# Patient Record
Sex: Female | Born: 1955
Health system: Southern US, Community
[De-identification: ages and names within clinical notes are randomized; demographics above are authoritative.]

## PROBLEM LIST (undated history)

## (undated) DIAGNOSIS — R7689 Other specified abnormal immunological findings in serum: Secondary | ICD-10-CM

## (undated) DIAGNOSIS — R768 Other specified abnormal immunological findings in serum: Secondary | ICD-10-CM

## (undated) DIAGNOSIS — R011 Cardiac murmur, unspecified: Secondary | ICD-10-CM

## (undated) DIAGNOSIS — C50919 Malignant neoplasm of unspecified site of unspecified female breast: Secondary | ICD-10-CM

## (undated) DIAGNOSIS — S81809A Unspecified open wound, unspecified lower leg, initial encounter: Secondary | ICD-10-CM

## (undated) DIAGNOSIS — J209 Acute bronchitis, unspecified: Secondary | ICD-10-CM

## (undated) DIAGNOSIS — J329 Chronic sinusitis, unspecified: Secondary | ICD-10-CM

## (undated) DIAGNOSIS — Z9889 Other specified postprocedural states: Secondary | ICD-10-CM

## (undated) DIAGNOSIS — L03119 Cellulitis of unspecified part of limb: Secondary | ICD-10-CM

## (undated) DIAGNOSIS — R220 Localized swelling, mass and lump, head: Secondary | ICD-10-CM

## (undated) DIAGNOSIS — K140 Glossitis: Secondary | ICD-10-CM

## (undated) DIAGNOSIS — K649 Unspecified hemorrhoids: Secondary | ICD-10-CM

## (undated) DIAGNOSIS — S065X9A Traumatic subdural hemorrhage with loss of consciousness of unspecified duration, initial encounter: Secondary | ICD-10-CM

## (undated) DIAGNOSIS — L02419 Cutaneous abscess of limb, unspecified: Secondary | ICD-10-CM

## (undated) DIAGNOSIS — Z86718 Personal history of other venous thrombosis and embolism: Secondary | ICD-10-CM

## (undated) DIAGNOSIS — L0201 Cutaneous abscess of face: Secondary | ICD-10-CM

## (undated) DIAGNOSIS — N83209 Unspecified ovarian cyst, unspecified side: Secondary | ICD-10-CM

## (undated) DIAGNOSIS — I422 Other hypertrophic cardiomyopathy: Secondary | ICD-10-CM

## (undated) DIAGNOSIS — Q078 Other specified congenital malformations of nervous system: Secondary | ICD-10-CM

## (undated) DIAGNOSIS — F988 Other specified behavioral and emotional disorders with onset usually occurring in childhood and adolescence: Secondary | ICD-10-CM

## (undated) DIAGNOSIS — R1013 Epigastric pain: Secondary | ICD-10-CM

## (undated) DIAGNOSIS — R221 Localized swelling, mass and lump, neck: Secondary | ICD-10-CM

## (undated) DIAGNOSIS — S81009A Unspecified open wound, unspecified knee, initial encounter: Secondary | ICD-10-CM

## (undated) DIAGNOSIS — E039 Hypothyroidism, unspecified: Secondary | ICD-10-CM

## (undated) DIAGNOSIS — T783XXA Angioneurotic edema, initial encounter: Secondary | ICD-10-CM

## (undated) DIAGNOSIS — K56609 Unspecified intestinal obstruction, unspecified as to partial versus complete obstruction: Secondary | ICD-10-CM

## (undated) DIAGNOSIS — C569 Malignant neoplasm of unspecified ovary: Secondary | ICD-10-CM

## (undated) DIAGNOSIS — K922 Gastrointestinal hemorrhage, unspecified: Secondary | ICD-10-CM

## (undated) DIAGNOSIS — R55 Syncope and collapse: Secondary | ICD-10-CM

## (undated) DIAGNOSIS — Z9010 Acquired absence of unspecified breast and nipple: Secondary | ICD-10-CM

## (undated) DIAGNOSIS — A90 Dengue fever [classical dengue]: Secondary | ICD-10-CM

## (undated) DIAGNOSIS — R51 Headache: Secondary | ICD-10-CM

## (undated) DIAGNOSIS — K589 Irritable bowel syndrome without diarrhea: Secondary | ICD-10-CM

## (undated) DIAGNOSIS — R079 Chest pain, unspecified: Secondary | ICD-10-CM

## (undated) DIAGNOSIS — Z7901 Long term (current) use of anticoagulants: Secondary | ICD-10-CM

## (undated) DIAGNOSIS — S91009A Unspecified open wound, unspecified ankle, initial encounter: Secondary | ICD-10-CM

## (undated) DIAGNOSIS — Z86010 Personal history of colonic polyps: Secondary | ICD-10-CM

## (undated) DIAGNOSIS — R062 Wheezing: Secondary | ICD-10-CM

## (undated) DIAGNOSIS — L03211 Cellulitis of face: Secondary | ICD-10-CM

## (undated) DIAGNOSIS — S065XAA Traumatic subdural hemorrhage with loss of consciousness status unknown, initial encounter: Secondary | ICD-10-CM

## (undated) DIAGNOSIS — H6980 Other specified disorders of Eustachian tube, unspecified ear: Secondary | ICD-10-CM

## (undated) DIAGNOSIS — K920 Hematemesis: Secondary | ICD-10-CM

## (undated) DIAGNOSIS — J309 Allergic rhinitis, unspecified: Secondary | ICD-10-CM

## (undated) DIAGNOSIS — F411 Generalized anxiety disorder: Secondary | ICD-10-CM

## (undated) DIAGNOSIS — R569 Unspecified convulsions: Secondary | ICD-10-CM

## (undated) DIAGNOSIS — I10 Essential (primary) hypertension: Secondary | ICD-10-CM

## (undated) DIAGNOSIS — C37 Malignant neoplasm of thymus: Secondary | ICD-10-CM

## (undated) DIAGNOSIS — E785 Hyperlipidemia, unspecified: Secondary | ICD-10-CM

## (undated) HISTORY — PX: MITRAL VALVE REPLACEMENT: SHX147

## (undated) HISTORY — PX: OTHER SURGICAL HISTORY: SHX169

## (undated) HISTORY — DX: Unspecified hemorrhoids: K64.9

## (undated) HISTORY — DX: Unspecified convulsions: R56.9

## (undated) HISTORY — DX: Traumatic subdural hemorrhage with loss of consciousness of unspecified duration, initial encounter: S06.5X9A

## (undated) HISTORY — DX: Cellulitis of unspecified part of limb: L03.119

## (undated) HISTORY — DX: Cellulitis of face: L03.211

## (undated) HISTORY — DX: Other specified abnormal immunological findings in serum: R76.89

## (undated) HISTORY — DX: Other specified congenital malformations of nervous system: Q07.8

## (undated) HISTORY — DX: Long term (current) use of anticoagulants: Z79.01

## (undated) HISTORY — DX: Glossitis: K14.0

## (undated) HISTORY — DX: Allergic rhinitis, unspecified: J30.9

## (undated) HISTORY — PX: SPIGELIAN HERNIA: SHX6100

## (undated) HISTORY — PX: CHOLECYSTECTOMY: SHX55

## (undated) HISTORY — DX: Essential (primary) hypertension: I10

## (undated) HISTORY — DX: Other hypertrophic cardiomyopathy: I42.2

## (undated) HISTORY — DX: Other specified disorders of Eustachian tube, unspecified ear: H69.80

## (undated) HISTORY — PX: CARDIAC ELECTROPHYSIOLOGY MAPPING AND ABLATION: SHX1292

## (undated) HISTORY — DX: Chronic sinusitis, unspecified: J32.9

## (undated) HISTORY — DX: Unspecified intestinal obstruction, unspecified as to partial versus complete obstruction: K56.609

## (undated) HISTORY — DX: Malignant neoplasm of thymus: C37

## (undated) HISTORY — DX: Other specified postprocedural states: Z98.890

## (undated) HISTORY — DX: Irritable bowel syndrome without diarrhea: K58.9

## (undated) HISTORY — DX: Traumatic subdural hemorrhage with loss of consciousness status unknown, initial encounter: S06.5XAA

## (undated) HISTORY — DX: Gastrointestinal hemorrhage, unspecified: K92.2

## (undated) HISTORY — DX: Syncope and collapse: R55

## (undated) HISTORY — DX: Angioneurotic edema, initial encounter: T78.3XXA

## (undated) HISTORY — PX: COLONOSCOPY W/ BIOPSIES: SHX1374

## (undated) HISTORY — DX: Localized swelling, mass and lump, head: R22.0

## (undated) HISTORY — DX: Acquired absence of unspecified breast and nipple: Z90.10

## (undated) HISTORY — DX: Hematemesis: K92.0

## (undated) HISTORY — PX: FLEXIBLE SIGMOIDOSCOPY: SHX1649

## (undated) HISTORY — PX: MINIMALLY INVASIVE TRICUSPID VALVE REPAIR: SHX5975

## (undated) HISTORY — PX: BREAST RECONSTRUCTION: SHX9

## (undated) HISTORY — DX: Personal history of other venous thrombosis and embolism: Z86.718

## (undated) HISTORY — DX: Cutaneous abscess of face: L02.01

## (undated) HISTORY — PX: APPENDECTOMY: SHX54

## (undated) HISTORY — DX: Chest pain, unspecified: R07.9

## (undated) HISTORY — DX: Cutaneous abscess of limb, unspecified: L02.419

## (undated) HISTORY — DX: Unspecified open wound, unspecified ankle, initial encounter: S91.009A

## (undated) HISTORY — DX: Malignant neoplasm of unspecified site of unspecified female breast: C50.919

## (undated) HISTORY — DX: Acute bronchitis, unspecified: J20.9

## (undated) HISTORY — PX: OOPHORECTOMY: SHX86

## (undated) HISTORY — DX: Unspecified open wound, unspecified lower leg, initial encounter: S81.809A

## (undated) HISTORY — DX: Epigastric pain: R10.13

## (undated) HISTORY — PX: MASTECTOMY: SHX3

## (undated) HISTORY — DX: Generalized anxiety disorder: F41.1

## (undated) HISTORY — DX: Unspecified open wound, unspecified knee, initial encounter: S81.009A

## (undated) HISTORY — DX: Malignant neoplasm of unspecified ovary: C56.9

## (undated) HISTORY — DX: Hypothyroidism, unspecified: E03.9

## (undated) HISTORY — DX: Wheezing: R06.2

## (undated) HISTORY — DX: Headache: R51

## (undated) HISTORY — DX: Other specified behavioral and emotional disorders with onset usually occurring in childhood and adolescence: F98.8

## (undated) HISTORY — DX: Personal history of colonic polyps: Z86.010

## (undated) HISTORY — DX: Other specified abnormal immunological findings in serum: R76.8

## (undated) HISTORY — DX: Hyperlipidemia, unspecified: E78.5

## (undated) HISTORY — DX: Localized swelling, mass and lump, neck: R22.1

## (undated) HISTORY — DX: Dengue fever (classical dengue): A90

## (undated) HISTORY — PX: ABDOMINAL HYSTERECTOMY: SHX81

## (undated) HISTORY — DX: Unspecified ovarian cyst, unspecified side: N83.209

---

## 1998-09-09 ENCOUNTER — Other Ambulatory Visit: Admission: RE | Admit: 1998-09-09 | Discharge: 1998-09-09 | Payer: Self-pay | Admitting: Obstetrics and Gynecology

## 1999-10-24 ENCOUNTER — Ambulatory Visit (HOSPITAL_COMMUNITY): Admission: RE | Admit: 1999-10-24 | Discharge: 1999-10-24 | Payer: Self-pay | Admitting: Internal Medicine

## 1999-10-24 ENCOUNTER — Encounter: Payer: Self-pay | Admitting: Internal Medicine

## 1999-11-21 ENCOUNTER — Ambulatory Visit (HOSPITAL_COMMUNITY): Admission: RE | Admit: 1999-11-21 | Discharge: 1999-11-21 | Payer: Self-pay | Admitting: Internal Medicine

## 1999-11-21 ENCOUNTER — Encounter: Payer: Self-pay | Admitting: Internal Medicine

## 2000-02-28 ENCOUNTER — Encounter (INDEPENDENT_AMBULATORY_CARE_PROVIDER_SITE_OTHER): Payer: Self-pay | Admitting: Specialist

## 2000-02-28 ENCOUNTER — Other Ambulatory Visit: Admission: RE | Admit: 2000-02-28 | Discharge: 2000-02-28 | Payer: Self-pay | Admitting: Plastic Surgery

## 2001-02-11 ENCOUNTER — Emergency Department (HOSPITAL_COMMUNITY): Admission: EM | Admit: 2001-02-11 | Discharge: 2001-02-11 | Payer: Self-pay | Admitting: Emergency Medicine

## 2001-02-27 ENCOUNTER — Other Ambulatory Visit: Admission: RE | Admit: 2001-02-27 | Discharge: 2001-02-27 | Payer: Self-pay | Admitting: Obstetrics and Gynecology

## 2002-04-28 ENCOUNTER — Other Ambulatory Visit: Admission: RE | Admit: 2002-04-28 | Discharge: 2002-04-28 | Payer: Self-pay | Admitting: Obstetrics and Gynecology

## 2002-06-10 ENCOUNTER — Encounter: Payer: Self-pay | Admitting: Internal Medicine

## 2002-06-10 ENCOUNTER — Inpatient Hospital Stay (HOSPITAL_COMMUNITY): Admission: EM | Admit: 2002-06-10 | Discharge: 2002-06-17 | Payer: Self-pay | Admitting: Emergency Medicine

## 2002-06-10 ENCOUNTER — Encounter: Payer: Self-pay | Admitting: Emergency Medicine

## 2002-06-11 ENCOUNTER — Encounter: Payer: Self-pay | Admitting: Internal Medicine

## 2002-06-11 ENCOUNTER — Encounter (INDEPENDENT_AMBULATORY_CARE_PROVIDER_SITE_OTHER): Payer: Self-pay | Admitting: *Deleted

## 2002-06-13 ENCOUNTER — Encounter: Payer: Self-pay | Admitting: General Surgery

## 2002-11-23 ENCOUNTER — Emergency Department (HOSPITAL_COMMUNITY): Admission: EM | Admit: 2002-11-23 | Discharge: 2002-11-23 | Payer: Self-pay | Admitting: Emergency Medicine

## 2002-12-29 ENCOUNTER — Ambulatory Visit (HOSPITAL_COMMUNITY): Admission: RE | Admit: 2002-12-29 | Discharge: 2002-12-29 | Payer: Self-pay | Admitting: Internal Medicine

## 2002-12-31 ENCOUNTER — Inpatient Hospital Stay (HOSPITAL_COMMUNITY): Admission: AD | Admit: 2002-12-31 | Discharge: 2003-01-02 | Payer: Self-pay | Admitting: Cardiology

## 2003-01-03 HISTORY — PX: COLON RESECTION: SHX5231

## 2003-03-03 ENCOUNTER — Encounter: Admission: RE | Admit: 2003-03-03 | Discharge: 2003-03-03 | Payer: Self-pay | Admitting: Internal Medicine

## 2003-06-01 ENCOUNTER — Emergency Department (HOSPITAL_COMMUNITY): Admission: EM | Admit: 2003-06-01 | Discharge: 2003-06-01 | Payer: Self-pay | Admitting: *Deleted

## 2003-06-18 ENCOUNTER — Ambulatory Visit (HOSPITAL_COMMUNITY): Admission: RE | Admit: 2003-06-18 | Discharge: 2003-06-18 | Payer: Self-pay | Admitting: Internal Medicine

## 2003-09-10 ENCOUNTER — Emergency Department (HOSPITAL_COMMUNITY): Admission: EM | Admit: 2003-09-10 | Discharge: 2003-09-10 | Payer: Self-pay | Admitting: Emergency Medicine

## 2003-09-11 ENCOUNTER — Ambulatory Visit (HOSPITAL_COMMUNITY): Admission: RE | Admit: 2003-09-11 | Discharge: 2003-09-11 | Payer: Self-pay | Admitting: Internal Medicine

## 2003-12-07 ENCOUNTER — Ambulatory Visit: Payer: Self-pay | Admitting: Internal Medicine

## 2004-01-28 ENCOUNTER — Ambulatory Visit: Payer: Self-pay | Admitting: Internal Medicine

## 2004-01-29 ENCOUNTER — Encounter: Admission: RE | Admit: 2004-01-29 | Discharge: 2004-01-29 | Payer: Self-pay | Admitting: Internal Medicine

## 2004-01-31 ENCOUNTER — Emergency Department (HOSPITAL_COMMUNITY): Admission: EM | Admit: 2004-01-31 | Discharge: 2004-02-01 | Payer: Self-pay | Admitting: Emergency Medicine

## 2004-02-01 ENCOUNTER — Ambulatory Visit: Payer: Self-pay | Admitting: Internal Medicine

## 2004-02-01 ENCOUNTER — Inpatient Hospital Stay (HOSPITAL_COMMUNITY): Admission: EM | Admit: 2004-02-01 | Discharge: 2004-02-04 | Payer: Self-pay | Admitting: Internal Medicine

## 2004-02-04 ENCOUNTER — Encounter (INDEPENDENT_AMBULATORY_CARE_PROVIDER_SITE_OTHER): Payer: Self-pay | Admitting: Specialist

## 2004-04-30 ENCOUNTER — Emergency Department (HOSPITAL_COMMUNITY): Admission: EM | Admit: 2004-04-30 | Discharge: 2004-04-30 | Payer: Self-pay | Admitting: Emergency Medicine

## 2004-05-05 ENCOUNTER — Ambulatory Visit: Payer: Self-pay | Admitting: Internal Medicine

## 2004-06-21 ENCOUNTER — Emergency Department (HOSPITAL_COMMUNITY): Admission: EM | Admit: 2004-06-21 | Discharge: 2004-06-21 | Payer: Self-pay | Admitting: Emergency Medicine

## 2004-06-23 ENCOUNTER — Ambulatory Visit: Payer: Self-pay | Admitting: Internal Medicine

## 2004-06-28 ENCOUNTER — Ambulatory Visit (HOSPITAL_COMMUNITY): Admission: RE | Admit: 2004-06-28 | Discharge: 2004-06-28 | Payer: Self-pay | Admitting: Internal Medicine

## 2004-06-30 ENCOUNTER — Ambulatory Visit: Payer: Self-pay | Admitting: Internal Medicine

## 2004-07-07 ENCOUNTER — Encounter (HOSPITAL_COMMUNITY): Admission: RE | Admit: 2004-07-07 | Discharge: 2004-10-05 | Payer: Self-pay | Admitting: Internal Medicine

## 2004-07-11 ENCOUNTER — Ambulatory Visit: Payer: Self-pay | Admitting: Internal Medicine

## 2004-08-09 ENCOUNTER — Ambulatory Visit (HOSPITAL_COMMUNITY): Admission: RE | Admit: 2004-08-09 | Discharge: 2004-08-09 | Payer: Self-pay | Admitting: Internal Medicine

## 2004-08-31 ENCOUNTER — Ambulatory Visit: Payer: Self-pay | Admitting: Internal Medicine

## 2004-09-08 ENCOUNTER — Emergency Department (HOSPITAL_COMMUNITY): Admission: EM | Admit: 2004-09-08 | Discharge: 2004-09-09 | Payer: Self-pay | Admitting: Emergency Medicine

## 2004-09-26 ENCOUNTER — Ambulatory Visit: Payer: Self-pay | Admitting: Internal Medicine

## 2004-10-05 ENCOUNTER — Ambulatory Visit: Payer: Self-pay | Admitting: Internal Medicine

## 2004-10-11 ENCOUNTER — Ambulatory Visit (HOSPITAL_COMMUNITY): Admission: RE | Admit: 2004-10-11 | Discharge: 2004-10-11 | Payer: Self-pay | Admitting: Internal Medicine

## 2004-10-21 ENCOUNTER — Ambulatory Visit: Payer: Self-pay | Admitting: Internal Medicine

## 2004-12-13 ENCOUNTER — Ambulatory Visit: Payer: Self-pay | Admitting: Internal Medicine

## 2005-01-04 ENCOUNTER — Ambulatory Visit: Payer: Self-pay | Admitting: Gastroenterology

## 2005-01-04 ENCOUNTER — Inpatient Hospital Stay (HOSPITAL_COMMUNITY): Admission: EM | Admit: 2005-01-04 | Discharge: 2005-01-06 | Payer: Self-pay | Admitting: Emergency Medicine

## 2005-01-05 ENCOUNTER — Encounter (INDEPENDENT_AMBULATORY_CARE_PROVIDER_SITE_OTHER): Payer: Self-pay | Admitting: *Deleted

## 2005-01-05 ENCOUNTER — Encounter: Payer: Self-pay | Admitting: Internal Medicine

## 2005-01-12 ENCOUNTER — Ambulatory Visit: Payer: Self-pay | Admitting: Internal Medicine

## 2005-02-01 ENCOUNTER — Ambulatory Visit: Payer: Self-pay | Admitting: Internal Medicine

## 2005-04-28 ENCOUNTER — Ambulatory Visit (HOSPITAL_COMMUNITY): Admission: RE | Admit: 2005-04-28 | Discharge: 2005-04-28 | Payer: Self-pay | Admitting: Internal Medicine

## 2005-04-28 ENCOUNTER — Ambulatory Visit: Payer: Self-pay | Admitting: Internal Medicine

## 2005-05-30 ENCOUNTER — Ambulatory Visit: Payer: Self-pay | Admitting: Internal Medicine

## 2005-05-30 ENCOUNTER — Encounter: Admission: RE | Admit: 2005-05-30 | Discharge: 2005-05-30 | Payer: Self-pay | Admitting: Internal Medicine

## 2005-06-08 ENCOUNTER — Ambulatory Visit: Payer: Self-pay | Admitting: Internal Medicine

## 2005-06-15 ENCOUNTER — Ambulatory Visit: Payer: Self-pay | Admitting: Internal Medicine

## 2005-07-07 ENCOUNTER — Ambulatory Visit (HOSPITAL_COMMUNITY): Admission: RE | Admit: 2005-07-07 | Discharge: 2005-07-07 | Payer: Self-pay | Admitting: Internal Medicine

## 2005-08-03 ENCOUNTER — Ambulatory Visit: Payer: Self-pay | Admitting: Internal Medicine

## 2005-08-30 ENCOUNTER — Inpatient Hospital Stay (HOSPITAL_COMMUNITY): Admission: EM | Admit: 2005-08-30 | Discharge: 2005-09-02 | Payer: Self-pay | Admitting: Emergency Medicine

## 2005-08-30 ENCOUNTER — Ambulatory Visit: Payer: Self-pay | Admitting: Internal Medicine

## 2005-09-05 ENCOUNTER — Ambulatory Visit: Payer: Self-pay | Admitting: Internal Medicine

## 2005-10-05 ENCOUNTER — Ambulatory Visit: Payer: Self-pay | Admitting: Internal Medicine

## 2005-11-02 ENCOUNTER — Inpatient Hospital Stay (HOSPITAL_COMMUNITY): Admission: EM | Admit: 2005-11-02 | Discharge: 2005-11-06 | Payer: Self-pay | Admitting: Emergency Medicine

## 2005-11-05 ENCOUNTER — Encounter: Payer: Self-pay | Admitting: Gastroenterology

## 2005-11-06 ENCOUNTER — Ambulatory Visit: Payer: Self-pay | Admitting: Internal Medicine

## 2005-11-08 ENCOUNTER — Ambulatory Visit: Payer: Self-pay | Admitting: Internal Medicine

## 2005-12-01 ENCOUNTER — Inpatient Hospital Stay (HOSPITAL_COMMUNITY): Admission: EM | Admit: 2005-12-01 | Discharge: 2005-12-03 | Payer: Self-pay | Admitting: Emergency Medicine

## 2005-12-01 ENCOUNTER — Encounter (INDEPENDENT_AMBULATORY_CARE_PROVIDER_SITE_OTHER): Payer: Self-pay | Admitting: *Deleted

## 2005-12-01 ENCOUNTER — Ambulatory Visit (HOSPITAL_COMMUNITY): Admission: RE | Admit: 2005-12-01 | Discharge: 2005-12-01 | Payer: Self-pay | Admitting: General Surgery

## 2005-12-01 ENCOUNTER — Ambulatory Visit: Payer: Self-pay | Admitting: Internal Medicine

## 2005-12-22 ENCOUNTER — Ambulatory Visit: Payer: Self-pay | Admitting: Internal Medicine

## 2006-01-20 ENCOUNTER — Ambulatory Visit: Payer: Self-pay | Admitting: Family Medicine

## 2006-03-13 ENCOUNTER — Ambulatory Visit: Payer: Self-pay | Admitting: Internal Medicine

## 2006-04-02 ENCOUNTER — Ambulatory Visit: Payer: Self-pay | Admitting: Internal Medicine

## 2006-04-10 ENCOUNTER — Ambulatory Visit: Payer: Self-pay | Admitting: Internal Medicine

## 2006-04-12 ENCOUNTER — Inpatient Hospital Stay (HOSPITAL_COMMUNITY): Admission: AD | Admit: 2006-04-12 | Discharge: 2006-04-15 | Payer: Self-pay | Admitting: Internal Medicine

## 2006-04-12 ENCOUNTER — Ambulatory Visit: Payer: Self-pay | Admitting: Internal Medicine

## 2006-04-14 ENCOUNTER — Ambulatory Visit: Payer: Self-pay | Admitting: Internal Medicine

## 2006-05-10 ENCOUNTER — Ambulatory Visit: Payer: Self-pay | Admitting: Internal Medicine

## 2006-05-20 ENCOUNTER — Emergency Department (HOSPITAL_COMMUNITY): Admission: EM | Admit: 2006-05-20 | Discharge: 2006-05-20 | Payer: Self-pay | Admitting: Emergency Medicine

## 2006-05-20 ENCOUNTER — Encounter (INDEPENDENT_AMBULATORY_CARE_PROVIDER_SITE_OTHER): Payer: Self-pay | Admitting: Emergency Medicine

## 2006-05-20 ENCOUNTER — Ambulatory Visit: Payer: Self-pay | Admitting: Vascular Surgery

## 2006-05-21 ENCOUNTER — Ambulatory Visit: Payer: Self-pay | Admitting: Internal Medicine

## 2006-05-23 ENCOUNTER — Ambulatory Visit (HOSPITAL_COMMUNITY): Admission: RE | Admit: 2006-05-23 | Discharge: 2006-05-23 | Payer: Self-pay | Admitting: Internal Medicine

## 2006-05-24 ENCOUNTER — Ambulatory Visit: Payer: Self-pay | Admitting: Internal Medicine

## 2006-05-24 ENCOUNTER — Observation Stay (HOSPITAL_COMMUNITY): Admission: EM | Admit: 2006-05-24 | Discharge: 2006-05-25 | Payer: Self-pay | Admitting: Emergency Medicine

## 2006-05-29 ENCOUNTER — Ambulatory Visit: Payer: Self-pay | Admitting: Cardiology

## 2006-05-29 LAB — CONVERTED CEMR LAB
INR: 1.1 (ref 0.9–2.0)
Prothrombin Time: 12.6 s (ref 10.0–14.0)

## 2006-06-01 ENCOUNTER — Ambulatory Visit: Payer: Self-pay | Admitting: Internal Medicine

## 2006-06-01 ENCOUNTER — Ambulatory Visit: Payer: Self-pay | Admitting: Cardiology

## 2006-06-01 LAB — CONVERTED CEMR LAB
INR: 1.1
Prothrombin Time: 12.8 s

## 2006-06-06 ENCOUNTER — Ambulatory Visit: Payer: Self-pay | Admitting: Internal Medicine

## 2006-06-07 ENCOUNTER — Ambulatory Visit: Payer: Self-pay | Admitting: Internal Medicine

## 2006-06-26 ENCOUNTER — Ambulatory Visit: Payer: Self-pay | Admitting: Internal Medicine

## 2006-07-04 ENCOUNTER — Ambulatory Visit: Payer: Self-pay | Admitting: Internal Medicine

## 2006-07-07 ENCOUNTER — Ambulatory Visit: Payer: Self-pay | Admitting: Family Medicine

## 2006-07-26 ENCOUNTER — Ambulatory Visit: Payer: Self-pay | Admitting: Internal Medicine

## 2006-07-26 LAB — CONVERTED CEMR LAB
Basophils Absolute: 0.1 10*3/uL (ref 0.0–0.1)
Eosinophils Absolute: 0.2 10*3/uL (ref 0.0–0.6)
Eosinophils Relative: 4.8 % (ref 0.0–5.0)
Lymphocytes Relative: 33.2 % (ref 12.0–46.0)
MCHC: 34.9 g/dL (ref 30.0–36.0)
MCV: 89.6 fL (ref 78.0–100.0)
Monocytes Relative: 6.6 % (ref 3.0–11.0)
Neutro Abs: 2.3 10*3/uL (ref 1.4–7.7)
Platelets: 236 10*3/uL (ref 150–400)

## 2006-07-31 ENCOUNTER — Ambulatory Visit: Payer: Self-pay | Admitting: Internal Medicine

## 2006-08-21 ENCOUNTER — Ambulatory Visit: Payer: Self-pay | Admitting: Internal Medicine

## 2006-08-29 ENCOUNTER — Ambulatory Visit: Payer: Self-pay | Admitting: Internal Medicine

## 2006-08-29 LAB — CONVERTED CEMR LAB
Basophils Relative: 0.8 % (ref 0.0–1.0)
Eosinophils Absolute: 0.2 10*3/uL (ref 0.0–0.6)
Eosinophils Relative: 4.3 % (ref 0.0–5.0)
HCT: 37.3 % (ref 36.0–46.0)
MCHC: 35 g/dL (ref 30.0–36.0)
Monocytes Absolute: 0.4 10*3/uL (ref 0.2–0.7)
Neutro Abs: 2.9 10*3/uL (ref 1.4–7.7)
RBC: 4.21 M/uL (ref 3.87–5.11)
WBC: 5.4 10*3/uL (ref 4.5–10.5)

## 2006-09-26 ENCOUNTER — Ambulatory Visit: Payer: Self-pay | Admitting: Internal Medicine

## 2006-09-28 ENCOUNTER — Ambulatory Visit: Payer: Self-pay | Admitting: Internal Medicine

## 2006-10-09 ENCOUNTER — Encounter: Admission: RE | Admit: 2006-10-09 | Discharge: 2006-10-09 | Payer: Self-pay | Admitting: Internal Medicine

## 2006-10-15 ENCOUNTER — Encounter: Payer: Self-pay | Admitting: Internal Medicine

## 2006-10-15 ENCOUNTER — Ambulatory Visit: Payer: Self-pay | Admitting: Internal Medicine

## 2006-10-15 DIAGNOSIS — J309 Allergic rhinitis, unspecified: Secondary | ICD-10-CM | POA: Insufficient documentation

## 2006-10-15 DIAGNOSIS — N83209 Unspecified ovarian cyst, unspecified side: Secondary | ICD-10-CM

## 2006-10-15 DIAGNOSIS — Z860100 Personal history of colon polyps, unspecified: Secondary | ICD-10-CM

## 2006-10-15 DIAGNOSIS — F411 Generalized anxiety disorder: Secondary | ICD-10-CM

## 2006-10-15 DIAGNOSIS — Z86718 Personal history of other venous thrombosis and embolism: Secondary | ICD-10-CM | POA: Insufficient documentation

## 2006-10-15 DIAGNOSIS — C50919 Malignant neoplasm of unspecified site of unspecified female breast: Secondary | ICD-10-CM

## 2006-10-15 DIAGNOSIS — I10 Essential (primary) hypertension: Secondary | ICD-10-CM | POA: Insufficient documentation

## 2006-10-15 DIAGNOSIS — Q078 Other specified congenital malformations of nervous system: Secondary | ICD-10-CM | POA: Insufficient documentation

## 2006-10-15 DIAGNOSIS — Z9889 Other specified postprocedural states: Secondary | ICD-10-CM

## 2006-10-15 DIAGNOSIS — C37 Malignant neoplasm of thymus: Secondary | ICD-10-CM | POA: Insufficient documentation

## 2006-10-15 DIAGNOSIS — Z8719 Personal history of other diseases of the digestive system: Secondary | ICD-10-CM | POA: Insufficient documentation

## 2006-10-15 DIAGNOSIS — E65 Localized adiposity: Secondary | ICD-10-CM | POA: Insufficient documentation

## 2006-10-15 DIAGNOSIS — F329 Major depressive disorder, single episode, unspecified: Secondary | ICD-10-CM

## 2006-10-15 DIAGNOSIS — F32A Depression, unspecified: Secondary | ICD-10-CM | POA: Insufficient documentation

## 2006-10-15 DIAGNOSIS — J019 Acute sinusitis, unspecified: Secondary | ICD-10-CM | POA: Insufficient documentation

## 2006-10-15 HISTORY — DX: Malignant neoplasm of thymus: C37

## 2006-10-15 HISTORY — DX: Other specified congenital malformations of nervous system: Q07.8

## 2006-10-15 HISTORY — DX: Malignant neoplasm of unspecified site of unspecified female breast: C50.919

## 2006-10-15 HISTORY — DX: Allergic rhinitis, unspecified: J30.9

## 2006-10-15 HISTORY — DX: Other specified postprocedural states: Z98.890

## 2006-10-15 HISTORY — DX: Generalized anxiety disorder: F41.1

## 2006-10-15 HISTORY — DX: Personal history of colonic polyps: Z86.010

## 2006-10-15 HISTORY — DX: Personal history of colon polyps, unspecified: Z86.0100

## 2006-10-15 HISTORY — DX: Essential (primary) hypertension: I10

## 2006-10-15 HISTORY — DX: Personal history of other venous thrombosis and embolism: Z86.718

## 2006-10-15 HISTORY — DX: Unspecified ovarian cyst, unspecified side: N83.209

## 2006-10-20 ENCOUNTER — Emergency Department (HOSPITAL_COMMUNITY): Admission: EM | Admit: 2006-10-20 | Discharge: 2006-10-20 | Payer: Self-pay | Admitting: *Deleted

## 2006-10-22 ENCOUNTER — Ambulatory Visit: Payer: Self-pay | Admitting: Internal Medicine

## 2006-10-22 LAB — CONVERTED CEMR LAB
ALT: 23 units/L (ref 0–35)
Bilirubin, Direct: 0.1 mg/dL (ref 0.0–0.3)
CO2: 27 meq/L (ref 19–32)
Calcium: 9.6 mg/dL (ref 8.4–10.5)
GFR calc Af Amer: 75 mL/min
GFR calc non Af Amer: 62 mL/min
Glucose, Bld: 104 mg/dL — ABNORMAL HIGH (ref 70–99)
H Pylori IgG: NEGATIVE
Lipase: 18 units/L (ref 11.0–59.0)
Potassium: 4.6 meq/L (ref 3.5–5.1)
Total Protein: 6.5 g/dL (ref 6.0–8.3)

## 2006-10-23 ENCOUNTER — Encounter: Payer: Self-pay | Admitting: Internal Medicine

## 2006-10-23 DIAGNOSIS — R1011 Right upper quadrant pain: Secondary | ICD-10-CM | POA: Insufficient documentation

## 2006-10-25 ENCOUNTER — Ambulatory Visit (HOSPITAL_COMMUNITY): Admission: RE | Admit: 2006-10-25 | Discharge: 2006-10-25 | Payer: Self-pay | Admitting: Internal Medicine

## 2006-11-08 ENCOUNTER — Telehealth (INDEPENDENT_AMBULATORY_CARE_PROVIDER_SITE_OTHER): Payer: Self-pay | Admitting: *Deleted

## 2006-11-12 ENCOUNTER — Encounter: Payer: Self-pay | Admitting: Internal Medicine

## 2006-11-20 ENCOUNTER — Ambulatory Visit: Payer: Self-pay | Admitting: Internal Medicine

## 2006-11-20 DIAGNOSIS — R1031 Right lower quadrant pain: Secondary | ICD-10-CM | POA: Insufficient documentation

## 2006-12-03 ENCOUNTER — Observation Stay (HOSPITAL_COMMUNITY): Admission: EM | Admit: 2006-12-03 | Discharge: 2006-12-05 | Payer: Self-pay | Admitting: Emergency Medicine

## 2006-12-03 ENCOUNTER — Ambulatory Visit: Payer: Self-pay | Admitting: Cardiology

## 2006-12-04 ENCOUNTER — Encounter: Payer: Self-pay | Admitting: Internal Medicine

## 2006-12-04 ENCOUNTER — Encounter: Payer: Self-pay | Admitting: Cardiology

## 2006-12-13 ENCOUNTER — Ambulatory Visit: Payer: Self-pay | Admitting: Internal Medicine

## 2006-12-21 ENCOUNTER — Telehealth: Payer: Self-pay | Admitting: Internal Medicine

## 2006-12-24 ENCOUNTER — Ambulatory Visit (HOSPITAL_COMMUNITY): Admission: RE | Admit: 2006-12-24 | Discharge: 2006-12-24 | Payer: Self-pay | Admitting: Internal Medicine

## 2006-12-29 ENCOUNTER — Encounter: Payer: Self-pay | Admitting: Internal Medicine

## 2006-12-31 ENCOUNTER — Telehealth (INDEPENDENT_AMBULATORY_CARE_PROVIDER_SITE_OTHER): Payer: Self-pay | Admitting: *Deleted

## 2007-01-15 ENCOUNTER — Telehealth (INDEPENDENT_AMBULATORY_CARE_PROVIDER_SITE_OTHER): Payer: Self-pay | Admitting: *Deleted

## 2007-01-17 ENCOUNTER — Ambulatory Visit: Payer: Self-pay | Admitting: Internal Medicine

## 2007-01-17 DIAGNOSIS — R42 Dizziness and giddiness: Secondary | ICD-10-CM | POA: Insufficient documentation

## 2007-01-17 DIAGNOSIS — G238 Other specified degenerative diseases of basal ganglia: Secondary | ICD-10-CM | POA: Insufficient documentation

## 2007-01-21 ENCOUNTER — Encounter
Admission: RE | Admit: 2007-01-21 | Discharge: 2007-01-21 | Payer: Self-pay | Admitting: Physical Medicine & Rehabilitation

## 2007-02-04 ENCOUNTER — Ambulatory Visit: Payer: Self-pay | Admitting: Internal Medicine

## 2007-02-11 ENCOUNTER — Encounter: Payer: Self-pay | Admitting: Internal Medicine

## 2007-02-19 ENCOUNTER — Encounter: Payer: Self-pay | Admitting: Internal Medicine

## 2007-02-23 ENCOUNTER — Telehealth: Payer: Self-pay | Admitting: Internal Medicine

## 2007-03-07 ENCOUNTER — Telehealth: Payer: Self-pay | Admitting: Internal Medicine

## 2007-03-08 ENCOUNTER — Ambulatory Visit: Payer: Self-pay | Admitting: Internal Medicine

## 2007-03-11 ENCOUNTER — Encounter: Payer: Self-pay | Admitting: Internal Medicine

## 2007-03-18 ENCOUNTER — Telehealth (INDEPENDENT_AMBULATORY_CARE_PROVIDER_SITE_OTHER): Payer: Self-pay | Admitting: *Deleted

## 2007-03-19 ENCOUNTER — Ambulatory Visit: Payer: Self-pay | Admitting: Internal Medicine

## 2007-03-22 ENCOUNTER — Ambulatory Visit: Payer: Self-pay | Admitting: Internal Medicine

## 2007-04-04 DIAGNOSIS — K589 Irritable bowel syndrome without diarrhea: Secondary | ICD-10-CM | POA: Insufficient documentation

## 2007-04-04 DIAGNOSIS — R519 Headache, unspecified: Secondary | ICD-10-CM | POA: Insufficient documentation

## 2007-04-04 DIAGNOSIS — D126 Benign neoplasm of colon, unspecified: Secondary | ICD-10-CM | POA: Insufficient documentation

## 2007-04-04 DIAGNOSIS — K922 Gastrointestinal hemorrhage, unspecified: Secondary | ICD-10-CM

## 2007-04-04 DIAGNOSIS — K625 Hemorrhage of anus and rectum: Secondary | ICD-10-CM | POA: Insufficient documentation

## 2007-04-04 DIAGNOSIS — K649 Unspecified hemorrhoids: Secondary | ICD-10-CM

## 2007-04-04 DIAGNOSIS — R51 Headache: Secondary | ICD-10-CM

## 2007-04-04 DIAGNOSIS — I82409 Acute embolism and thrombosis of unspecified deep veins of unspecified lower extremity: Secondary | ICD-10-CM | POA: Insufficient documentation

## 2007-04-04 DIAGNOSIS — F341 Dysthymic disorder: Secondary | ICD-10-CM | POA: Insufficient documentation

## 2007-04-04 HISTORY — DX: Gastrointestinal hemorrhage, unspecified: K92.2

## 2007-04-04 HISTORY — DX: Headache: R51

## 2007-04-04 HISTORY — DX: Unspecified hemorrhoids: K64.9

## 2007-04-04 HISTORY — DX: Irritable bowel syndrome, unspecified: K58.9

## 2007-05-03 ENCOUNTER — Ambulatory Visit: Payer: Self-pay | Admitting: Internal Medicine

## 2007-05-06 ENCOUNTER — Ambulatory Visit: Payer: Self-pay | Admitting: Internal Medicine

## 2007-05-22 ENCOUNTER — Telehealth (INDEPENDENT_AMBULATORY_CARE_PROVIDER_SITE_OTHER): Payer: Self-pay | Admitting: *Deleted

## 2007-05-29 ENCOUNTER — Ambulatory Visit: Payer: Self-pay | Admitting: Internal Medicine

## 2007-06-12 ENCOUNTER — Ambulatory Visit: Payer: Self-pay | Admitting: *Deleted

## 2007-06-12 ENCOUNTER — Emergency Department (HOSPITAL_COMMUNITY): Admission: EM | Admit: 2007-06-12 | Discharge: 2007-06-12 | Payer: Self-pay | Admitting: Emergency Medicine

## 2007-06-12 ENCOUNTER — Ambulatory Visit: Payer: Self-pay | Admitting: Cardiovascular Disease

## 2007-06-12 ENCOUNTER — Encounter (INDEPENDENT_AMBULATORY_CARE_PROVIDER_SITE_OTHER): Payer: Self-pay | Admitting: Emergency Medicine

## 2007-06-13 ENCOUNTER — Telehealth (INDEPENDENT_AMBULATORY_CARE_PROVIDER_SITE_OTHER): Payer: Self-pay | Admitting: *Deleted

## 2007-06-14 ENCOUNTER — Ambulatory Visit: Payer: Self-pay | Admitting: Internal Medicine

## 2007-06-14 DIAGNOSIS — R079 Chest pain, unspecified: Secondary | ICD-10-CM | POA: Insufficient documentation

## 2007-06-14 HISTORY — DX: Chest pain, unspecified: R07.9

## 2007-06-27 ENCOUNTER — Encounter: Payer: Self-pay | Admitting: Internal Medicine

## 2007-06-28 ENCOUNTER — Ambulatory Visit: Payer: Self-pay | Admitting: Internal Medicine

## 2007-07-01 ENCOUNTER — Ambulatory Visit: Payer: Self-pay | Admitting: Internal Medicine

## 2007-07-01 DIAGNOSIS — R220 Localized swelling, mass and lump, head: Secondary | ICD-10-CM

## 2007-07-01 DIAGNOSIS — R221 Localized swelling, mass and lump, neck: Secondary | ICD-10-CM

## 2007-07-01 DIAGNOSIS — R55 Syncope and collapse: Secondary | ICD-10-CM

## 2007-07-01 HISTORY — DX: Localized swelling, mass and lump, head: R22.0

## 2007-07-01 HISTORY — DX: Syncope and collapse: R55

## 2007-07-03 ENCOUNTER — Ambulatory Visit: Payer: Self-pay | Admitting: Cardiology

## 2007-07-09 ENCOUNTER — Ambulatory Visit: Payer: Self-pay | Admitting: Internal Medicine

## 2007-07-16 ENCOUNTER — Ambulatory Visit: Payer: Self-pay | Admitting: Internal Medicine

## 2007-07-16 ENCOUNTER — Ambulatory Visit: Payer: Self-pay

## 2007-07-16 ENCOUNTER — Encounter: Payer: Self-pay | Admitting: Internal Medicine

## 2007-07-16 LAB — CONVERTED CEMR LAB
Cholesterol: 247 mg/dL (ref 0–200)
Triglycerides: 305 mg/dL (ref 0–149)

## 2007-07-17 ENCOUNTER — Ambulatory Visit: Payer: Self-pay | Admitting: Internal Medicine

## 2007-07-19 ENCOUNTER — Telehealth (INDEPENDENT_AMBULATORY_CARE_PROVIDER_SITE_OTHER): Payer: Self-pay | Admitting: *Deleted

## 2007-07-25 ENCOUNTER — Encounter: Payer: Self-pay | Admitting: Internal Medicine

## 2007-07-26 ENCOUNTER — Telehealth (INDEPENDENT_AMBULATORY_CARE_PROVIDER_SITE_OTHER): Payer: Self-pay | Admitting: *Deleted

## 2007-07-26 ENCOUNTER — Ambulatory Visit: Payer: Self-pay | Admitting: Internal Medicine

## 2007-07-29 ENCOUNTER — Ambulatory Visit: Payer: Self-pay | Admitting: Internal Medicine

## 2007-07-29 DIAGNOSIS — J069 Acute upper respiratory infection, unspecified: Secondary | ICD-10-CM | POA: Insufficient documentation

## 2007-08-10 ENCOUNTER — Telehealth: Payer: Self-pay | Admitting: Internal Medicine

## 2007-08-11 ENCOUNTER — Emergency Department (HOSPITAL_COMMUNITY): Admission: EM | Admit: 2007-08-11 | Discharge: 2007-08-11 | Payer: Self-pay | Admitting: Family Medicine

## 2007-08-11 ENCOUNTER — Telehealth: Payer: Self-pay | Admitting: Internal Medicine

## 2007-08-12 ENCOUNTER — Ambulatory Visit: Payer: Self-pay | Admitting: Internal Medicine

## 2007-08-12 ENCOUNTER — Inpatient Hospital Stay (HOSPITAL_COMMUNITY): Admission: AD | Admit: 2007-08-12 | Discharge: 2007-08-14 | Payer: Self-pay | Admitting: Internal Medicine

## 2007-08-12 DIAGNOSIS — L03119 Cellulitis of unspecified part of limb: Secondary | ICD-10-CM

## 2007-08-12 DIAGNOSIS — L02419 Cutaneous abscess of limb, unspecified: Secondary | ICD-10-CM

## 2007-08-12 HISTORY — DX: Cellulitis of unspecified part of limb: L02.419

## 2007-08-16 ENCOUNTER — Ambulatory Visit: Payer: Self-pay | Admitting: Internal Medicine

## 2007-08-16 DIAGNOSIS — S91009A Unspecified open wound, unspecified ankle, initial encounter: Secondary | ICD-10-CM

## 2007-08-16 DIAGNOSIS — S81009A Unspecified open wound, unspecified knee, initial encounter: Secondary | ICD-10-CM | POA: Insufficient documentation

## 2007-08-16 DIAGNOSIS — S81809A Unspecified open wound, unspecified lower leg, initial encounter: Secondary | ICD-10-CM | POA: Insufficient documentation

## 2007-08-16 DIAGNOSIS — H9209 Otalgia, unspecified ear: Secondary | ICD-10-CM | POA: Insufficient documentation

## 2007-08-16 HISTORY — DX: Unspecified open wound, unspecified knee, initial encounter: S81.009A

## 2007-08-20 ENCOUNTER — Encounter: Payer: Self-pay | Admitting: Internal Medicine

## 2007-08-20 ENCOUNTER — Telehealth: Payer: Self-pay | Admitting: Internal Medicine

## 2007-09-16 ENCOUNTER — Encounter: Payer: Self-pay | Admitting: Internal Medicine

## 2007-10-02 ENCOUNTER — Ambulatory Visit: Payer: Self-pay | Admitting: Internal Medicine

## 2007-10-07 ENCOUNTER — Telehealth (INDEPENDENT_AMBULATORY_CARE_PROVIDER_SITE_OTHER): Payer: Self-pay | Admitting: *Deleted

## 2007-10-15 ENCOUNTER — Ambulatory Visit: Payer: Self-pay | Admitting: Obstetrics and Gynecology

## 2007-10-15 ENCOUNTER — Other Ambulatory Visit: Admission: RE | Admit: 2007-10-15 | Discharge: 2007-10-15 | Payer: Self-pay | Admitting: Obstetrics and Gynecology

## 2007-10-15 ENCOUNTER — Encounter: Payer: Self-pay | Admitting: Obstetrics and Gynecology

## 2007-10-18 ENCOUNTER — Ambulatory Visit: Payer: Self-pay | Admitting: Internal Medicine

## 2007-10-24 ENCOUNTER — Telehealth (INDEPENDENT_AMBULATORY_CARE_PROVIDER_SITE_OTHER): Payer: Self-pay | Admitting: *Deleted

## 2007-10-24 ENCOUNTER — Telehealth: Payer: Self-pay | Admitting: Internal Medicine

## 2007-10-25 ENCOUNTER — Ambulatory Visit: Payer: Self-pay | Admitting: Internal Medicine

## 2007-10-25 DIAGNOSIS — K920 Hematemesis: Secondary | ICD-10-CM | POA: Insufficient documentation

## 2007-10-25 DIAGNOSIS — R1013 Epigastric pain: Secondary | ICD-10-CM | POA: Insufficient documentation

## 2007-10-25 HISTORY — DX: Epigastric pain: R10.13

## 2007-10-25 HISTORY — DX: Hematemesis: K92.0

## 2007-10-25 LAB — CONVERTED CEMR LAB
Eosinophils Absolute: 0.3 10*3/uL (ref 0.0–0.7)
HCT: 38.3 % (ref 36.0–46.0)
MCV: 87.6 fL (ref 78.0–100.0)
Monocytes Absolute: 0.3 10*3/uL (ref 0.1–1.0)
Platelets: 265 10*3/uL (ref 150–400)
RDW: 12.4 % (ref 11.5–14.6)

## 2007-10-31 ENCOUNTER — Encounter (INDEPENDENT_AMBULATORY_CARE_PROVIDER_SITE_OTHER): Payer: Self-pay | Admitting: *Deleted

## 2007-11-04 LAB — CONVERTED CEMR LAB: Pap Smear: NORMAL

## 2007-11-06 ENCOUNTER — Telehealth (INDEPENDENT_AMBULATORY_CARE_PROVIDER_SITE_OTHER): Payer: Self-pay | Admitting: *Deleted

## 2007-11-20 ENCOUNTER — Ambulatory Visit: Payer: Self-pay | Admitting: Internal Medicine

## 2007-11-21 ENCOUNTER — Ambulatory Visit: Payer: Self-pay | Admitting: Internal Medicine

## 2007-12-12 ENCOUNTER — Ambulatory Visit: Payer: Self-pay | Admitting: Internal Medicine

## 2007-12-12 DIAGNOSIS — E785 Hyperlipidemia, unspecified: Secondary | ICD-10-CM

## 2007-12-12 DIAGNOSIS — H6690 Otitis media, unspecified, unspecified ear: Secondary | ICD-10-CM | POA: Insufficient documentation

## 2007-12-12 HISTORY — DX: Hyperlipidemia, unspecified: E78.5

## 2007-12-18 ENCOUNTER — Telehealth (INDEPENDENT_AMBULATORY_CARE_PROVIDER_SITE_OTHER): Payer: Self-pay | Admitting: *Deleted

## 2007-12-19 ENCOUNTER — Encounter: Payer: Self-pay | Admitting: Internal Medicine

## 2008-01-02 ENCOUNTER — Telehealth (INDEPENDENT_AMBULATORY_CARE_PROVIDER_SITE_OTHER): Payer: Self-pay | Admitting: *Deleted

## 2008-01-06 ENCOUNTER — Ambulatory Visit: Payer: Self-pay | Admitting: Internal Medicine

## 2008-01-27 ENCOUNTER — Ambulatory Visit: Payer: Self-pay | Admitting: Internal Medicine

## 2008-01-27 DIAGNOSIS — J209 Acute bronchitis, unspecified: Secondary | ICD-10-CM | POA: Insufficient documentation

## 2008-01-27 HISTORY — DX: Acute bronchitis, unspecified: J20.9

## 2008-01-30 ENCOUNTER — Ambulatory Visit: Payer: Self-pay | Admitting: Internal Medicine

## 2008-02-04 ENCOUNTER — Ambulatory Visit: Payer: Self-pay

## 2008-02-11 ENCOUNTER — Ambulatory Visit: Payer: Self-pay | Admitting: Internal Medicine

## 2008-02-12 LAB — CONVERTED CEMR LAB
ALT: 26 units/L (ref 0–35)
Alkaline Phosphatase: 106 units/L (ref 39–117)
Bacteria, UA: NEGATIVE
Basophils Relative: 0.8 % (ref 0.0–3.0)
Bilirubin, Direct: 0.1 mg/dL (ref 0.0–0.3)
CO2: 28 meq/L (ref 19–32)
Chloride: 103 meq/L (ref 96–112)
Cholesterol: 155 mg/dL (ref 0–200)
Eosinophils Absolute: 0.2 10*3/uL (ref 0.0–0.7)
Eosinophils Relative: 2.8 % (ref 0.0–5.0)
Folate: >20 ng/mL
Glucose, Bld: 104 mg/dL — ABNORMAL HIGH (ref 70–99)
LDL Cholesterol: 73 mg/dL (ref 0–99)
Lymphocytes Relative: 25.2 % (ref 12.0–46.0)
MCV: 87.6 fL (ref 78.0–100.0)
Monocytes Relative: 5.3 % (ref 3.0–12.0)
Neutrophils Relative %: 65.9 % (ref 43.0–77.0)
Nitrite: NEGATIVE
Potassium: 3.8 meq/L (ref 3.5–5.1)
RBC: 4.36 M/uL (ref 3.87–5.11)
Sodium: 138 meq/L (ref 135–145)
Specific Gravity, Urine: 1.01 (ref 1.000–1.03)
Total Bilirubin: 0.9 mg/dL (ref 0.3–1.2)
Total Protein, Urine: NEGATIVE mg/dL
Total Protein: 7.1 g/dL (ref 6.0–8.3)
Urine Glucose: NEGATIVE mg/dL
Urobilinogen, UA: 0.2 (ref 0.0–1.0)
WBC: 6.1 10*3/uL (ref 4.5–10.5)

## 2008-02-13 ENCOUNTER — Ambulatory Visit: Payer: Self-pay | Admitting: Internal Medicine

## 2008-02-13 ENCOUNTER — Telehealth (INDEPENDENT_AMBULATORY_CARE_PROVIDER_SITE_OTHER): Payer: Self-pay | Admitting: *Deleted

## 2008-02-13 DIAGNOSIS — R21 Rash and other nonspecific skin eruption: Secondary | ICD-10-CM | POA: Insufficient documentation

## 2008-02-14 LAB — CONVERTED CEMR LAB: Vit D, 1,25-Dihydroxy: 25 — ABNORMAL LOW (ref 30–89)

## 2008-02-24 ENCOUNTER — Telehealth (INDEPENDENT_AMBULATORY_CARE_PROVIDER_SITE_OTHER): Payer: Self-pay | Admitting: *Deleted

## 2008-03-06 ENCOUNTER — Ambulatory Visit: Payer: Self-pay | Admitting: Internal Medicine

## 2008-03-06 DIAGNOSIS — E039 Hypothyroidism, unspecified: Secondary | ICD-10-CM | POA: Insufficient documentation

## 2008-03-06 DIAGNOSIS — K140 Glossitis: Secondary | ICD-10-CM

## 2008-03-06 DIAGNOSIS — E559 Vitamin D deficiency, unspecified: Secondary | ICD-10-CM | POA: Insufficient documentation

## 2008-03-06 HISTORY — DX: Hypothyroidism, unspecified: E03.9

## 2008-03-06 HISTORY — DX: Glossitis: K14.0

## 2008-04-06 ENCOUNTER — Ambulatory Visit: Payer: Self-pay | Admitting: Internal Medicine

## 2008-04-20 ENCOUNTER — Telehealth: Payer: Self-pay | Admitting: Internal Medicine

## 2008-04-21 ENCOUNTER — Ambulatory Visit: Payer: Self-pay | Admitting: Internal Medicine

## 2008-04-21 DIAGNOSIS — T148XXA Other injury of unspecified body region, initial encounter: Secondary | ICD-10-CM | POA: Insufficient documentation

## 2008-05-18 ENCOUNTER — Telehealth (INDEPENDENT_AMBULATORY_CARE_PROVIDER_SITE_OTHER): Payer: Self-pay | Admitting: *Deleted

## 2008-06-29 ENCOUNTER — Telehealth (INDEPENDENT_AMBULATORY_CARE_PROVIDER_SITE_OTHER): Payer: Self-pay | Admitting: *Deleted

## 2008-07-09 ENCOUNTER — Ambulatory Visit: Payer: Self-pay | Admitting: Internal Medicine

## 2008-07-27 ENCOUNTER — Telehealth (INDEPENDENT_AMBULATORY_CARE_PROVIDER_SITE_OTHER): Payer: Self-pay | Admitting: *Deleted

## 2008-08-11 ENCOUNTER — Telehealth: Payer: Self-pay | Admitting: Internal Medicine

## 2008-08-12 ENCOUNTER — Ambulatory Visit: Payer: Self-pay | Admitting: Internal Medicine

## 2008-08-12 DIAGNOSIS — T783XXA Angioneurotic edema, initial encounter: Secondary | ICD-10-CM | POA: Insufficient documentation

## 2008-08-12 HISTORY — DX: Angioneurotic edema, initial encounter: T78.3XXA

## 2008-08-20 ENCOUNTER — Ambulatory Visit: Payer: Self-pay | Admitting: Internal Medicine

## 2008-08-20 DIAGNOSIS — R062 Wheezing: Secondary | ICD-10-CM

## 2008-08-20 HISTORY — DX: Wheezing: R06.2

## 2008-09-28 ENCOUNTER — Ambulatory Visit: Payer: Self-pay | Admitting: Internal Medicine

## 2008-12-14 ENCOUNTER — Telehealth: Payer: Self-pay | Admitting: Internal Medicine

## 2008-12-19 ENCOUNTER — Telehealth: Payer: Self-pay | Admitting: Internal Medicine

## 2008-12-21 ENCOUNTER — Ambulatory Visit: Payer: Self-pay | Admitting: Internal Medicine

## 2008-12-21 DIAGNOSIS — L0201 Cutaneous abscess of face: Secondary | ICD-10-CM | POA: Insufficient documentation

## 2008-12-21 DIAGNOSIS — L03211 Cellulitis of face: Secondary | ICD-10-CM

## 2008-12-21 HISTORY — DX: Cutaneous abscess of face: L02.01

## 2009-01-14 ENCOUNTER — Telehealth (INDEPENDENT_AMBULATORY_CARE_PROVIDER_SITE_OTHER): Payer: Self-pay | Admitting: *Deleted

## 2009-01-20 ENCOUNTER — Ambulatory Visit: Payer: Self-pay | Admitting: Internal Medicine

## 2009-01-20 DIAGNOSIS — F988 Other specified behavioral and emotional disorders with onset usually occurring in childhood and adolescence: Secondary | ICD-10-CM | POA: Insufficient documentation

## 2009-01-20 HISTORY — DX: Other specified behavioral and emotional disorders with onset usually occurring in childhood and adolescence: F98.8

## 2009-03-16 ENCOUNTER — Telehealth: Payer: Self-pay | Admitting: Internal Medicine

## 2009-03-19 ENCOUNTER — Ambulatory Visit: Payer: Self-pay | Admitting: Internal Medicine

## 2009-05-26 ENCOUNTER — Ambulatory Visit: Payer: Self-pay | Admitting: Internal Medicine

## 2009-05-26 DIAGNOSIS — H6980 Other specified disorders of Eustachian tube, unspecified ear: Secondary | ICD-10-CM

## 2009-05-26 DIAGNOSIS — H6990 Unspecified Eustachian tube disorder, unspecified ear: Secondary | ICD-10-CM

## 2009-05-26 HISTORY — DX: Other specified disorders of Eustachian tube, unspecified ear: H69.80

## 2009-05-26 HISTORY — DX: Unspecified eustachian tube disorder, unspecified ear: H69.90

## 2009-06-01 ENCOUNTER — Telehealth: Payer: Self-pay | Admitting: Internal Medicine

## 2009-06-01 ENCOUNTER — Encounter: Payer: Self-pay | Admitting: Internal Medicine

## 2009-07-22 ENCOUNTER — Ambulatory Visit: Payer: Self-pay | Admitting: Internal Medicine

## 2009-07-24 ENCOUNTER — Encounter: Payer: Self-pay | Admitting: Internal Medicine

## 2009-07-26 ENCOUNTER — Telehealth: Payer: Self-pay | Admitting: Internal Medicine

## 2009-08-12 ENCOUNTER — Ambulatory Visit: Payer: Self-pay | Admitting: Internal Medicine

## 2009-08-12 DIAGNOSIS — Z9010 Acquired absence of unspecified breast and nipple: Secondary | ICD-10-CM

## 2009-08-12 HISTORY — DX: Acquired absence of unspecified breast and nipple: Z90.10

## 2009-09-20 ENCOUNTER — Telehealth: Payer: Self-pay | Admitting: Internal Medicine

## 2009-10-07 ENCOUNTER — Ambulatory Visit: Payer: Self-pay | Admitting: Internal Medicine

## 2009-10-07 DIAGNOSIS — T07XXXA Unspecified multiple injuries, initial encounter: Secondary | ICD-10-CM | POA: Insufficient documentation

## 2009-10-26 ENCOUNTER — Ambulatory Visit: Payer: Self-pay | Admitting: Internal Medicine

## 2009-11-11 ENCOUNTER — Telehealth: Payer: Self-pay | Admitting: Internal Medicine

## 2010-01-07 ENCOUNTER — Ambulatory Visit
Admission: RE | Admit: 2010-01-07 | Discharge: 2010-01-07 | Payer: Self-pay | Source: Home / Self Care | Attending: Internal Medicine | Admitting: Internal Medicine

## 2010-02-01 NOTE — Assessment & Plan Note (Signed)
Summary: ear pain,diarrhea/cd   Vital Signs:  Patient profile:   55 year old female Height:      69 inches (175.26 cm) O2 Sat:      97 % on Room air Temp:     97.1 degrees F (36.17 degrees C) oral Pulse rate:   76 / minute BP sitting:   112 / 78  (left arm) Cuff size:   regular  Vitals Entered By: Josph Macho CMA (January 20, 2009 3:53 PM)  O2 Flow:  Room air CC: Ear pain in both ears- right ear is worse X3days/ pt didn't want to way/ CF   CC:  Ear pain in both ears- right ear is worse X3days/ pt didn't want to way/ CF.  History of Present Illness: here wtih 3 days onset bilat ear pain , fever for 3 days with headache and malaise;,  no ST, cough or wheezing  Pt denies CP, sob, doe, wheezing, orthopnea, pnd, worsening LE edema, palps, dizziness or syncope  Pt denies new neuro symptoms such as headache, facial or extremity weakness  ; needs adderall rx for ADD -    Problems Prior to Update: 1)  Add  (ICD-314.00) 2)  Cellulitis, Face  (ICD-682.0) 3)  Sinusitis- Acute-nos  (ICD-461.9) 4)  Cellulitis, Leg, Left  (ICD-682.6) 5)  Wheezing  (ICD-786.07) 6)  Angioedema  (ICD-995.1) 7)  Contusion of Unspecified Site  (ICD-924.9) 8)  Vitamin D Deficiency  (ICD-268.9) 9)  Hypothyroidism  (ICD-244.9) 10)  Uri  (ICD-465.9) 11)  Glossitis  (ICD-529.0) 12)  Rash-nonvesicular  (ICD-782.1) 13)  Preventive Health Care  (ICD-V70.0) 14)  Asthmatic Bronchitis, Acute  (ICD-466.0) 15)  Sinusitis- Acute-nos  (ICD-461.9) 16)  Otitis Media, Acute, Bilateral  (ICD-382.9) 17)  Hyperlipidemia  (ICD-272.4) 18)  Hematemesis  (ICD-578.0) 19)  Abdominal Pain, Epigastric  (ICD-789.06) 20)  Sinusitis- Acute-nos  (ICD-461.9) 21)  Wound, Open, Leg, Without Complication  (ICD-891.0) 22)  Ear Pain, Bilateral  (ICD-388.70) 23)  Cellulitis, Leg, Right  (ICD-682.6) 24)  Uri  (ICD-465.9) 25)  Syncope  (ICD-780.2) 26)  Neck Mass  (ICD-784.2) 27)  Chest Pain  (ICD-786.50) 28)  Rectal Bleeding   (ICD-569.3) 29)  Hemorrhoids  (ICD-455.6) 30)  Headache, Chronic  (ICD-784.0) 31)  Gi Bleeding  (ICD-578.9) 32)  Dvt  (ICD-453.40) 33)  Anxiety Depression  (ICD-300.4) 34)  Ibs  (ICD-564.1) 35)  Colonic Polyps  (ICD-211.3) 36)  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) 37)  Vertigo  (ICD-780.4) 38)  Abdominal Pain, Right Lower Quadrant  (ICD-789.03) 39)  Abdominal Pain, Right Upper Quadrant  (ICD-789.01) 40)  Sinusitis- Acute-nos  (ICD-461.9) 41)  Adiposity, Localized  (ICD-278.1) 42)  Fatty Liver Disease, Hx of  (ICD-V12.79) 43)  Cyst, Ovarian Nec/nos  (ICD-620.2) 44)  Colonic Polyps, Hx of  (ICD-V12.72) 45)  Neop, Malignant, Female Breast Nos  (ICD-174.9) 46)  Neop, Malignant, Thymus  (ICD-164.0) 47)  Allergic Rhinitis  (ICD-477.9) 48)  Irritable Bowel Syndrome, Hx of  (ICD-V12.79) 49)  Hypertension  (ICD-401.9) 50)  Depression  (ICD-311) 51)  Anxiety  (ICD-300.00) 52)  Dvt, Hx of  (ICD-V12.51) 53)  Colectomy, Partial, With Anastomosis, Hx of  (ICD-V15.2) 54)  Dysautonomia  (ICD-742.8)  Medications Prior to Update: 1)  Effexor Xr 150 Mg Cp24 (Venlafaxine Hcl) .[name].. 1 By Mouth Once Daily 2)  Levothyroxine Sodium 50 Mcg Tabs (Levothyroxine Sodium) .[name].. 1 By Mouth Once Daily 3)  Medrol (Pak) 4 Mg Tabs (Methylprednisolone) .... Use Asd 4)  Losartan Potassium 100 Mg Tabs (Losartan Potassium) .Marland KitchenMarland KitchenMarland Kitchen 1  By Mouth Once Daily 5)  Celebrex 200 Mg Caps (Celecoxib) .[name].. 1 By Mouth Two Times A Day As Needed 6)  Tylenol With Codeine #3 300-30 Mg Tabs (Acetaminophen-Codeine) .[name].. 1 By Mouth Four Times Per Day As Needed 7)  Tessalon Perles 100 Mg Caps (Benzonatate) .[name].. 1 - 2 By Mouth Three Times A Day As Needed 8)  Septra Ds 800-160 Mg Tabs (Sulfamethoxazole-Trimethoprim) .[name].. 1po Two Times A Day 9)  Zolpidem Tartrate 10 Mg Tabs (Zolpidem Tartrate) .[name].. 1po At Bedtime As Needed 10)  Amphetamine-Dextroamphetamine 20 Mg Tabs (Amphetamine-Dextroamphetamine) .... Take One Tablet Two Times A Day  Current  Medications (verified): 1)  Effexor Xr 150 Mg Cp24 (Venlafaxine Hcl) .[name].. 1 By Mouth Once Daily 2)  Levothyroxine Sodium 50 Mcg Tabs (Levothyroxine Sodium) .[name].. 1 By Mouth Once Daily 3)  Medrol (Pak) 4 Mg Tabs (Methylprednisolone) .... Use Asd 4)  Losartan Potassium 100 Mg Tabs (Losartan Potassium) .[name].. 1 By Mouth Once Daily 5)  Celebrex 200 Mg Caps (Celecoxib) .[name].. 1 By Mouth Two Times A Day As Needed 6)  Tylenol With Codeine #3 300-30 Mg Tabs (Acetaminophen-Codeine) .[name].. 1 By Mouth Four Times Per Day As Needed 7)  Minocycline Hcl 100 Mg Caps (Minocycline Hcl) .[name].. 1po Once Daily 8)  Zolpidem Tartrate 10 Mg Tabs (Zolpidem Tartrate) .[name].. 1po At Bedtime As Needed 9)  Amphetamine-Dextroamphetamine 20 Mg Tabs (Amphetamine-Dextroamphetamine) .... Take One Tablet Two Times A Day - To Fill Jan 20, 2009  Allergies (verified): 1)  ! Pcn 2)  ! * Pneumovax  Past History:  Past Surgical History: Last updated: 04/04/2007 Hysterectomy Oophorectomy Cholecystectomy spigellian hernia Multiple Ablations Bilateral mastectomy/reconstructioin C-Section  Social History: Last updated: 05/06/2007 Never Smoked Alcohol use-no Married  Risk Factors: Smoking Status: never (10/15/2006)  Past Medical History: bileteral mastectomy/breast ca svt s/p previous ablations right hemicolectomy gi bleed hemmrroids dysautonomia thymus cancer DVT, hx of - left subclavian Anxiety Depression Hypertension ibs Allergic rhinitis Colonic polyps, hx of shy-drager syndrome - per card Hyperlipidemia Hypothyroidism vit D deficiency ADD  Review of Systems       all otherwise negative per pt   Physical Exam  General:  alert and overweight-appearing.   Head:  normocephalic and atraumatic.   Eyes:  vision grossly intact, pupils equal, and pupils round.   Ears:  bialt tm's red, sinus nontender Nose:  nasal dischargemucosal pallor and mucosal erythema.   Mouth:  pharyngeal erythema and fair dentition.    Neck:  supple and cervical lymphadenopathy.   Lungs:  normal respiratory effort and normal breath sounds.   Heart:  normal rate and regular rhythm.   Extremities:  no edema, no erythema    Impression & Recommendations:  Problem # 1:  OTITIS MEDIA, ACUTE, BILATERAL (ICD-382.9)  Her updated medication list for this problem includes:    Celebrex 200 Mg Caps (Celecoxib) .[name]... 1 by mouth two times a day as needed    Minocycline Hcl 100 Mg Caps (Minocycline hcl) .[name]... 1po once daily treat as above, f/u any worsening signs or symptoms , with doxy course, then minocycline daily "suprpessive" tx  Problem # 2:  ADD (ICD-314.00) treat as above, f/u any worsening signs or symptoms  - the adderall, to which she responded very well in the past (2009) per dr Graciela Husbands  Problem # 3:  HYPERTENSION (ICD-401.9)  Her updated medication list for this problem includes:    Losartan Potassium 100 Mg Tabs (Losartan potassium) .[name]... 1 by mouth once daily  BP today: 112/78 Prior  BP: 140/90 (12/21/2008)  Labs Reviewed: K+: 3.8 (02/11/2008) Creat: : 0.9 (02/11/2008)   Chol: 155 (02/11/2008)   HDL: 51.3 (02/11/2008)   LDL: 73 (02/11/2008)   TG: 156 (02/11/2008) stable overall by hx and exam, ok to continue meds/tx as is   Complete Medication List: 1)  Effexor Xr 150 Mg Cp24 (Venlafaxine hcl) .[name].. 1 by mouth once daily 2)  Levothyroxine Sodium 50 Mcg Tabs (Levothyroxine sodium) .[name].. 1 by mouth once daily 3)  Medrol (pak) 4 Mg Tabs (Methylprednisolone) .... Use asd 4)  Losartan Potassium 100 Mg Tabs (Losartan potassium) .[name].. 1 by mouth once daily 5)  Celebrex 200 Mg Caps (Celecoxib) .[name].. 1 by mouth two times a day as needed 6)  Tylenol With Codeine #3 300-30 Mg Tabs (Acetaminophen-codeine) .[name].. 1 by mouth four times per day as needed 7)  Minocycline Hcl 100 Mg Caps (Minocycline hcl) .[name].. 1po once daily 8)  Zolpidem Tartrate 10 Mg Tabs (Zolpidem tartrate) .[name].. 1po at bedtime as needed 9)   Amphetamine-dextroamphetamine 20 Mg Tabs (Amphetamine-dextroamphetamine) .... Take one tablet two times a day - to fill Jan 20, 2009  Patient Instructions: 1)  Please take all new medications as prescribed - first the doxy course, then the minocycline dialy after that to hopefully prevent further problems 2)  Continue all previous medications as before this visit  3)  , including the refill on the adderall 4)  Please schedule a follow-up appointment in 6 months wtih CPX labs Prescriptions: MINOCYCLINE HCL 100 MG CAPS (MINOCYCLINE HCL) 1po once daily  #30 x 5   Entered and Authorized by:   Corwin Levins MD   Signed by:   Corwin Levins MD on 01/20/2009   Method used:   Print then Give to Patient   RxID:   [id number] AMPHETAMINE-DEXTROAMPHETAMINE 20 MG TABS (AMPHETAMINE-DEXTROAMPHETAMINE) take one tablet two times a day - to fill Jan 20, 2009  #60 x 0   Entered and Authorized by:   Corwin Levins MD   Signed by:   Corwin Levins MD on 01/20/2009   Method used:   Print then Give to Patient   RxID:   [id number] DOXYCYCLINE HYCLATE 100 MG CAPS (DOXYCYCLINE HYCLATE) 1 by mouth two times a day  #28 x 0   Entered and Authorized by:   Corwin Levins MD   Signed by:   Corwin Levins MD on 01/20/2009   Method used:   Print then Give to Patient   RxID:   [id number]

## 2010-02-01 NOTE — Assessment & Plan Note (Signed)
Summary: DOG PUNCTURE WOUNDS-DR JWJ PT-PER DR TLJ/OK SCHED W/HIM--STC   Vital Signs:  Patient profile:   55 year old female Height:      69 inches Weight:      201 pounds BMI:     29.79 O2 Sat:      98 % on Room air Temp:     97.9 degrees F oral Pulse rate:   87 / minute Pulse rhythm:   regular Resp:     16 per minute BP sitting:   130 / 80  (left arm) Cuff size:   regular  Vitals Entered By: Alysia Penna (October 07, 2009 1:12 PM)  Nutrition Counseling: Patient's BMI is greater than 25 and therefore counseled on weight management options.  O2 Flow:  Room air CC: pt c/o dog bites that will not stop bleeding. Also would like to have ears looked at. She travelled internationally and they have been bothering her since she returned./ cp sma   Primary Care Provider:  Corwin Levins MD  CC:  pt c/o dog bites that will not stop bleeding. Also would like to have ears looked at. She travelled internationally and they have been bothering her since she returned./ cp sma.  History of Present Illness: She complains that her two dogs were in a fight about 2 hours ago and she broke it up with her arms and she sustained PW's on both forearms. She has pain and mild swelling in both forearms but she has not noticed any N/W/T in her arms or hands. The PW's bled but the bleeding has ceased. She has irrigated the wounds at home.  Preventive Screening-Counseling & Management  Alcohol-Tobacco     Alcohol drinks/day: 0     Smoking Status: never  Medications Prior to Update: 1)  Effexor Xr 150 Mg Cp24 (Venlafaxine Hcl) .[name].. 1 By Mouth Once Daily 2)  Levothyroxine Sodium 50 Mcg Tabs (Levothyroxine Sodium) .[name].. 1 By Mouth Once Daily 3)  Losartan Potassium 100 Mg Tabs (Losartan Potassium) .[name].. 1 By Mouth Once Daily 4)  Celebrex 200 Mg Caps (Celecoxib) .[name].. 1 By Mouth Two Times A Day As Needed 5)  Tylenol With Codeine #3 300-30 Mg Tabs (Acetaminophen-Codeine) .[name].. 1 By Mouth Four Times Per Day As  Needed 6)  Minocycline Hcl 100 Mg Caps (Minocycline Hcl) .[name].. 1po Once Daily 7)  Zolpidem Tartrate 10 Mg Tabs (Zolpidem Tartrate) .[name].. 1po At Bedtime As Needed 8)  Amphetamine-Dextroamphetamine 20 Mg Tabs (Amphetamine-Dextroamphetamine) .... Take One Tablet Two Times A Day - To Fill June 14, 2009 9)  Ceftin 250 Mg Tabs (Cefuroxime Axetil) .[name].. 1 Po Two Times A Day 10)  Promethazine Hcl 25 Mg Tabs (Promethazine Hcl) .... By Mouth Q 6 Hrs As Needed Nausea 11)  Meclizine Hcl 12.5 Mg Tabs (Meclizine Hcl) .[name].. 1po Q 6 Hrs As Needed Dizziness 12)  Fexofenadine Hcl 180 Mg Tabs (Fexofenadine Hcl) .[name].. 1po Once Daily As Needed Allergies 13)  Fluticasone Propionate 50 Mcg/act Susp (Fluticasone Propionate) .... 2 Spray/side Once Daily  Current Medications (verified): 1)  Effexor Xr 150 Mg Cp24 (Venlafaxine Hcl) .[name].. 1 By Mouth Once Daily 2)  Levothyroxine Sodium 50 Mcg Tabs (Levothyroxine Sodium) .[name].. 1 By Mouth Once Daily 3)  Losartan Potassium 100 Mg Tabs (Losartan Potassium) .[name].. 1 By Mouth Once Daily 4)  Celebrex 200 Mg Caps (Celecoxib) .[name].. 1 By Mouth Two Times A Day As Needed 5)  Tylenol With Codeine #3 300-30 Mg Tabs (Acetaminophen-Codeine) .[name].. 1 By Mouth Four  Times Per Day As Needed 6)  Minocycline Hcl 100 Mg Caps (Minocycline Hcl) .[name].. 1po Once Daily 7)  Zolpidem Tartrate 10 Mg Tabs (Zolpidem Tartrate) .[name].. 1po At Bedtime As Needed 8)  Amphetamine-Dextroamphetamine 20 Mg Tabs (Amphetamine-Dextroamphetamine) .... Take One Tablet Two Times A Day - To Fill June 14, 2009 9)  Promethazine Hcl 25 Mg Tabs (Promethazine Hcl) .... By Mouth Q 6 Hrs As Needed Nausea 10)  Meclizine Hcl 12.5 Mg Tabs (Meclizine Hcl) .[name].. 1po Q 6 Hrs As Needed Dizziness 11)  Fexofenadine Hcl 180 Mg Tabs (Fexofenadine Hcl) .[name].. 1po Once Daily As Needed Allergies 12)  Fluticasone Propionate 50 Mcg/act Susp (Fluticasone Propionate) .... 2 Spray/side Once Daily 13)  Doxycycline Hyclate 100 Mg Tabs (Doxycycline Hyclate) .... One  By Mouth Two Times A Day For 10 Days  Allergies (verified): 1)  ! Pcn 2)  ! * Pneumovax  Past History:  Past Medical History: Last updated: 05/26/2009 bileteral mastectomy/breast ca svt s/p previous ablations right hemicolectomy gi bleed hemmrroids dysautonomia thymus cancer DVT, hx of - left subclavian Anxiety Depression Hypertension ibs Allergic rhinitis Colonic polyps, hx of shy-drager syndrome - per card Hyperlipidemia Hypothyroidism vit D deficiency ADD  Past Surgical History: Last updated: 04/04/2007 Hysterectomy Oophorectomy Cholecystectomy spigellian hernia Multiple Ablations Bilateral mastectomy/reconstructioin C-Section  Family History: Last updated: 10/15/2006 sister - ovary ca  Social History: Last updated: 05/26/2009 Never Smoked Alcohol use-no Married Drug use-no  Risk Factors: Alcohol Use: 0 (10/07/2009)  Risk Factors: Smoking Status: never (10/07/2009)  Family History: Reviewed history from 10/15/2006 and no changes required. sister - ovary ca  Social History: Reviewed history from 05/26/2009 and no changes required. Never Smoked Alcohol use-no Married Drug use-no  Review of Systems  The patient denies fever, chest pain, abdominal pain, and enlarged lymph nodes.   MS:  Complains of muscle aches and stiffness; denies joint pain, joint redness, joint swelling, and loss of strength. Neuro:  Denies brief paralysis, numbness, tingling, tremors, and weakness.  Physical Exam  General:  alert, well-developed, well-nourished, well-hydrated, and overweight-appearing.   Head:  Normocephalic and atraumatic without obvious abnormalities. No apparent alopecia or balding. Ears:  R ear normal and L ear normal.   Nose:  External nasal examination shows no deformity or inflammation. Nasal mucosa are pink and moist without lesions or exudates. Mouth:  no gingival abnormalities and pharynx pink and moist.   Neck:  supple and no masses.     Lungs:  normal respiratory effort and normal breath sounds.   Heart:  normal rate and regular rhythm.   Abdomen:  Bowel sounds positive,abdomen soft and non-tender without masses, organomegaly or hernias noted. Msk:  she has several small puncture wounds on both upper forearms R>L, they have closed and are not gaping. there is mild associated swelling but no ttp or FB's. all joints show FROM with no ttp. she can demonstrate normal flexion and extenstion in her wrists, and MCP/PIP/DIP joints. she has good sensation and capillary refill distally with no evidence of campartment syndrome. Extremities:  No clubbing, cyanosis, edema, or deformity noted with normal full range of motion of all joints.   Neurologic:  No cranial nerve deficits noted. Station and gait are normal. Plantar reflexes are down-going bilaterally. DTRs are symmetrical throughout. Sensory, motor and coordinative functions appear intact. Skin:  turgor normal, color normal, no rashes, no suspicious lesions, no petechiae, no purpura, no ulcerations, and no edema.   Cervical Nodes:  no anterior cervical adenopathy and no posterior cervical adenopathy.  Axillary Nodes:  no R axillary adenopathy and no L axillary adenopathy.   Psych:  Cognition and judgment appear intact. Alert and cooperative with normal attention span and concentration. No apparent delusions, illusions, hallucinations   Impression & Recommendations:  Problem # 1:  ANIMAL BITE (ICD-919.8) Assessment New I advised her that these wounds should not be closed with sutures b/c they are too small and there is a high risk of infection with animal bites. the wounds were cleaned and irrigated and neosporin and ace wraps/dressings were applied. she is PCN allergic so Doxy will be started to prevent infection.  Complete Medication List: 1)  Effexor Xr 150 Mg Cp24 (Venlafaxine hcl) .[name].. 1 by mouth once daily 2)  Levothyroxine Sodium 50 Mcg Tabs (Levothyroxine sodium) .[name].. 1 by  mouth once daily 3)  Losartan Potassium 100 Mg Tabs (Losartan potassium) .[name].. 1 by mouth once daily 4)  Celebrex 200 Mg Caps (Celecoxib) .[name].. 1 by mouth two times a day as needed 5)  Tylenol With Codeine #3 300-30 Mg Tabs (Acetaminophen-codeine) .[name].. 1 by mouth four times per day as needed 6)  Minocycline Hcl 100 Mg Caps (Minocycline hcl) .[name].. 1po once daily 7)  Zolpidem Tartrate 10 Mg Tabs (Zolpidem tartrate) .[name].. 1po at bedtime as needed 8)  Amphetamine-dextroamphetamine 20 Mg Tabs (Amphetamine-dextroamphetamine) .... Take one tablet two times a day - to fill June 14, 2009 9)  Promethazine Hcl 25 Mg Tabs (Promethazine hcl) .... By mouth q 6 hrs as needed nausea 10)  Meclizine Hcl 12.5 Mg Tabs (Meclizine hcl) .[name].. 1po q 6 hrs as needed dizziness 11)  Fexofenadine Hcl 180 Mg Tabs (Fexofenadine hcl) .[name].. 1po once daily as needed allergies 12)  Fluticasone Propionate 50 Mcg/act Susp (Fluticasone propionate) .... 2 spray/side once daily 13)  Doxycycline Hyclate 100 Mg Tabs (Doxycycline hyclate) .... One by mouth two times a day for 10 days  Patient Instructions: 1)  Please schedule a follow-up appointment in 2 weeks. 2)  Take your antibiotic as prescribed until ALL of it is gone, but stop if you develop a rash or swelling and contact our office as soon as possible. 3)  keep your arms elevated as much as possible. let me know if these wounds become red, more painful, drain pus, feel hot, or swell more. report any numbness or tingling immediately. notify me of any fever or chills. Prescriptions: DOXYCYCLINE HYCLATE 100 MG TABS (DOXYCYCLINE HYCLATE) One by mouth two times a day for 10 days  #20 x 0   Entered and Authorized by:   Etta Grandchild MD   Signed by:   Etta Grandchild MD on 10/07/2009   Method used:   Electronically to        [facility]  [phone]* (retail)       [address] [place], [place]  [address]       Ph: [phone] or [phone]       Fax: [phone]   RxID:    [id number]

## 2010-02-01 NOTE — Assessment & Plan Note (Signed)
Summary: COUGH NOT BETTER  $50  STC   Vital Signs:  Patient Profile:   55 Years Old Female Temp:     97.1 degrees F oral Pulse rate:   84 / minute BP sitting:   120 / 80  (right arm) Cuff size:   regular  Vitals Entered By: Windell Norfolk (February 11, 2008 10:34 AM)                 Preventive Care Screening  Pap Smear:    Date:  11/04/2007    Results:  normal   Last Flu Shot:    Date:  11/04/2007    Results:  given    Chief Complaint:  Cough.  History of Present Illness: overall some better, but then worse again with cough, wheezing, sob and mild DOE; no CP, orthopnea, pnd or LE edema    Prior Medication List:  ACETAMINOPHEN-CODEINE #3 300-30 MG TABS (ACETAMINOPHEN-CODEINE) Take 1 tablet by mouth four times a day as needed CETIRIZINE HCL 10 MG TABS (CETIRIZINE HCL) 1 tablet by mouth once a day EFFEXOR XR 150 MG CP24 (VENLAFAXINE HCL) 1 by mouth once daily ATENOLOL 25 MG TABS (ATENOLOL) 1 by mouth bid ZOFRAN ODT 4 MG  TBDP (ONDANSETRON) 1 by mouth qid prn BENTYL 10 MG  CAPS (DICYCLOMINE HCL) 1 - 2 by mouth qid prn FLUTICASONE PROPIONATE 50 MCG/ACT  SUSP (FLUTICASONE PROPIONATE) 2 spray/side two times a day MECLIZINE HCL 12.5 MG  TABS (MECLIZINE HCL) 1 by mouth qid prn ADDERALL XR 20 MG  CP24 (AMPHETAMINE-DEXTROAMPHETAMINE) 1 by mouth qd PROTONIX 40 MG  TBEC (PANTOPRAZOLE SODIUM) 1 by mouth two times a day ALPRAZOLAM 0.5 MG  TABS (ALPRAZOLAM) 1 by mouth three times a day as needed nerves LIPITOR 40 MG TABS (ATORVASTATIN CALCIUM) 1 by mouth once daily CLARITHROMYCIN 500 MG TABS (CLARITHROMYCIN) 1 by mouth two times a day TUSSIONEX PENNKINETIC ER 8-10 MG/5ML LQCR (CHLORPHENIRAMINE-HYDROCODONE) 1 tsp by mouth two times a day as needed cough PREDNISONE 10 MG TABS (PREDNISONE) 3po qd for 3days, then 2po qd for 3days, then 1po qd for 3days, then stop XOPENEX HFA 45 MCG/ACT AERO (LEVALBUTEROL TARTRATE) 2 puffs four times per day as needed for wheezing   Updated Prior  Medication List: ACETAMINOPHEN-CODEINE #3 300-30 MG TABS (ACETAMINOPHEN-CODEINE) Take 1 tablet by mouth four times a day as needed CETIRIZINE HCL 10 MG TABS (CETIRIZINE HCL) 1 tablet by mouth once a day EFFEXOR XR 150 MG CP24 (VENLAFAXINE HCL) 1 by mouth once daily ATENOLOL 25 MG TABS (ATENOLOL) 1 by mouth bid ZOFRAN ODT 4 MG  TBDP (ONDANSETRON) 1 by mouth qid prn BENTYL 10 MG  CAPS (DICYCLOMINE HCL) 1 - 2 by mouth qid prn FLUTICASONE PROPIONATE 50 MCG/ACT  SUSP (FLUTICASONE PROPIONATE) 2 spray/side two times a day MECLIZINE HCL 12.5 MG  TABS (MECLIZINE HCL) 1 by mouth qid prn ADDERALL XR 20 MG  CP24 (AMPHETAMINE-DEXTROAMPHETAMINE) 1 by mouth qd PROTONIX 40 MG  TBEC (PANTOPRAZOLE SODIUM) 1 by mouth two times a day ALPRAZOLAM 0.5 MG  TABS (ALPRAZOLAM) 1 by mouth three times a day as needed nerves LIPITOR 40 MG TABS (ATORVASTATIN CALCIUM) 1 by mouth once daily DOXYCYCLINE HYCLATE 100 MG TABS (DOXYCYCLINE HYCLATE) 1po two times a day TUSSIONEX PENNKINETIC ER 8-10 MG/5ML LQCR (CHLORPHENIRAMINE-HYDROCODONE) 1 tsp by mouth two times a day as needed cough SYMBICORT 160-4.5 MCG/ACT AERO (BUDESONIDE-FORMOTEROL FUMARATE) 2 puffs twice per day XOPENEX HFA 45 MCG/ACT AERO (LEVALBUTEROL TARTRATE) 2 puffs four times per day as needed  for wheezing  Current Allergies (reviewed today): ! SULFA ! PCN DOXYCYCLINE  Past Medical History:    Reviewed history from 12/12/2007 and no changes required:       bileteral mastectomy/breast ca       svt s/p previous ablations       right hemicolectomy       gi bleed       hemmrroids       dysautonomia       thymus cancer       DVT, hx of - left subclavian       Anxiety       Depression       Hypertension       ibs       Allergic rhinitis       Colonic polyps, hx of       shy-drager syndrome - per card       Hyperlipidemia  Past Surgical History:    Reviewed history from 04/04/2007 and no changes required:       Hysterectomy       Oophorectomy        Cholecystectomy       spigellian hernia       Multiple Ablations       Bilateral mastectomy/reconstructioin       C-Section   Social History:    Reviewed history from 05/06/2007 and no changes required:       Never Smoked       Alcohol use-no       Married   Risk Factors:  PAP Smear History:     Date of Last PAP Smear:  11/04/2007    Results:  normal    Review of Systems  The patient denies anorexia, fever, weight loss, weight gain, vision loss, decreased hearing, hoarseness, chest pain, syncope, dyspnea on exertion, peripheral edema, prolonged cough, headaches, hemoptysis, abdominal pain, melena, hematochezia, severe indigestion/heartburn, hematuria, incontinence, muscle weakness, suspicious skin lesions, transient blindness, difficulty walking, depression, unusual weight change, abnormal bleeding, enlarged lymph nodes, angioedema, and breast masses.         all otherwise negative    Physical Exam  General:     alert and overweight-appearing.   Head:     Normocephalic and atraumatic without obvious abnormalities. No apparent alopecia or balding. Eyes:     No corneal or conjunctival inflammation noted. EOMI. Perrla.  Ears:     External ear exam shows no significant lesions or deformities.  Otoscopic examination reveals clear canals, tympanic membranes are intact bilaterally without bulging, retraction, inflammation or discharge. Hearing is grossly normal bilaterally. Nose:     External nasal examination shows no deformity or inflammation. Nasal mucosa are pink and moist without lesions or exudates. Mouth:     pharyngeal erythema and fair dentition.   Neck:     No deformities, masses, or tenderness noted. Lungs:     R decreased breath sounds and L decreased breath sounds.   Heart:     normal rate and regular rhythm.   Abdomen:     soft, non-tender, and normal bowel sounds.   Msk:     no joint tenderness and no joint swelling.   Extremities:     no edema, no ulcers    Neurologic:     cranial nerves II-XII intact and strength normal in all extremities.      Impression & Recommendations:  Problem # 1:  Preventive Health Care (ICD-V70.0) Overall doing well, up to date, counseled on  routine health concerns for screening and prevention, immunizations up to date or declined, labs ordered   Orders: T-Vitamin D (25-Hydroxy) (11914-78295) TLB-BMP (Basic Metabolic Panel-BMET) (80048-METABOL) TLB-CBC Platelet - w/Differential (85025-CBCD) TLB-Hepatic/Liver Function Pnl (80076-HEPATIC) TLB-Lipid Panel (80061-LIPID) TLB-TSH (Thyroid Stimulating Hormone) (84443-TSH) TLB-Udip ONLY (81003-UDIP) TLB-Udip w/ Micro (81001-URINE)   Problem # 2:  ASTHMATIC BRONCHITIS, ACUTE (ICD-466.0)  Her updated medication list for this problem includes:    Doxycycline Hyclate 100 Mg Tabs (Doxycycline hyclate) .[name]... 1po two times a day    Tussionex Pennkinetic Er 8-10 Mg/21ml Lqcr (Chlorpheniramine-hydrocodone) .[name]... 1 tsp by mouth two times a day as needed cough    Symbicort 160-4.5 Mcg/act Aero (Budesonide-formoterol fumarate) .[name]... 2 puffs twice per day    Xopenex Hfa 45 Mcg/act Aero (Levalbuterol tartrate) .[name]... 2 puffs four times per day as needed for wheezing treat as above, f/u any worsening signs or symptoms for persistent symptoms Orders: T-2 View CXR, Same Day (71020.5TC)   Complete Medication List: 1)  Acetaminophen-codeine #3 300-30 Mg Tabs (Acetaminophen-codeine) .... Take 1 tablet by mouth four times a day as needed 2)  Cetirizine Hcl 10 Mg Tabs (Cetirizine hcl) .[name].. 1 tablet by mouth once a day 3)  Effexor Xr 150 Mg Cp24 (Venlafaxine hcl) .[name].. 1 by mouth once daily 4)  Atenolol 25 Mg Tabs (Atenolol) .[name].. 1 by mouth bid 5)  Zofran Odt 4 Mg Tbdp (Ondansetron) .[name].. 1 by mouth qid prn 6)  Bentyl 10 Mg Caps (Dicyclomine hcl) .[name].. 1 - 2 by mouth qid prn 7)  Fluticasone Propionate 50 Mcg/act Susp (Fluticasone propionate) .... 2 spray/side two times a day 8)   Meclizine Hcl 12.5 Mg Tabs (Meclizine hcl) .[name].. 1 by mouth qid prn 9)  Adderall Xr 20 Mg Cp24 (Amphetamine-dextroamphetamine) .[name].. 1 by mouth qd 10)  Protonix 40 Mg Tbec (Pantoprazole sodium) .[name].. 1 by mouth two times a day 11)  Alprazolam 0.5 Mg Tabs (Alprazolam) .[name].. 1 by mouth three times a day as needed nerves 12)  Lipitor 40 Mg Tabs (Atorvastatin calcium) .[name].. 1 by mouth once daily 13)  Doxycycline Hyclate 100 Mg Tabs (Doxycycline hyclate) .[name].. 1po two times a day 14)  Tussionex Pennkinetic Er 8-10 Mg/36ml Lqcr (Chlorpheniramine-hydrocodone) .[name].. 1 tsp by mouth two times a day as needed cough 15)  Symbicort 160-4.5 Mcg/act Aero (Budesonide-formoterol fumarate) .... 2 puffs twice per day 16)  Xopenex Hfa 45 Mcg/act Aero (Levalbuterol tartrate) .... 2 puffs four times per day as needed for wheezing  Other Orders: Tdap => 75yrs IM (62130) Admin 1st Vaccine (86578) Pneumococcal Vaccine (46962) Admin of Any Addtl Vaccine (95284) TLB-B12 + Folate Pnl (13244_01027-O53/GUY) TLB-IBC Pnl (Iron/FE;Transferrin) (83550-IBC)   Patient Instructions: 1)  you received the tetanus and pneumonia shots 2)  Please go to the Lab in the basement for your blood tests today 3)  Please take all new medications as prescribed - the antibx, cough med, and the symbicort 4)  Please go to Radiology in the basement level for your X-Ray today  5)  Continue all medications that you may have been taking previously , including the inhaler you already have as needed 6)  Please schedule a follow-up appointment as needed.   Prescriptions: SYMBICORT 160-4.5 MCG/ACT AERO (BUDESONIDE-FORMOTEROL FUMARATE) 2 puffs twice per day  #1 x 11   Entered and Authorized by:   Corwin Levins MD   Signed by:   Corwin Levins MD on 02/11/2008   Method used:   Print then Give to Patient  RxID:   [id number] Sandria Senter ER 8-10 MG/5ML LQCR (CHLORPHENIRAMINE-HYDROCODONE) 1 tsp by mouth two times a day as needed cough  #6 oz x  1   Entered and Authorized by:   Corwin Levins MD   Signed by:   Corwin Levins MD on 02/11/2008   Method used:   Print then Give to Patient   RxID:   [id number] DOXYCYCLINE HYCLATE 100 MG TABS (DOXYCYCLINE HYCLATE) 1po two times a day  #20 x 0   Entered and Authorized by:   Corwin Levins MD   Signed by:   Corwin Levins MD on 02/11/2008   Method used:   Print then Give to Patient   RxID:   [id number]    Other Immunization History:    H1N1 # 1:  H1N1 vaccine G code (11/04/2007)    Tetanus/Td Vaccine    Vaccine Type: Tdap    Site: right deltoid    Mfr: GlaxoSmithKline    Dose: 0.5 ml    Route: IM    Given by: Windell Norfolk    Exp. Date: 12/06/2009    Lot #: [id number]  Pneumovax Vaccine    Vaccine Type: Pneumovax    Site: left deltoid    Mfr: Merck    Dose: 0.5 ml    Route: IM    Given by: Windell Norfolk    Exp. Date: 01/30/2009    Lot #: [id number]

## 2010-02-01 NOTE — Letter (Signed)
Summary: Call A Nurse  Call A Nurse   Imported By: Sherian Rein 07/29/2009 08:53:10  _____________________________________________________________________  External Attachment:    Type:   Image     Comment:   External Document

## 2010-02-01 NOTE — Progress Notes (Signed)
Summary: medication refill  Phone Note Refill Request Message from:  Fax from Pharmacy on September 20, 2009 9:33 AM  Refills Requested: Medication #1:  TYLENOL WITH CODEINE #3 300-30 MG TABS 1 by mouth four times per day as needed   Dosage confirmed as above?Dosage Confirmed   Last Refilled: 06/01/2009   Notes: [facility], [phone] Initial call taken by: Robin Ewing CMA Duncan Dull),  September 20, 2009 9:33 AM  Follow-up for Phone Call        done hardcopy to [facility] side B - dahlia  Follow-up by: Corwin Levins MD,  September 20, 2009 11:51 AM    New/Updated Medications: TYLENOL WITH CODEINE #3 300-30 MG TABS (ACETAMINOPHEN-CODEINE) 1 by mouth four times per day as needed Prescriptions: TYLENOL WITH CODEINE #3 300-30 MG TABS (ACETAMINOPHEN-CODEINE) 1 by mouth four times per day as needed  #120 x 2   Entered and Authorized by:   Corwin Levins MD   Signed by:   Corwin Levins MD on 09/20/2009   Method used:   Print then Give to Patient   RxID:   [id number]   Appended Document: medication refill faxed hardcopy to pharmacy

## 2010-02-01 NOTE — Progress Notes (Signed)
Summary: Adderall  Phone Note Refill Request Message from:  Patient on November 11, 2009 10:20 AM  Refills Requested: Medication #1:  AMPHETAMINE-DEXTROAMPHETAMINE 20 MG TABS take one tablet two times a day - to fill june 13   Dosage confirmed as above?Dosage Confirmed   Supply Requested: 1 month  Method Requested: Pick up at Office Initial call taken by: Margaret Pyle, CMA,  November 11, 2009 10:21 AM  Follow-up for Phone Call        there is currently backorder for the 20's she is taking two times a day;  so we should try to change to generic concerta 36 mg - 1 per day  done hardcopy to [facility] side B - dahlia   Follow-up by: Corwin Levins MD,  November 11, 2009 11:30 AM  Additional Follow-up for Phone Call Additional follow up Details #1::        Pt advised in detail via VM, Rx in cabinet for pt pick up, pt advised to also call and advise how medicine is working for her. Additional Follow-up by: Margaret Pyle, CMA,  November 11, 2009 2:27 PM    New/Updated Medications: ADDERALL XR 20 MG XR24H-CAP (AMPHETAMINE-DEXTROAMPHETAMINE) 1po once daily CONCERTA 36 MG CR-TABS (METHYLPHENIDATE HCL) 1po once daily  - generic Prescriptions: CONCERTA 36 MG CR-TABS (METHYLPHENIDATE HCL) 1po once daily  - generic  #30 x 0   Entered and Authorized by:   Corwin Levins MD   Signed by:   Corwin Levins MD on 11/11/2009   Method used:   Print then Give to Patient   RxID:   [id number] ADDERALL XR 20 MG XR24H-CAP (AMPHETAMINE-DEXTROAMPHETAMINE) 1po once daily  #30 x 0   Entered and Authorized by:   Corwin Levins MD   Signed by:   Corwin Levins MD on 11/11/2009   Method used:   Print then Give to Patient   RxID:   [id number]  adderal rx done in error ;  pt only given the concerta Corwin Levins MD  November 11, 2009 11:33 AM

## 2010-02-01 NOTE — Progress Notes (Signed)
Summary: refill  Medications Added AMPHETAMINE-DEXTROAMPHETAMINE 20 MG TABS (AMPHETAMINE-DEXTROAMPHETAMINE) take one tablet two times a day       Phone Note Refill Request Call back at [phone] Message from:  Patient on January 14, 2009 2:16 PM  Refills Requested: Medication #1:  dextromap-amphet 20mg  1 tab twice daily   Supply Requested: 1 month need written order   Method Requested: Pick up at Office Initial call taken by: Migdalia Dk,  January 14, 2009 2:17 PM  Follow-up for Phone Call        Checked with Dr. Graciela Husbands;.  Pt has not been seen since very early 2010 and has not been evaluated for the use of this Adderall since then.  Pt has been asked to be followed by the primary physician for this medication..  Called to make pt aware.  No answer, no machine.  Will try again later Judithe Modest [facility]  January 15, 2009 4:10 PM  Follow-up by: Judithe Modest CMA,  January 14, 2009 2:41 PM  Additional Follow-up for Phone Call Additional follow up Details #1::        The two Rxs printed out for [facility] signature were disposed of as he has decided for her to follow up with her primary care for this medication.      New/Updated Medications: AMPHETAMINE-DEXTROAMPHETAMINE 20 MG TABS (AMPHETAMINE-DEXTROAMPHETAMINE) take one tablet two times a day Prescriptions: AMPHETAMINE-DEXTROAMPHETAMINE 20 MG TABS (AMPHETAMINE-DEXTROAMPHETAMINE) take one tablet two times a day  #60 x 0   Entered by:   Judithe Modest CMA   Authorized by:   Nathen May, MD, [facility]   Signed by:   Judithe Modest CMA on 01/14/2009   Method used:   Print then Give to Patient   RxID:   [id number] AMPHETAMINE-DEXTROAMPHETAMINE 20 MG TABS (AMPHETAMINE-DEXTROAMPHETAMINE) take one tablet two times a day  #60 x 0   Entered by:   Judithe Modest CMA   Authorized by:   Nathen May, MD, [facility]   Signed by:   Judithe Modest CMA on 01/14/2009   Method used:   Print then Give to Patient   RxID:    [id number]

## 2010-02-01 NOTE — Assessment & Plan Note (Signed)
Summary: SORE THROAT-TONGUE SWOLLEN/CRACKED-COUGH-$50--STC   Vital Signs:  Patient Profile:   55 Years Old Female Temp:     99.1 degrees F oral Pulse rate:   96 / minute BP sitting:   142 / 96  (right arm) Cuff size:   regular  Vitals Entered By: Windell Norfolk (March 06, 2008 4:00 PM)                 Chief Complaint:  split tongue.  History of Present Illness: here with severe pain and swelling it seems of the tongue, admits to not rinsing the mouth after symbicort use, has several fissures to the tongue as well ; also some ST with low grade temp, but ears and chest seem without problem today    Prior Medication List:  ACETAMINOPHEN-CODEINE #3 300-30 MG TABS (ACETAMINOPHEN-CODEINE) Take 1 tablet by mouth four times a day as needed EFFEXOR XR 150 MG CP24 (VENLAFAXINE HCL) 1 by mouth once daily ATENOLOL 25 MG TABS (ATENOLOL) 1 by mouth bid ZOFRAN ODT 4 MG  TBDP (ONDANSETRON) 1 by mouth qid prn BENTYL 10 MG  CAPS (DICYCLOMINE HCL) 1 - 2 by mouth qid prn FLUTICASONE PROPIONATE 50 MCG/ACT  SUSP (FLUTICASONE PROPIONATE) 2 spray/side two times a day MECLIZINE HCL 12.5 MG  TABS (MECLIZINE HCL) 1 by mouth qid prn ADDERALL XR 20 MG  CP24 (AMPHETAMINE-DEXTROAMPHETAMINE) 1 by mouth qd PROTONIX 40 MG  TBEC (PANTOPRAZOLE SODIUM) 1 by mouth two times a day ALPRAZOLAM 0.5 MG  TABS (ALPRAZOLAM) 1 by mouth three times a day as needed nerves LIPITOR 40 MG TABS (ATORVASTATIN CALCIUM) 1 by mouth once daily BACTRIM DS 800-160 MG TABS (SULFAMETHOXAZOLE-TRIMETHOPRIM) 1 by mouth two times a day TUSSIONEX PENNKINETIC ER 8-10 MG/5ML LQCR (CHLORPHENIRAMINE-HYDROCODONE) 1 tsp by mouth two times a day as needed cough SYMBICORT 160-4.5 MCG/ACT AERO (BUDESONIDE-FORMOTEROL FUMARATE) 2 puffs twice per day XOPENEX HFA 45 MCG/ACT AERO (LEVALBUTEROL TARTRATE) 2 puffs four times per day as needed for wheezing LEVOTHYROXINE SODIUM 50 MCG TABS (LEVOTHYROXINE SODIUM) 1 by mouth once daily MEDROL (PAK) 4 MG TABS  (METHYLPREDNISOLONE) use asd OXYCODONE HCL 5 MG TABS (OXYCODONE HCL) 1 by mouth q 6 hrs as needed pain   Updated Prior Medication List: ACETAMINOPHEN-CODEINE #3 300-30 MG TABS (ACETAMINOPHEN-CODEINE) Take 1 tablet by mouth four times a day as needed EFFEXOR XR 150 MG CP24 (VENLAFAXINE HCL) 1 by mouth once daily ATENOLOL 25 MG TABS (ATENOLOL) 1 by mouth bid ZOFRAN ODT 4 MG  TBDP (ONDANSETRON) 1 by mouth qid prn BENTYL 10 MG  CAPS (DICYCLOMINE HCL) 1 - 2 by mouth qid prn FLUTICASONE PROPIONATE 50 MCG/ACT  SUSP (FLUTICASONE PROPIONATE) 2 spray/side two times a day MECLIZINE HCL 12.5 MG  TABS (MECLIZINE HCL) 1 by mouth qid prn ADDERALL XR 20 MG  CP24 (AMPHETAMINE-DEXTROAMPHETAMINE) 1 by mouth qd PROTONIX 40 MG  TBEC (PANTOPRAZOLE SODIUM) 1 by mouth two times a day ALPRAZOLAM 0.5 MG  TABS (ALPRAZOLAM) 1 by mouth three times a day as needed nerves LIPITOR 40 MG TABS (ATORVASTATIN CALCIUM) 1 by mouth once daily SYMBICORT 160-4.5 MCG/ACT AERO (BUDESONIDE-FORMOTEROL FUMARATE) 2 puffs twice per day XOPENEX HFA 45 MCG/ACT AERO (LEVALBUTEROL TARTRATE) 2 puffs four times per day as needed for wheezing LEVOTHYROXINE SODIUM 50 MCG TABS (LEVOTHYROXINE SODIUM) 1 by mouth once daily MEDROL (PAK) 4 MG TABS (METHYLPREDNISOLONE) use asd OXYCODONE HCL 5 MG TABS (OXYCODONE HCL) 1 by mouth q 6 hrs as needed pain NYSTATIN 100000 UNIT/ML SUSP (NYSTATIN) 5 cc swish and spit four  times per day for 10 days  Current Allergies (reviewed today): ! PCN ! * PNEUMOVAX  Past Medical History:    Reviewed history from 12/12/2007 and no changes required:       bileteral mastectomy/breast ca       svt s/p previous ablations       right hemicolectomy       gi bleed       hemmrroids       dysautonomia       thymus cancer       DVT, hx of - left subclavian       Anxiety       Depression       Hypertension       ibs       Allergic rhinitis       Colonic polyps, hx of       shy-drager syndrome - per card        Hyperlipidemia       Hypothyroidism       vit D deficiency  Past Surgical History:    Reviewed history from 04/04/2007 and no changes required:       Hysterectomy       Oophorectomy       Cholecystectomy       spigellian hernia       Multiple Ablations       Bilateral mastectomy/reconstructioin       C-Section   Social History:    Reviewed history from 05/06/2007 and no changes required:       Never Smoked       Alcohol use-no       Married    Review of Systems       all otherwise negative , denies hyper or hypothyroid symptoms   Physical Exam  General:     alert and overweight-appearing.   Head:     Normocephalic and atraumatic without obvious abnormalities. No apparent alopecia or balding. Eyes:     No corneal or conjunctival inflammation noted. EOMI. Perrla. Ears:     bilat tm's ok, sinus nontender Nose:     nasal dischargemucosal pallor and mucosal erythema.   Mouth:     pharyngeal erythema and fair dentition.  , tongue with some fissures and mild tongue swelling and marked tender, mild erythema Neck:     supple and no masses.   Lungs:     normal respiratory effort and normal breath sounds.   Heart:     normal rate and regular rhythm.   Extremities:     no edema, no ulcers     Impression & Recommendations:  Problem # 1:  GLOSSITIS (ICD-529.0) tx with nystatin susp asd; consider duke's magic mouthwash  Problem # 2:  URI (ICD-465.9)  The following medications were removed from the medication list:    Tussionex Pennkinetic Er 8-10 Mg/82ml Lqcr (Chlorpheniramine-hydrocodone) .[name]... 1 tsp by mouth two times a day as needed cough c/w viral - ok to treat as above, f/u any worsening signs or symptoms   Problem # 3:  HYPOTHYROIDISM (ICD-244.9)  Her updated medication list for this problem includes:    Levothyroxine Sodium 50 Mcg Tabs (Levothyroxine sodium) .[name]... 1 by mouth once daily Continue all medications that you may have been taking previously - to  check TSH next wk - too soon today, tolerating well Labs Reviewed: TSH: 11.54 (02/11/2008)    Chol: 155 (02/11/2008)   HDL: 51.3 (02/11/2008)   LDL: 73 (02/11/2008)  TG: 156 (02/11/2008)   Problem # 4:  VITAMIN D DEFICIENCY (ICD-268.9) d/w pt - to cont OTC suppelement  Complete Medication List: 1)  Acetaminophen-codeine #3 300-30 Mg Tabs (Acetaminophen-codeine) .... Take 1 tablet by mouth four times a day as needed 2)  Effexor Xr 150 Mg Cp24 (Venlafaxine hcl) .[name].. 1 by mouth once daily 3)  Atenolol 25 Mg Tabs (Atenolol) .[name].. 1 by mouth bid 4)  Zofran Odt 4 Mg Tbdp (Ondansetron) .[name].. 1 by mouth qid prn 5)  Bentyl 10 Mg Caps (Dicyclomine hcl) .[name].. 1 - 2 by mouth qid prn 6)  Fluticasone Propionate 50 Mcg/act Susp (Fluticasone propionate) .... 2 spray/side two times a day 7)  Meclizine Hcl 12.5 Mg Tabs (Meclizine hcl) .[name].. 1 by mouth qid prn 8)  Adderall Xr 20 Mg Cp24 (Amphetamine-dextroamphetamine) .[name].. 1 by mouth qd 9)  Protonix 40 Mg Tbec (Pantoprazole sodium) .[name].. 1 by mouth two times a day 10)  Alprazolam 0.5 Mg Tabs (Alprazolam) .[name].. 1 by mouth three times a day as needed nerves 11)  Lipitor 40 Mg Tabs (Atorvastatin calcium) .[name].. 1 by mouth once daily 12)  Symbicort 160-4.5 Mcg/act Aero (Budesonide-formoterol fumarate) .... 2 puffs twice per day 13)  Xopenex Hfa 45 Mcg/act Aero (Levalbuterol tartrate) .... 2 puffs four times per day as needed for wheezing 14)  Levothyroxine Sodium 50 Mcg Tabs (Levothyroxine sodium) .[name].. 1 by mouth once daily 15)  Medrol (pak) 4 Mg Tabs (Methylprednisolone) .... Use asd 16)  Oxycodone Hcl 5 Mg Tabs (Oxycodone hcl) .[name].. 1 by mouth q 6 hrs as needed pain 17)  Nystatin 100000 Unit/ml Susp (Nystatin) .... 5 cc swish and spit four times per day for 10 days   Patient Instructions: 1)  use the nystatinn for the tongue and mouth as directed 2)  Continue all medications that you may have been taking previously, except rinse the mouth after every time to use  the symbicort 3)  You can also use Mucinex OTC for congestion  4)  call in 7 to 10 days for the duke's magic mouthwash if not improving 5)  Please schedule a follow-up appointment as needed.   Prescriptions: NYSTATIN 100000 UNIT/ML SUSP (NYSTATIN) 5 cc swish and spit four times per day for 10 days  #1 bottle x 1   Entered and Authorized by:   Corwin Levins MD   Signed by:   Corwin Levins MD on 03/06/2008   Method used:   Print then Give to Patient   RxID:   [id number]

## 2010-02-01 NOTE — Progress Notes (Signed)
Summary: ATB changed?  Phone Note Call from Patient Call back at [phone]   Caller: Patient Reason for Call: Talk to Doctor Summary of Call: Pt left message on triage asking for ATB given on Thursday to be changed. She states that she still had a fever over the weekend and is not feeling any better--please advise Initial call taken by: Brenton Grills MA,  July 26, 2009 9:58 AM  Follow-up for Phone Call        ok to change to septra course - done per emr Follow-up by: Corwin Levins MD,  July 26, 2009 12:49 PM  Additional Follow-up for Phone Call Additional follow up Details #1::        pt notified Additional Follow-up by: Brenton Grills MA,  July 26, 2009 1:13 PM    New/Updated Medications: SEPTRA DS 800-160 MG TABS (SULFAMETHOXAZOLE-TRIMETHOPRIM) 1po two times a day Prescriptions: SEPTRA DS 800-160 MG TABS (SULFAMETHOXAZOLE-TRIMETHOPRIM) 1po two times a day  #20 x 0   Entered and Authorized by:   Corwin Levins MD   Signed by:   Corwin Levins MD on 07/26/2009   Method used:   Electronically to        [facility]  [phone]* (retail)       [address] [place], [place]  [address]       Ph: [phone] or [phone]       Fax: [phone]   RxID:   [id number]

## 2010-02-01 NOTE — Assessment & Plan Note (Signed)
Summary: FEVER/ EAR INFECTION/NWS   Vital Signs:  Patient profile:   55 year old female Height:      69 inches O2 Sat:      99 % on Room air Temp:     97.9 degrees F oral Pulse rate:   75 / minute BP sitting:   140 / 82  (left arm) Cuff size:   regular  Vitals Entered ByZella Ball Ewing (March 19, 2009 11:54 AM)  O2 Flow:  Room air CC: Ear pain, refills/RE   CC:  Ear pain and refills/RE.  History of Present Illness: here with acute onset 2 days severe bilat earache , pressure, decreased hearing left more right;  without veritgo or n/v or significant sinus or throat symtpoms;  + fever and slight cough nonprod but Pt denies CP, sob, doe, wheezing, orthopnea, pnd, worsening LE edema, palps, dizziness or syncope   Pt denies new neuro symptoms such as headache, facial or extremity weakness  No worsening depressive symtpoms or suicidal ideation, or panic.    Problems Prior to Update: 1)  Add  (ICD-314.00) 2)  Cellulitis, Face  (ICD-682.0) 3)  Sinusitis- Acute-nos  (ICD-461.9) 4)  Cellulitis, Leg, Left  (ICD-682.6) 5)  Wheezing  (ICD-786.07) 6)  Angioedema  (ICD-995.1) 7)  Contusion of Unspecified Site  (ICD-924.9) 8)  Vitamin D Deficiency  (ICD-268.9) 9)  Hypothyroidism  (ICD-244.9) 10)  Uri  (ICD-465.9) 11)  Glossitis  (ICD-529.0) 12)  Rash-nonvesicular  (ICD-782.1) 13)  Preventive Health Care  (ICD-V70.0) 14)  Asthmatic Bronchitis, Acute  (ICD-466.0) 15)  Sinusitis- Acute-nos  (ICD-461.9) 16)  Otitis Media, Acute, Bilateral  (ICD-382.9) 17)  Hyperlipidemia  (ICD-272.4) 18)  Hematemesis  (ICD-578.0) 19)  Abdominal Pain, Epigastric  (ICD-789.06) 20)  Sinusitis- Acute-nos  (ICD-461.9) 21)  Wound, Open, Leg, Without Complication  (ICD-891.0) 22)  Ear Pain, Bilateral  (ICD-388.70) 23)  Cellulitis, Leg, Right  (ICD-682.6) 24)  Uri  (ICD-465.9) 25)  Syncope  (ICD-780.2) 26)  Neck Mass  (ICD-784.2) 27)  Chest Pain  (ICD-786.50) 28)  Rectal Bleeding  (ICD-569.3) 29)  Hemorrhoids   (ICD-455.6) 30)  Headache, Chronic  (ICD-784.0) 31)  Gi Bleeding  (ICD-578.9) 32)  Dvt  (ICD-453.40) 33)  Anxiety Depression  (ICD-300.4) 34)  Ibs  (ICD-564.1) 35)  Colonic Polyps  (ICD-211.3) 36)  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) 37)  Vertigo  (ICD-780.4) 38)  Abdominal Pain, Right Lower Quadrant  (ICD-789.03) 39)  Abdominal Pain, Right Upper Quadrant  (ICD-789.01) 40)  Sinusitis- Acute-nos  (ICD-461.9) 41)  Adiposity, Localized  (ICD-278.1) 42)  Fatty Liver Disease, Hx of  (ICD-V12.79) 43)  Cyst, Ovarian Nec/nos  (ICD-620.2) 44)  Colonic Polyps, Hx of  (ICD-V12.72) 45)  Neop, Malignant, Female Breast Nos  (ICD-174.9) 46)  Neop, Malignant, Thymus  (ICD-164.0) 47)  Allergic Rhinitis  (ICD-477.9) 48)  Irritable Bowel Syndrome, Hx of  (ICD-V12.79) 49)  Hypertension  (ICD-401.9) 50)  Depression  (ICD-311) 51)  Anxiety  (ICD-300.00) 52)  Dvt, Hx of  (ICD-V12.51) 53)  Colectomy, Partial, With Anastomosis, Hx of  (ICD-V15.2) 54)  Dysautonomia  (ICD-742.8)  Medications Prior to Update: 1)  Effexor Xr 150 Mg Cp24 (Venlafaxine Hcl) .[name].. 1 By Mouth Once Daily 2)  Levothyroxine Sodium 50 Mcg Tabs (Levothyroxine Sodium) .[name].. 1 By Mouth Once Daily 3)  Medrol (Pak) 4 Mg Tabs (Methylprednisolone) .... Use Asd 4)  Losartan Potassium 100 Mg Tabs (Losartan Potassium) .[name].. 1 By Mouth Once Daily 5)  Celebrex 200 Mg Caps (Celecoxib) .[name].. 1 By Mouth Two  Times A Day As Needed 6)  Tylenol With Codeine #3 300-30 Mg Tabs (Acetaminophen-Codeine) .[name].. 1 By Mouth Four Times Per Day As Needed 7)  Minocycline Hcl 100 Mg Caps (Minocycline Hcl) .[name].. 1po Once Daily 8)  Zolpidem Tartrate 10 Mg Tabs (Zolpidem Tartrate) .[name].. 1po At Bedtime As Needed 9)  Amphetamine-Dextroamphetamine 20 Mg Tabs (Amphetamine-Dextroamphetamine) .... Take One Tablet Two Times A Day - To Fill Mar 16, 2009  Current Medications (verified): 1)  Effexor Xr 150 Mg Cp24 (Venlafaxine Hcl) .[name].. 1 By Mouth Once Daily 2)  Levothyroxine Sodium  50 Mcg Tabs (Levothyroxine Sodium) .[name].. 1 By Mouth Once Daily 3)  Losartan Potassium 100 Mg Tabs (Losartan Potassium) .[name].. 1 By Mouth Once Daily 4)  Celebrex 200 Mg Caps (Celecoxib) .[name].. 1 By Mouth Two Times A Day As Needed 5)  Tylenol With Codeine #3 300-30 Mg Tabs (Acetaminophen-Codeine) .[name].. 1 By Mouth Four Times Per Day As Needed 6)  Minocycline Hcl 100 Mg Caps (Minocycline Hcl) .[name].. 1po Once Daily 7)  Zolpidem Tartrate 10 Mg Tabs (Zolpidem Tartrate) .[name].. 1po At Bedtime As Needed 8)  Amphetamine-Dextroamphetamine 20 Mg Tabs (Amphetamine-Dextroamphetamine) .... Take One Tablet Two Times A Day - To Fill Mar 16, 2009 9)  Doxycycline Hyclate 100 Mg Caps (Doxycycline Hyclate) .[name].. 1po Two Times A Day 10)  Promethazine Hcl 25 Mg Tabs (Promethazine Hcl) .... By Mouth Q 6 Hrs As Needed Nausea 11)  Meclizine Hcl 12.5 Mg Tabs (Meclizine Hcl) .[name].. 1po Q 6 Hrs As Needed Dizziness  Allergies (verified): 1)  ! Pcn 2)  ! * Pneumovax  Past History:  Past Medical History: Last updated: 01/20/2009 bileteral mastectomy/breast ca svt s/p previous ablations right hemicolectomy gi bleed hemmrroids dysautonomia thymus cancer DVT, hx of - left subclavian Anxiety Depression Hypertension ibs Allergic rhinitis Colonic polyps, hx of shy-drager syndrome - per card Hyperlipidemia Hypothyroidism vit D deficiency ADD  Past Surgical History: Last updated: 04/04/2007 Hysterectomy Oophorectomy Cholecystectomy spigellian hernia Multiple Ablations Bilateral mastectomy/reconstructioin C-Section  Social History: Last updated: 05/06/2007 Never Smoked Alcohol use-no Married  Risk Factors: Smoking Status: never (10/15/2006)  Review of Systems       all otherwise negative per pt -    Physical Exam  General:  alert and overweight-appearing.  , mild ill  Head:  normocephalic and atraumatic.   Eyes:  vision grossly intact, pupils equal, and pupils round.   Ears:  bilat tm's erythema , left >  right with bulging on the left, no canal red or sweling or d/c Nose:  nasal dischargemucosal pallor and mucosal edema.   Mouth:  pharyngeal erythema and fair dentition.   Neck:  supple and no masses.   Lungs:  normal respiratory effort and normal breath sounds.   Heart:  normal rate and regular rhythm.   Extremities:  no edema, no erythema  Psych:  not depressed appearing and moderately anxious.     Impression & Recommendations:  Problem # 1:  OTITIS MEDIA, ACUTE, BILATERAL (ICD-382.9)  Her updated medication list for this problem includes:    Celebrex 200 Mg Caps (Celecoxib) .[name]... 1 by mouth two times a day as needed    Minocycline Hcl 100 Mg Caps (Minocycline hcl) .[name]... 1po once daily    Doxycycline Hyclate 100 Mg Caps (Doxycycline hyclate) .[name]... 1po two times a day left more than right, treat as above, f/u any worsening signs or symptoms , also for mucinex otc as needed   Problem # 2:  HYPERTENSION (ICD-401.9)  Her updated medication  list for this problem includes:    Losartan Potassium 100 Mg Tabs (Losartan potassium) .[name]... 1 by mouth once daily  BP today: 140/82 Prior BP: 112/78 (01/20/2009)  Labs Reviewed: K+: 3.8 (02/11/2008) Creat: : 0.9 (02/11/2008)   Chol: 155 (02/11/2008)   HDL: 51.3 (02/11/2008)   LDL: 73 (02/11/2008)   TG: 156 (02/11/2008) mild elev today, likely situational, ok to follow, continue same treatment   Problem # 3:  ANXIETY DEPRESSION (ICD-300.4) stable overall by hx and exam, ok to continue meds/tx as is   Complete Medication List: 1)  Effexor Xr 150 Mg Cp24 (Venlafaxine hcl) .[name].. 1 by mouth once daily 2)  Levothyroxine Sodium 50 Mcg Tabs (Levothyroxine sodium) .[name].. 1 by mouth once daily 3)  Losartan Potassium 100 Mg Tabs (Losartan potassium) .[name].. 1 by mouth once daily 4)  Celebrex 200 Mg Caps (Celecoxib) .[name].. 1 by mouth two times a day as needed 5)  Tylenol With Codeine #3 300-30 Mg Tabs (Acetaminophen-codeine) .[name].. 1 by mouth four times per day as  needed 6)  Minocycline Hcl 100 Mg Caps (Minocycline hcl) .[name].. 1po once daily 7)  Zolpidem Tartrate 10 Mg Tabs (Zolpidem tartrate) .[name].. 1po at bedtime as needed 8)  Amphetamine-dextroamphetamine 20 Mg Tabs (Amphetamine-dextroamphetamine) .... Take one tablet two times a day - to fill Mar 16, 2009 9)  Doxycycline Hyclate 100 Mg Caps (Doxycycline hyclate) .[name].. 1po two times a day 10)  Promethazine Hcl 25 Mg Tabs (Promethazine hcl) .... By mouth q 6 hrs as needed nausea 11)  Meclizine Hcl 12.5 Mg Tabs (Meclizine hcl) .[name].. 1po q 6 hrs as needed dizziness  Other Orders: Depo- Medrol 80mg  (J1040) Admin of Therapeutic Inj  intramuscular or subcutaneous (34742)  Patient Instructions: 1)  Please take all new medications as prescribed 2)  Continue all previous medications as before this visit  3)  You can also use Mucinex OTC or it's generic for congestion  4)  Please schedule a follow-up appointment in 3 months with CPX labs Prescriptions: LEVOTHYROXINE SODIUM 50 MCG TABS (LEVOTHYROXINE SODIUM) 1 by mouth once daily  #90 x 2   Entered and Authorized by:   Corwin Levins MD   Signed by:   Corwin Levins MD on 03/19/2009   Method used:   Print then Give to Patient   RxID:   [id number] MECLIZINE HCL 12.5 MG TABS (MECLIZINE HCL) 1po q 6 hrs as needed dizziness  #40 x 1   Entered and Authorized by:   Corwin Levins MD   Signed by:   Corwin Levins MD on 03/19/2009   Method used:   Print then Give to Patient   RxID:   [id number] PROMETHAZINE HCL 25 MG TABS (PROMETHAZINE HCL) by mouth q 6 hrs as needed nausea  #40 x 0   Entered and Authorized by:   Corwin Levins MD   Signed by:   Corwin Levins MD on 03/19/2009   Method used:   Print then Give to Patient   RxID:   [id number] DOXYCYCLINE HYCLATE 100 MG CAPS (DOXYCYCLINE HYCLATE) 1po two times a day  #20 x 0   Entered and Authorized by:   Corwin Levins MD   Signed by:   Corwin Levins MD on 03/19/2009   Method used:   Print then Give to  Patient   RxID:   [id number]    Medication Administration  Injection # 1:    Medication: Depo- Medrol 80mg     Diagnosis: ASTHMATIC  BRONCHITIS, ACUTE (ICD-466.0)    Route: IM    Site: LUOQ gluteus    Exp Date: 09/03/2011    Lot #: [id number]    Mfr: [employer]    Patient tolerated injection without complications    Given by: Lucious Groves (March 19, 2009 12:22 PM)  Injection # 2:    Medication: Depo- Medrol 40mg     Diagnosis: ASTHMATIC BRONCHITIS, ACUTE (ICD-466.0)    Route: IM    Site: LUOQ gluteus    Exp Date: 09/03/2011    Lot #: [id number]    Mfr: [employer]    Patient tolerated injection without complications    Given by: Lucious Groves (March 19, 2009 12:23 PM)  Orders Added: 1)  Depo- Medrol 80mg  [J1040] 2)  Admin of Therapeutic Inj  intramuscular or subcutaneous [[id number]] 3)  Est. Patient Level IV [71062]

## 2010-02-01 NOTE — Progress Notes (Signed)
Summary: RX refill  Phone Note Call from Patient Call back at [facility] Phone [phone]   Caller: Spouse [phone] / [phone] Summary of Call: pt's spouse called requesting refill of pt's Amphetamine 20mg  tab. Initial call taken by: Margaret Pyle, CMA,  March 16, 2009 9:39 AM  Follow-up for Phone Call        done hardcopy to LIM side B - dahlia  Follow-up by: Corwin Levins MD,  March 16, 2009 12:56 PM  Additional Follow-up for Phone Call Additional follow up Details #1::        pt's spouse informed Additional Follow-up by: Margaret Pyle, CMA,  March 16, 2009 1:31 PM    New/Updated Medications: AMPHETAMINE-DEXTROAMPHETAMINE 20 MG TABS (AMPHETAMINE-DEXTROAMPHETAMINE) take one tablet two times a day - to fill Mar 16, 2009 Prescriptions: AMPHETAMINE-DEXTROAMPHETAMINE 20 MG TABS (AMPHETAMINE-DEXTROAMPHETAMINE) take one tablet two times a day - to fill Mar 16, 2009  #60 x 0   Entered and Authorized by:   Corwin Levins MD   Signed by:   Corwin Levins MD on 03/16/2009   Method used:   Print then Give to Patient   RxID:   [id number]

## 2010-02-01 NOTE — Progress Notes (Signed)
Summary: Medication refill  Phone Note Refill Request  on Jun 01, 2009 10:05 AM  Refills Requested: Medication #1:  TYLENOL WITH CODEINE #3 300-30 MG TABS 1 by mouth four times per day as needed   Dosage confirmed as above?Dosage Confirmed   [facility]  [place], [phone] Initial call taken by: Scharlene Gloss,  Jun 01, 2009 10:06 AM    New/Updated Medications: TYLENOL WITH CODEINE #3 300-30 MG TABS (ACETAMINOPHEN-CODEINE) 1 by mouth four times per day as needed Prescriptions: TYLENOL WITH CODEINE #3 300-30 MG TABS (ACETAMINOPHEN-CODEINE) 1 by mouth four times per day as needed  #120 x 2   Entered and Authorized by:   Corwin Levins MD   Signed by:   Corwin Levins MD on 06/01/2009   Method used:   Print then Give to Patient   RxID:   [id number]  done hardcopy to LIM side B - dahlia  Corwin Levins MD  Jun 01, 2009 10:21 AM   Rx faxed to pharmacy Margaret Pyle, CMA  Jun 01, 2009 10:58 AM

## 2010-02-01 NOTE — Letter (Signed)
Summary: [facility] Office Visit Note  [facility] Office Visit Note   Imported By: Roderic Ovens 06/29/2009 15:33:40  _____________________________________________________________________  External Attachment:    Type:   Image     Comment:   External Document

## 2010-02-01 NOTE — Assessment & Plan Note (Signed)
Summary: EAR PAIN---STC   Vital Signs:  Patient profile:   55 year old female Height:      69 inches Weight:      001 pounds BMI:     0.15 O2 Sat:      97 % on Room air Temp:     97.7 degrees F oral Pulse rate:   86 / minute BP sitting:   140 / 90  (left arm) Cuff size:   regular  Vitals Entered ByZella Ball Ewing (May 26, 2009 11:24 AM)  O2 Flow:  Room air CC: Ear pain/RE   CC:  Ear pain/RE.  History of Present Illness: here with 2 wks onset increased bialt ear pain with congestion, fever, mildhearing loss and mild dizziness.  No headache, ST, cough and Pt denies CP, sob, doe, wheezing, orthopnea, pnd, worsening LE edema, palps, dizziness or syncope   Alli with ongoing several months nasal allergy symptoms with clearish d/c, and ear popping and fullness.    Preventive Screening-Counseling & Management      Drug Use:  no.    Problems Prior to Update: 1)  Add  (ICD-314.00) 2)  Cellulitis, Face  (ICD-682.0) 3)  Sinusitis- Acute-nos  (ICD-461.9) 4)  Cellulitis, Leg, Left  (ICD-682.6) 5)  Wheezing  (ICD-786.07) 6)  Angioedema  (ICD-995.1) 7)  Contusion of Unspecified Site  (ICD-924.9) 8)  Vitamin D Deficiency  (ICD-268.9) 9)  Hypothyroidism  (ICD-244.9) 10)  Uri  (ICD-465.9) 11)  Glossitis  (ICD-529.0) 12)  Rash-nonvesicular  (ICD-782.1) 13)  Preventive Health Care  (ICD-V70.0) 14)  Asthmatic Bronchitis, Acute  (ICD-466.0) 15)  Sinusitis- Acute-nos  (ICD-461.9) 16)  Otitis Media, Acute, Bilateral  (ICD-382.9) 17)  Hyperlipidemia  (ICD-272.4) 18)  Hematemesis  (ICD-578.0) 19)  Abdominal Pain, Epigastric  (ICD-789.06) 20)  Sinusitis- Acute-nos  (ICD-461.9) 21)  Wound, Open, Leg, Without Complication  (ICD-891.0) 22)  Ear Pain, Bilateral  (ICD-388.70) 23)  Cellulitis, Leg, Right  (ICD-682.6) 24)  Uri  (ICD-465.9) 25)  Syncope  (ICD-780.2) 26)  Neck Mass  (ICD-784.2) 27)  Chest Pain  (ICD-786.50) 28)  Rectal Bleeding  (ICD-569.3) 29)  Hemorrhoids  (ICD-455.6) 30)   Headache, Chronic  (ICD-784.0) 31)  Gi Bleeding  (ICD-578.9) 32)  Dvt  (ICD-453.40) 33)  Anxiety Depression  (ICD-300.4) 34)  Ibs  (ICD-564.1) 35)  Colonic Polyps  (ICD-211.3) 36)  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) 37)  Vertigo  (ICD-780.4) 38)  Abdominal Pain, Right Lower Quadrant  (ICD-789.03) 39)  Abdominal Pain, Right Upper Quadrant  (ICD-789.01) 40)  Sinusitis- Acute-nos  (ICD-461.9) 41)  Adiposity, Localized  (ICD-278.1) 42)  Fatty Liver Disease, Hx of  (ICD-V12.79) 43)  Cyst, Ovarian Nec/nos  (ICD-620.2) 44)  Colonic Polyps, Hx of  (ICD-V12.72) 45)  Neop, Malignant, Female Breast Nos  (ICD-174.9) 46)  Neop, Malignant, Thymus  (ICD-164.0) 47)  Allergic Rhinitis  (ICD-477.9) 48)  Irritable Bowel Syndrome, Hx of  (ICD-V12.79) 49)  Hypertension  (ICD-401.9) 50)  Depression  (ICD-311) 51)  Anxiety  (ICD-300.00) 52)  Dvt, Hx of  (ICD-V12.51) 53)  Colectomy, Partial, With Anastomosis, Hx of  (ICD-V15.2) 54)  Dysautonomia  (ICD-742.8)  Medications Prior to Update: 1)  Effexor Xr 150 Mg Cp24 (Venlafaxine Hcl) .[name].. 1 By Mouth Once Daily 2)  Levothyroxine Sodium 50 Mcg Tabs (Levothyroxine Sodium) .[name].. 1 By Mouth Once Daily 3)  Losartan Potassium 100 Mg Tabs (Losartan Potassium) .[name].. 1 By Mouth Once Daily 4)  Celebrex 200 Mg Caps (Celecoxib) .[name].. 1 By Mouth Two Times A Day  As Needed 5)  Tylenol With Codeine #3 300-30 Mg Tabs (Acetaminophen-Codeine) .[name].. 1 By Mouth Four Times Per Day As Needed 6)  Minocycline Hcl 100 Mg Caps (Minocycline Hcl) .[name].. 1po Once Daily 7)  Zolpidem Tartrate 10 Mg Tabs (Zolpidem Tartrate) .[name].. 1po At Bedtime As Needed 8)  Amphetamine-Dextroamphetamine 20 Mg Tabs (Amphetamine-Dextroamphetamine) .... Take One Tablet Two Times A Day - To Fill June 14, 2009 9)  Doxycycline Hyclate 100 Mg Caps (Doxycycline Hyclate) .[name].. 1po Two Times A Day 10)  Promethazine Hcl 25 Mg Tabs (Promethazine Hcl) .... By Mouth Q 6 Hrs As Needed Nausea 11)  Meclizine Hcl 12.5 Mg Tabs  (Meclizine Hcl) .[name].. 1po Q 6 Hrs As Needed Dizziness  Current Medications (verified): 1)  Effexor Xr 150 Mg Cp24 (Venlafaxine Hcl) .[name].. 1 By Mouth Once Daily 2)  Levothyroxine Sodium 50 Mcg Tabs (Levothyroxine Sodium) .[name].. 1 By Mouth Once Daily 3)  Losartan Potassium 100 Mg Tabs (Losartan Potassium) .[name].. 1 By Mouth Once Daily 4)  Celebrex 200 Mg Caps (Celecoxib) .[name].. 1 By Mouth Two Times A Day As Needed 5)  Tylenol With Codeine #3 300-30 Mg Tabs (Acetaminophen-Codeine) .[name].. 1 By Mouth Four Times Per Day As Needed 6)  Minocycline Hcl 100 Mg Caps (Minocycline Hcl) .[name].. 1po Once Daily 7)  Zolpidem Tartrate 10 Mg Tabs (Zolpidem Tartrate) .[name].. 1po At Bedtime As Needed 8)  Amphetamine-Dextroamphetamine 20 Mg Tabs (Amphetamine-Dextroamphetamine) .... Take One Tablet Two Times A Day - To Fill June 14, 2009 9)  Doxycycline Hyclate 100 Mg Caps (Doxycycline Hyclate) .[name].. 1po Two Times A Day 10)  Promethazine Hcl 25 Mg Tabs (Promethazine Hcl) .... By Mouth Q 6 Hrs As Needed Nausea 11)  Meclizine Hcl 12.5 Mg Tabs (Meclizine Hcl) .[name].. 1po Q 6 Hrs As Needed Dizziness 12)  Fexofenadine Hcl 180 Mg Tabs (Fexofenadine Hcl) .[name].. 1po Once Daily As Needed Allergies 13)  Fluticasone Propionate 50 Mcg/act Susp (Fluticasone Propionate) .... 2 Spray/side Once Daily  Allergies (verified): 1)  ! Pcn 2)  ! * Pneumovax  Past History:  Past Surgical History: Last updated: 04/04/2007 Hysterectomy Oophorectomy Cholecystectomy spigellian hernia Multiple Ablations Bilateral mastectomy/reconstructioin C-Section  Social History: Last updated: 05/26/2009 Never Smoked Alcohol use-no Married Drug use-no  Risk Factors: Smoking Status: never (10/15/2006)  Past Medical History: bileteral mastectomy/breast ca svt s/p previous ablations right hemicolectomy gi bleed hemmrroids dysautonomia thymus cancer DVT, hx of - left subclavian Anxiety Depression Hypertension ibs Allergic rhinitis Colonic polyps, hx  of shy-drager syndrome - per card Hyperlipidemia Hypothyroidism vit D deficiency ADD  Social History: Reviewed history from 05/06/2007 and no changes required. Never Smoked Alcohol use-no Married Drug use-no Drug Use:  no  Review of Systems       all otherwise negative per pt -    Physical Exam  General:  alert and overweight-appearing.   Head:  normocephalic and atraumatic.   Eyes:  vision grossly intact, pupils equal, and pupils round.   Ears:  bilat tm's mild red, sinus nontender Nose:  no external deformity and no nasal discharge.   Mouth:  pharyngeal erythema and fair dentition.   Neck:  supple and no masses.   Lungs:  normal respiratory effort and normal breath sounds.   Heart:  normal rate and regular rhythm.   Extremities:  no edema, no erythema    Impression & Recommendations:  Problem # 1:  OTITIS MEDIA, ACUTE, BILATERAL (ICD-382.9)  Her updated medication list for this problem includes:    Celebrex 200 Mg Caps (  Celecoxib) .[name]... 1 by mouth two times a day as needed    Minocycline Hcl 100 Mg Caps (Minocycline hcl) .[name]... 1po once daily    Doxycycline Hyclate 100 Mg Caps (Doxycycline hyclate) .[name]... 1po two times a day treat as above, f/u any worsening signs or symptoms   Problem # 2:  ALLERGIC RHINITIS (ICD-477.9)  Her updated medication list for this problem includes:    Promethazine Hcl 25 Mg Tabs (Promethazine hcl) ..... By mouth q 6 hrs as needed nausea    Fexofenadine Hcl 180 Mg Tabs (Fexofenadine hcl) .[name]... 1po once daily as needed allergies    Fluticasone Propionate 50 Mcg/act Susp (Fluticasone propionate) .[name]... 2 spray/side once daily treat as above, f/u any worsening signs or symptoms , also for depomedrol IM today  Problem # 3:  DYSFUNCTION OF EUSTACHIAN TUBE (ICD-381.81) for tx as above, as well as valsalva maneuver as needed, mucinex as needed, otc claritin or allegra as needed , nettie pott treatments  Problem # 4:  HYPERTENSION  (ICD-401.9)  Her updated medication list for this problem includes:    Losartan Potassium 100 Mg Tabs (Losartan potassium) .[name]... 1 by mouth once daily  BP today: 140/90 Prior BP: 140/82 (03/19/2009)  Labs Reviewed: K+: 3.8 (02/11/2008) Creat: : 0.9 (02/11/2008)   Chol: 155 (02/11/2008)   HDL: 51.3 (02/11/2008)   LDL: 73 (02/11/2008)   TG: 156 (02/11/2008) stable overall by hx and exam, ok to continue meds/tx as is   Complete Medication List: 1)  Effexor Xr 150 Mg Cp24 (Venlafaxine hcl) .[name].. 1 by mouth once daily 2)  Levothyroxine Sodium 50 Mcg Tabs (Levothyroxine sodium) .[name].. 1 by mouth once daily 3)  Losartan Potassium 100 Mg Tabs (Losartan potassium) .[name].. 1 by mouth once daily 4)  Celebrex 200 Mg Caps (Celecoxib) .[name].. 1 by mouth two times a day as needed 5)  Tylenol With Codeine #3 300-30 Mg Tabs (Acetaminophen-codeine) .[name].. 1 by mouth four times per day as needed 6)  Minocycline Hcl 100 Mg Caps (Minocycline hcl) .[name].. 1po once daily 7)  Zolpidem Tartrate 10 Mg Tabs (Zolpidem tartrate) .[name].. 1po at bedtime as needed 8)  Amphetamine-dextroamphetamine 20 Mg Tabs (Amphetamine-dextroamphetamine) .... Take one tablet two times a day - to fill June 14, 2009 9)  Doxycycline Hyclate 100 Mg Caps (Doxycycline hyclate) .[name].. 1po two times a day 10)  Promethazine Hcl 25 Mg Tabs (Promethazine hcl) .... By mouth q 6 hrs as needed nausea 11)  Meclizine Hcl 12.5 Mg Tabs (Meclizine hcl) .[name].. 1po q 6 hrs as needed dizziness 12)  Fexofenadine Hcl 180 Mg Tabs (Fexofenadine hcl) .[name].. 1po once daily as needed allergies 13)  Fluticasone Propionate 50 Mcg/act Susp (Fluticasone propionate) .... 2 spray/side once daily  Other Orders: Depo- Medrol 40mg  (J1030) Admin of Therapeutic Inj  intramuscular or subcutaneous ([id number])  Patient Instructions: 1)  you had the steroid shot today 2)  Please take all new medications as prescribed  - the antibiotic 3)  also, you should consider taking on a regular basis the  nasal steroid, and the antihistamine, use the nettie pott and the mucinex in order to avoid the ear recurrant problem (eustachain valve dysfunction) 4)  Please schedule a follow-up appointment as needed. Prescriptions: FLUTICASONE PROPIONATE 50 MCG/ACT SUSP (FLUTICASONE PROPIONATE) 2 spray/side once daily  #1 x 11   Entered and Authorized by:   Corwin Levins MD   Signed by:   Corwin Levins MD on 05/26/2009   Method used:   Print then  Give to Patient   RxID:   [id number] FEXOFENADINE HCL 180 MG TABS (FEXOFENADINE HCL) 1po once daily as needed allergies  #30 x 11   Entered and Authorized by:   Corwin Levins MD   Signed by:   Corwin Levins MD on 05/26/2009   Method used:   Print then Give to Patient   RxID:   [id number] DOXYCYCLINE HYCLATE 100 MG CAPS (DOXYCYCLINE HYCLATE) 1po two times a day  #20 x 0   Entered and Authorized by:   Corwin Levins MD   Signed by:   Corwin Levins MD on 05/26/2009   Method used:   Print then Give to Patient   RxID:   [id number]    Medication Administration  Injection # 1:    Medication: Depo- Medrol 80mg     Diagnosis: SINUSITIS- ACUTE-NOS (ICD-461.9)    Route: IM    Site: LUOQ gluteus    Exp Date: 04/03/2011    Lot #: [id number]    Mfr: [employer]    Patient tolerated injection without complications    Given by: Lamar Sprinkles, CMA (May 26, 2009 12:18 PM)  Injection # 2:    Medication: Depo- Medrol 40mg     Diagnosis: SINUSITIS- ACUTE-NOS (ICD-461.9)    Comments: Same as above    Patient tolerated injection without complications    Given by: Lamar Sprinkles, CMA (May 26, 2009 12:18 PM)  Orders Added: 1)  Depo- Medrol 40mg  [J1030] 2)  Admin of Therapeutic Inj  intramuscular or subcutaneous [[id number]] 3)  Est. Patient Level IV [01027]

## 2010-02-01 NOTE — Assessment & Plan Note (Signed)
Summary: ears aching---stc   Vital Signs:  Patient profile:   55 year old female Height:      69 inches O2 Sat:      97 % on Room air Temp:     99.1 degrees F oral Pulse rate:   84 / minute BP sitting:   110 / 72  (right arm) Cuff size:   large  Vitals Entered By: Zella Ball Ewing CMA Duncan Dull) (August 12, 2009 4:34 PM)  O2 Flow:  Room air CC: Both ears still hurting,hearing problems, fever/RE   CC:  Both ears still hurting, hearing problems, and fever/RE.  History of Present Illness: here to f/u, unfortunately not improved with recent tx, in fact c/o incr pain to both ears with persistent fever , headache, general weakness and malaise, without ST, cough and Pt denies CP, sob, doe, wheezing, orthopnea, pnd, worsening LE edema, palps, dizziness or syncope   Has mild sinus congestion ongoing with popping to the left ear, without dizziness, vertigo, nausea, or falls.  Pt denies new neuro symptoms such as headache, facial or extremity weakness .  Overall good med complaicne, and good tolerability.  Also menitons she is due for her 10 yr f/u with plastic surgury as rec'd by her last surgeon after bilat mastectomy.  No current breast symptoms.    Problems Prior to Update: 1)  Mastectomy, Bilateral, Hx of  (ICD-V45.71) 2)  Dysfunction of Eustachian Tube  (ICD-381.81) 3)  Add  (ICD-314.00) 4)  Cellulitis, Face  (ICD-682.0) 5)  Sinusitis- Acute-nos  (ICD-461.9) 6)  Cellulitis, Leg, Left  (ICD-682.6) 7)  Wheezing  (ICD-786.07) 8)  Angioedema  (ICD-995.1) 9)  Contusion of Unspecified Site  (ICD-924.9) 10)  Vitamin D Deficiency  (ICD-268.9) 11)  Hypothyroidism  (ICD-244.9) 12)  Uri  (ICD-465.9) 13)  Glossitis  (ICD-529.0) 14)  Rash-nonvesicular  (ICD-782.1) 15)  Preventive Health Care  (ICD-V70.0) 16)  Asthmatic Bronchitis, Acute  (ICD-466.0) 17)  Sinusitis- Acute-nos  (ICD-461.9) 18)  Otitis Media, Acute, Bilateral  (ICD-382.9) 19)  Hyperlipidemia  (ICD-272.4) 20)  Hematemesis   (ICD-578.0) 21)  Abdominal Pain, Epigastric  (ICD-789.06) 22)  Sinusitis- Acute-nos  (ICD-461.9) 23)  Wound, Open, Leg, Without Complication  (ICD-891.0) 24)  Ear Pain, Bilateral  (ICD-388.70) 25)  Cellulitis, Leg, Right  (ICD-682.6) 26)  Uri  (ICD-465.9) 27)  Syncope  (ICD-780.2) 28)  Neck Mass  (ICD-784.2) 29)  Chest Pain  (ICD-786.50) 30)  Rectal Bleeding  (ICD-569.3) 31)  Hemorrhoids  (ICD-455.6) 32)  Headache, Chronic  (ICD-784.0) 33)  Gi Bleeding  (ICD-578.9) 34)  Dvt  (ICD-453.40) 35)  Anxiety Depression  (ICD-300.4) 36)  Ibs  (ICD-564.1) 37)  Colonic Polyps  (ICD-211.3) 38)  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) 39)  Vertigo  (ICD-780.4) 40)  Abdominal Pain, Right Lower Quadrant  (ICD-789.03) 41)  Abdominal Pain, Right Upper Quadrant  (ICD-789.01) 42)  Sinusitis- Acute-nos  (ICD-461.9) 43)  Adiposity, Localized  (ICD-278.1) 44)  Fatty Liver Disease, Hx of  (ICD-V12.79) 45)  Cyst, Ovarian Nec/nos  (ICD-620.2) 46)  Colonic Polyps, Hx of  (ICD-V12.72) 47)  Neop, Malignant, Female Breast Nos  (ICD-174.9) 48)  Neop, Malignant, Thymus  (ICD-164.0) 49)  Allergic Rhinitis  (ICD-477.9) 50)  Irritable Bowel Syndrome, Hx of  (ICD-V12.79) 51)  Hypertension  (ICD-401.9) 52)  Depression  (ICD-311) 53)  Anxiety  (ICD-300.00) 54)  Dvt, Hx of  (ICD-V12.51) 55)  Colectomy, Partial, With Anastomosis, Hx of  (ICD-V15.2) 56)  Dysautonomia  (ICD-742.8)  Medications Prior to Update: 1)  Effexor Xr  150 Mg Cp24 (Venlafaxine Hcl) .[name].. 1 By Mouth Once Daily 2)  Levothyroxine Sodium 50 Mcg Tabs (Levothyroxine Sodium) .[name].. 1 By Mouth Once Daily 3)  Losartan Potassium 100 Mg Tabs (Losartan Potassium) .[name].. 1 By Mouth Once Daily 4)  Celebrex 200 Mg Caps (Celecoxib) .[name].. 1 By Mouth Two Times A Day As Needed 5)  Tylenol With Codeine #3 300-30 Mg Tabs (Acetaminophen-Codeine) .[name].. 1 By Mouth Four Times Per Day As Needed 6)  Minocycline Hcl 100 Mg Caps (Minocycline Hcl) .[name].. 1po Once Daily 7)  Zolpidem  Tartrate 10 Mg Tabs (Zolpidem Tartrate) .[name].. 1po At Bedtime As Needed 8)  Amphetamine-Dextroamphetamine 20 Mg Tabs (Amphetamine-Dextroamphetamine) .... Take One Tablet Two Times A Day - To Fill June 14, 2009 9)  Septra Ds 800-160 Mg Tabs (Sulfamethoxazole-Trimethoprim) .[name].. 1po Two Times A Day 10)  Promethazine Hcl 25 Mg Tabs (Promethazine Hcl) .... By Mouth Q 6 Hrs As Needed Nausea 11)  Meclizine Hcl 12.5 Mg Tabs (Meclizine Hcl) .[name].. 1po Q 6 Hrs As Needed Dizziness 12)  Fexofenadine Hcl 180 Mg Tabs (Fexofenadine Hcl) .[name].. 1po Once Daily As Needed Allergies 13)  Fluticasone Propionate 50 Mcg/act Susp (Fluticasone Propionate) .... 2 Spray/side Once Daily  Current Medications (verified): 1)  Effexor Xr 150 Mg Cp24 (Venlafaxine Hcl) .[name].. 1 By Mouth Once Daily 2)  Levothyroxine Sodium 50 Mcg Tabs (Levothyroxine Sodium) .[name].. 1 By Mouth Once Daily 3)  Losartan Potassium 100 Mg Tabs (Losartan Potassium) .[name].. 1 By Mouth Once Daily 4)  Celebrex 200 Mg Caps (Celecoxib) .[name].. 1 By Mouth Two Times A Day As Needed 5)  Tylenol With Codeine #3 300-30 Mg Tabs (Acetaminophen-Codeine) .[name].. 1 By Mouth Four Times Per Day As Needed 6)  Minocycline Hcl 100 Mg Caps (Minocycline Hcl) .[name].. 1po Once Daily 7)  Zolpidem Tartrate 10 Mg Tabs (Zolpidem Tartrate) .[name].. 1po At Bedtime As Needed 8)  Amphetamine-Dextroamphetamine 20 Mg Tabs (Amphetamine-Dextroamphetamine) .... Take One Tablet Two Times A Day - To Fill June 14, 2009 9)  Ceftin 250 Mg Tabs (Cefuroxime Axetil) .[name].. 1 Po Two Times A Day 10)  Promethazine Hcl 25 Mg Tabs (Promethazine Hcl) .... By Mouth Q 6 Hrs As Needed Nausea 11)  Meclizine Hcl 12.5 Mg Tabs (Meclizine Hcl) .[name].. 1po Q 6 Hrs As Needed Dizziness 12)  Fexofenadine Hcl 180 Mg Tabs (Fexofenadine Hcl) .[name].. 1po Once Daily As Needed Allergies 13)  Fluticasone Propionate 50 Mcg/act Susp (Fluticasone Propionate) .... 2 Spray/side Once Daily  Allergies (verified): 1)  ! Pcn 2)  ! * Pneumovax  Past  History:  Past Medical History: Last updated: 05/26/2009 bileteral mastectomy/breast ca svt s/p previous ablations right hemicolectomy gi bleed hemmrroids dysautonomia thymus cancer DVT, hx of - left subclavian Anxiety Depression Hypertension ibs Allergic rhinitis Colonic polyps, hx of shy-drager syndrome - per card Hyperlipidemia Hypothyroidism vit D deficiency ADD  Past Surgical History: Last updated: 04/04/2007 Hysterectomy Oophorectomy Cholecystectomy spigellian hernia Multiple Ablations Bilateral mastectomy/reconstructioin C-Section  Social History: Last updated: 05/26/2009 Never Smoked Alcohol use-no Married Drug use-no  Risk Factors: Smoking Status: never (10/15/2006)  Review of Systems       all otherwise negative per pt -    Physical Exam  General:  alert and overweight-appearing. , mild ill   Head:  normocephalic and atraumatic.   Eyes:  vision grossly intact, pupils equal, and pupils round.   Ears:  bilat tm's severe erythema, bulging, canals clear Nose:  no external deformity and no nasal discharge.   Mouth:  no gingival abnormalities  and pharynx pink and moist.   Neck:  supple and no masses.   Breasts:  deferred Lungs:  normal respiratory effort and normal breath sounds.   Heart:  normal rate and regular rhythm.   Extremities:  no edema, no erythema    Impression & Recommendations:  Problem # 1:  SINUSITIS- ACUTE-NOS (ICD-461.9)  Her updated medication list for this problem includes:    Minocycline Hcl 100 Mg Caps (Minocycline hcl) .[name]... 1po once daily    Ceftin 250 Mg Tabs (Cefuroxime axetil) .[name]... 1 po two times a day    Fluticasone Propionate 50 Mcg/act Susp (Fluticasone propionate) .[name]... 2 spray/side once daily treat as above, f/u any worsening signs or symptoms   Problem # 2:  ALLERGIC RHINITIS (ICD-477.9)  Her updated medication list for this problem includes:    Promethazine Hcl 25 Mg Tabs (Promethazine hcl) ..... By  mouth q 6 hrs as needed nausea    Fexofenadine Hcl 180 Mg Tabs (Fexofenadine hcl) .[name]... 1po once daily as needed allergies    Fluticasone Propionate 50 Mcg/act Susp (Fluticasone propionate) .[name]... 2 spray/side once daily treat as above, f/u any worsening signs or symptoms ,  should help with the left eustachain valve symtpoms as well   Problem # 3:  MASTECTOMY, BILATERAL, HX OF (ICD-V45.71)  to refer back to plastic surg as pt was instructed at 10 yrs  Orders: Misc. Referral (Misc. Ref)  Problem # 4:  DYSFUNCTION OF EUSTACHIAN TUBE (ICD-381.81) also for mucinex as needed   Complete Medication List: 1)  Effexor Xr 150 Mg Cp24 (Venlafaxine hcl) .[name].. 1 by mouth once daily 2)  Levothyroxine Sodium 50 Mcg Tabs (Levothyroxine sodium) .[name].. 1 by mouth once daily 3)  Losartan Potassium 100 Mg Tabs (Losartan potassium) .[name].. 1 by mouth once daily 4)  Celebrex 200 Mg Caps (Celecoxib) .[name].. 1 by mouth two times a day as needed 5)  Tylenol With Codeine #3 300-30 Mg Tabs (Acetaminophen-codeine) .[name].. 1 by mouth four times per day as needed 6)  Minocycline Hcl 100 Mg Caps (Minocycline hcl) .[name].. 1po once daily 7)  Zolpidem Tartrate 10 Mg Tabs (Zolpidem tartrate) .[name].. 1po at bedtime as needed 8)  Amphetamine-dextroamphetamine 20 Mg Tabs (Amphetamine-dextroamphetamine) .... Take one tablet two times a day - to fill June 14, 2009 9)  Ceftin 250 Mg Tabs (Cefuroxime axetil) .[name].. 1 po two times a day 10)  Promethazine Hcl 25 Mg Tabs (Promethazine hcl) .... By mouth q 6 hrs as needed nausea 11)  Meclizine Hcl 12.5 Mg Tabs (Meclizine hcl) .[name].. 1po q 6 hrs as needed dizziness 12)  Fexofenadine Hcl 180 Mg Tabs (Fexofenadine hcl) .[name].. 1po once daily as needed allergies 13)  Fluticasone Propionate 50 Mcg/act Susp (Fluticasone propionate) .... 2 spray/side once daily  Patient Instructions: 1)  Please take all new medications as prescribed 2)  Continue all previous medications as before this visit  3)  You will be  contacted about the referral(s) to: Plastic surgury - Dr Rosalene Billings Prescriptions: CEFTIN 250 MG TABS (CEFUROXIME AXETIL) 1 po two times a day  #20 x 0   Entered and Authorized by:   Corwin Levins MD   Signed by:   Corwin Levins MD on 08/12/2009   Method used:   Print then Give to Patient   RxID:   [id number] FLUTICASONE PROPIONATE 50 MCG/ACT SUSP (FLUTICASONE PROPIONATE) 2 spray/side once daily  #1 x 11   Entered and Authorized by:   Corwin Levins MD  Signed by:   Corwin Levins MD on 08/12/2009   Method used:   Print then Give to Patient   RxID:   [id number]

## 2010-02-01 NOTE — Assessment & Plan Note (Signed)
Summary: ?EAR INFECTION/CD   Vital Signs:  Patient profile:   55 year old female Height:      69 inches O2 Sat:      97 % on Room air Temp:     97.6 degrees F oral Pulse rate:   80 / minute BP sitting:   122 / 70  (left arm) Cuff size:   regular  Vitals Entered By: Zella Ball Ewing CMA (AAMA) (July 22, 2009 11:10 AM)  O2 Flow:  Room air CC: Both ears hurting/RE   CC:  Both ears hurting/RE.  History of Present Illness: here unfort with recurrent symptoms of acute onset bilat mild to mod earace, left > right, with low grade templ, general weakness and malaise; with mild headache and slight ST, but no cough and Pt denies CP, sob, doe, wheezing, orthopnea, pnd, worsening LE edema, palps, dizziness or syncope  Pt denies new neuro symptoms such as headache, facial or extremity weakness or vertigo or n/v.  Also with signfiicant popping to the ears without hearing loss and o/w as above  Problems Prior to Update: 1)  Dysfunction of Eustachian Tube  (ICD-381.81) 2)  Add  (ICD-314.00) 3)  Cellulitis, Face  (ICD-682.0) 4)  Sinusitis- Acute-nos  (ICD-461.9) 5)  Cellulitis, Leg, Left  (ICD-682.6) 6)  Wheezing  (ICD-786.07) 7)  Angioedema  (ICD-995.1) 8)  Contusion of Unspecified Site  (ICD-924.9) 9)  Vitamin D Deficiency  (ICD-268.9) 10)  Hypothyroidism  (ICD-244.9) 11)  Uri  (ICD-465.9) 12)  Glossitis  (ICD-529.0) 13)  Rash-nonvesicular  (ICD-782.1) 14)  Preventive Health Care  (ICD-V70.0) 15)  Asthmatic Bronchitis, Acute  (ICD-466.0) 16)  Sinusitis- Acute-nos  (ICD-461.9) 17)  Otitis Media, Acute, Bilateral  (ICD-382.9) 18)  Hyperlipidemia  (ICD-272.4) 19)  Hematemesis  (ICD-578.0) 20)  Abdominal Pain, Epigastric  (ICD-789.06) 21)  Sinusitis- Acute-nos  (ICD-461.9) 22)  Wound, Open, Leg, Without Complication  (ICD-891.0) 23)  Ear Pain, Bilateral  (ICD-388.70) 24)  Cellulitis, Leg, Right  (ICD-682.6) 25)  Uri  (ICD-465.9) 26)  Syncope  (ICD-780.2) 27)  Neck Mass  (ICD-784.2) 28)   Chest Pain  (ICD-786.50) 29)  Rectal Bleeding  (ICD-569.3) 30)  Hemorrhoids  (ICD-455.6) 31)  Headache, Chronic  (ICD-784.0) 32)  Gi Bleeding  (ICD-578.9) 33)  Dvt  (ICD-453.40) 34)  Anxiety Depression  (ICD-300.4) 35)  Ibs  (ICD-564.1) 36)  Colonic Polyps  (ICD-211.3) 37)  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) 38)  Vertigo  (ICD-780.4) 39)  Abdominal Pain, Right Lower Quadrant  (ICD-789.03) 40)  Abdominal Pain, Right Upper Quadrant  (ICD-789.01) 41)  Sinusitis- Acute-nos  (ICD-461.9) 42)  Adiposity, Localized  (ICD-278.1) 43)  Fatty Liver Disease, Hx of  (ICD-V12.79) 44)  Cyst, Ovarian Nec/nos  (ICD-620.2) 45)  Colonic Polyps, Hx of  (ICD-V12.72) 46)  Neop, Malignant, Female Breast Nos  (ICD-174.9) 47)  Neop, Malignant, Thymus  (ICD-164.0) 48)  Allergic Rhinitis  (ICD-477.9) 49)  Irritable Bowel Syndrome, Hx of  (ICD-V12.79) 50)  Hypertension  (ICD-401.9) 51)  Depression  (ICD-311) 52)  Anxiety  (ICD-300.00) 53)  Dvt, Hx of  (ICD-V12.51) 54)  Colectomy, Partial, With Anastomosis, Hx of  (ICD-V15.2) 55)  Dysautonomia  (ICD-742.8)  Medications Prior to Update: 1)  Effexor Xr 150 Mg Cp24 (Venlafaxine Hcl) .[name].. 1 By Mouth Once Daily 2)  Levothyroxine Sodium 50 Mcg Tabs (Levothyroxine Sodium) .[name].. 1 By Mouth Once Daily 3)  Losartan Potassium 100 Mg Tabs (Losartan Potassium) .[name].. 1 By Mouth Once Daily 4)  Celebrex 200 Mg Caps (Celecoxib) .[name].. 1 By  Mouth Two Times A Day As Needed 5)  Tylenol With Codeine #3 300-30 Mg Tabs (Acetaminophen-Codeine) .[name].. 1 By Mouth Four Times Per Day As Needed 6)  Minocycline Hcl 100 Mg Caps (Minocycline Hcl) .[name].. 1po Once Daily 7)  Zolpidem Tartrate 10 Mg Tabs (Zolpidem Tartrate) .[name].. 1po At Bedtime As Needed 8)  Amphetamine-Dextroamphetamine 20 Mg Tabs (Amphetamine-Dextroamphetamine) .... Take One Tablet Two Times A Day - To Fill June 14, 2009 9)  Doxycycline Hyclate 100 Mg Caps (Doxycycline Hyclate) .[name].. 1po Two Times A Day 10)  Promethazine Hcl 25 Mg Tabs  (Promethazine Hcl) .... By Mouth Q 6 Hrs As Needed Nausea 11)  Meclizine Hcl 12.5 Mg Tabs (Meclizine Hcl) .[name].. 1po Q 6 Hrs As Needed Dizziness 12)  Fexofenadine Hcl 180 Mg Tabs (Fexofenadine Hcl) .[name].. 1po Once Daily As Needed Allergies 13)  Fluticasone Propionate 50 Mcg/act Susp (Fluticasone Propionate) .... 2 Spray/side Once Daily  Current Medications (verified): 1)  Effexor Xr 150 Mg Cp24 (Venlafaxine Hcl) .[name].. 1 By Mouth Once Daily 2)  Levothyroxine Sodium 50 Mcg Tabs (Levothyroxine Sodium) .[name].. 1 By Mouth Once Daily 3)  Losartan Potassium 100 Mg Tabs (Losartan Potassium) .[name].. 1 By Mouth Once Daily 4)  Celebrex 200 Mg Caps (Celecoxib) .[name].. 1 By Mouth Two Times A Day As Needed 5)  Tylenol With Codeine #3 300-30 Mg Tabs (Acetaminophen-Codeine) .[name].. 1 By Mouth Four Times Per Day As Needed 6)  Minocycline Hcl 100 Mg Caps (Minocycline Hcl) .[name].. 1po Once Daily 7)  Zolpidem Tartrate 10 Mg Tabs (Zolpidem Tartrate) .[name].. 1po At Bedtime As Needed 8)  Amphetamine-Dextroamphetamine 20 Mg Tabs (Amphetamine-Dextroamphetamine) .... Take One Tablet Two Times A Day - To Fill June 14, 2009 9)  Doxycycline Hyclate 100 Mg Caps (Doxycycline Hyclate) .[name].. 1po Two Times A Day 10)  Promethazine Hcl 25 Mg Tabs (Promethazine Hcl) .... By Mouth Q 6 Hrs As Needed Nausea 11)  Meclizine Hcl 12.5 Mg Tabs (Meclizine Hcl) .[name].. 1po Q 6 Hrs As Needed Dizziness 12)  Fexofenadine Hcl 180 Mg Tabs (Fexofenadine Hcl) .[name].. 1po Once Daily As Needed Allergies 13)  Fluticasone Propionate 50 Mcg/act Susp (Fluticasone Propionate) .... 2 Spray/side Once Daily  Allergies (verified): 1)  ! Pcn 2)  ! * Pneumovax  Past History:  Past Medical History: Last updated: 05/26/2009 bileteral mastectomy/breast ca svt s/p previous ablations right hemicolectomy gi bleed hemmrroids dysautonomia thymus cancer DVT, hx of - left subclavian Anxiety Depression Hypertension ibs Allergic rhinitis Colonic polyps, hx of shy-drager syndrome -  per card Hyperlipidemia Hypothyroidism vit D deficiency ADD  Past Surgical History: Last updated: 04/04/2007 Hysterectomy Oophorectomy Cholecystectomy spigellian hernia Multiple Ablations Bilateral mastectomy/reconstructioin C-Section  Social History: Last updated: 05/26/2009 Never Smoked Alcohol use-no Married Drug use-no  Risk Factors: Smoking Status: never (10/15/2006)  Review of Systems       all otherwise negative per pt -    Physical Exam  General:  alert and overweight-appearing.   Head:  normocephalic and atraumatic.   Eyes:  vision grossly intact, pupils equal, and pupils round.   Ears:  bilat tm's mild to mod red, sinus nontender Nose:  nasal dischargemucosal pallor and mucosal edema.   Mouth:  pharyngeal erythema and fair dentition.   Neck:  supple and no masses.   Lungs:  normal respiratory effort and normal breath sounds.   Heart:  normal rate and regular rhythm.   Extremities:  no edema, no erythema    Impression & Recommendations:  Problem # 1:  OTITIS MEDIA, ACUTE, BILATERAL (  ICD-382.9)  Her updated medication list for this problem includes:    Celebrex 200 Mg Caps (Celecoxib) .[name]... 1 by mouth two times a day as needed    Minocycline Hcl 100 Mg Caps (Minocycline hcl) .[name]... 1po once daily    Doxycycline Hyclate 100 Mg Caps (Doxycycline hyclate) .[name]... 1po two times a day  Orders: Depo- Medrol 80mg  (J1040) Admin of Therapeutic Inj  intramuscular or subcutaneous (16109) Depo- Medrol 40mg  (J1030) has done well in the past with depomedrol and doxy - for treat as above, f/u any worsening signs or symptoms   Problem # 2:  DYSFUNCTION OF EUSTACHIAN TUBE (ICD-381.81) for mucinex otc as needed   Problem # 3:  HYPERTENSION (ICD-401.9)  Her updated medication list for this problem includes:    Losartan Potassium 100 Mg Tabs (Losartan potassium) .[name]... 1 by mouth once daily  BP today: 122/70 Prior BP: 140/90 (05/26/2009)  Labs Reviewed: K+: 3.8  (02/11/2008) Creat: : 0.9 (02/11/2008)   Chol: 155 (02/11/2008)   HDL: 51.3 (02/11/2008)   LDL: 73 (02/11/2008)   TG: 156 (02/11/2008) stable overall by hx and exam, ok to continue meds/tx as is   Complete Medication List: 1)  Effexor Xr 150 Mg Cp24 (Venlafaxine hcl) .[name].. 1 by mouth once daily 2)  Levothyroxine Sodium 50 Mcg Tabs (Levothyroxine sodium) .[name].. 1 by mouth once daily 3)  Losartan Potassium 100 Mg Tabs (Losartan potassium) .[name].. 1 by mouth once daily 4)  Celebrex 200 Mg Caps (Celecoxib) .[name].. 1 by mouth two times a day as needed 5)  Tylenol With Codeine #3 300-30 Mg Tabs (Acetaminophen-codeine) .[name].. 1 by mouth four times per day as needed 6)  Minocycline Hcl 100 Mg Caps (Minocycline hcl) .[name].. 1po once daily 7)  Zolpidem Tartrate 10 Mg Tabs (Zolpidem tartrate) .[name].. 1po at bedtime as needed 8)  Amphetamine-dextroamphetamine 20 Mg Tabs (Amphetamine-dextroamphetamine) .... Take one tablet two times a day - to fill June 14, 2009 9)  Doxycycline Hyclate 100 Mg Caps (Doxycycline hyclate) .[name].. 1po two times a day 10)  Promethazine Hcl 25 Mg Tabs (Promethazine hcl) .... By mouth q 6 hrs as needed nausea 11)  Meclizine Hcl 12.5 Mg Tabs (Meclizine hcl) .[name].. 1po q 6 hrs as needed dizziness 12)  Fexofenadine Hcl 180 Mg Tabs (Fexofenadine hcl) .[name].. 1po once daily as needed allergies 13)  Fluticasone Propionate 50 Mcg/act Susp (Fluticasone propionate) .... 2 spray/side once daily  Patient Instructions: 1)  you had the steroid shot today 2)  Please take all new medications as prescribed  3)  Continue all previous medications as before this visit  4)  Please schedule a follow-up appointment in Feb 2012 with CPX labs Prescriptions: LEVOTHYROXINE SODIUM 50 MCG TABS (LEVOTHYROXINE SODIUM) 1 by mouth once daily  #90 x 3   Entered and Authorized by:   Corwin Levins MD   Signed by:   Corwin Levins MD on 07/22/2009   Method used:   Print then Give to Patient   RxID:   [id number] DOXYCYCLINE  HYCLATE 100 MG CAPS (DOXYCYCLINE HYCLATE) 1po two times a day  #20 x 0   Entered and Authorized by:   Corwin Levins MD   Signed by:   Corwin Levins MD on 07/22/2009   Method used:   Print then Give to Patient   RxID:   [id number]    Medication Administration  Injection # 1:    Medication: Depo- Medrol 40mg     Diagnosis: OTITIS MEDIA, ACUTE, BILATERAL (ICD-382.9)  Route: IM    Site: LUOQ gluteus    Exp Date: 04/2012    Lot #: [id number]    Mfr: [employer]    Given by: Zella Ball Ewing CMA (AAMA) (July 22, 2009 11:58 AM)  Injection # 2:    Medication: Depo- Medrol 80mg     Diagnosis: OTITIS MEDIA, ACUTE, BILATERAL (ICD-382.9)    Route: IM    Site: LUOQ gluteus    Exp Date: 04/2012    Lot #: [id number]    Mfr: [employer]    Given by: Zella Ball Ewing CMA Duncan Dull) (July 22, 2009 11:58 AM)  Orders Added: 1)  Depo- Medrol 80mg  [J1040] 2)  Admin of Therapeutic Inj  intramuscular or subcutaneous [96372] 3)  Depo- Medrol 40mg  [J1030] 4)  Est. Patient Level IV [16109]

## 2010-02-01 NOTE — Assessment & Plan Note (Signed)
Summary: SINUS PROBLEM  STC   Vital Signs:  Patient profile:   55 year old female O2 Sat:      98 % on Room air Temp:     98.1 degrees F oral Pulse rate:   84 / minute BP sitting:   140 / 88  (left arm) Cuff size:   regular  Vitals Entered By: Zella Ball Ewing CMA Duncan Dull) (October 26, 2009 4:01 PM)  O2 Flow:  Room air CC: sinus congestion, headache/RE   Primary Care Provider:  Corwin Levins MD  CC:  sinus congestion and headache/RE.  History of Present Illness: here wtih acute onset mild to mod 2 days facial pain, pressure, fever and greenish d/c with teethvaching upper jaws as well;  also with headache, fatigue, general malaise and mild weakness, but Pt denies CP, worsening sob, doe, wheezing, orthopnea, pnd, worsening LE edema, palps, dizziness or syncope Pt denies new neuro symptoms such as headache, facial or extremity weakness  No worsening depressive or anxiety symtpoms, suicidal ideation or panic.  Overall good med compliacne and tolerance.    Problems Prior to Update: 1)  Animal Bite  (ICD-919.8) 2)  Mastectomy, Bilateral, Hx of  (ICD-V45.71) 3)  Dysfunction of Eustachian Tube  (ICD-381.81) 4)  Add  (ICD-314.00) 5)  Cellulitis, Face  (ICD-682.0) 6)  Sinusitis- Acute-nos  (ICD-461.9) 7)  Cellulitis, Leg, Left  (ICD-682.6) 8)  Wheezing  (ICD-786.07) 9)  Angioedema  (ICD-995.1) 10)  Contusion of Unspecified Site  (ICD-924.9) 11)  Vitamin D Deficiency  (ICD-268.9) 12)  Hypothyroidism  (ICD-244.9) 13)  Uri  (ICD-465.9) 14)  Glossitis  (ICD-529.0) 15)  Rash-nonvesicular  (ICD-782.1) 16)  Preventive Health Care  (ICD-V70.0) 17)  Asthmatic Bronchitis, Acute  (ICD-466.0) 18)  Sinusitis- Acute-nos  (ICD-461.9) 19)  Otitis Media, Acute, Bilateral  (ICD-382.9) 20)  Hyperlipidemia  (ICD-272.4) 21)  Hematemesis  (ICD-578.0) 22)  Abdominal Pain, Epigastric  (ICD-789.06) 23)  Sinusitis- Acute-nos  (ICD-461.9) 24)  Wound, Open, Leg, Without Complication  (ICD-891.0) 25)  Ear Pain,  Bilateral  (ICD-388.70) 26)  Cellulitis, Leg, Right  (ICD-682.6) 27)  Uri  (ICD-465.9) 28)  Syncope  (ICD-780.2) 29)  Neck Mass  (ICD-784.2) 30)  Chest Pain  (ICD-786.50) 31)  Rectal Bleeding  (ICD-569.3) 32)  Hemorrhoids  (ICD-455.6) 33)  Headache, Chronic  (ICD-784.0) 34)  Gi Bleeding  (ICD-578.9) 35)  Dvt  (ICD-453.40) 36)  Anxiety Depression  (ICD-300.4) 37)  Ibs  (ICD-564.1) 38)  Colonic Polyps  (ICD-211.3) 39)  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) 40)  Vertigo  (ICD-780.4) 41)  Abdominal Pain, Right Lower Quadrant  (ICD-789.03) 42)  Abdominal Pain, Right Upper Quadrant  (ICD-789.01) 43)  Sinusitis- Acute-nos  (ICD-461.9) 44)  Adiposity, Localized  (ICD-278.1) 45)  Fatty Liver Disease, Hx of  (ICD-V12.79) 46)  Cyst, Ovarian Nec/nos  (ICD-620.2) 47)  Colonic Polyps, Hx of  (ICD-V12.72) 48)  Neop, Malignant, Female Breast Nos  (ICD-174.9) 49)  Neop, Malignant, Thymus  (ICD-164.0) 50)  Allergic Rhinitis  (ICD-477.9) 51)  Irritable Bowel Syndrome, Hx of  (ICD-V12.79) 52)  Hypertension  (ICD-401.9) 53)  Depression  (ICD-311) 54)  Anxiety  (ICD-300.00) 55)  Dvt, Hx of  (ICD-V12.51) 56)  Colectomy, Partial, With Anastomosis, Hx of  (ICD-V15.2) 57)  Dysautonomia  (ICD-742.8)  Medications Prior to Update: 1)  Effexor Xr 150 Mg Cp24 (Venlafaxine Hcl) .[name].. 1 By Mouth Once Daily 2)  Levothyroxine Sodium 50 Mcg Tabs (Levothyroxine Sodium) .[name].. 1 By Mouth Once Daily 3)  Losartan Potassium 100 Mg Tabs (  Losartan Potassium) .[name].. 1 By Mouth Once Daily 4)  Celebrex 200 Mg Caps (Celecoxib) .[name].. 1 By Mouth Two Times A Day As Needed 5)  Tylenol With Codeine #3 300-30 Mg Tabs (Acetaminophen-Codeine) .[name].. 1 By Mouth Four Times Per Day As Needed 6)  Minocycline Hcl 100 Mg Caps (Minocycline Hcl) .[name].. 1po Once Daily 7)  Zolpidem Tartrate 10 Mg Tabs (Zolpidem Tartrate) .[name].. 1po At Bedtime As Needed 8)  Amphetamine-Dextroamphetamine 20 Mg Tabs (Amphetamine-Dextroamphetamine) .... Take One Tablet Two  Times A Day - To Fill June 14, 2009 9)  Promethazine Hcl 25 Mg Tabs (Promethazine Hcl) .... By Mouth Q 6 Hrs As Needed Nausea 10)  Meclizine Hcl 12.5 Mg Tabs (Meclizine Hcl) .[name].. 1po Q 6 Hrs As Needed Dizziness 11)  Fexofenadine Hcl 180 Mg Tabs (Fexofenadine Hcl) .[name].. 1po Once Daily As Needed Allergies 12)  Fluticasone Propionate 50 Mcg/act Susp (Fluticasone Propionate) .... 2 Spray/side Once Daily  Current Medications (verified): 1)  Effexor Xr 150 Mg Cp24 (Venlafaxine Hcl) .[name].. 1 By Mouth Once Daily 2)  Levothyroxine Sodium 50 Mcg Tabs (Levothyroxine Sodium) .[name].. 1 By Mouth Once Daily 3)  Losartan Potassium 100 Mg Tabs (Losartan Potassium) .[name].. 1 By Mouth Once Daily 4)  Celebrex 200 Mg Caps (Celecoxib) .[name].. 1 By Mouth Two Times A Day As Needed 5)  Tylenol With Codeine #3 300-30 Mg Tabs (Acetaminophen-Codeine) .[name].. 1 By Mouth Four Times Per Day As Needed 6)  Minocycline Hcl 100 Mg Caps (Minocycline Hcl) .[name].. 1po Once Daily 7)  Zolpidem Tartrate 10 Mg Tabs (Zolpidem Tartrate) .[name].. 1po At Bedtime As Needed 8)  Amphetamine-Dextroamphetamine 20 Mg Tabs (Amphetamine-Dextroamphetamine) .... Take One Tablet Two Times A Day - To Fill June 14, 2009 9)  Promethazine Hcl 25 Mg Tabs (Promethazine Hcl) .... By Mouth Q 6 Hrs As Needed Nausea 10)  Meclizine Hcl 12.5 Mg Tabs (Meclizine Hcl) .[name].. 1po Q 6 Hrs As Needed Dizziness 11)  Fexofenadine Hcl 180 Mg Tabs (Fexofenadine Hcl) .[name].. 1po Once Daily As Needed Allergies 12)  Fluticasone Propionate 50 Mcg/act Susp (Fluticasone Propionate) .... 2 Spray/side Once Daily 13)  Ceftin 250 Mg Tabs (Cefuroxime Axetil) .[name].. 1po Two Times A Day  Allergies (verified): 1)  ! Pcn 2)  ! * Pneumovax  Past History:  Past Medical History: Last updated: 05/26/2009 bileteral mastectomy/breast ca svt s/p previous ablations right hemicolectomy gi bleed hemmrroids dysautonomia thymus cancer DVT, hx of - left subclavian Anxiety Depression Hypertension ibs Allergic  rhinitis Colonic polyps, hx of shy-drager syndrome - per card Hyperlipidemia Hypothyroidism vit D deficiency ADD  Past Surgical History: Last updated: 04/04/2007 Hysterectomy Oophorectomy Cholecystectomy spigellian hernia Multiple Ablations Bilateral mastectomy/reconstructioin C-Section  Social History: Last updated: 05/26/2009 Never Smoked Alcohol use-no Married Drug use-no  Risk Factors: Alcohol Use: 0 (10/07/2009)  Risk Factors: Smoking Status: never (10/07/2009)  Review of Systems       all otherwise negative per pt -    Physical Exam  General:  alert and overweight-appearing. , mild ill   Head:  Normocephalic and atraumatic without obvious abnormalities. No apparent alopecia or balding. Eyes:  vision grossly intact, pupils equal, and pupils round.   Ears:  bilat tm's red, sinus tender bilat Nose:  nasal dischargemucosal pallor and mucosal edema.   Mouth:  pharyngeal erythema and fair dentition.   Neck:  supple and cervical lymphadenopathy.   Lungs:  normal respiratory effort and normal breath sounds.   Heart:  normal rate and regular rhythm.   Extremities:  no edema, no  erythema  Psych:  not anxious appearing and not depressed appearing.     Impression & Recommendations:  Problem # 1:  SINUSITIS- ACUTE-NOS (ICD-461.9)  Her updated medication list for this problem includes:    Minocycline Hcl 100 Mg Caps (Minocycline hcl) .[name]... 1po once daily    Fluticasone Propionate 50 Mcg/act Susp (Fluticasone propionate) .[name]... 2 spray/side once daily    Ceftin 250 Mg Tabs (Cefuroxime axetil) .[name]... 1po two times a day treat as above, f/u any worsening signs or symptoms   Problem # 2:  HYPERTENSION (ICD-401.9)  Her updated medication list for this problem includes:    Losartan Potassium 100 Mg Tabs (Losartan potassium) .[name]... 1 by mouth once daily  BP today: 140/88 Prior BP: 130/80 (10/07/2009)  Labs Reviewed: K+: 3.8 (02/11/2008) Creat: : 0.9 (02/11/2008)    Chol: 155 (02/11/2008)   HDL: 51.3 (02/11/2008)   LDL: 73 (02/11/2008)   TG: 156 (02/11/2008) stable overall by hx and exam, ok to continue meds/tx as is   Problem # 3:  ANXIETY (ICD-300.00)  Her updated medication list for this problem includes:    Effexor Xr 150 Mg Cp24 (Venlafaxine hcl) .[name]... 1 by mouth once daily stable overall by hx and exam, ok to continue meds/tx as is   Complete Medication List: 1)  Effexor Xr 150 Mg Cp24 (Venlafaxine hcl) .[name].. 1 by mouth once daily 2)  Levothyroxine Sodium 50 Mcg Tabs (Levothyroxine sodium) .[name].. 1 by mouth once daily 3)  Losartan Potassium 100 Mg Tabs (Losartan potassium) .[name].. 1 by mouth once daily 4)  Celebrex 200 Mg Caps (Celecoxib) .[name].. 1 by mouth two times a day as needed 5)  Tylenol With Codeine #3 300-30 Mg Tabs (Acetaminophen-codeine) .[name].. 1 by mouth four times per day as needed 6)  Minocycline Hcl 100 Mg Caps (Minocycline hcl) .[name].. 1po once daily 7)  Zolpidem Tartrate 10 Mg Tabs (Zolpidem tartrate) .[name].. 1po at bedtime as needed 8)  Amphetamine-dextroamphetamine 20 Mg Tabs (Amphetamine-dextroamphetamine) .... Take one tablet two times a day - to fill June 14, 2009 9)  Promethazine Hcl 25 Mg Tabs (Promethazine hcl) .... By mouth q 6 hrs as needed nausea 10)  Meclizine Hcl 12.5 Mg Tabs (Meclizine hcl) .[name].. 1po q 6 hrs as needed dizziness 11)  Fexofenadine Hcl 180 Mg Tabs (Fexofenadine hcl) .[name].. 1po once daily as needed allergies 12)  Fluticasone Propionate 50 Mcg/act Susp (Fluticasone propionate) .... 2 spray/side once daily 13)  Ceftin 250 Mg Tabs (Cefuroxime axetil) .[name].. 1po two times a day  Patient Instructions: 1)  Please take all new medications as prescribed 2)  Continue all previous medications as before this visit 3)  Please schedule a follow-up appointment in 4 months with CPX labs Prescriptions: CEFTIN 250 MG TABS (CEFUROXIME AXETIL) 1po two times a day  #20 x 0   Entered and Authorized by:   Corwin Levins MD   Signed by:   Corwin Levins MD on 10/26/2009   Method used:   Electronically to        [facility]  [phone]* (retail)       [address] [place], [place]  [address]       Ph: [phone] or [phone]       Fax: [phone]   RxID:   [id number]    Orders Added: 1)  Est. Patient Level IV [36644]

## 2010-02-01 NOTE — Miscellaneous (Signed)
Summary: [facility]   Imported By: Lester Manchester 03/26/2009 09:57:40  _____________________________________________________________________  External Attachment:    Type:   Image     Comment:   External Document

## 2010-02-03 NOTE — Assessment & Plan Note (Signed)
Summary: ear infection/nws   Vital Signs:  Patient profile:   55 year old female O2 Sat:      98 % on Room air Temp:     98.4 degrees F oral Pulse rate:   92 / minute BP sitting:   140 / 90  (left arm) Cuff size:   large  Vitals Entered By: Zella Ball Ewing CMA (AAMA) (January 07, 2010 10:00 AM)  O2 Flow:  Room air CC: Ear Pain/RE   Primary Care Provider:  Corwin Levins MD  CC:  Ear Pain/RE.  History of Present Illness: here with 3 days acute onset facial pain, pressure, greenish d/c with midl ST, but no cough and Pt denies CP, worsening sob, doe, wheezing, orthopnea, pnd, worsening LE edema, palps, dizziness or syncope  Pt denies new neuro symptoms such as headache, facial or extremity weakness  Pt denies polydipsia, polyuria  Overall good compliance with meds, trying to follow low chol diet, wt stable, little excercise however  Denies worsening depressive symptoms, suicidal ideation, or panic and anxiety overall stable.    Problems Prior to Update: 1)  Sinusitis- Acute-nos  (ICD-461.9) 2)  Animal Bite  (ICD-919.8) 3)  Mastectomy, Bilateral, Hx of  (ICD-V45.71) 4)  Dysfunction of Eustachian Tube  (ICD-381.81) 5)  Add  (ICD-314.00) 6)  Cellulitis, Face  (ICD-682.0) 7)  Sinusitis- Acute-nos  (ICD-461.9) 8)  Cellulitis, Leg, Left  (ICD-682.6) 9)  Wheezing  (ICD-786.07) 10)  Angioedema  (ICD-995.1) 11)  Contusion of Unspecified Site  (ICD-924.9) 12)  Vitamin D Deficiency  (ICD-268.9) 13)  Hypothyroidism  (ICD-244.9) 14)  Uri  (ICD-465.9) 15)  Glossitis  (ICD-529.0) 16)  Rash-nonvesicular  (ICD-782.1) 17)  Preventive Health Care  (ICD-V70.0) 18)  Asthmatic Bronchitis, Acute  (ICD-466.0) 19)  Sinusitis- Acute-nos  (ICD-461.9) 20)  Otitis Media, Acute, Bilateral  (ICD-382.9) 21)  Hyperlipidemia  (ICD-272.4) 22)  Hematemesis  (ICD-578.0) 23)  Abdominal Pain, Epigastric  (ICD-789.06) 24)  Sinusitis- Acute-nos  (ICD-461.9) 25)  Wound, Open, Leg, Without Complication   (ICD-891.0) 26)  Ear Pain, Bilateral  (ICD-388.70) 27)  Cellulitis, Leg, Right  (ICD-682.6) 28)  Uri  (ICD-465.9) 29)  Syncope  (ICD-780.2) 30)  Neck Mass  (ICD-784.2) 31)  Chest Pain  (ICD-786.50) 32)  Rectal Bleeding  (ICD-569.3) 33)  Hemorrhoids  (ICD-455.6) 34)  Headache, Chronic  (ICD-784.0) 35)  Gi Bleeding  (ICD-578.9) 36)  Dvt  (ICD-453.40) 37)  Anxiety Depression  (ICD-300.4) 38)  Ibs  (ICD-564.1) 39)  Colonic Polyps  (ICD-211.3) 40)  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) 41)  Vertigo  (ICD-780.4) 42)  Abdominal Pain, Right Lower Quadrant  (ICD-789.03) 43)  Abdominal Pain, Right Upper Quadrant  (ICD-789.01) 44)  Sinusitis- Acute-nos  (ICD-461.9) 45)  Adiposity, Localized  (ICD-278.1) 46)  Fatty Liver Disease, Hx of  (ICD-V12.79) 47)  Cyst, Ovarian Nec/nos  (ICD-620.2) 48)  Colonic Polyps, Hx of  (ICD-V12.72) 49)  Neop, Malignant, Female Breast Nos  (ICD-174.9) 50)  Neop, Malignant, Thymus  (ICD-164.0) 51)  Allergic Rhinitis  (ICD-477.9) 52)  Irritable Bowel Syndrome, Hx of  (ICD-V12.79) 53)  Hypertension  (ICD-401.9) 54)  Depression  (ICD-311) 55)  Anxiety  (ICD-300.00) 56)  Dvt, Hx of  (ICD-V12.51) 57)  Colectomy, Partial, With Anastomosis, Hx of  (ICD-V15.2) 58)  Dysautonomia  (ICD-742.8)  Medications Prior to Update: 1)  Effexor Xr 150 Mg Cp24 (Venlafaxine Hcl) .[name].. 1 By Mouth Once Daily 2)  Levothyroxine Sodium 50 Mcg Tabs (Levothyroxine Sodium) .[name].. 1 By Mouth Once Daily 3)  Losartan Potassium 100 Mg Tabs (Losartan Potassium) .[name].. 1 By Mouth Once Daily 4)  Celebrex 200 Mg Caps (Celecoxib) .[name].. 1 By Mouth Two Times A Day As Needed 5)  Tylenol With Codeine #3 300-30 Mg Tabs (Acetaminophen-Codeine) .[name].. 1 By Mouth Four Times Per Day As Needed 6)  Minocycline Hcl 100 Mg Caps (Minocycline Hcl) .[name].. 1po Once Daily 7)  Zolpidem Tartrate 10 Mg Tabs (Zolpidem Tartrate) .[name].. 1po At Bedtime As Needed 8)  Concerta 36 Mg Cr-Tabs (Methylphenidate Hcl) .[name].. 1po Once Daily  -  Generic 9)  Promethazine Hcl 25 Mg Tabs (Promethazine Hcl) .... By Mouth Q 6 Hrs As Needed Nausea 10)  Meclizine Hcl 12.5 Mg Tabs (Meclizine Hcl) .[name].. 1po Q 6 Hrs As Needed Dizziness 11)  Fexofenadine Hcl 180 Mg Tabs (Fexofenadine Hcl) .[name].. 1po Once Daily As Needed Allergies 12)  Fluticasone Propionate 50 Mcg/act Susp (Fluticasone Propionate) .... 2 Spray/side Once Daily 13)  Ceftin 250 Mg Tabs (Cefuroxime Axetil) .[name].. 1po Two Times A Day  Current Medications (verified): 1)  Effexor Xr 150 Mg Cp24 (Venlafaxine Hcl) .[name].. 1 By Mouth Once Daily 2)  Levothyroxine Sodium 50 Mcg Tabs (Levothyroxine Sodium) .[name].. 1 By Mouth Once Daily 3)  Losartan Potassium 100 Mg Tabs (Losartan Potassium) .[name].. 1 By Mouth Once Daily 4)  Celebrex 200 Mg Caps (Celecoxib) .[name].. 1 By Mouth Two Times A Day As Needed 5)  Tylenol With Codeine #3 300-30 Mg Tabs (Acetaminophen-Codeine) .[name].. 1 By Mouth Four Times Per Day As Needed 6)  Minocycline Hcl 100 Mg Caps (Minocycline Hcl) .[name].. 1po Once Daily 7)  Zolpidem Tartrate 10 Mg Tabs (Zolpidem Tartrate) .[name].. 1po At Bedtime As Needed 8)  Concerta 36 Mg Cr-Tabs (Methylphenidate Hcl) .[name].. 1po Once Daily  - Generic 9)  Promethazine Hcl 25 Mg Tabs (Promethazine Hcl) .... By Mouth Q 6 Hrs As Needed Nausea 10)  Meclizine Hcl 12.5 Mg Tabs (Meclizine Hcl) .[name].. 1po Q 6 Hrs As Needed Dizziness 11)  Fexofenadine Hcl 180 Mg Tabs (Fexofenadine Hcl) .[name].. 1po Once Daily As Needed Allergies 12)  Fluticasone Propionate 50 Mcg/act Susp (Fluticasone Propionate) .... 2 Spray/side Once Daily 13)  Ceftin 250 Mg Tabs (Cefuroxime Axetil) .[name].. 1po Two Times A Day 14)  Ciprofloxacin Hcl 500 Mg Tabs (Ciprofloxacin Hcl) .[name].. 1po Two Times A Day As Needed Diarrhea  Allergies (verified): 1)  ! Pcn 2)  ! * Pneumovax  Past History:  Past Medical History: Last updated: 05/26/2009 bileteral mastectomy/breast ca svt s/p previous ablations right hemicolectomy gi bleed hemmrroids dysautonomia thymus  cancer DVT, hx of - left subclavian Anxiety Depression Hypertension ibs Allergic rhinitis Colonic polyps, hx of shy-drager syndrome - per card Hyperlipidemia Hypothyroidism vit D deficiency ADD  Past Surgical History: Last updated: 04/04/2007 Hysterectomy Oophorectomy Cholecystectomy spigellian hernia Multiple Ablations Bilateral mastectomy/reconstructioin C-Section  Social History: Last updated: 05/26/2009 Never Smoked Alcohol use-no Married Drug use-no  Risk Factors: Alcohol Use: 0 (10/07/2009)  Risk Factors: Smoking Status: never (10/07/2009)  Review of Systems       all otherwise negative per pt -    Physical Exam  General:  alert and overweight-appearing. , mild ill   Head:  Normocephalic and atraumatic without obvious abnormalities. Eyes:  vision grossly intact, pupils equal, and pupils round.   Ears:  bilat tm's red, sinus tender bilat Nose:  nasal dischargemucosal pallor and mucosal edema.   Mouth:  pharyngeal erythema and fair dentition.   Neck:  supple and cervical lymphadenopathy.   Lungs:  normal respiratory effort  and normal breath sounds.   Heart:  normal rate and regular rhythm.   Extremities:  no edema, no erythema    Impression & Recommendations:  Problem # 1:  SINUSITIS- ACUTE-NOS (ICD-461.9)  Her updated medication list for this problem includes:    Minocycline Hcl 100 Mg Caps (Minocycline hcl) .[name]... 1po once daily    Fluticasone Propionate 50 Mcg/act Susp (Fluticasone propionate) .[name]... 2 spray/side once daily    Ceftin 250 Mg Tabs (Cefuroxime axetil) .[name]... 1po two times a day    Ciprofloxacin Hcl 500 Mg Tabs (Ciprofloxacin hcl) .[name]... 1po two times a day as needed diarrhea treat as above, f/u any worsening signs or symptoms   Problem # 2:  HYPERTENSION (ICD-401.9)  Her updated medication list for this problem includes:    Losartan Potassium 100 Mg Tabs (Losartan potassium) .[name]... 1 by mouth once daily  BP today: 140/90 Prior  BP: 140/88 (10/26/2009)  Labs Reviewed: K+: 3.8 (02/11/2008) Creat: : 0.9 (02/11/2008)   Chol: 155 (02/11/2008)   HDL: 51.3 (02/11/2008)   LDL: 73 (02/11/2008)   TG: 156 (02/11/2008) stable overall by hx and exam, ok to continue meds/tx as is   Problem # 3:  ANXIETY DEPRESSION (ICD-300.4) stable overall by hx and exam, ok to continue meds/tx as is - declines meds today  Complete Medication List: 1)  Effexor Xr 150 Mg Cp24 (Venlafaxine hcl) .[name].. 1 by mouth once daily 2)  Levothyroxine Sodium 50 Mcg Tabs (Levothyroxine sodium) .[name].. 1 by mouth once daily 3)  Losartan Potassium 100 Mg Tabs (Losartan potassium) .[name].. 1 by mouth once daily 4)  Celebrex 200 Mg Caps (Celecoxib) .[name].. 1 by mouth two times a day as needed 5)  Tylenol With Codeine #3 300-30 Mg Tabs (Acetaminophen-codeine) .[name].. 1 by mouth four times per day as needed 6)  Minocycline Hcl 100 Mg Caps (Minocycline hcl) .[name].. 1po once daily 7)  Zolpidem Tartrate 10 Mg Tabs (Zolpidem tartrate) .[name].. 1po at bedtime as needed 8)  Concerta 36 Mg Cr-tabs (Methylphenidate hcl) .[name].. 1po once daily  - generic 9)  Promethazine Hcl 25 Mg Tabs (Promethazine hcl) .... By mouth q 6 hrs as needed nausea 10)  Meclizine Hcl 12.5 Mg Tabs (Meclizine hcl) .[name].. 1po q 6 hrs as needed dizziness 11)  Fexofenadine Hcl 180 Mg Tabs (Fexofenadine hcl) .[name].. 1po once daily as needed allergies 12)  Fluticasone Propionate 50 Mcg/act Susp (Fluticasone propionate) .... 2 spray/side once daily 13)  Ceftin 250 Mg Tabs (Cefuroxime axetil) .[name].. 1po two times a day 14)  Ciprofloxacin Hcl 500 Mg Tabs (Ciprofloxacin hcl) .[name].. 1po two times a day as needed diarrhea  Patient Instructions: 1)  Please take all new medications as prescribed  2)  Continue all previous medications as before this visit 3)  Please schedule a follow-up appointment in 4 months for CPX with labs Prescriptions: TYLENOL WITH CODEINE #3 300-30 MG TABS (ACETAMINOPHEN-CODEINE) 1 by mouth four times per day as  needed  #120 x 2   Entered and Authorized by:   Corwin Levins MD   Signed by:   Corwin Levins MD on 01/07/2010   Method used:   Print then Give to Patient   RxID:   [id number] ZOLPIDEM TARTRATE 10 MG TABS (ZOLPIDEM TARTRATE) 1po at bedtime as needed  #30 x 2   Entered and Authorized by:   Corwin Levins MD   Signed by:   Corwin Levins MD on 01/07/2010   Method used:   Print then  Give to Patient   RxID:   [id number] PROMETHAZINE HCL 25 MG TABS (PROMETHAZINE HCL) by mouth q 6 hrs as needed nausea  #50 x 1   Entered and Authorized by:   Corwin Levins MD   Signed by:   Corwin Levins MD on 01/07/2010   Method used:   Print then Give to Patient   RxID:   [id number] CIPROFLOXACIN HCL 500 MG TABS (CIPROFLOXACIN HCL) 1po two times a day as needed diarrhea  #20 x 0   Entered and Authorized by:   Corwin Levins MD   Signed by:   Corwin Levins MD on 01/07/2010   Method used:   Print then Give to Patient   RxID:   [id number] CEFTIN 250 MG TABS (CEFUROXIME AXETIL) 1po two times a day  #20 x 0   Entered and Authorized by:   Corwin Levins MD   Signed by:   Corwin Levins MD on 01/07/2010   Method used:   Print then Give to Patient   RxID:   [id number]    Orders Added: 1)  Est. Patient Level IV [42595]

## 2010-02-07 ENCOUNTER — Telehealth: Payer: Self-pay | Admitting: Internal Medicine

## 2010-02-16 ENCOUNTER — Telehealth: Payer: Self-pay | Admitting: Internal Medicine

## 2010-02-17 ENCOUNTER — Ambulatory Visit (INDEPENDENT_AMBULATORY_CARE_PROVIDER_SITE_OTHER): Payer: BC Managed Care – PPO | Admitting: Internal Medicine

## 2010-02-17 ENCOUNTER — Encounter (INDEPENDENT_AMBULATORY_CARE_PROVIDER_SITE_OTHER): Payer: Self-pay | Admitting: *Deleted

## 2010-02-17 ENCOUNTER — Encounter: Payer: Self-pay | Admitting: Internal Medicine

## 2010-02-17 ENCOUNTER — Other Ambulatory Visit: Payer: BC Managed Care – PPO

## 2010-02-17 DIAGNOSIS — B9789 Other viral agents as the cause of diseases classified elsewhere: Secondary | ICD-10-CM

## 2010-02-17 DIAGNOSIS — E039 Hypothyroidism, unspecified: Secondary | ICD-10-CM

## 2010-02-17 DIAGNOSIS — H6690 Otitis media, unspecified, unspecified ear: Secondary | ICD-10-CM

## 2010-02-17 DIAGNOSIS — F988 Other specified behavioral and emotional disorders with onset usually occurring in childhood and adolescence: Secondary | ICD-10-CM

## 2010-02-17 NOTE — Progress Notes (Signed)
Summary: Rx refill req  Phone Note Refill Request Message from:  Fax from Pharmacy on February 07, 2010 10:02 AM  Refills Requested: Medication #1:  TYLENOL WITH CODEINE #3 300-30 MG TABS 1 by mouth four times per day as needed   Dosage confirmed as above?Dosage Confirmed   Supply Requested: 3 months   Notes: fax [phone]  Method Requested: Fax to Local Pharmacy Initial call taken by: Margaret Pyle, CMA,  February 07, 2010 10:02 AM     too soon, as she was given 3 mo supply in jan 2012 Corwin Levins MD  February 07, 2010 11:40 AM   Pharmacy notifed via written fax of above Margaret Pyle, CMA  February 08, 2010 8:18 AM

## 2010-02-22 ENCOUNTER — Encounter: Payer: Self-pay | Admitting: Internal Medicine

## 2010-02-22 DIAGNOSIS — A90 Dengue fever [classical dengue]: Secondary | ICD-10-CM

## 2010-02-22 HISTORY — DX: Dengue fever (classical dengue): A90

## 2010-02-23 NOTE — Progress Notes (Signed)
Summary: pt want antibiotics refilled  Phone Note Call from Patient Call back at [phone]   Caller: Patient Call For: Corwin Levins MD Reason for Call: Refill Medication Summary of Call: Patient called stating that she has another ear infection and she has about ten ear infection per year and the doctor is aware. Patient requesting that the doctor refill her previous antibiotic used to treat her  last ear infection as he has no openings for her to be seen today. Patient stated she is in too much pain to schedule an appt for tomorrow. Initial call taken by: Daphane Shepherd,  February 16, 2010 10:28 AM  Follow-up for Phone Call        per JWJ, pt needs to schedule an OV for ATB Follow-up by: Brenton Grills CMA Duncan Dull),  February 16, 2010 10:36 AM  Additional Follow-up for Phone Call Additional follow up Details #1::        patient scheduled for 2:30pm tomorrow Additional Follow-up by: Daphane Shepherd,  February 16, 2010 1:04 PM

## 2010-02-23 NOTE — Assessment & Plan Note (Signed)
Summary: ear infection,lb   Vital Signs:  Patient profile:   55 year old female O2 Sat:      98 % on Room air Temp:     98.6 degrees F oral Pulse rate:   116 / minute BP sitting:   122 / 78  (left arm) Cuff size:   regular  Vitals Entered By: Zella Ball Ewing CMA Duncan Dull) (February 17, 2010 2:58 PM)  O2 Flow:  Room air CC: Both ears hurt, glands swollen, sinus pressure and headache/RE   Primary Care Provider:  Corwin Levins MD  CC:  Both ears hurt, glands swollen, and sinus pressure and headache/RE.  History of Present Illness: here with acute onset 3 days fever, mild illness with HA, bilat earpain, sinus pain/pressure, greenish d/c , ST and nonprod cough.  Pt denies CP, worsening sob, doe, wheezing, orthopnea, pnd, worsening LE edema, palps, dizziness or syncope  Pt denies new neuro symptoms such as headache, facial or extremity weakness  Pt denies polydipsia, polyuria.  Just returned from [place] in the [place], and believes she contracted and has improved from acute dengue fever assoc with fever, severe myalgias and rash to the hands, all now improved.  Denies worsening depressive symptoms, suicidal ideation, or panic, though has ongoing anxiety.  ADD symptoms stable , needs refill meds.  Denies hyper or hypo thyroid symtpoms such as voice/skin/wt change.    Problems Prior to Update: 1)  Viral Infection  (ICD-079.99) 2)  Otitis Media, Left  (ICD-382.9) 3)  Sinusitis- Acute-nos  (ICD-461.9) 4)  Animal Bite  (ICD-919.8) 5)  Mastectomy, Bilateral, Hx of  (ICD-V45.71) 6)  Dysfunction of Eustachian Tube  (ICD-381.81) 7)  Add  (ICD-314.00) 8)  Cellulitis, Face  (ICD-682.0) 9)  Sinusitis- Acute-nos  (ICD-461.9) 10)  Cellulitis, Leg, Left  (ICD-682.6) 11)  Wheezing  (ICD-786.07) 12)  Angioedema  (ICD-995.1) 13)  Contusion of Unspecified Site  (ICD-924.9) 14)  Vitamin D Deficiency  (ICD-268.9) 15)  Hypothyroidism  (ICD-244.9) 16)  Uri  (ICD-465.9) 17)  Glossitis   (ICD-529.0) 18)  Rash-nonvesicular  (ICD-782.1) 19)  Preventive Health Care  (ICD-V70.0) 20)  Asthmatic Bronchitis, Acute  (ICD-466.0) 21)  Sinusitis- Acute-nos  (ICD-461.9) 22)  Otitis Media, Acute, Bilateral  (ICD-382.9) 23)  Hyperlipidemia  (ICD-272.4) 24)  Hematemesis  (ICD-578.0) 25)  Abdominal Pain, Epigastric  (ICD-789.06) 26)  Sinusitis- Acute-nos  (ICD-461.9) 27)  Wound, Open, Leg, Without Complication  (ICD-891.0) 28)  Ear Pain, Bilateral  (ICD-388.70) 29)  Cellulitis, Leg, Right  (ICD-682.6) 30)  Uri  (ICD-465.9) 31)  Syncope  (ICD-780.2) 32)  Neck Mass  (ICD-784.2) 33)  Chest Pain  (ICD-786.50) 34)  Rectal Bleeding  (ICD-569.3) 35)  Hemorrhoids  (ICD-455.6) 36)  Headache, Chronic  (ICD-784.0) 37)  Gi Bleeding  (ICD-578.9) 38)  Dvt  (ICD-453.40) 39)  Anxiety Depression  (ICD-300.4) 40)  Ibs  (ICD-564.1) 41)  Colonic Polyps  (ICD-211.3) 42)  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) 43)  Vertigo  (ICD-780.4) 44)  Abdominal Pain, Right Lower Quadrant  (ICD-789.03) 45)  Abdominal Pain, Right Upper Quadrant  (ICD-789.01) 46)  Sinusitis- Acute-nos  (ICD-461.9) 47)  Adiposity, Localized  (ICD-278.1) 48)  Fatty Liver Disease, Hx of  (ICD-V12.79) 49)  Cyst, Ovarian Nec/nos  (ICD-620.2) 50)  Colonic Polyps, Hx of  (ICD-V12.72) 51)  Neop, Malignant, Female Breast Nos  (ICD-174.9) 52)  Neop, Malignant, Thymus  (ICD-164.0) 53)  Allergic Rhinitis  (ICD-477.9) 54)  Irritable Bowel Syndrome, Hx of  (ICD-V12.79) 55)  Hypertension  (ICD-401.9)  56)  Depression  (ICD-311) 57)  Anxiety  (ICD-300.00) 58)  Dvt, Hx of  (ICD-V12.51) 59)  Colectomy, Partial, With Anastomosis, Hx of  (ICD-V15.2) 60)  Dysautonomia  (ICD-742.8)  Medications Prior to Update: 1)  Effexor Xr 150 Mg Cp24 (Venlafaxine Hcl) .[name].. 1 By Mouth Once Daily 2)  Levothyroxine Sodium 50 Mcg Tabs (Levothyroxine Sodium) .[name].. 1 By Mouth Once Daily 3)  Losartan Potassium 100 Mg Tabs (Losartan Potassium) .[name].. 1 By Mouth Once  Daily 4)  Celebrex 200 Mg Caps (Celecoxib) .[name].. 1 By Mouth Two Times A Day As Needed 5)  Tylenol With Codeine #3 300-30 Mg Tabs (Acetaminophen-Codeine) .[name].. 1 By Mouth Four Times Per Day As Needed 6)  Minocycline Hcl 100 Mg Caps (Minocycline Hcl) .[name].. 1po Once Daily 7)  Zolpidem Tartrate 10 Mg Tabs (Zolpidem Tartrate) .[name].. 1po At Bedtime As Needed 8)  Concerta 36 Mg Cr-Tabs (Methylphenidate Hcl) .[name].. 1po Once Daily  - Generic 9)  Promethazine Hcl 25 Mg Tabs (Promethazine Hcl) .... By Mouth Q 6 Hrs As Needed Nausea 10)  Meclizine Hcl 12.5 Mg Tabs (Meclizine Hcl) .[name].. 1po Q 6 Hrs As Needed Dizziness 11)  Fexofenadine Hcl 180 Mg Tabs (Fexofenadine Hcl) .[name].. 1po Once Daily As Needed Allergies 12)  Fluticasone Propionate 50 Mcg/act Susp (Fluticasone Propionate) .... 2 Spray/side Once Daily 13)  Ceftin 250 Mg Tabs (Cefuroxime Axetil) .[name].. 1po Two Times A Day 14)  Ciprofloxacin Hcl 500 Mg Tabs (Ciprofloxacin Hcl) .[name].. 1po Two Times A Day As Needed Diarrhea  Current Medications (verified): 1)  Effexor Xr 150 Mg Cp24 (Venlafaxine Hcl) .[name].. 1 By Mouth Once Daily 2)  Levothyroxine Sodium 50 Mcg Tabs (Levothyroxine Sodium) .[name].. 1 By Mouth Once Daily 3)  Losartan Potassium 100 Mg Tabs (Losartan Potassium) .[name].. 1 By Mouth Once Daily 4)  Celebrex 200 Mg Caps (Celecoxib) .[name].. 1 By Mouth Two Times A Day As Needed 5)  Tylenol With Codeine #3 300-30 Mg Tabs (Acetaminophen-Codeine) .[name].. 1 By Mouth Four Times Per Day As Needed 6)  Minocycline Hcl 100 Mg Caps (Minocycline Hcl) .[name].. 1po Once Daily 7)  Zolpidem Tartrate 10 Mg Tabs (Zolpidem Tartrate) .[name].. 1po At Bedtime As Needed 8)  Concerta 36 Mg Cr-Tabs (Methylphenidate Hcl) .[name].. 1po Once Daily  - Generic - To Fill Apr 19, 2010 9)  Promethazine Hcl 25 Mg Tabs (Promethazine Hcl) .... By Mouth Q 6 Hrs As Needed Nausea 10)  Meclizine Hcl 12.5 Mg Tabs (Meclizine Hcl) .[name].. 1po Q 6 Hrs As Needed Dizziness 11)  Fexofenadine Hcl 180 Mg Tabs (Fexofenadine Hcl) .[name].. 1po  Once Daily As Needed Allergies 12)  Fluticasone Propionate 50 Mcg/act Susp (Fluticasone Propionate) .... 2 Spray/side Once Daily 13)  Ceftin 250 Mg Tabs (Cefuroxime Axetil) .[name].. 1 By Mouth Two Times A Day  Allergies (verified): 1)  ! Pcn 2)  ! * Pneumovax  Past History:  Past Medical History: Last updated: 05/26/2009 bileteral mastectomy/breast ca svt s/p previous ablations right hemicolectomy gi bleed hemmrroids dysautonomia thymus cancer DVT, hx of - left subclavian Anxiety Depression Hypertension ibs Allergic rhinitis Colonic polyps, hx of shy-drager syndrome - per card Hyperlipidemia Hypothyroidism vit D deficiency ADD  Past Surgical History: Last updated: 04/04/2007 Hysterectomy Oophorectomy Cholecystectomy spigellian hernia Multiple Ablations Bilateral mastectomy/reconstructioin C-Section  Social History: Last updated: 05/26/2009 Never Smoked Alcohol use-no Married Drug use-no  Risk Factors: Alcohol Use: 0 (10/07/2009)  Risk Factors: Smoking Status: never (10/07/2009)  Review of Systems       all otherwise negative per pt -  Physical Exam  General:  alert and overweight-appearing. , mild ill   Head:  Normocephalic and atraumatic without obvious abnormalities. Eyes:  vision grossly intact, pupils equal, and pupils round.   Ears:  bilat tm's red, sinus tender bilat Nose:  nasal dischargemucosal pallor and mucosal edema.   Mouth:  pharyngeal erythema and fair dentition.   Neck:  supple and cervical lymphadenopathy.   Lungs:  normal respiratory effort and normal breath sounds.   Heart:  normal rate and regular rhythm.   Extremities:  no edema, no erythema  Skin:  color normal and no rashes.     Impression & Recommendations:  Problem # 1:  OTITIS MEDIA, LEFT (ICD-382.9)  The following medications were removed from the medication list:    Ceftin 250 Mg Tabs (Cefuroxime axetil) .[name]... 1po two times a day Her updated medication list for  this problem includes:    Celebrex 200 Mg Caps (Celecoxib) .[name]... 1 by mouth two times a day as needed    Minocycline Hcl 100 Mg Caps (Minocycline hcl) .[name]... 1po once daily    Ceftin 250 Mg Tabs (Cefuroxime axetil) .[name]... 1 by mouth two times a day treat as above, f/u any worsening signs or symptoms   Problem # 2:  VIRAL INFECTION (ICD-079.99)  Her updated medication list for this problem includes:    Celebrex 200 Mg Caps (Celecoxib) .[name]... 1 by mouth two times a day as needed  Orders: T-Immunoassay, Infectious Agent [phone])  by pt hx  - symtpoms c/w dengue - for serologic testing today  Problem # 3:  ADD (ICD-314.00) stable overall by hx and exam, ok to continue meds/tx as is - for med refills today  Problem # 4:  HYPOTHYROIDISM (ICD-244.9)  Her updated medication list for this problem includes:    Levothyroxine Sodium 50 Mcg Tabs (Levothyroxine sodium) .[name]... 1 by mouth once daily  Labs Reviewed: TSH: 11.54 (02/11/2008)    Chol: 155 (02/11/2008)   HDL: 51.3 (02/11/2008)   LDL: 73 (02/11/2008)   TG: 156 (02/11/2008) stable overall by hx and exam, ok to continue meds/tx as is , declines lab today- to re-check next visit   Complete Medication List: 1)  Effexor Xr 150 Mg Cp24 (Venlafaxine hcl) .[name].. 1 by mouth once daily 2)  Levothyroxine Sodium 50 Mcg Tabs (Levothyroxine sodium) .[name].. 1 by mouth once daily 3)  Losartan Potassium 100 Mg Tabs (Losartan potassium) .[name].. 1 by mouth once daily 4)  Celebrex 200 Mg Caps (Celecoxib) .[name].. 1 by mouth two times a day as needed 5)  Tylenol With Codeine #3 300-30 Mg Tabs (Acetaminophen-codeine) .[name].. 1 by mouth four times per day as needed 6)  Minocycline Hcl 100 Mg Caps (Minocycline hcl) .[name].. 1po once daily 7)  Zolpidem Tartrate 10 Mg Tabs (Zolpidem tartrate) .[name].. 1po at bedtime as needed 8)  Concerta 36 Mg Cr-tabs (Methylphenidate hcl) .[name].. 1po once daily  - generic - to fill Apr 19, 2010 9)  Promethazine Hcl 25 Mg Tabs (Promethazine hcl) .... By  mouth q 6 hrs as needed nausea 10)  Meclizine Hcl 12.5 Mg Tabs (Meclizine hcl) .[name].. 1po q 6 hrs as needed dizziness 11)  Fexofenadine Hcl 180 Mg Tabs (Fexofenadine hcl) .[name].. 1po once daily as needed allergies 12)  Fluticasone Propionate 50 Mcg/act Susp (Fluticasone propionate) .... 2 spray/side once daily 13)  Ceftin 250 Mg Tabs (Cefuroxime axetil) .[name].. 1 by mouth two times a day  Patient Instructions: 1)  Please take all new medications as prescribed 2)  Continue all previous medications as before this visit  3)  Please go to the Lab in the basement for your blood test 4)  Please schedule a follow-up appointment as needed. Prescriptions: CONCERTA 36 MG CR-TABS (METHYLPHENIDATE HCL) 1po once daily  - generic - to fill Apr 19, 2010  #30 x 0   Entered and Authorized by:   Corwin Levins MD   Signed by:   Corwin Levins MD on 02/17/2010   Method used:   Print then Give to Patient   RxID:   [id number] CONCERTA 36 MG CR-TABS (METHYLPHENIDATE HCL) 1po once daily  - generic - to fill Mar 20, 2010  #30 x 0   Entered and Authorized by:   Corwin Levins MD   Signed by:   Corwin Levins MD on 02/17/2010   Method used:   Print then Give to Patient   RxID:   [id number] CONCERTA 36 MG CR-TABS (METHYLPHENIDATE HCL) 1po once daily  - generic - to fill Feb 17, 2010  #30 x 0   Entered and Authorized by:   Corwin Levins MD   Signed by:   Corwin Levins MD on 02/17/2010   Method used:   Print then Give to Patient   RxID:   [id number] LEVOTHYROXINE SODIUM 50 MCG TABS (LEVOTHYROXINE SODIUM) 1 by mouth once daily  #90 x 3   Entered and Authorized by:   Corwin Levins MD   Signed by:   Corwin Levins MD on 02/17/2010   Method used:   Electronically to        [facility]  [phone]* (retail)       [address] [place], [place]  [address]       Ph: [phone] or [phone]       Fax: [phone]   RxID:   [id number] CEFTIN 250 MG TABS (CEFUROXIME AXETIL) 1 by mouth two times a day   #20 x 0   Entered and Authorized by:   Corwin Levins MD   Signed by:   Corwin Levins MD on 02/17/2010   Method used:   Electronically to        [facility]  [phone]* (retail)       [address] [place], [place]  [address]       Ph: [phone] or [phone]       Fax: [phone]   RxID:   [id number]    Orders Added: 1)  T-Immunoassay, Infectious Agent [[id number]] 2)  Est. Patient Level IV [96789]

## 2010-03-01 NOTE — Miscellaneous (Signed)
   Clinical Lists Changes  Problems: Added new problem of DENGUE (ICD-061)

## 2010-03-28 ENCOUNTER — Other Ambulatory Visit: Payer: Self-pay

## 2010-03-28 NOTE — Telephone Encounter (Signed)
Pt called stating she is having severe ear pain with low grade fever. Pt says she believes she has another ear infection but cannot get appointment with [facility] until Thursday 03/29. Pt is requesting refill of Ceftin given last OV for same (02/16) to [facility]. Please advise.

## 2010-03-28 NOTE — Telephone Encounter (Signed)
Ok this time - to dahlia to forward med;  But please remind pt this would be very unusual to cont to need antibx more than 2-3 times per yr at most

## 2010-03-28 NOTE — Telephone Encounter (Signed)
I am uncomfortable with prescribing antibx every 1-2 mo for her recurring ear problem  If no fever, I would try to use OTC meds such as mucinex  Or tylenol 8 hr

## 2010-03-29 ENCOUNTER — Other Ambulatory Visit: Payer: Self-pay

## 2010-03-29 MED ORDER — CEFUROXIME AXETIL 250 MG PO TABS: 250.0000 mg | ORAL_TABLET | Freq: Two times a day (BID) | ORAL | Status: AC

## 2010-04-12 ENCOUNTER — Other Ambulatory Visit: Payer: Self-pay

## 2010-04-12 DIAGNOSIS — J329 Chronic sinusitis, unspecified: Secondary | ICD-10-CM | POA: Insufficient documentation

## 2010-04-12 HISTORY — DX: Chronic sinusitis, unspecified: J32.9

## 2010-04-12 MED ORDER — ACETAMINOPHEN-CODEINE 300-30 MG PO TABS: 1.0000 | ORAL_TABLET | Freq: Four times a day (QID) | ORAL | Status: DC | PRN

## 2010-04-12 MED ORDER — CEFUROXIME AXETIL 250 MG PO TABS: 250.0000 mg | ORAL_TABLET | Freq: Two times a day (BID) | ORAL | Status: AC

## 2010-04-12 NOTE — Telephone Encounter (Signed)
Pt called stating she has a sinus infection and both ears are also infected. Pt has a fever and mild body pains. Pt states that sinus infection is chronic and [facility] does not have any appt available until Friday of this week. Pt is requesting ABX to pharmacy (pt states MD is aware of chronic Hx Sinus infection and has called in ABX in the past w/o OV)

## 2010-04-12 NOTE — Telephone Encounter (Signed)
Done per emr 

## 2010-04-12 NOTE — Telephone Encounter (Signed)
Pharmacy requesting refill Acetaminophen - Cod #3 last refill 01/07/10 #120 with 2 refills and last OV 02/17/10.

## 2010-04-12 NOTE — Telephone Encounter (Signed)
Pt advised of Rx and pharmacy 

## 2010-04-12 NOTE — Telephone Encounter (Signed)
Faxed hardcopy to pharmacy. 

## 2010-05-17 NOTE — Consult Note (Signed)
NAME:  [name], [name]                 ACCOUNT NO.:  [id number]   MEDICAL RECORD NO.:  [id number]          PATIENT TYPE:  INP   LOCATION:  4710                         FACILITY:  [facility]   PHYSICIAN:  Revonda Standard L. Rennis Harding, N.P. DATE OF BIRTH:  July 01, 1955   DATE OF CONSULTATION:  08/13/2007  DATE OF DISCHARGE:                                 CONSULTATION   CONSULTING SURGEON:  Anselm Pancoast. Zachery Dakins, MD   REQUESTING PHYSICIAN:  Corwin Levins, MD   REASON FOR CONSULTATION:  Abscess, right calf.   HISTORY OF PRESENT ILLNESS:  [name] is a 55 year old female patient  presented to the ER on August 11, 2007, complaining of 2 days worth of  redness to the right calf.  No drainage.  This began about a week after  she was exposed to a relative with an MRSA wound to the ankle.  She had  direct contact with the wound actually bursting the associated pustule  with this wound.  In the ER, she was started on Septra.  No drainable  abscess was documented and she was sent back for followup with Dr. Jonny Ruiz  today.  She has had no improvement in this area per her report, and she  has a well-circumscribed circular lesion about the size of a nickel on  her right leg that appears to need debridement.  Surgical consultation  was requested.   REVIEW OF SYSTEMS:  As per the history of present illness.  SKIN:  The  patient does not recall any trauma or insect bite to this area.   FAMILY MEDICAL HISTORY:  Noncontributory.   SOCIAL HISTORY:  No alcohol, no tobacco, no drugs.  She is married.   PAST MEDICAL HISTORY:  1. CHF.  2. Breast cancer.  3. SVT.  4. Status post radiofrequency ablation.  5. Syncope.  6. Dysautonomia.  7. Depression.  8. Hypertension.  9. GERD and peptic ulcer disease.  10.Chronic PICC line for IV fluids, uncertain etiology of fluid loss.  11.Internal hemorrhoids.   PAST SURGICAL HISTORY:  1. Left mastectomy.  2. Small bowel obstruction with ischemic bowel status post right  hemicolectomy.  3. Laparoscopic cholecystectomy, Dr. Johna Sheriff has participated in her      surgeries.   ALLERGIES:  1. PENICILLIN.  2. NITROGLYCERIN.  3. LATEX.   CURRENT MEDICATIONS:  Since hospitalization are as follows:  1. IV vancomycin  2. Doxycycline.  3. Claritin.  4. Effexor.  5. Tenormin.  6. Flonase.  7. Adderall.  8. Protonix.  9. Celebrex.  10.Zofran.  11.Meclizine.  12.Bentyl.  13.Xanax.  14.Ambien.  15.Oxycodone.  16.Senokot.   PHYSICAL EXAMINATION:  GENERAL:  Pleasant but somewhat anxious female  patient who complains of right leg pain.  VITAL SIGNS:  Temperature 97.1, BP 132/85, pulse 81 and regular, and  respirations 18.  EYES:  Sclerae are noninjected.  Conjunctivae pink.  Pupils are equal  and reactive to light.  EARS, NOSE, MOUTH, AND THROAT:  Ears are symmetrical.  No otorrhea.  Nose is midline.  No rhinorrhea.  Mouth, oral mucous membranes are pink  and moist.  LUNGS:  Respirations are normal.  Bilateral lung sounds are clear to  auscultation.  She is on room air.  CARDIOVASCULAR:  Heart sounds are S1 and S2 without obvious rubs,  murmurs, thrills, or gallops.  No JVD.  No tachycardia.  Pulse is  regular.  ABDOMEN:  Soft.  Bowel sounds are present.  Abdomen is nontender.  No  obvious hepatosplenomegaly.  MUSCULOSKELETAL:  Extremities are symmetrical without clubbing or  cyanosis.  SKIN:  On the right lower extremity just below the posterior fossa  behind the knee and just right above the calf area, there is a well-  circumscribed circular lesion of 1.5 x 1.5 in diameter about the size of  a nickel if you were estimating.  The base of this is a 100% yellow  fibrin with a dark necrotic center that is about 3 mm and irregular  shaped.  There is a red halo surrounding the wound borders.  There is no  drainage.  There is no fluctuance.  There is subtle induration, which  has contained just around the border of this wound.  There are mild   cellulitic skin changes otherwise to the calf without any streaking  marks going up or down the lower extremity.  NEUROLOGIC:  Cranial nerves II through XII are grossly intact.  Sensation is intact in the upper and lower extremities bilaterally.  PSYCH:  The patient is oriented to time, person, place, and situation.  Her affect is appropriate, although she is somewhat anxious especially  due to the pain.   LABORATORY DATA:  Sodium 137, potassium 4.1, CO2 of 24, glucose 100, BUN  12, and creatinine 0.95.  White count on August 12, 2007, was 7300,  hemoglobin 13.6, and platelets 261,000.   DIAGNOSTICS:  None related to this hospitalization.   IMPRESSION:  Cellulitis with abscess of the right calf, presumed  methicillin-resistant Staphylococcus aureus.   PLAN:  1. We will have Dr. Zachery Dakins evaluate the wound, potential for      bedside I&D versus OR I&D.  The patient has eaten today and it is      late today, so operative intervention is recommended; this will      proceed in the morning on August 14, 2007.  2. We would contact isolation empiric antibiotic therapy to cover      MRSA, this wound looks like an insect bite.  Due to its location      near the posterior fossa, she still could have a fluid collection      within the fossa that is unable to be truly delineated at this      time.       Allison L. Rennis Harding, N.P.     ALE/MEDQ  D:  08/13/2007  T:  08/14/2007  Job:  [id number]

## 2010-05-17 NOTE — Op Note (Signed)
NAME:  [name], [name]                 ACCOUNT NO.:  [id number]   MEDICAL RECORD NO.:  [id number]          PATIENT TYPE:  INP   LOCATION:  4710                         FACILITY:  [facility]   PHYSICIAN:  Sharlet Salina T. Hoxworth, M.D.DATE OF BIRTH:  1955-08-13   DATE OF PROCEDURE:  08/13/2007  DATE OF DISCHARGE:                               OPERATIVE REPORT   BRIEF HISTORY:  Pridmore is a 55 year old female who presents with a  several-day history of a initially small pustule, then enlarging area of  skin discoloration and redness on her proximal posterior right calf.  She has been exposed to these very recently with MRSA skin infection and  also has been found to have MRSA UTI.  I have recommended incision,  drainage, and debridement at the bedside.   DESCRIPTION OF PROCEDURE:  The area was sterilely prepped and draped.  The surrounding skin and subcutaneous tissue was infiltrated with local  anesthesia.  I then sharply excised the well demarcated area of necrotic  skin which measured about a centimeter to centimeter-and-half in  diameter and carried this down into the subcutaneous tissue down to a  healthy tissue.  There was no frank purulence.  The wound was cultured.  It was dressed with moist saline gauze.      Lorne Skeens. Hoxworth, M.D.  Electronically Signed     BTH/MEDQ  D:  08/13/2007  T:  08/14/2007  Job:  [id number]

## 2010-05-17 NOTE — Assessment & Plan Note (Signed)
[facility]                                 ON-CALL NOTE   TYANNE, DEROCHER                        MRN:          [id number]  DATE:08/25/2006                            DOB:          21-Aug-1955    Ms. Beckers's home health nurse had called the office of Dr. Jonny Ruiz today.  Dr. Jonny Ruiz asked me to evaluate the patient.  I have followed her.  She  has long-standing irritable bowel syndrome and diarrhea and hemorrhoids.  She has had a colon resection with right hemicolectomy, this contributed  to her diarrhea.  Extensive workup has not revealed cause of diarrhea  other than irritable bowel syndrome.   She had seen Dr. Jonny Ruiz on Monday, complaining of diarrhea and rectal  bleeding.  She is (according to the home health nurse) using her Tylenol  #3 for diarrhea occasionally, but not regularly.  She, apparently, still  has some of this.  She has had seepage of blood spontaneously without  stool.  This has been going on for about a week.  Her pulse is 108, she  is otherwise unchanged.   PLAN:  I asked the home health nurse to run a stat CBC.  My suspicion is  that this is hemorrhoidal bleeding associated with irritable bowel  diarrhea.  I have seen a pattern like this before in the patient more  than once.  We will prescribe Anusol HC suppository b.i.d. for 7 days.  She is to take one of these at night.  I will await the results of the  CBC and plan further action.     [name], MD,FACG  Electronically Signed    CEG/MedQ  DD: 08/25/2006  DT: 08/26/2006  Job #: [id number]   cc:   Corwin Levins, MD

## 2010-05-17 NOTE — Assessment & Plan Note (Signed)
[facility]                         GASTROENTEROLOGY OFFICE NOTE   IKEYA, BROCKEL                        MRN:          [id number]  DATE:08/29/2006                            DOB:          November 04, 1955    CHIEF COMPLAINT:  Rectal bleeding, abdominal pain.   HISTORY OF PRESENT ILLNESS:  [name] has been having problems with five  spells of spontaneous patches of bright red blood per rectum over the  past week or two.  She thinks that an infraumbilical burning sensation  occurs before that.  I had spoken to her Home Health nurse on Friday,  five days ago, about this.  A CBC was drawn, and after Clemencia has left  the office, we have gotten the results finally that shows that her  hemoglobin was normal.  Hematocrit normal at 14.3 and 40 and a white  count of 5.6.  She has known hemorrhoids.  Otherwise, she has been  stable with intermittent diarrhea problems.  She is not really using her  Tylenol #3 more than at bedtime.  The right sided abdominal pain does  not seem to be as much of a problem right now.  Her husband says they  have been managing with Zofran and probably promethazine suppositories  and keeping her out of the ER, increasing fluids when she has spells of  her irritable bowel flaring.  She saw Dr. Jonny Ruiz on August 19, he  recommended she get a CBC, coags and a B-met, and he prescribed some  oxycodone.  She did not do those because she said she was having  diarrhea.   PAST MEDICAL HISTORY:  Reviewed and unchanged, but includes:  1. Dysautonomia on Home IV fluids, followed by Dr. Graciela Husbands.  2. SVT with previous ablations.  3. Left subclavian DVT therapy, having been on Coumadin and then      Lovenox, not on anticoagulation now as she had hematuria on the      Lovenox.  4. Mixed hemorrhoids.  See most recently on November 05, 2005, on      flexible sigmoidoscopy.  5 . Colonoscopy January 05, 2005, showing hemorrhoids, otherwise, normal  exam with  random biopsies being normal, though she is status post right  hemicolectomy for cecal volvulus in the past related to adhesions.  1. Indwelling PICC-line.  2. SVT status post multiple ablations.  3. Breast cancer status post left mastectomy.  4. Depression/anxiety.  5. Hypertension.  6. Heat intolerance.  7. Sinusitis.  8. Ear infections.  9. History of thymoma.  10.Irritable bowel syndrome.  11.Chronic headaches.  12.Status post cesarean section.  13.Total abdominal hysterectomy and bilateral salpingo-oophorectomy.  14.Bilateral mastectomies and reconstruction.  15.Spigelian hernia repair.  16.Multiple laparotomies in the past.   MEDICATIONS:  Listed and reviewed in the chart, Effexor, Atenolol,  Zofran, Adderall, Tylenol, Protonix, Celebrex, multivitamin, Levsin.   ALLERGIES:  PENICILLIN.   PHYSICAL EXAMINATION:  GENERAL:  She refused her weight.  VITAL SIGNS:  Pulse 86, blood pressure 106/56.  ABDOMEN:  Soft, benign.  No organomegaly or mass.  Well-healed midline  infraumbilical scar.  There is  a little bit of fullness there, but I  think that is scar tissue.  RECTAL:  In the presence of a female medical staff, demonstrates brown  stool that is hemoccult positive.  There is no mass.  Anoscopic exam  shows inflamed hemorrhoids in the canal.  There is scanty stool on  rectal exam.  There are few flecks of bright red blood attached to the  hemorrhoids, and these come out with the introducer with the anoscope as  well .   Note:  She has started her hydrocortisone suppositories.  I have  recommended she use those twice a day for a week and then as needed.   PLAN:  1. Recheck CBC today.  2. Should she have persistent bleeding, it had been recommended in the      past that she follow up with Dr. Derrell Lolling of surgery, or one of the      other surgeons would be fine.  Apparently, after one      hospitalization, it was recommended and follow up and perhaps have      injection  therapy of her hemorrhoids.  This may be an appropriate      thing to do.  I think the abdominal pain is not related to her      bleeding.  I really think this his hemorrhoidal bleeding from the      description.  She has underlying irritable bowel syndrome, likely      the cause of the abdominal discomfort.  If things change, if she      has significant decline in her hemoglobin, then other investigation      may be needed.  I will call her with the results of the CBC      tomorrow.     [name], MD,FACG  Electronically Signed    CEG/MedQ  DD: 08/29/2006  DT: 08/30/2006  Job #: [id number]   cc:   Angelia Mould. Derrell Lolling, M.D.  Corwin Levins, MD

## 2010-05-17 NOTE — Assessment & Plan Note (Signed)
[facility]                 COUMADIN / CHRONIC HEART FAILURE CLINIC NOTE   DONI, BACHA                        MRN:          [id number]  DATE:06/06/2006                            DOB:          May 11, 1955    She is maintained currently on anticoagulation therapy with Lovenox 80  mg subcutaneously b.i.d. and escalating doses of warfarin due to her  recent diagnosis of upper vessel DVT associated with a vascular access  device. The patient has required increasing doses of Coumadin and has  maintained subtherapeutic INRs, most recently on today's INR equally 1.3  after mg of Coumadin for the past two days and 10 mg for the two days  prior to that. The patient states that she has been compliant with her  therapy and she does state that her short bowel drug absorption issue  has made drug absorption a greater issue for her over time. The patient  is having no untoward effects associated with her Lovenox at this time.  She has had only mild injection site bruising. Based on this information  and review of the patient's indications for use, I have discussed with  Dr.  Berton Mount and we have agreed to discontinue the patient's  warfarin and initiate continuous therapy with Lovenox 80 mg  subcutaneously b.i.d. The patient is aware to discontinue her warfarin  and begin this today or continue on her Lovenox treatment duration of  six weeks as per Dr.  Graciela Husbands.   At the patient's request, I have called this to Irving Burton, the pharmacist at  [facility] on [facility], telephone number [phone]. I have called in 80  mg syringes of Lovenox quantity of 60, one subcutaneous b.i.d. with one  refill. The patient will call with questions or problems. She  understands she needs to come in next week for a platelet count for  routine monitoring associated with this therapy.      Shelby Dubin, PharmD, BCPS, CPP  Electronically Signed      Duke Salvia, MD, [facility]  Electronically Signed   MP/MedQ  DD: 06/06/2006  DT: 06/06/2006  Job #: [id number]

## 2010-05-17 NOTE — Assessment & Plan Note (Signed)
[facility]                         ELECTROPHYSIOLOGY OFFICE NOTE   [name], [name]                        MRN:          [id number]  DATE:07/04/2006                            DOB:          06/10/1955    [name] comes in and she is actually feeling a whole lot better.  She  thinks it is because she is spending time in a cool pool.  She has also  been using a hyperbaric chamber that her father bought her which she  also thinks helps her symptoms.  As it relates to her globus symptoms,  Dr. Jonny Ruiz had somebody do an endoscopy and found some evidence of reflux  and ulcers.  She is now on PPI as well as antibiotics and she is feeling  so much better.   She did not go to see her doctor because apparently his wife is dying of  brain cancer.   PHYSICAL EXAMINATION:  VITAL SIGNS:  Blood pressure 120/82, pulse 96,  lungs clear.  HEART:  Sounds were regular.   ASSESSMENT/PLAN:  She did ask for a refill of hyoscyamine and I will  have to look something up about this because I do not know anything  about it.  She is currently not using the high support stockings.  Will  plan to see her again in about 6 months.     Duke Salvia, MD, [facility]  Electronically Signed    SCK/MedQ  DD: 07/04/2006  DT: 07/05/2006  Job #: [id number]

## 2010-05-17 NOTE — Assessment & Plan Note (Signed)
[name]                         ELECTROPHYSIOLOGY OFFICE NOTE   [name], [name]                        MRN:          [id number]  DATE:05/10/2006                            DOB:          07-Jun-1955    HISTORY OF PRESENT ILLNESS:  [name] comes in at the recommendation  of her primary physician, Dr. Efrain Sella who, because of the patient  request for more frequent intravenous fluid resuscitation, felt that she  should be seen.   [name] has been seen by Renie Ora at [facility] and Dr. Georgana Curio in  [place].  The working diagnosis is dysautonomia with some degree of POTS  with some concerns about multisystem atrophy or perhaps pure autonomic  failure.  This was true, notwithstanding a negative tilt table test.   She continues to have problems with recurrent syncope.  This occurred  primarily while standing.  She is able to walk for the most part without  too much difficulty.  Her other complaints include difficulty  swallowing.  She has also had increasing sensitivity to noise and more  problems with vision, particularly at low light levels.   She continues to have diarrhea.  She has been followed by Dr. Stan Head who apparently has undertaken multiple interventions to slow the  diarrhea.  The best thing that they have found is Tylenol #3 using  Tylenol as a Lourie Retz for codeine which apparently has been quite  successful.  I should note, related to her GI story, that she was  hospitalized in December 2007 for rectal bleeding and underwent surgery  for rectal prolapse.   During that hospitalization and other hospitalizations, she has been  known to have significantly labile blood pressures ranging from 150 down  to the systolics of 70.   She has significant heat intolerance.   I should also noted that there is her lifestyle has been extremely  stressful.  Her son was diagnosed with diabetes a year or two ago.  There was foreclosure  on her house in the last week.   MEDICATIONS:  1. Hyoscyamine.  2. Tylenol #3 h.s.  3. Adderall 20 b.i.d.  4. Zofran 4-8 b.i.d.  5. Atenolol 25 daily.  6. Effexor 150.   ALLERGIES:  PENICILLIN, AND WHILE HER CHART SAYS SHE IS ALLERGIC TO  CODEINE, CLEARLY THAT IS NOT THE CASE.   PHYSICAL EXAMINATION:  VITAL SIGNS:  Blood pressure 122/78, orthostatics  were undertaken which demonstrated a blood pressure of 133/93, pulse 80  supine, 148/101 with pulse of 85 sitting, 147/101 with pulse of 87  standing at 0 minutes, 146/99 with pulse of 89 standing at 2 minutes and  164/90 with pulse of 86 standing at 5 minutes.   IMPRESSION:  1. Recurrent tachy palpitations with a remote history of ablation with      repeat EP testing demonstrating no inducible arrhythmia.  2. Dysautonomia manifested by recurrent syncope, heat intolerance,      diarrhea, difficulty swallowing.  3. Hypertension.   [name] has a multitude of issues.  I told her that I would try and  talk with Dr. Berneice Gandy next week at [facility] at the heart rhythm  meetings; the patient will be seeing him in June.  In the interim, I  have suggested:  1. A 30-40 mm waist high support stockings.  2. Raising the head of the bed 4-6 inches.  3. Agree it is worth trying increasing fluid resuscitation.  I am      concerned about her hypertension, and so we have opted to try      initially half normal saline, although, I have discussed with the      family the issues related to oncotic value of the half normal      saline versus normal saline.   We will plan to see her when she returns from [place].     Duke Salvia, MD, [facility]  Electronically Signed    SCK/MedQ  DD: 05/10/2006  DT: 05/10/2006  Job #: [id number]   cc:   Honor Loh

## 2010-05-17 NOTE — Letter (Signed)
January 30, 2008    Jessica Bucco, MD  [facility] [place]  [address]  [place], [place] [address]   RE:  Jessica, [name]  MRN:  [id number]  /  DOB:  22-Aug-1955   Dear [name]:   I hope you are well; I gather that there have been some very hard times.   I look forward to your insights on [name].  Her MUO number is [id number]  [phone].  She comes in with a multitude of concerns.  She had been on  intravenous hydration 2-3 times a week for years.  She finally elected  to have that stopped last summer.   She has complaints of recurrent syncope.  This is mostly immediately  after exercise.  She has significant fatigue which is worse on the  second half of the day.  She finds that when she is in an environment  with a great deal of stimulation, for example at [facility] basketball game,  that she becomes dizzy and can loose consciousness.  She has arranged to  have hyperbaric oxygen therapy at home which she says helps.  She had  problems with her memory as well, recalling an event where she could not  remember how to saddle her horse.   She also describes episodes where half of her face is cold and half of  her face is hot.  She also describes where her midline is cold to  palpation and was told that she had a neuropathy by her primary care  physician.  She spoke with Dr. Rickard Patience at [facility], who told her that  the cold and hot sensation was most likely a cardiac issue.   Of similar significance, she had an abscess in her lower leg that  required incision and drainage last summer with a meth-resistant staph.  She comes in today as mentioned with the above complaint.  She has also  intercurrently been started on prednisone for some wheezing with a  bronchial infection.  That is of some note because her blood pressures  today are 155/105.  There is no orthostatic change although there is  orthostatic dizziness.  I should note that over the last couple of  years, we have had  intermittent hints of diastolic predominantly and  some systolic hypertension.   She does not let us report her weight, but she is clearly much heavier  than she has been in the past.  Her lungs were clear.  Her heart sounds  were regular.  Her abdomen was soft and I did not appreciate the cold  line in the midline.  The extremities were without edema.   IMPRESSION:  1. Dysautonomic symptoms with recent aggravations of      a.     Postexercise syncope.      b.     Fatigue.      c.     Poor memory.  2. Worsening hypertension.  3. Previous abdominal surgery.  4. Palpitations - quiescent.   Ms. [name] continues to have a multitude of concerns and I really  look forward to your insight because I do not know quite how to pull it  all together.  In the interim, I have recommended to her that we try  support stockings again as a non-pharmacological way to prevent  orthostatic symptoms and pooling to potentially reduce her syncope.  As  relates to exercise, I suggested that water aerobics might be helpful  and treadmill in a location  where she can sit down so that she does not  stop exercising without having a place where she can reduce the post  exercise ortostatic stress.   Again, I hope you are well.  I look forward to seeing you at  [facility] in  [place].   Fondly,    Sincerely,      Duke Salvia, MD, [facility]  Electronically Signed    SCK/MedQ  DD: 01/30/2008  DT: 01/31/2008  Job #: [id number]

## 2010-05-17 NOTE — Assessment & Plan Note (Signed)
[facility]                         ELECTROPHYSIOLOGY OFFICE NOTE   [name], [name]                        MRN:          [id number]  DATE:12/13/2006                            DOB:          [birth year]    [name] was hospitalized last week for unrelenting chest pain.  She  underwent catheterization which was normal.  She has had problems with  chronic right upper quadrant pain which is attributed to adhesions from  her partial colectomy.  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  She was seen by Dr. Rickard Patience at [facility] and Dr.  Princella Pellegrini.  There is some question that has been raised by remarks of  one of her physicians regarding the other.  She continues to have  problems with fatigue and sleepiness, and syncope which is frequently  post either urination or defecation.  She also remains heat intolerant.   MEDICATIONS:  1. Effexor 150.  2. Atenolol 25.  3. Adderall 20.  4. Tylenol #3.  5. Oxycodone 5 at night.  6. Benadryl 50 at night.  7. Potassium 99 mg.  8. Celebrex 200.   PHYSICAL EXAMINATION:  VITAL SIGNS:  Her blood pressure was 136/94.  Her  pulse was 91.  LUNGS:  Clear.  HEART:  Sounds were regular.   PLAN:  Will be for me to try and get up with Dr. Kris Hartmann and to see who he  might recommend as an autonomic neurologist given the uneasiness which  has been generated regarding the other alternatives.  She is to let us  known next week how it is that she is feeling.     Duke Salvia, MD, [facility]  Electronically Signed    SCK/MedQ  DD: 12/13/2006  DT: 12/13/2006  Job #: [id number]

## 2010-05-17 NOTE — Assessment & Plan Note (Signed)
[facility]                         ELECTROPHYSIOLOGY OFFICE NOTE   [name], [name]                        MRN:          [id number]  DATE:07/26/2006                            DOB:          [birth year]    [name] returns today for an unscheduled visit for evaluation of  ecchymoses over her abdominal region, as she has been on Lovenox.  This  is a very pleasant middle-aged woman with dysautonomia and recently  found to have a clot in her PICC line in her right arm.  The patient  apparently has had a PICC line used for intravenous fluid injections now  for several years and was subsequently found to have a clot outside of  her PICC line and she had this removed and was placed on Coumadin with  no therapeutic anticoagulation and subsequently changed over to  subcutaneous Lovenox.  The patient returns today for followup.  She  denies chest pain or shortness of breath and overall feels well, though  she does complain of swelling around her abdominal section and severe  ecchymoses.  She has subsequently had a new PICC line placed in the left  arm.  The patient denies chest pain  or shortness of breath, or  peripheral edema.   PHYSICAL EXAMINATION:  She is a pleasant middle-aged woman in no  distress.  Blood pressure was 128/78, the pulse 84 and regular, the respirations  were 18.  NECK:  Reveals no jugulovenous distention.  LUNGS:  Clear bilaterally to auscultation, no wheezes, rales or rhonchi.  CARDIOVASCULAR:  Exam revealed a regular rate and rhythm with normal S1  and S2.  ABDOMEN:  Exam demonstrated multiple ecchymotic areas over the abdominal  cavity anteriorly.  EXTREMITIES:  No edema.   IMPRESSION:  1. Ecchymoses over the abdominal cavity.  2. The patient complains of bright red blood per rectum and has a      history of internal hemorrhoids.  3. Chronic Lovenox therapy.  4. History of clot at her peripherally inserted central catheter  site      diagnosed over 2 months ago.   DISCUSSION:  As the patient now is over 2 months out from Lovenox, I  have recommended that she stop her Lovenox.  She will continue along  with her present medical therapy and call us if she has any problems,  specifically shortness of breath, which might be a hint that she has had  another clot and potential embolism.  Because she is asymptomatic for  now except for her hemorrhoidal bleeding which began with Lovenox, I  have asked that she follow up with [place] as previously scheduled and call  if she does not have cessation of her rectal bleeding, at which point we  would ask her to see the GI specialist.    Doylene Canning. Ladona Ridgel, MD  Electronically Signed   GWT/MedQ  DD: 07/26/2006  DT: 07/27/2006  Job #: [id number]

## 2010-05-17 NOTE — H&P (Signed)
NAME:  [name], [name]                 ACCOUNT NO.:  [id number]   MEDICAL RECORD NO.:  [id number]          PATIENT TYPE:  EMS   LOCATION:  MAJO                         FACILITY:  [facility]   PHYSICIAN:  Noralyn Pick. Eden Emms, MD, FACCDATE OF BIRTH:  1955/12/07   DATE OF ADMISSION:  06/12/2007  DATE OF DISCHARGE:                              HISTORY & PHYSICAL   PRIMARY CARDIOLOGIST:  Duke Salvia, MD, [facility]   PRIMARY CARE Francisco Ostrovsky:  Corwin Levins, MD   The patient also sees Dr. Rickard Patience and also Dr. Princella Pellegrini at [facility].   PATIENT PROFILE:  A 55 year old married Caucasian female with a history  of chest pain, palpitations, syncope in the setting of POTS, and  dysautonomia who presents with recurrent chest pain, palpitations and  new complaint of swelling.   PROBLEMS:  1. History of chest pain.  1A.  Normal coronary arteries, cardiac catheterization in December 2008.  1. History of dysautonomia/POTS with syncope (primarily related to      micturition and defecation).  2A.  Negative tilt table test in June 2005.  2B.  December 04, 2006, 2-D echocardiogram EF 55-65%.  2C.  The patient lifts her head of bed up to 6 inches.  She is  previously intolerance to waist high stockings secondary to heat  intolerance.  1. History of SVT, status post radiofrequency ablation at [facility] and      then 2 times at [facility].  2. Breast cancer status post left mastectomy.  3. History of small bowel obstruction with bowel ischemia status post      right hemicolectomy.  4. Depression.  5. Hypertension.  6. Heat intolerance.  7. Status post lap cholecystectomy.  8. Left subclavian artery DVT in May 2008, previously on Lovenox.      Coumadin was felt to be poorly absorbed with subtherapeutic INRs.  9. GERD.  10.Peptic ulcer disease.  11.Chronic PICC line for IV fluids every other day.  12.Internal hemorrhoids with history of bright red blood per rectum.   HISTORY OF PRESENT ILLNESS:  A 55 year old married  Caucasian female with  history of normal coronary arteries, POTS/syncope and dysautonomia as  well as SVT status post radiofrequency ablation x3.  She has chronic  daily palpitations and irregularity associated with chest heaviness  lasting hours to days at a time.  She status post catheterization in  December 2008, for similar chest pain and that showed normal coronary  arteries.  With her history of POTS, she has a PICC line, which has been  in chronically.  It used to be in the left arm, but this was  discontinued in May 2008 secondary to DVT at that time.  She was  initially tried on Coumadin; however, secondary to inability to obtain  therapeutic INRs and it was felt that she was observing poorly in the  setting of previous bowel surgery, and she was switched to subcutaneous  Lovenox.  She is no longer on anticoagulation.  Her PICC line currently  is used for normal saline, which she receives 1 L every other day to  prevent  dehydration.  She has a home health nurse who administers this.  She was seen by her home health nurse this past Friday and told that she  looked swelling both in her upper extremities, hands, and face.  Since  then, she has noted continued swelling with progressive dyspnea on  exertion, decrease in exercise tolerance, palpitations, and which she  describes as both tachycardia and irregular.  She has also had chest  pain, which is not reproducible, but is almost exclusively associated  with her palpitations.  Because of this constellation of symptoms, she  presented to the [facility] ED today.  She continues to complain of 8/10  chest pain, although she is sitting up and at times laughing and  otherwise appears comfortable.  Troponin unfortunately is abnormal at  0.09.   ALLERGIES:  PENICILLIN, LATEX, and MORPHINE.   HOME MEDICATIONS:  1. Hyoscyamine sulfate 0.125 mg daily.  2. Adderall XR 20 mg b.i.d.  3. Atenolol 25 mg b.i.d.  4. Effexor 150 mg daily.  5.  Tylenol #3 p.r.n.  6. Dicyclomine 2 tablets b.i.d.  7. Zofran 4 mg IV b.i.d.  8. Potassium 99 mg daily.  9. Benadryl 50 mg nightly.  10.Oxycodone 5 mg nightly.  11.0.9 normal saline 1 L every other day.  12.Protonix 40 mg daily.   FAMILY HISTORY:  Mother is alive at age 64 with history of Prinzmetal  angina.  Father is 85 with history of CAD and prostate CA.  She had  three sisters, one died of ovarian cancer, the other two are alive and  well.   SOCIAL HISTORY:  She lives in [place] with her husband.  She denies  tobacco, alcohol, or drug use.  She says she does not exercise enough.   REVIEW OF SYSTEMS:  Positive for chest pain, shortness of breath,  dyspnea on exertion, upper extremity and facial edema, chronic  presyncope, sweating worse over this weakness, chronic nausea and  diarrhea.  Chronic heat intolerance.  Otherwise, all systems reviewed  negative.   PHYSICAL EXAMINATION:  VITAL SIGNS:  Temperature 97.1, heart rate 86,  respirations 18, blood pressure 127/80, and pulse ox 92% room air.  GENERAL:  Pleasant white female, in no acute distress.  Awake, alert,  and oriented x3.  HEENT:  Normal.  NEURO:  Grossly intact, nonfocal.  SKIN:  Warm and dry without lesions or masses.  NECK:  No bruits or JVD.  LUNGS:  Respirations were regular and unlabored. __________.  CARDIAC:  Regular S1, S2.  No S3, S4 murmurs.  ABDOMEN:  Round, soft, nontender, and nondistended.  Bowel sounds  present.  EXTREMITIES:  Warm, dry, and pink.  No clubbing, cyanosis, or edema.  Dorsalis pedis and posterior tibial pulses 2+ and equal bilaterally.   Chest x-ray shows no acute findings.  EKG shows sinus rhythm, small  inferior Qs and otherwise no acute findings.  Rate of 82 beats per  minute.   LAB WORK:  Hemoglobin 12.1, hematocrit 34.1, WBC 4.9, platelets 228, CK-  MB 3.4, troponin-I 0.09.   ASSESSMENT AND PLAN:  1. Chest pain atypical.  She had a cath in December 2008, which showed       normal coronary arteries.  She had similar pain at that time.      Troponin is slightly elevated by point of care markers.  We will      plan to observe and rule out.  She does have ongoing pain and for  this she would normally oxycodone for this at home.  We will go      ahead and get that started.  Hold off on anticoagulation right now.      Notably, D-dimer is negative.  2. Swelling.  Subjectively, there is no obvious finding on overall      exam.  We will hold off on IV fluids, which she is currently      receiving every other day.  We will check 24-hour urine for protein      and rule out nephrotic syndrome.  3. Palpitations.  Observe on telemetry.  She does have a history of      SVT and her complaints of palpitations consistent with both tachy      palpitations and irregularity.  She tells me that home health nurse      has told her that she hears irregularities when she listens with a      stethoscope.  Check TSH and electrolytes.  4. Dysautonomia/postural orthostatic tachycardia syndrome, see #2.      Nicolasa Ducking, ANP      Noralyn Pick. Eden Emms, MD, [facility]  Electronically Signed    CB/MEDQ  D:  06/12/2007  T:  06/13/2007  Job:  [id number]

## 2010-05-17 NOTE — Assessment & Plan Note (Signed)
[facility]                                 ON-CALL NOTE   [name], [name]                          MRN:          [id number]  DATE:10/20/2006                            DOB:          Aug 24, 1955    PRIMARY CARE PHYSICIAN:  Dr. Jonny Ruiz.   Phone number is [phone].   The patient has a history of 3 weeks of a pain.  Dr. Jonny Ruiz has evaluated  her and nobody can find out what the problem is.  Now she is in severe  pain and cannot stand it.  I advised her to go to [facility] Emergency  Room for evaluation.  Later on in the day, I got another call from the  husband stating they were in [facility] Emergency Room.  They  evaluated his wife and think she can go home.  He does not want that  done.  He wants her admitted.  I have explained to him he can talk to  the emergency room staff about having her admitted.     Jeffrey A. Tawanna Cooler, MD  Electronically Signed    JAT/MedQ  DD: 10/20/2006  DT: 10/21/2006  Job #: [id number]

## 2010-05-17 NOTE — Cardiovascular Report (Signed)
NAME:  [name], [name]                 ACCOUNT NO.:  [id number]   MEDICAL RECORD NO.:  [id number]          PATIENT TYPE:  OBV   LOCATION:  2038                         FACILITY:  [facility]   PHYSICIAN:  Arturo Morton. Riley Kill, MD, FACCDATE OF BIRTH:  07-06-1955   DATE OF PROCEDURE:  DATE OF DISCHARGE:                            CARDIAC CATHETERIZATION   INDICATIONS:  Ms. Runions is a 55 year old who presents with chest pain.  She has had a prior ablation.  Cardiac enzymes are negative and there  are no ST-segment changes.  The current study was done to assess  coronary anatomy after Dr. Dietrich Pates saw her in follow-up.  Of note, the  patient has a history of some internal hemorrhoids and was on Lovenox  because of an indwelling peripherally inserted central catheter line.  She now has been brought to the catheterization lab for further  evaluation.  Lovenox has been discontinued some time ago.   PROCEDURE:  1. Left heart catheterization.  2. Selective coronary arteriography.  3. Selective left ventriculography.   DESCRIPTION OF PROCEDURE:  The patient was brought to the cath lab and  prepped and draped in the usual fashion.  Through an anterior puncture,  the femoral artery was easily entered and a 5-French sheath was placed.  We used a JL-4 diagnostic 5-French catheter and a Noto right coronary  catheter.  Central aortic and left ventricular pressures were measured  with a pigtail. Ventriculography was performed in the RAO projection.  The patient also received 1.5 mg of Versed prior to the procedure, and  was relaxed during the procedure.  There were no complications.  She was  taken to the holding area in satisfactory clinical condition for sheath  removal.   HEMODYNAMIC DATA.:  1. Central aortic pressure was 126/70, mean 97.  2. Left ventricular pressure was 128/90.  3. There was no gradient on pullback across the aortic valve.   ANGIOGRAPHIC DATA.:  1. Ventriculography in the RAO  projection reveals preserved global      systolic function without segmental wall motion abnormality.  There      does not appear to be significant mitral regurgitation.  2. The left main coronary is free of critical disease.  3. The LAD courses to the apex.  There is a very large diagonal      branch.  The LAD, the septal and the diagonal all appear free of      critical disease.  4. The circumflex provides a small intermediate vessel, and then      terminates as a moderate-sized marginal branch that loops and      bifurcates distally.  There are no areas of high-grade obstruction      noted.  5. The right coronary artery is a moderate-sized vessel.  The right      coronary artery demonstrates a posterior descending and twin      posterolateral branches.  It is smooth throughout without      significant obstruction.   CONCLUSIONS:  1. Well-preserved left ventricular function.  2. No evidence of high-grade focal obstruction.   PLAN:  1. Two-D echo.  2. D-dimer.      Arturo Morton. Riley Kill, MD, [facility]  Electronically Signed     TDS/MEDQ  D:  12/04/2006  T:  12/04/2006  Job:  [id number]   cc:   Corwin Levins, MD  Gerrit Friends. Dietrich Pates, MD, [facility]  Cardiovascular Lab

## 2010-05-17 NOTE — Assessment & Plan Note (Signed)
[facility]                                 ON-CALL NOTE   TRICHA, RUGGIRELLO                        MRN:          [id number]  DATE:06/03/2006                            DOB:          [birth year]    Ms. Nachtigal is a patient of Dr. Odessa Fleming and was recently diagnosed with  left subclavian vein deep venous thrombosis and was initiated on  Coumadin therapy on May 24, 2006.  She has been on Lovenox bridging and  subsequently followed up in the [facility] on Jun 01, 2006  with an INR value of 1.1.  She did not receive a phone call with  instructions regarding either her INR or Coumadin dosing, and she was  curious to know both what her INR was as well as what she should do with  her Lovenox.  I advised her that her INR is 1.1.  Therefore, she needs  to maintain her Lovenox bridging.  She does have a refill on her  prescription.  I also advised her to take a total of 10 mg of Coumadin  tonight and that our office will be in contact tomorrow morning with  additional instruction and followup plans.  She will likely require  repeat INR on Tuesday or so.  If she does not hear from our office by  noon tomorrow, she is advised to call in for additional instruction.  She was grateful for the call back and verbalized understanding.      Nicolasa Ducking, ANP  Electronically Signed      Duke Salvia, MD, [facility]  Electronically Signed   CB/MedQ  DD: 06/03/2006  DT: 06/03/2006  Job #: [id number]

## 2010-05-17 NOTE — Assessment & Plan Note (Signed)
[facility]                                 ON-CALL NOTE   [name], [name]                        MRN:          [id number]  DATE:06/24/2006                            DOB:          1955/08/27    PRIMARY CARDIOLOGIST:  Berton Mount, M.D.   Ms. Butchko is a 55 year old female with a history of upper-vessel DVT and  difficulty being anticoagulated with Coumadin, resulting in her being  placed on q.12 h. subcutaneous Lovenox.  She also had marked  dysautonomia and SVT with multiple ablations, but no history of CAD.   Ms. Kohlbeck stated that she had developed chest pain.  She stated it was a  pressure-type sensation.  She stated that she would have it at rest and  when she was walking around, and she was not clear on if it became worse  with ambulation.  I advised her that she really should be evaluated in  the emergency room, but she stated she did not wish to come there.  She  stated that she would come to the office tomorrow.  I once again advised  her that the best way to evaluate her would be to go ahead and evaluate  her today, and that would involve coming to the emergency room but she  refused.  She stated that she is compliant with the Lovenox and taking  her other medications as prescribed.  I have left a message with the  office to call her for an appointment and advised her to come to the  hospital by EMS if her symptoms worsened.  Of note, except for the chest  pain, she states she is doing fine.      Theodore Demark, PA-C  Electronically Signed      Bevelyn Buckles. Bensimhon, MD  Electronically Signed   RB/MedQ  DD: 06/24/2006  DT: 06/25/2006  Job #: [id number]

## 2010-05-17 NOTE — Assessment & Plan Note (Signed)
[facility]                         ELECTROPHYSIOLOGY OFFICE NOTE   Jessica, [name]                        MRN:          [id number]  DATE:07/09/2007                            DOB:          September 27, 1955    [name] comes in today with a loooong story.   Her first complaint is progressive exercise intolerance.  This has been  manifested over the last month or two and has been progressively worse.  Concurrent with this has been progressive swelling of her hands and  face.  It should be noted that she has been on PICC line mediated IV  rehydration for the last couple of years under the care of Dr. Oliver Barre, receiving 2-3 liters of fluid a week.  This has never been clear  to me what the source of fluid loss is, but I presume to be GI.  In any  case, the edema is new and concurrent with a exercise intolerance.   About a month ago, she had a terrible episode of chest pain.  It was 8-  10/10 and because of protracted symptoms, she went to the emergency room  where her troponin was noted to be 0.09.  Because of no pain medicines  after 12 hours she asked her husband to come pick her up and take her  home.   She also describes episodes of passing out and presyncope associated  with bending her head over to the right.  There were some concern on  somebody to have that there was swelling in her right neck, Dr. Jonny Ruiz saw  her submitted her for CT scanning, which demonstrated some asymmetric  submandibular size felt to be within normal limits.   Her husband's concern is something that is terrible is going to happen  to his wife.   CURRENT MEDICATIONS:  1. Effexor 150 mg.  2. Atenolol 25 mg.  3. Zofran 4 b.i.d.  4. Adderall 20 mg taking twice daily.  This has been modified over      recent months, but not clearly under whose care.  5. Tylenol #3.  6. Protonix 40 mg.  7. Celebrex 200 mg.  8. Oxycodone.  9. Benadryl.  10.Bentyl.  11.Zyrtec.  12.She also gets the IV fluids as noted previously.   PHYSICAL EXAMINATION:  VITAL SIGNS:  Today, her blood pressure is  123/83, her pulse was 75 and again she declined to be weighed.  LUNGS:  Clear.  HEART:  Her heart sounds were regular.  HEENT:  Her face was swollen and somewhat edematous.  There is no  submandibular lymphadenopathy on the right.  There was a small lymph  node on the left, but fullness that was I thought was greater on the  left.  The tenderness that she described in her right submandibular area  seem to emanate from the cranial insertion of the sternocleidomastoid,  but it myself there is a bony structure that I am not quite sure whether  it is a bony structure of muscle body that was tender.   IMPRESSION:  1. Atypical chest pain with a borderline  abnormal troponins in the      middle aged woman with a previously normal catheterization within      the last year.  2. Normal Myoview in the last 5 years.  3. Facial and upper extremity swelling with concomitant weight gain.  4. Chronic intravenous therapy for fluid rehydration question source      of loss.  5. Dysautonomia.  6. History of tachycardia status post electrophysiology study and      ablation.  7. Palpitations without clinical correlation.  8. Adderall for her dysautonomia (see below).   [name] has a multitude of issues and not quite sure how the all come  together.   As relates to her chest pain.  I think it is reasonable to risk stratify  her with Myoview scanning and we will schedule that.   As relates to her swelling, she would like to have the PICC line  removed.  I told her I was not going to put it in nor I had prescribed  the fluids that this has been done by Dr. Jonny Ruiz and that she was free  from my point of view to discuss this with him and to have it removed.  I do think that the rehydration fluids may be contributing to her edema  and her exercise intolerance.  I anticipate that her LV  function will  still be normal.   I thought occur to be after she left that her face is may be somewhat  cushingoid, I suspect it as fluid, but when she comes in for her Myoview  scan, we will need to get as random cortisol level.   Her husband and she requested a refill of her Adderall today.  A month  ago, she had been given the prescriptions for the next 3 months.  They  came in today tell us that they had lost two of them.  In addition, the  dose of her Adderall has been increased.  She has gone from taking 1  pill a day to 2 pills a day.  It is not clear to me who has modified  that although she said that this was done in the setting of having  discontinued her pyridostigmine, which is certainly possible and not  unreasonable.   She was given 3 new prescriptions today for the months of July, August,  and September.  THERE ARE NOT TO REPLACED.  The medication will be  refilled at the end of this time.  I did discuss with the family the  importance of protecting prescriptions for these drugs as they are  controlled.    Duke Salvia, MD, [facility]  Electronically Signed   SCK/MedQ  DD: 07/09/2007  DT: 07/10/2007  Job #: [id number]

## 2010-05-17 NOTE — H&P (Signed)
NAME:  [name], [name]                 ACCOUNT NO.:  [id number]   MEDICAL RECORD NO.:  [id number]          PATIENT TYPE:  OBV   LOCATION:  2038                         FACILITY:  [facility]   PHYSICIAN:  Gerrit Friends. Dietrich Pates, MD, FACCDATE OF BIRTH:  09/19/1955   DATE OF ADMISSION:  12/03/2006  DATE OF DISCHARGE:                              HISTORY & PHYSICAL   PRIMARY CARE PHYSICIAN:  Corwin Levins, M.D.   GIPA:  [name], MD,FACG   CARDIOLOGIST:  Duke Salvia, MD, [facility]   CHIEF COMPLAINT:  Chest pain.   HISTORY OF PRESENT ILLNESS:  Ms. Eanes is a 55 year old female with no  previous history of coronary artery disease.  She has a history of SVT  and has had multiple ablations.  For many years after her SVT episodes  she gets chest pressure.  However, 3 days ago she developed intermittent  substernal chest pain that she states is completely different from this.  She feels like it is something inside her chest trying to get out as  opposed to somebody sitting on her chest, which is when she generally  feels.  She has had multiple episodes on the previous 2 days.  The  episodes are associated with shortness of breath and nausea.  She has  also had some diaphoresis.  The pain radiates through to her back and up  into her jaw.  She also has had paresthesias in her left arm with this.  She states the pain became almost continuous today and has not really  resolved all day.  Because of that, she came to the emergency room.  She  states she has never had this kind of pain with before.  She was  exercising, but in the last week she has not been to the gym because she  just was not feeling very well.  However, she has been walking dogs 3  miles at a time, and until a week ago was having no issues with this.  Although she did not feel well, she did not have chest pain until 3 days  ago.   PAST MEDICAL HISTORY:  1. Hypertension.  2. Family history of coronary artery disease.  3. History  of dysautonomia and frequent syncope.  4. She has SVT and multiple ablations for this.  She has had stress      testing in the past, and she reports that they have always been      normal.  5. She has significant issues with irritable bowel syndrome.  6. She had a left subclavian DVT secondary to a PICC line.  7. She was on Coumadin for this, but there were unable to get it      therapeutic, so she was on Lovenox for 2 months, and this has been      discontinued.  She has had some situational depression.  She has      had sinusitis and ear infections.  She has had a thymoma.   PAST SURGICAL HISTORY:  1. Right hemicolectomy.  2. History of PICC lines bilaterally.  3. Bilateral  mastectomy after seven lumpectomies.  4. Hysterectomy.  5. History of breast reconstruction.  6. C section.  7. Abdominal hernia.  8. Exploratory laparotomy x7.   ALLERGIES:  PENICILLIN.   CURRENT MEDICATIONS:  1. Hyoscyamine 0.375 mg q.a.m.  2. Adderall XR 20 mg b.i.d.  3. Atenolol 25 mg b.i.d.  4. Effexor 150 mg a day.  5. Tylenol No. 3 q.a.m.  6. Doxycycline 100 mg daily.  7. Zofran 4 mg daily a.c.   SOCIAL HISTORY:  She lives in [place] with her husband and children.  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  She has no history of alcohol, tobacco or drug  abuse.   FAMILY HISTORY:  Her mother and father are both in their 53s.  Her  father has a history of coronary artery disease starting in his 78s, and  her mother has a history of Prinzmetal angina.  She has three sisters,  one of whom is deceased with cancer but none of them have or have had  coronary artery disease.   REVIEW OF SYSTEMS:  She is having no current fevers or chills, but was  started on antibiotics for an ear and throat infection 9 days ago.  She  has presyncope 2-3 times a day and syncope about 3 times a week.  Chest  pain as described above.  She has dyspnea on exertion  which is chronic.  She denies any recent coughing or wheezing.  There is no edema.  When  she gets SVG, she has palpitations.  She has some situational depression  and anxiety at times.  She has diarrhea daily and right-sided abdominal  pain which she has had for about 18 months.  She has intolerance to  heat, which is chronic and unchanged.  Full 14-point review of systems  is otherwise negative.   PHYSICAL EXAMINATION:  VITAL SIGNS:  Temperature is 98.4, blood pressure  145/83, pulse 90, respiratory rate 16, O2 saturation 98% on room air.  GENERAL:  She is a well-developed, well-nourished white female in slight  discomfort.  HEENT:  Normal.  NECK:  There is no lymphadenopathy, thyromegaly, bruit or JVD noted.  CVA:  Heart is regular rate and rhythm with an S1-S2 and no significant  murmur, rub or gallop is noted.  Distal pulses are intact in all 4  extremities, and no femoral bruits are appreciated.  LUNGS:  Clear to auscultation bilaterally.  SKIN:  No rashes or lesions are noted.  ABDOMEN:  Soft and nontender with active bowel sounds.  EXTREMITIES:  There is no cyanosis, clubbing or edema noted.  MUSCULOSKELETAL:  There is no joint deformity or effusions and no spine  or CVA tenderness.  NEUROLOGIC:  She is alert and oriented.  Cranial nerves II-XII grossly  intact.   CHEST X-RAY:  No acute disease.  Right PICC line is at the junction of  the SVC and subclavian vein.   ELECTROCARDIOGRAM:  Sinus rhythm, rate 87, with no acute ischemic  changes and is unchanged from an EKG dated November 2007.   LABORATORY VALUES:  Pending at the time of dictation, but point of care  markers are negative x1.   IMPRESSION:  1. Chest pain.  Ms. Ontko was interviewed and examined by Dr.      Dietrich Pates, and prior records were reviewed.  The last episode of      chest pain where she sought medical attention was in 2003.  Now  she      has atypical symptoms, but they are recurrent, and radiation to  the      jaw is of concern.  It is doubtful that a negative stress test      would resolve all issues.  Risks and benefits of cardiac      catheterization were discussed with the patient and her husband and      they agree to proceed.  She is low risk for ACS, and therefore no      anticoagulation other than aspirin will be used.  IV nitroglycerin      will be added.  She will be started on nonsteroidals and a proton      pump empirically.  Narcotics will be used p.r.n., as well.  She      will be continued on her other home medications.  We will check a      lipid profile, a TSH and a sedimentation rate, well.      Theodore Demark, PA-C      Gerrit Friends. Dietrich Pates, MD, [facility]  Electronically Signed    RB/MEDQ  D:  12/03/2006  T:  12/03/2006  Job:  [id number]   cc:   Corwin Levins, MD  [name], MD,FACG

## 2010-05-17 NOTE — Discharge Summary (Signed)
NAME:  [name], [name]                 ACCOUNT NO.:  [id number]   MEDICAL RECORD NO.:  [id number]          PATIENT TYPE:  INP   LOCATION:  4707                         FACILITY:  [facility]   PHYSICIAN:  Duke Salvia, MD, FACCDATE OF BIRTH:  [birth year]   DATE OF ADMISSION:  05/24/2006  DATE OF DISCHARGE:                               DISCHARGE SUMMARY   The time for this dictation is greater than 40 minutes.   FINAL DIAGNOSES:  1. Left subclavian vein deep venous thrombosis.  2. Starting Coumadin therapy at [facility] May 24, 2006.   SECONDARY DIAGNOSES:  1. Marked dysautonomia requiring intravenous fluid rehydration every      other day.  2. PICC line to facilitate intravenous infusion.  3. Left PICC line dysfunction removed May 18 with reimplant of PICC      line in the right upper extremity.  4. Venogram of left upper extremity shows left subclavian vein      thrombosis.  Left arm has become swollen and especially the upper      extremity near the axilla.  5. History of SVT status post multiple ablations at [facility] and at [facility] for recurrence.  6. History of breast cancer status post left mastectomy.  7. Intestinal obstruction with bowel ischemia status post right hip      hemicolectomy.  8. Depression.  9. Hypertension.  10.Heat intolerance.  11.Current sinusitis.  12.Bilateral ear infections on Ceftin since May 21, 2006.   PROCEDURE:  IV heparin with administration of Coumadin starting this  hospitalization.   BRIEF HISTORY:  [name] is a 55 year old female.  She has some ongoing  problems with marked dysautonomia.  She has had frequent syncope and  dehydrates easily.  One successful therapy has been IV infusions of  normal saline every other day.  She takes 1 liter every other day  through an indwelling PICC line.   She presented to the emergency room May 18 with complaint of swelling of  the left arm.  On May 21, she  was seen for PICC line placement in the  right arm, and the left arm PICC line was discontinued.  At that time,  also a venogram was obtained and showed intraluminal filling defect at  the mid left subclavian vein.   The patient presents today May 22 for initiation of heparin and Coumadin  with Lovenox bridge until Coumadin reaches a therapeutic level.  The  patient complains that the arm and hand and fingers are swollen.  She is  unable to remove her rings.   HOSPITAL COURSE:  The patient presented from the office of [facility] after she had seen Dr. Graciela Husbands May 22 to start IV therapy for heparin  and also Coumadin therapy.  She received her first dose of Coumadin May  22, and she will continue for the next 3 months to achieve an INR  between 2 and 3.  She will have Lovenox subcutaneous injections twice  daily until her INR becomes therapeutic.   She  discharged on the following medications:  1. Adderall 20 mg twice daily.  2. Zofran 8 mg IV at 7:00 in the morning and 4:00 in the afternoon.  3. Ceftin 500 mg twice daily until she completes her total course.  4. Hyoscyamine 0.125 mg daily.  5. Atenolol 25 mg twice daily.  6. Effexor 150 mg daily.  7. Tylenol #3 at 0700 hours and at bedtime.  8. Normal saline IV 1 liter every other day through the PICC line.  9. Lovenox 80 mg prefilled syringes twice daily injections.  10.His Coumadin 5 mg tablets.  She is to take 7.5 mg Friday, Saturday,      Sunday and then 1 mg starting Monday and following.  She comes to      the [facility] Tuesday, May 27, at 3:30.  She will see Dr.      Graciela Husbands in follow-up Wednesday, July 2, at 12:30.   LABORATORY STUDIES:  Pertinent to this admission, on the day of  discharge, May 23, hemoglobin 12.8, hematocrit 36.8, white cells 5.3,  platelets 236.  Creatinine was 1.0 this admission.  Protime on the day  of discharge was 13.4, INR 1.0.  The D-dimer was taken; it was less than  0.22.   Of note,  on the day of discharge, the patient complained of a horrible,  horrible frontal headache that was pounding.  This was somewhat more  severe than her usual headaches.  She underwent a computed tomogram of  the head without contrast.  This was a negative study showing no acute  abnormality.  The patient was treated with Fioricet for headache.      Maple Mirza, [place]      Duke Salvia, MD, [facility]  Electronically Signed    GM/MEDQ  D:  05/25/2006  T:  05/25/2006  Job:  [id number]   cc:   Duke Salvia, MD, [facility]  Corwin Levins, MD

## 2010-05-17 NOTE — H&P (Signed)
NAME:  [name], [name] NO.:  [id number]   MEDICAL RECORD NO.:  [id number]          PATIENT TYPE:  INP   LOCATION:  4707                         FACILITY:  [facility]   PHYSICIAN:  Maple Mirza, PA   DATE OF BIRTH:  [birth year]   DATE OF ADMISSION:  05/24/2006  DATE OF DISCHARGE:                              HISTORY & PHYSICAL   PRIMARY CAREGIVER:  Dr. Oliver Barre.   ELECTROPHYSIOLOGIST:  Dr. Sherryl Manges.   ALLERGIES:  SHE HAS ALLERGIES TO PENICILLIN/LATEX and heat intolerance.   PRESENTING CIRCUMSTANCE:  Broke my left arm. It is swollen.   HISTORY OF PRESENT ILLNESS:  [name] is a 55 year old female with  ongoing problems concerning a marked dysautonomia.  She has a problem  with frequent syncope and dehydrates easily.  Unsuccessful therapy has  been IV infusion of normal saline every other day.  She takes one liter  every other day through an indwelling PICC line.   She presented to the emergency room May 20, 2006, with complaint of  swelling in her left arm.  On May 21 she was seen for PICC line  placement in the right arm, and the PICC line in the left arm was  discontinued.  Venograft in the left arm demonstrated intraluminal  filling defect at the mid left subclavian vein.   She presents today for initiation of heparin/Coumadin with Lovenox  bridging until Coumadin is therapeutic.  The patient complains that the  left arm, hand, and fingers are all swollen.  She is unable to remove  her rings.  There is some slight improvement after the PICC line has  been removed.   PAST MEDICAL HISTORY:  1. History SVT status post ablation of [facility].  She has      had recurrence of with prolonged ablations x2 at [facility].  Symptoms with SVT or palpitation, dizziness, and      fatigue.  She still has some breakthrough every once in a while.  2. History of breast cancer status post left mastectomy.  3. History of intestinal  obstruction with bowel ischemia, status post      right hemicolectomy.  4. Depression.  5. Hypertension.  6. Heat intolerance.  7. Laparoscopic cholecystectomy with lysis of adhesions.  8. Current sinusitis/bilateral ear infections on Ceftin since May 21, 2006.  9. Negative tilt study June 2005.   MEDICATIONS:  1. Adderall 20 mg b.i.d.  2. Zofran 8 mg IV at 0700 and 1600 hours.  3. Ceftin 500 mg p.o. b.i.d. started May 21, 2006.  4. Hyoscyamine 0.125 mg every morning.  5. Atenolol 25 mg twice daily.  6. Effexor 150 mg daily.  7. Tylenol #3 0700 and at bedtime.  8. Normal saline IV 1 liter every other day.   SOCIAL HISTORY:  Lives in [place] with husband.  She does not smoke.  She has high stressor or levels.  Her son is a diabetic, and her house  is threatened foreclosure for closure.  For dysautonomia, her head  of  bed is raised six inches.  She is unable to tolerate waist-high hose  which were suggested secondary to heat intolerance.   REVIEW OF SYSTEMS:  No chest pain, no dyspnea, no dyspnea on exertion,  no orthopnea.  She has no paroxysmal nocturnal dyspnea.  The edema that  she sees in the left arm is secondary to a DVT.  The patient is a not  having any lesions or nonhealing ulcerations.  She is not plagued with  nocturia or hematuria.  No epistaxis no photophobia.  Heat intolerance.  She has no history of diabetes, myocardial infarction, stroke, or GI  bleed.   EXAMINATION:  VITAL SIGNS:  Will be added later.  GENERAL:  She is alert and oriented x3.  No acute distress.  HEENT:  Eyes: Pupils equal, round, and reactive to light.  Extraocular  movements are intact.  Nares are patent.  Oropharynx is free of lesion  or erythema.  Dentition is well preserved.  The tongue is midline as is  the uvula.  NECK:  Supple.  No carotid bruits auscultated.  No jugular venous  distension.  No cervical lymphadenopathy.  LUNGS:  Clear to auscultation bilaterally.  HEART:   Regular rate and rhythm without murmur.  Telemetry showed sinus  rhythm.  ABDOMEN:  Soft, nondistended.  Bowel sounds are present.  EXTREMITIES:  Show no edema except for the left arm which is edematous  at the axillary area as well as the left upper extremity.  NEUROLOGIC:  No neurologic deficits.   PLAN:  1. The plan is to admit the patient to telemetry.  The patient has a      left subclavian vein DVT.  2. She is to continue her home meds.  3. She will have IV normal saline at 100 mL an hour.  She will have IV      heparin and set up Lovenox therapy.  4. Start Coumadin.  5. Lovenox teaching.  6. Pneumovax vaccine.      Maple Mirza, PA     GM/MEDQ  D:  05/24/2006  T:  05/24/2006  Job:  [id number]

## 2010-05-20 NOTE — Op Note (Signed)
NAME:  TATEANNA, BACH                 ACCOUNT NO.:  [id number]   MEDICAL RECORD NO.:  [id number]          PATIENT TYPE:  AMB   LOCATION:  DAY                          FACILITY:  [facility]   PHYSICIAN:  Sharlet Salina T. Hoxworth, M.D.DATE OF BIRTH:  [birth year]   DATE OF PROCEDURE:  12/01/2005  DATE OF DISCHARGE:                               OPERATIVE REPORT   PREOPERATIVE DIAGNOSIS:  Internal hemorrhoids with mucosal prolapse.   POSTOPERATIVE DIAGNOSIS:  Internal hemorrhoids with mucosal prolapse.   SURGICAL PROCEDURES:  PPH.   SURGEON:  Dr. Johna Sheriff   ANESTHESIA:  General.   BRIEF HISTORY:  Ms. Penland is a 55 year old female with progressively  worsening symptoms of bleeding, internal hemorrhoids with prolapse of  bowel movements.  Examination in the office and colonoscopy has  confirmed this diagnosis.  We have recommended proceeding with PPH.  The  nature of the procedure, indications, risks of bleeding, infection,  recurrence, and slight risk of urinary injury or rectovaginal fistula  were discussed understood.  She is now brought to the operating room for  this procedure.   DESCRIPTION OF OPERATION:  The patient brought to the operating room,  and general endotracheal anesthesia was induced on the stretcher.  She  was carefully rolled into the prone jack-knife position and the buttocks  taped apart and the perineum and anus and anal canal sterilely prepped  and draped.  Correct patient and procedure were verified.  Initially, 1  mL of Wydase diluted with 9 mL of Marcaine was injected into the 3  hemorrhoidal groups.  Following this, perianal block with 40 mL of  Marcaine was performed.  The anus was gently dilated to 4 fingers and  then a large bullet retractor placed.  Noted were large internal  hemorrhoids with significant mucosal redundancy and prolapse.  A 2-0  Prolene suture was then carefully placed circumferentially around the  anal canal, carefully measuring 5 cm proximal to  the dentate line.  Care  was taken to include only mucosa and submucosa.  Following completion of  the pursestring suture, the PPH anal retractor was placed, and the  pursestring was noted to be intact and circumferential.  The opened  stapler was then passed with anvil, popping through the pursestring  suture which was tied and the ends brought out through the stapler.  The  stapler was then carefully closed, advancing it into the anal canal 4-5  cm.  It was closed into the green zone and left in place for 60 seconds  and then fired open 1 turn and removed without difficulty.  The specimen  was examined.  It contained a nice, at least 1-2 cm wide cuff of mucosa  and submucosa with no muscle identified.  Prior to firing the stapler,  palpation through the vagina revealed no  impingement on the vagina.  Following this, the staple line was  inspected, and a couple of minor bleeding points were suture ligated  with 3-0 Vicryl and complete hemostasis assured.  A Gelfoam pack was  placed, and the patient was then taken to the recovery room in  satisfactory condition.      Lorne Skeens. Hoxworth, M.D.  Electronically Signed     BTH/MEDQ  D:  12/01/2005  T:  12/02/2005  Job:  [id number]

## 2010-05-20 NOTE — Consult Note (Signed)
NAME:  [name], [name]                 ACCOUNT NO.:  [id number]   MEDICAL RECORD NO.:  [id number]          PATIENT TYPE:  INP   LOCATION:  5736                         FACILITY:  [facility]   PHYSICIAN:  [name], MD,FACGDATE OF BIRTH:  Aug 23, 1955   DATE OF CONSULTATION:  DATE OF DISCHARGE:                                   CONSULTATION   REFERRING PHYSICIAN:  Valerie A. Felicity Coyer, MD, covering for Dr. Oliver Barre   REASON FOR CONSULTATION:  Abdominal pain.   ASSESSMENT:  A 55 year old white woman that I know from my practice that has  diarrhea predominant irritable bowel syndrome.  She is now admitted with  severe right-sided abdominal pain.  CT scanning is unrevealing except for  the fact that I saw that p.o. contrast did not reach the colon.  On physical  exam, she seems a little bit firmer in the right lower quadrant with an area  of focal tenderness adjacent to her midline abdominal scar.  This seems  somewhat different from other episodes of abdominal pain when I had seen  her, it may not mean anything but it does raise some questions as to whether  or not she has some partial obstruction, an internal hernia, or twisted loop  of bowel/adhesion process going on.  She does not have any worrisome  features like guarding, rebound tenderness, leukocytosis, or fever.  This  may be an episode of functional abdominal pain, but I do have, at least,  some concern for some sort of obstructive phenomenon versus ischemia versus  those things mentioned above.   PLAN:  1. Check the plain film of the abdomen.  2. Consider a small bowel series depending upon what that shows.  3. Discuss with Dr. Janee Morn of surgery who saw her in consultation.   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  Recently, she has been undergoing hyperbaric oxygen  therapy because of her dysautonomia  and neuropathy worsening and she says  that not only has it helped that, it has resolved her diarrhea problems.  She was admitted to the hospital in July and was observed and had a fair  amount of right-sided abdominal pain and was released.  She is admitted,  this time, coming in with nausea and vomiting, and severe right-sided  abdominal pain.  She does not feel any different since she was just admitted  on August 30, 2005.  She had a CT of the abdomen and pelvis that showed no  evidence of contrast in the colon, but was stable compared to April 28, 2005.  She does not have fever.  She has had some nausea and vomiting.   PAST MEDICAL HISTORY:  As described above including the dysautonomia,  previous colonoscopy with biopsy that were negative for colitis. She has had  a cholecystectomy in 2006, which provided some temporary relief from  symptoms as mentioned above.  There is a history of breast cancer status  post bilateral mastectomy and acute myeloma status  post iodine therapy.  She  has had anxiety and depression.   MEDICATIONS:  1. Adderall 20 mg b.i.d.  2. Effexor 150 mg daily.  3. Tenormin 25 mg b.i.d.  4. Morphine sulfate p.r.n.  5. Dilaudid p.r.n.  6. Benadryl p.r.n. itching.  7. Ciprofloxacin 400 mg IV q.12 hours.  8. Zofran p.r.n.   HOME MEDICATIONS:  Atenolol, Effexor, Adderall, Levsin, Zofran.   ALLERGIES:  PENICILLIN.   SOCIAL HISTORY:  She is married.  She is apparently disabled.  She has been  a Runner, broadcasting/film/video.   FAMILY HISTORY:  Noncontributory.   REVIEW OF SYSTEMS:  As mentioned above. I had made her appointment with [facility], but she did not go.  Apparently, there was some mix up regarding the appointment; she said she  was never notified by them.  Has peripheral neuropathy symptoms along with  her dysautonomia.  These have improved since she started hyperbaric therapy,  having had 8 treatments.  She says the hyperbaric  therapy was suggested by  her advanced home-care nurse who come to give her home IV fluids.   PHYSICAL EXAMINATION:  GENERAL:  A pleasant, slightly anxious, middle-aged  female acute distress.  VITAL SIGNS:  Temperature 97, blood pressure 124/90, pulse 80.  EYES:  Anicteric.  HEART:  S1, S2.  No murmurs, rubs, or gallops.  LUNGS:  Clear.  ABDOMEN:  Soft.  Bowel sounds are presents.  She is tender in the right  lower quadrant area as described above.  There is no mass or guarding, but  there is fullness in the right lower quadrant.  EXTREMITIES:  Without edema.  RECTAL:  Not performed.  SKIN:  Warm and dry.  NEURO:  She is alert and oriented x3.   LABORATORY DATA:  CBC, CMET are normal.  UA is negative.   I appreciate the opportunity to care for this patient.      [name], MD,FACG  Electronically Signed     CEG/MEDQ  D:  08/31/2005  T:  09/01/2005  Job:  [id number]   cc:   Corwin Levins, MD  Gabrielle Dare Janee Morn, M.D.

## 2010-05-20 NOTE — Op Note (Signed)
NAME:  [name], [name]                           ACCOUNT NO.:  [id number]   MEDICAL RECORD NO.:  [id number]                   PATIENT TYPE:  INP   LOCATION:  1825                                 FACILITY:  [facility]   PHYSICIAN:  Lina Sar, M.D. [facility]               DATE OF BIRTH:  [birth year]   DATE OF PROCEDURE:  DATE OF DISCHARGE:                                 OPERATIVE REPORT   PROCEDURE:  Colonoscopy with decompression.   INDICATIONS:  This 55 year old white female presented to the emergency room  earlier today with severe low abdominal pain.  CT scan and a barium enema  showed __________ deformity of the sigmoid colon consistent with volvulus.  CT scan raised a question of a cecal volvulus superimposed on normal sigmoid  colon.  The patient was unable to be decompressed by  a Gastrografin barium  enema.  She is undergoing colonoscopy to attempt for a decompression of  either sigmoid or cecal volvulus without prep.   ANESTHESIA:  Versed 5 mg, Fentanyl 37.5 mg IV.   FINDINGS:  The scope was passed through the transverse to the sigmoid colon.  The patient was monitored by pulse oximetry.  Oxygen saturation was normal.  Anal column and rectal ampulla were unremarkable.  There was a large amount  of stool in the sigmoid colon but the lumen was essentially normal.  There  was mild spasm in the sigmoid area but there were no diverticula, mucosa  noted to appear abnormal.  I was able to negotiate through the sigmoid colon  to the descending colon which appeared of normal caliber to the splenic  flexure, transverse colon, hepatic flexure.  Prep was adequate enough for me  to advance through into the right colon.  In the mid right colon I was  unable to advance through because the lumen appeared to be twisted or turned  at that point but a stat portable KUB plate showed position of the tip of  the scope at the mid ascending colon.  At that point it became apparent that  the point of  torsion was in the right colon more than the sigmoid colon.  A  #7 [place] Wilson decompression catheter was passed through the endoscope and  was left in the right colon.  Endoscope was then retracted and decompressed.  The catheter was then placed over the end of the perineum and it was taped  to the perineum.  The patient tolerated the procedure well.  After the  procedure the abdomen was still somewhat distended.   IMPRESSION:  1. Normal colonoscopy to the proximal ascending colon with suspicion of a     torsion in the right colon consistent with cecal volvulus status post     random biopsies of the right colon to rule out ischemic changes.  2. Placement of a #7 [place] Wilson catheter.  3. Portable KUB during the procedure.  PLAN:  See orders.  I have discussed with case with Dr. Glenna Fellows  and she will have another CT scan since the cecal distention and placement  of the decompression catheter.  A nasogastric tube has been inserted for  decompression.  Cipro 400 mg IV has been given.  The patient will likely  need a surgical intervention tonight.                                               Lina Sar, M.D. [facility]    DB/MEDQ  D:  06/11/2002  T:  06/11/2002  Job:  [id number]   cc:   Lorne Skeens. Hoxworth, M.D.  [address]  [place]  [place] [address]  Fax: [phone]

## 2010-05-20 NOTE — Discharge Summary (Signed)
NAME:  [name], [name]                 ACCOUNT NO.:  [id number]   MEDICAL RECORD NO.:  [id number]          PATIENT TYPE:  INP   LOCATION:  5715                         FACILITY:  [facility]   PHYSICIAN:  Stacie Glaze, MD    DATE OF BIRTH:  02/23/1955   DATE OF ADMISSION:  04/12/2006  DATE OF DISCHARGE:  04/15/2006                               DISCHARGE SUMMARY   ADMITTING DIAGNOSIS:  Abdominal pain.   DISCHARGE DIAGNOSES:  1. Abdominal pain secondary to probable constipation, history of      adhesions, and previous cholecystectomy colectomy for ischemic      bowel and volvulus, no current evidence of ischemic bowel.  2. History of autonomic dysfunction.  3. Depression.  4. Hypertension, stable.  5. Sinusitis.   HOSPITAL COURSE:  Patient is a 55 year old white female admitted to the  hospital to the general medicine service complaining of abdominal pain.  Laboratory workup was normal with normal white count, hemoglobin and CT  of the abdomen and pelvis.  Blood pressure and vital signs were stable.  The patient had persistent pain.  She was greatly concerned that this  represented a recurrence of the ischemic colitis/volvulus that she had  in the past and requested a surgical consultation.  On April 14, 2006, a  surgical consultation was obtained from Dr. Wenda Low who evaluated  both her CT scan and physically and found no evidence of a surgical or  infectious etiology of her abdominal pain and felt that she could be  discharged for home and management of her abdominal pain would be  appropriate outpatient.   It is apparent that there has been a change in her bowel movements.  Normally she states she has multiple bowel movements a day.  She has  been constipated for the past few days and it was recommended that she  take MiraLax 17 grams p.o. daily until her bowels begin to move.  It was  also noted that she was diagnosed with bilateral sinusitis prior to  admission.  She will be  discharged on Ceftin 500 mg p.o. b.i.d. for an  additional 7 days..  At the time of discharge, she was comfortable,  sitting in bed, conversing and eating without difficulty.  There was no  reported nausea or vomiting.   Her white count was 6.6.  Her CT of abdomen and pelvis was negative.   FINAL DIAGNOSES:  1. Abdominal pain.  2. Constipation.  3. Prior abdominal surgery.  4. Hypertension.  5. Autonomic dysfunction.      Stacie Glaze, MD  Electronically Signed     JEJ/MEDQ  D:  04/15/2006  T:  04/15/2006  Job:  [id number]

## 2010-05-20 NOTE — Consult Note (Signed)
NAME:  [name], [name]                           ACCOUNT NO.:  [id number]   MEDICAL RECORD NO.:  [id number]                   PATIENT TYPE:  INP   LOCATION:  1825                                 FACILITY:  [facility]   PHYSICIAN:  Sharlet Salina T. Hoxworth, M.D.          DATE OF BIRTH:  Dec 09, 1955   DATE OF CONSULTATION:  06/10/2002  DATE OF DISCHARGE:                                   CONSULTATION   CHIEF COMPLAINT:  Abdominal pain.   HISTORY OF PRESENT ILLNESS:  Ms. Witkop is a 55 year old white female who,  about six to eight hours prior to this evaluation, developed the sudden  onset of fairly severe lower abdominal constant pain.  She had to be urged  to defecate but has been unable.  No bowel movements or gas.  She gives a  history of many years of some difficulty with bowel movements requiring a  lot of time to move her bowels.  This has gotten gradually worse over the  last three months.  No melena or hematochezia.  No nausea or vomiting. No  fever or chills.   PAST SURGICAL HISTORY:  This is significant for C-section and multiple  laparotomies, [age] to [age], at [facility] for ovarian and GYN problems and  subsequent TAH/BSO.  She has had bilateral mastectomies with reconstruction.  She has had spigelian hernia repair.  She has had cardiac ablation for SVT  on three occasions.   CURRENT MEDICATION:  Effexor 75 mg a day.   ALLERGIES:  She is allergic to PENICILLIN.   SOCIAL HISTORY:  She is married.  She has two children.   HABITS:  She denies cigarettes or alcohol.   FAMILY HISTORY:  There is history of coronary artery disease in her father.   REVIEW OF SYSTEMS:  GENERAL:  No fever, chills, or weight change.  RESPIRATORY:  No shortness of breath or cough.  CARDIAC:  History of SVT as  above.  No recent symptoms.  GI:  As above.   PHYSICAL EXAMINATION:  GENERAL APPEARANCE:  She is a well-developed white  female who is uncomfortable and not in acute distress.  VITAL SIGNS:   Temperature is 96, pulse 64, respiratory rate 20, blood  pressure 136/76.  SKIN:  Warm and dry.  LUNGS:  Clear.  CARDIOVASCULAR:  Regular rate and rhythm without murmurs.  ABDOMEN:  There are well-healed incisions.  Lower abdomen is quite  distended.  There is a large, tender, firm, tympanitic mass in the lower  abdomen just to the right of midline.  EXTREMITIES:  Negative.   LABORATORY DATA:  Urinalysis, electrolytes, LFTs normal.  White count  12.5000, hemoglobin 15.8.   CT scan of the abdomen and pelvis reveals a large dilated loop of colon with  normal small-bowel consistent with a volvulus.  A Gastrografin enema was  obtained with a beak sign in the sigmoid colon appearing most consistent  with sigmoid volvulus.  ASSESSMENT AND PLAN:  Colonic volvulus, probably sigmoid volvulus.  I have  discussed the case with Dr. Juanda Chance and we would initially like to attempt  colonoscopic decompression to resolve this acute episode.  I would reserve  surgery for now if the colonoscopic decompression is not successful or if  the volvulus recurs.  She would need to be followed for any evidence of  delayed perforation.  She may eventually need elective resection with her  gradually worsening constipation and now volvulus at this relatively young  age.                                               Lorne Skeens. Hoxworth, M.D.    Tory Emerald  D:  06/10/2002  T:  06/11/2002  Job:  [id number]

## 2010-05-20 NOTE — Discharge Summary (Signed)
NAME:  [name], [name]                 ACCOUNT NO.:  [id number]   MEDICAL RECORD NO.:  [id number]          PATIENT TYPE:  INP   LOCATION:  0157                         [facility]ACILITY:  [facility]   PHYSICIAN:  [name], M.D. LHCDATE OF BIRTH:  1955/01/20   DATE OF ADMISSION:  01/04/2005  DATE OF DISCHARGE:  01/06/2005                                 DISCHARGE SUMMARY   ADMITTING DIAGNOSES:  1.  [age]-year-old white female with chronic diarrhea, now with acute      diarrhea, lower abdominal cramping and hematochezia, rule out      gastroenteritis with secondary hemorrhoidal bleeding due to diarrhea,      exacerbated by diarrhea, rule out gastroenteritis with secondary      ischemic colitis.  2.  Rule out underlying inflammatory bowel disease or microscopic colitis.  3.  Dysautonomia.  4.  History of cecal volvulus, status post right hemicolectomy.  5.  Status post cholecystectomy.  6.  History of breast cancer, status post bilateral mastectomy.  7.  History of thymoma, status post iodine therapy.  8.  Anxiety and depression.   DISCHARGE DIAGNOSES:  1.  [age]-year-old white female with chronic diarrhea of unclear etiology      with acute exacerbation, no evidence of bacterial infectious colitis or      ischemic colitis on colonoscopy, bleeding felt to be hemorrhoidal in      origin.  2.  Rule out underlying inflammatory bowel disease or microscopic colitis.  3.  Dysautonomia.  4.  History of cecal volvulus, status post right hemicolectomy.  5.  Status post cholecystectomy.  6.  History of breast cancer, status post bilateral mastectomy.  7.  History of thymoma, status post iodine therapy.  8.  Anxiety and depression.   CONSULTATIONS:  None.   PROCEDURES:  Colonoscopy with biopsies per Dr. Leone [name] on January 05, 2005.   BRIEF HISTORY:  [name] is a 55 year old white female known to Dr. Leone [name],  who has a history of chronic diarrhea over the past couple of years;  apparently, this  started that at some point after she had undergone a right  hemicolectomy for cecal volvulus in 2004.  She has been tried on a myriad of  medications for what is felt to be a irritable bowel syndrome, but  apparently has not had any good response with these including Lomotil,  Imodium, antispasmodics and Questran, though apparently she has not taken  most of these on any sort of a very regular basis.  She has undergone  testing for celiac disease, which is negative, small-bowel follow-through,  which was negative, and may have had some response to a course of Cipro at  one point within the past year.  She is admitted at this time with an acute  exacerbation of her diarrhea, having had 14 bowel movements over the past 24  hours and then associated lower abdominal cramping and rectal bleeding with  bright red blood and small clots.  She was admitted to the hospital for  hydration and further diagnostic evaluation including colonoscopy.   Laboratory studies on January 04, 2005 showed a WBC of 5.3, hemoglobin 12.9,  hematocrit of 36.5, MCV of 85, platelets 263,000.  Serial values were  obtained on January 06, 2005, hemoglobin of 13.2, hematocrit of 37.9.  Pro  time 13.4, INR of 1.  Electrolytes on admission within normal limits with a  potassium of 3.6, glucose 106, BUN 15, creatinine 0.8, albumin 3.8 and liver  function studies normal.   HOSPITAL COURSE:  The patient was admitted to the service of Dr. Leone [name] per  Dr. Christella [name], who was covering on-call.  She was placed on IV fluids and given  Dilaudid and Phenergan as needed for nausea and pain.  She underwent a bowel  prep on the evening of admission and then colonoscopy the following day with  Dr. Leone [name].  This was a normal postoperative exam with the exception of  hemorrhoids.  There was noted to be some semi-solid stool in the area of the  anastomosis and some in the ileum.  It was felt that irritable bowel  syndrome was her leading  diagnosis.  She did have random biopsies taken to  rule out a microscopic colitis.  Biopsies were pending at the time of  discharge, but have since returned.  The small bowel biopsies were benign  with no evidence of villous atrophy or inflammation and colonic biopsies did  not show any active colitis or dysplasia.  There was a rare atypical  lymphoid aggregate present.   The patient did have some nausea and vomiting after the sedation for  colonoscopy, but by the following day, was feeling somewhat better.  Her  usual medications were resumed and we got her back on Colestid as well as  Lomotil and Levbid.  We were able to advance her diet and by January 06, 2005, she was feeling well enough to be discharged to home with instructions  to follow up with Dr. Leone [name] in the office on January11, 2007 at 11:00 a.m.  and to call for any problems in the interim.   DISCHARGE MEDICATIONS:  She was to continue all of her usual medicines; in  addition to that:  1.  Colestid 1 g four times daily.  2.  Lomotil two pills 4 times daily.  3.  Zofran 4 mg every 6-8 hours regularly over the next 48 hours and then as      needed.  4.  Levbid one p.o. twice daily.  5.  ProctoCream 2.5% twice daily for 1 week and then p.r.n. thereafter.  6.  Cipro 500 mg twice daily for 7 days  7.  Flora Q one p.o. daily x7 days.   DIET:  Low residue.   PLAN:  The patient will be referred to Dr. Almyra [name] at [facility]  for further  comprehensive evaluation of her of chronic diarrhea.      [name], P.A.-C. [facility]      [name], M.D. [facility]  Electronically Signed    AE/MEDQ  D:  01/09/2005  T:  01/10/2005  Job:  [id number]

## 2010-05-20 NOTE — Consult Note (Signed)
NAME:  [name], [name]                 ACCOUNT NO.:  [id number]   MEDICAL RECORD NO.:  [id number]          PATIENT TYPE:  INP   LOCATION:  6727                         FACILITY:  [facility]   PHYSICIAN:  Angelia Mould. Derrell Lolling, M.D.DATE OF BIRTH:  February 11, 1955   DATE OF CONSULTATION:  11/06/2005  DATE OF DISCHARGE:  11/06/2005                                   CONSULTATION   REASON FOR CONSULTATION:  Hemorrhoids and rectal bleeding.   HISTORY OF PRESENT ILLNESS:  This is a 55 year old woman who has chronic  diarrhea for the past 2 to 3 years.  For the past 4 days or so, she has been  having a lot of rectal bleeding.  She has known that she has had some  hemorrhoids for at least 3 years but generally has very scant bleeding and  very intermittent bleeding.  Her diarrhea is chronic, and she reports as  much as 10 stools a day chronically.  She has been followed by Dr. Lina Sar for this as an outpatient and has been evaluated at the [facility] for this.   It is notable that she underwent an emergent right colectomy by Dr. Johna Sheriff  3 years ago at which time she had a cecal volvulus with sepsis.  She also  has a dysautonomia and has had multiple problems in the past.   She had a colonoscopy in February 2005 and January 2007 for chronic pain and  occasional bleeding and the colonic mucosa and the ileocolic anastomosis  always looked fine, and hemorrhoids were always noted.  She has a  dysautonomia which has improved on hyperbaric oxygen therapy.  She had a  small bowel follow-through in August of this year which showed no  obstruction and a CT scan August of this year which showed nothing acute.   She was admitted at this time on November 1, stated that she had large  volume hematochezia that began on October 31, also had had some discomfort.   Since her admission, the bleeding has completely stopped, and she is feeling  much better, has minimal discomfort.  She has undergone a  flexible  sigmoidoscopy by Dr. Melvia Heaps who felt that she had the internal and  external hemorrhoids, and I asked for a consult to see if she needs a  surgical hemorrhoidectomy.   PAST HISTORY:  1. Chronic autonomic insufficiency.  2. Right colectomy 3 years ago for ischemic colitis and cecal volvulus.  3. She has had breast cancer, has had bilateral mastectomies.  4. She had a hysterectomy and multiple ovarian cysts.  5. She had appendectomy and cholecystectomy.  6. She says she has a vagus nerve neuropathy.  7. She has chronic headaches.  8. She says she had a thymoma and received iodine therapy.  9. She has a history of depression.  10.She has a history of irritable bowel syndrome and chronic right-sided      abdominal pain and chronic diarrhea.   CURRENT MEDICATIONS:  1. Atenolol.  2. Effexor.  3. Adderall.  4. Zofran.   ALLERGIES:  PENICILLIN,  CODEINE, HYDROCODONE.   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  Does not use alcohol  or tobacco.   FAMILY HISTORY:  Ovarian cancer.   REVIEW OF SYSTEMS:  Documented in the chart, noncontributory except as  described above.   PHYSICAL EXAMINATION:  Very pleasant middle-aged white female in no  distress.  ABDOMEN:  Soft, nontender.  No focal tenderness.  Scars were noted.  RECTAL:  With the registered nurse present in the room, I did an external  rectal exam and a digital rectal exam.  Externally, she has circumferential  skin tags, but these are soft, nontender.  No thrombosis.  No dermatitis.  No edema.  No acute inflammatory process.  Digital exam reveals normal  sphincter tone.  The tissues are soft.  There is no palpable mass.  She  probably has internal hemorrhoids by palpation.  No blood was noted.   LABORATORY WORK:  Hemoglobin on November 1 was 13.2 and then on November 2  was 12.7 and yesterday was 12.0.  Prothrombin time is 12.9 with INR of 1.0.   ASSESSMENT:  1. Internal and external  hemorrhoids.  I suspect that the vast majority of      her symptoms are due to her internal hemorrhoids causing the bleeding      and the discomfort.  The symptoms have now resolved for the most part      but are likely to recur.  I suspect that the external hemorrhoids are      asymptomatic at this time.   PLAN:  1. I discussed options with her of medical therapy, banding and injection      therapy, stapled hemorrhoidectomy, and finally a formal internal and      external hemorrhoidectomy.  The pros and cons of all these procedures      were outlined with her.  2. Since she is asymptomatic, it okay for her to go home and follow-up      with me in the office at which time we can do a better exam with      anoscopy, consider injection therapy and/or decide whether to go to a      more definitive intervention.   I hope that we can avoid a formal internal-external hemorrhoidectomy due to  the morbidity of that procedure.  So long as the majority of her symptoms  are due to internal hemorrhoids, I think that we have a chance of avoiding  hemorrhoidectomy.      Angelia Mould. Derrell Lolling, M.D.  Electronically Signed     HMI/MEDQ  D:  11/06/2005  T:  11/07/2005  Job:  [id number]   cc:   Barbette Hair. Arlyce Dice, MD,FACG

## 2010-05-20 NOTE — Consult Note (Signed)
NAME:  [name], [name]                 ACCOUNT NO.:  [id number]   MEDICAL RECORD NO.:  [id number]          PATIENT TYPE:  INP   LOCATION:  5715                         FACILITY:  [facility]   PHYSICIAN:  Wilmon Arms. Corliss Skains, M.D. DATE OF BIRTH:  [birth year]   DATE OF CONSULTATION:  04/14/2006  DATE OF DISCHARGE:                                 CONSULTATION   CONSULTING PHYSICIAN:  Dr. Lovell Sheehan.   PRIMARY CARE PHYSICIAN:  Dr. Oliver Barre   REASON FOR CONSULTATION:  Chronic right lower quadrant pain.   The patient is a 55 year old female with an extensive past medical  history including chronic diarrhea, previous bowel obstructions  requiring a right hemicolectomy and multiple gastrointestinal complaints  and chronic autonomic insufficiency.  She was admitted to the hospital  on April 12, 2006, with increasing right lower quadrant pain.  She has  noticed a decrease in the number of her bowel movements.  She normally  has 10-12 episodes of diarrhea a day.  She denies any nausea, vomiting.  After admission she underwent a CT scan which was normal.  She has also  had plain films today which showed no sign of obstruction with contrast  in the colon.   PAST MEDICAL HISTORY:  1. Chronic autonomic insufficiency.  2. History of breast cancer.  3. Hysterectomy.  4. Vagus nerve neuropathy with autonomic insufficiency.  5. Chronic headaches.  6. Thymoma.  7. Irritable bowel syndrome.   PAST SURGICAL HISTORY:  1. Right hemicolectomy with primary anastomosis for bowel obstruction.  2. Hysterectomy.  3. Multiple ovarian cyst surgeries.  4. Spigelian hernia repair.  5. Laparoscopic cholecystectomy.  6. Bilateral mastectomies.   HOME MEDICATIONS:  Effexor, Midrin, atenolol, Lomotil, Zofran,  cholestyramine and Adderall.   ALLERGIES:  1. PENICILLIN.  2. CODEINE.   SOCIAL HISTORY:  No alcohol or tobacco use.   PHYSICAL EXAMINATION:  VITAL SIGNS:  98.4 temperature, pulse 71,  respirations 18,  blood pressure 128/80, sats 98% on room air.  GENERAL:  This is a well-developed, well-nourished female in no apparent  distress.  NEURO:  Awake, alert, oriented.  HEENT:  EOMI.  Sclerae anicteric.  NECK:  No mass, no thyromegaly.  LUNGS:  Clear.  Normal respiratory effort.  HEART:  Regular rate and rhythm.  No murmur.  ABDOMEN:  Positive bowel sounds, active, not high-pitched.  Minimal  distension.  Mild right lower quadrant tenderness but no rebound or  guarding.  No palpable masses.  EXTREMITIES:  No edema.   LABORATORY DATA:  CBC today:  White count 6.6, hemoglobin 13.2, platelet  count 251.  Labs on admission:  Lipase normal, amylase normal, liver  function tests within normal limits.   CT scan of the abdomen and pelvis on April 12, 2006, shows no acute  findings and no intraperitoneal free fluid.   IMPRESSION:  No surgical etiology apparent for her chronic right lower  quadrant abdominal pain.  There is no sign of obstruction.  Question  whether there might be some type of chronic pain syndrome versus some  psychological component to her complaints.  Would consider reconsulting  Dr. Leone Payor or Dr. Lina Sar, who have taken care of her in the past.  We will follow along with you.      Wilmon Arms. Tsuei, M.D.  Electronically Signed     MKT/MEDQ  D:  04/14/2006  T:  04/14/2006  Job:  [id number]

## 2010-05-20 NOTE — Discharge Summary (Signed)
NAME:  [name], [name]                           ACCOUNT NO.:  [id number]   MEDICAL RECORD NO.:  [id number]                   PATIENT TYPE:  INP   LOCATION:  5737                                 FACILITY:  [facility]   PHYSICIAN:  Sharlet Salina T. Hoxworth, M.D.          DATE OF BIRTH:  September 02, 1955   DATE OF ADMISSION:  06/10/2002  DATE OF DISCHARGE:  06/17/2002                                 DISCHARGE SUMMARY   DISCHARGE DIAGNOSES:  Obstruction ischemia of the right colon secondary to  adhesions.   OPERATIONS AND PROCEDURES:  Laparotomy with right hemicolectomy on June 11, 2002.   HISTORY OF PRESENT ILLNESS:  [name] is a 55 year old white female who  about six hours prior to this evaluation developed a sudden onset of fairly  severe lower abdominal pain which has been constant. She had the urge to  defecate but has been unable. No bowel movements or gas. She gets a history  of many years of some difficulty with bowel movements. She had never had any  symptoms similar to this. No recent melena, hematochezia, nausea, vomiting,  fever, or chills.   PAST SURGICAL HISTORY:  She has had a Cesarean section and multiple  laparotomies, seven or eight at [facility], for ovarian and GYN problems and  subsequent TAH/BSO. She has had bilateral mastectomies and reconstruction.  She has had a spigelian hernia repair. She had cardiac ablation for SVT on  three occasions.   CURRENT MEDICATIONS:  Effexor 75 mg a day.   ALLERGIES:  PENICILLIN.   SOCIAL HISTORY, FAMILY HISTORY, REVIEW OF SYSTEMS:  See dictated H&P.   PERTINENT PHYSICAL EXAMINATION:  GENERAL:  She is a well-developed white  female who appears uncomfortable but no acute distress. Pertinent findings  were limited to the abdomen which revealed well healed incisions. Her lower  abdomen was quite distended with a large tender tympanitic mass in the right  lower quadrant.   LABORATORY DATA:  White count elevated at 12,500, hemoglobin 15.8.  Urinalysis, electrolytes, LFTs normal.   Initially CT scan of the abdomen and pelvis was obtained in the emergency  room which revealed a large dilated loop of colon with normal small bowel  consistent with a volvulus. A Gastrografin enema was obtained which was felt  most consistent with a sigmoid volvulus. Dr. Juanda Chance saw the patient. A  colonoscopic decompression was attempted. She was able to get into the right  colon where there was an abrupt tapering and obstruction of the bowel,  possibly consistent with a volvulus of the right colon. Operation was  recommended. The patient was taken immediately to the operating room.  Findings there were an adhesive band from the sigmoid colon to the pelvis  which had trapped the cecum and ascending colon resulting in obstruction and  ischemia. She underwent a partial right colectomy with anastomosis. She  tolerated the procedure well. Her postoperative course was relatively  uneventful.  She had moderate pain that was controlled with PCA and Toradol.  By second postoperative day, her white count was normal, and her NG tube was  discontinued. She was passing flatus by June 12 and begun on clear liquids.  Her diet was  advanced to regular. Her wound healed primarily. She continued to improve  and was felt ready for discharge on June 15. Abdomen was benign and wounds  healing primarily. Pathology confirmed ischemia of the right colon. Followup  is to be in my office in one week.                                               Lorne Skeens. Hoxworth, M.D.    Tory Emerald  D:  07/08/2002  T:  07/09/2002  Job:  [id number]

## 2010-05-20 NOTE — Discharge Summary (Signed)
NAME:  [name], [name]                 ACCOUNT NO.:  [id number]   MEDICAL RECORD NO.:  [id number]          PATIENT TYPE:  INP   LOCATION:  4710                         FACILITY:  [facility]   PHYSICIAN:  Valerie A. Felicity Coyer, MDDATE OF BIRTH:  [birth year]   DATE OF ADMISSION:  08/12/2007  DATE OF DISCHARGE:  08/14/2007                               DISCHARGE SUMMARY   PRIMARY CARE PHYSICIAN:  Corwin Levins, MD.   DISCHARGE DIAGNOSES:  1. Right lower extremity abscess likely methicillin-resistant      Staphylococcus aureus.  2. Urinary tract infection with positive urinalysis on admission.   HISTORY OF PRESENT ILLNESS:  Ms. [name] is a 55 year old white female  with past medical history of hypertension, anxiety and depression who  presented to her primary care physician on day of admission with reports  of suspicious lesion on the right leg.  The [facility] one day prior to this admission, however, on the day of this exam,  the patient was with increased erythema and pain, but denied any fever  or chills.  Also of note, the patient reported her 39 year old niece was  hospitalized at [facility] approximately 2 weeks ago for MRSA abscess.  The patient was admitted from the office at that time for further  evaluation and treatment.   PAST MEDICAL HISTORY:  1. Bilateral mastectomy secondary to breast cancer.  2. History of SVT, status post previous ablation.  3. Right hemicolectomy.  4. History of GI bleed.  5. Hemorrhoids.  6. Thymus cancer.  7. History of DVT.  8. Anxiety.  9. Depression.  10.Hypertension.  11.Irritable bowel syndrome.  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  13.History of hysterectomy.  14.History of cholecystectomy.  15.History of dysautonomia.   CONSULTATIONS DURING THIS ADMISSION:  General Surgery,  Sharlet Salina T.  Hoxworth, M.D.   COURSE OF HOSPITALIZATION:  1. Right lower extremity abscess.  Again, the patient denied any fever      or  chills at time of admission; however, suspicion for MRSA.  At      time of admission, given niece's recent admission for same and the      patient did drain that wound at home prior to her niece's      admission.  The patient presented with several-day history of      initially a very small pustule on proximal posterior right calf      which since enlarged and has become discolored.  General Surgery      was asked to see the patient in consultation who performed incision      and drainage of the area on August 13, 2007.  Also at time of      admission, the patient placed on empiric vancomycin.  Wound      cultures have come back at time of dictation and were negative for      any growth.  Blood cultures x2 obtained on August 12, 2007, were      also negative for any growth.  The patient continued on empiric  antibiotic treatment at the time of discharge.  2. UTI with positive urinalysis on admit.  The patient's initial      urinalysis done at time of admission revealed large leukocytes with      11-20 wbc's.  The patient treated with antibiotic therapy as per      #1.   MEDICATIONS AT TIME OF DISCHARGE:  1. Bactrim DS 160-800 one tab p.o. b.i.d. until gone.  2. Cetirizine 2 mg p.o. daily.  3. Effexor XR 150 mg p.o. daily.  4. Atenolol 25 mg p.o. b.i.d.  5. Fluticasone nasal spray b.i.d.  6. Adderall XR 20 mg p.o. daily.  7. Protonix 40 mg p.o. daily.   PERTINENT LABORATORY:  At the time of discharge, white cell count 7.3,  platelet count 261, hemoglobin 13.6, and hematocrit 38.7.  Sodium 137,  potassium 4.1, BUN 12, and creatinine 0.98.  Liver function tests within  normal limits.  A1c 5.2.  Thyroid function tests within normal limits.  The wound culture with no growth.  Blood cultures x2 obtained on August 12, 2007, with no growth to date.   DISPOSITION:  The patient was felt medically stable for discharge home.   FOLLOWUP:  She is instructed to follow up with her primary  care  physician Dr. Oliver Barre on August 16, 2007, at 9:15 a.m.  The patient  was also instructed to call Dr. Jamse Mead office for an appointment  time in approximately 2 weeks.  In terms of wound care, the patient is  instructed to use moist saline gauze bandage to her right lower  extremity.      Cordelia Pen, NP      Raenette Rover. Felicity Coyer, MD  Electronically Signed    LE/MEDQ  D:  09/12/2007  T:  09/13/2007  Job:  [id number]   cc:   Corwin Levins, MD  Lorne Skeens. Hoxworth, M.D.

## 2010-05-20 NOTE — H&P (Signed)
NAME:  [name], [name] NO.:  [id number]   MEDICAL RECORD NO.:  [id number]          PATIENT TYPE:  OUT   LOCATION:  XRAY                         FACILITY:  [facility]   PHYSICIAN:  Corwin Levins, MD      DATE OF BIRTH:  1955-12-13   DATE OF ADMISSION:  07/07/2005  DATE OF DISCHARGE:  07/07/2005                                HISTORY & PHYSICAL   CHIEF COMPLAINT:  Abdominal pain, right side.   HISTORY OF PRESENT ILLNESS:  [name] is a 55 year old white female who  presents for the above.  She has had two weeks of steadily increasing  abdominal pain, primarily right upper quadrant and right abdomen, but  curiously without nausea or vomiting or constipation.  She has a relatively  high pain tolerance and has been putting up with the discomfort.  There has  been no fever, but the pain is now too severe to ignore, and she can barely  sit up straight in the waiting room for more than five minutes.  The pain  overall is questionably worse with eating but clear.  There is a history of  cecal volvulus in 2004 with associated septic shock, and curiously no fever  until the shock began, and that is how the sepsis began.  She has had  history of dysautonomia as well as over ovary cyst with cryo laparoscopy and  abdominal adhesions in the past.   PAST MEDICAL HISTORY:   ILLNESSES AND SURGERIES:  1. Status post cholecystectomy in 2006.  2. Status post appendectomy.  3. Dysautonomia with at least weekly IV fluids and Adderall which seemed      to be working well.  4. Breast cancer status post bilateral mastectomy.  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  1. History of thymoma.  2. History of ovary cyst with prior laparoscopy.   ALLERGIES:  PENICILLIN.   CURRENT MEDICATIONS:  1. Atenolol 25 mg b.i.d.  2. Effexor XR 150 mg p.o. daily.  3. Adderall 20 mg b.i.d.  4. Multivitamin daily.  5. Zofran 8 mg IV b.i.d. per PICC line as well as at least weekly IV  fluids per PICC line at home.   SOCIAL HISTORY:  Married with four children.  Nonsmoker, nondrinker.   FAMILY HISTORY:  Significant for ovary cancer.   REVIEW OF SYSTEMS:  Noncontributory.   PHYSICAL EXAMINATION:  GENERAL:  This is a 55 year old white female, very  nice.  VITAL SIGNS:  Blood pressure 129/73, respirations 20, pulse 94, afebrile.  She declined weight today.  HEENT:  Sclerae are clear.  TM's clear.  Pharynx benign.  NECK:  Without lymphadenopathy or JVD, thyromegaly.  CHEST:  Without rales or wheezes.  CARDIOVASCULAR:  Regular rate and rhythm.  ABDOMEN:  Exquisite tenderness over the right upper quadrant and mid right  abdomen with some mild guarding but no rebound.  Somewhat decreased bowel  sounds, otherwise, benign.  EXTREMITIES:  No edema.   ASSESSMENT/PLAN:  1. Abdominal pain, questionable recurrent volvulus versus obstruction      versus other such as  pancreatic pseudocyst, although, this would be an      entirely new problem for her.  She is to be admitted for pain control      and further evaluation including morphine sulfate, Zofran p.r.n., IV      fluids, n.p.o.  Check routine labs as well as CT of the abdomen and      pelvis.  Consider GI and surgery consultation.  2. Dysautonomia with weekly IV fluids and Adderall.  Will continue as is.  3. Other medical problems, otherwise, continue home medications.           ______________________________  Corwin Levins, MD     JWJ/MEDQ  D:  08/30/2005  T:  08/30/2005  Job:  [id number]

## 2010-05-20 NOTE — Consult Note (Signed)
NAME:  [name], [name]                           ACCOUNT NO.:  [id number]   MEDICAL RECORD NO.:  [id number]                   PATIENT TYPE:  INP   LOCATION:  5709                                 FACILITY:  [facility]   PHYSICIAN:  Sharlet Salina T. Hoxworth, M.D.          DATE OF BIRTH:  October 17, 1955   DATE OF CONSULTATION:  01/01/2003  DATE OF DISCHARGE:                                   CONSULTATION   CHIEF COMPLAINT:  Abdominal pain.   HISTORY OF PRESENT ILLNESS:  Ms. Calais is 55 year old white female well  known to me following an emergency right hemicolectomy in June of this year  for adhesions and obstruction with ischemia. She had done well following  this surgery. She, however, about ten days ago developed the gradual onset  of upper abdominal pain. The pain has been somewhat episodic and waxing and  waning since then, but overall gradually increasing in intensity and  duration. She describes a pressure or cramp-like pain felt in her flanks  bilaterally and bilateral upper quadrant, worse on the right than the left.  She was evaluated as an outpatient at [facility] with essentially negative  workup about five days ago including a gallbladder ultrasound that was  normal and reportedly a urinalysis that was normal as well. Over the last  couple of days her pain became quite severe and she was admitted yesterday  for further treatment and evaluation. The pain seems to be made worse by  activity and made better by resting. She also states the pain has been made  by eating and she had a particularly severe bout a couple of days ago after  eating some bacon. She has not had any nausea and vomiting.  She has had  somewhat loose frequent stools since her colon surgery and these have not  changed. No melena or hematochezia. She has felt hot and flushed along with  the pain, but no definite fever noted. She has no history of any similar  abdominal complaints in the past.   PAST MEDICAL HISTORY:   Surgery--right colectomy in June 2004. She has had  multiple laparotomies for ovarian cysts, fibroids, and eventually abdominal  hysterectomy and salpingo-oophorectomy in 1997.  She has a history of right  breast cancer, status post bilateral mastectomy and reconstruction over 15  years ago.  She has had a repair of Spigelian abdominal hernia. She has a  history of colon polyps. Medical--she has a history of thymoma treated  nonsurgically in 2000. She has a history of arrhythmias and status post  cardiac ablation on three occasions here and at [facility].   MEDICATIONS:  On admission are  Effexor 75 mg q.a.m. for hot flashes and  multivitamins. She was started on Cipro five days ago as an outpatient.   ALLERGIES:  She is allergic to PENICILLIN, CODEINE, and VICODIN.   SOCIAL HISTORY:  She works as a Runner, broadcasting/film/video.  She does not smoke cigarettes. She  drinks rare alcohol.   FAMILY HISTORY:  Noncontributory.   REVIEW OF SYSTEMS:  GENERAL: She has felt weak, flushed, and hot. No  definite chills with this illness.  MUSCULOSKELETAL: No definite joint, back  pain, or history of arthritis or back pain. GU: No urinary burning or  frequency.  PULMONARY:  No shortness of breath, cough, or wheezing.   PHYSICAL EXAMINATION:  VITAL SIGNS: She is afebrile with blood pressure of  120/70, pulse 72, respirations 14.  GENERAL: A well-developed white female in no distress.  SKIN: Warm and dry without rash or infection.  HEENT:  No palpable masses or thyromegaly. Sclerae nonicteric.  LUNGS: Clear to auscultation without increased work of breathing.  CARDIAC: Regular rate and rhythm without murmurs. No JVD.  ABDOMEN: Well-healed midline incision. No hernias. There is mild tenderness  in the right upper quadrant towards the right flank. No guarding. No  palpable masses or hepatosplenomegaly.  EXTREMITIES: No deformities, joint swelling, or edema.  NEUROLOGIC: Alert and oriented. Sensory  exam is grossly normal.   LABORATORY:  ESR is 1. Amylase is 70, lipase 27. Electrolytes, LFTs all  normal. Glucose 127.  White blood count  5.8, hematocrit 42.2. No recent  urinalysis, but this was reportedly negative at [facility] office five days  ago.   X-RAYS:  Abdominal ultrasound on December 29, 2002, was negative.  One-view  abdomen on December 29, 2002, was negative. Reviewed CT scan of her chest,  abdomen, and pelvis. There is a 1.8 cm area of soft tissue in the anterior  mediastinum. A tiny left apical pulmonary nodule.  The abdomen shows no  evidence of obstruction or inflamed bowel. The spleen is mildly enlarged.   ASSESSMENT/PLAN:  Bilateral upper abdominal pain, right greater than left.  Workup so far has been entirely unrevealing. Nothing to suggest bowel  obstruction or complications from her recent gastrointestinal surgery. Her  pain is somewhat suggestive of gallbladder disease, though certainly not  classic for this.  She could have biliary dyskinesia or unrecognized small  stones.  Some aspects suggest musculoskeletal disease, but seems more likely  GI in origin. I am going to go ahead and order a HIDA scan to further  evaluate her gallbladder and ejection fraction. Certainly no evidence of  acute surgical abdomen at this point.                                               Lorne Skeens. Hoxworth, M.D.    Tory Emerald  D:  01/01/2003  T:  01/02/2003  Job:  [id number]

## 2010-05-20 NOTE — Op Note (Signed)
NAME:  [name], [name]                           ACCOUNT NO.:  [id number]   MEDICAL RECORD NO.:  [id number]                   PATIENT TYPE:  INP   LOCATION:  1825                                 FACILITY:  [facility]   PHYSICIAN:  Sharlet Salina T. Hoxworth, M.D.          DATE OF BIRTH:  1955-01-30   DATE OF PROCEDURE:  06/11/2002  DATE OF DISCHARGE:                                 OPERATIVE REPORT   PREOPERATIVE DIAGNOSIS:  Cecal volvulus.   POSTOPERATIVE DIAGNOSIS:  Complete obstruction and infarction of proximal  right colon secondary to adhesive band.   SURGICAL PROCEDURE:  Laparotomy and right hemicolectomy.   SURGEON:  Lorne Skeens. Hoxworth, M.D.   ASSISTANT:  Gabrielle Dare. Janee Morn, M.D.   ANESTHESIA:  General.   BRIEF HISTORY:  [name] is a 55 year old white female who presents with  the acute onset of lower abdominal pain with distention earlier today.  CT  scan has shown a large, thickened, dilated portion of colon, initially  unclear whether it was sigmoid or right colon, consistent with a volvulus.  She has undergone attempted colonoscopic decompression with findings of a  twisting and obstruction of the mid-right colon which could not be  decompressed.  An urgent laparotomy and probable bowel resection has been  recommended and accepted.  The nature of the procedure, its indications,  risks of bleeding, infection, and possible need for ostomy were discussed  and understood with the patient and family preoperatively.  She is now  brought to the operating room for this procedure.   DESCRIPTION OF OPERATION:  The patient was brought to the operating room and  placed in the supine position and general endotracheal anesthesia was  induced.  She was given broad-spectrum preoperative antibiotics.  A Foley  catheter was placed.  The abdomen was sterilely prepped and draped.  A  midline incision in the lower abdomen extending up to just above the  umbilicus was used and dissection  carried down through the subcutaneous  tissue and the midline fascia and the peritoneum entered under direct  vision.  There was a large amount of thin, bloody fluid in the abdomen that  was suctioned.  The cecum was exposed and was markedly dilated and  hemorrhagic and thickened with evidence of early necrosis.  Initially this  appeared to be a volvulus but it could not be untwisted and on inspection,  there was seen to be a very dense adhesive band running from the  rectosigmoid down to the pelvis from previous hysterectomy that had created  a loop under which the terminal ileum and approximately half of the  ascending colon had become trapped.  The band was divided between clamps and  released, and the colon was able to be brought up and untwisted.  The colon  was quite redundant throughout its course and was otherwise normal.  The  small bowel was normal right up to the terminal ileum.  The distal colon had  been fairly well-decompressed by the colonoscope and there was not really  any distention of the small bowel particularly, and we elected to perform a  resection and primary anastomosis.  Points of proximal and distal resection  at the terminal ileum and proximal transverse colon were chosen.  These  areas were cleaned of omentum and mesentery.  The mesentery of the involved  segment after mobilizing the right colon was then sequentially divided  between clamps and tied with 2-0 silk ties and the larger vessels suture  ligated.  Following this a functional end-to-end anastomosis was created  between the terminal ileum and the transverse colon with a single firing of  the GIA 75 mm stapler.  The common enterotomy was closed and the specimen  removed with a firing of the TA-60 stapler.  The anastomosis appeared widely  patent with a good blood supply and under no tension.  All gloves and  instruments were changed at this point.  The mesenteric defect was closed  with interrupted 3-0  silk.  The remainder of the abdominal exploration was  unremarkable with no other significant adhesions.  The abdomen was copiously  irrigated with several liters of warm saline.  The viscera were returned to  their anatomic position.  The midline fascia was closed with running #1 PDS  begun at either end of the incision and tied centrally.  The subcutaneous  tissue was irrigated with kanamycin solution and the skin closed with  staples.  The sponge, needle, and instrument counts were correct.  Dry  sterile dressings were applied and the patient taken to recovery in good  condition.                                               Lorne Skeens. Hoxworth, M.D.    Tory Emerald  D:  06/11/2002  T:  06/11/2002  Job:  [id number]

## 2010-05-20 NOTE — Discharge Summary (Signed)
NAME:  [name], [name] NO.:  [id number]   MEDICAL RECORD NO.:  [id number]          PATIENT TYPE:  INP   LOCATION:  5736                         FACILITY:  [facility]   PHYSICIAN:  Corwin Levins, MD      DATE OF BIRTH:  08-08-1955   DATE OF ADMISSION:  08/30/2005  DATE OF DISCHARGE:  09/02/2005                                 DISCHARGE SUMMARY   DISCHARGE DIAGNOSES:  1. Right-sided abdominal pain, nausea and diarrhea, questionable etiology.  2. Chronic diarrhea with dysautonomia.  3. History of breast cancer.  4. History of thyroid disease.  5. Depression/anxiety.  6. Questionable UTI this admission.   CONSULTS:  1. Gabrielle Dare. Janee Morn, August 31, 2005.  2. Gastroenterology [facility].   PROCEDURES:  1. Abdominal/pelvic CT scan with normal findings.  2. __________ negative.   HISTORY AND PHYSICAL:  Seen and dictated by myself, date of admission August 30, 2005.   HOSPITAL COURSE:  [name] is a very nice 55 year old white female who  presents with nausea and vomiting and history of right-sided discomfort,  with a history of dysautonomia and probable functional bowel problems in the  past.  CT of the abdomen and pelvis proved negative.  She was treated with  IV fluid, pain medications p.r.n. and empiric IV Cipro.  She was seen for  General Surgery and Gastroenterology, but unfortunately, no significant  improvement in her discomfort was able to be improved overall, except with  p.r.n. pain medication.  There was some occasional headache through her  hospitalization as well.  No other significant problems noted during this  hospitalization.  She remained afebrile.  Vital signs stable, and she was  eating, ambulatory, vital signs stable, afebrile.  Workup negative, although  we were not able to improve her discomfort, it was felt she had gained  maximum benefit from this hospitalization and was discharged home.  It is  also noted that her initial  admission urine culture was positive for  __________ .   DISPOSITION:  Discharged to home in stable condition.  No activity or  dietary restriction.  She is to follow up with Dr. Leone Payor in 1-2 weeks for  reconsideration of [facility] functional valve __________  of referral.   DISCHARGE MEDICATIONS:  1. __________  mg q.i.d. for 7 days.  2. Tylenol #3 one q.i.d. p.r.n.  3. All other home medications including:  4. Atenolol 25 mg p.o. b.i.d.  5. Effexor XR 50 mg p.o. every day.  6. Adderall 20 mg b.i.d.  7. Multivitamin daily.  8. Zofran 8 mg IV b.i.d. through PICC line.  9. Weekly IV fluids will be provided at home.           ______________________________  Corwin Levins, MD     JWJ/MEDQ  D:  09/02/2005  T:  09/02/2005  Job:  [id number]

## 2010-05-20 NOTE — Discharge Summary (Signed)
NAME:  [name], [name]                 ACCOUNT NO.:  [id number]   MEDICAL RECORD NO.:  [id number]          PATIENT TYPE:  INP   LOCATION:  6727                         FACILITY:  [facility]   PHYSICIAN:  Valerie A. Felicity Coyer, MDDATE OF BIRTH:  [birth year]   DATE OF ADMISSION:  11/02/2005  DATE OF DISCHARGE:  11/06/2005                               DISCHARGE SUMMARY   DISCHARGE DIAGNOSES:  1. Bright red blood per rectum secondary to bleeding hemorrhoids.  2. History of dysautonomia.   HISTORY OF PRESENT ILLNESS:  [name] is a 55 year old female who was  admitted on November 02, 2005 with the chief complaint of bright red  blood per rectum and chronic nausea.  Her hemoglobin was noted to be  13.2 on admission and she was admitted for further evaluation and GI  evaluation.   PAST MEDICAL HISTORY:  1. A history of chronic nausea.  2. A history of cecal volvulus and septic shock, 2004.  3. A history of dysautonomia.  4. A history of thymoma.  5. A history of breast cancer status post bilateral mastectomy.  6. A history of anxiety/depression.   COURSE OF HOSPITALIZATION:  Problem:  1. Bright red blood per rectum.  The patient was admitted and was      evaluated by [facility], Dr. Marina Goodell.  CT was performed which was      negative and it was felt that her bleeding was likely secondary to      hemorrhoids which she had both internally and externally.  A      surgery consult was obtained during this admission and the patient      was seen by Dr. Derrell Lolling.  It was decided that the patient would be      discharged to home and to follow up with Dr. Claud Kelp in the      office at which time they would consider injection therapy after      performing a better exam with an anoscope in the office.  It is      hoped that they would be able to avoid internal and external      hemorrhoidectomy.   MEDICATIONS AT DISCHARGE:  Anusol cream 3x daily.  She is instructed to  continue other medications as  prior to admission and to avoid aspirin,  ibuprofen, or other anti-inflammatory pain medicines.   PERTINENT LABORATORIES AT TIME OF DISCHARGE:  The patient's hemoglobin  is 12, hematocrit 34.3.   DISPOSITION:  Plan to transfer patient to home.  She is to follow up  with Dr. Derrell Lolling in 2 weeks and Dr. Jonny Ruiz as needed.      Sandford Craze, NP      Raenette Rover. Felicity Coyer, MD  Electronically Signed    MO/MEDQ  D:  11/28/2005  T:  11/29/2005  Job:  [id number]   cc:   Corwin Levins, MD

## 2010-05-20 NOTE — Consult Note (Signed)
NAME:  [name], [name]                 ACCOUNT NO.:  [id number]   MEDICAL RECORD NO.:  [id number]          PATIENT TYPE:  INP   LOCATION:  5736                         FACILITY:  [facility]   PHYSICIAN:  Gabrielle Dare. Janee Morn, M.D.DATE OF BIRTH:  30-Aug-1955   DATE OF CONSULTATION:  08/31/2005  DATE OF DISCHARGE:                                   CONSULTATION   CHIEF COMPLAINT:  Right-sided abdominal pain, nausea, and diarrhea.   PRIMARY CARE PHYSICIAN:  Corwin Levins, M.D.   ADMITTING PHYSICIAN:  [place] hospitalist.   HISTORY OF PRESENT ILLNESS:  The patient is a 55 year old female with a  history of multiple medical issues including dysautonomia, chronic diarrhea,  and multiple GI complaints who presents with right-sided abdominal pain and  diarrhea of 3 weeks duration.  She is also complaining of nausea since she  has been admitted to the hospital.  She is known to my practice from  undergoing a right colectomy for a cecal volvulus; and also a  cholecystectomy in the past by Dr. Johna Sheriff.  She claims this pain is crampy  in nature; and is mostly localized down into the right lower quadrant.  After admission laboratory studies and CT scan of the abdomen and pelvis  have all appeared normal, we were asked to see her for a surgical  evaluation.   REVIEW OF SYSTEMS:  NEURO:  Multiple complaints from her dysautonomia and  chronic pain.  CARDIOVASCULAR:  Negative.  PULMONARY:  Negative.  GI:  Please see above.  GU:  Negative.  MUSCULOSKELETAL:  Negative acute, though  she does have chronic scattered pain.   PAST MEDICAL HISTORY:  1. Chronic diarrhea and GI complaints.  She is seen by Dr. Leone Payor and was      also referred to [facility] in the past.  2. Breast cancer.  3. Dysautonomia  4. Thyroid disease.  5. Depression and anxiety.   PAST SURGICAL HISTORY:  Includes laparoscopic cholecystectomy, appendectomy,  bilateral mastectomy, right hemicolectomy for cecal volvulus, hysterectomy,  ovarian  cyst surgery, spigelian hernia repair.   SOCIAL HISTORY:  Negative.   PHYSICAL EXAM:  VITAL SIGNS:  Temperature 97.7, blood pressure 116/79, heart  rate 79, respirations 20.  GENERAL:  She is alert, mildly anxious and quite traumatic describing her  symptoms.  NEUROLOGIC EXAM:  She is moving all extremities well.  She is oriented and  alert.  HEENT:  Oral mucosa is moist.  HEART:  Regular with no murmurs, impulses palpable on the left chest.  LUNGS:  Clear to auscultation.  Respiratory excursion is normal.  ABDOMEN:  Soft and nondistended.  She has some right-sided epigastric  tenderness with no guarding, no hernias are palpated.  No discrete masses  are noted.  Bowel sounds are present.  EXTREMITIES:  Warm with no significant edema.   LABORATORY STUDIES:  Basic metabolic panel is within normal limits with the  exception of glucose of 128.  Liver function tests are normal.  White blood  cell count 4.4, hemoglobin 13.1.  Urinalysis is negative.   IMPRESSION:  1. Abdominal pain, nausea, and diarrhea  of unclear etiology.  2. Current CT scan demonstrates no obstruction or acute findings contrast      is passing through into her colon where some moderate stool remains  3. Long history of multiple gastrointestinal complaints.  4. No need for emergent surgery at this time.   RECOMMEND THE FOLLOWING:  Continuing IV fluids and symptomatic treatment.  We also recommend GI evaluation.  She is known to Dr. Leone Payor and we will  follow along with you.   PLAN:  We discussed, in detail, with the patient and her mother.      Gabrielle Dare Janee Morn, M.D.  Electronically Signed     BET/MEDQ  D:  08/31/2005  T:  09/01/2005  Job:  [id number]   cc:   Corwin Levins, MD  [name], MD,FACG

## 2010-05-20 NOTE — Op Note (Signed)
NAME:  [name], [name]                           ACCOUNT NO.:  [id number]   MEDICAL RECORD NO.:  [id number]                   PATIENT TYPE:  OIB   LOCATION:  2853                                 FACILITY:  [facility]   PHYSICIAN:  Duke Salvia, M.D.               DATE OF BIRTH:  [birth year]   DATE OF PROCEDURE:  06/18/2003  DATE OF DISCHARGE:  06/18/2003                                 OPERATIVE REPORT   PREOPERATIVE DIAGNOSES:  Syncope and dysautonomic symptoms.   POSTOPERATIVE DIAGNOSIS:  Normal tilt table response.   Following the obtaining of informed consent, the patient was subjected to  orthostatic progressive tilt.  She spent five minutes at 15 degrees, at 30  degrees, 45 degrees, and then 30 minutes at 70 degrees.  Initial blood  pressures were 140 or so over 80 with a pulse of 80.  At 15 degrees there  was a modest decrease in her blood pressure to the 130 range with a pulse of  70.  At 30 degree the blood pressure remained in the 130 range with a pulse  of 75.  At the 45 degree level the blood pressure remained in the same range  with similar heart rates, and at 70 degree again there was no significant  change.  The patient was returned to the supine position.  Isoproterenol was  initiated with an incremental increase in the heart rate.  The patient was  then tilted up without significant change in heart rate or blood pressure,  although there was nausea and a sensation of a hot feeling.   IMPRESSION:  Normal tilt table test.                                               Duke Salvia, M.D.    SCK/MEDQ  D:  06/18/2003  T:  06/19/2003  Job:  [id number]

## 2010-05-20 NOTE — H&P (Signed)
NAME:  [name], [name]                 ACCOUNT NO.:  [id number]   MEDICAL RECORD NO.:  [id number]          PATIENT TYPE:  INP   LOCATION:  6727                         FACILITY:  [facility]   PHYSICIAN:  Sean A. Everardo All, MD    DATE OF BIRTH:  04/18/1955   DATE OF ADMISSION:  11/02/2005  DATE OF DISCHARGE:                                HISTORY & PHYSICAL   REASON FOR ADMISSION:  Rectal bleeding.   HISTORY OF PRESENT ILLNESS:  [age]-year-old woman with several weeks of  intermittent diarrhea which is bloody.  She states she had more rectal  bleeding in the past day or so, but is unable to quantify this.  A friend  associated abdominal pain which is generally right-sided.   MEDICATIONS:  Zofran 8 mg twice daily as needed for nausea, Tenormin 25 mg  twice a day, Effexor XR 150 mg daily, Adderall 20 mg twice a day.   PAST MEDICAL HISTORY:  1. Chronic autonomic insufficiency.  2. She had a cecectomy and ascending colectomy three years ago, apparently      for ischemic colitis.   SOCIAL HISTORY:  She is married and her mother is at the bedside.   FAMILY HISTORY:  Negative for the above.   REVIEW OF SYSTEMS:  Denies the following:  Fever, skin rash, chest pain,  shortness of breath, dysuria, headache, visual loss, weight loss, hematuria,  easy bruising, sore throat, and earache.  She has no change in her chronic  nausea.  She has a question of fever, but she is uncertain about this.   PHYSICAL EXAMINATION:  VITAL SIGNS:  Blood pressure 154/86.  Heart rate 86.  Respiratory rate 20.  Temperature is 97.6 degrees.  GENERAL:  Healthy-appearing woman.  Supine.  SKIN:  Not diaphoretic.  I do not see a rash.  HEENT:  No proptosis.  No periorbital swelling.  Pharynx is normal.  NECK:  Supple.  No goiter.  CHEST:  Clear to auscultation.  CARDIOVASCULAR:  There is no JVD.  No edema.  Regular rate and rhythm.  No  murmur.  PULSES:  Pedal pulses are intact.  ABDOMEN:  Soft, not distended.  There is  slight right-sided tenderness.  No  hepatosplenomegaly, no mass.  RECTAL EXAMINATION:  Per Emergency Department, small amount of bright red  blood.  EXTREMITIES:  No deformities seen.  NEUROLOGICAL:  Alert and oriented.  Does not appear depressed.  Sensation is  decreased to touch at her feet.   LABORATORY STUDIES:  CBC and BMET are normal.  Pro time is normal.   IMPRESSION:  1. Rectal bleeding uncertain etiology.  2. Chronic autonomic insufficiency.  3. Chronic nausea due to #2.   PLAN:  1. Symptomatic therapy.  2. Intravenous fluids.  3. Consult GI.  4. Recheck CBC in the morning.  5. The patient is uncertain about her outpatient medication dosages so I      prescribed them based on her best recollection.  6. Clear-liquid diet.  7. I discussed code status with patient and she requested be a complete      DNR.  ______________________________  Cleophas Dunker Everardo All, MD     SAE/MEDQ  D:  11/02/2005  T:  11/03/2005  Job:  [id number]   cc:   Clabe Seal. Meryl Crutch, M.D.

## 2010-05-20 NOTE — Discharge Summary (Signed)
NAME:  [name], [name]                           ACCOUNT NO.:  [id number]   MEDICAL RECORD NO.:  [id number]                   PATIENT TYPE:  INP   LOCATION:  5709                                 FACILITY:  [facility]   PHYSICIAN:  [name], M.D. [facility]           DATE OF BIRTH:  1955/12/14   DATE OF ADMISSION:  12/31/2002  DATE OF DISCHARGE:  01/02/2003                                 DISCHARGE SUMMARY   ADMITTING DIAGNOSIS:  1. Upper abdominal and back pain, rule out musculoskeletal source, rule out     neuropathy from yet-to-be manifested shingles, rule out     gastrointestinal/biliary etiology.  2. Status post exploratory laparotomy with right hemicolectomy owing to     ischemic obstruction of the right colon caused by adhesions.  Surgery     performed June of 2004.  3. History of right-sided breast cancer status post bilateral mastectomies     and breast reconstruction in the 1980s, status post chemotherapy.  4. Status post cesarean section.  5. Status post repair of an abdominal hernia in 1976.  6. Status post abdominal hysterectomy and oophorectomy in 1997.  7. History of multiple ovarian cysts and possibly fibroid tumors as well for     which she is status post multiple laparotomies, perhaps as many as 7 or 8     times.  These procedures performed in [place] prior to her living in     [place].  An exploratory lap was performed in 2004 in [place].  8. Status post repair of abdominal spigelian hernia in 1976.  9. History of colon polyps, 1980s.  10.      History of thymoma in 2000 which was treated with herbal therapy.  11.      Status post cardiac ablations of arrhythmia once in [facility] by     Dr. Graciela Husbands and twice at [facility] in about 1996 or 1997.   ALLERGIES:  PENICILLIN, CODEINE, VICODIN.   DISCHARGE DIAGNOSES:  1. Upper abdominal pain of undetermined etiology following extensive imaging     tests as well as chemistries and blood work.  2. Mediastinal and left  apical nodule, seemingly a new findings; needs to     have outpatient followup.   BRIEF HISTORY:  [name] is a 55 year old white female with above-  mentioned medical history.  She was admitted to the hospital by Dr. Juanda Chance  because of severe bilateral flank and abdominal pain which had been present  for about five days.  This was accompanied by nausea but no fever.  She was  having bowel movements.  She had ischemic colon resulting from intra-  abdominal adhesions and had developed a cecal volvulus in June of 2004 which  required this above-mentioned surgery.  Since that time, her GI status has  never returned to completely normal.  She tends to have abdominal pain,  altered bowel habits.  In the office  Dr. Juanda Chance had gotten an ultrasound  which was negative.  Labs, including LFTs, were normal.  She had no response  to twice-daily Cipro, and she was admitted for further management.   CONSULTATIONS:  Sharlet Salina T. Hoxworth, M.D.   PROCEDURE:  None.   LABS:  Hemoglobin 14.4, hematocrit 42.2.  MCV 79.9.  Platelets 281,000.  White blood cell count 5.8.  ESR/sed rate 1.  Sodium 141, potassium 4.4,  chloride 105, CO2 30.  Glucose 127, BUN 9, creatinine 0.9.  Total bilirubin,  alkaline phosphatase, AST, ALT and albumin all within normal limits.  Amylase and lipase within normal limits.  Urinalysis unremarkable, and urine  cultures grew out only 6000 colonies, representing insignificant growth.   HOSPITAL COURSE:  Patient was admitted to the hospital and started on  medications for pain management.  These included Demerol and Phenergan.  Her  oral outpatient medications were continued.  She was started on oral  Protonix.   During her hospitalization, the patient underwent further testing with CT  scan of the abdomen and pelvis which showed a 5-mm nodule in the left lung  apices and a 1.8 x 1-cm soft tissue lesion in the anterior mediastinum.  Also noted was a stable 5-mm lesion in the  right liver and stable mild  splenomegaly.  Hepatobiliary scan of the gallbladder was normal with normal  gallbladder ejection fraction.  Abdominal ultrasound was unremarkable.  Common bile duct was 4 mm, and there was no evidence of any biliary ductal  dilatation.   Following multiple imaging studies, no source for the patient's abdominal  pain could be found.  Blood tests were equally unhelpful.  Within 24 hours  the patient was feeling a lot better.  Diet was advanced from liquids to  solids.  She continued to do well and was discharged to home on hospital day  3 following completion of all tests.  One element that was left incompletely  resolved was the abnormal chest CT findings, and the plan was to follow up  this as an outpatient, probably with a followup CT scan.   MEDICATIONS AT DISCHARGE:  1. Effexor XR 75 mg daily.  2. Protonix 40 mg daily.  3. Phenergan 25 mg 1 every 8-12 hours p.r.n.  4. Percocet 5/500, 1-2 q.4h. p.r.n. pain.   DIET:  She was advised to avoid spicy, fried and greasy foods and avoid  alcohol.   She was to follow up with Dr. Juanda Chance in the office in 2-4 weeks, and the  patient had planned to make the appointment herself.   CONDITION AT DISCHARGE:  Stable and improved.      Jennye Moccasin, P.A. [facility]                   [name], M.D. [facility]    SG/MEDQ  D:  03/18/2003  T:  03/20/2003  Job:  [id number]   cc:   Lorne Skeens. Hoxworth, M.D.  [address]  [place]  [place] [address]  Fax: [phone]   Lina Sar, M.D. [facility]

## 2010-05-20 NOTE — H&P (Signed)
NAME:  [name], [name]                 ACCOUNT NO.:  [id number]   MEDICAL RECORD NO.:  [id number]          PATIENT TYPE:  INP   LOCATION:  0346                         FACILITY:  [facility]   PHYSICIAN:  [name], M.D. LHCDATE OF BIRTH:  1955-10-27   DATE OF ADMISSION:  02/01/2004  DATE OF DISCHARGE:                                HISTORY & PHYSICAL   CHIEF COMPLAINT:  Right-sided abdominal pain.   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 Johna Sheriff).  She has  been having some recurrent problems with right-sided abdominal pain and  diarrhea since then.  She has been admitted to the hospital in December of  2004, and had evaluations in March of 2005, and August and September of 2005  that had been unrevealing.  She has now had 10 days of right mid to lower  quadrant abdominal pain that is a constant burning, improved by lying flat.  She has persistent diarrhea without change.  She has not had bleeding or  fever or vomiting, though she has had nausea.  She has been on Vicodin  without benefit.  CT of the abdomen and pelvis performed on January 29, 2004  was unrevealing.  It showed a stable hepatic cyst and hemangioma.  She  denies urinary symptoms.  She went to the emergency department yesterday  where a CMET, CBC, urinalysis, and lipase were unremarkable, except for a  slightly elevated lipase at 57.  She was given ketorolac without significant  benefit.  Abdominal series was unrevealing.  She saw Dr. Jonny Ruiz today, and he  asked me to see her as work in.  She denies any other significant ongoing  problems right now as far as her review of systems is concerned.  There is  no dysuria.  She has not been ill otherwise, though she has these recurrent  problems.  A colonoscopy in September of 2005 was unremarkable.  In 2004, a  HIDA scan with an ejection fraction and abdominal ultrasound were  unremarkable, and she has had several CTs of the chest, abdomen, and pelvis  that have not revealed any significant findings related to this pain.  She  has not lost weight over time; in fact, as times she has gained, despite  symptoms.  She has been unable to work because she has felt bad and had  problems.   CURRENT MEDICATIONS:  1.  Effexor 150 mg daily.  2.  Midodrine 30 mg t.i.d.  3.  Atenolol 25 mg daily.  4.  Mestinon 30 mg t.i.d.  5.  Tylenol p.r.n.  6.  Excedrin p.r.n.  7.  Vicodin p.r.n.  8.  Zofran is used as needed.   ALLERGIES:  She has allergies to PENICILLIN, which has caused anaphylactic  shock.   SOCIAL HISTORY:  The patient is married.  She has 4 children.   PAST MEDICAL HISTORY:  1.  As above.  2.  Prior breast cancer on the right with bilateral mastectomies,  prophylactic, and chemotherapy.  3.  Cardiac ablations in the past.  4.  Status post total abdominal hysterectomy and salpingo-oophorectomy.  5.  Multiple ovarian cysts and procedures and procedures related to that      years ago.  6.  Spigelian hernia repair.  7.  Apparent history of thymoma.  8.  Colon polyps in the past.  9.  Dysautonomia with palpitations and syncope.  Recent tilt table testing      negative.   SOCIAL HISTORY:  The patient is married.  She has 4 children.  She has been  a third grade teacher.  She is not working currently.  She does not smoke.  Rare alcohol.   REVIEW OF SYSTEMS:  Positive for fatigue.  No urinary problems.  No muscle  aches, fevers, or chills.  She has had some syncope problems in the past.   PHYSICAL EXAMINATION:  GENERAL:  A well-developed, well-nourished white  woman in no acute distress.  VITAL SIGNS:  She is afebrile in the primary care office.  Pulse 80, blood  pressure 120/60.  Eyes - anicteric.  NECK:  Supple.  CHEST:  Clear.  BACK:  Moderate right-sided CVA tenderness.  HEART:  S1 and S2.  No rubs, murmurs, or gallops.  ABDOMEN:  Soft.  She  is tender in the right lower quadrant area; tender to  percussion, as well.  Bowel sounds are present.  Slightly decreased.  Slightly high-pitched.  There is no obvious rebound or true peritoneal  findings.  EXTREMITIES:  Free of edema.  SKIN:  Areas inspected of the skin are free of rash.  NEUROLOGIC:  She appears alert and oriented x3.   ASSESSMENT:  Recurrent right lower quadrant pain in a woman with completed  medical history that includes previous right hemicolectomy for cecal  volvulus.  She has dysautonomia.  She has prior history of multiple  abdominal surgeries or pelvic surgeries for ovarian cysts, as well as  hysterectomy.  She could have adhesions.  She could have another internal  hernia, which she has had in the past.  This could be functional or  musculoskeletal.  It is the same sort of problem that she has had since her  previous surgery, and it has defied obvious explanation.  She is not  tolerating outpatient therapy at this point.  While there are no worrisome  features from her laboratory and radiologic investigation, she does have  considerable pain that is unexplained completely at this point, and I think  admission for observation is warranted.   PLAN:  1.  CBC, CMET, sedimentation rate, lipase, amylase, and urinalysis again.  2.  Abdominal series.  3.  Surgical consultation.  4.  Pending surgical consultation and their thoughts, consider CT      angiography, consider small bowel series.  Depending upon the clinical      course, a laparoscopy or laparotomy could be indicated but I do not      think that is the case at this point.   ADDENDUM:  It is possible she could be having some increasing abdominal pain  from her Mestinon.  It is a cholinergic agent, and it can do that.  Her  diarrhea seems to be relatively stable since her hemicolectomy, and I  would at least attribute it to that plus/minus some effect of the dysautonomia.  Having said that the Mestinon  might be contributing, the pain  seems very similar to the problem she had before she ever went on  that in  the fall of 2005.   I appreciate the opportunity to care for this patient.      CEG/MEDQ  D:  02/01/2004  T:  02/01/2004  Job:  [id number]   cc:   Corwin Levins, M.D. [facility]   Lina Sar, M.D. [facility] T. Hoxworth, M.D.  [address], [address]  [place]  [place] [address]   Duke Salvia, M.D.

## 2010-05-20 NOTE — Discharge Summary (Signed)
NAME:  [name], [name]                 ACCOUNT NO.:  [id number]   MEDICAL RECORD NO.:  [id number]          PATIENT TYPE:  INP   LOCATION:  0346                         FACILITY:  [facility]   PHYSICIAN:  Sharlet Salina T. Hoxworth, M.D.DATE OF BIRTH:  08-14-1955   DATE OF ADMISSION:  02/01/2004  DATE OF DISCHARGE:  02/04/2004                                 DISCHARGE SUMMARY   FINAL DIAGNOSIS:  Biliary dyskinesia.   OPERATION/PROCEDURES:  Laparoscopic cholecystectomy with intraoperative  cholangiogram by Dr. Johna Sheriff on February 03, 2004.   HISTORY OF PRESENT ILLNESS:  [name] is a 55 year old female who  presents with six months of recurrent episodic right upper quadrant and  right mid abdominal pain.  This has become more frequent and severe and is  hospitalized at this time with another episode more severe than previous.  She has had some decreased appetite but no nausea or vomiting.  No  particular exacerbating or relieving factors.  No real dietary component.  She is hospitalized now for further workup and treatment.   PAST MEDICAL HISTORY:  Surgery includes emergency right hemicolectomy by me  in 2004 for cecal volvulus.  She has a remote history of breast cancer with  bilateral mastectomy and reconstruction with postoperative chemotherapy.  She has had multiple GYN procedures over the years for ovarian cysts and has  had a spigelian hernia repair.  Medically, she is treated for dysautonomia  with history of syncope and palpitations, SVT, status post cardiac ablation,  and a history of remote thymoma.   MEDICATIONS ON ADMISSION:  1.  Effexor 150 mg daily.  2.  Midodrine 30 mg t.i.d.  3.  Atenolol 25 mg daily.  4.  Mestinon 30 mg t.i.d.  5.  Vicodin p.r.n.   ALLERGIES:  PENICILLIN.   SOCIAL HISTORY:  See the detailed H&P.   FAMILY HISTORY:  See the detailed H&P.   REVIEW OF SYSTEMS:  See the detailed H&P.   PHYSICAL EXAMINATION:  GENERAL:  A well-developed, healthy-appearing  white  female.  ABDOMEN:  Healed midline incision without hernia.  Moderate-to-marked  epigastric and right upper quadrant tenderness with voluntary guarding.  No  masses or organomegaly.   LABORATORY:  Included CBC, CMET, sed rate, lipase, amylase and urinalysis  all normal.   CT scan of the abdomen and pelvis normal.   Previous ultrasound of the abdomen had been negative.   HOSPITAL COURSE:  Patient was admitted and treated symptomatically.  It was  felt after consultation with surgery and GI that the patient very likely had  biliary dyskinesia with recurrent worsening episodic right upper quadrant  abdominal pain and negative workup.  We discussed with the patient options  and elected to proceed with laparoscopic cholecystectomy in an attempt to  relieve her pain.  This procedure was performed on February 03, 2004 without  incident or complication.  The patient had essentially immediate relief of  her pain postoperatively and by the following day had only mild soreness and  felt much improved.  Abdomen was benign.  She is discharged home at this  time.  Discharge medications are the same as admission.   FOLLOW UP:  In my office in 1-2 weeks.      BTH/MEDQ  D:  03/08/2004  T:  03/08/2004  Job:  [id number]   cc:   [name], M.D. [facility]

## 2010-05-20 NOTE — Op Note (Signed)
NAME:  [name], [name]                 ACCOUNT NO.:  [id number]   MEDICAL RECORD NO.:  [id number]          PATIENT TYPE:  INP   LOCATION:  0346                         FACILITY:  [facility]   PHYSICIAN:  Sharlet Salina T. Hoxworth, M.D.DATE OF BIRTH:  [birth year]   DATE OF PROCEDURE:  02/03/2004  DATE OF DISCHARGE:                                 OPERATIVE REPORT   PREOPERATIVE DIAGNOSIS:  1.  Right upper quadrant pain.  2.  Biliary dyskinesia.  3.  Intestinal adhesions.   POSTOPERATIVE DIAGNOSIS:  1.  Right upper quadrant pain.  2.  Biliary dyskinesia.  3.  Intestinal adhesions.   SURGICAL PROCEDURE:  Laparoscopic cholecystectomy with intraoperative  cholangiogram and lysis of adhesions.   SURGEON:  Lorne Skeens. Hoxworth, M.D.   ASSISTANT:  Anselm Pancoast. Zachery Dakins, M.D.   ANESTHESIA:  General.   BRIEF HISTORY:  [name] is a 55 year old female who is approximately two  years following emergency right hemicolectomy for cecal volvulus with early  gangrenous changes of the right colon.  In the past year, she has had  repeated worsening episodes of right upper quadrant abdominal pain.  These  have required hospitalizations on several occasions, and she has had a very  extensive, detailed workup, all negative for clear source for pain.  She was  recently hospitalized with another severe episode.  Her pain is consistent  with possibly biliary dyskinesia or intestinal adhesions, due to worsening  episodes and patient essentially unable to function due to these.  We have  elected to proceed with surgical intervention in an attempt to relieve her  pain.  We have recommended laparoscopic cholecystectomy with cholangiogram  for symptoms entirely consistent with biliary dyskinesia and also will lysis  any adhesions from her previous colectomy, also as a potential source for  pain.   The nature of the procedure, indications, risks of bleeding, infection, bile  leak, bile duct injury, bowel injury,  and possible need for open procedure  were discussed and understood.  She is now brought to the operating room for  this procedure.   DESCRIPTION OF PROCEDURE:  Patient is brought to the operating room and  placed in a supine position on the operating room table.  General  endotracheal anesthesia was induced.  She received preoperative broad-  spectrum antibiotics.  A Foley catheter was placed.  An orogastric tube was  placed.  The abdomen was widely and sterilely prepped and draped.  Access  was obtained with an 11 mm Optiview trocar in the left upper quadrant  without difficulty.  The laparoscopy revealed remarkable paucity of  adhesions with none to the anterior abdominal wall.  Under direct vision,  two 5 mm trocars were placed along the right subcostal margin.  The bowel  was then carefully examined.  The left colon and transverse colon appeared  normal.  The anastomosis was identified and appeared normal.  A portion of  the distal small bowel, approximately 30-40 cm proximal to the ileocolic  anastomosis was involved with a single dense adhesion along the  antimesenteric border of the small bowel up over the  transverse colon to the  omentum.  This seem to fairly tightly tether the bowel at this point.  This  adhesion was completely taken down.  There were a few other filmy adhesions  in the area, but the rest of the small bowel was run and was completely free  of adhesions and appeared normal.  This one area of dense adhesion, though,  was certainly felt to be a potential cause of pain.  Attention was then  turned to the gallbladder.  Also noted were fairly extensive omental  adhesions up to the gallbladder and some adhesion of the distal stomach and  duodenum with filmy attachments as well.  This was felt to be somewhat  abnormal.  These were carefully taken down with sharp and cautery  dissection, the infundibulum exposed, and retracted inferolaterally.  Further fibrofatty tissue  was stripped off the neck of the gallbladder  towards the porta hepatis.  Calot's triangle was thoroughly dissected.  The  cystic artery was identified at Calot's triangle, coursing up the  gallbladder wall and was divided.  The distal gallbladder was dissected and  the cystic duct dissected out, and the cystic duct/gallbladder junction  identified at 360 degrees.  When the anatomy was cleared, the cystic duct  was clipped at the gallbladder junction.  Operative cholangiogram was taken  through the cystic duct.  This showed good filling of normal common bile  duct and intrahepatic ducts with free flow into the duodenum and no filling  defects.  Following this, the cholangiocatheter was removed, and the cystic  duct was triply clipped proximally and divided.  The gallbladder is then  dissected free from its bed using hook cautery and removed intact through  the 11 mm trocar site that had been placed just below the umbilicus for  camera placement.  The right upper quadrant was irrigated, and complete  hemostasis assured.  The trocar was removed under direct vision.  All CO2  evacuated.  Skin incisions were closed with interrupted subcuticular, 4-0  Monocryl, and Steri-Strips.  Sponge, needle, and instrument counts were  correct.  Dry sterile dressings were applied.  Patient was taken to the  recovery room in good condition.      BTH/MEDQ  D:  02/03/2004  T:  02/03/2004  Job:  [id number]   cc:   [name], M.D. [facility] T. Russella Dar, M.D. [facility]

## 2010-05-20 NOTE — H&P (Signed)
NAME:  [name], [name]                           ACCOUNT NO.:  [id number]   MEDICAL RECORD NO.:  [id number]                   PATIENT TYPE:  INP   LOCATION:  5737                                 FACILITY:  [facility]   PHYSICIAN:  Lina Sar, M.D. [facility]               DATE OF BIRTH:  June 15, 1955   DATE OF ADMISSION:  06/10/2002  DATE OF DISCHARGE:                                HISTORY & PHYSICAL   CHIEF COMPLAINT:  A five-day history of progressive abdominal pain.   HISTORY OF PRESENT ILLNESS:  [name] is a healthy 55 year old white female  with history of breast cancer in the 2s.  Over the last couple of months  she has been having problems with diarrhea.  Five days prior to the  admission she developed discomfort in her lower abdomen that progressed to  severe pain on the day of admission.  On the day of admission she developed  nausea and vomiting, but denies any feculent or bloody emesis.  She was  unable to have a bowel movement for approximately 18-24 hours prior to  admission.  She felt so bad that she called 911 and came to the emergency  room.  She has had multiple abdominal and pelvic surgeries in addition to  her bilateral mastectomies and these are listed below.   Dr. Lina Sar was on call for the [facility].  A KUB showed a  normal appearance to the small bowel.  CT scan and  Gastrografin enema  revealed a deep-like obstruction in the sigmoid colon which looked like a  sigmoid volvulus.  Ultimately she was seen by Dr. Johna Sheriff who planned to  take her to surgery for exploratory laparotomy.  Labs showed a white count  of 12,500.   PAST MEDICAL HISTORY:  1. History of breast cancer in the right breast.  Status post bilateral     mastectomies and breast reconstruction in the mid 1980s.  Status post     chemotherapy.  2. Status post a cesarean section.  3. Status post repair of abdominal hernia in 1976.  4. Status post total abdominal hysterectomy and  oophorectomy.  The patient     apparently had multiple ovarian cysts and may have also had fibroid     tumors.  5. Status post repair of an abdominal spigelian hernia in 1976.  6. History of colon polyps in the 1980s.  7. History of a thymoma in 2000.  This was treated by herbal therapy.  8. Status post multiple laparotomies, apparently seven or eight of these,     for ovarian cysts.   All of the patient's surgeries were performed in [place] prior to her  living in [place].   CURRENT MEDICATIONS:  Effexor 75 mg p.o. q.a.m.  The patient takes this to  control hot flashes.   ALLERGIES:  Penicillin which has caused anaphylaxis in the past.  REVIEW OF SYSTEMS:  NEUROLOGIC:  No headaches.  The patient has gotten a  little bit confused with her recent illness.  INFECTIOUS:  The patient  describes a fever, but did not take her temperature and she was afebrile in  the emergency room.  MUSCULOSKELETAL:  Describes a rigor mortis in hands  and feet.  As her GI problems became acute, she could not open her hands or  move her feet.  GI:  As above.  She describes having had multiple  colonoscopies in [place].  The patient has not had a colonoscopy in at  least 10 years.  ENDOCRINE:  No history of diabetes, no excessive thirst.  The patient's follow-up for her thymoma is with the herbalist who treated  her in approximately the year 2000; this person is in [place].  GU:  Has  had oliguria.  CARDIOVASCULAR:  No chest pain, no extremity edema.  No  palpitations.  ENT:  The patient wears a retainer at night.  PULMONARY:  No  shortness of breath, no cough.   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  She rarely drinks alcohol.  Does not smoke.  She  has four children aged 13 to 9.  Her home situation is living with her four  children, a 50 year old grandmother and her husband in [place].  Physically she is quite active and  exercises regularly.   FAMILY HISTORY:  No history of cancers, hypertension, kidney disease, bowel  obstruction, peptic ulcer disease, or GI bleeds.   PHYSICAL EXAMINATION:  VITAL SIGNS:  Temperature 96.5, blood pressure  136/76, pulse 64, respirations 20.  Oxygen saturation 98% on room air.  GENERAL:  The patient is alert and moderately distressed as well as  diaphoretic.  She does not look toxic.  HEENT:  Sclerae are nonicteric, conjunctiva is pink, extraocular movements  intact.  Oropharynx:  The mucus membranes are somewhat dry.  No lesions or  exudates.  NECK:  Without adenopathy, thyromegaly or JVD.  CHEST:  Clear to auscultation and percussion bilaterally.  COR:  Regular rate and rhythm.  No murmurs, rubs or gallops appreciated.  ABDOMEN:  Distended and tense.  Bowel sounds are hyperactive.  There is  rebound present, but no appreciated mass or hepatosplenomegaly.  RECTAL:  Deferred.  GU/BREASTS:  Deferred as well.  EXTREMITIES:  No cyanosis, clubbing or edema.  NEUROLOGIC:  Alert and oriented x3.  The patient is a bit slow to answer  questions.  DERMATOLOGIC:  No rashes or nonhealing sores.  HEMATOLOGIC:  No petechiae, purpura or extensive bruising.   LABORATORIES:  WBC 12.5, hemoglobin 15.8, hematocrit 44.6.  On differential  there is a left shift present.  Sodium 139, potassium 3.3.  Chloride 106,  carbon dioxide 22.  Glucose is 173.  BUN 13, creatinine 1.0.  Total  bilirubin, alkaline phosphatase, AST and ALT are within normal limits.  Albumin 4.3.  Lipase 28.  Urinalysis on microscopic shows just 3-6 wbc's, 0-  2 rbc's and a few bacteria per high power field and a few epithelial cells.  There are no nitrites or leukocyte esterase present.   RADIOLOGIC STUDIES:  As described above.   IMPRESSION:  1. Sigmoid volvulus.  Dr. Juanda Chance is admitting but plans are for the patient     to be taken to surgery by Dr. Johna Sheriff. 2. History of unilateral breast cancer.  Status post  bilateral mastectomies     and  breast reconstruction.  3. Status post multiple abdominal and pelvic surgeries as above.  4. History of thymoma, treated with herbal therapy and per the patient's     report, in remission.  5. Penicillin allergy.  6. Hyperglycemia.  7. Mild hypokalemia.    PLAN:  The plan is for the patient to be taken to surgery by Dr. Johna Sheriff.  Need to follow up on electrolyte and glucose abnormalities.     Brett Canales, P.A. [facility]                    Lina Sar, M.D. [facility]    SG/MEDQ  D:  06/11/2002  T:  06/11/2002  Job:  [id number]

## 2010-05-20 NOTE — H&P (Signed)
NAME:  [name], [name]                 ACCOUNT NO.:  [id number]   MEDICAL RECORD NO.:  [id number]          PATIENT TYPE:  INP   LOCATION:  5710                         FACILITY:  [facility]   PHYSICIAN:  Adolph Pollack, M.D.DATE OF BIRTH:  08/11/1955   DATE OF ADMISSION:  12/01/2005  DATE OF DISCHARGE:  12/03/2005                              HISTORY & PHYSICAL   CHIEF COMPLAINT:  Post hemorrhoidectomy bleeding.   HISTORY OF PRESENT ILLNESS:  This 55 year old female had a PPH  hemorrhoidectomy today and began having heavy rectal bleeding when she  got home that was unrelenting.  Her husband called me and I directed her  to come to the [facility] Emergency Department and she was delivered  there by way of EMS.  Subsequently, I met her in the emergency room.   PAST MEDICAL HISTORY:  1. Bleeding internal hemorrhoids.  2. Breast cancer.  3. Autonomic nerve dysfunction.  4. Thymoma.  5. Cecal volvulus septic shock.  6. Depression.  7. SVT.  8. Ovarian cysts.   PREVIOUS OPERATIONS:  1. Right hemicolectomy.  2. Cholecystectomy.  3. Bilateral mastectomies and then implant placement.   ALLERGIES:  1. Include PENICILLIN.  2. CODEINE.  3. HYDROCODONE.   CURRENT MEDICATIONS:  Atenolol, Effexor, Zofran, (obtained from old  chart).   REVIEW OF SYSTEMS:  She states that when she sits up that is when most  of the bleeding occurs.  She has had some nausea postanesthesia.   PHYSICAL EXAM:  Only slightly pale female.  Very pleasant and  cooperative.  Temperature is 97.7, pulse 84, blood pressure 119/70,  respiratory 20.  EYES:  Extraocular.  No icterus.  NECK:  Supple without obvious mass.  RESPIRATORY:  Breath sounds equal and clear.  Respirations unlabored.  CARDIOVASCULAR:  Regular rate and regular rhythm.  ABDOMEN:  Soft with a midline scar present and no tenderness.  GU:  There is dried perianal blood present and blood on the anal pad.  EXTREMITIES:  No cyanosis or edema.   Laboratory is pending at this time.   IMPRESSION:  Post hemorrhoidectomy bleeding.   PLAN:  We will take her to the operating room for rectal exploration and  control of bleeding.  I explained the procedure and the risks to her and  her husband.  I also told them there is a chance that despite [place]  thinking we have the bleeding controlled that she may have some  rebleeding so we will need to observe her in the hospital  postoperatively.  The seem to understand the plan.      Adolph Pollack, M.D.  Electronically Signed     TJR/MEDQ  D:  12/01/2005  T:  12/03/2005  Job:  [id number]

## 2010-05-20 NOTE — Consult Note (Signed)
NAME:  Jessica, [name]                 ACCOUNT NO.:  [id number]   MEDICAL RECORD NO.:  [id number]          PATIENT TYPE:  INP   LOCATION:  0346                         FACILITY:  [facility]   PHYSICIAN:  Sharlet Salina T. Hoxworth, M.D.DATE OF BIRTH:  04-20-1955   DATE OF CONSULTATION:  02/02/2004  DATE OF DISCHARGE:                                   CONSULTATION   CHIEF COMPLAINT:  Abdominal pain.   HISTORY OF PRESENT ILLNESS:  I was asked by Dr. Leone Payor to evaluate Ms.  [name].  She is a 55 year old white female, well known to me following  emergency right hemicolectomy performed in June 2004 for cecal volvulus with  early gangrene of the right colon.  She recovered nicely from this procedure  but beginning about 6 months after that she has been troubled with recurring  episodic abdominal pain.  She has required several hospitalizations over the  past year-and-a-half for the same pain.  About 5 or 6 days prior to this  admission she had the onset of recurrent abdominal pain identical to  previous episodes.  She describes sharp or burning pain in the right side of  her abdomen, mainly in the right upper quadrant, somewhat down toward the  right mid abdomen.  This is constant pain.  It becomes quite severe.  She  was followed by Dr. Jonny Ruiz and by Dr. Leone Payor as an outpatient with a negative  workup as described, but due to persistence and worsening of her pain was  admitted for further evaluation and treatment.  The pain has continued  during this hospitalization.  There is no radiation.  She has had some  slight decreased appetite but no nausea or vomiting.  She has chronic  diarrhea since her colectomy but this has not really changed.  No fever or  chills.  No urinary symptoms.  There are no particularly exacerbating or  alleviating factors.  Sometimes she can get in a position that is a little  more comfortable.  The pain is not made worse or better by eating or by  bowel movements.   Previous  workup has included negative gallbladder ultrasound and negative  HIDA scan in December 2004.  She has had two further CT scans of the abdomen  and pelvis including one during this episode that was entirely negative.  She has had a colonoscopy at the end of last year that was unremarkable.  She feels that overall the pain is becoming more frequent, more severe, and  more long-lasting.   PAST MEDICAL HISTORY:  Surgery:  1.  As above.  2.  History of remote right breast cancer with bilateral mastectomy,      reconstruction, and postoperative chemotherapy.  3.  She has had multiple GYN procedures over the years for ovarian cysts -      seven or eight low abdominal laparotomies.  4.  She has had a spigelian hernia repair.   Medical:  1.  She is treated for dysautonomia with history of syncope and      palpitations.  2.  She has had SVT and cardiac  ablation.  3.  History of thymoma remotely.   MEDICATIONS ON ADMISSION:  1.  Effexor 150 mg daily.  2.  Midodrine 30 mg t.i.d.  3.  Atenolol 25 mg daily.  4.  Mestinon 30 mg t.i.d.  5.  P.r.n. Vicodin.  6.  P.r.n. Zofran.   She is allergic to PENICILLIN.   SOCIAL HISTORY:  She is married, no cigarettes, occasional alcohol.   REVIEW OF SYSTEMS:  GENERAL:  Some fatigue, slight weight gain, no fever or  chills.  RESPIRATORY:  No shortness of breath, cough, wheezing.  CARDIAC:  Occasional palpitations and lightheadedness.  ABDOMEN AND GI:  As above.  GU:  No urinary burning, frequency.   PHYSICAL EXAMINATION:  VITAL SIGNS:  She is afebrile, vital signs all within  normal limits.  GENERAL:  A well-developed white female who does not appear in severe pain.  SKIN:  Warm and dry without rash or infection.  HEENT:  No palpable mass or thyromegaly.  LYMPH NODES:  No cervical, supraclavicular, inguinal nodes palpable.  LUNGS:  Clear to auscultation without wheezing or increased work of  breathing.  CARDIAC:  Regular rate and rhythm without  murmurs.  ABDOMEN:  Active bowel sounds, nondistended.  There is a well-healed low  midline incision without evidence of hernia.  There is moderate to marked  epigastric and right upper quadrant tenderness with some voluntary guarding.  No palpable masses or organomegaly.  EXTREMITIES:  No joint swelling or deformity.  NEUROLOGIC:  Alert, oriented.  Motor and sensory exam is grossly normal.   LABORATORY:  CBC, CMET, sed rate, lipase, amylase, urinalysis all normal.   Imaging:  CT scan of the abdomen and pelvis normal as above.  Plain  abdominal films showed possibly some mild dilatation of the proximal  remaining colon of questionable significance.   ASSESSMENT AND PLAN:  Recurrent and worsening episodic right upper quadrant  and right mid abdominal pain.  She has had repeated extensive workups that  are negative.  Possibilities would include biliary dyskinesia and pain  secondary to adhesions.  [name] feels that her pain is clearly worsening  and is becoming disabling for her.  I have discussed her case with Dr. Russella Dar  and he feels that any further diagnostic evaluation is unlikely to be  helpful and I agree with this.  I discussed options with Ms. Aispuro which  would include simply follow-up with attempt to control her symptoms with  medication versus proceeding with surgical intervention for the possible  above diagnoses.  I discussed possible role of laparoscopic lysis of  adhesions with a cholecystectomy for abdominal pain consistent with biliary  dyskinesia and adhesions.  We discussed the nature of the surgery and the  risks which would include bile leak or bile duct injury, intestinal injury,  the possible need for open surgery, and failure to relieve her symptoms.  She understands all these issues and feels that she really cannot continue  as she has been.  For this reason, I think it would be reasonable to proceed with intervention as described above and will plan to get  this scheduled for  her as soon as possible.    BTH/MEDQ  D:  02/02/2004  T:  02/02/2004  Job:  [id number]   cc:   [name], M.D. [facility]

## 2010-05-20 NOTE — Op Note (Signed)
NAME:  [name], [name]                 ACCOUNT NO.:  [id number]   MEDICAL RECORD NO.:  [id number]          PATIENT TYPE:  INP   LOCATION:  5710                         FACILITY:  [facility]   PHYSICIAN:  Adolph Pollack, M.D.DATE OF BIRTH:  1955-03-15   DATE OF PROCEDURE:  12/01/2005  DATE OF DISCHARGE:  12/03/2005                               OPERATIVE REPORT   PREOPERATIVE DIAGNOSIS:  Post hemorrhoidectomy bleeding.   POSTOPERATIVE DIAGNOSIS:  Post hemorrhoidectomy bleeding.   PROCEDURE:  Rectal exploration and control of bleeding by way of suture  ligation.   SURGEON:  Adolph Pollack, M.D.   ANESTHESIA:  General.   INDICATIONS:  Ms. Scovill underwent a PPH hemorrhoidectomy this morning.  When she got home she began having a small bowel bleeding and then a  larger amount of hemorrhage and called me and I had her delivered to  [facility] by EMS, and now she is brought to the  operating room for the above procedure.   TECHNIQUE:  She is brought to the operating room, placed supine on the  operating table and general anesthesia was administered.  She is placed  in the lithotomy position.  The perianal area was sterilely prepped and  draped.  Digital rectal examination was performed and a large amount of  blood was evacuated.  I subsequently inserted the anoscope and a very  large blood clot was evacuated.  I sequentially examined the entire  staple line and noted a small amount of bleeding at the 12 o'clock  position and I oversewed this with 3-0 Vicryl suture, in a figure-of-  eight fashion.  I subsequently reexamined the rest of the staple line  and noticed what appeared to be a little bit of an adherent clot at the  3 o'clock position.  I gently manipulated this and a large amount of  bleeding occurred from this spot.  I then oversewed this with a 3-0  Vicryl suture and controlled that bleeding.  I then examined the area  multiple times.  Hemostasis was  adequate.  I placed a sponge in the  rectum, waited a minute, then pulled the sponge and no bleeding was  noted.  I then placed Gelfoam on the staple line and a bulky dressing  over the area.   She tolerated the procedure well without apparent complications.  She  subsequent was taken to recovery room in satisfactory condition.      Adolph Pollack, M.D.  Electronically Signed     TJR/MEDQ  D:  12/01/2005  T:  12/03/2005  Job:  [id number]

## 2010-05-20 NOTE — Consult Note (Signed)
[facility]  Patient:    [name], [name] Visit Number: [id number] MRN: [id number]          Service Type: EMS Location: [place] Attending Physician:  Lorre Nick Dictated by:   Rollene Rotunda, M.D. [facility] Proc. Date: 02/11/01 Admit Date:  02/11/2001                            Consultation Report  PRIMARY PHYSICIAN: Corwin Levins, M.D.  CARDIOLOGIST: Nathen May, M.D., [facility]  REASON FOR CONSULTATION: Evaluate patient with chest discomfort.  HISTORY OF PRESENT ILLNESS: The patient is a pleasant 55 year old white female, with a long history of supraventricular tachycardia and multiple EP procedures for ablation of accessory pathways.  She reports that she has had negative stress tests in the past.  She reports that today while at school she noticed some chest discomfort.  This is a similar feeling to sensation she has had after episodes of SVT.  However, she does not recall having any supraventricular tachycardia - although she thinks she could have had these without significant palpitations.  This discomfort was a heaviness that was to the left side of her chest.  There was no radiation to her arm.  She did have tingling inside her mouth.  She had no nausea, vomiting, or diaphoresis. Again, she felt no palpitations and had no presyncope or syncope.  She had no shortness of breath, PND, or orthopnea.  The symptoms resolved spontaneously in the emergency room.  An EKG demonstrated no evidence of acute ST-T wave changes or other abnormalities.  The patient is a very active young woman.  She exercises routinely.  She exercised aerobically with brisk walking yesterday.  With this she denies any cardiac symptoms.  PAST MEDICAL HISTORY: She has no history of diabetes, hypertension, or hyperlipidemia.  There is a history of ductal carcinoma of the breast.  PAST SURGICAL HISTORY:  1. Bilateral mastectomy.  2. TAH/BSO.  3. Breast reconstruction  therapy.  ALLERGIES: PENICILLIN.  MEDICATIONS: None.  SOCIAL HISTORY: [place]  She is married.  She is a Engineer, site.  She has never smoked cigarettes and does not drink alcohol.  She exercises vigorously.  FAMILY HISTORY: Noncontributory for early coronary artery disease or sudden cardiac death.  Her father did have coronary disease in his 35s.  REVIEW OF SYSTEMS: As stated in the HPI.  Otherwise, negative for all other systems.  PHYSICAL EXAMINATION:  GENERAL: The patient is in no distress.  VITAL SIGNS: Blood pressure 120/80, heart rate 72 and regular, temperature 98.6 degrees.  HEENT: Eyelids unremarkable.  PERRL.  Fundi not visualized.  Oral mucosa unremarkable.  NECK: No jugular venous distention, wave form within normal limits, carotid upstrokes brisk and symmetrical.  No bruits, thyromegaly.  LYMPHATICS: No cervical, axillary, or inguinal adenopathy.  LUNGS: Clear to auscultation bilaterally.  BACK: No costovertebral angle tenderness.  CHEST: Unremarkable.  Status post bilateral breast implants.  HEART: PMI not displaced or sustained.  S1 and S2 within normal limits.  No S3, no S4.  No murmurs, clicks.  No rubs.  ABDOMEN: Flat.  Positive bowel sounds, normal in frequency and pitch.  No bruits, rebound, guarding, midline pulsatile mass, hepatomegaly, or splenomegaly.  SKIN: No rash, no nodules.  EXTREMITIES: Pulses 2+ throughout.  No edema.  NEUROLOGIC: Oriented to person, place, and time.  Cranial nerves II-XII grossly intact.  Motor grossly intact.  LABORATORY  DATA: EKG, sinus rhythm, axis within normal limits; intervals within normal limits; RSR prime in V1 and V2; no acute ST-T wave changes.  Sodium 140, potassium 3.9, chloride 106, glucose 93, BUN 13, creatinine 0.9. CK 130, MB 1.2, troponin 0.01.  ASSESSMENT/PLAN:  1. Chest discomfort.  I had a long (greater than 30 minutes) discussion with     the  patient and her husband.  Given the somewhat atypical nature of the     symptoms and the similarity to previous supraventricular tachycardia, as     well as the absence of risk factors, normal physical examination, normal     electrocardiogram and normal enzymes, I think that the likelihood of     obstructive coronary disease as an etiology of her chest discomfort is     extremely low.  I do not think that inpatient monitoring is warranted.  I     do think that screening with a stress test would be of value.  The patient     and her husband understand completely the risks and benefits of discharge     from the emergency room and further outpatient evaluation.  We will set     her up for an exercise rest stress Cardiolite.  Further evaluation will be     based on these results.  2. Supraventricular tachycardia.  The patient will return to Dr. Graciela Husbands for     follow-up of this and further management.  Although I have no definitive     evidence that she has had a recurrent dysrhythmia, it is known that she     has residual bypass tracts.  3. Follow-up as above.  The patient understands the need to present to the     emergency room again if she has recurrent chest discomfort. Dictated by:   Rollene Rotunda, M.D. [facility] Attending Physician:  Lorre Nick DD:  02/11/01 TD:  02/12/01 Job: [id number]

## 2010-05-20 NOTE — H&P (Signed)
NAME:  [name], [name]                 ACCOUNT NO.:  [id number]   MEDICAL RECORD NO.:  [id number]          PATIENT TYPE:  INP   LOCATION:  0157                         FACILITY:  [facility]   PHYSICIAN:  Rachael Fee, M.D. DATE OF BIRTH:  [birth year]   DATE OF ADMISSION:  01/04/2005  DATE OF DISCHARGE:  01/06/2005                                HISTORY & PHYSICAL   CHIEF COMPLAINT:  Rectal bleeding and diarrhea.   HISTORY OF PRESENT ILLNESS:  [name] is a 55 year old white female known to  Dr. Leone Payor who has history of chronic diarrhea who is in the process of  undergoing evaluation with Dr. Leone Payor. She relates that she has had  diarrhea since 2004 at which time she had a right hemicolectomy for cecal  volvulus.  She generally has six to seven bowel movements per day,  occasionally with incontinence due to urgency but over the past 24 hours has  had approximately 14 loose stools associated with lower abdominal cramping  and then rectal bleeding which started the evening prior to admission and  then continued on January 04, 2005.  She describes this as bright red blood  with small clots.  She presented to the emergency room for evaluation and  was found to be hemodynamically stable with a hemoglobin of 12.9, had bright  red blood on rectal examination per the emergency room and is at this time  admitted for hydration and further diagnostic evaluation.   CURRENT MEDICATIONS:  1.  Atenolol 25 mg daily.  2.  Midodrine 5 mg t.i.d.  3.  Effexor 150 mg daily.  4.  Zofran 8 mg q.a.m. and then q.6h. PRN.  5.  Hyoscyamine b.i.d. which she has been taking PRN.  6.  Tylenol #3 as needed.  7.  Lomotil 2 tablets before meals.  8.  Multivitamin with calcium daily.  9.  Aspirin 81 mg daily.   ALLERGIES:  PENICILLIN which causes hives.   PAST MEDICAL HISTORY:  Is pertinent for dysautonomia, breast cancer status  post bilateral mastectomies, iodine RX,  history of cecal volvulus 2004  status post  right hemicolectomy, status post laparoscopic cholecystectomy in  March 2006, history of thymoma, history of ovarian cysts with prior  laparoscopies.   FAMILY HISTORY:  One sister deceased with ovarian cancer.   SOCIAL HISTORY:  The patient is married.  She has four children.  She is a  nonsmoker, nondrinker.   REVIEW OF SYSTEMS:  CARDIOVASCULAR:  Negative for chest pain or anginal  symptoms.  PULMONARY: Negative for cough, shortness of breath or sputum  production.  She does have problems with orthostasis which is normal for her  and has had some dizziness, otherwise as outlined above in gastrointestinal.   PHYSICAL EXAMINATION:  GENERAL APPEARANCE:  Well-developed white female in  no acute distress.  She was alert and oriented.  VITAL SIGNS:  Blood pressure 151/93, pulse in the 60's, respirations 20.  HEENT:  Normocephalic, atraumatic.  Extraocular movements intact.  Pupils  equal, round, reactive to light and accommodation.  Sclerae anicteric.  CARDIOVASCULAR:  Regular rate and rhythm  with S1, S2.  PULMONARY:  Clear to auscultation and percussion.  ABDOMEN:  Soft, she is mildly tender across the lower abdomen.  Bowel sounds  are active.  There is no guarding or rebound.  RECTAL:  Examination was not repeated but revealed  bright red blood per the  emergency room staff.   LABORATORY DATA:  On admission shows white blood cell count 5.3, hemoglobin  12.9, platelet count 263,000, INR was 1.  Sodium 138, potassium 3.6, BUN 15,  creatinine 0.8, liver function tests normal.  Albumin 3.8.   IMPRESSION:  95.  55 year old white female with chronic diarrhea and now with superimposed      acute diarrhea, lower abdominal cramping and hematochezia.  Rule out      superimposed gastroenteritis and hemorrhoidal bleeding, rule out      possible superimposed ischemic colitis.  2.  Dysautonomia.  3.  Status post right hemicolectomy for cecal volvulus.  4.  Status post cholecystectomy.  5.   History of breast cancer, status post bilateral mastectomies.  6.  History of thymoma, status post iodine RX.   PLAN:  The patient is admitted to the service of Dr. Wendall Papa for  intravenous fluid hydration, serial hemoglobin and hematocrit's,  transfusions as indicated. Will prep this evening for colonoscopy on January 05, 2005 with Dr. Leone Payor.  For details, please see the orders.      Mike Gip, P.A.-C. [facility]    ______________________________  Rachael Fee, M.D.    AE/MEDQ  D:  01/09/2005  T:  01/09/2005  Job:  [id number]   cc:   [name], M.D. [facility]  [address] [place], [place] [address]

## 2010-05-20 NOTE — H&P (Signed)
NAME:  [name], [name]                 ACCOUNT NO.:  [id number]   MEDICAL RECORD NO.:  [id number]          PATIENT TYPE:  INP   LOCATION:  5715                         FACILITY:  [facility]   PHYSICIAN:  Barbette Hair. Artist Pais, DO      DATE OF BIRTH:  [birth year]   DATE OF ADMISSION:  04/12/2006  DATE OF DISCHARGE:                              HISTORY & PHYSICAL   PRIMARY CARE DOCTOR:  Corwin Levins, M.D.   CHIEF COMPLAINT:  Severe right lower quadrant abdominal pain.   HISTORY OF PRESENT ILLNESS:  Patient is a 55 year old white female with  a complex medical history, including chronic autonomic insufficiency and  right colectomy in 2004 for ischemic colitis and cecal volvulus, who  presents with acute right lower quadrant pain.  The pain started last  night, and she spoke to Dr. Jonny Ruiz, who instructed patient to go to the  ER.  She did not seek emergency care but wanted an office visit today.   She chronically has diarrhea and has 11 bowel movements a day.  She  states that over the last 18 hours, she has not had any bowel movement.  She notes severe right lower quadrant pain that worsens with movement.  She has not had any nausea or vomiting.  No fever is noted.   She is followed by Dr. Juanda Chance for irritable bowel syndrome and has had a  chronic right-sided abdominal pain in the past.  Her last colonoscopy  was in November, 2007, which was notable for internal and external  hemorrhoids.   PAST MEDICAL HISTORY:  1. Chronic autonomic insufficiency.  2. Status post right hemicolectomy in 2004 for ischemic colitis and      cecal volvulus.  3. History of breast cancer with bilateral mastectomies.  4. History of hysterectomy.  5. Status post appendectomy.  6. Status post cholecystectomy.  7. Vagus nerve neuropathy.  8. Chronic headaches.  9. History of thymoma.  10.Irritable bowel syndrome.   CURRENT MEDICATIONS:  1. Effexor XR 150 mg once daily.  2. Midrin 5 mg t.i.d.  3. Atenolol 25 mg once  daily.  4. Lomotil t.i.d.  5. Zofran b.i.d.  6. Cholestyramine, unknown dose, as needed.  7. Adderall XR 20 mg b.i.d.   ALLERGIES TO MEDICATIONS:  PENICILLIN, CODEINE.   SOCIAL HISTORY:  Patient is a disabled Tourist information centre manager.  She  is married.  She denies alcohol or tobacco.   FAMILY HISTORY:  Notable for ovarian cancer.   REVIEW OF SYSTEMS:  As noted above.  All other systems negative.   PHYSICAL EXAMINATION:  VITAL SIGNS:  Temperature 96.6, pulse 84, BP  137/88, left arm in seated position.  GENERAL:  Patient is a pleasant, well-developed and well-nourished 55-  year-old white female in no apparent distress.  HEENT:  Normocephalic and atraumatic.  Pupils are equal and reactive to  light bilaterally.  Extraocular motility was intact.  Patient was  anicteric.  Conjunctivae was within normal limits.  External auditory  canals intact.  Tympanic membranes are clear bilaterally.  NECK:  Supple.  No adenopathy, carotid bruits,  or thyromegaly.  LUNGS:  Normal respiratory effort.  Chest is clear to auscultation  bilaterally.  No rales, rhonchi or wheezing.  CARDIOVASCULAR:  Regular rate and rhythm.  No significant murmurs, rubs  or gallops appreciated.  ABDOMEN:  Soft.  Patient had right lower quadrant tenderness.  No  guarding.  Questionable rebound.  Patient did have increased pain with  internal and external rotation of her right lower extremity.  MUSCULOSKELETAL:  No clubbing, cyanosis or edema.  SKIN:  Warm and dry.  NEUROLOGIC:  Cranial nerves II-XII are grossly intact.  She is nonfocal.   IMPRESSION:  1. Severe right lower quadrant pain in a 55 year old white female with      a history of right hemicolectomy for ischemic colitis and cecal      volvulus.  2. History of bilateral mastectomy.  3. History of irritable bowel syndrome.  4. Hypertension.  5. Depression.   RECOMMENDATIONS:  Unclear at this time whether the patient's acute  abdominal pain is secondary to  IBS versus a more ominous cause.  I  recommended hospitalization and obtaining a CAT scan of her abdomen and  pelvis with oral and IV contrast.  We will check a CBC.  Also check a  sed rate and consider GI and surgical consultation based on CT scan  results.  We will continue her home medications.      Barbette Hair. Artist Pais, DO  Electronically Signed     RDY/MEDQ  D:  04/12/2006  T:  04/12/2006  Job:  [id number]   cc:   Corwin Levins, MD

## 2010-05-20 NOTE — H&P (Signed)
NAME:  [name], [name]                           ACCOUNT NO.:  [id number]   MEDICAL RECORD NO.:  [id number]                   [name] TYPE:  INP   LOCATION:  5707                                 FACILITY:  [facility]   PHYSICIAN:  Lina Sar, M.D. [facility]               DATE OF BIRTH:  1955-05-11   DATE OF ADMISSION:  12/31/2002  DATE OF DISCHARGE:                                HISTORY & PHYSICAL   REASON FOR ADMISSION:  Persistent upper lateral abdominal/flank pain.   HISTORY OF PRESENT ILLNESS:  [name] is a pleasant 55 year old white  female who has undergone multiple prior abdominal and pelvic surgeries. In  June of 2004, this year, [name] presented with abdominal pain and question of  sigmoid volvulus based on Gastrografin study. Dr. Lina Sar performed  attempted colonoscopic decompression, but encountered obstruction in Jessica  right colon consistent with volvulus. [name] went to exploratory laparotomy and  right hemicolectomy on June 9 by Dr. Colin Benton. Jessica diagnosis was that  of an obstructive ischemia of Jessica right colon secondary to adhesions from  multiple prior surgeries. Since that time [name] has done well as far as  keeping [name] weight up, eating, and being free of abdominal pain. However,  [name] has had persistent loose stools, sometimes watery stools, about five or  six a day. Just before Christmas, however, [name] developed onset of  abdominal pain located laterally in Jessica abdomen up underneath Jessica ribs on  both sides. It hurts worse when [name] takes a breath. [name] is nauseated, but  has not thrown up, but feels like [name] might do so at any point. When [name]  moves around [name] gets flushed and sweaty, but [name] has not had any fevers.  Urinary habits are unchanged. [name] has not been eating much these last  several days and subsequently bowel movement volume and frequency has fallen  off. [name] abdomen has not become distended. [name] was seen by Dr.  Juanda Chance in Jessica office on  December  27, about three days after Jessica onset of  these symptoms. [name] obtained labs including urine, CBC, and CMET. All of  this was negative with Jessica exception of an alkaline phosphatase level that  was slightly elevated at 121. A single view abdominal film showed nothing  acute. A phlebolith was incidentally noted in Jessica left pelvis. No urinary  calculi were seen. An ultrasound was performed Jessica same day and was negative  for anything acute. Jessica common bile duct was 4 mm. There were no gallstone  nor gallbladder wall thickening. Visualized pancreas was normal.  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 There was no ascites, no  liver problems, or  no liver abnormalities. [name] was started by Dr.  Juanda Chance on oral Cipro and [name] has had about four doses of that so far with no  improvement. [name] came back again to see  Dr. Juanda Chance today, two days  later. Jessica pain has been increasing in its intensity and it is now  persistent. [name] is quite uncomfortable. Dr. Juanda Chance saw [name] in Jessica office  today and decided to admit [name] for further evaluation to include CT scan and  further labs.   PAST MEDICAL HISTORY:  1. History of right-sided breast cancer status post bilateral mastectomies     and breast reconstruction during Jessica 1980s, status post chemotherapy.  2. Status post cesarean section.  3. Status post repair of abdominal hernia in 1976.  4. Status post abdominal hysterectomy and oophorectomy in 1997.  5. History of multiple ovarian cysts and possibly fibroid tumors as well.     Status post multiple laparotomies, apparently seven or eight of these for     ovarian cysts. Note that most of [name]'s surgeries were performed     in [place] prior to [name] living in [place], although Jessica 2004     surgery was performed here in [place].  6. Status post repair of abdominal spigelian hernia in 1976.  7. History of colon polyps in Jessica 1980s.  8. History of thymoma in 2000 treated  with oval therapy.  9. Status post cardiac ablations of arrhythmia once in [facility] by Dr.     Clide Cliff and twice at [facility] in about 1996 or 1997.   ALLERGIES:  Penicillin causes a rash. Codeine and Vicodin cause itching.   CURRENT MEDICATIONS:  1. Effexor 75 mg p.o. q.a.m. [name] takes this to control hot flashes.  2. Multivitamin once daily.  3. Ciprofloxacin 500 mg p.o. b.i.d.   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 Rare alcohol use. Does not smoke. Has four children  approximately age 23-22. [name] lives with [name] husband, four children, and 62-  year-old grandmother in [place]. Physically [name] is quite active and  exercises regularly.   FAMILY HISTORY:  No history of cancers, hypertension, kidney disease, bowel  obstruction, peptic ulcer disease, or GI bleed. [name] mother does have a  history of Prinzmetal's angina.   REVIEW OF SYSTEMS:  NEUROLOGIC:  No headaches, no visual problems. No  weakness, no falls. INFECTIOUS:  No fevers, no chills, just Jessica hot flashes  as above. MUSCULOSKELETAL:  No joint pain. No recent increase in activity or  change in activity, or history of heavy lifting to suggest any  musculoskeletal source for [name] symptoms. GENERAL:  [name] is active.  [name] works out a few days a week, however, in Jessica last several days [name] has  not had Jessica energy to do this. [name] does have hot flashes, which are  separate from these episodes of flushing described above. [name] takes Effexor  to control Jessica hot flashes. CARDIOVASCULAR:  No recent episodes of  tachycardia, irregular heart beat, or palpitations. No chest pain. No  extremity edema.  PULMONARY:  No shortness of breath, no cough.   PHYSICAL EXAMINATION:  VITAL SIGNS:  Weight 155 pounds, blood pressure  120/70, pulse 74, respirations 14, temperature not yet obtained. GENERAL:  [name] is mildly distressed, but generally healthy appearing  white female.   HEENT:  Sclerae nonicteric. Conjunctivae is pink. Extraocular movements are  intact. Oropharynx is moist and clear.  NECK:  Without JVD, bruits. No thyromegaly or masses appreciated.  CHEST:  Clear to auscultation and percussion bilaterally. There is some  costovertebral angle tenderness bilaterally.  CARDIOVASCULAR:  There is regular rate and  rhythm, no murmurs, rubs, or  gallops.  ABDOMEN:  Nondistended. Keloid formation in an erythematous, but well-intact  low midline scar visible. No hernias appreciated. There is slight epigastric  tenderness, but more focal tenderness particularly up under Jessica ribs very  lateral in Jessica right upper quadrant and similar location, but not quite so  lateral in Jessica left upper quadrant. No masses appreciated at Jessica points of  tenderness. No hepatosplenomegaly, no bruits. Bowel sounds are active.  RECTAL:  Palpable first degree hemorrhoids internally and visibly in Jessica  anal canal. Stool hemoccult negative.  EXTREMITIES:  Without cyanosis, clubbing, or edema.  NEUROLOGIC:  [name] is alert and oriented x3. [name] is an excellent historian.  PSYCHIATRIC:  [name] is in good spirits. Does not appear depressed.  Affect is normal.   LABORATORY DATA:  Labs from Jessica 27th show a negative urinalysis. Hemoglobin  was 14.7, hematocrit 43.9, MCV 82, platelets 308, and white blood cell count  6.0. A differential was within normal limits with no left shift. Glucose  101, BUN 9, creatinine 1.0. Potassium 4.56. Albumin 4.1. Total bilirubin  0.7. Alkaline phosphatase 121, AST 24, ALT 21. Pending and just drawn labs  include CMET, amylase, lipase, sed rate, and a repeat CBC. Also pending a CT  scan of Jessica abdomen, pelvis, and lower chest with oral and IV contrast.   IMPRESSION:  1. Upper abdominal and back pain, question etiology, rule out     musculoskeletal, rule out spine related, possibly neuropathy from yet to     be manifested shingles. Biliary etiology despite Jessica  negative ultrasound.  2. History of a cecal torsion from adhesions leading to ischemic bowel     status post exploratory laparotomy and right hemicolectomy June 11, 2002.  3. History of arrhythmia, quiescent, but did have repeated ablations in Jessica     past of SVT.  4. Status post multiple abdominal pelvic surgeries as described above.  5. History of breast cancer unilaterally, but status post bilateral     mastectomies.   PLAN:  [name] was admitted to Dr. Regino Schultze service for pain management,  CT scan, and further labs. Have called Dr. Salley Scarlet office for surgical  evaluation.      Jennye Moccasin, P.A. [facility]                   Lina Sar, M.D. [facility]    SG/MEDQ  D:  12/31/2002  T:  12/31/2002  Job:  [id number]

## 2010-05-31 ENCOUNTER — Encounter: Payer: Self-pay | Admitting: Internal Medicine

## 2010-05-31 DIAGNOSIS — Z8719 Personal history of other diseases of the digestive system: Secondary | ICD-10-CM

## 2010-05-31 DIAGNOSIS — Z0000 Encounter for general adult medical examination without abnormal findings: Secondary | ICD-10-CM | POA: Insufficient documentation

## 2010-06-01 ENCOUNTER — Ambulatory Visit (INDEPENDENT_AMBULATORY_CARE_PROVIDER_SITE_OTHER): Payer: Medicare Other | Admitting: Internal Medicine

## 2010-06-01 ENCOUNTER — Encounter: Payer: Self-pay | Admitting: Internal Medicine

## 2010-06-01 VITALS — BP 130/82 | HR 85 | Temp 97.9°F | Ht 69.0 in

## 2010-06-01 DIAGNOSIS — R5381 Other malaise: Secondary | ICD-10-CM

## 2010-06-01 DIAGNOSIS — H9209 Otalgia, unspecified ear: Secondary | ICD-10-CM

## 2010-06-01 DIAGNOSIS — I10 Essential (primary) hypertension: Secondary | ICD-10-CM

## 2010-06-01 DIAGNOSIS — Z978 Presence of other specified devices: Secondary | ICD-10-CM

## 2010-06-01 DIAGNOSIS — E785 Hyperlipidemia, unspecified: Secondary | ICD-10-CM

## 2010-06-01 DIAGNOSIS — Z86010 Personal history of colonic polyps: Secondary | ICD-10-CM

## 2010-06-01 DIAGNOSIS — H6980 Other specified disorders of Eustachian tube, unspecified ear: Secondary | ICD-10-CM

## 2010-06-01 DIAGNOSIS — R5383 Other fatigue: Secondary | ICD-10-CM | POA: Insufficient documentation

## 2010-06-01 DIAGNOSIS — Z9882 Breast implant status: Secondary | ICD-10-CM

## 2010-06-01 DIAGNOSIS — J309 Allergic rhinitis, unspecified: Secondary | ICD-10-CM

## 2010-06-01 MED ORDER — CEFUROXIME AXETIL 250 MG PO TABS: 250.0000 mg | ORAL_TABLET | Freq: Two times a day (BID) | ORAL | Status: AC

## 2010-06-01 MED ORDER — METHYLPHENIDATE HCL ER (OSM) 36 MG PO TBCR: 36.0000 mg | EXTENDED_RELEASE_TABLET | Freq: Every day | ORAL | Status: DC

## 2010-06-01 MED ORDER — FLUTICASONE PROPIONATE 50 MCG/ACT NA SUSP: 2.0000 | Freq: Every day | NASAL | Status: DC

## 2010-06-01 MED ORDER — LEVOTHYROXINE SODIUM 50 MCG PO TABS: 50.0000 ug | ORAL_TABLET | Freq: Every day | ORAL | Status: DC

## 2010-06-01 NOTE — Patient Instructions (Signed)
Take all new medications as prescribed Continue all other medications as before You will be contacted regarding the referral for: Plastic surgury (Dr Nita Sells C.), and GI Please go to LAB in the Basement for the blood and/or urine tests to be done today Please call the phone number [phone] (the PhoneTree System) for results of testing in 2-3 days;  When calling, simply dial the number, and when prompted enter the MRN number above (the Medical Record Number) and the # key, then the message should start. Please return in 6 mo or sooner if needed

## 2010-06-06 ENCOUNTER — Encounter: Payer: Self-pay | Admitting: Internal Medicine

## 2010-06-06 ENCOUNTER — Telehealth: Payer: Self-pay | Admitting: Internal Medicine

## 2010-06-06 NOTE — Assessment & Plan Note (Signed)
Mild worsening, to add flonase asd,  to f/u any worsening symptoms or concerns

## 2010-06-06 NOTE — Telephone Encounter (Signed)
See separate note.

## 2010-06-06 NOTE — Assessment & Plan Note (Signed)
Due for replacement implant per pt - will refer to plastic surgury

## 2010-06-06 NOTE — Telephone Encounter (Signed)
Dr. Gessner do you approve? 

## 2010-06-06 NOTE — Assessment & Plan Note (Signed)
stable overall by hx and exam, most recent data reviewed with pt, and pt to continue medical treatment as before Lab Results  Component Value Date   LDLCALC 73 02/11/2008    

## 2010-06-06 NOTE — Telephone Encounter (Signed)
She would like to switch to Dr Juanda Chance?

## 2010-06-06 NOTE — Telephone Encounter (Signed)
I do but I believe she switched from Dr. Juanda Chance to me several years ago .Marland KitchenMarland KitchenMarland KitchenMarland Kitchen

## 2010-06-06 NOTE — Assessment & Plan Note (Signed)
/  Mild, for mucinex otc prn,  to f/u any worsening symptoms or concerns 

## 2010-06-06 NOTE — Telephone Encounter (Signed)
Yes I know she would like to see Dr. Juanda Chance but I believe she was her patient in past, also. Might need to check that.

## 2010-06-06 NOTE — Assessment & Plan Note (Signed)
Due for f/u colonscopy 

## 2010-06-06 NOTE — Telephone Encounter (Signed)
I checked IDX and the patient has a history with Dr Leone Payor from 2006 to present.  Dr Juanda Chance will you accept the patient?

## 2010-06-06 NOTE — Telephone Encounter (Signed)
I think the switch occurred when she was in the hospital with acute ischemic colitis  And Dr Leone Payor took care of her. Dr Leone Payor and I then talked and I felt that it was natural for him to continue her care. I"ll what ever pt prefers.

## 2010-06-06 NOTE — Assessment & Plan Note (Signed)
Etiology unclear, Exam otherwise benign, to check labs as documented, follow with expectant management  

## 2010-06-06 NOTE — Assessment & Plan Note (Signed)
stable overall by hx and exam, most recent data reviewed with pt, and pt to continue medical treatment as before  BP Readings from Last 3 Encounters:  06/01/10 130/82  02/17/10 122/78  01/07/10 140/90

## 2010-06-06 NOTE — Assessment & Plan Note (Signed)
C/w recurrent infectious illness - for ceftin asd,  to f/u any worsening symptoms or concerns

## 2010-06-06 NOTE — Progress Notes (Signed)
Subjective:    Patient ID: [name], female    DOB: May 19, 1955, 55 y.o.   MRN: [id number]  HPI  Here with acute onset mild to mod 2-3 days ST, HA, general weakness and malaise, with bilateral ear pain, fever, on top of several wks ongoing sinus congestion with clearish drainage but Pt denies chest pain, increased sob or doe, wheezing, orthopnea, PND, increased LE swelling, palpitations, dizziness or syncope. Does have sense of ongoing fatigue, but denies signficant hypersomnolence.  Pt denies new neurological symptoms such as new headache, or facial or extremity weakness or numbness   Pt denies polydipsia, polyuria.  Pt states overall good compliance with meds, trying to follow lower cholesterol diet, wt overall stable but little exercise however.  Denies worsening depressive symptoms, suicidal ideation, or panic, though has ongoing anxiety, not increased recently.  Past Medical History  Diagnosis Date  . Chronic sinus infection 04/12/2010  . Abdominal pain, epigastric 10/25/2007  . Abdominal pain, right lower quadrant 11/20/2006  . Abdominal pain, right upper quadrant 10/23/2006  . ADD 01/20/2009  . ADIPOSITY, LOCALIZED 10/15/2006  . ALLERGIC RHINITIS 10/15/2006  . ANGIOEDEMA 08/12/2008  . ANIMAL BITE 10/07/2009  . ANXIETY DEPRESSION 04/04/2007  . ANXIETY 10/15/2006  . ASTHMATIC BRONCHITIS, ACUTE 01/27/2008  . Cellulitis and abscess of leg, except foot 08/12/2007  . CELLULITIS, FACE 12/21/2008  . CHEST PAIN 06/14/2007  . COLECTOMY, PARTIAL, WITH ANASTOMOSIS, HX OF 10/15/2006  . COLONIC POLYPS, HX OF 10/15/2006  . Contusion of unspecified site 04/21/2008  . CYST, OVARIAN NEC/NOS 10/15/2006  . Dengue 02/22/2010  . DEPRESSION 10/15/2006  . DVT, HX OF 10/15/2006  . DYSAUTONOMIA 10/15/2006  . Dysfunction of eustachian tube 05/26/2009  . EAR PAIN, BILATERAL 08/16/2007  . GI BLEEDING 04/04/2007  . Glossitis 03/06/2008  . HEADACHE, CHRONIC 04/04/2007  . Hematemesis 10/25/2007  . HEMORRHOIDS 04/04/2007    . HYPERLIPIDEMIA 12/12/2007  . HYPERTENSION 10/15/2006  . HYPOTHYROIDISM 03/06/2008  . IBS 04/04/2007  . MASTECTOMY, BILATERAL, HX OF 08/12/2009  . NECK MASS 07/01/2007  . NEOP, MALIGNANT, FEMALE BREAST NOS 10/15/2006  . 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 OTITIS MEDIA, ACUTE, BILATERAL 12/12/2007  . RASH-NONVESICULAR 02/13/2008  . RECTAL BLEEDING 04/04/2007  . SHY-DRAGER SYNDROME 01/17/2007  . SINUSITIS- ACUTE-NOS 10/15/2006  . SYNCOPE 07/01/2007  . URI 07/29/2007  . VERTIGO 01/17/2007  . VIRAL INFECTION 02/17/2010  . VITAMIN D DEFICIENCY 03/06/2008  . Wheezing 08/20/2008  . WOUND, OPEN, LEG, WITHOUT COMPLICATION 08/16/2007   Past Surgical History  Procedure Date  . Abdominal hysterectomy   . Oophorectomy   . Cholecystectomy   . Spigellian hernia   . Multiple ablations   . Bilateral mastectomy/reconstruction   . Cesarean section     reports that she has never smoked. She does not have any smokeless tobacco history on file. She reports that she does not drink alcohol or use illicit drugs. family history includes Cancer in her sister. Allergies  Allergen Reactions  . Penicillins     REACTION: shock   Current Outpatient Prescriptions on File Prior to Visit  Medication Sig Dispense Refill  . Acetaminophen-Codeine (TYLENOL/CODEINE #3) 300-30 MG per tablet Take 1 tablet by mouth 4 (four) times daily as needed for pain.  120 tablet  1  . celecoxib (CELEBREX) 200 MG capsule Take 200 mg by mouth 2 (two) times daily.        [name] losartan (COZAAR) 100 MG tablet Take 100 mg by mouth daily.        [name]  venlafaxine (EFFEXOR-XR) 150 MG 24 hr capsule Take 150 mg by mouth daily.        [name] zolpidem (AMBIEN) 10 MG tablet Take 10 mg by mouth at bedtime as needed.        . cefUROXime (CEFTIN) 250 MG tablet Take 250 mg by mouth 2 (two) times daily.        . fexofenadine (ALLEGRA) 180 MG tablet Take 180 mg by mouth daily.         Review of Systems All otherwise neg per pt     Objective:   Physical Exam BP  130/82  Pulse 85  Temp(Src) 97.9 F (36.6 C) (Oral)  Ht 5\' 9"  (1.753 m)  SpO2 98% Physical Exam  VS noted Constitutional: Pt appears well-developed and well-nourished.  HENT: Head: Normocephalic.  Right Ear: External ear normal.  Left Ear: External ear normal.  Bilat tm's severe erythema.  Sinus nontender.  Pharynx mild erythema Eyes: Conjunctivae and EOM are normal. Pupils are equal, round, and reactive to light.  Neck: Normal range of motion. Neck supple. but with bilat tender submandib LA Cardiovascular: Normal rate and regular rhythm.   Pulmonary/Chest: Effort normal and breath sounds normal.  Abd:  Soft, NT, non-distended, + BS Neurological: Pt is alert. No cranial nerve deficit.  Skin: Skin is warm. No erythema.  Psychiatric: Pt behavior is normal. Thought content normal. 1+ nervous        Assessment & Plan:

## 2010-06-17 ENCOUNTER — Other Ambulatory Visit: Payer: Self-pay

## 2010-06-17 MED ORDER — ACETAMINOPHEN-CODEINE 300-30 MG PO TABS: 1.0000 | ORAL_TABLET | Freq: Four times a day (QID) | ORAL | Status: DC | PRN

## 2010-06-17 NOTE — Telephone Encounter (Signed)
Pt's husband called requesting refill  Of pt's Tylenol #3 be sent to [facility] on [address]

## 2010-06-17 NOTE — Telephone Encounter (Signed)
Pt informed. Rx faxed to Pharmacy.

## 2010-06-18 ENCOUNTER — Encounter: Payer: Self-pay | Admitting: Internal Medicine

## 2010-06-29 ENCOUNTER — Other Ambulatory Visit: Payer: Self-pay | Admitting: Internal Medicine

## 2010-06-29 ENCOUNTER — Other Ambulatory Visit: Payer: Self-pay

## 2010-06-29 MED ORDER — CEFUROXIME AXETIL 250 MG PO TABS: 250.0000 mg | ORAL_TABLET | Freq: Two times a day (BID) | ORAL | Status: DC

## 2010-06-29 NOTE — Telephone Encounter (Signed)
Pt advised of Rx/pharmacy 

## 2010-06-29 NOTE — Telephone Encounter (Signed)
Pt called stating his ear infection has returned. Pt is requesting a refill of ABS she was prescribed at her last office visit.

## 2010-07-16 ENCOUNTER — Encounter: Payer: Self-pay | Admitting: Internal Medicine

## 2010-07-16 ENCOUNTER — Ambulatory Visit (INDEPENDENT_AMBULATORY_CARE_PROVIDER_SITE_OTHER): Payer: Medicare Other | Admitting: Internal Medicine

## 2010-07-16 VITALS — BP 140/100 | HR 88 | Temp 98.7°F

## 2010-07-16 DIAGNOSIS — K625 Hemorrhage of anus and rectum: Secondary | ICD-10-CM

## 2010-07-16 DIAGNOSIS — H9209 Otalgia, unspecified ear: Secondary | ICD-10-CM

## 2010-07-16 DIAGNOSIS — J069 Acute upper respiratory infection, unspecified: Secondary | ICD-10-CM

## 2010-07-16 MED ORDER — CEFUROXIME AXETIL 250 MG PO TABS: 250.0000 mg | ORAL_TABLET | Freq: Two times a day (BID) | ORAL | Status: DC

## 2010-07-16 NOTE — Progress Notes (Signed)
  Subjective:    Patient ID: [name], female    DOB: 1955-09-11, 55 y.o.   MRN: [id number]  HPI Comes in to Saturday clinic for ear pain because she is sure she has another ear infection. Sx for about 10 days and getting worse. She states she gets this a lot and last rx wa ceftin in [place] which helped.  Uses deongestants pre flight and as needed.  Currently  Has tender nneck nodes and ear pain . Worse with swallowing and when lays down. Some congestion. Minor cough . No cp sob chills or ? Change in hearing.  No face pain Saw ent a few years ago  And offered ear tubes.    Also noted yesterday some BRRB around stool but no real diarrhea or dysentery sx . She has had a small rectal bleed in the past . ?Hemorr   utd on her colon.   Review of Systems No wheezy HA vomiting  Rest as per hpi   On multiple meds  No recent travel.  Rest as per hpi  Past history family history social history reviewed in the electronic medical record. No recnet labs or check up and  Visits most;y for recurrent ear pain problems    Objective:   Physical Exam WDWN in nad looks well.  HEENT: Normocephalic ;atraumatic , Eyes;  PERRL, EOMs  Full, lids and conjunctiva clear,,Ears: no deformities, canals nl, TM landmarks normal except right relex slightly splayed  But bony lm nl and shiny grey drum , Nose: no deformity or discharge  Mouth : OP clear without lesion or edema . Some redness. Teeth in good repair.  Neck: tender ac nodes neg pc  Chest:  Clear to A&P without wheezes rales or rhonchi CV:  S1-S2 no gallops or murmurs peripheral perfusion is normal Skin no acute rashes  Or petechia Abdomen:  Sof,t normal bowel sounds without hepatosplenomegaly, no guarding rebound or masses no CVA tenderness      Assessment & Plan:  Recurrent ear  Pain rx as ear infection although today pain seems to be referred from ac glands. Probable URI  Right tm could have  fluid and eustachian tube dysf but exam not cw severity of  sx Advised caution with repeated antibiotics for  As opposed to getting to the bottom of this. rec she talk with pcp  Dr Jonny Ruiz and perhaps get ent to see again and follow with these recurrent issue.  Rectal bleeding is probably not and enteritis but poss hem or fissure by hx  But disc risk . Problematic with recurrent antibiotic use and risk of developing c diff in future.   Patient declined blood tests  Today and wanted antibiotic again to help her feel better.    Rx given and reviewed above .  She will follow up with Dr Jonny Ruiz  Also rec she get blood work and monitoring and check up with Dr Shela Commons Prolonged visit   Total visit > 50% spent counseling and coordinating care

## 2010-07-16 NOTE — Patient Instructions (Signed)
You have some fluid behind left ear but not an acute otitis I think your pain is coming from tender neck glands.  Today  Poss throat infection that is giving you more pain in your ears.  I rec see ENT doc to  Follow these infections  To see best way to  Prevent recurrent .    Get appt with  Dr Jonny Ruiz about the blood in stool .  Call immediately if you get bloody diarrhea.

## 2010-08-08 ENCOUNTER — Encounter: Payer: Self-pay | Admitting: Gastroenterology

## 2010-08-12 ENCOUNTER — Other Ambulatory Visit: Payer: Self-pay

## 2010-08-12 MED ORDER — ACETAMINOPHEN-CODEINE 300-30 MG PO TABS: 1.0000 | ORAL_TABLET | Freq: Four times a day (QID) | ORAL | Status: DC | PRN

## 2010-08-12 NOTE — Telephone Encounter (Signed)
Rx faxed to pharmacy  

## 2010-09-04 ENCOUNTER — Other Ambulatory Visit: Payer: Self-pay | Admitting: Internal Medicine

## 2010-09-29 LAB — BASIC METABOLIC PANEL
BUN: 7
Chloride: 109
GFR calc non Af Amer: >60
Glucose, Bld: 96
Potassium: 3.7

## 2010-09-29 LAB — URINALYSIS, ROUTINE W REFLEX MICROSCOPIC
Ketones, ur: NEGATIVE
Nitrite: NEGATIVE
Protein, ur: NEGATIVE
pH: 6

## 2010-09-29 LAB — POCT CARDIAC MARKERS
Myoglobin, poc: 56.2
Operator id: 295021
Troponin i, poc: 0.09 — ABNORMAL HIGH

## 2010-09-29 LAB — CBC
HCT: 34.1 — ABNORMAL LOW
MCV: 87
Platelets: 228
RDW: 13.5

## 2010-09-29 LAB — DIFFERENTIAL
Basophils Absolute: 0
Basophils Relative: 1
Eosinophils Absolute: 0.2
Eosinophils Relative: 5

## 2010-09-29 LAB — D-DIMER, QUANTITATIVE: D-Dimer, Quant: <0.22

## 2010-09-30 LAB — CULTURE, BLOOD (ROUTINE X 2): Culture: NO GROWTH

## 2010-09-30 LAB — CBC
Platelets: 261
RDW: 12.8
WBC: 7.3

## 2010-09-30 LAB — COMPREHENSIVE METABOLIC PANEL
ALT: 49 — ABNORMAL HIGH
AST: 51 — ABNORMAL HIGH
Albumin: 4
Alkaline Phosphatase: 119 — ABNORMAL HIGH
Potassium: 4.1
Sodium: 137
Total Protein: 6.5

## 2010-09-30 LAB — URINALYSIS, ROUTINE W REFLEX MICROSCOPIC
Glucose, UA: NEGATIVE
Protein, ur: NEGATIVE

## 2010-09-30 LAB — URINE MICROSCOPIC-ADD ON

## 2010-09-30 LAB — WOUND CULTURE: Culture: NO GROWTH

## 2010-10-03 ENCOUNTER — Other Ambulatory Visit: Payer: Self-pay

## 2010-10-03 MED ORDER — ACETAMINOPHEN-CODEINE 300-30 MG PO TABS: 1.0000 | ORAL_TABLET | Freq: Four times a day (QID) | ORAL | Status: DC | PRN

## 2010-10-03 MED ORDER — ZOLPIDEM TARTRATE 10 MG PO TABS: 10.0000 mg | ORAL_TABLET | Freq: Every evening | ORAL | Status: DC | PRN

## 2010-10-03 NOTE — Telephone Encounter (Signed)
Faxed hardcopy to pharmacy [facility] [address] [place] [phone]

## 2010-10-10 LAB — CBC
HCT: 33.6 — ABNORMAL LOW
MCHC: 34.8
MCHC: 35.3
MCV: 88.1
MCV: 89.8
Platelets: 235
Platelets: 256
RDW: 13.7
RDW: 13.9

## 2010-10-10 LAB — POCT CARDIAC MARKERS
Myoglobin, poc: 72.9
Operator id: 234501

## 2010-10-10 LAB — I-STAT 8, (EC8 V) (CONVERTED LAB)
BUN: 10
Chloride: 106
HCT: 36
Hemoglobin: 12.2
Operator id: 234501
Sodium: 139
pCO2, Ven: 46.7

## 2010-10-10 LAB — CARDIAC PANEL(CRET KIN+CKTOT+MB+TROPI)
CK, MB: 1.9
Relative Index: 1.5
Relative Index: 1.5
Total CK: 189 — ABNORMAL HIGH
Troponin I: 0.01
Troponin I: 0.01

## 2010-10-10 LAB — DIFFERENTIAL
Basophils Absolute: 0
Eosinophils Absolute: 0.2
Lymphocytes Relative: 33
Lymphs Abs: 1.6
Neutrophils Relative %: 56

## 2010-10-10 LAB — D-DIMER, QUANTITATIVE
D-Dimer, Quant: <0.22
D-Dimer, Quant: <0.22

## 2010-10-10 LAB — PROTIME-INR: Prothrombin Time: 13.2

## 2010-10-10 LAB — SEDIMENTATION RATE: Sed Rate: 10

## 2010-10-10 LAB — COMPREHENSIVE METABOLIC PANEL
CO2: 27
Calcium: 9.2
Creatinine, Ser: 0.98
GFR calc non Af Amer: 60 — ABNORMAL LOW
Glucose, Bld: 77
Total Bilirubin: 0.7

## 2010-10-10 LAB — APTT: aPTT: 30

## 2010-10-10 LAB — LIPID PANEL
Cholesterol: 218 — ABNORMAL HIGH
HDL: 45
Triglycerides: 204 — ABNORMAL HIGH

## 2010-10-10 LAB — BASIC METABOLIC PANEL
BUN: 8
Chloride: 108
Creatinine, Ser: 0.7
Glucose, Bld: 100 — ABNORMAL HIGH
Potassium: 3.5

## 2010-10-10 LAB — TROPONIN I: Troponin I: 0.01

## 2010-10-10 LAB — CK TOTAL AND CKMB (NOT AT ARMC): Total CK: 238 — ABNORMAL HIGH

## 2010-10-10 LAB — TSH: TSH: 8.717 — ABNORMAL HIGH

## 2010-10-11 ENCOUNTER — Ambulatory Visit (INDEPENDENT_AMBULATORY_CARE_PROVIDER_SITE_OTHER): Payer: BC Managed Care – PPO | Admitting: Internal Medicine

## 2010-10-11 ENCOUNTER — Encounter: Payer: Self-pay | Admitting: Internal Medicine

## 2010-10-11 VITALS — BP 132/88 | HR 88 | Temp 98.4°F | Ht 66.0 in

## 2010-10-11 DIAGNOSIS — I10 Essential (primary) hypertension: Secondary | ICD-10-CM

## 2010-10-11 DIAGNOSIS — J019 Acute sinusitis, unspecified: Secondary | ICD-10-CM | POA: Insufficient documentation

## 2010-10-11 DIAGNOSIS — F411 Generalized anxiety disorder: Secondary | ICD-10-CM

## 2010-10-11 MED ORDER — CEFTRIAXONE SODIUM 1 G IJ SOLR
1.0000 g | Freq: Once | INTRAMUSCULAR | Status: AC
  Administered 2010-10-11: 1 g via INTRAMUSCULAR

## 2010-10-11 MED ORDER — CEFUROXIME AXETIL 250 MG PO TABS: 250.0000 mg | ORAL_TABLET | Freq: Two times a day (BID) | ORAL | Status: AC

## 2010-10-11 NOTE — Patient Instructions (Addendum)
You had the antibiotic shot today (rocephin) Take all new medications as prescribed - the ceftin Continue all other medications as before Please return about June 2013 with blood work before

## 2010-10-12 LAB — DIFFERENTIAL
Basophils Absolute: 0
Eosinophils Relative: 6 — ABNORMAL HIGH
Lymphocytes Relative: 31
Lymphs Abs: 1.6
Neutro Abs: 2.9
Neutrophils Relative %: 57

## 2010-10-12 LAB — BASIC METABOLIC PANEL
BUN: 7
Calcium: 9.4
Creatinine, Ser: 0.91
GFR calc non Af Amer: >60
Glucose, Bld: 105 — ABNORMAL HIGH

## 2010-10-12 LAB — CBC
HCT: 37.7
Platelets: 259
RDW: 13.2
WBC: 5

## 2010-10-13 ENCOUNTER — Encounter: Payer: Self-pay | Admitting: Internal Medicine

## 2010-10-13 NOTE — Assessment & Plan Note (Signed)
stable overall by hx and exam, most recent data reviewed with pt, and pt to continue medical treatment as before  Lab Results  Component Value Date   WBC 6.1 02/11/2008   HGB 13.6 02/11/2008   HCT 38.2 02/11/2008   PLT 227 02/11/2008   GLUCOSE 104* 02/11/2008   CHOL 155 02/11/2008   TRIG 156* 02/11/2008   HDL 51.3 02/11/2008   LDLDIRECT 162.4 07/16/2007   LDLCALC 73 02/11/2008   ALT 26 02/11/2008   AST 30 02/11/2008   NA 138 02/11/2008   K 3.8 02/11/2008   CL 103 02/11/2008   CREATININE 0.9 02/11/2008   BUN 12 02/11/2008   CO2 28 02/11/2008   TSH 11.54* 02/11/2008   INR 1.0 12/03/2006   HGBA1C  Value: 5.2 (NOTE)   The ADA recommends the following therapeutic goal for glycemic   control related to Hgb A1C measurement:   Goal of Therapy:   < 7.0% Hgb A1C   Reference: American Diabetes Association: Clinical Practice   Recommendations 2008, Diabetes Care,  2008, 31:(Suppl 1). 08/12/2007

## 2010-10-13 NOTE — Assessment & Plan Note (Signed)
stable overall by hx and exam, most recent data reviewed with pt, and pt to continue medical treatment as before  BP Readings from Last 3 Encounters:  10/11/10 132/88  07/16/10 140/100  06/01/10 130/82

## 2010-10-13 NOTE — Progress Notes (Signed)
Subjective:    Patient ID: [name], female    DOB: Aug 17, 1955, 55 y.o.   MRN: [id number]  HPI   Here with 3 days acute onset fever, facial pain, pressure, general weakness and malaise, and greenish d/c, with slight ST, but little to no cough and Pt denies chest pain, increased sob or doe, wheezing, orthopnea, PND, increased LE swelling, palpitations, dizziness or syncope.  Pt denies new neurological symptoms such as new headache, or facial or extremity weakness or numbness  Pt denies polydipsia, polyuria.  Denies worsening depressive symptoms, suicidal ideation, or panic, though has ongoing anxiety, not increased recently.  Past Medical History  Diagnosis Date  . Chronic sinus infection 04/12/2010  . Abdominal pain, epigastric 10/25/2007  . Abdominal pain, right lower quadrant 11/20/2006  . Abdominal pain, right upper quadrant 10/23/2006  . ADD 01/20/2009  . ADIPOSITY, LOCALIZED 10/15/2006  . ALLERGIC RHINITIS 10/15/2006  . ANGIOEDEMA 08/12/2008  . ANIMAL BITE 10/07/2009  . ANXIETY DEPRESSION 04/04/2007  . ANXIETY 10/15/2006  . ASTHMATIC BRONCHITIS, ACUTE 01/27/2008  . Cellulitis and abscess of leg, except foot 08/12/2007  . CELLULITIS, FACE 12/21/2008  . CHEST PAIN 06/14/2007  . COLECTOMY, PARTIAL, WITH ANASTOMOSIS, HX OF 10/15/2006  . COLONIC POLYPS, HX OF 10/15/2006  . Contusion of unspecified site 04/21/2008  . CYST, OVARIAN NEC/NOS 10/15/2006  . Dengue 02/22/2010  . DEPRESSION 10/15/2006  . DVT, HX OF 10/15/2006  . DYSAUTONOMIA 10/15/2006  . Dysfunction of eustachian tube 05/26/2009  . EAR PAIN, BILATERAL 08/16/2007  . GI BLEEDING 04/04/2007  . Glossitis 03/06/2008  . HEADACHE, CHRONIC 04/04/2007  . Hematemesis 10/25/2007  . HEMORRHOIDS 04/04/2007  . HYPERLIPIDEMIA 12/12/2007  . HYPERTENSION 10/15/2006  . HYPOTHYROIDISM 03/06/2008  . IBS 04/04/2007  . MASTECTOMY, BILATERAL, HX OF 08/12/2009  . NECK MASS 07/01/2007  . NEOP, MALIGNANT, FEMALE BREAST NOS 10/15/2006  . NEOP, MALIGNANT,  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  . OTITIS MEDIA, ACUTE, BILATERAL 12/12/2007  . RASH-NONVESICULAR 02/13/2008  . RECTAL BLEEDING 04/04/2007  . SHY-DRAGER SYNDROME 01/17/2007  . SINUSITIS- ACUTE-NOS 10/15/2006  . SYNCOPE 07/01/2007  . URI 07/29/2007  . VERTIGO 01/17/2007  . VIRAL INFECTION 02/17/2010  . VITAMIN D DEFICIENCY 03/06/2008  . Wheezing 08/20/2008  . WOUND, OPEN, LEG, WITHOUT COMPLICATION 08/16/2007   Past Surgical History  Procedure Date  . Abdominal hysterectomy   . Oophorectomy   . Cholecystectomy   . Spigellian hernia   . Multiple ablations   . Bilateral mastectomy/reconstruction   . Cesarean section     reports that she has never smoked. She does not have any smokeless tobacco history on file. She reports that she does not drink alcohol or use illicit drugs. family history includes Cancer in her sister. Allergies  Allergen Reactions  . Penicillins     REACTION: shock   Current Outpatient Prescriptions on File Prior to Visit  Medication Sig Dispense Refill  . Acetaminophen-Codeine (TYLENOL/CODEINE #3) 300-30 MG per tablet Take 1 tablet by mouth 4 (four) times daily as needed for pain.  120 tablet  1  . celecoxib (CELEBREX) 200 MG capsule Take 200 mg by mouth 2 (two) times daily.        . fexofenadine (ALLEGRA) 180 MG tablet Take 180 mg by mouth daily.        . fluticasone (FLONASE) 50 MCG/ACT nasal spray Place 2 sprays into the nose daily.  16 g  5  . levothyroxine (SYNTHROID, LEVOTHROID) 50 MCG tablet Take 1 tablet (50 mcg total) by mouth daily.  90  tablet  3  . losartan (COZAAR) 100 MG tablet TAKE 1 TABLET EVERY DAY  30 tablet  10  . methylphenidate (CONCERTA) 36 MG CR tablet Take 1 tablet (36 mg total) by mouth daily. To fill July 31, 2010  30 tablet  0  . venlafaxine (EFFEXOR-XR) 150 MG 24 hr capsule Take 150 mg by mouth daily.        [name] zolpidem (AMBIEN) 10 MG tablet Take 1 tablet (10 mg total) by mouth at bedtime as needed.  30 tablet  5   Review of Systems Review of Systems    Constitutional: Negative for diaphoresis and unexpected weight change.  HENT: Negative for drooling and tinnitus.   Eyes: Negative for photophobia and visual disturbance.  Respiratory: Negative for choking and stridor.   Gastrointestinal: Negative for vomiting and blood in stool.  Genitourinary: Negative for hematuria and decreased urine volume.        Objective:   Physical Exam BP 132/88  Pulse 88  Temp(Src) 98.4 F (36.9 C) (Oral)  Ht 5\' 6"  (1.676 m)  SpO2 98% Physical Exam  VS noted, mild ill Constitutional: Pt appears well-developed and well-nourished.  HENT: Head: Normocephalic.  Right Ear: External ear normal.  Left Ear: External ear normal.  Bilat tm's mild erythema.  Sinus tender bilat.  Pharynx mild erythema Eyes: Conjunctivae and EOM are normal. Pupils are equal, round, and reactive to light.  Neck: Normal range of motion. Neck supple.  Cardiovascular: Normal rate and regular rhythm.   Pulmonary/Chest: Effort normal and breath sounds normal.  Neurological: Pt is alert. No cranial nerve deficit.  Skin: Skin is warm. No erythema.  Psychiatric: Pt behavior is normal. Thought content normal. 1+ nervous, not depressed affect    Assessment & Plan:

## 2010-10-13 NOTE — Assessment & Plan Note (Signed)
Mild to mod, for antibx course,  to f/u any worsening symptoms or concerns 

## 2010-10-21 ENCOUNTER — Other Ambulatory Visit: Payer: Self-pay | Admitting: Internal Medicine

## 2010-10-24 ENCOUNTER — Other Ambulatory Visit: Payer: Self-pay

## 2010-10-24 MED ORDER — CEFUROXIME AXETIL 250 MG PO TABS: 250.0000 mg | ORAL_TABLET | Freq: Two times a day (BID) | ORAL | Status: AC

## 2010-10-24 NOTE — Telephone Encounter (Signed)
Patient informed. 

## 2010-10-24 NOTE — Telephone Encounter (Signed)
Patient is requesting a refill on her antibiotic for ear infections she had in October. She is leaving for [place] in the morning and concerned about the flight, would like antibiotics on hand for the trip.

## 2010-10-24 NOTE — Telephone Encounter (Signed)
Done per emr, to use sparingly, if at all

## 2010-11-23 ENCOUNTER — Other Ambulatory Visit: Payer: Self-pay | Admitting: Internal Medicine

## 2010-11-30 ENCOUNTER — Other Ambulatory Visit: Payer: Self-pay

## 2010-11-30 MED ORDER — ACETAMINOPHEN-CODEINE 300-30 MG PO TABS: 1.0000 | ORAL_TABLET | Freq: Four times a day (QID) | ORAL | Status: DC | PRN

## 2010-11-30 NOTE — Telephone Encounter (Signed)
Faxed hardcopy to [facility] 

## 2010-12-19 ENCOUNTER — Ambulatory Visit (INDEPENDENT_AMBULATORY_CARE_PROVIDER_SITE_OTHER): Payer: BC Managed Care – PPO | Admitting: Internal Medicine

## 2010-12-19 ENCOUNTER — Encounter: Payer: Self-pay | Admitting: Internal Medicine

## 2010-12-19 VITALS — BP 110/72 | HR 89 | Temp 98.6°F

## 2010-12-19 DIAGNOSIS — F988 Other specified behavioral and emotional disorders with onset usually occurring in childhood and adolescence: Secondary | ICD-10-CM

## 2010-12-19 DIAGNOSIS — I10 Essential (primary) hypertension: Secondary | ICD-10-CM

## 2010-12-19 DIAGNOSIS — J019 Acute sinusitis, unspecified: Secondary | ICD-10-CM

## 2010-12-19 MED ORDER — CEFUROXIME AXETIL 250 MG PO TABS: 250.0000 mg | ORAL_TABLET | Freq: Two times a day (BID) | ORAL | Status: AC

## 2010-12-19 MED ORDER — LOSARTAN POTASSIUM 100 MG PO TABS: 100.0000 mg | ORAL_TABLET | Freq: Every day | ORAL | Status: DC

## 2010-12-19 NOTE — Assessment & Plan Note (Signed)
stable overall by hx and exam, most recent data reviewed with pt, and pt to continue medical treatment as before  BP Readings from Last 3 Encounters:  12/19/10 110/72  10/11/10 132/88  07/16/10 140/100

## 2010-12-19 NOTE — Patient Instructions (Addendum)
Take all new medications as prescribed Continue all other medications as before Your medicatin was refilled as you requested OK to take the synthroid as discussed Please return in 6 mo with Lab testing done 3-5 days before

## 2010-12-19 NOTE — Assessment & Plan Note (Signed)
Mild to mod, for antibx course,  to f/u any worsening symptoms or concerns 

## 2010-12-19 NOTE — Progress Notes (Signed)
Subjective:    Patient ID: [name], female    DOB: 1955-09-29, 55 y.o.   MRN: [id number]  HPI  Here with 3 days acute onset fever, facial pain, pressure, general weakness and malaise, and greenish d/c, with slight ST, but little to no cough and Pt denies chest pain, increased sob or doe, wheezing, orthopnea, PND, increased LE swelling, palpitations, dizziness or syncope.  Pt denies new neurological symptoms such as new headache, or facial or extremity weakness or numbness   Pt denies polydipsia, polyuria.  ADD med working well with good concentration, task completion, no apparent side effect, wt loss Past Medical History  Diagnosis Date  . Chronic sinus infection 04/12/2010  . Abdominal pain, epigastric 10/25/2007  . Abdominal pain, right lower quadrant 11/20/2006  . Abdominal pain, right upper quadrant 10/23/2006  . ADD 01/20/2009  . ADIPOSITY, LOCALIZED 10/15/2006  . ALLERGIC RHINITIS 10/15/2006  . ANGIOEDEMA 08/12/2008  . ANIMAL BITE 10/07/2009  . ANXIETY DEPRESSION 04/04/2007  . ANXIETY 10/15/2006  . ASTHMATIC BRONCHITIS, ACUTE 01/27/2008  . Cellulitis and abscess of leg, except foot 08/12/2007  . CELLULITIS, FACE 12/21/2008  . CHEST PAIN 06/14/2007  . COLECTOMY, PARTIAL, WITH ANASTOMOSIS, HX OF 10/15/2006  . COLONIC POLYPS, HX OF 10/15/2006  . Contusion of unspecified site 04/21/2008  . CYST, OVARIAN NEC/NOS 10/15/2006  . Dengue 02/22/2010  . DEPRESSION 10/15/2006  . DVT, HX OF 10/15/2006  . DYSAUTONOMIA 10/15/2006  . Dysfunction of eustachian tube 05/26/2009  . EAR PAIN, BILATERAL 08/16/2007  . GI BLEEDING 04/04/2007  . Glossitis 03/06/2008  . HEADACHE, CHRONIC 04/04/2007  . Hematemesis 10/25/2007  . HEMORRHOIDS 04/04/2007  . HYPERLIPIDEMIA 12/12/2007  . HYPERTENSION 10/15/2006  . HYPOTHYROIDISM 03/06/2008  . IBS 04/04/2007  . MASTECTOMY, BILATERAL, HX OF 08/12/2009  . NECK MASS 07/01/2007  . NEOP, MALIGNANT, FEMALE BREAST NOS 10/15/2006  . 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 OTITIS  MEDIA, ACUTE, BILATERAL 12/12/2007  . RASH-NONVESICULAR 02/13/2008  . RECTAL BLEEDING 04/04/2007  . SHY-DRAGER SYNDROME 01/17/2007  . SINUSITIS- ACUTE-NOS 10/15/2006  . SYNCOPE 07/01/2007  . URI 07/29/2007  . VERTIGO 01/17/2007  . VIRAL INFECTION 02/17/2010  . VITAMIN D DEFICIENCY 03/06/2008  . Wheezing 08/20/2008  . WOUND, OPEN, LEG, WITHOUT COMPLICATION 08/16/2007   Past Surgical History  Procedure Date  . Abdominal hysterectomy   . Oophorectomy   . Cholecystectomy   . Spigellian hernia   . Multiple ablations   . Bilateral mastectomy/reconstruction   . Cesarean section     reports that she has never smoked. She does not have any smokeless tobacco history on file. She reports that she does not drink alcohol or use illicit drugs. family history includes Cancer in her sister. Allergies  Allergen Reactions  . Penicillins     REACTION: shock   Current Outpatient Prescriptions on File Prior to Visit  Medication Sig Dispense Refill  . Acetaminophen-Codeine (TYLENOL/CODEINE #3) 300-30 MG per tablet Take 1 tablet by mouth 4 (four) times daily as needed for pain. To fill Dec 02, 2010  120 tablet  2  . celecoxib (CELEBREX) 200 MG capsule Take 200 mg by mouth 2 (two) times daily.        . fexofenadine (ALLEGRA) 180 MG tablet Take 180 mg by mouth daily.        . fluticasone (FLONASE) 50 MCG/ACT nasal spray Place 2 sprays into the nose daily.  16 g  5  . levothyroxine (SYNTHROID, LEVOTHROID) 50 MCG tablet Take 1 tablet (50 mcg total) by  mouth daily.  90 tablet  3  . methylphenidate (CONCERTA) 36 MG CR tablet Take 1 tablet (36 mg total) by mouth daily. To fill July 31, 2010  30 tablet  0  . venlafaxine (EFFEXOR-XR) 150 MG 24 hr capsule TAKE 1 CAPSULE EVERY DAY  30 capsule  5  . zolpidem (AMBIEN) 10 MG tablet Take 1 tablet (10 mg total) by mouth at bedtime as needed.  30 tablet  5   Review of Systems Review of Systems  Constitutional: Negative for diaphoresis and unexpected weight change.  HENT:  Negative for drooling and tinnitus.   Eyes: Negative for photophobia and visual disturbance.  Respiratory: Negative for choking and stridor.   Gastrointestinal: Negative for vomiting and blood in stool.  Genitourinary: Negative for hematuria and decreased urine volume.  Psychiatric/Behavioral: Negative for decreased concentration. The patient is not hyperactive.       Objective:   Physical Exam BP 110/72  Pulse 89  Temp(Src) 98.6 F (37 C) (Oral)  SpO2 97% Physical Exam  VS noted, mild ill Constitutional: Pt appears well-developed and well-nourished.  HENT: Head: Normocephalic.  Right Ear: External ear normal.  Left Ear: External ear normal.  Bilat tm's mild erythema.  Sinus tender bilat.  Pharynx mild erythema Eyes: Conjunctivae and EOM are normal. Pupils are equal, round, and reactive to light.  Neck: Normal range of motion. Neck supple.  Cardiovascular: Normal rate and regular rhythm.   Pulmonary/Chest: Effort normal and breath sounds normal.  Neurological: Pt is alert. No cranial nerve deficit.  Skin: Skin is warm. No erythema.  Psychiatric: Pt behavior is normal. Thought content normal.         Assessment & Plan:

## 2010-12-19 NOTE — Assessment & Plan Note (Signed)
stable overall by hx and exam, most recent data reviewed with pt, and pt to continue medical treatment as before Continue all other medications as before

## 2011-01-02 ENCOUNTER — Other Ambulatory Visit: Payer: Self-pay | Admitting: Internal Medicine

## 2011-01-09 ENCOUNTER — Telehealth: Payer: Self-pay

## 2011-01-09 MED ORDER — CEFUROXIME AXETIL 250 MG PO TABS: 250.0000 mg | ORAL_TABLET | Freq: Two times a day (BID) | ORAL | Status: AC

## 2011-01-09 NOTE — Telephone Encounter (Signed)
Done per emr 

## 2011-01-09 NOTE — Telephone Encounter (Signed)
Patient was seen on 12/19/10 and given antibiotic for her ears/sinus. She states it usually takes 2 rounds of antibiotics and is requesting a refill as still having ear and sinus problems. [facility], call back number is [phone]

## 2011-01-09 NOTE — Telephone Encounter (Signed)
Called left message that  Prescription was sent to pharmacy

## 2011-02-21 ENCOUNTER — Ambulatory Visit (INDEPENDENT_AMBULATORY_CARE_PROVIDER_SITE_OTHER): Payer: BC Managed Care – PPO | Admitting: Internal Medicine

## 2011-02-21 ENCOUNTER — Encounter: Payer: Self-pay | Admitting: Internal Medicine

## 2011-02-21 VITALS — BP 120/80 | HR 87 | Temp 97.6°F

## 2011-02-21 DIAGNOSIS — J019 Acute sinusitis, unspecified: Secondary | ICD-10-CM

## 2011-02-21 DIAGNOSIS — F411 Generalized anxiety disorder: Secondary | ICD-10-CM | POA: Diagnosis not present

## 2011-02-21 DIAGNOSIS — I10 Essential (primary) hypertension: Secondary | ICD-10-CM

## 2011-02-21 MED ORDER — CEFUROXIME AXETIL 250 MG PO TABS: 250.0000 mg | ORAL_TABLET | Freq: Two times a day (BID) | ORAL | Status: AC

## 2011-02-21 NOTE — Patient Instructions (Addendum)
Take all new medications as prescribed Continue all other medications as before You can also take Delsym OTC for cough, and/or Mucinex (or it's generic off brand) for congestion Please return in June 2013 with Lab testing done 3-5 days before

## 2011-02-25 ENCOUNTER — Encounter: Payer: Self-pay | Admitting: Internal Medicine

## 2011-02-25 NOTE — Assessment & Plan Note (Signed)
stable overall by hx and exam, most recent data reviewed with pt, and pt to continue medical treatment as before  Lab Results  Component Value Date   WBC 6.1 02/11/2008   HGB 13.6 02/11/2008   HCT 38.2 02/11/2008   PLT 227 02/11/2008   GLUCOSE 104* 02/11/2008   CHOL 155 02/11/2008   TRIG 156* 02/11/2008   HDL 51.3 02/11/2008   LDLDIRECT 162.4 07/16/2007   LDLCALC 73 02/11/2008   ALT 26 02/11/2008   AST 30 02/11/2008   NA 138 02/11/2008   K 3.8 02/11/2008   CL 103 02/11/2008   CREATININE 0.9 02/11/2008   BUN 12 02/11/2008   CO2 28 02/11/2008   TSH 11.54* 02/11/2008   INR 1.0 12/03/2006   HGBA1C  Value: 5.2 (NOTE)   The ADA recommends the following therapeutic goal for glycemic   control related to Hgb A1C measurement:   Goal of Therapy:   < 7.0% Hgb A1C   Reference: American Diabetes Association: Clinical Practice   Recommendations 2008, Diabetes Care,  2008, 31:(Suppl 1). 08/12/2007    

## 2011-02-25 NOTE — Assessment & Plan Note (Signed)
stable overall by hx and exam, most recent data reviewed with pt, and pt to continue medical treatment as before  BP Readings from Last 3 Encounters:  02/21/11 120/80  12/19/10 110/72  10/11/10 132/88

## 2011-02-25 NOTE — Progress Notes (Signed)
Subjective:    Patient ID: [name], female    DOB: 05-31-1955, 56 y.o.   MRN: [id number]  HPI   Here with 3 wks acute onset fever, facial pain, pressure, general weakness and malaise, and greenish d/c, with slight ST, but little to no cough and Pt denies chest pain, increased sob or doe, wheezing, orthopnea, PND, increased LE swelling, palpitations, dizziness or syncope. Has some radiation of pain to bilat ears.  Pt denies new neurological symptoms such as new headache, or facial or extremity weakness or numbness   Pt denies polydipsia, polyuria.   Pt denies fever, wt loss, night sweats, loss of appetite, or other constitutional symptoms except for the above.  Denies worsening depressive symptoms, suicidal ideation, or panic, though has ongoing anxiety, not increased recently.  Past Medical History  Diagnosis Date  . Chronic sinus infection 04/12/2010  . Abdominal pain, epigastric 10/25/2007  . Abdominal pain, right lower quadrant 11/20/2006  . Abdominal pain, right upper quadrant 10/23/2006  . ADD 01/20/2009  . ADIPOSITY, LOCALIZED 10/15/2006  . ALLERGIC RHINITIS 10/15/2006  . ANGIOEDEMA 08/12/2008  . ANIMAL BITE 10/07/2009  . ANXIETY DEPRESSION 04/04/2007  . ANXIETY 10/15/2006  . ASTHMATIC BRONCHITIS, ACUTE 01/27/2008  . Cellulitis and abscess of leg, except foot 08/12/2007  . CELLULITIS, FACE 12/21/2008  . CHEST PAIN 06/14/2007  . COLECTOMY, PARTIAL, WITH ANASTOMOSIS, HX OF 10/15/2006  . COLONIC POLYPS, HX OF 10/15/2006  . Contusion of unspecified site 04/21/2008  . CYST, OVARIAN NEC/NOS 10/15/2006  . Dengue 02/22/2010  . DEPRESSION 10/15/2006  . DVT, HX OF 10/15/2006  . DYSAUTONOMIA 10/15/2006  . Dysfunction of eustachian tube 05/26/2009  . EAR PAIN, BILATERAL 08/16/2007  . GI BLEEDING 04/04/2007  . Glossitis 03/06/2008  . HEADACHE, CHRONIC 04/04/2007  . Hematemesis 10/25/2007  . HEMORRHOIDS 04/04/2007  . HYPERLIPIDEMIA 12/12/2007  . HYPERTENSION 10/15/2006  . HYPOTHYROIDISM 03/06/2008  .  IBS 04/04/2007  . MASTECTOMY, BILATERAL, HX OF 08/12/2009  . NECK MASS 07/01/2007  . NEOP, MALIGNANT, FEMALE BREAST NOS 10/15/2006  . 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 OTITIS MEDIA, ACUTE, BILATERAL 12/12/2007  . RASH-NONVESICULAR 02/13/2008  . RECTAL BLEEDING 04/04/2007  . SHY-DRAGER SYNDROME 01/17/2007  . SINUSITIS- ACUTE-NOS 10/15/2006  . SYNCOPE 07/01/2007  . URI 07/29/2007  . VERTIGO 01/17/2007  . VIRAL INFECTION 02/17/2010  . VITAMIN D DEFICIENCY 03/06/2008  . Wheezing 08/20/2008  . WOUND, OPEN, LEG, WITHOUT COMPLICATION 08/16/2007   Past Surgical History  Procedure Date  . Abdominal hysterectomy   . Oophorectomy   . Cholecystectomy   . Spigellian hernia   . Multiple ablations   . Bilateral mastectomy/reconstruction   . Cesarean section     reports that she has never smoked. She does not have any smokeless tobacco history on file. She reports that she does not drink alcohol or use illicit drugs. family history includes Cancer in her sister. Allergies  Allergen Reactions  . Penicillins     REACTION: shock   Current Outpatient Prescriptions on File Prior to Visit  Medication Sig Dispense Refill  . Acetaminophen-Codeine (TYLENOL/CODEINE #3) 300-30 MG per tablet Take 1 tablet by mouth 4 (four) times daily as needed for pain. To fill Dec 02, 2010  120 tablet  2  . celecoxib (CELEBREX) 200 MG capsule Take 200 mg by mouth 2 (two) times daily.        . fexofenadine (ALLEGRA) 180 MG tablet Take 180 mg by mouth daily.        . fluticasone (FLONASE) 50  MCG/ACT nasal spray Place 2 sprays into the nose daily.  16 g  5  . levothyroxine (SYNTHROID, LEVOTHROID) 50 MCG tablet Take 1 tablet (50 mcg total) by mouth daily.  90 tablet  3  . losartan (COZAAR) 100 MG tablet Take 1 tablet (100 mg total) by mouth daily.  90 tablet  3  . methylphenidate (CONCERTA) 36 MG CR tablet Take 1 tablet (36 mg total) by mouth daily. To fill July 31, 2010  30 tablet  0  . venlafaxine (EFFEXOR-XR) 150 MG 24 hr  capsule TAKE 1 CAPSULE EVERY DAY  30 capsule  5  . zolpidem (AMBIEN) 10 MG tablet Take 1 tablet (10 mg total) by mouth at bedtime as needed.  30 tablet  5   Review of Systems Review of Systems  Constitutional: Negative for diaphoresis and unexpected weight change.  HENT: Negative for drooling and tinnitus.   Eyes: Negative for photophobia and visual disturbance.  Respiratory: Negative for choking and stridor.   Gastrointestinal: Negative for vomiting and blood in stool.  Genitourinary: Negative for hematuria and decreased urine volume.    Objective:   Physical Exam BP 120/80  Pulse 87  Temp(Src) 97.6 F (36.4 C) (Oral)  SpO2 96% Physical Exam  VS noted, mild ill Constitutional: Pt appears well-developed and well-nourished.  HENT: Head: Normocephalic.  Right Ear: External ear normal.  Left Ear: External ear normal.  Bilat tm's mild erythema.  Sinus tender right max > left .  Pharynx mild erythema Eyes: Conjunctivae and EOM are normal. Pupils are equal, round, and reactive to light.  Neck: Normal range of motion. Neck supple.  Cardiovascular: Normal rate and regular rhythm.   Pulmonary/Chest: Effort normal and breath sounds normal.  Neurological: Pt is alert. No cranial nerve deficit.  Skin: Skin is warm. No erythema.  Psychiatric: Pt behavior is normal. Thought content normal. 1+ nervous, not depressed affect    Assessment & Plan:

## 2011-02-25 NOTE — Assessment & Plan Note (Signed)
Mild to mod, for antibx course,  to f/u any worsening symptoms or concerns 

## 2011-03-06 ENCOUNTER — Other Ambulatory Visit: Payer: Self-pay

## 2011-03-06 MED ORDER — ACETAMINOPHEN-CODEINE 300-30 MG PO TABS: 1.0000 | ORAL_TABLET | Freq: Four times a day (QID) | ORAL | Status: DC | PRN

## 2011-03-06 NOTE — Telephone Encounter (Signed)
Faxed hardcopy to pharmacy. 

## 2011-03-10 ENCOUNTER — Telehealth: Payer: Self-pay

## 2011-03-10 ENCOUNTER — Ambulatory Visit: Payer: BC Managed Care – PPO | Admitting: Internal Medicine

## 2011-03-10 MED ORDER — CEFUROXIME AXETIL 250 MG PO TABS: 250.0000 mg | ORAL_TABLET | Freq: Two times a day (BID) | ORAL | Status: AC

## 2011-03-10 NOTE — Telephone Encounter (Signed)
Ok for 7 more days  - done excript  Ok to hold on coming for appt today

## 2011-03-10 NOTE — Telephone Encounter (Signed)
The patient was seen 2 weeks ago with ear infection and sore throat. She completed the Ceftin, but needs another prescription to completely clear it all up.  She has scheduled an appointment at 4 today, but thinks another round of antibiotics would take care of. Please advise if able to call something in or if the patient should keep the appointment.

## 2011-03-10 NOTE — Telephone Encounter (Signed)
Patient informed prescription requested sent in and ok to cancel appointment.

## 2011-04-01 ENCOUNTER — Ambulatory Visit (INDEPENDENT_AMBULATORY_CARE_PROVIDER_SITE_OTHER): Payer: BC Managed Care – PPO | Admitting: Family Medicine

## 2011-04-01 ENCOUNTER — Encounter: Payer: Self-pay | Admitting: Family Medicine

## 2011-04-01 VITALS — BP 130/78 | Temp 97.9°F

## 2011-04-01 DIAGNOSIS — H9203 Otalgia, bilateral: Secondary | ICD-10-CM

## 2011-04-01 DIAGNOSIS — H9209 Otalgia, unspecified ear: Secondary | ICD-10-CM

## 2011-04-01 NOTE — Patient Instructions (Signed)
Barotitis Media Barotitis media is soreness (inflammation) of the area behind the eardrum (middle ear). This occurs when the auditory tube (Eustachian tube) leading from the back of the throat to the eardrum is blocked. When it is blocked air cannot move in and out of the middle ear to equalize pressure changes. These pressure changes come from changes in altitude when:  Flying.   Driving in the mountains.   Diving.  Problems are more likely to occur with pressure changes during times when you are congested as from:  Hay fever.   Upper respiratory infection.   A cold.  Damage or hearing loss (barotrauma) caused by this may be permanent. HOME CARE INSTRUCTIONS   Use medicines as recommended by your caregiver. Over the counter medicines will help unblock the canal and can help during times of air travel.   Do not put anything into your ears to clean or unplug them. Eardrops will not be helpful.   Do not swim, dive, or fly until your caregiver says it is all right to do so. If these activities are necessary, chewing gum with frequent swallowing may help. It is also helpful to hold your nose and gently blow to pop your ears for equalizing pressure changes. This forces air into the Eustachian tube.   For little ones with problems, give your baby a bottle of water or juice during periods when pressure changes would be anticipated such as during take offs and landings associated with air travel.   Only take over-the-counter or prescription medicines for pain, discomfort, or fever as directed by your caregiver.   A decongestant may be helpful in de-congesting the middle ear and make pressure equalization easier. This can be even more effective if the drops (spray) are delivered with the head lying over the edge of a bed with the head tilted toward the ear on the affected side.   If your caregiver has given you a follow-up appointment, it is very important to keep that appointment. Not keeping  the appointment could result in a chronic or permanent injury, pain, hearing loss and disability. If there is any problem keeping the appointment, you must call back to this facility for assistance.  SEEK IMMEDIATE MEDICAL CARE IF:   You develop a severe headache, dizziness, severe ear pain, or bloody or pus-like drainage from your ears.   An oral temperature above 102 F (38.9 C) develops.   Your problems do not improve or become worse.  MAKE SURE YOU:   Understand these instructions.   Will watch your condition.   Will get help right away if you are not doing well or get worse.  Document Released: 12/17/1999 Document Revised: 12/08/2010 Document Reviewed: 07/25/2007 Exit[employer] Patient Information 2012 [employer]. 

## 2011-04-01 NOTE — Progress Notes (Signed)
  Subjective:    Patient ID: [name], female    DOB: [birth year], 56 y.o.   MRN: [id number]  HPI  Acute visit. 4-5 day history of bilateral ear pain left greater than right. No drainage. No vertigo. no nasal congestion. She flew last week but denied any associated pain. She has no fever or chills. No nasal congestion. History of recurrent sinusitis the past but denies any current facial pain or headache. No sore throat.  Review of Systems As per history of present illness    Objective:   Physical Exam  Constitutional: She appears well-developed and well-nourished.  HENT:  Right Ear: External ear normal.  Left Ear: External ear normal.  Mouth/Throat: Oropharynx is clear and moist.  Neck: Neck supple.  Cardiovascular: Normal rate and regular rhythm.   Pulmonary/Chest: Effort normal and breath sounds normal. No respiratory distress. She has no wheezes. She has no rales.  Lymphadenopathy:    She has no cervical adenopathy.          Assessment & Plan:  Bilateral otalgia. Question eustachian tube dysfunction. No evidence for infection. Reassurance and observation

## 2011-04-22 ENCOUNTER — Other Ambulatory Visit: Payer: Self-pay | Admitting: Internal Medicine

## 2011-05-11 ENCOUNTER — Telehealth: Payer: Self-pay

## 2011-05-11 NOTE — Telephone Encounter (Signed)
I will forward to [facility] to see what options are  Corrie Dandy, do you or Debr know of a plastic surgeon who takes medicare, for breast implant replacement surgury?

## 2011-05-11 NOTE — Telephone Encounter (Signed)
The patient is requesting a referral to a plastic surgeon to change saline bags. She was seen in 2000 by Dr. Venita Sheffield, but she does not process Medicare.  The patient is requesting a referral to a surgeon who does please advise.

## 2011-05-12 NOTE — Telephone Encounter (Signed)
DrJohn , Visual merchandiser do note take  medicare

## 2011-05-15 NOTE — Telephone Encounter (Signed)
Robin to contact pt- I think a breast surgeon with the general surgeons could help her, I can refer if she wants  Plastic surgeons in [place] apparently do not take medicare

## 2011-05-16 NOTE — Telephone Encounter (Signed)
To [name] to notify pt, see notes below

## 2011-05-16 NOTE — Telephone Encounter (Signed)
My understanding is that medicare does not require paper referrals  Corrie Dandy to confirm, but pt should be able to make her own appt if she already knows who to see

## 2011-05-16 NOTE — Telephone Encounter (Signed)
Called the patient and she found a Engineer, petroleum in [place]  Who takes medicare.  Please send a referral for the patient his name is Dr. Yehuda Mao in [facility]. The patient already has an appointment with him this coming Thursday May 18, 2011. Thanks

## 2011-05-16 NOTE — Telephone Encounter (Signed)
Dr Jonny Ruiz pt does not need referral as far as I know. Also she is scheduled to have surgery on 05/23/11 with Dr Chales Abrahams Contogiannis

## 2011-05-17 NOTE — Telephone Encounter (Signed)
Patient informed. 

## 2011-05-18 DIAGNOSIS — D4860 Neoplasm of uncertain behavior of unspecified breast: Secondary | ICD-10-CM | POA: Diagnosis not present

## 2011-05-23 ENCOUNTER — Encounter (HOSPITAL_BASED_OUTPATIENT_CLINIC_OR_DEPARTMENT_OTHER): Admission: RE | Payer: Self-pay | Source: Ambulatory Visit

## 2011-05-23 ENCOUNTER — Ambulatory Visit (HOSPITAL_BASED_OUTPATIENT_CLINIC_OR_DEPARTMENT_OTHER): Admission: RE | Admit: 2011-05-23 | Payer: BC Managed Care – PPO | Source: Ambulatory Visit | Admitting: Plastic Surgery

## 2011-05-23 SURGERY — MAMMOPLASTY, REDUCTION
Anesthesia: General | Laterality: Bilateral

## 2011-06-01 DIAGNOSIS — H2510 Age-related nuclear cataract, unspecified eye: Secondary | ICD-10-CM | POA: Diagnosis not present

## 2011-06-01 DIAGNOSIS — H43819 Vitreous degeneration, unspecified eye: Secondary | ICD-10-CM | POA: Diagnosis not present

## 2011-06-01 DIAGNOSIS — H33319 Horseshoe tear of retina without detachment, unspecified eye: Secondary | ICD-10-CM | POA: Diagnosis not present

## 2011-06-01 DIAGNOSIS — H25019 Cortical age-related cataract, unspecified eye: Secondary | ICD-10-CM | POA: Diagnosis not present

## 2011-06-08 ENCOUNTER — Other Ambulatory Visit: Payer: Self-pay | Admitting: Internal Medicine

## 2011-06-08 DIAGNOSIS — H43819 Vitreous degeneration, unspecified eye: Secondary | ICD-10-CM | POA: Diagnosis not present

## 2011-06-08 DIAGNOSIS — H4310 Vitreous hemorrhage, unspecified eye: Secondary | ICD-10-CM | POA: Diagnosis not present

## 2011-06-29 ENCOUNTER — Ambulatory Visit (INDEPENDENT_AMBULATORY_CARE_PROVIDER_SITE_OTHER): Payer: BC Managed Care – PPO | Admitting: Internal Medicine

## 2011-06-29 ENCOUNTER — Encounter: Payer: Self-pay | Admitting: Internal Medicine

## 2011-06-29 VITALS — BP 110/80 | HR 84 | Temp 97.0°F

## 2011-06-29 DIAGNOSIS — Z0000 Encounter for general adult medical examination without abnormal findings: Secondary | ICD-10-CM | POA: Diagnosis not present

## 2011-06-29 DIAGNOSIS — J019 Acute sinusitis, unspecified: Secondary | ICD-10-CM

## 2011-06-29 DIAGNOSIS — J309 Allergic rhinitis, unspecified: Secondary | ICD-10-CM | POA: Diagnosis not present

## 2011-06-29 MED ORDER — CEFUROXIME AXETIL 250 MG PO TABS: 250.0000 mg | ORAL_TABLET | Freq: Two times a day (BID) | ORAL | Status: AC

## 2011-06-29 MED ORDER — FLUTICASONE PROPIONATE 50 MCG/ACT NA SUSP: 2.0000 | Freq: Every day | NASAL | Status: DC

## 2011-06-29 MED ORDER — FEXOFENADINE HCL 180 MG PO TABS: 180.0000 mg | ORAL_TABLET | Freq: Every day | ORAL | Status: DC

## 2011-06-29 NOTE — Patient Instructions (Addendum)
Take all new medications as prescribed Continue all other medications as before Please have the pharmacy call with any refills you may need. Please continue your efforts at being more active, low cholesterol diet, and weight control. You are other wise up to date with prevention Please go to LAB in the Basement for the blood and/or urine tests to be done today You will be contacted by phone if any changes need to be made immediately.  Otherwise, you will receive a letter about your results with an explanation. Please return in 1 year for your yearly visit, or sooner if needed, with Lab testing done 3-5 days before

## 2011-06-29 NOTE — Assessment & Plan Note (Signed)

## 2011-07-01 ENCOUNTER — Encounter: Payer: Self-pay | Admitting: Internal Medicine

## 2011-07-01 NOTE — Assessment & Plan Note (Signed)
Mild to mod, for antibx course,  to f/u any worsening symptoms or concerns 

## 2011-07-01 NOTE — Assessment & Plan Note (Signed)
.  Mild to mod, for med restart, to f/u any worsening symptoms or concerns 

## 2011-07-01 NOTE — Progress Notes (Signed)
Subjective:    Patient ID: [name], female    DOB: 1955/06/15, 56 y.o.   MRN: [id number]  HPI  Here for wellness and f/u;  Overall doing ok;  Pt denies CP, worsening SOB, DOE, wheezing, orthopnea, PND, worsening LE edema, palpitations, dizziness or syncope.  Pt denies neurological change such as new Headache, facial or extremity weakness.  Pt denies polydipsia, polyuria, or low sugar symptoms. Pt states overall good compliance with treatment and medications, good tolerability, and trying to follow lower cholesterol diet.  Pt denies worsening depressive symptoms, suicidal ideation or panic. No fever, wt loss, night sweats, loss of appetite, or other constitutional symptoms.  Pt states good ability with ADL's, low fall risk, home safety reviewed and adequate, no significant changes in hearing or vision, and occasionally active with exercise.   Here with 3 days acute onset fever, facial pain, pressure, general weakness and malaise, and greenish d/c, with slight ST, but little to no cough.  Does have several wks ongoing nasal allergy symptoms with clear congestion, itch and sneeze, without fever, pain, ST, cough or wheezing, has not been suing her med Past Medical History  Diagnosis Date  . Chronic sinus infection 04/12/2010  . Abdominal pain, epigastric 10/25/2007  . Abdominal pain, right lower quadrant 11/20/2006  . Abdominal pain, right upper quadrant 10/23/2006  . ADD 01/20/2009  . ADIPOSITY, LOCALIZED 10/15/2006  . ALLERGIC RHINITIS 10/15/2006  . ANGIOEDEMA 08/12/2008  . ANIMAL BITE 10/07/2009  . ANXIETY DEPRESSION 04/04/2007  . ANXIETY 10/15/2006  . ASTHMATIC BRONCHITIS, ACUTE 01/27/2008  . Cellulitis and abscess of leg, except foot 08/12/2007  . CELLULITIS, FACE 12/21/2008  . CHEST PAIN 06/14/2007  . COLECTOMY, PARTIAL, WITH ANASTOMOSIS, HX OF 10/15/2006  . COLONIC POLYPS, HX OF 10/15/2006  . Contusion of unspecified site 04/21/2008  . CYST, OVARIAN NEC/NOS 10/15/2006  . Dengue 02/22/2010    . DEPRESSION 10/15/2006  . DVT, HX OF 10/15/2006  . DYSAUTONOMIA 10/15/2006  . Dysfunction of eustachian tube 05/26/2009  . EAR PAIN, BILATERAL 08/16/2007  . GI BLEEDING 04/04/2007  . Glossitis 03/06/2008  . HEADACHE, CHRONIC 04/04/2007  . Hematemesis 10/25/2007  . HEMORRHOIDS 04/04/2007  . HYPERLIPIDEMIA 12/12/2007  . HYPERTENSION 10/15/2006  . HYPOTHYROIDISM 03/06/2008  . IBS 04/04/2007  . MASTECTOMY, BILATERAL, HX OF 08/12/2009  . NECK MASS 07/01/2007  . NEOP, MALIGNANT, FEMALE BREAST NOS 10/15/2006  . 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 OTITIS MEDIA, ACUTE, BILATERAL 12/12/2007  . RASH-NONVESICULAR 02/13/2008  . RECTAL BLEEDING 04/04/2007  . SHY-DRAGER SYNDROME 01/17/2007  . SINUSITIS- ACUTE-NOS 10/15/2006  . SYNCOPE 07/01/2007  . URI 07/29/2007  . VERTIGO 01/17/2007  . VIRAL INFECTION 02/17/2010  . VITAMIN D DEFICIENCY 03/06/2008  . Wheezing 08/20/2008  . WOUND, OPEN, LEG, WITHOUT COMPLICATION 08/16/2007   Past Surgical History  Procedure Date  . Abdominal hysterectomy   . Oophorectomy   . Cholecystectomy   . Spigellian hernia   . Multiple ablations   . Bilateral mastectomy/reconstruction   . Cesarean section     reports that she has never smoked. She does not have any smokeless tobacco history on file. She reports that she does not drink alcohol or use illicit drugs. family history includes Cancer in her sister. Allergies  Allergen Reactions  . Penicillins     REACTION: shock   Current Outpatient Prescriptions on File Prior to Visit  Medication Sig Dispense Refill  . Acetaminophen-Codeine (TYLENOL/CODEINE #3) 300-30 MG per tablet Take 1 tablet by mouth 4 (four) times daily as  needed for pain. To fill Mar 07, 2011  120 tablet  2  . celecoxib (CELEBREX) 200 MG capsule Take 200 mg by mouth 2 (two) times daily.        . fexofenadine (ALLEGRA) 180 MG tablet Take 1 tablet (180 mg total) by mouth daily.  90 tablet  3  . fluticasone (FLONASE) 50 MCG/ACT nasal spray Place 2 sprays into the  nose daily.  16 g  11  . levothyroxine (SYNTHROID, LEVOTHROID) 50 MCG tablet TAKE 1 TABLET EVERY DAY  90 tablet  3  . losartan (COZAAR) 100 MG tablet Take 1 tablet (100 mg total) by mouth daily.  90 tablet  3  . methylphenidate (CONCERTA) 36 MG CR tablet Take 1 tablet (36 mg total) by mouth daily. To fill July 31, 2010  30 tablet  0  . venlafaxine XR (EFFEXOR-XR) 150 MG 24 hr capsule TAKE 1 CAPSULE EVERY DAY  30 capsule  5  . zolpidem (AMBIEN) 10 MG tablet Take 1 tablet (10 mg total) by mouth at bedtime as needed.  30 tablet  5   Review of Systems Review of Systems  Constitutional: Negative for diaphoresis, activity change, appetite change and unexpected weight change.  HENT: Negative for hearing loss, ear pain, facial swelling, mouth sores and neck stiffness.   Eyes: Negative for pain, redness and visual disturbance.  Respiratory: Negative for shortness of breath and wheezing.   Cardiovascular: Negative for chest pain and palpitations.  Gastrointestinal: Negative for diarrhea, blood in stool, abdominal distention and rectal pain.  Genitourinary: Negative for hematuria, flank pain and decreased urine volume.  Musculoskeletal: Negative for myalgias and joint swelling.  Skin: Negative for color change and wound.  Neurological: Negative for syncope and numbness.  Hematological: Negative for adenopathy.  Psychiatric/Behavioral: Negative for hallucinations, self-injury, decreased concentration and agitation.      Objective:   Physical Exam BP 110/80  Pulse 84  Temp 97 F (36.1 C) (Oral)  SpO2 96% Physical Exam  VS noted Constitutional: Pt is oriented to person, place, and time. Appears well-developed and well-nourished.  HENT:  Head: Normocephalic and atraumatic.  Right Ear: External ear normal.  Left Ear: External ear normal.  Nose: Nose normal.  Mouth/Throat: Oropharynx is clear and moist.  Bilat tm's mild erythema.  Sinus tender.  Pharynx mild erythema Eyes: Conjunctivae and EOM  are normal. Pupils are equal, round, and reactive to light.  Neck: Normal range of motion. Neck supple. No JVD present. No tracheal deviation present.  Cardiovascular: Normal rate, regular rhythm, normal heart sounds and intact distal pulses.   Pulmonary/Chest: Effort normal and breath sounds normal.  Abdominal: Soft. Bowel sounds are normal. There is no tenderness.  Musculoskeletal: Normal range of motion. Exhibits no edema.  Lymphadenopathy:  Has no cervical adenopathy.  Neurological: Pt is alert and oriented to person, place, and time. Pt has normal reflexes. No cranial nerve deficit. Motor/dtr intact Skin: Skin is warm and dry. No rash noted.  Psychiatric:  Has  normal mood and affect. Behavior is normal.     Assessment & Plan:

## 2011-07-03 ENCOUNTER — Other Ambulatory Visit: Payer: Self-pay

## 2011-07-03 MED ORDER — ACETAMINOPHEN-CODEINE 300-30 MG PO TABS: 1.0000 | ORAL_TABLET | Freq: Four times a day (QID) | ORAL | Status: DC | PRN

## 2011-07-03 NOTE — Telephone Encounter (Signed)
Done hardcopy to robin  

## 2011-07-03 NOTE — Telephone Encounter (Signed)
Faxed hardcopy to pharmacy. 

## 2011-07-17 ENCOUNTER — Telehealth: Payer: Self-pay

## 2011-07-17 MED ORDER — CEFUROXIME AXETIL 250 MG PO TABS: 250.0000 mg | ORAL_TABLET | Freq: Two times a day (BID) | ORAL | Status: DC

## 2011-07-17 NOTE — Telephone Encounter (Signed)
Done erx 

## 2011-07-17 NOTE — Telephone Encounter (Signed)
Patient informed. 

## 2011-07-17 NOTE — Telephone Encounter (Signed)
Patient called to inform her ear infection has flared up again.  States it usually takes 2 prescriptions of the antibiotic to completely clear it up.   Call back number when called in [phone] and is ok to leave message if no answer.

## 2011-08-03 ENCOUNTER — Other Ambulatory Visit: Payer: Self-pay | Admitting: Internal Medicine

## 2011-08-11 ENCOUNTER — Ambulatory Visit: Payer: BC Managed Care – PPO | Admitting: Internal Medicine

## 2011-08-11 DIAGNOSIS — Z0289 Encounter for other administrative examinations: Secondary | ICD-10-CM

## 2011-08-12 ENCOUNTER — Ambulatory Visit (INDEPENDENT_AMBULATORY_CARE_PROVIDER_SITE_OTHER): Payer: BC Managed Care – PPO | Admitting: Family Medicine

## 2011-08-12 ENCOUNTER — Encounter: Payer: Self-pay | Admitting: Family Medicine

## 2011-08-12 VITALS — BP 124/88 | HR 83 | Temp 98.3°F

## 2011-08-12 DIAGNOSIS — J329 Chronic sinusitis, unspecified: Secondary | ICD-10-CM

## 2011-08-12 MED ORDER — CEFUROXIME AXETIL 250 MG PO TABS: 250.0000 mg | ORAL_TABLET | Freq: Two times a day (BID) | ORAL | Status: AC

## 2011-08-12 NOTE — Assessment & Plan Note (Signed)
New to provider.  Pt's hx and PE consistent w/ infxn.  Start abx.  Reviewed supportive care and red flags that should prompt return.  Pt expressed understanding and is in agreement w/ plan.

## 2011-08-12 NOTE — Progress Notes (Signed)
  Subjective:    Patient ID: [name], female    DOB: [birth year], 56 y.o.   MRN: [id number]  HPI ? Sinus infxn- hx of 'chronic sinus infections'.  Again having sinus pressure.  Has been using home rinses, OTC sudafed.  Now waking w/ sinus HA, pain w/ bending forward.  Bilateral ear pain started yesterday.  Intermittent subjective fever.  sxs started Wednesday.  + sick contacts.   Review of Systems For ROS see HPI     Objective:   Physical Exam  Vitals reviewed. Constitutional: She appears well-developed and well-nourished. No distress.  HENT:  Head: Normocephalic and atraumatic.  Right Ear: Tympanic membrane normal.  Left Ear: Tympanic membrane normal.  Nose: Mucosal edema and rhinorrhea present. Right sinus exhibits maxillary sinus tenderness and frontal sinus tenderness. Left sinus exhibits maxillary sinus tenderness and frontal sinus tenderness.  Mouth/Throat: Uvula is midline and mucous membranes are normal. Posterior oropharyngeal erythema present. No oropharyngeal exudate.  Eyes: Conjunctivae and EOM are normal. Pupils are equal, round, and reactive to light.  Neck: Normal range of motion. Neck supple.  Cardiovascular: Normal rate, regular rhythm and normal heart sounds.   Pulmonary/Chest: Effort normal and breath sounds normal. No respiratory distress. She has no wheezes.  Lymphadenopathy:    She has no cervical adenopathy.          Assessment & Plan:

## 2011-08-12 NOTE — Patient Instructions (Addendum)
This appears to be a sinus infection Start the Ceftin twice daily w/ food Drink plenty of fluids REST! Hang in there!!! Have a safe trip!!

## 2011-08-21 DIAGNOSIS — H25019 Cortical age-related cataract, unspecified eye: Secondary | ICD-10-CM | POA: Diagnosis not present

## 2011-08-21 DIAGNOSIS — H33319 Horseshoe tear of retina without detachment, unspecified eye: Secondary | ICD-10-CM | POA: Diagnosis not present

## 2011-09-29 ENCOUNTER — Other Ambulatory Visit: Payer: Self-pay | Admitting: Internal Medicine

## 2011-09-29 NOTE — Telephone Encounter (Signed)
Done hardcopy to robin  

## 2011-09-29 NOTE — Telephone Encounter (Signed)
Faxed hardcopy to pharmacy. 

## 2011-11-03 ENCOUNTER — Other Ambulatory Visit: Payer: Self-pay | Admitting: Internal Medicine

## 2011-11-03 ENCOUNTER — Ambulatory Visit (INDEPENDENT_AMBULATORY_CARE_PROVIDER_SITE_OTHER): Payer: BC Managed Care – PPO | Admitting: Internal Medicine

## 2011-11-03 ENCOUNTER — Encounter: Payer: Self-pay | Admitting: Internal Medicine

## 2011-11-03 VITALS — BP 104/70 | HR 85 | Temp 98.1°F | Ht 69.0 in

## 2011-11-03 DIAGNOSIS — F411 Generalized anxiety disorder: Secondary | ICD-10-CM | POA: Diagnosis not present

## 2011-11-03 DIAGNOSIS — J019 Acute sinusitis, unspecified: Secondary | ICD-10-CM

## 2011-11-03 DIAGNOSIS — I10 Essential (primary) hypertension: Secondary | ICD-10-CM | POA: Diagnosis not present

## 2011-11-03 MED ORDER — CEFUROXIME AXETIL 250 MG PO TABS: 250.0000 mg | ORAL_TABLET | Freq: Two times a day (BID) | ORAL | Status: DC

## 2011-11-03 NOTE — Patient Instructions (Addendum)
Take all new medications as prescribed Continue all other medications as before  

## 2011-11-04 ENCOUNTER — Encounter: Payer: Self-pay | Admitting: Internal Medicine

## 2011-11-04 MED ORDER — VENLAFAXINE HCL ER 150 MG PO CP24: 150.0000 mg | ORAL_CAPSULE | Freq: Every day | ORAL | Status: DC

## 2011-11-04 NOTE — Assessment & Plan Note (Signed)
stable overall by hx and exam, most recent data reviewed with pt, and pt to continue medical treatment as before, for effexor refill Lab Results  Component Value Date   WBC 6.1 02/11/2008   HGB 13.6 02/11/2008   HCT 38.2 02/11/2008   PLT 227 02/11/2008   GLUCOSE 104* 02/11/2008   CHOL 155 02/11/2008   TRIG 156* 02/11/2008   HDL 51.3 02/11/2008   LDLDIRECT 162.4 07/16/2007   LDLCALC 73 02/11/2008   ALT 26 02/11/2008   AST 30 02/11/2008   NA 138 02/11/2008   K 3.8 02/11/2008   CL 103 02/11/2008   CREATININE 0.9 02/11/2008   BUN 12 02/11/2008   CO2 28 02/11/2008   TSH 11.54* 02/11/2008   INR 1.0 12/03/2006   HGBA1C  Value: 5.2 (NOTE)   The ADA recommends the following therapeutic goal for glycemic   control related to Hgb A1C measurement:   Goal of Therapy:   < 7.0% Hgb A1C   Reference: American Diabetes Association: Clinical Practice   Recommendations 2008, Diabetes Care,  2008, 31:(Suppl 1). 08/12/2007

## 2011-11-04 NOTE — Assessment & Plan Note (Signed)
Mild to mod, for antibx course,  to f/u any worsening symptoms or concerns 

## 2011-11-04 NOTE — Progress Notes (Signed)
Subjective:    Patient ID: [name], female    DOB: 13-Apr-1955, 56 y.o.   MRN: [id number]  HPI   Here with 3 days acute onset fever, facial pain, bilat ear pressure, general weakness and malaise, and greenish d/c, with slight ST, but little to no cough and Pt denies chest pain, increased sob or doe, wheezing, orthopnea, PND, increased LE swelling, palpitations, dizziness or syncope.  Denies worsening depressive symptoms, suicidal ideation, or panic, though has ongoing anxiety, not increased recently.    Pt denies polydipsia, polyuria.  Needs effexor refill.   Pt denies fever, wt loss, night sweats, loss of appetite, or other constitutional symptoms except for the above Past Medical History  Diagnosis Date  . Chronic sinus infection 04/12/2010  . Abdominal pain, epigastric 10/25/2007  . Abdominal pain, right lower quadrant 11/20/2006  . Abdominal pain, right upper quadrant 10/23/2006  . ADD 01/20/2009  . ADIPOSITY, LOCALIZED 10/15/2006  . ALLERGIC RHINITIS 10/15/2006  . ANGIOEDEMA 08/12/2008  . ANIMAL BITE 10/07/2009  . ANXIETY DEPRESSION 04/04/2007  . ANXIETY 10/15/2006  . ASTHMATIC BRONCHITIS, ACUTE 01/27/2008  . Cellulitis and abscess of leg, except foot 08/12/2007  . CELLULITIS, FACE 12/21/2008  . CHEST PAIN 06/14/2007  . COLECTOMY, PARTIAL, WITH ANASTOMOSIS, HX OF 10/15/2006  . COLONIC POLYPS, HX OF 10/15/2006  . Contusion of unspecified site 04/21/2008  . CYST, OVARIAN NEC/NOS 10/15/2006  . Dengue 02/22/2010  . DEPRESSION 10/15/2006  . DVT, HX OF 10/15/2006  . DYSAUTONOMIA 10/15/2006  . Dysfunction of eustachian tube 05/26/2009  . EAR PAIN, BILATERAL 08/16/2007  . GI BLEEDING 04/04/2007  . Glossitis 03/06/2008  . HEADACHE, CHRONIC 04/04/2007  . Hematemesis 10/25/2007  . HEMORRHOIDS 04/04/2007  . HYPERLIPIDEMIA 12/12/2007  . HYPERTENSION 10/15/2006  . HYPOTHYROIDISM 03/06/2008  . IBS 04/04/2007  . MASTECTOMY, BILATERAL, HX OF 08/12/2009  . NECK MASS 07/01/2007  . NEOP, MALIGNANT, FEMALE  BREAST NOS 10/15/2006  . 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 OTITIS MEDIA, ACUTE, BILATERAL 12/12/2007  . RASH-NONVESICULAR 02/13/2008  . RECTAL BLEEDING 04/04/2007  . SHY-DRAGER SYNDROME 01/17/2007  . SINUSITIS- ACUTE-NOS 10/15/2006  . SYNCOPE 07/01/2007  . URI 07/29/2007  . VERTIGO 01/17/2007  . VIRAL INFECTION 02/17/2010  . VITAMIN D DEFICIENCY 03/06/2008  . Wheezing 08/20/2008  . WOUND, OPEN, LEG, WITHOUT COMPLICATION 08/16/2007   Past Surgical History  Procedure Date  . Abdominal hysterectomy   . Oophorectomy   . Cholecystectomy   . Spigellian hernia   . Multiple ablations   . Bilateral mastectomy/reconstruction   . Cesarean section     reports that she has never smoked. She does not have any smokeless tobacco history on file. She reports that she does not drink alcohol or use illicit drugs. family history includes Cancer in her sister. Allergies  Allergen Reactions  . Penicillins     REACTION: shock   Current Outpatient Prescriptions on File Prior to Visit  Medication Sig Dispense Refill  . acetaminophen-codeine (TYLENOL #3) 300-30 MG per tablet TAKE 1 TABLET 4 TIMES A DAY AS NEEDED FOR PAIN  120 tablet  2  . celecoxib (CELEBREX) 200 MG capsule Take 200 mg by mouth 2 (two) times daily.        . fexofenadine (ALLEGRA) 180 MG tablet Take 1 tablet (180 mg total) by mouth daily.  90 tablet  3  . fluticasone (FLONASE) 50 MCG/ACT nasal spray Place 2 sprays into the nose daily.  16 g  11  . levothyroxine (SYNTHROID, LEVOTHROID) 50 MCG tablet TAKE  1 TABLET EVERY DAY  90 tablet  3  . losartan (COZAAR) 100 MG tablet Take 1 tablet (100 mg total) by mouth daily.  90 tablet  3  . losartan (COZAAR) 100 MG tablet TAKE 1 TABLET EVERY DAY  30 tablet  10  . methylphenidate (CONCERTA) 36 MG CR tablet Take 1 tablet (36 mg total) by mouth daily. To fill July 31, 2010  30 tablet  0  . zolpidem (AMBIEN) 10 MG tablet Take 1 tablet (10 mg total) by mouth at bedtime as needed.  30 tablet  5  .  venlafaxine XR (EFFEXOR-XR) 150 MG 24 hr capsule TAKE 1 CAPSULE EVERY DAY  30 capsule  11   Review of Systems  Constitutional: Negative for diaphoresis and unexpected weight change.  HENT: Negative for tinnitus.   Eyes: Negative for photophobia and visual disturbance.  Respiratory: Negative for choking and stridor.   Gastrointestinal: Negative for vomiting and blood in stool.  Genitourinary: Negative for hematuria and decreased urine volume.  Musculoskeletal: Negative for gait problem.  Skin: Negative for color change and wound.  Neurological: Negative for tremors and numbness.  Psychiatric/Behavioral: Negative for decreased concentration. The patient is not hyperactive.       Objective:   Physical Exam BP 104/70  Pulse 85  Temp 98.1 F (36.7 C) (Oral)  Ht 5\' 9"  (1.753 m)  SpO2 96% Physical Exam  VS noted Constitutional: Pt appears well-developed and well-nourished.  HENT: Head: Normocephalic.  Right Ear: External ear normal.  Left Ear: External ear normal.  Bilat tm's mild to mod erythema.  Sinus tender.  Pharynx mild erythema Eyes: Conjunctivae and EOM are normal. Pupils are equal, round, and reactive to light.  Neck: Normal range of motion. Neck supple.  Cardiovascular: Normal rate and regular rhythm.   Pulmonary/Chest: Effort normal and breath sounds normal.  Neurological: Pt is alert. Not confused  Skin: Skin is warm. No erythema.  Psychiatric: Pt behavior is normal. Thought content normal. Mild nervous, not depressed appearing     Assessment & Plan:

## 2011-11-04 NOTE — Assessment & Plan Note (Signed)
stable overall by hx and exam, most recent data reviewed with pt, and pt to continue medical treatment as before BP Readings from Last 3 Encounters:  11/03/11 104/70  08/12/11 124/88  06/29/11 110/80

## 2011-11-17 ENCOUNTER — Telehealth: Payer: Self-pay | Admitting: Internal Medicine

## 2011-11-17 ENCOUNTER — Ambulatory Visit: Payer: BC Managed Care – PPO | Admitting: Internal Medicine

## 2011-11-17 MED ORDER — CEFUROXIME AXETIL 250 MG PO TABS: 250.0000 mg | ORAL_TABLET | Freq: Two times a day (BID) | ORAL | Status: DC

## 2011-11-17 NOTE — Telephone Encounter (Signed)
Requesting a refill on her antibiotic.  The sinus infection is back.  She is on the schedule for 4:15 today.  Should she keep the appt?

## 2011-11-17 NOTE — Telephone Encounter (Signed)
Ok for antibx this time only  Ok to hold off on appt

## 2011-11-17 NOTE — Telephone Encounter (Signed)
Pt is aware.  Appt is cx'd.  Advised pt to call her pharmacy later today.

## 2011-12-13 ENCOUNTER — Ambulatory Visit (INDEPENDENT_AMBULATORY_CARE_PROVIDER_SITE_OTHER): Payer: BC Managed Care – PPO | Admitting: Internal Medicine

## 2011-12-13 ENCOUNTER — Encounter: Payer: Self-pay | Admitting: Internal Medicine

## 2011-12-13 ENCOUNTER — Ambulatory Visit (INDEPENDENT_AMBULATORY_CARE_PROVIDER_SITE_OTHER)
Admission: RE | Admit: 2011-12-13 | Discharge: 2011-12-13 | Disposition: A | Discharge status: Home / Self Care | Payer: BC Managed Care – PPO | Source: Ambulatory Visit | Attending: Internal Medicine | Admitting: Internal Medicine

## 2011-12-13 ENCOUNTER — Other Ambulatory Visit (INDEPENDENT_AMBULATORY_CARE_PROVIDER_SITE_OTHER): Payer: BC Managed Care – PPO

## 2011-12-13 VITALS — BP 100/62 | HR 88 | Temp 98.2°F

## 2011-12-13 DIAGNOSIS — E039 Hypothyroidism, unspecified: Secondary | ICD-10-CM

## 2011-12-13 DIAGNOSIS — E785 Hyperlipidemia, unspecified: Secondary | ICD-10-CM

## 2011-12-13 DIAGNOSIS — R1011 Right upper quadrant pain: Secondary | ICD-10-CM | POA: Diagnosis not present

## 2011-12-13 DIAGNOSIS — J329 Chronic sinusitis, unspecified: Secondary | ICD-10-CM

## 2011-12-13 DIAGNOSIS — I10 Essential (primary) hypertension: Secondary | ICD-10-CM

## 2011-12-13 LAB — CBC WITH DIFFERENTIAL/PLATELET
Basophils Absolute: 0 10*3/uL (ref 0.0–0.1)
Eosinophils Relative: 1.6 % (ref 0.0–5.0)
HCT: 43.7 % (ref 36.0–46.0)
Hemoglobin: 14.9 g/dL (ref 12.0–15.0)
Lymphs Abs: 2.3 10*3/uL (ref 0.7–4.0)
MCV: 84.7 fl (ref 78.0–100.0)
Monocytes Absolute: 0.4 10*3/uL (ref 0.1–1.0)
Monocytes Relative: 4.4 % (ref 3.0–12.0)
Neutro Abs: 5.8 10*3/uL (ref 1.4–7.7)
Platelets: 322 10*3/uL (ref 150.0–400.0)
RDW: 13.6 % (ref 11.5–14.6)

## 2011-12-13 LAB — BASIC METABOLIC PANEL
Calcium: 9.3 mg/dL (ref 8.4–10.5)
GFR: 67.7 mL/min (ref >60.00)
Glucose, Bld: 105 mg/dL — ABNORMAL HIGH (ref 70–99)
Potassium: 4.5 mEq/L (ref 3.5–5.1)
Sodium: 139 mEq/L (ref 135–145)

## 2011-12-13 LAB — URINALYSIS, ROUTINE W REFLEX MICROSCOPIC
Leukocytes, UA: NEGATIVE
Nitrite: NEGATIVE
Total Protein, Urine: NEGATIVE
Urine Glucose: NEGATIVE
Urobilinogen, UA: 0.2 (ref 0.0–1.0)
pH: 6 (ref 5.0–8.0)

## 2011-12-13 LAB — TSH: TSH: 2.77 u[IU]/mL (ref 0.35–5.50)

## 2011-12-13 LAB — HEPATIC FUNCTION PANEL
AST: 28 U/L (ref 0–37)
Albumin: 4.2 g/dL (ref 3.5–5.2)
Alkaline Phosphatase: 132 U/L — ABNORMAL HIGH (ref 39–117)
Total Bilirubin: 0.8 mg/dL (ref 0.3–1.2)

## 2011-12-13 LAB — LIPASE: Lipase: 24 U/L (ref 11.0–59.0)

## 2011-12-13 LAB — LIPID PANEL: Triglycerides: 195 mg/dL — ABNORMAL HIGH (ref 0.0–149.0)

## 2011-12-13 LAB — LDL CHOLESTEROL, DIRECT: Direct LDL: 137.4 mg/dL

## 2011-12-13 MED ORDER — LEVOFLOXACIN 250 MG PO TABS: 250.0000 mg | ORAL_TABLET | Freq: Every day | ORAL | Status: DC

## 2011-12-13 NOTE — Assessment & Plan Note (Signed)
stable overall by hx and exam, most recent data reviewed with pt, and pt to continue medical treatment as before Lab Results  Component Value Date   TSH 2.77 12/13/2011

## 2011-12-13 NOTE — Assessment & Plan Note (Signed)
stable overall by hx and exam, most recent data reviewed with pt, and pt to continue medical treatment as before Lab Results  Component Value Date   LDLCALC 73 02/11/2008

## 2011-12-13 NOTE — Assessment & Plan Note (Signed)
Mild to mod, for antibx course,  to f/u any worsening symptoms or concerns 

## 2011-12-13 NOTE — Assessment & Plan Note (Signed)
stable overall by hx and exam, most recent data reviewed with pt, and pt to continue medical treatment as before BP Readings from Last 3 Encounters:  12/13/11 100/62  11/03/11 104/70  08/12/11 124/88

## 2011-12-13 NOTE — Progress Notes (Signed)
Subjective:    Patient ID: [name], female    DOB: 17-Jul-1955, 56 y.o.   MRN: [id number]  HPI   Here with 3 days acute onset fever, facial pain, pressure, general weakness and malaise, with right ear pain and popping/crackling, with slight ST, but little to no cough and Pt denies chest pain, increased sob or doe, wheezing, orthopnea, PND, increased LE swelling, palpitations, dizziness or syncope.  Also with abd pain RUQ x 3-6 mo getting gradually worse, does not think constipated b/c has BM daily, has been taking tylenol #3 fairly regular, has hx of chronic diarrhea none recent;  Pain crampy, mild to mod, intermittent, worse in past 3 days, worse with BM, some nausea but no vomiting, has some decreasedd appetitie, no BRBPR,  Denies urinary symptoms such as dysuria, frequency, urgency,or hematuria.  No radiation, s/p CCX and appy, no wt loss.  Has seen Dr Raenette Rover for partial colectomy in past. Pt denies chest pain, increased sob or doe, wheezing, orthopnea, PND, increased LE swelling, palpitations, dizziness or syncope. Pt denies new neurological symptoms such as new headache, or facial or extremity weakness or numbness. Denies hyper or hypo thyroid symptoms such as voice, skin or hair change. Past Medical History  Diagnosis Date  . Chronic sinus infection 04/12/2010  . Abdominal pain, epigastric 10/25/2007  . Abdominal pain, right lower quadrant 11/20/2006  . Abdominal pain, right upper quadrant 10/23/2006  . ADD 01/20/2009  . ADIPOSITY, LOCALIZED 10/15/2006  . ALLERGIC RHINITIS 10/15/2006  . ANGIOEDEMA 08/12/2008  . ANIMAL BITE 10/07/2009  . ANXIETY DEPRESSION 04/04/2007  . ANXIETY 10/15/2006  . ASTHMATIC BRONCHITIS, ACUTE 01/27/2008  . Cellulitis and abscess of leg, except foot 08/12/2007  . CELLULITIS, FACE 12/21/2008  . CHEST PAIN 06/14/2007  . COLECTOMY, PARTIAL, WITH ANASTOMOSIS, HX OF 10/15/2006  . COLONIC POLYPS, HX OF 10/15/2006  . Contusion of unspecified site 04/21/2008  . CYST,  OVARIAN NEC/NOS 10/15/2006  . Dengue 02/22/2010  . DEPRESSION 10/15/2006  . DVT, HX OF 10/15/2006  . DYSAUTONOMIA 10/15/2006  . Dysfunction of eustachian tube 05/26/2009  . EAR PAIN, BILATERAL 08/16/2007  . GI BLEEDING 04/04/2007  . Glossitis 03/06/2008  . HEADACHE, CHRONIC 04/04/2007  . Hematemesis 10/25/2007  . HEMORRHOIDS 04/04/2007  . HYPERLIPIDEMIA 12/12/2007  . HYPERTENSION 10/15/2006  . HYPOTHYROIDISM 03/06/2008  . IBS 04/04/2007  . MASTECTOMY, BILATERAL, HX OF 08/12/2009  . NECK MASS 07/01/2007  . NEOP, MALIGNANT, FEMALE BREAST NOS 10/15/2006  . 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 OTITIS MEDIA, ACUTE, BILATERAL 12/12/2007  . RASH-NONVESICULAR 02/13/2008  . RECTAL BLEEDING 04/04/2007  . SHY-DRAGER SYNDROME 01/17/2007  . SINUSITIS- ACUTE-NOS 10/15/2006  . SYNCOPE 07/01/2007  . URI 07/29/2007  . VERTIGO 01/17/2007  . VIRAL INFECTION 02/17/2010  . VITAMIN D DEFICIENCY 03/06/2008  . Wheezing 08/20/2008  . WOUND, OPEN, LEG, WITHOUT COMPLICATION 08/16/2007   Past Surgical History  Procedure Date  . Abdominal hysterectomy   . Oophorectomy   . Cholecystectomy   . Spigellian hernia   . Multiple ablations   . Bilateral mastectomy/reconstruction   . Cesarean section     reports that she has never smoked. She does not have any smokeless tobacco history on file. She reports that she does not drink alcohol or use illicit drugs. family history includes Cancer in her sister. Allergies  Allergen Reactions  . Penicillins     REACTION: shock   Current Outpatient Prescriptions on File Prior to Visit  Medication Sig Dispense Refill  . acetaminophen-codeine (TYLENOL #3) 300-30  MG per tablet TAKE 1 TABLET 4 TIMES A DAY AS NEEDED FOR PAIN  120 tablet  2  . cefUROXime (CEFTIN) 250 MG tablet Take 1 tablet (250 mg total) by mouth 2 (two) times daily.  20 tablet  0  . celecoxib (CELEBREX) 200 MG capsule Take 200 mg by mouth 2 (two) times daily.        . fexofenadine (ALLEGRA) 180 MG tablet Take 1 tablet (180  mg total) by mouth daily.  90 tablet  3  . fluticasone (FLONASE) 50 MCG/ACT nasal spray Place 2 sprays into the nose daily.  16 g  11  . levothyroxine (SYNTHROID, LEVOTHROID) 50 MCG tablet TAKE 1 TABLET EVERY DAY  90 tablet  3  . losartan (COZAAR) 100 MG tablet Take 1 tablet (100 mg total) by mouth daily.  90 tablet  3  . losartan (COZAAR) 100 MG tablet TAKE 1 TABLET EVERY DAY  30 tablet  10  . methylphenidate (CONCERTA) 36 MG CR tablet Take 1 tablet (36 mg total) by mouth daily. To fill July 31, 2010  30 tablet  0  . venlafaxine XR (EFFEXOR-XR) 150 MG 24 hr capsule Take 1 capsule (150 mg total) by mouth daily.  30 capsule  11  . zolpidem (AMBIEN) 10 MG tablet Take 1 tablet (10 mg total) by mouth at bedtime as needed.  30 tablet  5   Review of Systems  Constitutional: Negative for diaphoresis and unexpected weight change.  HENT: Negative for tinnitus.   Eyes: Negative for photophobia and visual disturbance.  Respiratory: Negative for choking and stridor.   Gastrointestinal: Negative for vomiting and blood in stool.  Genitourinary: Negative for hematuria and decreased urine volume.  Musculoskeletal: Negative for gait problem.  Skin: Negative for color change and wound.  Neurological: Negative for tremors and numbness.  Psychiatric/Behavioral: Negative for decreased concentration. The patient is not hyperactive.       Objective:   Physical Exam BP 100/62  Pulse 88  Temp 98.2 F (36.8 C) (Oral)  SpO2 97% Physical Exam  VS noted, mild ill Constitutional: Pt appears well-developed and well-nourished.  HENT: Head: Normocephalic.  Right Ear: External ear normal.  Left Ear: External ear normal.  Bilat tm's mild erythema.  Sinus nontender.  Pharynx mild erythema Eyes: Conjunctivae and EOM are normal. Pupils are equal, round, and reactive to light.  Neck: Normal range of motion. Neck supple.  Cardiovascular: Normal rate and regular rhythm.   Pulmonary/Chest: Effort normal and breath  sounds normal.  Abd:  Soft, NT, non-distended, + BS except for mild RUQ tender, no guarding or rebound Neurological: Pt is alert. Not confused  Skin: Skin is warm. No erythema.  Psychiatric: Pt behavior is normal. Thought content normal.     Assessment & Plan:

## 2011-12-13 NOTE — Assessment & Plan Note (Addendum)
Unclear etiology, ? Constipation, consider mag citrate, minimize tylenol #3, for abd films - r/o obstruction, consider GI vs gen surg referral, for labs today  Note:  Total time for pt hx, exam, review of record with pt in the room, determination of diagnoses and plan for further eval and tx is > 40 min, with over 50% spent in coordination and counseling of patient

## 2011-12-13 NOTE — Patient Instructions (Addendum)
Take all new medications as prescribed - the levaquin Continue all other medications as before Please have the pharmacy call with any other refills you may need. Please go to XRAY in the Basement for the x-ray test Please go to LAB in the Basement for the blood and/or urine tests to be done today You will be contacted by phone if any changes need to be made immediately.  Otherwise, you will receive a letter about your results with an explanation, but please check with MyChart first. Thank you for enrolling in MyChart. Please follow the instructions below to securely access your online medical record. MyChart allows you to send messages to your doctor, view your test results, renew your prescriptions, schedule appointments, and more. To Log into MyChart, please go to [email or web address], and your Username is:  [phone] You will be contacted regarding the referral for: abdomen ultrasound Please call if you have worsening pain and/or fever as we could consider adding flagly (a second antibiotic used for possible diverticulitis) Depending on the results of the tests and treatment, you may need to consider a referral to GI or Gen Surgury if not improved

## 2011-12-15 ENCOUNTER — Ambulatory Visit
Admission: RE | Admit: 2011-12-15 | Discharge: 2011-12-15 | Disposition: A | Discharge status: Home / Self Care | Payer: Medicare Other | Source: Ambulatory Visit | Attending: Internal Medicine | Admitting: Internal Medicine

## 2011-12-15 DIAGNOSIS — R1011 Right upper quadrant pain: Secondary | ICD-10-CM

## 2012-01-10 ENCOUNTER — Other Ambulatory Visit: Payer: Self-pay | Admitting: Internal Medicine

## 2012-01-11 NOTE — Telephone Encounter (Signed)
Done hardcopy to robin  

## 2012-01-11 NOTE — Telephone Encounter (Signed)
Faxed hardcopy to pharmacy. 

## 2012-01-30 ENCOUNTER — Ambulatory Visit (INDEPENDENT_AMBULATORY_CARE_PROVIDER_SITE_OTHER): Payer: BC Managed Care – PPO | Admitting: *Deleted

## 2012-01-30 DIAGNOSIS — Z23 Encounter for immunization: Secondary | ICD-10-CM | POA: Diagnosis not present

## 2012-02-07 ENCOUNTER — Encounter: Payer: Self-pay | Admitting: Internal Medicine

## 2012-02-07 ENCOUNTER — Ambulatory Visit (INDEPENDENT_AMBULATORY_CARE_PROVIDER_SITE_OTHER): Payer: BC Managed Care – PPO | Admitting: Internal Medicine

## 2012-02-07 VITALS — BP 102/70 | HR 93 | Temp 97.2°F

## 2012-02-07 DIAGNOSIS — R42 Dizziness and giddiness: Secondary | ICD-10-CM

## 2012-02-07 DIAGNOSIS — R0989 Other specified symptoms and signs involving the circulatory and respiratory systems: Secondary | ICD-10-CM | POA: Diagnosis not present

## 2012-02-07 DIAGNOSIS — R011 Cardiac murmur, unspecified: Secondary | ICD-10-CM | POA: Diagnosis not present

## 2012-02-07 DIAGNOSIS — R0609 Other forms of dyspnea: Secondary | ICD-10-CM

## 2012-02-07 DIAGNOSIS — R0600 Dyspnea, unspecified: Secondary | ICD-10-CM | POA: Insufficient documentation

## 2012-02-07 DIAGNOSIS — J329 Chronic sinusitis, unspecified: Secondary | ICD-10-CM | POA: Diagnosis not present

## 2012-02-07 MED ORDER — CEFUROXIME AXETIL 250 MG PO TABS: 250.0000 mg | ORAL_TABLET | Freq: Two times a day (BID) | ORAL | Status: DC

## 2012-02-07 NOTE — Assessment & Plan Note (Signed)
Hard to characterize but seems orthostatic type, no syncope,  to f/u any worsening symptoms or concerns and for eval as per other today,  Lab Results  Component Value Date   WBC 8.7 12/13/2011   HGB 14.9 12/13/2011   HCT 43.7 12/13/2011   PLT 322.0 12/13/2011   GLUCOSE 105* 12/13/2011   CHOL 209* 12/13/2011   TRIG 195.0* 12/13/2011   HDL 45.10 12/13/2011   LDLDIRECT 137.4 12/13/2011   LDLCALC 73 02/11/2008   ALT 28 12/13/2011   AST 28 12/13/2011   NA 139 12/13/2011   K 4.5 12/13/2011   CL 103 12/13/2011   CREATININE 0.9 12/13/2011   BUN 18 12/13/2011   CO2 28 12/13/2011   TSH 2.77 12/13/2011   INR 1.0 12/03/2006   HGBA1C  Value: 5.2 (NOTE)   The ADA recommends the following therapeutic goal for glycemic   control related to Hgb A1C measurement:   Goal of Therapy:   < 7.0% Hgb A1C   Reference: American Diabetes Association: Clinical Practice   Recommendations 2008, Diabetes Care,  2008, 31:(Suppl 1). 08/12/2007

## 2012-02-07 NOTE — Assessment & Plan Note (Signed)
With flare - Mild to mod, for antibx course,  to f/u any worsening symptoms or concerns 

## 2012-02-07 NOTE — Progress Notes (Signed)
Subjective:    Patient ID: [name], female    DOB: [birth year], 57 y.o.   MRN: [id number]  HPI  Here with 2-3 days acute onset fever, facial pain, pressure, headache, general weakness and malaise, and greenish d/c, with mild ST.  Also c/o 1-2 mo worsening unusual for her dizziness with standing that seems different to her from prior, as well as unsuual sob/doe with walking fast such as rushing into the stadium at the football game from the parking lot about 200 ft, has to stop to rest, dizziness some worse.   But pt denies chest pain, wheezing, increased sob or doe with more usual activities such as walking a slower pace for 2 miles several times per wk, and no orthopnea, PND, increased LE swelling, palpitations,  or syncope.  Denies worsening depressive symptoms, suicidal ideation, or panic; has ongoing anxiety, not increased recently.    Pt denies polydipsia, polyuria.   Echo 2008 with normal EF, but unable to visualize the aortic valve well.  Dec 2013 cxr with noted hyperinflation per radiology, no hx of diagnosed copd.   Past Medical History  Diagnosis Date  . Chronic sinus infection 04/12/2010  . Abdominal pain, epigastric 10/25/2007  . Abdominal pain, right lower quadrant 11/20/2006  . Abdominal pain, right upper quadrant 10/23/2006  . ADD 01/20/2009  . ADIPOSITY, LOCALIZED 10/15/2006  . ALLERGIC RHINITIS 10/15/2006  . ANGIOEDEMA 08/12/2008  . ANIMAL BITE 10/07/2009  . ANXIETY DEPRESSION 04/04/2007  . ANXIETY 10/15/2006  . ASTHMATIC BRONCHITIS, ACUTE 01/27/2008  . Cellulitis and abscess of leg, except foot 08/12/2007  . CELLULITIS, FACE 12/21/2008  . CHEST PAIN 06/14/2007  . COLECTOMY, PARTIAL, WITH ANASTOMOSIS, HX OF 10/15/2006  . COLONIC POLYPS, HX OF 10/15/2006  . Contusion of unspecified site 04/21/2008  . CYST, OVARIAN NEC/NOS 10/15/2006  . Dengue 02/22/2010  . DEPRESSION 10/15/2006  . DVT, HX OF 10/15/2006  . DYSAUTONOMIA 10/15/2006  . Dysfunction of eustachian tube 05/26/2009  .  EAR PAIN, BILATERAL 08/16/2007  . GI BLEEDING 04/04/2007  . Glossitis 03/06/2008  . HEADACHE, CHRONIC 04/04/2007  . Hematemesis 10/25/2007  . HEMORRHOIDS 04/04/2007  . HYPERLIPIDEMIA 12/12/2007  . HYPERTENSION 10/15/2006  . HYPOTHYROIDISM 03/06/2008  . IBS 04/04/2007  . MASTECTOMY, BILATERAL, HX OF 08/12/2009  . NECK MASS 07/01/2007  . NEOP, MALIGNANT, FEMALE BREAST NOS 10/15/2006  . 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 OTITIS MEDIA, ACUTE, BILATERAL 12/12/2007  . RASH-NONVESICULAR 02/13/2008  . RECTAL BLEEDING 04/04/2007  . SHY-DRAGER SYNDROME 01/17/2007  . SINUSITIS- ACUTE-NOS 10/15/2006  . SYNCOPE 07/01/2007  . URI 07/29/2007  . VERTIGO 01/17/2007  . VIRAL INFECTION 02/17/2010  . VITAMIN D DEFICIENCY 03/06/2008  . Wheezing 08/20/2008  . WOUND, OPEN, LEG, WITHOUT COMPLICATION 08/16/2007   Past Surgical History  Procedure Date  . Abdominal hysterectomy   . Oophorectomy   . Cholecystectomy   . Spigellian hernia   . Multiple ablations   . Bilateral mastectomy/reconstruction   . Cesarean section     reports that she has never smoked. She does not have any smokeless tobacco history on file. She reports that she does not drink alcohol or use illicit drugs. family history includes Cancer in her sister. Allergies  Allergen Reactions  . Penicillins     REACTION: shock   Current Outpatient Prescriptions on File Prior to Visit  Medication Sig Dispense Refill  . acetaminophen-codeine (TYLENOL #3) 300-30 MG per tablet TAKE 1 TABLET BY MOUTH 4 TIMES A DAY AS NEEDED FOR PAIN  120 tablet  2  . celecoxib (CELEBREX) 200 MG capsule Take 200 mg by mouth 2 (two) times daily.        . fexofenadine (ALLEGRA) 180 MG tablet Take 1 tablet (180 mg total) by mouth daily.  90 tablet  3  . fluticasone (FLONASE) 50 MCG/ACT nasal spray Place 2 sprays into the nose daily.  16 g  11  . levothyroxine (SYNTHROID, LEVOTHROID) 50 MCG tablet TAKE 1 TABLET EVERY DAY  90 tablet  3  . losartan (COZAAR) 100 MG tablet Take 1 tablet  (100 mg total) by mouth daily.  90 tablet  3  . losartan (COZAAR) 100 MG tablet TAKE 1 TABLET EVERY DAY  30 tablet  10  . methylphenidate (CONCERTA) 36 MG CR tablet Take 1 tablet (36 mg total) by mouth daily. To fill July 31, 2010  30 tablet  0  . venlafaxine XR (EFFEXOR-XR) 150 MG 24 hr capsule Take 1 capsule (150 mg total) by mouth daily.  30 capsule  11  . zolpidem (AMBIEN) 10 MG tablet Take 1 tablet (10 mg total) by mouth at bedtime as needed.  30 tablet  5   Review of Systems  Constitutional: Negative for unexpected weight change, or unusual diaphoresis  HENT: Negative for tinnitus.   Eyes: Negative for photophobia and visual disturbance.  Respiratory: Negative for choking and stridor.   Gastrointestinal: Negative for vomiting and blood in stool.  Genitourinary: Negative for hematuria and decreased urine volume.  Musculoskeletal: Negative for acute joint swelling Skin: Negative for color change and wound.  Neurological: Negative for tremors and numbness other than noted  Psychiatric/Behavioral: Negative for decreased concentration or  hyperactivity.      Objective:   Physical Exam BP 102/70  Pulse 93  Temp 97.2 F (36.2 C) (Oral)  SpO2 97% VS noted,  Constitutional: Pt appears well-developed and well-nourished.  HENT: Head: NCAT.  Right Ear: External ear normal.  Left Ear: External ear normal.  Bilat tm's with mild erythema.  Max sinus areas mild tender.  Pharynx with mild erythema, no exudate Eyes: Conjunctivae and EOM are normal. Pupils are equal, round, and reactive to light.  Neck: Normal range of motion. Neck supple.  Cardiovascular: Normal rate and regular rhythm.  with gr 2/6 sys murmur LUSB (I dont think noted previouly) Pulmonary/Chest: Effort normal and breath sounds normal.  Neurological: Pt is alert. Not confused , motor/gait intact Skin: Skin is warm. No erythema.  Psychiatric: Pt behavior is normal. Thought content normal. Mild nervous, not depressed affect      Assessment & Plan:

## 2012-02-07 NOTE — Patient Instructions (Addendum)
Please take all new medication as prescribed - the ceftin Please continue all other medications as before You will be contacted regarding the referral for: echocardiogram, and Lung testing (PFT's) Thank you for enrolling in MyChart. Please follow the instructions below to securely access your online medical record. MyChart allows you to send messages to your doctor, view your test results, renew your prescriptions, schedule appointments, and more.

## 2012-02-07 NOTE — Assessment & Plan Note (Signed)
Exam benign, but with symptoms sob/doe ? Clinical signficance and cxr with hyperinflation recently  - ? Copd or other; doubt asthma, for PFT's

## 2012-02-07 NOTE — Assessment & Plan Note (Signed)
?  new onset, doubt SBE but AoV not well visualized with last echo 2008 and pt with  Symptoms dizzy/doe - for echo repeat,  to f/u any worsening symptoms or concerns

## 2012-02-08 ENCOUNTER — Other Ambulatory Visit: Payer: Self-pay | Admitting: Internal Medicine

## 2012-02-14 ENCOUNTER — Ambulatory Visit (HOSPITAL_COMMUNITY): Payer: BC Managed Care – PPO | Attending: Internal Medicine | Admitting: Radiology

## 2012-02-14 ENCOUNTER — Other Ambulatory Visit: Payer: Self-pay | Admitting: Internal Medicine

## 2012-02-14 DIAGNOSIS — R42 Dizziness and giddiness: Secondary | ICD-10-CM | POA: Insufficient documentation

## 2012-02-14 DIAGNOSIS — R011 Cardiac murmur, unspecified: Secondary | ICD-10-CM | POA: Diagnosis not present

## 2012-02-14 DIAGNOSIS — I10 Essential (primary) hypertension: Secondary | ICD-10-CM | POA: Insufficient documentation

## 2012-02-14 DIAGNOSIS — R0600 Dyspnea, unspecified: Secondary | ICD-10-CM

## 2012-02-14 DIAGNOSIS — I080 Rheumatic disorders of both mitral and aortic valves: Secondary | ICD-10-CM | POA: Insufficient documentation

## 2012-02-14 DIAGNOSIS — R931 Abnormal findings on diagnostic imaging of heart and coronary circulation: Secondary | ICD-10-CM

## 2012-02-14 DIAGNOSIS — R0609 Other forms of dyspnea: Secondary | ICD-10-CM | POA: Insufficient documentation

## 2012-02-14 DIAGNOSIS — E785 Hyperlipidemia, unspecified: Secondary | ICD-10-CM | POA: Insufficient documentation

## 2012-02-14 DIAGNOSIS — I079 Rheumatic tricuspid valve disease, unspecified: Secondary | ICD-10-CM | POA: Diagnosis not present

## 2012-02-14 DIAGNOSIS — Z86718 Personal history of other venous thrombosis and embolism: Secondary | ICD-10-CM | POA: Diagnosis not present

## 2012-02-14 DIAGNOSIS — R0989 Other specified symptoms and signs involving the circulatory and respiratory systems: Secondary | ICD-10-CM | POA: Insufficient documentation

## 2012-02-14 NOTE — Progress Notes (Signed)
Echocardiogram performed.  

## 2012-02-16 ENCOUNTER — Encounter: Payer: Self-pay | Admitting: Internal Medicine

## 2012-02-23 ENCOUNTER — Ambulatory Visit (INDEPENDENT_AMBULATORY_CARE_PROVIDER_SITE_OTHER): Payer: BC Managed Care – PPO | Admitting: Internal Medicine

## 2012-02-23 DIAGNOSIS — R0609 Other forms of dyspnea: Secondary | ICD-10-CM

## 2012-02-23 DIAGNOSIS — R0989 Other specified symptoms and signs involving the circulatory and respiratory systems: Secondary | ICD-10-CM

## 2012-02-23 DIAGNOSIS — R011 Cardiac murmur, unspecified: Secondary | ICD-10-CM | POA: Diagnosis not present

## 2012-02-23 DIAGNOSIS — R42 Dizziness and giddiness: Secondary | ICD-10-CM | POA: Diagnosis not present

## 2012-02-23 DIAGNOSIS — R0600 Dyspnea, unspecified: Secondary | ICD-10-CM

## 2012-02-23 LAB — PULMONARY FUNCTION TEST

## 2012-02-23 NOTE — Progress Notes (Signed)
PFT done today. 

## 2012-02-26 ENCOUNTER — Telehealth: Payer: Self-pay | Admitting: Internal Medicine

## 2012-02-26 ENCOUNTER — Ambulatory Visit (INDEPENDENT_AMBULATORY_CARE_PROVIDER_SITE_OTHER): Payer: BC Managed Care – PPO | Admitting: Internal Medicine

## 2012-02-26 ENCOUNTER — Encounter: Payer: Self-pay | Admitting: Internal Medicine

## 2012-02-26 VITALS — BP 110/70 | HR 80 | Temp 97.0°F

## 2012-02-26 DIAGNOSIS — I10 Essential (primary) hypertension: Secondary | ICD-10-CM

## 2012-02-26 DIAGNOSIS — R079 Chest pain, unspecified: Secondary | ICD-10-CM

## 2012-02-26 DIAGNOSIS — I421 Obstructive hypertrophic cardiomyopathy: Secondary | ICD-10-CM | POA: Diagnosis not present

## 2012-02-26 DIAGNOSIS — R0789 Other chest pain: Secondary | ICD-10-CM | POA: Insufficient documentation

## 2012-02-26 MED ORDER — NADOLOL 20 MG PO TABS: 20.0000 mg | ORAL_TABLET | Freq: Every day | ORAL | Status: DC

## 2012-02-26 NOTE — Assessment & Plan Note (Addendum)
Apparently with HOCM per recent echo, may have significant genetic component, and symptoms related to some unusual exertions recently more than typical (walking dog ok, but rushing to the airport checkout from the parking lot causes symptoms);  For nadolol asd, stress testing given the discomfort related, and f/u with Dr Elease Hashimoto as planned later this wk  Note:  Total time for pt hx, exam, review of record with pt in the room, determination of diagnoses and plan for further eval and tx is > 40 min, with over 50% spent in coordination and counseling of patient

## 2012-02-26 NOTE — Telephone Encounter (Signed)
Called the patient informed of MD instructions.  [name] is still having pain and requested to See Dr. Jonny Ruiz.  Patient is scheduled today.

## 2012-02-26 NOTE — Telephone Encounter (Signed)
Hard to know how urgent this is since the cause of the CP has not been evaluated.  I would not except it to be related to her echo recently.  I think seeing Dr Elease Hashimoto Friday should be ok, but consider OV with me or regina if pain recurs

## 2012-02-26 NOTE — Telephone Encounter (Signed)
Pt had severe chest pain this weekend.  Did not go to the er.  Says she knows why because of the test she did.  She has an appt this Friday with Dr. Georgann Housekeeper.  Cardiology told her to call Dr. Jonny Ruiz and have him call cardiology to see if she can get in sooner.

## 2012-02-26 NOTE — Progress Notes (Signed)
Subjective:    Patient ID: [name], female    DOB: [birth year], 57 y.o.   MRN: [id number]  HPI  Here to f/u, with c/o mid and left sided chest discomfort/pressure mild to mod with increased exertion over typical such as yesterday when occurred with rushing in to the airport to catch a flight, improved with rest, husband asked her to stay home which she did and is seen today (he left on flight).Assoc with DOE at the time, but no radiation, palps', n/v, diaphoresis, dizziness or syncope.  Since resting after rushing into the airport episode, no further symptoms, and performed her usual nonrushed 2.5 mile per day walk with the dog without symptoms.   Past Medical History  Diagnosis Date  . Chronic sinus infection 04/12/2010  . Abdominal pain, epigastric 10/25/2007  . Abdominal pain, right lower quadrant 11/20/2006  . Abdominal pain, right upper quadrant 10/23/2006  . ADD 01/20/2009  . ADIPOSITY, LOCALIZED 10/15/2006  . ALLERGIC RHINITIS 10/15/2006  . ANGIOEDEMA 08/12/2008  . ANIMAL BITE 10/07/2009  . ANXIETY DEPRESSION 04/04/2007  . ANXIETY 10/15/2006  . ASTHMATIC BRONCHITIS, ACUTE 01/27/2008  . Cellulitis and abscess of leg, except foot 08/12/2007  . CELLULITIS, FACE 12/21/2008  . CHEST PAIN 06/14/2007  . COLECTOMY, PARTIAL, WITH ANASTOMOSIS, HX OF 10/15/2006  . COLONIC POLYPS, HX OF 10/15/2006  . Contusion of unspecified site 04/21/2008  . CYST, OVARIAN NEC/NOS 10/15/2006  . Dengue 02/22/2010  . DEPRESSION 10/15/2006  . DVT, HX OF 10/15/2006  . DYSAUTONOMIA 10/15/2006  . Dysfunction of eustachian tube 05/26/2009  . EAR PAIN, BILATERAL 08/16/2007  . GI BLEEDING 04/04/2007  . Glossitis 03/06/2008  . HEADACHE, CHRONIC 04/04/2007  . Hematemesis 10/25/2007  . HEMORRHOIDS 04/04/2007  . HYPERLIPIDEMIA 12/12/2007  . HYPERTENSION 10/15/2006  . HYPOTHYROIDISM 03/06/2008  . IBS 04/04/2007  . MASTECTOMY, BILATERAL, HX OF 08/12/2009  . NECK MASS 07/01/2007  . NEOP, MALIGNANT, FEMALE BREAST NOS 10/15/2006  .  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 OTITIS MEDIA, ACUTE, BILATERAL 12/12/2007  . RASH-NONVESICULAR 02/13/2008  . RECTAL BLEEDING 04/04/2007  . SHY-DRAGER SYNDROME 01/17/2007  . SINUSITIS- ACUTE-NOS 10/15/2006  . SYNCOPE 07/01/2007  . URI 07/29/2007  . VERTIGO 01/17/2007  . VIRAL INFECTION 02/17/2010  . VITAMIN D DEFICIENCY 03/06/2008  . Wheezing 08/20/2008  . WOUND, OPEN, LEG, WITHOUT COMPLICATION 08/16/2007   Past Surgical History  Procedure Laterality Date  . Abdominal hysterectomy    . Oophorectomy    . Cholecystectomy    . Spigellian hernia    . Multiple ablations    . Bilateral mastectomy/reconstruction    . Cesarean section      reports that she has never smoked. She does not have any smokeless tobacco history on file. She reports that she does not drink alcohol or use illicit drugs. family history includes Cancer in her sister. Allergies  Allergen Reactions  . Penicillins     REACTION: shock   Current Outpatient Prescriptions on File Prior to Visit  Medication Sig Dispense Refill  . acetaminophen-codeine (TYLENOL #3) 300-30 MG per tablet TAKE 1 TABLET BY MOUTH 4 TIMES A DAY AS NEEDED FOR PAIN  120 tablet  2  . celecoxib (CELEBREX) 200 MG capsule Take 200 mg by mouth 2 (two) times daily.        . fexofenadine (ALLEGRA) 180 MG tablet Take 1 tablet (180 mg total) by mouth daily.  90 tablet  3  . fluticasone (FLONASE) 50 MCG/ACT nasal spray Place 2 sprays into the nose daily.  16 g  11  . levothyroxine (SYNTHROID, LEVOTHROID) 50 MCG tablet TAKE 1 TABLET EVERY DAY  90 tablet  3  . losartan (COZAAR) 100 MG tablet TAKE 1 TABLET EVERY DAY  30 tablet  10  . methylphenidate (CONCERTA) 36 MG CR tablet Take 1 tablet (36 mg total) by mouth daily. To fill July 31, 2010  30 tablet  0  . venlafaxine XR (EFFEXOR-XR) 150 MG 24 hr capsule Take 1 capsule (150 mg total) by mouth daily.  30 capsule  11  . zolpidem (AMBIEN) 10 MG tablet Take 1 tablet (10 mg total) by mouth at bedtime as needed.  30  tablet  5   No current facility-administered medications on file prior to visit.   Review of Systems  Constitutional: Negative for unexpected weight change, or unusual diaphoresis  HENT: Negative for tinnitus.   Eyes: Negative for photophobia and visual disturbance.  Respiratory: Negative for choking and stridor.   Gastrointestinal: Negative for vomiting and blood in stool.  Genitourinary: Negative for hematuria and decreased urine volume.  Musculoskeletal: Negative for acute joint swelling Skin: Negative for color change and wound.  Neurological: Negative for tremors and numbness other than noted       Objective:   Physical Exam BP 110/70  Pulse 80  Temp(Src) 97 F (36.1 C) (Oral)  SpO2 99% VS noted,  Constitutional: Pt appears well-developed and well-nourished.  HENT: Head: NCAT.  Right Ear: External ear normal.  Left Ear: External ear normal.  Eyes: Conjunctivae and EOM are normal. Pupils are equal, round, and reactive to light.  Neck: Normal range of motion. Neck supple.  Cardiovascular: Normal rate and regular rhythm.   Pulmonary/Chest: Effort normal and breath sounds normal. - no rales or wheezing  Neurological: Pt is alert. Not confused  Skin: Skin is warm. No erythema.  Psychiatric: Pt behavior is normal. Thought content normal.     Assessment & Plan:

## 2012-02-26 NOTE — Assessment & Plan Note (Addendum)
ECG reviewed as per emr, atypical in that is exertional but not clear anginal, may be more related to HOCM since only occurs with increased exertional levels over typical and assoc with DOE, for stress test, start nadolol as above, and f/u with Card as planned

## 2012-02-26 NOTE — Patient Instructions (Signed)
OK to start the nadolol at 20 mg per day OK to stop the losartan 100 mg per day, as the combination would likely be too much for the blood pressure You will be contacted regarding the referral for: stress test Please avoid unusual exertion such as rushing to get to the airport from the parking lot Please keep your appointments with your specialists as you have planned - Dr Elease Hashimoto

## 2012-02-26 NOTE — Assessment & Plan Note (Signed)
To stop the losartan in the setting of starting nadolol,  to f/u any worsening symptoms or concerns BP Readings from Last 3 Encounters:  02/26/12 110/70  02/07/12 102/70  12/13/11 100/62

## 2012-02-27 ENCOUNTER — Other Ambulatory Visit: Payer: Self-pay | Admitting: *Deleted

## 2012-03-01 ENCOUNTER — Encounter: Payer: Self-pay | Admitting: Cardiovascular Disease

## 2012-03-01 ENCOUNTER — Ambulatory Visit (INDEPENDENT_AMBULATORY_CARE_PROVIDER_SITE_OTHER): Payer: BC Managed Care – PPO

## 2012-03-01 ENCOUNTER — Ambulatory Visit (INDEPENDENT_AMBULATORY_CARE_PROVIDER_SITE_OTHER): Payer: BC Managed Care – PPO | Admitting: Cardiovascular Disease

## 2012-03-01 VITALS — BP 110/78 | HR 70 | Ht 69.0 in | Wt 202.0 lb

## 2012-03-01 DIAGNOSIS — I421 Obstructive hypertrophic cardiomyopathy: Secondary | ICD-10-CM | POA: Diagnosis not present

## 2012-03-01 DIAGNOSIS — R079 Chest pain, unspecified: Secondary | ICD-10-CM

## 2012-03-01 LAB — BASIC METABOLIC PANEL
CO2: 29 mEq/L (ref 19–32)
Calcium: 9.3 mg/dL (ref 8.4–10.5)
Chloride: 105 mEq/L (ref 96–112)
Glucose, Bld: 97 mg/dL (ref 70–99)
Potassium: 4 mEq/L (ref 3.5–5.1)
Sodium: 139 mEq/L (ref 135–145)

## 2012-03-01 MED ORDER — METOPROLOL TARTRATE 50 MG PO TABS: 25.0000 mg | ORAL_TABLET | Freq: Two times a day (BID) | ORAL | Status: DC

## 2012-03-01 NOTE — Progress Notes (Signed)
[name] Date of Birth  April 05, 1955       [facility] Office [address], [address]  [address] [place], [place]  [address]   [place], [place]  [address] [phone]     [phone]   Fax  [phone]    Fax [phone]  Problem List: 1. LVH- with LVOT gradient 2. Breast cancer - s/p bilateral breast reconstruction 3. Hx of dysautonomia 4. Hx of SVT - s/p multiple ablations at [facility] 5. Thymoma - surgery 1999  History of Present Illness:  [name] is a 57 yo who presents today for further evaluation of an abnormal echo. She was noted to have a heart murmur on exam. The echocardiogram reveals left ventricular hypertrophy with an LVOT gradient.  Her symptoms are fairly consistent. She has severe chest pain and shortness breath whenever she gets up and moves fairly quickly such as trying to catch a connecting flight at the airport or walking up a slight incline.  She's able to walk with her dogs to have miles at a leisurely pace and not have any problems.  She is a former Water engineer.  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 ( [employer]).  She has a strong family history of sudden cardiac death ( Mother, grandmother, uncle)   Current Outpatient Prescriptions on File Prior to Visit  Medication Sig Dispense Refill  . acetaminophen-codeine (TYLENOL #3) 300-30 MG per tablet TAKE 1 TABLET BY MOUTH 4 TIMES A DAY AS NEEDED FOR PAIN  120 tablet  2  . cefUROXime (CEFTIN) 250 MG tablet as needed.       . celecoxib (CELEBREX) 200 MG capsule Take 200 mg by mouth as needed.       . ciprofloxacin (CIPRO) 500 MG tablet as needed.       . doxycycline (VIBRA-TABS) 100 MG tablet as needed.       [name] levothyroxine (SYNTHROID, LEVOTHROID) 50 MCG tablet TAKE 1 TABLET EVERY DAY  90 tablet  3  . nadolol (CORGARD) 20 MG tablet Take 1 tablet (20 mg total) by mouth daily.  90 tablet  3  . venlafaxine XR (EFFEXOR-XR) 150 MG 24 hr  capsule Take 1 capsule (150 mg total) by mouth daily.  30 capsule  11  . zolpidem (AMBIEN) 10 MG tablet Take 1 tablet (10 mg total) by mouth at bedtime as needed.  30 tablet  5  . levofloxacin (LEVAQUIN) 250 MG tablet        No current facility-administered medications on file prior to visit.    Allergies  Allergen Reactions  . Penicillins     REACTION: shock    Past Medical History  Diagnosis Date  . Chronic sinus infection 04/12/2010  . Abdominal pain, epigastric 10/25/2007  . Abdominal pain, right [name] quadrant 11/20/2006  . Abdominal pain, right upper quadrant 10/23/2006  . ADD 01/20/2009  . ADIPOSITY, LOCALIZED 10/15/2006  . ALLERGIC RHINITIS 10/15/2006  . ANGIOEDEMA 08/12/2008  . ANIMAL BITE 10/07/2009  . ANXIETY DEPRESSION 04/04/2007  . ANXIETY 10/15/2006  . ASTHMATIC BRONCHITIS, ACUTE 01/27/2008  . Cellulitis and abscess of leg, except foot 08/12/2007  . CELLULITIS, FACE 12/21/2008  . CHEST PAIN 06/14/2007  . COLECTOMY, PARTIAL, WITH ANASTOMOSIS, HX OF 10/15/2006  . COLONIC POLYPS, HX OF 10/15/2006  . Contusion of unspecified site 04/21/2008  . CYST, OVARIAN NEC/NOS 10/15/2006  . Dengue 02/22/2010  . DEPRESSION 10/15/2006  . DVT,  HX OF 10/15/2006  . DYSAUTONOMIA 10/15/2006  . Dysfunction of eustachian tube 05/26/2009  . EAR PAIN, BILATERAL 08/16/2007  . GI BLEEDING 04/04/2007  . Glossitis 03/06/2008  . HEADACHE, CHRONIC 04/04/2007  . Hematemesis 10/25/2007  . HEMORRHOIDS 04/04/2007  . HYPERLIPIDEMIA 12/12/2007  . HYPERTENSION 10/15/2006  . HYPOTHYROIDISM 03/06/2008  . IBS 04/04/2007  . MASTECTOMY, BILATERAL, HX OF 08/12/2009  . NECK MASS 07/01/2007  . NEOP, MALIGNANT, FEMALE BREAST NOS 10/15/2006  . 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 OTITIS MEDIA, ACUTE, BILATERAL 12/12/2007  . RASH-NONVESICULAR 02/13/2008  . RECTAL BLEEDING 04/04/2007  . SHY-DRAGER SYNDROME 01/17/2007  . SINUSITIS- ACUTE-NOS 10/15/2006  . SYNCOPE 07/01/2007  . URI 07/29/2007  . VERTIGO 01/17/2007  . VIRAL  INFECTION 02/17/2010  . VITAMIN D DEFICIENCY 03/06/2008  . Wheezing 08/20/2008  . WOUND, OPEN, LEG, WITHOUT COMPLICATION 08/16/2007    Past Surgical History  Procedure Laterality Date  . Abdominal hysterectomy    . Oophorectomy    . Cholecystectomy    . Spigellian hernia    . Multiple ablations    . Bilateral mastectomy/reconstruction    . Cesarean section      History  Smoking status  . Never Smoker   Smokeless tobacco  . Not on file    History  Alcohol Use No    Family History  Problem Relation Age of Onset  . Cancer Sister     ovarian cancer    Reviw of Systems:  Reviewed in the HPI.  All other systems are negative.  Physical Exam: Blood pressure 110/78, pulse 70, height 5\' 9"  (1.753 m), weight 202 lb (91.627 kg), SpO2 98.00%. General: Well developed, well nourished, in no acute distress.  Head: Normocephalic, atraumatic, sclera non-icteric, mucus membranes are moist,   Neck: Supple. Carotids are 2 + without bruits. No JVD   Lungs: Clear   Heart: Regular rate, normal S1, normal S2. She has a very soft systolic murmur. The murmur increases after 1 minute step test.  Abdomen: Soft, non-tender, non-distended with normal bowel sounds.  Msk:  Strength and tone are normal   Extremities: No clubbing or cyanosis. No edema.  Distal pedal pulses are 2+ and equal    Neuro: CN II - XII intact.  Alert and oriented X 3.   Psych:  Normal   ECG: 02/26/2012: Normal sinus rhythm at 81. She has nonspecific ST and T-wave changes. This is likely related to let LVH with repolarization changes.  Assessment / Plan:

## 2012-03-01 NOTE — Patient Instructions (Addendum)
Your physician recommends that you schedule a follow-up appointment in: 2 WEEKS WITH LORI OR DR [facility] 03/12/12  Your physician has requested that you have a cardiac MRI. Cardiac MRI uses a computer to create images of your heart as its beating, producing both still and moving pictures of your heart and major blood vessels. Please follow the instruction sheet given to you today for more information.    Your physician has recommended that you wear an event monitor. Event monitors are medical devices that record the heart's electrical activity. Doctors most often [place] these monitors to diagnose arrhythmias. Arrhythmias are problems with the speed or rhythm of the heartbeat. The monitor is a small, portable device. You can wear one while you do your normal daily activities. This is usually used to diagnose what is causing palpitations/syncope (passing out).  Your physician has recommended you make the following change in your medication:   STOP CORGARD START METOPROLOL 50 MG TAB TAKE A HALF TABLET (25 MG) TWICE DAILY, 12 HOURS APART  You have been referred to DR [facility] IS AWARE OF PATIENT AND WILL ACCEPT APPOINTMENT

## 2012-03-01 NOTE — Assessment & Plan Note (Signed)
[name] has symptoms and an echo consistent with hypertrophic obstructive cardiomyopathy. She's not had any episodes of syncope but she developed severe shortness breath and chest pain with any sort of exertion. She has a documented left ventricular outflow tract gradient. Her murmur increases after exercise.  We will change her Corgard to metoprolol. We will slowly titrate up her dose of metoprolol. We will get an MRI with gadolinium with delayed enhancement. We'll have her see Dr. Graciela Husbands for further testing to see if she needs an ICD. He will also discuss with her genetic testing.  We'll need to get a stress test on her. I would prefer that we get her adequately beta blocked before we perform a stress test.  We'll also need to place an event monitor on her.  We will see her in 2 weeks to up titrate her metoprolol.

## 2012-03-04 ENCOUNTER — Telehealth: Payer: Self-pay | Admitting: Cardiovascular Disease

## 2012-03-04 NOTE — Telephone Encounter (Signed)
New Prob   Pt was prescribed metoprolol and she states it made her so dizzy she couldn't hold her head up, and made her nauseated. She stopedp taking the medication. Would like to speak to nurse.

## 2012-03-04 NOTE — Telephone Encounter (Signed)
Pt is in the middle of a house move, has been dehydrated, told her to increase fluids, take with food, and can break in half for a couple days to get accustom to metoprolol, told her to call with further questions or concerns, she agreed to plan.

## 2012-03-05 ENCOUNTER — Encounter: Payer: Self-pay | Admitting: Cardiovascular Disease

## 2012-03-12 ENCOUNTER — Ambulatory Visit (INDEPENDENT_AMBULATORY_CARE_PROVIDER_SITE_OTHER): Payer: BC Managed Care – PPO | Admitting: Nurse Practitioner

## 2012-03-12 ENCOUNTER — Encounter: Payer: Self-pay | Admitting: Nurse Practitioner

## 2012-03-12 VITALS — BP 130/74 | HR 88 | Ht 69.0 in | Wt 198.1 lb

## 2012-03-12 DIAGNOSIS — I421 Obstructive hypertrophic cardiomyopathy: Secondary | ICD-10-CM

## 2012-03-12 MED ORDER — ATENOLOL 25 MG PO TABS: 25.0000 mg | ORAL_TABLET | Freq: Two times a day (BID) | ORAL | Status: DC

## 2012-03-12 NOTE — Progress Notes (Signed)
Jessica [name] Date of Birth: 05/30/1955 Medical Record #[id number]  History of Present Illness: Jessica [name] is seen back today for a follow up visit. She is seen with Dr. Elease Hashimoto. She has multiple medical issues which are as noted below. She has had breast cancer and had bilater breast surgeries, SVT with multiple ablations at [facility], thymoma, dysautonomia and most recently noted to have HOCM. She has had issues with chest pain and shortness of breath with activity. No syncope reported. Family history reportedly positive for sudden death Teacher, early years/pre, uncle).   Seen here at the end of February. Started beta blocker therapy. Was to be referred to Dr. Graciela Husbands for MRI with gadolinium, possible ICD and genetic testing. Ideally wanted to get her fully on beta blocker before doing a stress test. Event monitor was also placed.   She comes back today. She is here alone. MRI is scheduled for tomorrow. Seeing Dr. Graciela Husbands later this month. Monitor is suppose to arrive tomorrow. Has not tolerated the metoprolol. Had been in the process of a move and got dehydrated. The metoprolol made her too dizzy and she could not hold her head up. She was advised to break in half and work the dose back up along with correction of her volume status. She has only been able to take just a quarter of the tablet twice a day. Still in the process of moving and still has issues with her chest pain and shortness of breath. No real improvement in her symptoms. Says she has tolerated Atenolol in the past. Her son has tested positive apparently as well.   Current Outpatient Prescriptions on File Prior to Visit  Medication Sig Dispense Refill  . acetaminophen-codeine (TYLENOL #3) 300-30 MG per tablet TAKE 1 TABLET BY MOUTH 4 TIMES A DAY AS NEEDED FOR PAIN  120 tablet  2  . cefUROXime (CEFTIN) 250 MG tablet as needed.       . celecoxib (CELEBREX) 200 MG capsule Take 200 mg by mouth as needed.       . ciprofloxacin (CIPRO) 500 MG tablet  as needed.       . doxycycline (VIBRA-TABS) 100 MG tablet as needed.       . fexofenadine (ALLEGRA) 180 MG tablet Take 180 mg by mouth as needed.      . fluticasone (FLONASE) 50 MCG/ACT nasal spray Place 2 sprays into the nose daily. During Allergy Season      . levothyroxine (SYNTHROID, LEVOTHROID) 50 MCG tablet TAKE 1 TABLET EVERY DAY  90 tablet  3  . venlafaxine XR (EFFEXOR-XR) 150 MG 24 hr capsule Take 1 capsule (150 mg total) by mouth daily.  30 capsule  11  . zolpidem (AMBIEN) 10 MG tablet Take 1 tablet (10 mg total) by mouth at bedtime as needed.  30 tablet  5   No current facility-administered medications on file prior to visit.    Allergies  Allergen Reactions  . Latex     More than a day causes blisters  . Penicillins     REACTION: shock    Past Medical History  Diagnosis Date  . Chronic sinus infection 04/12/2010  . Abdominal pain, epigastric 10/25/2007  . Abdominal pain, right [name] quadrant 11/20/2006  . Abdominal pain, right upper quadrant 10/23/2006  . ADD 01/20/2009  . ADIPOSITY, LOCALIZED 10/15/2006  . ALLERGIC RHINITIS 10/15/2006  . ANGIOEDEMA 08/12/2008  . ANIMAL BITE 10/07/2009  . ANXIETY DEPRESSION 04/04/2007  . ANXIETY 10/15/2006  . ASTHMATIC BRONCHITIS, ACUTE 01/27/2008  .  Cellulitis and abscess of leg, except foot 08/12/2007  . CELLULITIS, FACE 12/21/2008  . CHEST PAIN 06/14/2007  . COLECTOMY, PARTIAL, WITH ANASTOMOSIS, HX OF 10/15/2006  . COLONIC POLYPS, HX OF 10/15/2006  . Contusion of unspecified site 04/21/2008  . CYST, OVARIAN NEC/NOS 10/15/2006  . Dengue 02/22/2010  . DEPRESSION 10/15/2006  . DVT, HX OF 10/15/2006  . DYSAUTONOMIA 10/15/2006  . Dysfunction of eustachian tube 05/26/2009  . EAR PAIN, BILATERAL 08/16/2007  . GI BLEEDING 04/04/2007  . Glossitis 03/06/2008  . HEADACHE, CHRONIC 04/04/2007  . Hematemesis 10/25/2007  . HEMORRHOIDS 04/04/2007  . HYPERLIPIDEMIA 12/12/2007  . HYPERTENSION 10/15/2006  . HYPOTHYROIDISM 03/06/2008  . IBS 04/04/2007  .  MASTECTOMY, BILATERAL, HX OF 08/12/2009  . NECK MASS 07/01/2007  . NEOP, MALIGNANT, FEMALE BREAST NOS 10/15/2006  . 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 OTITIS MEDIA, ACUTE, BILATERAL 12/12/2007  . RASH-NONVESICULAR 02/13/2008  . RECTAL BLEEDING 04/04/2007  . SHY-DRAGER SYNDROME 01/17/2007  . SINUSITIS- ACUTE-NOS 10/15/2006  . SYNCOPE 07/01/2007  . URI 07/29/2007  . VERTIGO 01/17/2007  . VIRAL INFECTION 02/17/2010  . VITAMIN D DEFICIENCY 03/06/2008  . Wheezing 08/20/2008  . WOUND, OPEN, LEG, WITHOUT COMPLICATION 08/16/2007    Past Surgical History  Procedure Laterality Date  . Abdominal hysterectomy    . Oophorectomy    . Cholecystectomy    . Spigellian hernia    . Multiple ablations    . Bilateral mastectomy/reconstruction    . Cesarean section      History  Smoking status  . Never Smoker   Smokeless tobacco  . Not on file    History  Alcohol Use No    Family History  Problem Relation Age of Onset  . Cancer Sister     ovarian cancer    Review of Systems: The review of systems is per the HPI.  All other systems were reviewed and are negative.  Physical Exam: BP 130/74  Pulse 88  Ht 5\' 9"  (1.753 m)  Wt 198 lb 1.9 oz (89.867 kg)  BMI 29.24 kg/m2 Patient is very pleasant and in no acute distress. Skin is warm and dry. Color is normal.  HEENT is unremarkable. Normocephalic/atraumatic. PERRL. Sclera are nonicteric. Neck is supple. No masses. No JVD. Lungs are clear. Cardiac exam shows a regular rate and rhythm. Systolic murmur noted. Abdomen is soft. Extremities are without edema. Gait and ROM are intact. No gross neurologic deficits noted.  LABORATORY DATA:  Lab Results  Component Value Date   WBC 8.7 12/13/2011   HGB 14.9 12/13/2011   HCT 43.7 12/13/2011   PLT 322.0 12/13/2011   GLUCOSE 97 03/01/2012   CHOL 209* 12/13/2011   TRIG 195.0* 12/13/2011   HDL 45.10 12/13/2011   LDLDIRECT 137.4 12/13/2011   LDLCALC 73 02/11/2008   ALT 28 12/13/2011   AST 28 12/13/2011    NA 139 03/01/2012   K 4.0 03/01/2012   CL 105 03/01/2012   CREATININE 0.9 03/01/2012   BUN 13 03/01/2012   CO2 29 03/01/2012   TSH 2.77 12/13/2011   INR 1.0 12/03/2006   HGBA1C  Value: 5.2 (NOTE)   The ADA recommends the following therapeutic goal for glycemic   control related to Hgb A1C measurement:   Goal of Therapy:   < 7.0% Hgb A1C   Reference: American Diabetes Association: Clinical Practice   Recommendations 2008, Diabetes Care,  2008, 31:(Suppl 1). 08/12/2007    Assessment / Plan:  HOCM - for MRI study tomorrow - for consult with  Dr. Graciela Husbands later this month for discussion of ICD and genetic testing. She is only able to take just very low dose beta blocker. No real improvement in her symptoms at this time yet. Still in the process of moving and this has made titration of her medicines hard. She admits that she has had trouble with her volume status due to the move as well. This should be short term. She says she has tolerated Atenolol in the past. Would try Atenolol 25 mg BID. Proceed with her MRI tomorrow and see Dr. Graciela Husbands later this month. To see Dr. Elease Hashimoto in 4 to 6 weeks.   Patient is agreeable to this plan and will call if any problems develop in the interim.

## 2012-03-12 NOTE — Patient Instructions (Addendum)
Stay well hydrated.   Stay on your current medicines except stop the Metoprolol  We will try Atenolol 25 mg two times a day - this is at the drug store   Proceed with your MRI tomorrow and see Dr. Graciela Husbands later this month as planned  See Dr. Elease Hashimoto in 4 to 6 weeks  Call the [facility] office at [phone] if you have any questions, problems or concerns.

## 2012-03-13 ENCOUNTER — Ambulatory Visit (HOSPITAL_COMMUNITY)
Admission: RE | Admit: 2012-03-13 | Discharge: 2012-03-13 | Disposition: A | Discharge status: Home / Self Care | Payer: BC Managed Care – PPO | Source: Ambulatory Visit | Attending: Cardiovascular Disease | Admitting: Cardiovascular Disease

## 2012-03-13 DIAGNOSIS — I422 Other hypertrophic cardiomyopathy: Secondary | ICD-10-CM | POA: Insufficient documentation

## 2012-03-13 DIAGNOSIS — I421 Obstructive hypertrophic cardiomyopathy: Secondary | ICD-10-CM | POA: Diagnosis not present

## 2012-03-13 MED ORDER — GADOBENATE DIMEGLUMINE 529 MG/ML IV SOLN
28.0000 mL | Freq: Once | INTRAVENOUS | Status: AC | PRN
  Administered 2012-03-13: 28 mL via INTRAVENOUS

## 2012-03-14 NOTE — Progress Notes (Signed)
Enrolled patient for monitor to be mailed to their home 

## 2012-03-15 ENCOUNTER — Telehealth: Payer: Self-pay | Admitting: Cardiovascular Disease

## 2012-03-15 NOTE — Telephone Encounter (Signed)
New Problem:    Patient called in returning your call. Please call back. 

## 2012-03-18 NOTE — Telephone Encounter (Signed)
Pt was called back

## 2012-03-19 DIAGNOSIS — R079 Chest pain, unspecified: Secondary | ICD-10-CM

## 2012-03-20 ENCOUNTER — Telehealth: Payer: Self-pay | Admitting: Cardiovascular Disease

## 2012-03-20 NOTE — Telephone Encounter (Signed)
New Problem   C/o side effects of medication atenolol 25 mg

## 2012-03-20 NOTE — Telephone Encounter (Signed)
LMTCB

## 2012-03-25 ENCOUNTER — Ambulatory Visit: Payer: BC Managed Care – PPO | Admitting: Internal Medicine

## 2012-03-27 ENCOUNTER — Ambulatory Visit: Payer: BC Managed Care – PPO | Admitting: Internal Medicine

## 2012-03-29 ENCOUNTER — Encounter: Payer: Self-pay | Admitting: Internal Medicine

## 2012-03-29 ENCOUNTER — Ambulatory Visit (INDEPENDENT_AMBULATORY_CARE_PROVIDER_SITE_OTHER): Payer: BC Managed Care – PPO | Admitting: Internal Medicine

## 2012-03-29 VITALS — BP 110/82 | HR 77 | Ht 69.0 in | Wt 200.0 lb

## 2012-03-29 DIAGNOSIS — I422 Other hypertrophic cardiomyopathy: Secondary | ICD-10-CM

## 2012-03-29 DIAGNOSIS — I421 Obstructive hypertrophic cardiomyopathy: Secondary | ICD-10-CM | POA: Diagnosis not present

## 2012-03-29 NOTE — Assessment & Plan Note (Signed)
Patient is identified with HCM with wall thickness 19mm up from 12 mm in 2008  We had lengthy discussion Re: disease as well as implications of the family. We'll undertake genetic testing and stress testing. She also needs a 48 hour Holter monitor for risk stratification. The multitude of relatives in the second degree who have died. No first degree relatives old which I nowhere. Her eldest son  Has in [place] been diagnosed also with HCM confirming diagnosis which I was otherwise concerned that might be a phenocopy based of the rapid progression noted in the interval 6 years  She is not tolerating the atenolol. She previously did not tolerate nadolol I have given her prescription today for verapamil as well as Inderal LA. We've also given a prescription for metoprolol. Metoprolol would be another option. We will see how she does from a side effect profile

## 2012-03-29 NOTE — Patient Instructions (Addendum)
Your physician has requested that you have an exercise tolerance test. For further information please visit [email or web address] Please also follow instruction sheet, as given. WITH DR Graciela Husbands  Your physician has recommended that you wear a holter monitor. Holter monitors are medical devices that record the heart's electrical activity. Doctors most often use these monitors to diagnose arrhythmias. Arrhythmias are problems with the speed or rhythm of the heartbeat. The monitor is a small, portable device. You can wear one while you do your normal daily activities. This is usually used to diagnose what is causing palpitations/syncope (passing out). (WE WILL REMOVE THE EVENT AND REPLACE WITH A HOLTER MONITOR 24 HOURS).  Your physician has recommended you make the following change in your medication: TRY Bisoprolol 2.5 mg - Inderal LA 60 mg - Verapamil 120mg 

## 2012-03-29 NOTE — Progress Notes (Signed)
ELECTROPHYSIOLOGY CONSULT NOTE  Patient ID: [name], MRN: [id number], DOB/AGE: 1955/09/06 57 y.o. Admit date: (Not on file) Date of Consult: 03/29/2012  Primary Physician: Oliver Barre, MD Primary Cardiologist: [facility] Chief Complaint: hcm   HPI   [name] is a 57 y.o. female   Seen at her hiatus of the years. Her best course was notable for atrial arrhythmias which she underwent an ablation's as well as the development of autonomic insufficiency following colon surgery. This was healed by x-ray or a couple of years ago.  She was noted recently to have an abnormal heart murmur and she was admitted for echocardiogram that demonstrated left ventricular hypertrophy with a gradient outflow. This was prompted also by development of chest pain shortness of breath particularly with exertion up inclines.  Echocardiogram demonstrated 19 mm walls. An echocardiogram a number of years ago had 12 mm walls. Since the diagnosis was suggested in her oldest son, in [place], has also been diagnosed with HCM supporting the diagnosis. S   Past Medical History  Diagnosis Date  . Chronic sinus infection 04/12/2010  . Abdominal pain, epigastric 10/25/2007  . Abdominal pain, right lower quadrant 11/20/2006  . Abdominal pain, right upper quadrant 10/23/2006  . ADD 01/20/2009  . ADIPOSITY, LOCALIZED 10/15/2006  . ALLERGIC RHINITIS 10/15/2006  . ANGIOEDEMA 08/12/2008  . ANIMAL BITE 10/07/2009  . ANXIETY DEPRESSION 04/04/2007  . ANXIETY 10/15/2006  . ASTHMATIC BRONCHITIS, ACUTE 01/27/2008  . Cellulitis and abscess of leg, except foot 08/12/2007  . CELLULITIS, FACE 12/21/2008  . CHEST PAIN 06/14/2007  . COLECTOMY, PARTIAL, WITH ANASTOMOSIS, HX OF 10/15/2006  . COLONIC POLYPS, HX OF 10/15/2006  . Contusion of unspecified site 04/21/2008  . CYST, OVARIAN NEC/NOS 10/15/2006  . Dengue 02/22/2010  . DEPRESSION 10/15/2006  . DVT, HX OF 10/15/2006  . DYSAUTONOMIA 10/15/2006  . Dysfunction of eustachian tube  05/26/2009  . EAR PAIN, BILATERAL 08/16/2007  . GI BLEEDING 04/04/2007  . Glossitis 03/06/2008  . HEADACHE, CHRONIC 04/04/2007  . Hematemesis 10/25/2007  . HEMORRHOIDS 04/04/2007  . HYPERLIPIDEMIA 12/12/2007  . HYPERTENSION 10/15/2006  . HYPOTHYROIDISM 03/06/2008  . IBS 04/04/2007  . MASTECTOMY, BILATERAL, HX OF 08/12/2009  . NECK MASS 07/01/2007  . NEOP, MALIGNANT, FEMALE BREAST NOS 10/15/2006  . 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 OTITIS MEDIA, ACUTE, BILATERAL 12/12/2007  . RASH-NONVESICULAR 02/13/2008  . RECTAL BLEEDING 04/04/2007  . SHY-DRAGER SYNDROME 01/17/2007  . SINUSITIS- ACUTE-NOS 10/15/2006  . SYNCOPE 07/01/2007  . URI 07/29/2007  . VERTIGO 01/17/2007  . VIRAL INFECTION 02/17/2010  . VITAMIN D DEFICIENCY 03/06/2008  . Wheezing 08/20/2008  . WOUND, OPEN, LEG, WITHOUT COMPLICATION 08/16/2007      Surgical History:  Past Surgical History  Procedure Laterality Date  . Abdominal hysterectomy    . Oophorectomy    . Cholecystectomy    . Spigellian hernia    . Multiple ablations    . Bilateral mastectomy/reconstruction    . Cesarean section       Home Meds: Prior to Admission medications   Medication Sig Start Date End Date Taking? Authorizing Provider  acetaminophen-codeine (TYLENOL #3) 300-30 MG per tablet TAKE 1 TABLET BY MOUTH 4 TIMES A DAY AS NEEDED FOR PAIN 01/10/12  Yes Corwin Levins, MD  atenolol (TENORMIN) 25 MG tablet Take 1 tablet (25 mg total) by mouth 2 (two) times daily. 03/12/12  Yes Rosalio Macadamia, NP  cefUROXime (CEFTIN) 250 MG tablet as needed.  02/07/12  Yes Historical Provider, MD  celecoxib (CELEBREX) 200 MG capsule Take 200 mg by mouth as needed.    Yes Historical Provider, MD  ciprofloxacin (CIPRO) 500 MG tablet as needed.  02/03/12  Yes Historical Provider, MD  doxycycline (VIBRA-TABS) 100 MG tablet as needed.  01/14/12  Yes Historical Provider, MD  fexofenadine (ALLEGRA) 180 MG tablet Take 180 mg by mouth as needed. 06/29/11  Yes Corwin Levins, MD  fluticasone (FLONASE) 50  MCG/ACT nasal spray Place 2 sprays into the nose daily. During Allergy Season 06/29/11  Yes Corwin Levins, MD  levothyroxine (SYNTHROID, LEVOTHROID) 50 MCG tablet TAKE 1 TABLET EVERY DAY 06/08/11  Yes Corwin Levins, MD  venlafaxine XR (EFFEXOR-XR) 150 MG 24 hr capsule Take 1 capsule (150 mg total) by mouth daily. 11/04/11  Yes Corwin Levins, MD  zolpidem (AMBIEN) 10 MG tablet Take 1 tablet (10 mg total) by mouth at bedtime as needed. 10/03/10  Yes Corwin Levins, MD      Allergies:  Allergies  Allergen Reactions  . Latex     More than a day causes blisters  . Penicillins     REACTION: shock    History   Social History  . Marital Status: Married    Spouse Name: N/A    Number of Children: N/A  . Years of Education: N/A   Occupational History  . Not on file.   Social History Main Topics  . Smoking status: Never Smoker   . Smokeless tobacco: Not on file  . Alcohol Use: No  . Drug Use: No  . Sexually Active: Not on file   Other Topics Concern  . Not on file   Social History Narrative   [place] a few times per year.   Non smoking           Family History  Problem Relation Age of Onset  . Cancer Sister     ovarian cancer     ROS:  Please see the history of present illness.     All other systems reviewed and negative.    Physical Exam:   Blood pressure 110/82, pulse 77, height 5\' 9"  (1.753 m), weight 200 lb (90.719 kg). General: Well developed, well nourished female in no acute distress. Head: Normocephalic, atraumatic, sclera non-icteric, no xanthomas, nares are without discharge. EENT: normal Lymph Nodes:  none Back: without scoliosis/kyphosis , no CVA tendersness Neck: Negative for carotid bruits. JVD not elevated. Lungs: Clear bilaterally to auscultation without wheezes, rales, or rhonchi. Breathing is unlabored. Heart: RRR with S1 S2.  2*/6 systolic  murmu r this increases with Valsalva rubs, or gallops appreciated. Abdomen: Soft, non-tender, non-distended  with normoactive bowel sounds. No hepatomegaly. No rebound/guarding. No obvious abdominal masses. Msk:  Strength and tone appear normal for age. Extremities: No clubbing or cyanosis. No  edema.  Distal pedal pulses are 2+ and equal bilaterally. Skin: Warm and Dry Neuro: Alert and oriented X 3. CN III-XII intact Grossly normal sensory and motor function . Psych:  Responds to questions appropriately with a normal affect.      Labs: Cardiac Enzymes No results found for this basename: CKTOTAL, CKMB, TROPONINI,  in the last 72 hours CBC Lab Results  Component Value Date   WBC 8.7 12/13/2011   HGB 14.9 12/13/2011   HCT 43.7 12/13/2011   MCV 84.7 12/13/2011   PLT 322.0 12/13/2011   PROTIME: No results found for this basename: LABPROT, INR,  in the last 72 hours Chemistry No results found for this  basename: NA, K, CL, CO2, BUN, CREATININE, CALCIUM, LABALBU, PROT, BILITOT, ALKPHOS, ALT, AST, GLUCOSE,  in the last 168 hours Lipids Lab Results  Component Value Date   CHOL 209* 12/13/2011   HDL 45.10 12/13/2011   LDLCALC 73 02/11/2008   TRIG 195.0* 12/13/2011   BNP No results found for this basename: probnp   Miscellaneous Lab Results  Component Value Date   DDIMER  Value: <0.22        AT THE INHOUSE ESTABLISHED CUTOFF VALUE OF 0.48 ug/mL FEU, THIS ASSAY HAS BEEN DOCUMENTED IN THE LITERATURE TO HAVE 06/12/2007    Radiology/Studies:  Mr Card Morphology Wo/w Cm  03/13/2012  *RADIOLOGY REPORT*  Clinical Data: Hypertrophic cardiomyopathy  MR CARDIA MORPHOLOGY WITHOUT AND WITH CONTRAST  GE 1.5 T magnet with dedicated cardiac coil.  FIESTA sequences for function and morphology.  10 minutes after 28 cc Multihance contrast was injected, inversion recovery sequences to assess for myocardial delayed enhancement were done.  Contrast: 28mL MULTIHANCE GADOBENATE DIMEGLUMINE 529 MG/ML IV SOLN  Comparison: None.  Findings: Normal left ventricular size with moderate asymmetric hypertrophy.  The basal  septum was thickened to 16 mm, the inferolateral wall measured 11 mm.  Normal wall motion and hypercontractile LV with EF 71%.  There did appear to be some turbulence across the LV outflow tract suggestive of a gradient. There was no systolic anterior motion of the mitral valve. There was no more than mild mitral regurgitation.  No aortic stenosis, trace aortic insufficiency.  Normal right atrial size.  Mild left atrial enlargement.  Normal right ventricular size and systolic function.  On delayed enhancement imaging, no myocardial enhancement was noted.  Measurements:  LV EDV 123 mL  LV SV 88 mL  LV EF 71%  IMPRESSION: 1. Normal LV with with moderate asymmetric hypertrophy of primarily the basal septum (16 mm in diastole).  Hypercontractile LV with EF 71%.  2. There was turbulence across the LV outflow tract suggestive of some degree of LVOT gradient.  3. No mitral valve SAM, no more than mild MR.  4. No myocardial delayed enhancement.  5. This picture could be consistent with hypertrophic cardiomyopathy.  By MRI, high risk findings are absent.   Original Report Authenticated By: Marca Ancona     EKG:    Assessment and Plan: *   Sherryl Manges

## 2012-03-31 ENCOUNTER — Other Ambulatory Visit: Payer: Self-pay | Admitting: Internal Medicine

## 2012-04-01 NOTE — Telephone Encounter (Signed)
Faxed hardcopy to pharmacy. 

## 2012-04-01 NOTE — Telephone Encounter (Signed)
Done hardcopy to robin  

## 2012-04-03 ENCOUNTER — Encounter (INDEPENDENT_AMBULATORY_CARE_PROVIDER_SITE_OTHER): Payer: BC Managed Care – PPO

## 2012-04-03 ENCOUNTER — Telehealth: Payer: Self-pay | Admitting: *Deleted

## 2012-04-03 DIAGNOSIS — I421 Obstructive hypertrophic cardiomyopathy: Secondary | ICD-10-CM

## 2012-04-03 DIAGNOSIS — I422 Other hypertrophic cardiomyopathy: Secondary | ICD-10-CM

## 2012-04-03 NOTE — Telephone Encounter (Signed)
48 hr holter moniter placed on Pt 04/03/12 TK

## 2012-04-05 ENCOUNTER — Ambulatory Visit (INDEPENDENT_AMBULATORY_CARE_PROVIDER_SITE_OTHER): Payer: BC Managed Care – PPO | Admitting: Internal Medicine

## 2012-04-05 ENCOUNTER — Encounter: Payer: Self-pay | Admitting: Internal Medicine

## 2012-04-05 VITALS — BP 112/80 | HR 80 | Temp 97.0°F

## 2012-04-05 DIAGNOSIS — J329 Chronic sinusitis, unspecified: Secondary | ICD-10-CM

## 2012-04-05 DIAGNOSIS — E785 Hyperlipidemia, unspecified: Secondary | ICD-10-CM | POA: Diagnosis not present

## 2012-04-05 DIAGNOSIS — R5383 Other fatigue: Secondary | ICD-10-CM | POA: Insufficient documentation

## 2012-04-05 DIAGNOSIS — R5381 Other malaise: Secondary | ICD-10-CM

## 2012-04-05 DIAGNOSIS — F988 Other specified behavioral and emotional disorders with onset usually occurring in childhood and adolescence: Secondary | ICD-10-CM

## 2012-04-05 DIAGNOSIS — J309 Allergic rhinitis, unspecified: Secondary | ICD-10-CM | POA: Diagnosis not present

## 2012-04-05 DIAGNOSIS — I10 Essential (primary) hypertension: Secondary | ICD-10-CM

## 2012-04-05 MED ORDER — PROPRANOLOL HCL ER 60 MG PO CP24: 60.0000 mg | ORAL_CAPSULE | Freq: Every day | ORAL | Status: DC

## 2012-04-05 MED ORDER — CEFUROXIME AXETIL 250 MG PO TABS: 250.0000 mg | ORAL_TABLET | Freq: Two times a day (BID) | ORAL | Status: DC

## 2012-04-05 MED ORDER — VERAPAMIL HCL ER 120 MG PO TBCR: 120.0000 mg | EXTENDED_RELEASE_TABLET | Freq: Every day | ORAL | Status: DC

## 2012-04-05 MED ORDER — ATORVASTATIN CALCIUM 10 MG PO TABS: 10.0000 mg | ORAL_TABLET | Freq: Every day | ORAL | Status: DC

## 2012-04-05 MED ORDER — FEXOFENADINE HCL 180 MG PO TABS: 180.0000 mg | ORAL_TABLET | Freq: Every day | ORAL | Status: DC | PRN

## 2012-04-05 NOTE — Assessment & Plan Note (Signed)
Ok for [employer] refill,  to f/u any worsening symptoms or concerns

## 2012-04-05 NOTE — Progress Notes (Signed)
Subjective:    Patient ID: [name], female    DOB: Apr 09, 1955, 57 y.o.   MRN: [id number]  HPI  Here to f/u;  Here with 2-3 days acute onset fever, facial pain, pressure, headache, general weakness and malaise, and greenish d/c, with mild ST and cough, but pt denies chest pain, wheezing, increased sob or doe, orthopnea, PND, increased LE swelling, palpitations, dizziness or syncope. Has been seeing Dr Graciela Husbands, currently with monitor, may need AICD.  Trying to follow lower chol diet.  Does have several wks ongoing nasal allergy symptoms with clearish congestion, itch and sneezing, without fever, pain, ST, cough, swelling or wheezing. Trying to avoid decongestants due to heart, also now off ADD med as well.  Out of allegra for several wks.  Now taking Inderal LA 60 with onset fatigue seems associated.   Past Medical History  Diagnosis Date  . Chronic sinus infection 04/12/2010  . Abdominal pain, epigastric 10/25/2007  . Abdominal pain, right lower quadrant 11/20/2006  . Abdominal pain, right upper quadrant 10/23/2006  . ADD 01/20/2009  . ADIPOSITY, LOCALIZED 10/15/2006  . ALLERGIC RHINITIS 10/15/2006  . ANGIOEDEMA 08/12/2008  . ANIMAL BITE 10/07/2009  . ANXIETY DEPRESSION 04/04/2007  . ANXIETY 10/15/2006  . ASTHMATIC BRONCHITIS, ACUTE 01/27/2008  . Cellulitis and abscess of leg, except foot 08/12/2007  . CELLULITIS, FACE 12/21/2008  . CHEST PAIN 06/14/2007  . COLECTOMY, PARTIAL, WITH ANASTOMOSIS, HX OF 10/15/2006  . COLONIC POLYPS, HX OF 10/15/2006  . Contusion of unspecified site 04/21/2008  . CYST, OVARIAN NEC/NOS 10/15/2006  . Dengue 02/22/2010  . DEPRESSION 10/15/2006  . DVT, HX OF 10/15/2006  . DYSAUTONOMIA 10/15/2006  . Dysfunction of eustachian tube 05/26/2009  . EAR PAIN, BILATERAL 08/16/2007  . GI BLEEDING 04/04/2007  . Glossitis 03/06/2008  . HEADACHE, CHRONIC 04/04/2007  . Hematemesis 10/25/2007  . HEMORRHOIDS 04/04/2007  . HYPERLIPIDEMIA 12/12/2007  . HYPERTENSION 10/15/2006  .  HYPOTHYROIDISM 03/06/2008  . IBS 04/04/2007  . MASTECTOMY, BILATERAL, HX OF 08/12/2009  . NECK MASS 07/01/2007  . NEOP, MALIGNANT, FEMALE BREAST NOS 10/15/2006  . 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 OTITIS MEDIA, ACUTE, BILATERAL 12/12/2007  . RASH-NONVESICULAR 02/13/2008  . RECTAL BLEEDING 04/04/2007  . SHY-DRAGER SYNDROME 01/17/2007  . SINUSITIS- ACUTE-NOS 10/15/2006  . SYNCOPE 07/01/2007  . URI 07/29/2007  . VERTIGO 01/17/2007  . VIRAL INFECTION 02/17/2010  . VITAMIN D DEFICIENCY 03/06/2008  . Wheezing 08/20/2008  . WOUND, OPEN, LEG, WITHOUT COMPLICATION 08/16/2007   Past Surgical History  Procedure Laterality Date  . Abdominal hysterectomy    . Oophorectomy    . Cholecystectomy    . Spigellian hernia    . Multiple ablations    . Bilateral mastectomy/reconstruction    . Cesarean section      reports that she has never smoked. She does not have any smokeless tobacco history on file. She reports that she does not drink alcohol or use illicit drugs. family history includes Cancer in her sister. Allergies  Allergen Reactions  . Latex     More than a day causes blisters  . Penicillins     REACTION: shock   Current Outpatient Prescriptions on File Prior to Visit  Medication Sig Dispense Refill  . acetaminophen-codeine (TYLENOL #3) 300-30 MG per tablet TAKE 1 TABLET BY MOUTH 4 TIMES A DAY AS NEEDED FOR PAIN  120 tablet  2  . celecoxib (CELEBREX) 200 MG capsule Take 200 mg by mouth as needed.       . fluticasone (  FLONASE) 50 MCG/ACT nasal spray Place 2 sprays into the nose daily. During Allergy Season      . levothyroxine (SYNTHROID, LEVOTHROID) 50 MCG tablet TAKE 1 TABLET EVERY DAY  90 tablet  3  . venlafaxine XR (EFFEXOR-XR) 150 MG 24 hr capsule Take 1 capsule (150 mg total) by mouth daily.  30 capsule  11  . zolpidem (AMBIEN) 10 MG tablet Take 1 tablet (10 mg total) by mouth at bedtime as needed.  30 tablet  5   No current facility-administered medications on file prior to visit.    Review of Systems  Constitutional: Negative for unexpected weight change, or unusual diaphoresis  HENT: Negative for tinnitus.   Eyes: Negative for photophobia and visual disturbance.  Respiratory: Negative for choking and stridor.   Gastrointestinal: Negative for vomiting and blood in stool.  Genitourinary: Negative for hematuria and decreased urine volume.  Musculoskeletal: Negative for acute joint swelling Skin: Negative for color change and wound.  Neurological: Negative for tremors and numbness other than noted  Psychiatric/Behavioral: Negative for decreased concentration or  hyperactivity.       Objective:   Physical Exam BP 112/80  Pulse 80  Temp(Src) 97 F (36.1 C) (Oral)  SpO2 97% VS noted,  Constitutional: Pt appears well-developed and well-nourished.  HENT: Head: NCAT.  Right Ear: External ear normal.  Left Ear: External ear normal.  Eyes: Conjunctivae and EOM are normal. Pupils are equal, round, and reactive to light.  Neck: Normal range of motion. Neck supple.  Bilat tm's with mild erythema.  Max sinus areas mild tender.  Pharynx with mild erythema, no exudate Cardiovascular: Normal rate and regular rhythm. Murmur noted as before  Pulmonary/Chest: Effort normal and breath sounds normal.  Neurological: Pt is alert. Not confused  Skin: Skin is warm. No erythema.  Psychiatric: Pt behavior is normal. Thought content normal.     Assessment & Plan:

## 2012-04-05 NOTE — Assessment & Plan Note (Signed)
Mild persistent since starting the inderal, asked pt to continue as this can improve in 4-8 wks

## 2012-04-05 NOTE — Patient Instructions (Addendum)
Please take all new medication as prescribed - the antibiotic, and the lipitor 10 mg per day Please continue all other medications as before, and refills have been done if requested - the allegra;  Hopefully the fatigue will improve in the next 1-2 months Please have the pharmacy call with any other refills you may need. Please keep your appointments with your specialists as you have planned  - Dr Graciela Husbands Thank you for enrolling in MyChart. Please follow the instructions below to securely access your online medical record. MyChart allows you to send messages to your doctor, view your test results, renew your prescriptions, schedule appointments, and more.

## 2012-04-05 NOTE — Assessment & Plan Note (Signed)
Now off amphetamine due to heart disease,  to f/u any worsening symptoms or concerns

## 2012-04-05 NOTE — Assessment & Plan Note (Signed)
stable overall by history and exam, recent data reviewed with pt, and pt to continue medical treatment as before,  to f/u any worsening symptoms or concerns BP Readings from Last 3 Encounters:  04/05/12 112/80  03/29/12 110/82  03/12/12 130/74

## 2012-04-05 NOTE — Assessment & Plan Note (Signed)
With flare - Mild to mod, for antibx course,  to f/u any worsening symptoms or concerns 

## 2012-04-05 NOTE — Assessment & Plan Note (Signed)
To start low dose lipitor 10 qd,  to f/u any worsening symptoms or concerns

## 2012-04-09 ENCOUNTER — Ambulatory Visit: Payer: BC Managed Care – PPO | Admitting: Cardiovascular Disease

## 2012-04-16 ENCOUNTER — Telehealth: Payer: Self-pay | Admitting: *Deleted

## 2012-04-16 NOTE — Telephone Encounter (Signed)
msg left stating normal event monitor(NSR) , asked her to call with further questions or concerns.

## 2012-04-26 ENCOUNTER — Encounter: Payer: BC Managed Care – PPO | Admitting: Nurse Practitioner

## 2012-04-29 ENCOUNTER — Other Ambulatory Visit: Payer: Self-pay | Admitting: *Deleted

## 2012-04-29 MED ORDER — PROPRANOLOL HCL ER 60 MG PO CP24: 60.0000 mg | ORAL_CAPSULE | Freq: Every day | ORAL | Status: DC

## 2012-04-29 NOTE — Telephone Encounter (Signed)
Fax Received. Refill Completed. [name] (R.M.A)   

## 2012-05-02 DIAGNOSIS — H6500 Acute serous otitis media, unspecified ear: Secondary | ICD-10-CM | POA: Diagnosis not present

## 2012-05-02 DIAGNOSIS — J00 Acute nasopharyngitis [common cold]: Secondary | ICD-10-CM | POA: Diagnosis not present

## 2012-05-10 ENCOUNTER — Encounter: Payer: BC Managed Care – PPO | Admitting: Nurse Practitioner

## 2012-05-21 ENCOUNTER — Ambulatory Visit (INDEPENDENT_AMBULATORY_CARE_PROVIDER_SITE_OTHER): Payer: BC Managed Care – PPO | Admitting: Internal Medicine

## 2012-05-21 ENCOUNTER — Encounter: Payer: Self-pay | Admitting: Internal Medicine

## 2012-05-21 VITALS — BP 120/82 | HR 83 | Temp 99.5°F

## 2012-05-21 DIAGNOSIS — I10 Essential (primary) hypertension: Secondary | ICD-10-CM | POA: Diagnosis not present

## 2012-05-21 DIAGNOSIS — J069 Acute upper respiratory infection, unspecified: Secondary | ICD-10-CM

## 2012-05-21 DIAGNOSIS — J309 Allergic rhinitis, unspecified: Secondary | ICD-10-CM

## 2012-05-21 MED ORDER — HYDROCODONE-HOMATROPINE 5-1.5 MG/5ML PO SYRP: 5.0000 mL | ORAL_SOLUTION | Freq: Four times a day (QID) | ORAL | Status: DC | PRN

## 2012-05-21 MED ORDER — METHYLPREDNISOLONE ACETATE 80 MG/ML IJ SUSP
80.0000 mg | Freq: Once | INTRAMUSCULAR | Status: AC
  Administered 2012-05-21: 80 mg via INTRAMUSCULAR

## 2012-05-21 MED ORDER — CEFUROXIME AXETIL 250 MG PO TABS: 250.0000 mg | ORAL_TABLET | Freq: Two times a day (BID) | ORAL | Status: DC

## 2012-05-21 NOTE — Assessment & Plan Note (Signed)
Mild to mod, for depomedrol IM,  to f/u any worsening symptoms or concerns 

## 2012-05-21 NOTE — Progress Notes (Signed)
Subjective:    Patient ID: [name], female    DOB: 13-Mar-1955, 57 y.o.   MRN: [id number]  HPI   Here with 2-3 days acute onset fever, facial pain, pressure, headache, general weakness and malaise, and greenish d/c, with mild ST and cough and bilat ear pain, but pt denies chest pain, wheezing, increased sob or doe, orthopnea, PND, increased LE swelling, palpitations, dizziness or syncope. Just flew back from [place].  Husband starting to cough as well.  Pt denies new neurological symptoms such as new headache, or facial or extremity weakness or numbness   Pt denies polydipsia, polyuria.  Has stress test soon.  Has ongoing allergy symptoms for the past 2 wks as well Past Medical History  Diagnosis Date  . Chronic sinus infection 04/12/2010  . Abdominal pain, epigastric 10/25/2007  . Abdominal pain, right lower quadrant 11/20/2006  . Abdominal pain, right upper quadrant 10/23/2006  . ADD 01/20/2009  . ADIPOSITY, LOCALIZED 10/15/2006  . ALLERGIC RHINITIS 10/15/2006  . ANGIOEDEMA 08/12/2008  . ANIMAL BITE 10/07/2009  . ANXIETY DEPRESSION 04/04/2007  . ANXIETY 10/15/2006  . ASTHMATIC BRONCHITIS, ACUTE 01/27/2008  . Cellulitis and abscess of leg, except foot 08/12/2007  . CELLULITIS, FACE 12/21/2008  . CHEST PAIN 06/14/2007  . COLECTOMY, PARTIAL, WITH ANASTOMOSIS, HX OF 10/15/2006  . COLONIC POLYPS, HX OF 10/15/2006  . Contusion of unspecified site 04/21/2008  . CYST, OVARIAN NEC/NOS 10/15/2006  . Dengue 02/22/2010  . DEPRESSION 10/15/2006  . DVT, HX OF 10/15/2006  . DYSAUTONOMIA 10/15/2006  . Dysfunction of eustachian tube 05/26/2009  . EAR PAIN, BILATERAL 08/16/2007  . GI BLEEDING 04/04/2007  . Glossitis 03/06/2008  . HEADACHE, CHRONIC 04/04/2007  . Hematemesis 10/25/2007  . HEMORRHOIDS 04/04/2007  . HYPERLIPIDEMIA 12/12/2007  . HYPERTENSION 10/15/2006  . HYPOTHYROIDISM 03/06/2008  . IBS 04/04/2007  . MASTECTOMY, BILATERAL, HX OF 08/12/2009  . NECK MASS 07/01/2007  . NEOP, MALIGNANT, FEMALE BREAST  NOS 10/15/2006  . 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 OTITIS MEDIA, ACUTE, BILATERAL 12/12/2007  . RASH-NONVESICULAR 02/13/2008  . RECTAL BLEEDING 04/04/2007  . SHY-DRAGER SYNDROME 01/17/2007  . SINUSITIS- ACUTE-NOS 10/15/2006  . SYNCOPE 07/01/2007  . URI 07/29/2007  . VERTIGO 01/17/2007  . VIRAL INFECTION 02/17/2010  . VITAMIN D DEFICIENCY 03/06/2008  . Wheezing 08/20/2008  . WOUND, OPEN, LEG, WITHOUT COMPLICATION 08/16/2007   Past Surgical History  Procedure Laterality Date  . Abdominal hysterectomy    . Oophorectomy    . Cholecystectomy    . Spigellian hernia    . Multiple ablations    . Bilateral mastectomy/reconstruction    . Cesarean section      reports that she has never smoked. She does not have any smokeless tobacco history on file. She reports that she does not drink alcohol or use illicit drugs. family history includes Cancer in her sister. Allergies  Allergen Reactions  . Latex     More than a day causes blisters  . Penicillins     REACTION: shock   Current Outpatient Prescriptions on File Prior to Visit  Medication Sig Dispense Refill  . acetaminophen-codeine (TYLENOL #3) 300-30 MG per tablet TAKE 1 TABLET BY MOUTH 4 TIMES A DAY AS NEEDED FOR PAIN  120 tablet  2  . atorvastatin (LIPITOR) 10 MG tablet Take 1 tablet (10 mg total) by mouth daily.  90 tablet  3  . celecoxib (CELEBREX) 200 MG capsule Take 200 mg by mouth as needed.       . fexofenadine (ALLEGRA)  180 MG tablet Take 1 tablet (180 mg total) by mouth daily as needed.  90 tablet  3  . fluticasone (FLONASE) 50 MCG/ACT nasal spray Place 2 sprays into the nose daily. During Allergy Season      . levothyroxine (SYNTHROID, LEVOTHROID) 50 MCG tablet TAKE 1 TABLET EVERY DAY  90 tablet  3  . propranolol ER (INDERAL LA) 60 MG 24 hr capsule Take 1 capsule (60 mg total) by mouth daily.  30 capsule  5  . venlafaxine XR (EFFEXOR-XR) 150 MG 24 hr capsule Take 1 capsule (150 mg total) by mouth daily.  30 capsule  11  .  zolpidem (AMBIEN) 10 MG tablet Take 1 tablet (10 mg total) by mouth at bedtime as needed.  30 tablet  5   No current facility-administered medications on file prior to visit.   Review of Systems  Constitutional: Negative for unexpected weight change, or unusual diaphoresis  HENT: Negative for tinnitus.   Eyes: Negative for photophobia and visual disturbance.  Respiratory: Negative for choking and stridor.   Gastrointestinal: Negative for vomiting and blood in stool.  Genitourinary: Negative for hematuria and decreased urine volume.  Musculoskeletal: Negative for acute joint swelling Skin: Negative for color change and wound.  Neurological: Negative for tremors and numbness other than noted  Psychiatric/Behavioral: Negative for decreased concentration or  hyperactivity.       Objective:   Physical Exam BP 120/82  Pulse 83  Temp(Src) 99.5 F (37.5 C) (Oral) VS noted, mild ill Constitutional: Pt appears well-developed and well-nourished.  HENT: Head: NCAT.  Right Ear: External ear normal.  Left Ear: External ear normal.  Eyes: Conjunctivae and EOM are normal. Pupils are equal, round, and reactive to light.  Neck: Normal range of motion. Neck supple.  Bilat tm's with mild erythema.  Max sinus areas mild tender.  Pharynx with mild erythema, no exudate Cardiovascular: Normal rate and regular rhythm.   Pulmonary/Chest: Effort normal and breath sounds normal.  Neurological: Pt is alert. Not confused  Skin: Skin is warm. No erythema.  Psychiatric: Pt behavior is normal. Thought content normal. mild nervous    Assessment & Plan:

## 2012-05-21 NOTE — Patient Instructions (Addendum)
You had the steroid shot today Please take all new medication as prescribed - the antibiotic, and cough medicine You can also take Mucinex (or it's generic off brand) for congestion, and tylenol as needed for pain. [name] with your Stress Test

## 2012-05-21 NOTE — Assessment & Plan Note (Signed)
Mild to mod, for antibx course,  to f/u any worsening symptoms or concerns 

## 2012-05-21 NOTE — Assessment & Plan Note (Signed)
stable overall by history and exam, recent data reviewed with pt, and pt to continue medical treatment as before,  to f/u any worsening symptoms or concerns BP Readings from Last 3 Encounters:  05/21/12 120/82  04/05/12 112/80  03/29/12 110/82

## 2012-05-24 ENCOUNTER — Encounter: Payer: BC Managed Care – PPO | Admitting: Physician Assistant

## 2012-06-03 ENCOUNTER — Encounter: Payer: Self-pay | Admitting: Nurse Practitioner

## 2012-06-03 ENCOUNTER — Ambulatory Visit (INDEPENDENT_AMBULATORY_CARE_PROVIDER_SITE_OTHER): Payer: BC Managed Care – PPO | Admitting: Nurse Practitioner

## 2012-06-03 VITALS — BP 141/98 | HR 95

## 2012-06-03 DIAGNOSIS — I422 Other hypertrophic cardiomyopathy: Secondary | ICD-10-CM

## 2012-06-03 NOTE — Progress Notes (Signed)
Exercise Treadmill Test  Pre-Exercise Testing Evaluation Rhythm: sinus tachycardia  Rate: 95                 Test  Exercise Tolerance Test Ordering MD: Sherryl Manges, MD  Interpreting MD: Norma Fredrickson, NP  Unique Test No: 1   Treadmill:  1  Indication for ETT: HOCM  Contraindication to ETT: No   Stress Modality: exercise - treadmill  Cardiac Imaging Performed: non   Protocol: standard Bruce - maximal  Max BP:  190/95  Max MPHR (bpm):  163 85% MPR (bpm):  139  MPHR obtained (bpm):  144 % MPHR obtained:  88%  Reached 85% MPHR (min:sec):  3:55 Total Exercise Time (min-sec):  5 minutes  Workload in METS:  7 Borg Scale: 13  Reason ETT Terminated:  patient's desire to stop    ST Segment Analysis At Rest: normal ST segments - no evidence of significant ST depression With Exercise: no evidence of significant ST depression  Other Information Arrhythmia:  No Angina during ETT:  absent (0) Quality of ETT:  diagnostic  ETT Interpretation:  normal - no evidence of ischemia by ST analysis  Comments: Patient presents today for routine GXT. Has HOCM. Wall thickness has increased to 19 mm from 12 mm in 2008. Now on Inderal with good tolerability. Some mild DOE/chest pressure if over exerts. Anxious to begin an exercise program.   Today, she exercised on the standard Bruce protocol for a total of 5 minutes. She has poor exercise tolerance. BP rose with exercise. Maximum BP was 190/95. Target HR achieved at 3:55. Test was stopped due to shortness of breath. No chest pain. No arrhythmia noted. EKG negative for ischemia.   Recommendations: Will review with Dr. Graciela Husbands. Patient is wanting to travel extensively out of the country this year and wanting guidance as well as disposition for her condition.  Will discuss with Dr. Graciela Husbands in the am.  No change in medicines for now.   Patient is agreeable to this plan and will call if any problems develop in the interim.   Rosalio Macadamia, RN,  ANP-C [facility] [address] [place], [place]  [address]

## 2012-06-06 ENCOUNTER — Ambulatory Visit (INDEPENDENT_AMBULATORY_CARE_PROVIDER_SITE_OTHER): Payer: BC Managed Care – PPO | Admitting: Internal Medicine

## 2012-06-06 VITALS — BP 130/80 | HR 73 | Ht 69.0 in | Wt 203.0 lb

## 2012-06-06 DIAGNOSIS — I421 Obstructive hypertrophic cardiomyopathy: Secondary | ICD-10-CM

## 2012-06-06 DIAGNOSIS — R0600 Dyspnea, unspecified: Secondary | ICD-10-CM

## 2012-06-06 DIAGNOSIS — R0609 Other forms of dyspnea: Secondary | ICD-10-CM | POA: Diagnosis not present

## 2012-06-06 DIAGNOSIS — R55 Syncope and collapse: Secondary | ICD-10-CM

## 2012-06-06 DIAGNOSIS — R0989 Other specified symptoms and signs involving the circulatory and respiratory systems: Secondary | ICD-10-CM

## 2012-06-06 MED ORDER — PROPRANOLOL HCL ER 120 MG PO CP24: ORAL_CAPSULE | ORAL | Status: DC

## 2012-06-06 NOTE — Assessment & Plan Note (Signed)
No recurrent syncope 

## 2012-06-06 NOTE — Assessment & Plan Note (Signed)
Issues related to HCM were again reviewed. A recent family tree was constructed. There is a history of maternal grandmother and great grandmother dying suddenly their 80s. Echocardiogram of her mother demonstrates normal dimensions 2007 at the age of 21.  We discussed the importance of heart rate control given diastolic dysfunction inherent in HTN. Will increase her Inderal from 60--120 mg a day. She will let us know how her heart rate is doing with exercise.  Will undertake gene testing today. I've also discussed with her the potential value of having Dr. Modesto Charon weigh in on her condition especially if there is no unclarifying echoes. We'll also plan to do echoes in her son. One son is in town this weekend and may be available for echo on Friday afternoon if we have a slot available

## 2012-06-06 NOTE — Patient Instructions (Signed)
Your physician has recommended you make the following change in your medication:  1) Increase inderal to 120 mg once daily.  Your physician recommends that you have lab work: today- Gene DX  Your physician recommends that you schedule a follow-up appointment in: 4 months with Dr. Graciela Husbands.  We will need to schedule echocardiograms to be done on your children per Dr. Graciela Husbands.

## 2012-06-06 NOTE — Progress Notes (Signed)
Patient Care Team: Corwin Levins, MD as PCP - General   HPI  [name] is a 57 y.o. female Seen in followup for recently identified  HCM . Intercurrent risk stratification includes a 48 hour Holter demonstrating no ventricular tachycardia and blood pressure response to exercise was excessive-normal  MRI findings showed 16 mm in the basal septum; there is no gadolinium enhancement. 11 mm inferolateral wall.  She has significant increase in heart rate at 1 minute on a standard Bruce protocol with heart rates in the 130s. She has significant dyspnea on exertion  At the last visit we undertook a beta blocker trial. She has tolerated Inderal as best.  Her son had been told that he had a diagnosis of HCM; this diagnosis was made in [place] and has intercurrently been recanted.  She has a antecedent history of atrial arrhythmias and closed surgical autonomic dysfunction that persisted for many years and a number of years had finally as needed.  Past Medical History  Diagnosis Date  . Chronic sinus infection 04/12/2010  . Abdominal pain, epigastric 10/25/2007  . Abdominal pain, right lower quadrant 11/20/2006  . Abdominal pain, right upper quadrant 10/23/2006  . ADD 01/20/2009  . ADIPOSITY, LOCALIZED 10/15/2006  . ALLERGIC RHINITIS 10/15/2006  . ANGIOEDEMA 08/12/2008  . ANIMAL BITE 10/07/2009  . ANXIETY DEPRESSION 04/04/2007  . ANXIETY 10/15/2006  . ASTHMATIC BRONCHITIS, ACUTE 01/27/2008  . Cellulitis and abscess of leg, except foot 08/12/2007  . CELLULITIS, FACE 12/21/2008  . CHEST PAIN 06/14/2007  . COLECTOMY, PARTIAL, WITH ANASTOMOSIS, HX OF 10/15/2006  . COLONIC POLYPS, HX OF 10/15/2006  . Contusion of unspecified site 04/21/2008  . CYST, OVARIAN NEC/NOS 10/15/2006  . Dengue 02/22/2010  . DEPRESSION 10/15/2006  . DVT, HX OF 10/15/2006  . DYSAUTONOMIA 10/15/2006  . Dysfunction of eustachian tube 05/26/2009  . EAR PAIN, BILATERAL 08/16/2007  . GI BLEEDING 04/04/2007  . Glossitis 03/06/2008   . HEADACHE, CHRONIC 04/04/2007  . Hematemesis 10/25/2007  . HEMORRHOIDS 04/04/2007  . HYPERLIPIDEMIA 12/12/2007  . HYPERTENSION 10/15/2006  . HYPOTHYROIDISM 03/06/2008  . IBS 04/04/2007  . MASTECTOMY, BILATERAL, HX OF 08/12/2009  . NECK MASS 07/01/2007  . NEOP, MALIGNANT, FEMALE BREAST NOS 10/15/2006  . 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 OTITIS MEDIA, ACUTE, BILATERAL 12/12/2007  . RASH-NONVESICULAR 02/13/2008  . RECTAL BLEEDING 04/04/2007  . SHY-DRAGER SYNDROME 01/17/2007  . SINUSITIS- ACUTE-NOS 10/15/2006  . SYNCOPE 07/01/2007  . URI 07/29/2007  . VERTIGO 01/17/2007  . VIRAL INFECTION 02/17/2010  . VITAMIN D DEFICIENCY 03/06/2008  . Wheezing 08/20/2008  . WOUND, OPEN, LEG, WITHOUT COMPLICATION 08/16/2007    Past Surgical History  Procedure Laterality Date  . Abdominal hysterectomy    . Oophorectomy    . Cholecystectomy    . Spigellian hernia    . Multiple ablations    . Bilateral mastectomy/reconstruction    . Cesarean section      Current Outpatient Prescriptions  Medication Sig Dispense Refill  . acetaminophen-codeine (TYLENOL #3) 300-30 MG per tablet TAKE 1 TABLET BY MOUTH 4 TIMES A DAY AS NEEDED FOR PAIN  120 tablet  2  . celecoxib (CELEBREX) 200 MG capsule Take 200 mg by mouth as needed.       . fexofenadine (ALLEGRA) 180 MG tablet Take 1 tablet (180 mg total) by mouth daily as needed.  90 tablet  3  . fluticasone (FLONASE) 50 MCG/ACT nasal spray Place 2 sprays into the nose daily. During Allergy Season      .  levothyroxine (SYNTHROID, LEVOTHROID) 50 MCG tablet TAKE 1 TABLET EVERY DAY  90 tablet  3  . propranolol ER (INDERAL LA) 60 MG 24 hr capsule Take 1 capsule (60 mg total) by mouth daily.  30 capsule  5  . venlafaxine XR (EFFEXOR-XR) 150 MG 24 hr capsule Take 1 capsule (150 mg total) by mouth daily.  30 capsule  11  . zolpidem (AMBIEN) 10 MG tablet Take 1 tablet (10 mg total) by mouth at bedtime as needed.  30 tablet  5   No current facility-administered medications for this  visit.    Allergies  Allergen Reactions  . Latex     More than a day causes blisters  . Penicillins     REACTION: shock    Review of Systems negative except from HPI and PMH  Physical Exam BP 130/80  Pulse 73  Ht 5\' 9"  (1.753 m)  Wt 203 lb (92.08 kg)  BMI 29.96 kg/m2 Well developed and well nourished in no acute distress HENT normal E scleral and icterus clear Neck Supple JVP flat; carotids brisk and full Clear to ausculation regular rate and rhythm, no murmurs gallops or rub Soft with active bowel sounds No clubbing cyanosis none Edema Alert and oriented, grossly normal motor and sensory function Skin Warm and Dry  ECG demonstrates sinus rhythm at 95 Intervals 14/08/36 with nonspecific ST-T changes  Assessment and  Plan

## 2012-06-06 NOTE — Assessment & Plan Note (Signed)
As above.

## 2012-06-26 ENCOUNTER — Encounter: Payer: Self-pay | Admitting: Internal Medicine

## 2012-06-28 ENCOUNTER — Other Ambulatory Visit: Payer: Self-pay | Admitting: Internal Medicine

## 2012-06-29 ENCOUNTER — Other Ambulatory Visit: Payer: Self-pay | Admitting: Internal Medicine

## 2012-07-01 ENCOUNTER — Other Ambulatory Visit: Payer: Self-pay | Admitting: Internal Medicine

## 2012-07-01 NOTE — Telephone Encounter (Signed)
Faxed hardcopy to [facility]  

## 2012-07-01 NOTE — Telephone Encounter (Signed)
Done hardcopy to robin  

## 2012-07-18 ENCOUNTER — Telehealth: Payer: Self-pay | Admitting: Internal Medicine

## 2012-07-18 ENCOUNTER — Telehealth: Payer: Self-pay | Admitting: *Deleted

## 2012-07-18 DIAGNOSIS — R072 Precordial pain: Secondary | ICD-10-CM | POA: Diagnosis not present

## 2012-07-18 DIAGNOSIS — R079 Chest pain, unspecified: Secondary | ICD-10-CM | POA: Diagnosis not present

## 2012-07-18 NOTE — Telephone Encounter (Signed)
New Prob  Pt states that she has been experiencing pain in her chest for the past 2 days. She said that her left jaw and her lips feel numb. She said she has taken advil & Tylenol and it has not helped.

## 2012-07-18 NOTE — Telephone Encounter (Signed)
Discussed above with Dr.Klein. He has advised that the patient report to her local emergency room to be evaluated for a possible PE. Called patient back. She did not answer the phone so I left her a voice mail with instruction to go to her local ER and not to delay per Dr.Klein.

## 2012-07-18 NOTE — Telephone Encounter (Signed)
Patient called back and stated that her an her husband thinks she needs to see Dr.Klein because he knows her history and her local ER is " not very good". Advised that any ER can screen a patient for a PE and she needs to proceed now instead of waiting seeing Dr.Klein. Patient verbalized understanding.

## 2012-07-18 NOTE — Telephone Encounter (Signed)
Patient called complaining of chest pain with expiration for 2 days. Denies SOB or palpitations. She starting having pain between her shoulder blades also. Tylenol and Advil not helping. Advised will discuss with Dr.Klein and call her back.

## 2012-08-12 ENCOUNTER — Encounter: Payer: Self-pay | Admitting: Internal Medicine

## 2012-08-12 ENCOUNTER — Ambulatory Visit (INDEPENDENT_AMBULATORY_CARE_PROVIDER_SITE_OTHER): Payer: BC Managed Care – PPO | Admitting: Internal Medicine

## 2012-08-12 VITALS — BP 130/82 | HR 77 | Temp 97.5°F

## 2012-08-12 DIAGNOSIS — R5383 Other fatigue: Secondary | ICD-10-CM

## 2012-08-12 DIAGNOSIS — F3289 Other specified depressive episodes: Secondary | ICD-10-CM | POA: Diagnosis not present

## 2012-08-12 DIAGNOSIS — F329 Major depressive disorder, single episode, unspecified: Secondary | ICD-10-CM | POA: Diagnosis not present

## 2012-08-12 DIAGNOSIS — J069 Acute upper respiratory infection, unspecified: Secondary | ICD-10-CM | POA: Diagnosis not present

## 2012-08-12 DIAGNOSIS — R5381 Other malaise: Secondary | ICD-10-CM

## 2012-08-12 MED ORDER — CEFUROXIME AXETIL 250 MG PO TABS: 250.0000 mg | ORAL_TABLET | Freq: Two times a day (BID) | ORAL | Status: DC

## 2012-08-12 NOTE — Progress Notes (Signed)
Subjective:    Patient ID: [name], female    DOB: Aug 27, 1955, 57 y.o.   MRN: [id number]  HPI   Here with 2-3 days acute onset fever, facial pain, pressure, headache, general weakness and malaise, and greenish d/c, with mild ST and cough, but pt denies chest pain, wheezing, increased sob or doe, orthopnea, PND, increased LE swelling, palpitations, dizziness or syncope.  Has not had significant infxn for over 3 months, Denies worsening depressive symptoms, suicidal ideation, or panic; has ongoing anxiety, not increased recently.  Now on higher dose inderal daily 120 mg,  Pt notes she accidentally took initially 120 mg bid until she realized her error, had significant fatigue, now improved.  Past Medical History  Diagnosis Date  . Chronic sinus infection 04/12/2010  . Abdominal pain, epigastric 10/25/2007  . Abdominal pain, right lower quadrant 11/20/2006  . Abdominal pain, right upper quadrant 10/23/2006  . ADD 01/20/2009  . ADIPOSITY, LOCALIZED 10/15/2006  . ALLERGIC RHINITIS 10/15/2006  . ANGIOEDEMA 08/12/2008  . ANIMAL BITE 10/07/2009  . ANXIETY DEPRESSION 04/04/2007  . ANXIETY 10/15/2006  . ASTHMATIC BRONCHITIS, ACUTE 01/27/2008  . Cellulitis and abscess of leg, except foot 08/12/2007  . CELLULITIS, FACE 12/21/2008  . CHEST PAIN 06/14/2007  . COLECTOMY, PARTIAL, WITH ANASTOMOSIS, HX OF 10/15/2006  . COLONIC POLYPS, HX OF 10/15/2006  . Contusion of unspecified site 04/21/2008  . CYST, OVARIAN NEC/NOS 10/15/2006  . Dengue 02/22/2010  . DEPRESSION 10/15/2006  . DVT, HX OF 10/15/2006  . DYSAUTONOMIA 10/15/2006  . Dysfunction of eustachian tube 05/26/2009  . EAR PAIN, BILATERAL 08/16/2007  . GI BLEEDING 04/04/2007  . Glossitis 03/06/2008  . HEADACHE, CHRONIC 04/04/2007  . Hematemesis 10/25/2007  . HEMORRHOIDS 04/04/2007  . HYPERLIPIDEMIA 12/12/2007  . HYPERTENSION 10/15/2006  . HYPOTHYROIDISM 03/06/2008  . IBS 04/04/2007  . MASTECTOMY, BILATERAL, HX OF 08/12/2009  . NECK MASS 07/01/2007  .  NEOP, MALIGNANT, FEMALE BREAST NOS 10/15/2006  . 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 OTITIS MEDIA, ACUTE, BILATERAL 12/12/2007  . RASH-NONVESICULAR 02/13/2008  . RECTAL BLEEDING 04/04/2007  . SHY-DRAGER SYNDROME 01/17/2007  . SINUSITIS- ACUTE-NOS 10/15/2006  . SYNCOPE 07/01/2007  . URI 07/29/2007  . VERTIGO 01/17/2007  . VIRAL INFECTION 02/17/2010  . VITAMIN D DEFICIENCY 03/06/2008  . Wheezing 08/20/2008  . WOUND, OPEN, LEG, WITHOUT COMPLICATION 08/16/2007   Past Surgical History  Procedure Laterality Date  . Abdominal hysterectomy    . Oophorectomy    . Cholecystectomy    . Spigellian hernia    . Multiple ablations    . Bilateral mastectomy/reconstruction    . Cesarean section      reports that she has never smoked. She does not have any smokeless tobacco history on file. She reports that she does not drink alcohol or use illicit drugs. family history includes Cancer in her sister. Allergies  Allergen Reactions  . Latex     More than a day causes blisters  . Penicillins     REACTION: shock   Current Outpatient Prescriptions on File Prior to Visit  Medication Sig Dispense Refill  . acetaminophen-codeine (TYLENOL #3) 300-30 MG per tablet TAKE 1 TABLET BY MOUTH 4 TIMES DAILY AS NEEDED FOR PAIN  120 tablet  2  . celecoxib (CELEBREX) 200 MG capsule Take 200 mg by mouth as needed.       . fexofenadine (ALLEGRA) 180 MG tablet Take 1 tablet (180 mg total) by mouth daily as needed.  90 tablet  3  . fluticasone (FLONASE) 50  MCG/ACT nasal spray Place 2 sprays into the nose daily. During Allergy Season      . levothyroxine (SYNTHROID, LEVOTHROID) 50 MCG tablet TAKE 1 TABLET EVERY DAY  90 tablet  3  . losartan (COZAAR) 100 MG tablet TAKE 1 TABLET EVERY DAY  30 tablet  11  . propranolol ER (INDERAL LA) 120 MG 24 hr capsule Take one capsule by mouth daily  30 capsule  6  . venlafaxine XR (EFFEXOR-XR) 150 MG 24 hr capsule Take 1 capsule (150 mg total) by mouth daily.  30 capsule  11  . zolpidem  (AMBIEN) 10 MG tablet Take 1 tablet (10 mg total) by mouth at bedtime as needed.  30 tablet  5   No current facility-administered medications on file prior to visit.   Review of Systems  Constitutional: Negative for unexpected weight change, or unusual diaphoresis  HENT: Negative for tinnitus.   Eyes: Negative for photophobia and visual disturbance.  Respiratory: Negative for choking and stridor.   Gastrointestinal: Negative for vomiting and blood in stool.  Genitourinary: Negative for hematuria and decreased urine volume.  Musculoskeletal: Negative for acute joint swelling Skin: Negative for color change and wound.  Neurological: Negative for tremors and numbness other than noted  Psychiatric/Behavioral: Negative for decreased concentration or  hyperactivity.       Objective:   Physical Exam BP 130/82  Pulse 77  Temp(Src) 97.5 F (36.4 C) (Oral)  SpO2 94% VS noted, mild ill Constitutional: Pt appears well-developed and well-nourished.  HENT: Head: NCAT.  Right Ear: External ear normal.  Left Ear: External ear normal.  Bilat tm's with mild erythema.  Max sinus areas mild tender.  Pharynx with mild erythema, no exudate Eyes: Conjunctivae and EOM are normal. Pupils are equal, round, and reactive to light.  Neck: Normal range of motion. Neck supple.  Cardiovascular: Normal rate and regular rhythm.   Pulmonary/Chest: Effort normal and breath sounds normal.  Neurological: Pt is alert. Not confused  Skin: Skin is warm. No erythema.  Psychiatric: Pt behavior is normal. Thought content normal.     Assessment & Plan:

## 2012-08-12 NOTE — Patient Instructions (Signed)
Please take all new medication as prescribed Please continue all other medications as before, and refills have been done if requested.  Please keep your appointments with your specialists as you have planned  Please remember to sign up for My Chart if you have not done so, as this will be important to you in the future with finding out test results, communicating by private email, and scheduling acute appointments online when needed.

## 2012-08-12 NOTE — Assessment & Plan Note (Signed)
Transient episode related to inadvertant double dose inderal 120 mg, now improved,  to f/u any worsening symptoms or concerns

## 2012-08-12 NOTE — Assessment & Plan Note (Signed)
stable overall by history and exam, recent data reviewed with pt, and pt to continue medical treatment as before,  to f/u any worsening symptoms or concerns Lab Results  Component Value Date   WBC 8.7 12/13/2011   HGB 14.9 12/13/2011   HCT 43.7 12/13/2011   PLT 322.0 12/13/2011   GLUCOSE 97 03/01/2012   CHOL 209* 12/13/2011   TRIG 195.0* 12/13/2011   HDL 45.10 12/13/2011   LDLDIRECT 137.4 12/13/2011   LDLCALC 73 02/11/2008   ALT 28 12/13/2011   AST 28 12/13/2011   NA 139 03/01/2012   K 4.0 03/01/2012   CL 105 03/01/2012   CREATININE 0.9 03/01/2012   BUN 13 03/01/2012   CO2 29 03/01/2012   TSH 2.77 12/13/2011   INR 1.0 12/03/2006   HGBA1C  Value: 5.2 (NOTE)   The ADA recommends the following therapeutic goal for glycemic   control related to Hgb A1C measurement:   Goal of Therapy:   < 7.0% Hgb A1C   Reference: American Diabetes Association: Clinical Practice   Recommendations 2008, Diabetes Care,  2008, 31:(Suppl 1). 08/12/2007

## 2012-08-12 NOTE — Assessment & Plan Note (Signed)
Mild to mod, for antibx course,  to f/u any worsening symptoms or concerns 

## 2012-09-27 ENCOUNTER — Other Ambulatory Visit: Payer: Self-pay | Admitting: Internal Medicine

## 2012-09-27 NOTE — Telephone Encounter (Signed)
Done hardcopy to robin  

## 2012-09-27 NOTE — Telephone Encounter (Signed)
Faxed hardcopy to [facility]

## 2012-09-29 ENCOUNTER — Encounter: Payer: Self-pay | Admitting: Internal Medicine

## 2012-10-02 ENCOUNTER — Encounter: Payer: Self-pay | Admitting: *Deleted

## 2012-10-02 ENCOUNTER — Ambulatory Visit (INDEPENDENT_AMBULATORY_CARE_PROVIDER_SITE_OTHER): Payer: BC Managed Care – PPO | Admitting: Internal Medicine

## 2012-10-02 ENCOUNTER — Encounter: Payer: Self-pay | Admitting: Internal Medicine

## 2012-10-02 VITALS — BP 132/84 | HR 74 | Ht 69.0 in | Wt 212.0 lb

## 2012-10-02 DIAGNOSIS — R0600 Dyspnea, unspecified: Secondary | ICD-10-CM

## 2012-10-02 DIAGNOSIS — I421 Obstructive hypertrophic cardiomyopathy: Secondary | ICD-10-CM | POA: Diagnosis not present

## 2012-10-02 DIAGNOSIS — R079 Chest pain, unspecified: Secondary | ICD-10-CM

## 2012-10-02 DIAGNOSIS — R0609 Other forms of dyspnea: Secondary | ICD-10-CM

## 2012-10-02 DIAGNOSIS — R0989 Other specified symptoms and signs involving the circulatory and respiratory systems: Secondary | ICD-10-CM

## 2012-10-02 DIAGNOSIS — R55 Syncope and collapse: Secondary | ICD-10-CM

## 2012-10-02 LAB — CBC WITH DIFFERENTIAL/PLATELET
Basophils Relative: 0.6 % (ref 0.0–3.0)
Eosinophils Relative: 5.2 % — ABNORMAL HIGH (ref 0.0–5.0)
Lymphocytes Relative: 30.8 % (ref 12.0–46.0)
Monocytes Absolute: 0.4 10*3/uL (ref 0.1–1.0)
Monocytes Relative: 6.1 % (ref 3.0–12.0)
Neutrophils Relative %: 57.3 % (ref 43.0–77.0)
Platelets: 298 10*3/uL (ref 150.0–400.0)
RBC: 4.36 Mil/uL (ref 3.87–5.11)
WBC: 6.2 10*3/uL (ref 4.5–10.5)

## 2012-10-02 MED ORDER — PROPRANOLOL HCL ER 120 MG PO CP24: ORAL_CAPSULE | ORAL | Status: DC

## 2012-10-02 NOTE — Assessment & Plan Note (Signed)
As above.

## 2012-10-02 NOTE — Assessment & Plan Note (Signed)
She is having significant exertional chest pain in the context of her newly diagnosed HCM  she also continues to have exercise associated tachycardia is in the differential diagnosis includes HCM and its relationship to tachycardia potentially related to her autonomic dysfunction as well as coronary artery disease.  Undertake a right left heart catheterization and   do some exercise at the same time to see what happens to her heart rate in filling pressures with exercise and presumably 8 increase in heart rate. We will plan to increase her propranolol from 120--240 mg a day.  We'll begin her on baby aspirin

## 2012-10-02 NOTE — Progress Notes (Signed)
Patient Care Team: Corwin Levins, MD as PCP - General   HPI  [name] is a 57 y.o. female  Seen  Because of worsening of chest pain in the context of   recently identified HCM .She also has hx of dysautonomia which recently became less problematic Also with hx of atrila arrhythmia  Shortness of breath and chest pain has been progressive over the summer. She is not limited to less than 100 feet. Is associated with radiation from her sternum into her neck and is accompanied by diaphoresis and shortness of breath Her sob seems to correlate with edema.  Cardiac risk factors include hyperlipidemia, hypertension family history   Intercurrent risk stratification includes a 48 hour Holter demonstrating no ventricular tachycardia and blood pressure response to exercise was excessive-normal  MRI findings showed 16 mm in the basal septum; there is no gadolinium enhancement. 11 mm inferolateral wall.       Past Medical History  Diagnosis Date  . Chronic sinus infection 04/12/2010  . Abdominal pain, epigastric 10/25/2007  . Abdominal pain, right lower quadrant 11/20/2006  . Abdominal pain, right upper quadrant 10/23/2006  . ADD 01/20/2009  . ADIPOSITY, LOCALIZED 10/15/2006  . ALLERGIC RHINITIS 10/15/2006  . ANGIOEDEMA 08/12/2008  . ANIMAL BITE 10/07/2009  . ANXIETY DEPRESSION 04/04/2007  . ANXIETY 10/15/2006  . ASTHMATIC BRONCHITIS, ACUTE 01/27/2008  . Cellulitis and abscess of leg, except foot 08/12/2007  . CELLULITIS, FACE 12/21/2008  . CHEST PAIN 06/14/2007  . COLECTOMY, PARTIAL, WITH ANASTOMOSIS, HX OF 10/15/2006  . COLONIC POLYPS, HX OF 10/15/2006  . Contusion of unspecified site 04/21/2008  . CYST, OVARIAN NEC/NOS 10/15/2006  . Dengue 02/22/2010  . DEPRESSION 10/15/2006  . DVT, HX OF 10/15/2006  . DYSAUTONOMIA 10/15/2006  . Dysfunction of eustachian tube 05/26/2009  . EAR PAIN, BILATERAL 08/16/2007  . GI BLEEDING 04/04/2007  . Glossitis 03/06/2008  . HEADACHE, CHRONIC 04/04/2007  .  Hematemesis 10/25/2007  . HEMORRHOIDS 04/04/2007  . HYPERLIPIDEMIA 12/12/2007  . HYPERTENSION 10/15/2006  . HYPOTHYROIDISM 03/06/2008  . IBS 04/04/2007  . MASTECTOMY, BILATERAL, HX OF 08/12/2009  . NECK MASS 07/01/2007  . NEOP, MALIGNANT, FEMALE BREAST NOS 10/15/2006  . 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 OTITIS MEDIA, ACUTE, BILATERAL 12/12/2007  . RASH-NONVESICULAR 02/13/2008  . RECTAL BLEEDING 04/04/2007  . SHY-DRAGER SYNDROME 01/17/2007  . SINUSITIS- ACUTE-NOS 10/15/2006  . SYNCOPE 07/01/2007  . URI 07/29/2007  . VERTIGO 01/17/2007  . VIRAL INFECTION 02/17/2010  . VITAMIN D DEFICIENCY 03/06/2008  . Wheezing 08/20/2008  . WOUND, OPEN, LEG, WITHOUT COMPLICATION 08/16/2007    Past Surgical History  Procedure Laterality Date  . Abdominal hysterectomy    . Oophorectomy    . Cholecystectomy    . Spigellian hernia    . Multiple ablations    . Bilateral mastectomy/reconstruction    . Cesarean section      Current Outpatient Prescriptions  Medication Sig Dispense Refill  . acetaminophen-codeine (TYLENOL #3) 300-30 MG per tablet TAKE 1 TABLET BY MOUTH FOUR TIMES DAILY AS NEEDED FOR PAIN  120 tablet  1  . cefUROXime (CEFTIN) 250 MG tablet Take 1 tablet (250 mg total) by mouth 2 (two) times daily.  20 tablet  1  . celecoxib (CELEBREX) 200 MG capsule Take 200 mg by mouth as needed.       . fexofenadine (ALLEGRA) 180 MG tablet Take 1 tablet (180 mg total) by mouth daily as needed.  90 tablet  3  . fluticasone (FLONASE) 50 MCG/ACT nasal  spray Place 2 sprays into the nose daily. During Allergy Season      . levothyroxine (SYNTHROID, LEVOTHROID) 50 MCG tablet TAKE 1 TABLET EVERY DAY  90 tablet  3  . losartan (COZAAR) 100 MG tablet TAKE 1 TABLET EVERY DAY  30 tablet  11  . propranolol ER (INDERAL LA) 120 MG 24 hr capsule Take one capsule by mouth daily  30 capsule  6  . venlafaxine XR (EFFEXOR-XR) 150 MG 24 hr capsule Take 1 capsule (150 mg total) by mouth daily.  30 capsule  11  . zolpidem (AMBIEN) 10  MG tablet Take 1 tablet (10 mg total) by mouth at bedtime as needed.  30 tablet  5   No current facility-administered medications for this visit.    Allergies  Allergen Reactions  . Latex     More than a day causes blisters  . Penicillins     REACTION: shock    Review of Systems negative except from HPI and PMH  Physical Exam BP 132/84  Pulse 74  Ht 5\' 9"  (1.753 m)  Wt 212 lb (96.163 kg)  BMI 31.29 kg/m2 Well developed and well nourished in no acute distress HENT normal E scleral and icterus clear Neck Supple JVP flat; carotids brisk and full Clear to ausculation  Regular rate and rhythm, 2/6 murmur that actually decreases with Valsalva Soft with active bowel sounds No clubbing cyanosis 1+ Edema Alert and oriented, grossly normal motor and sensory function Skin Warm and Dry  ECG demonstrates sinus rhythm without acute abnormalities  Assessment and  Plan

## 2012-10-02 NOTE — Patient Instructions (Addendum)
Your physician recommends that you have lab work today: TSH/CBCD/BMET  Your physician has recommended you make the following change in your medication:  1) Increase Propanolol to 240 mg daily 2) Start Aspirin 81 mg  Dr. Graciela Husbands will be in touch after talking with Dr. Gala Romney and question catheterization procedure

## 2012-10-03 ENCOUNTER — Encounter (HOSPITAL_COMMUNITY): Payer: Self-pay | Admitting: Respiratory Therapy

## 2012-10-03 LAB — BASIC METABOLIC PANEL
CO2: 28 mEq/L (ref 19–32)
Calcium: 9 mg/dL (ref 8.4–10.5)
Creatinine, Ser: 0.9 mg/dL (ref 0.4–1.2)
Glucose, Bld: 108 mg/dL — ABNORMAL HIGH (ref 70–99)
Potassium: 4.1 mEq/L (ref 3.5–5.1)

## 2012-10-04 LAB — TSH: TSH: 3.04 u[IU]/mL (ref 0.35–5.50)

## 2012-10-07 ENCOUNTER — Telehealth (HOSPITAL_COMMUNITY): Payer: Self-pay | Admitting: *Deleted

## 2012-10-07 ENCOUNTER — Encounter (HOSPITAL_COMMUNITY)
Admission: RE | Disposition: A | Discharge status: Home / Self Care | Payer: Self-pay | Source: Ambulatory Visit | Attending: Internal Medicine

## 2012-10-07 ENCOUNTER — Ambulatory Visit (HOSPITAL_COMMUNITY)
Admission: RE | Admit: 2012-10-07 | Discharge: 2012-10-07 | Disposition: A | Discharge status: Home / Self Care | Payer: BC Managed Care – PPO | Source: Ambulatory Visit | Attending: Internal Medicine | Admitting: Internal Medicine

## 2012-10-07 DIAGNOSIS — R079 Chest pain, unspecified: Secondary | ICD-10-CM

## 2012-10-07 DIAGNOSIS — I10 Essential (primary) hypertension: Secondary | ICD-10-CM | POA: Insufficient documentation

## 2012-10-07 DIAGNOSIS — I421 Obstructive hypertrophic cardiomyopathy: Secondary | ICD-10-CM

## 2012-10-07 DIAGNOSIS — R61 Generalized hyperhidrosis: Secondary | ICD-10-CM | POA: Insufficient documentation

## 2012-10-07 DIAGNOSIS — Z79899 Other long term (current) drug therapy: Secondary | ICD-10-CM | POA: Insufficient documentation

## 2012-10-07 DIAGNOSIS — Z8249 Family history of ischemic heart disease and other diseases of the circulatory system: Secondary | ICD-10-CM | POA: Diagnosis not present

## 2012-10-07 DIAGNOSIS — R0600 Dyspnea, unspecified: Secondary | ICD-10-CM

## 2012-10-07 DIAGNOSIS — E785 Hyperlipidemia, unspecified: Secondary | ICD-10-CM | POA: Insufficient documentation

## 2012-10-07 DIAGNOSIS — R0609 Other forms of dyspnea: Secondary | ICD-10-CM | POA: Insufficient documentation

## 2012-10-07 DIAGNOSIS — R55 Syncope and collapse: Secondary | ICD-10-CM

## 2012-10-07 DIAGNOSIS — R0602 Shortness of breath: Secondary | ICD-10-CM

## 2012-10-07 DIAGNOSIS — R0989 Other specified symptoms and signs involving the circulatory and respiratory systems: Secondary | ICD-10-CM | POA: Insufficient documentation

## 2012-10-07 HISTORY — PX: LEFT AND RIGHT HEART CATHETERIZATION WITH CORONARY ANGIOGRAM: SHX5449

## 2012-10-07 LAB — POCT I-STAT 3, ART BLOOD GAS (G3+)
O2 Saturation: 96 %
pCO2 arterial: 36.7 mmHg (ref 35.0–45.0)
pO2, Arterial: 82 mmHg (ref 80.0–100.0)

## 2012-10-07 LAB — POCT I-STAT 3, VENOUS BLOOD GAS (G3P V)
Acid-base deficit: 2 mmol/L (ref 0.0–2.0)
Acid-base deficit: 3 mmol/L — ABNORMAL HIGH (ref 0.0–2.0)
Bicarbonate: 22 mEq/L (ref 20.0–24.0)
Bicarbonate: 24.5 mEq/L — ABNORMAL HIGH (ref 20.0–24.0)
O2 Saturation: 57 %
O2 Saturation: 62 %
O2 Saturation: 63 %
TCO2: 23 mmol/L (ref 0–100)
TCO2: 26 mmol/L (ref 0–100)
pCO2, Ven: 44.1 mmHg — ABNORMAL LOW (ref 45.0–50.0)
pCO2, Ven: 46.7 mmHg (ref 45.0–50.0)
pH, Ven: 7.298 (ref 7.250–7.300)
pH, Ven: 7.306 — ABNORMAL HIGH (ref 7.250–7.300)

## 2012-10-07 SURGERY — LEFT AND RIGHT HEART CATHETERIZATION WITH CORONARY ANGIOGRAM
Anesthesia: LOCAL

## 2012-10-07 MED ORDER — ONDANSETRON HCL 4 MG/2ML IJ SOLN: 4.0000 mg | Freq: Four times a day (QID) | INTRAMUSCULAR | Status: DC | PRN

## 2012-10-07 MED ORDER — HEPARIN (PORCINE) IN NACL 2-0.9 UNIT/ML-% IJ SOLN
INTRAMUSCULAR | Status: AC
  Filled 2012-10-07: qty 1000

## 2012-10-07 MED ORDER — SODIUM CHLORIDE 0.9 % IV SOLN: INTRAVENOUS | Status: DC

## 2012-10-07 MED ORDER — ASPIRIN 81 MG PO CHEW
81.0000 mg | CHEWABLE_TABLET | ORAL | Status: AC
  Administered 2012-10-07: 81 mg via ORAL

## 2012-10-07 MED ORDER — LIDOCAINE HCL (PF) 1 % IJ SOLN
INTRAMUSCULAR | Status: AC
  Filled 2012-10-07: qty 30

## 2012-10-07 MED ORDER — ASPIRIN 81 MG PO CHEW
CHEWABLE_TABLET | ORAL | Status: AC
  Filled 2012-10-07: qty 1

## 2012-10-07 MED ORDER — MIDAZOLAM HCL 2 MG/2ML IJ SOLN
INTRAMUSCULAR | Status: AC
  Filled 2012-10-07: qty 2

## 2012-10-07 MED ORDER — DIAZEPAM 5 MG PO TABS
5.0000 mg | ORAL_TABLET | ORAL | Status: AC
  Administered 2012-10-07: 5 mg via ORAL

## 2012-10-07 MED ORDER — FENTANYL CITRATE 0.05 MG/ML IJ SOLN
INTRAMUSCULAR | Status: AC
  Filled 2012-10-07: qty 2

## 2012-10-07 MED ORDER — DIAZEPAM 5 MG PO TABS
ORAL_TABLET | ORAL | Status: AC
  Filled 2012-10-07: qty 1

## 2012-10-07 MED ORDER — NITROGLYCERIN 0.2 MG/ML ON CALL CATH LAB
INTRAVENOUS | Status: AC
  Filled 2012-10-07: qty 1

## 2012-10-07 MED ORDER — ACETAMINOPHEN 325 MG PO TABS: 650.0000 mg | ORAL_TABLET | ORAL | Status: DC | PRN

## 2012-10-07 MED ORDER — SODIUM CHLORIDE 0.9 % IV SOLN
1.0000 mL/kg/h | INTRAVENOUS | Status: DC
  Administered 2012-10-07: 1 mL/kg/h via INTRAVENOUS

## 2012-10-07 NOTE — Interval H&P Note (Signed)
Cath Lab Visit (complete for each Cath Lab visit)  Clinical Evaluation Leading to the Procedure:   ACS: no  Non-ACS:    Anginal Classification: CCS II  Anti-ischemic medical therapy: Minimal Therapy (1 class of medications)  Non-Invasive Test Results: No non-invasive testing performed  Prior CABG: No previous CABG      History and Physical Interval Note:  10/07/2012 7:48 AM  [name]  has presented today for surgery, with the diagnosis of cm/hp  The various methods of treatment have been discussed with the patient and family. After consideration of risks, benefits and other options for treatment, the patient has consented to  Procedure(s): LEFT AND RIGHT HEART CATHETERIZATION WITH CORONARY ANGIOGRAM (N/A) as a surgical intervention .  The patient's history has been reviewed, patient examined, no change in status, stable for surgery.  I have reviewed the patient's chart and labs.  Questions were answered to the patient's satisfaction.     Daniel Bensimhon

## 2012-10-07 NOTE — Telephone Encounter (Signed)
Per Dr Gala Romney pt needs CPX test next week for dyspnea, order placed, Chantel can you please schedule, thanks

## 2012-10-07 NOTE — H&P (View-Only) (Signed)
Patient Care Team: James W John, MD as PCP - General   HPI  [name] is a 57 y.o. female  Seen  Because of worsening of chest pain in the context of   recently identified HCM .She also has hx of dysautonomia which recently became less problematic Also with hx of atrila arrhythmia  Shortness of breath and chest pain has been progressive over the summer. She is not limited to less than 100 feet. Is associated with radiation from her sternum into her neck and is accompanied by diaphoresis and shortness of breath Her sob seems to correlate with edema.  Cardiac risk factors include hyperlipidemia, hypertension family history   Intercurrent risk stratification includes a 48 hour Holter demonstrating no ventricular tachycardia and blood pressure response to exercise was excessive-normal  MRI findings showed 16 mm in the basal septum; there is no gadolinium enhancement. 11 mm inferolateral wall.       Past Medical History  Diagnosis Date  . Chronic sinus infection 04/12/2010  . Abdominal pain, epigastric 10/25/2007  . Abdominal pain, right lower quadrant 11/20/2006  . Abdominal pain, right upper quadrant 10/23/2006  . ADD 01/20/2009  . ADIPOSITY, LOCALIZED 10/15/2006  . ALLERGIC RHINITIS 10/15/2006  . ANGIOEDEMA 08/12/2008  . ANIMAL BITE 10/07/2009  . ANXIETY DEPRESSION 04/04/2007  . ANXIETY 10/15/2006  . ASTHMATIC BRONCHITIS, ACUTE 01/27/2008  . Cellulitis and abscess of leg, except foot 08/12/2007  . CELLULITIS, FACE 12/21/2008  . CHEST PAIN 06/14/2007  . COLECTOMY, PARTIAL, WITH ANASTOMOSIS, HX OF 10/15/2006  . COLONIC POLYPS, HX OF 10/15/2006  . Contusion of unspecified site 04/21/2008  . CYST, OVARIAN NEC/NOS 10/15/2006  . Dengue 02/22/2010  . DEPRESSION 10/15/2006  . DVT, HX OF 10/15/2006  . DYSAUTONOMIA 10/15/2006  . Dysfunction of eustachian tube 05/26/2009  . EAR PAIN, BILATERAL 08/16/2007  . GI BLEEDING 04/04/2007  . Glossitis 03/06/2008  . HEADACHE, CHRONIC 04/04/2007  .  Hematemesis 10/25/2007  . HEMORRHOIDS 04/04/2007  . HYPERLIPIDEMIA 12/12/2007  . HYPERTENSION 10/15/2006  . HYPOTHYROIDISM 03/06/2008  . IBS 04/04/2007  . MASTECTOMY, BILATERAL, HX OF 08/12/2009  . NECK MASS 07/01/2007  . NEOP, MALIGNANT, FEMALE BREAST NOS 10/15/2006  . 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 OTITIS MEDIA, ACUTE, BILATERAL 12/12/2007  . RASH-NONVESICULAR 02/13/2008  . RECTAL BLEEDING 04/04/2007  . SHY-DRAGER SYNDROME 01/17/2007  . SINUSITIS- ACUTE-NOS 10/15/2006  . SYNCOPE 07/01/2007  . URI 07/29/2007  . VERTIGO 01/17/2007  . VIRAL INFECTION 02/17/2010  . VITAMIN D DEFICIENCY 03/06/2008  . Wheezing 08/20/2008  . WOUND, OPEN, LEG, WITHOUT COMPLICATION 08/16/2007    Past Surgical History  Procedure Laterality Date  . Abdominal hysterectomy    . Oophorectomy    . Cholecystectomy    . Spigellian hernia    . Multiple ablations    . Bilateral mastectomy/reconstruction    . Cesarean section      Current Outpatient Prescriptions  Medication Sig Dispense Refill  . acetaminophen-codeine (TYLENOL #3) 300-30 MG per tablet TAKE 1 TABLET BY MOUTH FOUR TIMES DAILY AS NEEDED FOR PAIN  120 tablet  1  . cefUROXime (CEFTIN) 250 MG tablet Take 1 tablet (250 mg total) by mouth 2 (two) times daily.  20 tablet  1  . celecoxib (CELEBREX) 200 MG capsule Take 200 mg by mouth as needed.       . fexofenadine (ALLEGRA) 180 MG tablet Take 1 tablet (180 mg total) by mouth daily as needed.  90 tablet  3  . fluticasone (FLONASE) 50 MCG/ACT nasal   spray Place 2 sprays into the nose daily. During Allergy Season      . levothyroxine (SYNTHROID, LEVOTHROID) 50 MCG tablet TAKE 1 TABLET EVERY DAY  90 tablet  3  . losartan (COZAAR) 100 MG tablet TAKE 1 TABLET EVERY DAY  30 tablet  11  . propranolol ER (INDERAL LA) 120 MG 24 hr capsule Take one capsule by mouth daily  30 capsule  6  . venlafaxine XR (EFFEXOR-XR) 150 MG 24 hr capsule Take 1 capsule (150 mg total) by mouth daily.  30 capsule  11  . zolpidem (AMBIEN) 10  MG tablet Take 1 tablet (10 mg total) by mouth at bedtime as needed.  30 tablet  5   No current facility-administered medications for this visit.    Allergies  Allergen Reactions  . Latex     More than a day causes blisters  . Penicillins     REACTION: shock    Review of Systems negative except from HPI and PMH  Physical Exam BP 132/84  Pulse 74  Ht 5' 9" (1.753 m)  Wt 212 lb (96.163 kg)  BMI 31.29 kg/m2 Well developed and well nourished in no acute distress HENT normal E scleral and icterus clear Neck Supple JVP flat; carotids brisk and full Clear to ausculation  Regular rate and rhythm, 2/6 murmur that actually decreases with Valsalva Soft with active bowel sounds No clubbing cyanosis 1+ Edema Alert and oriented, grossly normal motor and sensory function Skin Warm and Dry  ECG demonstrates sinus rhythm without acute abnormalities  Assessment and  Plan  

## 2012-10-07 NOTE — CV Procedure (Signed)
Cardiac Cath Procedure Note  Indication: CP, HCM  Procedures performed:  1) Right heart catheterization with saline bag challenge 2) Selective coronary angiography 3) Left heart catheterization 4) Left ventriculogram  Description of procedure:     The risks and indication of the procedure were explained. Consent was signed and placed on the chart. An appropriate timeout was taken prior to the procedure. The right groin was prepped and draped in the routine sterile fashion and anesthetized with 1% local lidocaine.   A 5 FR arterial sheath was placed in the right femoral artery using a modified Seldinger technique. Standard catheters including a JL4, JR4 and angled pigtail were used. All catheter exchanges were made over a wire. A 7 FR venous sheath was placed in the right femoral vein using a modified Seldinger technique. A standard Swan-Ganz catheter was used for the procedure.   The patient performed 4 mins of vigorous saline bag arm exercises and PA and PCWP pressures were re-sampled. Max HR with saline bag exercises was ~75bpm  Complications:  None apparent  Findings:  RA = 5 RV = 31/6/9 PA = 29/13 (20) PCW = 11 Fick cardiac output/index = 4.1/1.9 PVR = 1.9 WU FA sat = 96% PA sat = 62%, 63% SVC sat = 57%  After saline bag arm exercise (peak HR ~75bpm): PA 43/17 (30) PCWP = 15  Ao Pressure: 138/78 (105) LV Pressure: 156/8/20  There was no signficant gradient across the aortic valve on pullback.  Left main: normal   LAD: normal with myocardial bridging segment in midsection  LCX: normal  RCA: large dominant vessel. normal  LV-gram done in the RAO projection:  Hyperdynamic with mid cavity obliteration Ejection fraction = 75-80%  Assessment: 1. Normal coronary arteries 2. Normal RH pressures at rest 3. Hyperdynamic LV with midcavity obliteration at rest 4. No evidence intracardiac shunting 5. Mild elevation in PA pressures with submaximal saline bag  exercise  Plan/Discussion:  On LV gram she clearly has a hyperdynamic ventricle with midcavity obliteration which I suspect is likely contributing to her symptoms. PA pressures increased only minimally with submax exercise. I suspect her weight may also be contributing to her dyspnea. Willproceed with CPX testing to help sort out relative components.  Truman Hayward 9:39 AM

## 2012-10-10 ENCOUNTER — Telehealth: Payer: Self-pay | Admitting: Internal Medicine

## 2012-10-10 NOTE — Telephone Encounter (Signed)
New Problem  Pt's husband request a call back to follow up on the prevous Cath procedure... He states that he has not heard from anyone in regards to future testing. Please call

## 2012-10-11 NOTE — Telephone Encounter (Signed)
Explained that I would follow up with Dr. Graciela Husbands on Monday when he is back in office. I explained that we will be in touch about possible treadmill testing. Patient verbalized understanding and agreeable to plan.

## 2012-10-16 NOTE — Telephone Encounter (Signed)
Patient already set up with CPX for next week. I left message that if she has any further questions/concerns to please call us back.

## 2012-10-21 NOTE — Telephone Encounter (Signed)
cpx sch for 10/22

## 2012-10-23 ENCOUNTER — Encounter (HOSPITAL_COMMUNITY): Payer: BC Managed Care – PPO

## 2012-11-07 ENCOUNTER — Other Ambulatory Visit: Payer: Self-pay

## 2012-11-07 ENCOUNTER — Ambulatory Visit (HOSPITAL_COMMUNITY): Payer: BC Managed Care – PPO | Attending: Internal Medicine

## 2012-11-07 DIAGNOSIS — R0602 Shortness of breath: Secondary | ICD-10-CM

## 2012-11-07 DIAGNOSIS — R0989 Other specified symptoms and signs involving the circulatory and respiratory systems: Secondary | ICD-10-CM | POA: Insufficient documentation

## 2012-11-07 DIAGNOSIS — R079 Chest pain, unspecified: Secondary | ICD-10-CM | POA: Insufficient documentation

## 2012-11-07 DIAGNOSIS — R0609 Other forms of dyspnea: Secondary | ICD-10-CM | POA: Insufficient documentation

## 2012-11-08 ENCOUNTER — Telehealth: Payer: Self-pay | Admitting: Internal Medicine

## 2012-11-08 NOTE — Telephone Encounter (Signed)
Left message on personal answering machine that results haven't been read yet and that I would be in touch next week

## 2012-11-08 NOTE — Telephone Encounter (Signed)
New message    Pt had stress test at [facility] yesterday and would like someone to call her with the results.

## 2012-11-11 ENCOUNTER — Telehealth: Payer: Self-pay | Admitting: Physician Assistant

## 2012-11-11 NOTE — Telephone Encounter (Signed)
Patient called answering service after hours this evening. Had CPX on 11/07/12. Has felt generally poorly since that time. No acute chest pain or dyspnea but "I've been living my life on the couch." No syncope. Normal cath recently but has HOCM. I told her it's difficult to fully evaluate/treat over the phone without evaluating her in person, and advised that if she is feeling poorly and is concerned, she should proceed to ER. Alternatively if she feels generally stable, she should call office first thing in AM to obtain an appointment. She elected the latter. She knows to go to ER if sx worsen. Alexzavier Girardin PA-C

## 2012-11-11 NOTE — Telephone Encounter (Signed)
Follow up    Pt do not feel well---want stress test results as soon as possible.

## 2012-11-11 NOTE — Telephone Encounter (Signed)
Left message explaining to try Dr. Gala Romney office - he was ordering physician, and his nurse may have results.

## 2012-11-12 ENCOUNTER — Encounter: Payer: Self-pay | Admitting: Internal Medicine

## 2012-11-12 ENCOUNTER — Telehealth: Payer: Self-pay | Admitting: Internal Medicine

## 2012-11-12 ENCOUNTER — Ambulatory Visit (INDEPENDENT_AMBULATORY_CARE_PROVIDER_SITE_OTHER): Payer: BC Managed Care – PPO | Admitting: Internal Medicine

## 2012-11-12 VITALS — BP 142/93 | HR 67 | Ht 69.0 in | Wt 210.1 lb

## 2012-11-12 DIAGNOSIS — I421 Obstructive hypertrophic cardiomyopathy: Secondary | ICD-10-CM

## 2012-11-12 DIAGNOSIS — R0609 Other forms of dyspnea: Secondary | ICD-10-CM

## 2012-11-12 DIAGNOSIS — R079 Chest pain, unspecified: Secondary | ICD-10-CM

## 2012-11-12 DIAGNOSIS — I10 Essential (primary) hypertension: Secondary | ICD-10-CM

## 2012-11-12 DIAGNOSIS — R0989 Other specified symptoms and signs involving the circulatory and respiratory systems: Secondary | ICD-10-CM

## 2012-11-12 DIAGNOSIS — R0600 Dyspnea, unspecified: Secondary | ICD-10-CM

## 2012-11-12 DIAGNOSIS — R0602 Shortness of breath: Secondary | ICD-10-CM

## 2012-11-12 NOTE — Telephone Encounter (Signed)
Spoke with patient's husband who states patient has been experiencing neck pain, fatigue, dizziness, headache, SOB, and nausea since Thursday 11/6 when she had an cardiopulmonary exercise test.  Patient's husband states the symptoms intensify with exertion.  They do not have any BP readings to report; husband denies that patient is experiencing heart racing, vomiting, fever or cough.  Husband reports patient is compliant with medications.  I advised husband that I would talk to Dr. Prescott Gum office to see if he has an appointment available as Dr. Graciela Husbands is out of the office this week.  Patient's husband verbalized understanding and agreement. I spoke with the CMA in Dr. Prescott Gum office who advised that there are no appointments available today per Meredith Staggers, RN.  I will talk with Dr. Ladona Ridgel, DOD for advice.

## 2012-11-12 NOTE — Assessment & Plan Note (Signed)
The  Etiology of her chest discomfort is still unclear but concerning for subendocardial ischemia secondary to her hypertrophic cardiomyopathy. As she is on a fairly large dose of propranolol , and her blood pressure remains elevated , I've recommended that the patient try verapamil 180 mg daily. She was given this prescription previously by Dr. Graciela Husbands but has not taken it. Alternative treatments would be to consider disopyramide. We await the results of her cardiopulmonary stress test.

## 2012-11-12 NOTE — Assessment & Plan Note (Signed)
Her blood pressure is elevated today. Hopefully the verapamil will improve her pressure.

## 2012-11-12 NOTE — Telephone Encounter (Signed)
New Problem     Pt's husband called pt is having bad indigestion, dizziness and headaches all the time started last Thursday th Nov. After GXT symptoms  got worse.    Pt would like the result of GXT please.

## 2012-11-12 NOTE — Telephone Encounter (Signed)
I spoke with Dr. Ladona Ridgel who advised that if patient wants to be seen today that she may be added to his schedule.  I advised the patient that we are working her in to Dr. Lubertha Basque schedule and advised her that she may have to wait once she arrives.  Patient verbalized understanding and agreement and was placed on the schedule at 12:45; I advised patient to arrive at 12:15.

## 2012-11-12 NOTE — Progress Notes (Signed)
HPI [name] returns today as a Doc of the day add on. She is a pleasant 57 yo woman with a h/o SVT s/p ablation, nearly 20 years ago. She has developed increasing sob and chest pain about one year ago and initially placed on beta blocker therapy with little improvement. She has subsequently undergone MRI and echo demonstrating a septal wall thickness of 16-18 mm but minimal if any obstruction. She underwent cardiopulmonary stress testing with an abnormal ECG but we do not yet have the details. She comes in today c/o several days of worsening chest pain and sob. No syncope. She can walk slowly and feel ok but with an increase in intensity, she has reproducible chest pain and sob. At times she will get light headed but denies palpitations. She has been on propranolol 240 mg daily.  Allergies  Allergen Reactions  . Fish Allergy Swelling  . Latex     More than a day causes blisters  . Penicillins     REACTION: shock     Current Outpatient Prescriptions  Medication Sig Dispense Refill  . acetaminophen-codeine (TYLENOL #3) 300-30 MG per tablet Take 1 tablet by mouth every 6 (six) hours as needed for pain.      [name] levothyroxine (SYNTHROID, LEVOTHROID) 50 MCG tablet Take 50 mcg by mouth daily before breakfast.      . propranolol ER (INDERAL LA) 120 MG 24 hr capsule Take two capsules daily (240 mg total)  60 capsule  6  . venlafaxine XR (EFFEXOR-XR) 150 MG 24 hr capsule Take 1 capsule (150 mg total) by mouth daily.  30 capsule  11  . zolpidem (AMBIEN) 10 MG tablet Take 1 tablet (10 mg total) by mouth at bedtime as needed.  30 tablet  5   No current facility-administered medications for this visit.     Past Medical History  Diagnosis Date  . Chronic sinus infection 04/12/2010  . Abdominal pain, epigastric 10/25/2007  . Abdominal pain, right lower quadrant 11/20/2006  . Abdominal pain, right upper quadrant 10/23/2006  . ADD 01/20/2009  . ADIPOSITY, LOCALIZED 10/15/2006  . ALLERGIC  RHINITIS 10/15/2006  . ANGIOEDEMA 08/12/2008  . ANIMAL BITE 10/07/2009  . ANXIETY DEPRESSION 04/04/2007  . ANXIETY 10/15/2006  . ASTHMATIC BRONCHITIS, ACUTE 01/27/2008  . Cellulitis and abscess of leg, except foot 08/12/2007  . CELLULITIS, FACE 12/21/2008  . CHEST PAIN 06/14/2007  . COLECTOMY, PARTIAL, WITH ANASTOMOSIS, HX OF 10/15/2006  . COLONIC POLYPS, HX OF 10/15/2006  . Contusion of unspecified site 04/21/2008  . CYST, OVARIAN NEC/NOS 10/15/2006  . Dengue 02/22/2010  . DEPRESSION 10/15/2006  . DVT, HX OF 10/15/2006  . DYSAUTONOMIA 10/15/2006  . Dysfunction of eustachian tube 05/26/2009  . EAR PAIN, BILATERAL 08/16/2007  . GI BLEEDING 04/04/2007  . Glossitis 03/06/2008  . HEADACHE, CHRONIC 04/04/2007  . Hematemesis 10/25/2007  . HEMORRHOIDS 04/04/2007  . HYPERLIPIDEMIA 12/12/2007  . HYPERTENSION 10/15/2006  . HYPOTHYROIDISM 03/06/2008  . IBS 04/04/2007  . MASTECTOMY, BILATERAL, HX OF 08/12/2009  . NECK MASS 07/01/2007  . NEOP, MALIGNANT, FEMALE BREAST NOS 10/15/2006  . 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 OTITIS MEDIA, ACUTE, BILATERAL 12/12/2007  . RASH-NONVESICULAR 02/13/2008  . RECTAL BLEEDING 04/04/2007  . SHY-DRAGER SYNDROME 01/17/2007  . SINUSITIS- ACUTE-NOS 10/15/2006  . SYNCOPE 07/01/2007  . URI 07/29/2007  . VERTIGO 01/17/2007  . VIRAL INFECTION 02/17/2010  . VITAMIN D DEFICIENCY 03/06/2008  . Wheezing 08/20/2008  . WOUND, OPEN, LEG, WITHOUT COMPLICATION 08/16/2007  ROS:   All systems reviewed and negative except as noted in the HPI.   Past Surgical History  Procedure Laterality Date  . Abdominal hysterectomy    . Oophorectomy    . Cholecystectomy    . Spigellian hernia    . Multiple ablations    . Bilateral mastectomy/reconstruction    . Cesarean section       Family History  Problem Relation Age of Onset  . Cancer Sister     ovarian cancer     History   Social History  . Marital Status: Married    Spouse Name: N/A    Number of Children: N/A  . Years of  Education: N/A   Occupational History  . Not on file.   Social History Main Topics  . Smoking status: Never Smoker   . Smokeless tobacco: Not on file  . Alcohol Use: No  . Drug Use: No  . Sexual Activity: Not on file   Other Topics Concern  . Not on file   Social History Narrative   [place] times per year.   Non smoking           BP 142/93  Pulse 67  Ht 5\' 9"  (1.753 m)  Wt 210 lb 1.9 oz (95.31 kg)  BMI 31.02 kg/m2  Physical Exam:  Well appearing middle aged woman, NAD HEENT: Unremarkable Neck:  No JVD, no thyromegally Back:  No CVA tenderness Lungs:  Clear with no wheezes HEART:  Regular rate rhythm, no murmurs, no rubs, no clicks Abd:  soft, positive bowel sounds, no organomegally, no rebound, no guarding Ext:  2 plus pulses, no edema, no cyanosis, no clubbing Skin:  No rashes no nodules Neuro:  CN II through XII intact, motor grossly intact  EKG - NSR with nonspecific STT changes  Assess/Plan:

## 2012-11-12 NOTE — Patient Instructions (Signed)
Your physician recommends that you continue on your current medications as directed. Please refer to the Current Medication list given to you today.  Your physician recommends that you schedule a follow-up appointment in: 3-4 weeks with Dr. Graciela Husbands

## 2012-11-12 NOTE — Assessment & Plan Note (Signed)
I suspect this is multifactorial but suspiciously related to diastolic dysfunction in the setting of subendocardial ischemia.  That said, the results of her left and right heart catheterization are not particularly helpful.

## 2012-11-16 ENCOUNTER — Other Ambulatory Visit: Payer: Self-pay | Admitting: Internal Medicine

## 2012-11-18 NOTE — Telephone Encounter (Signed)
Ok to refill? Last OV 8.11.14

## 2012-11-18 NOTE — Telephone Encounter (Signed)
Done hardcopy to robin  

## 2012-11-19 NOTE — Telephone Encounter (Signed)
Patient complaining of heart pain, SOB, can't breath at times, and worsening symptoms - she is requesting earlier appointment with Dr. Graciela Husbands than 12/10. I will discuss with Dr. Graciela Husbands and get back with her. She is agreeable to plan.

## 2012-11-19 NOTE — Telephone Encounter (Signed)
Faxed hardcopy to [facility] [place] 

## 2012-11-19 NOTE — Telephone Encounter (Signed)
Follow up     Pt want to talk to Dr Odessa Fleming nurse.  She refused to give me any more details; however, she did say it was a follow up to her phone call made on last week.

## 2012-11-19 NOTE — Telephone Encounter (Signed)
Earlier appointment made - 11/20 at 9:30 am per patient request. Cancelled 12/10 appointment. Patient verbalized understanding and agreeable to plan.

## 2012-11-21 ENCOUNTER — Ambulatory Visit (INDEPENDENT_AMBULATORY_CARE_PROVIDER_SITE_OTHER): Payer: BC Managed Care – PPO | Admitting: Internal Medicine

## 2012-11-21 ENCOUNTER — Encounter: Payer: Self-pay | Admitting: Internal Medicine

## 2012-11-21 VITALS — BP 125/76 | HR 71 | Ht 69.0 in | Wt 213.0 lb

## 2012-11-21 DIAGNOSIS — R0600 Dyspnea, unspecified: Secondary | ICD-10-CM

## 2012-11-21 DIAGNOSIS — R0989 Other specified symptoms and signs involving the circulatory and respiratory systems: Secondary | ICD-10-CM

## 2012-11-21 DIAGNOSIS — Q078 Other specified congenital malformations of nervous system: Secondary | ICD-10-CM

## 2012-11-21 DIAGNOSIS — R0602 Shortness of breath: Secondary | ICD-10-CM

## 2012-11-21 DIAGNOSIS — R0609 Other forms of dyspnea: Secondary | ICD-10-CM

## 2012-11-21 DIAGNOSIS — I421 Obstructive hypertrophic cardiomyopathy: Secondary | ICD-10-CM

## 2012-11-21 MED ORDER — DILTIAZEM HCL ER COATED BEADS 120 MG PO CP24: 120.0000 mg | ORAL_CAPSULE | Freq: Every day | ORAL | Status: DC

## 2012-11-21 NOTE — Assessment & Plan Note (Signed)
CPX >> subendocardial ischemia  Also chronotropic limitation >>117 Will change CCB and  Think about changing betablockers to atenolol which she is tolerated we will also arrange for her to see Dr. Modesto Charon at [facility]. We had a very lengthy discussion regarding the physiology of subendocardial ischemia and the possibility also of exercise associated obstruction which was not evident at rest. She does have a murmur that increases with Valsalva suggestive of exercise associated obstruction.

## 2012-11-21 NOTE — Assessment & Plan Note (Signed)
As above We also discussed the fact that the HCM is a progressive condition and has been relatively rapidly so in her over the last 5-6 years associated with significant problems with symptoms.  As relates or dyspnea on exertion dyspnea M.D. we discussed her waking. We have talked about using her heart rate monitor so as to be able to see if there is a heart rate which she can exercise with minimal symptoms. Specifically, this might be 90 beats per minute. We talked about low effort treadmill exercise.

## 2012-11-21 NOTE — Patient Instructions (Addendum)
Your physician has recommended you make the following change in your medication:  1) Stop Verapamil 2) Start Cardizem 120 mg daily  Dr. Graciela Husbands will contact Dr. Modesto Charon about getting you an appointment  Please call us in two weeks and let us know how you are doing after medication changes.  Your physician has requested that you have a stress echocardiogram. For further information please visit [email or web address] Please follow instruction sheet as given.

## 2012-11-21 NOTE — Assessment & Plan Note (Signed)
As above Suspect that this is HFpEF in the context of her HTN

## 2012-11-21 NOTE — Assessment & Plan Note (Signed)
She's having some dizziness with standing. I suspect this relates to her autonomic dysfunction which in large part has resolved attributed to healing and prayer. We have discussed the importance of leg motion to maintain venous return and I will be reluctant to push salt and water given her history of dyspnea and diastolic dysfunction with HFpE

## 2012-11-21 NOTE — Progress Notes (Signed)
Patient Care Team: Corwin Levins, MD as PCP - General   HPI  [name] is a 57 y.o. female Seen in followup for chest pain and shortness of breath associated with a interval diagnosis of HCM.  She underwent catheterization which demonstrated no obstruction. Cardiopulmonary stress testing demonstrated ST segment depression suggestive of subendocardial ischemia as a possible mechanism. She saw Dr. Leonia Reeves last week and hr on verapamil. We discussed her seeing Dr. Mervyn Skeeters few W. at [facility]  She is no better with the verapamil. She thinks that the fatigue is worse. She notes also dyspnea with bending. She is aware that she is put on a great deal of weight.  Past Medical History  Diagnosis Date  . Chronic sinus infection 04/12/2010  . Abdominal pain, epigastric 10/25/2007  . Abdominal pain, right lower quadrant 11/20/2006  . Abdominal pain, right upper quadrant 10/23/2006  . ADD 01/20/2009  . ADIPOSITY, LOCALIZED 10/15/2006  . ALLERGIC RHINITIS 10/15/2006  . ANGIOEDEMA 08/12/2008  . ANIMAL BITE 10/07/2009  . ANXIETY DEPRESSION 04/04/2007  . ANXIETY 10/15/2006  . ASTHMATIC BRONCHITIS, ACUTE 01/27/2008  . Cellulitis and abscess of leg, except foot 08/12/2007  . CELLULITIS, FACE 12/21/2008  . CHEST PAIN 06/14/2007  . COLECTOMY, PARTIAL, WITH ANASTOMOSIS, HX OF 10/15/2006  . COLONIC POLYPS, HX OF 10/15/2006  . Contusion of unspecified site 04/21/2008  . CYST, OVARIAN NEC/NOS 10/15/2006  . Dengue 02/22/2010  . DEPRESSION 10/15/2006  . DVT, HX OF 10/15/2006  . DYSAUTONOMIA 10/15/2006  . Dysfunction of eustachian tube 05/26/2009  . EAR PAIN, BILATERAL 08/16/2007  . GI BLEEDING 04/04/2007  . Glossitis 03/06/2008  . HEADACHE, CHRONIC 04/04/2007  . Hematemesis 10/25/2007  . HEMORRHOIDS 04/04/2007  . HYPERLIPIDEMIA 12/12/2007  . HYPERTENSION 10/15/2006  . HYPOTHYROIDISM 03/06/2008  . IBS 04/04/2007  . MASTECTOMY, BILATERAL, HX OF 08/12/2009  . NECK MASS 07/01/2007  . NEOP, MALIGNANT, FEMALE BREAST NOS  10/15/2006  . 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 OTITIS MEDIA, ACUTE, BILATERAL 12/12/2007  . RASH-NONVESICULAR 02/13/2008  . RECTAL BLEEDING 04/04/2007  . SHY-DRAGER SYNDROME 01/17/2007  . SINUSITIS- ACUTE-NOS 10/15/2006  . SYNCOPE 07/01/2007  . URI 07/29/2007  . VERTIGO 01/17/2007  . VIRAL INFECTION 02/17/2010  . VITAMIN D DEFICIENCY 03/06/2008  . Wheezing 08/20/2008  . WOUND, OPEN, LEG, WITHOUT COMPLICATION 08/16/2007    Past Surgical History  Procedure Laterality Date  . Abdominal hysterectomy    . Oophorectomy    . Cholecystectomy    . Spigellian hernia    . Multiple ablations    . Bilateral mastectomy/reconstruction    . Cesarean section      Current Outpatient Prescriptions  Medication Sig Dispense Refill  . acetaminophen-codeine (TYLENOL #3) 300-30 MG per tablet TAKE 1 TABLET BY MOUTH FOUR TIMES DAILY AS NEEDED FOR PAIN  120 tablet  1  . levothyroxine (SYNTHROID, LEVOTHROID) 50 MCG tablet Take 50 mcg by mouth daily before breakfast.      . propranolol ER (INDERAL LA) 120 MG 24 hr capsule Take two capsules daily (240 mg total)  60 capsule  6  . venlafaxine XR (EFFEXOR-XR) 150 MG 24 hr capsule Take 1 capsule (150 mg total) by mouth daily.  30 capsule  11  . verapamil (VERELAN) 100 MG 24 hr capsule Take 100 mg by mouth at bedtime. Take in conjunction with other medications.      [name] zolpidem (AMBIEN) 10 MG tablet Take 1 tablet (10 mg total) by mouth at bedtime as needed.  30 tablet  5   No current facility-administered medications for this visit.    Allergies  Allergen Reactions  . Fish Allergy Swelling  . Latex     More than a day causes blisters  . Penicillins     REACTION: shock    Review of Systems negative except from HPI and PMH  Physical Exam BP 125/76  Pulse 71  Ht 5\' 9"  (1.753 m)  Wt 213 lb (96.616 kg)  BMI 31.44 kg/m2 Well developed and well nourished in no acute distress HENT normal E scleral and icterus clear Neck Supple JVP flat; carotids brisk and  full Clear to ausculation  *Regular rate and rhythm, 2/6 murmur notable with Valsalva that r decreases with release no gallops or rub Soft with active bowel sounds No clubbing cyanosis 1+ Edema Alert and oriented, grossly normal motor and sensory function Skin Warm and Dry    Assessment and  Plan

## 2012-11-27 ENCOUNTER — Other Ambulatory Visit: Payer: Self-pay | Admitting: *Deleted

## 2012-11-27 DIAGNOSIS — I421 Obstructive hypertrophic cardiomyopathy: Secondary | ICD-10-CM

## 2012-12-03 ENCOUNTER — Ambulatory Visit: Payer: BC Managed Care – PPO | Admitting: Internal Medicine

## 2012-12-09 ENCOUNTER — Encounter: Payer: Self-pay | Admitting: Cardiology

## 2012-12-09 ENCOUNTER — Telehealth: Payer: Self-pay | Admitting: *Deleted

## 2012-12-09 ENCOUNTER — Ambulatory Visit (HOSPITAL_COMMUNITY): Payer: BC Managed Care – PPO | Attending: Cardiology

## 2012-12-09 DIAGNOSIS — I421 Obstructive hypertrophic cardiomyopathy: Secondary | ICD-10-CM | POA: Insufficient documentation

## 2012-12-09 NOTE — Telephone Encounter (Signed)
Dr. Graciela Husbands called back and stated that it is "ok" for patient to have the Stress Echo test today. He is aware that she took her medications. Spoke to Frederick in the Stress Echo lab and she verbalized understanding and appreciation for clarification.

## 2012-12-09 NOTE — Progress Notes (Signed)
Stress echo performed. 

## 2012-12-09 NOTE — Telephone Encounter (Signed)
Provided Erie Noe in the Stress Echo lab the pager number for Dr. Graciela Husbands, for follow-up purposes.

## 2012-12-09 NOTE — Telephone Encounter (Signed)
Erie Noe from Stress Echo lab called stating that Dr. Odessa Fleming patient inadvertently took her Cardizem last night and her Propanolol this morning. Indication is LV function and Velocity through Obstruction. Dr. Graciela Husbands paged to see if she needs to be rescheduled. Dr. Graciela Husbands will call back in 15 minutes, as he is currently scrubbed in on a case at the hospital.

## 2012-12-11 ENCOUNTER — Ambulatory Visit: Payer: BC Managed Care – PPO | Admitting: Internal Medicine

## 2012-12-19 ENCOUNTER — Ambulatory Visit (INDEPENDENT_AMBULATORY_CARE_PROVIDER_SITE_OTHER): Payer: BC Managed Care – PPO | Admitting: Internal Medicine

## 2012-12-19 ENCOUNTER — Encounter: Payer: Self-pay | Admitting: Internal Medicine

## 2012-12-19 VITALS — BP 120/82 | HR 71 | Temp 98.6°F

## 2012-12-19 DIAGNOSIS — I10 Essential (primary) hypertension: Secondary | ICD-10-CM

## 2012-12-19 DIAGNOSIS — F3289 Other specified depressive episodes: Secondary | ICD-10-CM | POA: Diagnosis not present

## 2012-12-19 DIAGNOSIS — H6692 Otitis media, unspecified, left ear: Secondary | ICD-10-CM

## 2012-12-19 DIAGNOSIS — H6690 Otitis media, unspecified, unspecified ear: Secondary | ICD-10-CM

## 2012-12-19 DIAGNOSIS — F329 Major depressive disorder, single episode, unspecified: Secondary | ICD-10-CM | POA: Diagnosis not present

## 2012-12-19 MED ORDER — FLUTICASONE PROPIONATE 50 MCG/ACT NA SUSP: 2.0000 | Freq: Every day | NASAL | Status: DC

## 2012-12-19 MED ORDER — CEFUROXIME AXETIL 250 MG PO TABS: 250.0000 mg | ORAL_TABLET | Freq: Two times a day (BID) | ORAL | Status: DC

## 2012-12-19 NOTE — Patient Instructions (Signed)
Please take all new medication as prescribed Please continue all other medications as before, and refills have been done if requested. Please continue your efforts at being more active, low cholesterol diet, and weight control.  Please keep your appointments with your specialists as you have planned - [facility]

## 2012-12-19 NOTE — Progress Notes (Signed)
Subjective:    Patient ID: [name], female    DOB: 04-05-1955, 57 y.o.   MRN: [id number]  HPI  Here with 2 day onset severe left ear pain, fever, mild reduced hearing and mild dizziness, without HA, sinus pain, ST, cough and Pt denies chest pain, increased sob or doe, wheezing, orthopnea, PND, increased LE swelling, palpitations, dizziness or syncope.  Pt denies new neurological symptoms such as new headache, or facial or extremity weakness or numbness   Pt denies polydipsia, polyuria.  Denies worsening depressive symptoms, suicidal ideation, or panic; Past Medical History  Diagnosis Date  . Chronic sinus infection 04/12/2010  . Abdominal pain, epigastric 10/25/2007  . ADD 01/20/2009  . ALLERGIC RHINITIS 10/15/2006  . ANGIOEDEMA 08/12/2008  . ANXIETY 10/15/2006  . ASTHMATIC BRONCHITIS, ACUTE 01/27/2008  . Cellulitis and abscess of leg, except foot 08/12/2007  . CELLULITIS, FACE 12/21/2008  . CHEST PAIN 06/14/2007  . COLECTOMY, PARTIAL, WITH ANASTOMOSIS, HX OF 10/15/2006  . COLONIC POLYPS, HX OF 10/15/2006  . CYST, OVARIAN NEC/NOS 10/15/2006  . Dengue 02/22/2010  . DVT, HX OF 10/15/2006  . DYSAUTONOMIA 10/15/2006  . Dysfunction of eustachian tube 05/26/2009  . GI BLEEDING 04/04/2007  . Glossitis 03/06/2008  . HEADACHE, CHRONIC 04/04/2007  . Hematemesis 10/25/2007  . HEMORRHOIDS 04/04/2007  . HYPERLIPIDEMIA 12/12/2007  . HYPERTENSION 10/15/2006  . HYPOTHYROIDISM 03/06/2008  . IBS 04/04/2007  . MASTECTOMY, BILATERAL, HX OF 08/12/2009  . NECK MASS 07/01/2007  . NEOP, MALIGNANT, FEMALE BREAST NOS 10/15/2006  . 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 SYNCOPE 07/01/2007  . Wheezing 08/20/2008  . WOUND, OPEN, LEG, WITHOUT COMPLICATION 08/16/2007   Past Surgical History  Procedure Laterality Date  . Abdominal hysterectomy    . Oophorectomy    . Cholecystectomy    . Spigellian hernia    . Multiple ablations    . Bilateral mastectomy/reconstruction    . Cesarean section      reports that she  has never smoked. She does not have any smokeless tobacco history on file. She reports that she does not drink alcohol or use illicit drugs. family history includes Cancer in her sister. Allergies  Allergen Reactions  . Fish Allergy Swelling  . Latex     More than a day causes blisters  . Penicillins     REACTION: shock   Current Outpatient Prescriptions on File Prior to Visit  Medication Sig Dispense Refill  . acetaminophen-codeine (TYLENOL #3) 300-30 MG per tablet TAKE 1 TABLET BY MOUTH FOUR TIMES DAILY AS NEEDED FOR PAIN  120 tablet  1  . diltiazem (CARDIZEM CD) 120 MG 24 hr capsule Take 1 capsule (120 mg total) by mouth daily.  30 capsule  11  . levothyroxine (SYNTHROID, LEVOTHROID) 50 MCG tablet Take 50 mcg by mouth daily before breakfast.      . propranolol ER (INDERAL LA) 120 MG 24 hr capsule Take two capsules daily (240 mg total)  60 capsule  6  . venlafaxine XR (EFFEXOR-XR) 150 MG 24 hr capsule Take 1 capsule (150 mg total) by mouth daily.  30 capsule  11  . zolpidem (AMBIEN) 10 MG tablet Take 1 tablet (10 mg total) by mouth at bedtime as needed.  30 tablet  5   No current facility-administered medications on file prior to visit.   Review of Systems  Constitutional: Negative for unexpected weight change, or unusual diaphoresis  HENT: Negative for tinnitus.   Eyes: Negative for photophobia and visual disturbance.  Respiratory: Negative  for choking and stridor.   Gastrointestinal: Negative for vomiting and blood in stool.  Genitourinary: Negative for hematuria and decreased urine volume.  Musculoskeletal: Negative for acute joint swelling Skin: Negative for color change and wound.  Neurological: Negative for tremors and numbness other than noted  Psychiatric/Behavioral: Negative for decreased concentration or  hyperactivity.       Objective:   Physical Exam BP 120/82  Pulse 71  Temp(Src) 98.6 F (37 C) (Oral)  SpO2 97% .VS noted,  Constitutional: Pt appears  well-developed and well-nourished.  HENT: Head: NCAT.  Right Ear: External ear normal.  Left Ear: External ear normal.  Bilat tm's with erythema left 3+, right 1+.  Max sinus areas non tender.  Pharynx with mild erythema, no exudate Eyes: Conjunctivae and EOM are normal. Pupils are equal, round, and reactive to light.  Neck: Normal range of motion. Neck supple.  Cardiovascular: Normal rate and regular rhythm.   Pulmonary/Chest: Effort normal and breath sounds normal.  Neurological: Pt is alert. Not confused  Skin: Skin is warm. No erythema.  Psychiatric: Pt behavior is normal. Thought content normal. not depressed affect    Assessment & Plan:

## 2012-12-25 DIAGNOSIS — H6692 Otitis media, unspecified, left ear: Secondary | ICD-10-CM | POA: Insufficient documentation

## 2012-12-25 NOTE — Assessment & Plan Note (Signed)
Mild to mod, for antibx course,  to f/u any worsening symptoms or concerns 

## 2012-12-25 NOTE — Assessment & Plan Note (Signed)
stable overall by history and exam, recent data reviewed with pt, and pt to continue medical treatment as before,  to f/u any worsening symptoms or concerns BP Readings from Last 3 Encounters:  12/19/12 120/82  11/21/12 125/76  11/12/12 142/93

## 2012-12-25 NOTE — Assessment & Plan Note (Signed)
stable overall by history and exam, recent data reviewed with pt, and pt to continue medical treatment as before,  to f/u any worsening symptoms or concerns Lab Results  Component Value Date   WBC 6.2 10/02/2012   HGB 13.0 10/02/2012   HCT 38.2 10/02/2012   PLT 298.0 10/02/2012   GLUCOSE 108* 10/02/2012   CHOL 209* 12/13/2011   TRIG 195.0* 12/13/2011   HDL 45.10 12/13/2011   LDLDIRECT 137.4 12/13/2011   LDLCALC 73 02/11/2008   ALT 28 12/13/2011   AST 28 12/13/2011   NA 139 10/02/2012   K 4.1 10/02/2012   CL 106 10/02/2012   CREATININE 0.9 10/02/2012   BUN 10 10/02/2012   CO2 28 10/02/2012   TSH 3.04 10/02/2012   INR 0.95 10/07/2012   HGBA1C  Value: 5.2 (NOTE)   The ADA recommends the following therapeutic goal for glycemic   control related to Hgb A1C measurement:   Goal of Therapy:   < 7.0% Hgb A1C   Reference: American Diabetes Association: Clinical Practice   Recommendations 2008, Diabetes Care,  2008, 31:(Suppl 1). 08/12/2007    

## 2012-12-30 DIAGNOSIS — I422 Other hypertrophic cardiomyopathy: Secondary | ICD-10-CM | POA: Diagnosis not present

## 2013-01-06 ENCOUNTER — Telehealth: Payer: Self-pay | Admitting: Internal Medicine

## 2013-01-06 DIAGNOSIS — Z006 Encounter for examination for normal comparison and control in clinical research program: Secondary | ICD-10-CM | POA: Diagnosis not present

## 2013-01-06 NOTE — Telephone Encounter (Signed)
New problem    Pt claim she has had genetic testing for HCM. They need results of these finding. Please call

## 2013-01-06 NOTE — Telephone Encounter (Signed)
Lm that unclear when HCM testing done.  Advised takes 12-14 wks for results to come back

## 2013-01-07 NOTE — Telephone Encounter (Signed)
Follow up   Returning nurses phone call.

## 2013-01-08 NOTE — Telephone Encounter (Signed)
Follow up    Want to know if pt had genetic testing done. Please advise

## 2013-01-09 DIAGNOSIS — Z006 Encounter for examination for normal comparison and control in clinical research program: Secondary | ICD-10-CM | POA: Diagnosis not present

## 2013-01-09 NOTE — Telephone Encounter (Signed)
Patient did have [facility] testing done in 06/2012. I did not receive a copy of the report. Contacted [facility] and report was faxed. I have left a message for [name] that this was done and negative. I have stated she can call me back if needed.

## 2013-01-13 NOTE — Telephone Encounter (Signed)
I spoke with [name]. Copy of report faxed to [phone].

## 2013-01-19 ENCOUNTER — Other Ambulatory Visit: Payer: Self-pay | Admitting: Internal Medicine

## 2013-01-21 NOTE — Telephone Encounter (Signed)
Faxed hardcopy to [place]

## 2013-01-27 ENCOUNTER — Other Ambulatory Visit: Payer: Self-pay | Admitting: Internal Medicine

## 2013-02-11 DIAGNOSIS — J209 Acute bronchitis, unspecified: Secondary | ICD-10-CM | POA: Diagnosis not present

## 2013-02-11 DIAGNOSIS — J019 Acute sinusitis, unspecified: Secondary | ICD-10-CM | POA: Diagnosis not present

## 2013-02-12 ENCOUNTER — Ambulatory Visit: Payer: BC Managed Care – PPO | Admitting: Internal Medicine

## 2013-02-14 ENCOUNTER — Ambulatory Visit: Payer: BC Managed Care – PPO | Admitting: Internal Medicine

## 2013-02-26 ENCOUNTER — Other Ambulatory Visit: Payer: Self-pay | Admitting: Internal Medicine

## 2013-02-26 NOTE — Telephone Encounter (Signed)
Too soon for tylenol #3, I think should still have 1 refill

## 2013-02-26 NOTE — Telephone Encounter (Signed)
Called left a detailed message of MD instructions. 

## 2013-03-10 DIAGNOSIS — Z006 Encounter for examination for normal comparison and control in clinical research program: Secondary | ICD-10-CM | POA: Diagnosis not present

## 2013-03-31 ENCOUNTER — Encounter: Payer: Self-pay | Admitting: Internal Medicine

## 2013-04-01 ENCOUNTER — Other Ambulatory Visit: Payer: Self-pay

## 2013-04-01 MED ORDER — ACETAMINOPHEN-CODEINE #3 300-30 MG PO TABS: ORAL_TABLET | ORAL | Status: DC

## 2013-04-01 NOTE — Telephone Encounter (Signed)
Done hardcopy to robin  

## 2013-04-02 NOTE — Telephone Encounter (Signed)
Faxed hardcopy to [facility] 

## 2013-04-07 DIAGNOSIS — I422 Other hypertrophic cardiomyopathy: Secondary | ICD-10-CM | POA: Diagnosis not present

## 2013-04-07 DIAGNOSIS — I498 Other specified cardiac arrhythmias: Secondary | ICD-10-CM | POA: Diagnosis not present

## 2013-04-10 ENCOUNTER — Encounter: Payer: Self-pay | Admitting: Internal Medicine

## 2013-04-10 ENCOUNTER — Ambulatory Visit (INDEPENDENT_AMBULATORY_CARE_PROVIDER_SITE_OTHER): Payer: BC Managed Care – PPO | Admitting: Internal Medicine

## 2013-04-10 VITALS — BP 120/80 | HR 72 | Temp 97.6°F

## 2013-04-10 DIAGNOSIS — I10 Essential (primary) hypertension: Secondary | ICD-10-CM | POA: Diagnosis not present

## 2013-04-10 DIAGNOSIS — H9209 Otalgia, unspecified ear: Secondary | ICD-10-CM | POA: Diagnosis not present

## 2013-04-10 DIAGNOSIS — H9201 Otalgia, right ear: Secondary | ICD-10-CM

## 2013-04-10 DIAGNOSIS — H9202 Otalgia, left ear: Secondary | ICD-10-CM | POA: Insufficient documentation

## 2013-04-10 DIAGNOSIS — F411 Generalized anxiety disorder: Secondary | ICD-10-CM | POA: Diagnosis not present

## 2013-04-10 MED ORDER — CEFUROXIME AXETIL 250 MG PO TABS: 250.0000 mg | ORAL_TABLET | Freq: Two times a day (BID) | ORAL | Status: DC

## 2013-04-10 NOTE — Assessment & Plan Note (Signed)
stable overall by history and exam, recent data reviewed with pt, and pt to continue medical treatment as before,  to f/u any worsening symptoms or concerns Lab Results  Component Value Date   WBC 6.2 10/02/2012   HGB 13.0 10/02/2012   HCT 38.2 10/02/2012   PLT 298.0 10/02/2012   GLUCOSE 108* 10/02/2012   CHOL 209* 12/13/2011   TRIG 195.0* 12/13/2011   HDL 45.10 12/13/2011   LDLDIRECT 137.4 12/13/2011   LDLCALC 73 02/11/2008   ALT 28 12/13/2011   AST 28 12/13/2011   NA 139 10/02/2012   K 4.1 10/02/2012   CL 106 10/02/2012   CREATININE 0.9 10/02/2012   BUN 10 10/02/2012   CO2 28 10/02/2012   TSH 3.04 10/02/2012   INR 0.95 10/07/2012   HGBA1C  Value: 5.2 (NOTE)   The ADA recommends the following therapeutic goal for glycemic   control related to Hgb A1C measurement:   Goal of Therapy:   < 7.0% Hgb A1C   Reference: American Diabetes Association: Clinical Practice   Recommendations 2008, Diabetes Care,  2008, 31:(Suppl 1). 08/12/2007

## 2013-04-10 NOTE — Patient Instructions (Addendum)
Please take all new medication as prescribed - the antibiotic  Please continue all other medications as before, and refills have been done if requested. Please have the pharmacy call with any other refills you may need.  Please continue your efforts at being more active, low cholesterol diet, and weight control.  [name] at your [facility] evaluation.

## 2013-04-10 NOTE — Progress Notes (Signed)
Pre visit review using our clinic review tool, if applicable. No additional management support is needed unless otherwise documented below in the visit note. 

## 2013-04-10 NOTE — Assessment & Plan Note (Signed)
Mild to mod, for antibx course,  to f/u any worsening symptoms or concerns 

## 2013-04-10 NOTE — Assessment & Plan Note (Signed)
stable overall by history and exam, recent data reviewed with pt, and pt to continue medical treatment as before,  to f/u any worsening symptoms or concerns BP Readings from Last 3 Encounters:  04/10/13 120/80  12/19/12 120/82  11/21/12 125/76

## 2013-04-10 NOTE — Progress Notes (Signed)
Subjective:    Patient ID: [name], female    DOB: Apr 04, 1955, 58 y.o.   MRN: [id number]  HPI  Here with acute onset right ear pain, fever, dizzy slight hearing reduction, general weakness and malaise for several days, Pt denies chest pain, increased sob or doe, wheezing, orthopnea, PND, increased LE swelling, palpitations, dizziness or syncope. Pt denies polydipsia, polyuria, Pt denies new neurological symptoms such as new headache, or facial or extremity weakness or numbness.  Had episode of cxr proven pna per pt, seen at [facility] in [place] a fewmo ago.  For [facility] card eval this Mon.  Quite limited in activty, less than 100 ft with any grade uptwards.   Denies worsening depressive symptoms, suicidal ideation, or panic Past Medical History  Diagnosis Date  . Chronic sinus infection 04/12/2010  . Abdominal pain, epigastric 10/25/2007  . ADD 01/20/2009  . ALLERGIC RHINITIS 10/15/2006  . ANGIOEDEMA 08/12/2008  . ANXIETY 10/15/2006  . ASTHMATIC BRONCHITIS, ACUTE 01/27/2008  . Cellulitis and abscess of leg, except foot 08/12/2007  . CELLULITIS, [place] 12/21/2008  . CHEST PAIN 06/14/2007  . COLECTOMY, PARTIAL, WITH ANASTOMOSIS, HX OF 10/15/2006  . COLONIC POLYPS, HX OF 10/15/2006  . CYST, OVARIAN NEC/NOS 10/15/2006  . Dengue 02/22/2010  . DVT, HX OF 10/15/2006  . DYSAUTONOMIA 10/15/2006  . Dysfunction of eustachian tube 05/26/2009  . GI BLEEDING 04/04/2007  . Glossitis 03/06/2008  . HEADACHE, CHRONIC 04/04/2007  . Hematemesis 10/25/2007  . HEMORRHOIDS 04/04/2007  . HYPERLIPIDEMIA 12/12/2007  . HYPERTENSION 10/15/2006  . HYPOTHYROIDISM 03/06/2008  . IBS 04/04/2007  . MASTECTOMY, BILATERAL, HX OF 08/12/2009  . NECK MASS 07/01/2007  . NEOP, MALIGNANT, FEMALE BREAST NOS 10/15/2006  . 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 SYNCOPE 07/01/2007  . Wheezing 08/20/2008  . WOUND, OPEN, LEG, WITHOUT COMPLICATION 5/63/1497   Past Surgical History  Procedure Laterality Date  . Abdominal hysterectomy    .  Oophorectomy    . Cholecystectomy    . Spigellian hernia    . Multiple ablations    . Bilateral mastectomy/reconstruction    . Cesarean section      reports that she has never smoked. She does not have any smokeless tobacco history on file. She reports that she does not drink alcohol or use illicit drugs. family history includes Cancer in her sister. Allergies  Allergen Reactions  . Fish Allergy Swelling  . Latex     More than a day causes blisters  . Penicillins     REACTION: shock   Current Outpatient Prescriptions on File Prior to Visit  Medication Sig Dispense Refill  . acetaminophen-codeine (TYLENOL #3) 300-30 MG per tablet TAKE 1 TABLET BY MOUTH FOUR TIMES DAILY AS NEEDED FOR PAIN  120 tablet  1  . diltiazem (CARDIZEM CD) 120 MG 24 hr capsule Take 1 capsule (120 mg total) by mouth daily.  30 capsule  11  . fluticasone (FLONASE) 50 MCG/ACT nasal spray Place 2 sprays into both nostrils daily.  16 g  5  . levothyroxine (SYNTHROID, LEVOTHROID) 50 MCG tablet Take 50 mcg by mouth daily before breakfast.      . propranolol ER (INDERAL LA) 120 MG 24 hr capsule Take two capsules daily (240 mg total)  60 capsule  6  . venlafaxine XR (EFFEXOR-XR) 150 MG 24 hr capsule TAKE ONE CAPSULE BY MOUTH DAILY  30 capsule  11  . zolpidem (AMBIEN) 10 MG tablet Take 1 tablet (10 mg total) by mouth at bedtime as needed.  30 tablet  5   No current facility-administered medications on file prior to visit.    Review of Systems  Constitutional: Negative for unexpected weight change, or unusual diaphoresis  HENT: Negative for tinnitus.   Eyes: Negative for photophobia and visual disturbance.  Respiratory: Negative for choking and stridor.   Gastrointestinal: Negative for vomiting and blood in stool.  Genitourinary: Negative for hematuria and decreased urine volume.  Musculoskeletal: Negative for acute joint swelling Skin: Negative for color change and wound.  Neurological: Negative for tremors and  numbness other than noted  Psychiatric/Behavioral: Negative for decreased concentration or  hyperactivity.       Objective:   Physical Exam BP 120/80  Pulse 72  Temp(Src) 97.6 F (36.4 C) (Oral)  SpO2 99% VS noted, mildill Constitutional: Pt appears well-developed and well-nourished.  HENT: Head: NCAT.  Right Ear: External ear normal.  Left Ear: External ear normal.  Bilat tm's with mild erythema.  Max sinus areas tender.  Pharynx with mild erythema, no exudate Eyes: Conjunctivae and EOM are normal. Pupils are equal, round, and reactive to light.  Neck: Normal range of motion. Neck supple.  Cardiovascular: Normal rate and regular rhythm.   Pulmonary/Chest: Effort normal and breath sounds normal.  Neurological: Pt is alert. Not confused  Skin: Skin is warm. No erythema.  Psychiatric: Pt behavior is normal. Thought content normal.      Assessment & Plan:

## 2013-05-16 ENCOUNTER — Telehealth: Payer: Self-pay

## 2013-05-16 MED ORDER — CEFUROXIME AXETIL 250 MG PO TABS: 250.0000 mg | ORAL_TABLET | Freq: Two times a day (BID) | ORAL | Status: DC

## 2013-05-16 NOTE — Telephone Encounter (Signed)
Done erx 

## 2013-05-16 NOTE — Telephone Encounter (Signed)
The patient is flying to [place] tomorrow (05/17/13) and would like a refill on her ceftin as she almost always gets an ear infection when flying.

## 2013-05-31 ENCOUNTER — Other Ambulatory Visit: Payer: Self-pay | Admitting: Internal Medicine

## 2013-06-03 NOTE — Telephone Encounter (Signed)
Done hardcopy to robin  

## 2013-06-04 NOTE — Telephone Encounter (Signed)
Faxed hardcopy to [facility] 

## 2013-08-11 ENCOUNTER — Other Ambulatory Visit: Payer: Self-pay | Admitting: Internal Medicine

## 2013-08-12 NOTE — Telephone Encounter (Signed)
Done hardcopy to robin  

## 2013-08-12 NOTE — Telephone Encounter (Signed)
Faxed hardcopy to [facility]

## 2013-08-21 ENCOUNTER — Ambulatory Visit: Payer: BC Managed Care – PPO | Admitting: Internal Medicine

## 2013-08-22 ENCOUNTER — Ambulatory Visit (INDEPENDENT_AMBULATORY_CARE_PROVIDER_SITE_OTHER): Payer: BC Managed Care – PPO | Admitting: Internal Medicine

## 2013-08-22 ENCOUNTER — Encounter: Payer: Self-pay | Admitting: Internal Medicine

## 2013-08-22 VITALS — BP 110/72 | HR 81 | Temp 98.0°F

## 2013-08-22 DIAGNOSIS — H6980 Other specified disorders of Eustachian tube, unspecified ear: Secondary | ICD-10-CM

## 2013-08-22 DIAGNOSIS — J328 Other chronic sinusitis: Secondary | ICD-10-CM

## 2013-08-22 DIAGNOSIS — H61899 Other specified disorders of external ear, unspecified ear: Secondary | ICD-10-CM

## 2013-08-22 DIAGNOSIS — H9390 Unspecified disorder of ear, unspecified ear: Secondary | ICD-10-CM

## 2013-08-22 DIAGNOSIS — I10 Essential (primary) hypertension: Secondary | ICD-10-CM

## 2013-08-22 MED ORDER — CEFUROXIME AXETIL 250 MG PO TABS: 250.0000 mg | ORAL_TABLET | Freq: Two times a day (BID) | ORAL | Status: DC

## 2013-08-22 MED ORDER — LEVOTHYROXINE SODIUM 50 MCG PO TABS: 50.0000 ug | ORAL_TABLET | Freq: Every day | ORAL | Status: DC

## 2013-08-22 NOTE — Patient Instructions (Signed)
Please take all new medication as prescribed - the antibiotic  Please continue all other medications as before, and refills have been done if requested - the levothyroxine  Please have the pharmacy call with any other refills you may need.  You will be contacted regarding the referral for: [place] for the left ear spot  Please keep your appointments with your specialists as you may have planned

## 2013-08-22 NOTE — Progress Notes (Signed)
Subjective:    Patient ID: [name], female    DOB: [birth year], 58 y.o.   MRN: [id number]  HPI  Here to f/u after several others in family ill last with,  Here with 2-3 days acute onset fever, left ear pain > right, facial pain, pressure, headache, general weakness and malaise, and yellowish d/c, with mild ST and cough, but pt denies chest pain, wheezing, increased sob or doe, orthopnea, PND, increased LE swelling, palpitations, dizziness or syncope. Pt denies new neurological symptoms such as new headache, or facial or extremity weakness or numbness   Pt denies polydipsia, polyuria.  Also with popping/crackling both ears. Also with a raised lesion to left ear enlarging in recent months - ? cancer  Past Medical History  Diagnosis Date  . Chronic sinus infection 04/12/2010  . Abdominal pain, epigastric 10/25/2007  . ADD 01/20/2009  . ALLERGIC RHINITIS 10/15/2006  . ANGIOEDEMA 08/12/2008  . ANXIETY 10/15/2006  . ASTHMATIC BRONCHITIS, ACUTE 01/27/2008  . Cellulitis and abscess of leg, except foot 08/12/2007  . CELLULITIS, [place] 12/21/2008  . CHEST PAIN 06/14/2007  . COLECTOMY, PARTIAL, WITH ANASTOMOSIS, HX OF 10/15/2006  . COLONIC POLYPS, HX OF 10/15/2006  . CYST, OVARIAN NEC/NOS 10/15/2006  . Dengue 02/22/2010  . DVT, HX OF 10/15/2006  . DYSAUTONOMIA 10/15/2006  . Dysfunction of eustachian tube 05/26/2009  . GI BLEEDING 04/04/2007  . Glossitis 03/06/2008  . HEADACHE, CHRONIC 04/04/2007  . Hematemesis 10/25/2007  . HEMORRHOIDS 04/04/2007  . HYPERLIPIDEMIA 12/12/2007  . HYPERTENSION 10/15/2006  . HYPOTHYROIDISM 03/06/2008  . IBS 04/04/2007  . MASTECTOMY, BILATERAL, HX OF 08/12/2009  . NECK MASS 07/01/2007  . NEOP, MALIGNANT, FEMALE BREAST NOS 10/15/2006  . 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 SYNCOPE 07/01/2007  . Wheezing 08/20/2008  . WOUND, OPEN, LEG, WITHOUT COMPLICATION 06/08/3708   Past Surgical History  Procedure Laterality Date  . Abdominal hysterectomy    . Oophorectomy    .  Cholecystectomy    . Spigellian hernia    . Multiple ablations    . Bilateral mastectomy/reconstruction    . Cesarean section      reports that she has never smoked. She does not have any smokeless tobacco history on file. She reports that she does not drink alcohol or use illicit drugs. family history includes Cancer in her sister. Allergies  Allergen Reactions  . Fish Allergy Swelling  . Latex     More than a day causes blisters  . Penicillins     REACTION: shock   Current Outpatient Prescriptions on File Prior to Visit  Medication Sig Dispense Refill  . acetaminophen-codeine (TYLENOL #3) 300-30 MG per tablet TAKE ONE TABLET FOUR TIMES DAILY AS NEEDED FOR PAIN  120 tablet  2  . diltiazem (CARDIZEM CD) 120 MG 24 hr capsule Take 1 capsule (120 mg total) by mouth daily.  30 capsule  11  . fluticasone (FLONASE) 50 MCG/ACT nasal spray Place 2 sprays into both nostrils daily.  16 g  5  . propranolol ER (INDERAL LA) 120 MG 24 hr capsule Take two capsules daily (240 mg total)  60 capsule  6  . venlafaxine XR (EFFEXOR-XR) 150 MG 24 hr capsule TAKE ONE CAPSULE BY MOUTH DAILY  30 capsule  11  . zolpidem (AMBIEN) 10 MG tablet Take 1 tablet (10 mg total) by mouth at bedtime as needed.  30 tablet  5   No current facility-administered medications on file prior to visit.   Review of Systems  Constitutional:  Negative for unusual diaphoresis or other sweats  HENT: Negative for ringing in ear Eyes: Negative for double vision or worsening visual disturbance.  Respiratory: Negative for choking and stridor.   Gastrointestinal: Negative for vomiting or other signifcant bowel change Genitourinary: Negative for hematuria or decreased urine volume.  Musculoskeletal: Negative for other MSK pain or swelling Skin: Negative for color change and worsening wound.  Neurological: Negative for tremors and numbness other than noted  Psychiatric/Behavioral: Negative for decreased concentration or agitation other  than above      Objective:   Physical Exam BP 110/72  Pulse 81  Temp(Src) 98 F (36.7 C) (Oral)  SpO2 96% VS noted,  Constitutional: Pt appears well-developed, well-nourished.  HENT: Head: NCAT.  Right Ear: External ear normal.  Left Ear: External ear normal.  Eyes: . Pupils are equal, round, and reactive to light. Conjunctivae and EOM are normal Neck: Normal range of motion. Neck supple.  Bilat tm's with mild erythema left > right.  Max sinus areas mild tender.  Pharynx with mild erythema, no exudate Cardiovascular: Normal rate and regular rhythm.   Pulmonary/Chest: Effort normal and breath sounds normal.  Neurological: Pt is alert. Not confused , motor grossly intact Skin: Skin is warm. No rash; left pinna with 8 mm raised yellow/tan smooth lesion near tragus Psychiatric: Pt behavior is normal. No agitation. mild nervous    Assessment & Plan:

## 2013-08-22 NOTE — Progress Notes (Signed)
Pre visit review using our clinic review tool, if applicable. No additional management support is needed unless otherwise documented below in the visit note. 

## 2013-08-24 DIAGNOSIS — H9390 Unspecified disorder of ear, unspecified ear: Secondary | ICD-10-CM | POA: Insufficient documentation

## 2013-08-24 NOTE — Assessment & Plan Note (Signed)
With flare - Mild to mod, for antibx course,  to f/u any worsening symptoms or concerns

## 2013-08-24 NOTE — Assessment & Plan Note (Signed)
stable overall by history and exam, recent data reviewed with pt, and pt to continue medical treatment as before,  to f/u any worsening symptoms or concerns BP Readings from Last 3 Encounters:  08/22/13 110/72  04/10/13 120/80  12/19/12 120/82

## 2013-08-24 NOTE — Assessment & Plan Note (Signed)
For mucinex bid prn,  to f/u any worsening symptoms or concerns

## 2013-08-24 NOTE — Assessment & Plan Note (Signed)
Unclear etiology, seems benign in appearnace, pt asks for surgical referral as getting larger

## 2013-09-16 ENCOUNTER — Telehealth: Payer: Self-pay | Admitting: Internal Medicine

## 2013-09-16 MED ORDER — AZITHROMYCIN 250 MG PO TABS: ORAL_TABLET | ORAL | Status: DC

## 2013-09-16 NOTE — Telephone Encounter (Signed)
Called the patient left her a detailed message script sent in and detailed message of MD instructions.

## 2013-09-16 NOTE — Telephone Encounter (Signed)
Patient states ears are not any better.  She would like to know if Dr. [name] would like to call in a second round of antibiotics or if he would like her to come in?

## 2013-09-16 NOTE — Telephone Encounter (Signed)
lmtcb

## 2013-09-16 NOTE — Telephone Encounter (Signed)
Ok for zpack , also remember mucinex otc prn, OTC allegra or zyrtec, and daily use of the flonase can help as well

## 2013-09-29 ENCOUNTER — Other Ambulatory Visit: Payer: Self-pay | Admitting: *Deleted

## 2013-09-29 MED ORDER — PROPRANOLOL HCL ER 120 MG PO CP24: ORAL_CAPSULE | ORAL | Status: DC

## 2013-10-24 DIAGNOSIS — R197 Diarrhea, unspecified: Secondary | ICD-10-CM | POA: Diagnosis not present

## 2013-10-24 DIAGNOSIS — R112 Nausea with vomiting, unspecified: Secondary | ICD-10-CM | POA: Diagnosis not present

## 2013-11-24 ENCOUNTER — Other Ambulatory Visit: Payer: Self-pay | Admitting: Internal Medicine

## 2013-12-11 ENCOUNTER — Encounter (HOSPITAL_COMMUNITY): Payer: Self-pay | Admitting: Internal Medicine

## 2013-12-11 ENCOUNTER — Other Ambulatory Visit: Payer: Self-pay | Admitting: Internal Medicine

## 2013-12-11 NOTE — Telephone Encounter (Signed)
Done hardcopy to D

## 2013-12-12 NOTE — Telephone Encounter (Signed)
Notified pt rx ready for pick-up.../lmb 

## 2013-12-15 ENCOUNTER — Other Ambulatory Visit: Payer: Self-pay | Admitting: Internal Medicine

## 2013-12-15 NOTE — Telephone Encounter (Signed)
rx alreadyt done dec 10

## 2014-01-14 ENCOUNTER — Encounter: Payer: Self-pay | Admitting: Internal Medicine

## 2014-01-14 ENCOUNTER — Ambulatory Visit (INDEPENDENT_AMBULATORY_CARE_PROVIDER_SITE_OTHER): Payer: BLUE CROSS/BLUE SHIELD | Admitting: Internal Medicine

## 2014-01-14 VITALS — BP 142/90 | HR 69 | Temp 97.7°F

## 2014-01-14 DIAGNOSIS — J069 Acute upper respiratory infection, unspecified: Secondary | ICD-10-CM | POA: Diagnosis not present

## 2014-01-14 DIAGNOSIS — I10 Essential (primary) hypertension: Secondary | ICD-10-CM | POA: Diagnosis not present

## 2014-01-14 DIAGNOSIS — H539 Unspecified visual disturbance: Secondary | ICD-10-CM

## 2014-01-14 MED ORDER — CEFUROXIME AXETIL 250 MG PO TABS: 250.0000 mg | ORAL_TABLET | Freq: Two times a day (BID) | ORAL | Status: DC

## 2014-01-14 NOTE — Progress Notes (Signed)
Pre visit review using our clinic review tool, if applicable. No additional management support is needed unless otherwise documented below in the visit note. 

## 2014-01-14 NOTE — Progress Notes (Signed)
Subjective:    Patient ID: [name], female    DOB: 1955-01-08, 59 y.o.   MRN: [id number]  HPI   Here with 5 days acute onset fever, facial pain, left ear pressure, headache, general weakness and malaise, and greenish d/c, with mild ST and cough, but pt denies chest pain, wheezing, increased sob or doe, orthopnea, PND, increased LE swelling, palpitations, dizziness or syncope.  Also c/o left eye hot flashes of light, seems worse to tilt head to left, has hx of torn retina, ? Ocular migraine) Denies worsening depressive symptoms, suicidal ideation, or panic Past Medical History  Diagnosis Date  . Chronic sinus infection 04/12/2010  . Abdominal pain, epigastric 10/25/2007  . ADD 01/20/2009  . ALLERGIC RHINITIS 10/15/2006  . ANGIOEDEMA 08/12/2008  . ANXIETY 10/15/2006  . ASTHMATIC BRONCHITIS, ACUTE 01/27/2008  . Cellulitis and abscess of leg, except foot 08/12/2007  . CELLULITIS, [place] 12/21/2008  . CHEST PAIN 06/14/2007  . COLECTOMY, PARTIAL, WITH ANASTOMOSIS, HX OF 10/15/2006  . COLONIC POLYPS, HX OF 10/15/2006  . CYST, OVARIAN NEC/NOS 10/15/2006  . Dengue 02/22/2010  . DVT, HX OF 10/15/2006  . DYSAUTONOMIA 10/15/2006  . Dysfunction of eustachian tube 05/26/2009  . GI BLEEDING 04/04/2007  . Glossitis 03/06/2008  . HEADACHE, CHRONIC 04/04/2007  . Hematemesis 10/25/2007  . HEMORRHOIDS 04/04/2007  . HYPERLIPIDEMIA 12/12/2007  . HYPERTENSION 10/15/2006  . HYPOTHYROIDISM 03/06/2008  . IBS 04/04/2007  . MASTECTOMY, BILATERAL, HX OF 08/12/2009  . NECK MASS 07/01/2007  . NEOP, MALIGNANT, FEMALE BREAST NOS 10/15/2006  . 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 SYNCOPE 07/01/2007  . Wheezing 08/20/2008  . WOUND, OPEN, LEG, WITHOUT COMPLICATION 6/57/8469   Past Surgical History  Procedure Laterality Date  . Abdominal hysterectomy    . Oophorectomy    . Cholecystectomy    . Spigellian hernia    . Multiple ablations    . Bilateral mastectomy/reconstruction    . Cesarean section    . Left and right  heart catheterization with coronary angiogram N/A 10/07/2012    Procedure: LEFT AND RIGHT HEART CATHETERIZATION WITH CORONARY ANGIOGRAM;  Surgeon: Jolaine Artist, MD;  Location[facility] CATH LAB;  Service: Cardiovascular;  Laterality: N/A;    reports that she has never smoked. She does not have any smokeless tobacco history on file. She reports that she does not drink alcohol or use illicit drugs. family history includes Cancer in her sister. Allergies  Allergen Reactions  . Fish Allergy Swelling  . Latex     More than a day causes blisters  . Penicillins     REACTION: shock   Current Outpatient Prescriptions on File Prior to Visit  Medication Sig Dispense Refill  . acetaminophen-codeine (TYLENOL #3) 300-30 MG per tablet TAKE ONE TABLET FOUR TIMES DAILY AS NEEDED FOR PAIN 120 tablet 2  . diltiazem (CARDIZEM CD) 120 MG 24 hr capsule Take 1 capsule (120 mg total) by mouth daily. 30 capsule 11  . fluticasone (FLONASE) 50 MCG/ACT nasal spray Place 2 sprays into both nostrils daily. 16 g 5  . levothyroxine (SYNTHROID, LEVOTHROID) 50 MCG tablet Take 1 tablet (50 mcg total) by mouth daily before breakfast. 90 tablet 3  . propranolol ER (INDERAL LA) 120 MG 24 hr capsule TAKE TWO CAPSULES DAILY 60 capsule 0  . venlafaxine XR (EFFEXOR-XR) 150 MG 24 hr capsule TAKE ONE CAPSULE BY MOUTH DAILY 30 capsule 11  . zolpidem (AMBIEN) 10 MG tablet Take 1 tablet (10 mg total) by mouth at bedtime as  needed. 30 tablet 5   No current facility-administered medications on file prior to visit.   Review of Systems All otherwise neg per pt    Objective:   Physical Exam BP 142/90 mmHg  Pulse 69  Temp(Src) 97.7 F (36.5 C) (Oral)  Wt  VS noted,  Constitutional: Pt appears well-developed, well-nourished.  HENT: Head: NCAT.  Right Ear: External ear normal.  Left Ear: External ear normal.  Eyes: . Pupils are equal, round, and reactive to light. Conjunctivae and EOM are normal Neck: Normal range of motion. Neck  supple.  Cardiovascular: Normal rate and regular rhythm.   Pulmonary/Chest: Effort normal and breath sounds without rales or wheezing.  Abd:  Soft, NT, ND, + BS Neurological: Pt is alert. Not confused , motor grossly intact, cn 2-12 intact Skin: Skin is warm. No rash Psychiatric: Pt behavior is normal. No agitation.     Assessment & Plan:

## 2014-01-14 NOTE — Patient Instructions (Signed)
Please take all new medication as prescribed - the antibiotic  Please continue all other medications as before, and refills have been done if requested.  Please have the pharmacy call with any other refills you may need.  Please keep your appointments with your specialists as you may have planned   

## 2014-01-20 ENCOUNTER — Other Ambulatory Visit: Payer: Self-pay | Admitting: Internal Medicine

## 2014-01-21 DIAGNOSIS — H539 Unspecified visual disturbance: Secondary | ICD-10-CM | POA: Insufficient documentation

## 2014-01-21 NOTE — Assessment & Plan Note (Signed)
Mild elevation, likely reactive, o/w stable overall by history and exam, recent data reviewed with pt, and pt to continue medical treatment as before,  to f/u any worsening symptoms or concerns BP Readings from Last 3 Encounters:  01/14/14 142/90  08/22/13 110/72  04/10/13 120/80

## 2014-01-21 NOTE — Assessment & Plan Note (Signed)
Mild to mod, for antibx course,  to f/u any worsening symptoms or concerns 

## 2014-01-21 NOTE — Assessment & Plan Note (Signed)
Per pt, Pt plans to call asap for optho evaluation, declines referral, consider MRI r/o such as optic neuritis

## 2014-02-17 ENCOUNTER — Telehealth: Payer: Self-pay

## 2014-02-17 NOTE — Telephone Encounter (Signed)
LVM for pt to call back.    RE: FLU Shot for 2015/2016

## 2014-02-19 ENCOUNTER — Other Ambulatory Visit: Payer: Self-pay | Admitting: Internal Medicine

## 2014-03-09 ENCOUNTER — Telehealth: Payer: Self-pay | Admitting: *Deleted

## 2014-03-09 NOTE — Telephone Encounter (Signed)
[place] [facility] - Client TELEPHONE ADVICE RECORD [facility] Patient Name: MARCY SOOKDEO Gender: Female DOB: [birth year] Age: 59 Y 3 M 23 D Return Phone Number: [phone] (Primary) Address: [address] City/State/Zip: [place] [address] Client [place] [facility] [place] [facility] - Client Client Site [place] - Night Physician [place], [place] Type Call Call Type Triage / [place] Name s Relationship To Patient Self Return Phone Number [phone] (Primary) Chief Complaint Fever (non urgent symptom) (> THREE MONTHS) Initial Comment Caller states that she has a fever of 100, sinus infection, ear infection (states her normal temp is 95.7) PreDisposition Did not know what to do Nurse Assessment Nurse: Sherral Hammers, RN, Megan Date/Time (Eastern Time): 03/06/2014 6:02:16 PM Confirm and document reason for call. If symptomatic, describe symptoms. ---Caller states that she has a fever of 100, sinus infection, ear infection (states her normal temp is 95.7). Has the patient traveled out of the country within the last 30 days? ---No Does the patient require triage? ---Yes Related visit to physician within the last 2 weeks? ---No Does the PT have any chronic conditions? (i.e. diabetes, asthma, etc.) ---Yes List chronic conditions. ---ear/sinus infxn. Guidelines Guideline Title Affirmed Question Affirmed Notes Nurse Date/Time Eilene Ghazi Time) Sinus Pain or Congestion [1] Redness or swelling on the cheek, forehead or around the eye AND [2] fever Sherral Hammers, RN, Jinny Blossom 03/06/2014 6:04:01 PM Disp. Time Eilene Ghazi Time) Disposition Final User 03/06/2014 6:07:44 PM Go to ED Now (or PCP triage) Yes Sherral Hammers, RN, Delphia Grates Understands: Yes Disagree/Comply: Disagree PLEASE NOTE: All timestamps contained within this report are represented as [place] Standard Time. CONFIDENTIALTY NOTICE: This fax transmission is intended only for the  addressee. It contains information that is legally privileged, confidential or otherwise protected from use or disclosure. If you are not the intended recipient, you are strictly prohibited from reviewing, disclosing, copying using or disseminating any of this information or taking any action in reliance on or regarding this information. If you have received this fax in error, please notify us immediately by telephone so that we can arrange for its return to [place]. Phone: [phone], Toll-Free: [phone], Fax: [phone] Page: 2 of 2 Call Id: [id number] Disagree/Comply Reason: Wait and see Care Advice Given Per Guideline GO TO ED NOW (OR PCP TRIAGE): * IF NO PCP TRIAGE: You need to be seen. Go to the [facility] at [facility] within the next hour. Leave as soon as you can. DRIVING: Another adult should drive. BRING MEDICINES: * Please bring a list of your current medicines when you go to see the doctor. * It is also a good idea to bring the pill bottles too. This will help the doctor to make certain you are taking the right medicines and the right dose. After Care Instructions Given Call Event Type User Date / Time Description Comments User: Hillery Jacks, RN Date/Time Eilene Ghazi Time): 03/06/2014 6:08:50 PM Caller states she is going to wait and see how she feels. States she would really like to wait and be seen tomorrow at the office,

## 2014-03-10 ENCOUNTER — Other Ambulatory Visit: Payer: Self-pay

## 2014-03-10 MED ORDER — VENLAFAXINE HCL ER 150 MG PO CP24: 150.0000 mg | ORAL_CAPSULE | Freq: Every day | ORAL | Status: DC

## 2014-03-20 ENCOUNTER — Telehealth: Payer: Self-pay | Admitting: Internal Medicine

## 2014-03-20 ENCOUNTER — Ambulatory Visit: Payer: Medicare Other | Admitting: Internal Medicine

## 2014-03-20 MED ORDER — CEFUROXIME AXETIL 250 MG PO TABS
250.0000 mg | ORAL_TABLET | Freq: Two times a day (BID) | ORAL | Status: DC
Start: 2014-03-20 — End: 2014-07-08

## 2014-03-20 NOTE — Telephone Encounter (Signed)
Pt aware.

## 2014-03-20 NOTE — Telephone Encounter (Signed)
Left vm for pt to callback 

## 2014-03-20 NOTE — Telephone Encounter (Signed)
Done erx 

## 2014-03-20 NOTE — Telephone Encounter (Signed)
Pt is unable to come for an appt today, she had to take her son to the ED. Pt was wondering if she can get antibiotic for her earache. Please help

## 2014-03-21 ENCOUNTER — Other Ambulatory Visit: Payer: Self-pay | Admitting: Internal Medicine

## 2014-03-24 NOTE — Telephone Encounter (Signed)
Done hardcopy to Cherina  

## 2014-03-25 ENCOUNTER — Other Ambulatory Visit: Payer: Self-pay | Admitting: Internal Medicine

## 2014-03-25 NOTE — Telephone Encounter (Signed)
Left message that her RX was the front desk.

## 2014-06-27 ENCOUNTER — Other Ambulatory Visit: Payer: Self-pay | Admitting: Internal Medicine

## 2014-06-30 NOTE — Telephone Encounter (Signed)
Rx faxed to pharmacy  

## 2014-07-08 ENCOUNTER — Encounter: Payer: Self-pay | Admitting: Internal Medicine

## 2014-07-08 ENCOUNTER — Ambulatory Visit (INDEPENDENT_AMBULATORY_CARE_PROVIDER_SITE_OTHER): Payer: BLUE CROSS/BLUE SHIELD | Admitting: Internal Medicine

## 2014-07-08 VITALS — BP 122/78 | HR 76 | Temp 97.5°F

## 2014-07-08 DIAGNOSIS — J019 Acute sinusitis, unspecified: Secondary | ICD-10-CM | POA: Diagnosis not present

## 2014-07-08 DIAGNOSIS — J328 Other chronic sinusitis: Secondary | ICD-10-CM

## 2014-07-08 DIAGNOSIS — Z9049 Acquired absence of other specified parts of digestive tract: Secondary | ICD-10-CM

## 2014-07-08 DIAGNOSIS — Z0001 Encounter for general adult medical examination with abnormal findings: Secondary | ICD-10-CM | POA: Insufficient documentation

## 2014-07-08 DIAGNOSIS — Z0000 Encounter for general adult medical examination without abnormal findings: Secondary | ICD-10-CM | POA: Diagnosis not present

## 2014-07-08 MED ORDER — ZOLPIDEM TARTRATE 10 MG PO TABS: 10.0000 mg | ORAL_TABLET | Freq: Every evening | ORAL | Status: DC | PRN

## 2014-07-08 MED ORDER — CEFTRIAXONE SODIUM 1 G IJ SOLR
1.0000 g | Freq: Once | INTRAMUSCULAR | Status: AC
  Administered 2014-07-08: 1 g via INTRAMUSCULAR

## 2014-07-08 MED ORDER — CEFUROXIME AXETIL 250 MG PO TABS: 250.0000 mg | ORAL_TABLET | Freq: Two times a day (BID) | ORAL | Status: DC

## 2014-07-08 NOTE — Progress Notes (Signed)
Subjective:    Patient ID: [name], female    DOB: [birth year], 59 y.o.   MRN: [id number]  HPI  Here for wellness and f/u;  Overall doing ok;  Pt denies Chest pain, worsening SOB, DOE, wheezing, orthopnea, PND, worsening LE edema, palpitations, dizziness or syncope.  Pt denies neurological change such as new headache, facial or extremity weakness.  Pt denies polydipsia, polyuria, or low sugar symptoms. Pt states overall good compliance with treatment and medications, good tolerability, and has been trying to follow appropriate diet.  Pt denies worsening depressive symptoms, suicidal ideation or panic. No fever, night sweats, wt loss, loss of appetite, or other constitutional symptoms.  Pt states good ability with ADL's, has low fall risk, home safety reviewed and adequate, no other significant changes in hearing or vision, and only occasionally active with exercise.   Here also with 2-3 days acute onset fever, facial pain, pressure, headache, general weakness and malaise, and greenish d/c, with mild ST and cough.  Still with signficant difficulty sleeping , getting to sleep mostly, ambine works well.  Due for colonoscopy with Dr Dennie Fetters Past Medical History  Diagnosis Date  . Chronic sinus infection 04/12/2010  . Abdominal pain, epigastric 10/25/2007  . ADD 01/20/2009  . ALLERGIC RHINITIS 10/15/2006  . ANGIOEDEMA 08/12/2008  . ANXIETY 10/15/2006  . ASTHMATIC BRONCHITIS, ACUTE 01/27/2008  . Cellulitis and abscess of leg, except foot 08/12/2007  . CELLULITIS, [place] 12/21/2008  . CHEST PAIN 06/14/2007  . COLECTOMY, PARTIAL, WITH ANASTOMOSIS, HX OF 10/15/2006  . COLONIC POLYPS, HX OF 10/15/2006  . CYST, OVARIAN NEC/NOS 10/15/2006  . Dengue 02/22/2010  . DVT, HX OF 10/15/2006  . DYSAUTONOMIA 10/15/2006  . Dysfunction of eustachian tube 05/26/2009  . GI BLEEDING 04/04/2007  . Glossitis 03/06/2008  . HEADACHE, CHRONIC 04/04/2007  . Hematemesis 10/25/2007  . HEMORRHOIDS 04/04/2007  . HYPERLIPIDEMIA  12/12/2007  . HYPERTENSION 10/15/2006  . HYPOTHYROIDISM 03/06/2008  . IBS 04/04/2007  . MASTECTOMY, BILATERAL, HX OF 08/12/2009  . NECK MASS 07/01/2007  . NEOP, MALIGNANT, FEMALE BREAST NOS 10/15/2006  . 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 SYNCOPE 07/01/2007  . Wheezing 08/20/2008  . WOUND, OPEN, LEG, WITHOUT COMPLICATION 8/56/3149   Past Surgical History  Procedure Laterality Date  . Abdominal hysterectomy    . Oophorectomy    . Cholecystectomy    . Spigellian hernia    . Multiple ablations    . Bilateral mastectomy/reconstruction    . Cesarean section    . Left and right heart catheterization with coronary angiogram N/A 10/07/2012    Procedure: LEFT AND RIGHT HEART CATHETERIZATION WITH CORONARY ANGIOGRAM;  Surgeon: Jolaine Artist, MD;  Location: [facility] CATH LAB;  Service: Cardiovascular;  Laterality: N/A;    reports that she has never smoked. She does not have any smokeless tobacco history on file. She reports that she does not drink alcohol or use illicit drugs. family history includes Cancer in her sister. Allergies  Allergen Reactions  . Fish Allergy Swelling  . Latex     More than a day causes blisters  . Penicillins     REACTION: shock   Current Outpatient Prescriptions on File Prior to Visit  Medication Sig Dispense Refill  . acetaminophen-codeine (TYLENOL #3) 300-30 MG per tablet TAKE ONE TABLET FOUR TIMES DAILY AS NEEDED FOR PAIN 120 tablet 2  . diltiazem (CARDIZEM CD) 120 MG 24 hr capsule Take 1 capsule (120 mg total) by mouth daily. 30 capsule 11  . fluticasone (FLONASE)  50 MCG/ACT nasal spray Place 2 sprays into both nostrils daily. 16 g 5  . levothyroxine (SYNTHROID, LEVOTHROID) 50 MCG tablet Take 1 tablet (50 mcg total) by mouth daily before breakfast. 90 tablet 3  . propranolol ER (INDERAL LA) 120 MG 24 hr capsule TAKE TWO CAPSULES EVERY DAY AS DIRECTED 60 capsule 3  . venlafaxine XR (EFFEXOR-XR) 150 MG 24 hr capsule Take 1 capsule (150 mg total) by mouth daily. 30  capsule 11   No current facility-administered medications on file prior to visit.     Review of Systems Constitutional: Negative for increased diaphoresis, other activity, appetite or siginficant weight change other than noted HENT: Negative for worsening hearing loss, ear pain, facial swelling, mouth sores and neck stiffness.   Eyes: Negative for other worsening pain, redness or visual disturbance.  Respiratory: Negative for shortness of breath and wheezing  Cardiovascular: Negative for chest pain and palpitations.  Gastrointestinal: Negative for diarrhea, blood in stool, abdominal distention or other pain Genitourinary: Negative for hematuria, flank pain or change in urine volume.  Musculoskeletal: Negative for myalgias or other joint complaints.  Skin: Negative for color change and wound or drainage.  Neurological: Negative for syncope and numbness. other than noted Hematological: Negative for adenopathy. or other swelling Psychiatric/Behavioral: Negative for hallucinations, SI, self-injury, decreased concentration or other worsening agitation.      Objective:   Physical Exam BP 122/78 mmHg  Pulse 76  Temp(Src) 97.5 F (36.4 C) (Oral)  SpO2 98% VS noted, mild ill Constitutional: Pt is oriented to person, place, and time. Appears well-developed and well-nourished, in no significant distress Head: Normocephalic and atraumatic.  Right Ear: External ear normal.  Left Ear: External ear normal.  Nose: Nose normal.  Mouth/Throat: Oropharynx is clear and moist.  Bilat tm's with mild erythema.  Max sinus areas mild tender.  Pharynx with mild erythema, no exudate Eyes: Conjunctivae and EOM are normal. Pupils are equal, round, and reactive to light.  Neck: Normal range of motion. Neck supple. No JVD present. No tracheal deviation present or significant neck LA or mass Cardiovascular: Normal rate, regular rhythm, normal heart sounds and intact distal pulses.   Pulmonary/Chest: Effort  normal and breath sounds without rales or wheezing  Abdominal: Soft. Bowel sounds are normal. NT. No HSM  Musculoskeletal: Normal range of motion. Exhibits no edema.  Lymphadenopathy:  Has no cervical adenopathy.  Neurological: Pt is alert and oriented to person, place, and time. Pt has normal reflexes. No cranial nerve deficit. Motor grossly intact Skin: Skin is warm and dry. No rash noted.  Psychiatric:  Has normal mood and affect. Behavior is normal.     Assessment & Plan:

## 2014-07-08 NOTE — Progress Notes (Signed)
Pre visit review using our clinic review tool, if applicable. No additional management support is needed unless otherwise documented below in the visit note. 

## 2014-07-08 NOTE — Patient Instructions (Signed)
You had the antibiotic shot today  Please take all new medication as prescribed - the ceftin  Please continue all other medications as before, and refills have been done if requested.  Please have the pharmacy call with any other refills you may need.  Please continue your efforts at being more active, low cholesterol diet, and weight control.  Please keep your appointments with your specialists as you may have planned  You will be contacted regarding the referral for: Dr Dennie Fetters  Please go to the LAB in the Basement (turn left off the elevator) for the tests to be done today  You will be contacted by phone if any changes need to be made immediately.  Otherwise, you will receive a letter about your results with an explanation, but please check with MyChart first.  Please remember to sign up for MyChart if you have not done so, as this will be important to you in the future with finding out test results, communicating by private email, and scheduling acute appointments online when needed.  Please return in 1 year for your yearly visit, or sooner if needed

## 2014-07-09 NOTE — Assessment & Plan Note (Addendum)
With flare - Mild to mod, difficult to clear in past, for rocephin IM then antibx course,  to f/u any worsening symptoms or concerns

## 2014-07-09 NOTE — Assessment & Plan Note (Signed)

## 2014-08-12 ENCOUNTER — Telehealth: Payer: Self-pay | Admitting: Geriatric Medicine

## 2014-08-12 NOTE — Telephone Encounter (Signed)
Patient has had double mastectomy. She does not need a mammogram.

## 2014-08-18 ENCOUNTER — Ambulatory Visit: Payer: BLUE CROSS/BLUE SHIELD | Admitting: Internal Medicine

## 2014-08-21 ENCOUNTER — Other Ambulatory Visit: Payer: Self-pay | Admitting: Internal Medicine

## 2014-08-26 ENCOUNTER — Ambulatory Visit (INDEPENDENT_AMBULATORY_CARE_PROVIDER_SITE_OTHER): Payer: BLUE CROSS/BLUE SHIELD | Admitting: Internal Medicine

## 2014-08-26 DIAGNOSIS — I10 Essential (primary) hypertension: Secondary | ICD-10-CM

## 2014-08-26 DIAGNOSIS — R55 Syncope and collapse: Secondary | ICD-10-CM | POA: Diagnosis not present

## 2014-08-26 DIAGNOSIS — J069 Acute upper respiratory infection, unspecified: Secondary | ICD-10-CM

## 2014-08-26 MED ORDER — CEFUROXIME AXETIL 250 MG PO TABS: 250.0000 mg | ORAL_TABLET | Freq: Two times a day (BID) | ORAL | Status: DC

## 2014-08-26 NOTE — Assessment & Plan Note (Signed)
ECG reviewed as per emr

## 2014-08-26 NOTE — Assessment & Plan Note (Addendum)
Unclear etiology, has hx in past 2014, ECG reviewed as per emr, will re-eval with f/u echo, carotids, head MRI, and refer Dr Klein/card   Note:  Total time for pt hx, exam, review of record with pt in the room, determination of diagnoses and plan for further eval and tx is > 40 min, with over 50% spent in coordination and counseling of patient

## 2014-08-26 NOTE — Assessment & Plan Note (Signed)
Mild to mod, for antibx course,  to f/u any worsening symptoms or concerns 

## 2014-08-26 NOTE — Patient Instructions (Signed)
Your EKG was OK today  Please take all new medication as prescribed - the antibiotic  Please continue all other medications as before, and refills have been done if requested.  Please have the pharmacy call with any other refills you may need.  Please continue your efforts at being more active, low cholesterol diet, and weight control..  Please keep your appointments with your specialists as you may have planned  You will be contacted regarding the referral for: MRI head, carotid dopplers, echocardiogram, and Dr Klein/cardiology

## 2014-08-26 NOTE — Progress Notes (Signed)
Subjective:    Patient ID: [name], female    DOB: April 02, 1955, 59 y.o.   MRN: [id number]  HPI   Here with 2 wks acute onset fever, right ear pain and pressure, headache, general weakness and malaise, with mild ST, no cough, but pt denies chest pain, wheezing, increased sob or doe, orthopnea, PND, increased LE swelling, palpitations.  Has been seen at [facility] 8 days ago, tx with zpack but no improvement in symptoms per pt.  Also had a syncopal episode 3 days ago with bending to change the dog linens on the floor, out for several minutes, no injury, then in the next 2 days has had 4 episodes severe lightheaded but no further syncope, one with walking uphill, another with bending to take out of the dishwasher.  Past Medical History  Diagnosis Date  . Chronic sinus infection 04/12/2010  . Abdominal pain, epigastric 10/25/2007  . ADD 01/20/2009  . ALLERGIC RHINITIS 10/15/2006  . ANGIOEDEMA 08/12/2008  . ANXIETY 10/15/2006  . ASTHMATIC BRONCHITIS, ACUTE 01/27/2008  . Cellulitis and abscess of leg, except foot 08/12/2007  . CELLULITIS, [place] 12/21/2008  . CHEST PAIN 06/14/2007  . COLECTOMY, PARTIAL, WITH ANASTOMOSIS, HX OF 10/15/2006  . COLONIC POLYPS, HX OF 10/15/2006  . CYST, OVARIAN NEC/NOS 10/15/2006  . Dengue 02/22/2010  . DVT, HX OF 10/15/2006  . DYSAUTONOMIA 10/15/2006  . Dysfunction of eustachian tube 05/26/2009  . GI BLEEDING 04/04/2007  . Glossitis 03/06/2008  . HEADACHE, CHRONIC 04/04/2007  . Hematemesis 10/25/2007  . HEMORRHOIDS 04/04/2007  . HYPERLIPIDEMIA 12/12/2007  . HYPERTENSION 10/15/2006  . HYPOTHYROIDISM 03/06/2008  . IBS 04/04/2007  . MASTECTOMY, BILATERAL, HX OF 08/12/2009  . NECK MASS 07/01/2007  . NEOP, MALIGNANT, FEMALE BREAST NOS 10/15/2006  . 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 SYNCOPE 07/01/2007  . Wheezing 08/20/2008  . WOUND, OPEN, LEG, WITHOUT COMPLICATION 7/40/8144   Past Surgical History  Procedure Laterality Date  . Abdominal hysterectomy    . Oophorectomy    .  Cholecystectomy    . Spigellian hernia    . Multiple ablations    . Bilateral mastectomy/reconstruction    . Cesarean section    . Left and right heart catheterization with coronary angiogram N/A 10/07/2012    Procedure: LEFT AND RIGHT HEART CATHETERIZATION WITH CORONARY ANGIOGRAM;  Surgeon: Jolaine Artist, MD;  Location: [facility] CATH LAB;  Service: Cardiovascular;  Laterality: N/A;    reports that she has never smoked. She does not have any smokeless tobacco history on file. She reports that she does not drink alcohol or use illicit drugs. family history includes Cancer in her sister. Allergies  Allergen Reactions  . Fish Allergy Swelling  . Latex     More than a day causes blisters  . Penicillins     REACTION: shock   Current Outpatient Prescriptions on File Prior to Visit  Medication Sig Dispense Refill  . acetaminophen-codeine (TYLENOL #3) 300-30 MG per tablet TAKE ONE TABLET FOUR TIMES DAILY AS NEEDED FOR PAIN 120 tablet 2  . diltiazem (CARDIZEM CD) 120 MG 24 hr capsule Take 1 capsule (120 mg total) by mouth daily. 30 capsule 11  . fluticasone (FLONASE) 50 MCG/ACT nasal spray Place 2 sprays into both nostrils daily. 16 g 5  . levothyroxine (SYNTHROID, LEVOTHROID) 50 MCG tablet Take 1 tablet (50 mcg total) by mouth daily before breakfast. 90 tablet 3  . propranolol ER (INDERAL LA) 120 MG 24 hr capsule Take 2 capsules by mouth everyday as directed  30 capsule 0  . venlafaxine XR (EFFEXOR-XR) 150 MG 24 hr capsule Take 1 capsule (150 mg total) by mouth daily. 30 capsule 11  . zolpidem (AMBIEN) 10 MG tablet Take 1 tablet (10 mg total) by mouth at bedtime as needed. 30 tablet 5   No current facility-administered medications on file prior to visit.   Review of Systems  Constitutional: Negative for unusual diaphoresis or night sweats HENT: Negative for ringing in ear or discharge Eyes: Negative for double vision or worsening visual disturbance.  Respiratory: Negative for choking and  stridor.   Gastrointestinal: Negative for vomiting or other signifcant bowel change Genitourinary: Negative for hematuria or change in urine volume.  Musculoskeletal: Negative for other MSK pain or swelling Skin: Negative for color change and worsening wound.  Neurological: Negative for tremors and numbness other than noted  Psychiatric/Behavioral: Negative for decreased concentration or agitation other than above       Objective:   Physical Exam There were no vitals taken for this visit. VS noted,  Constitutional: Pt appears in no significant distress HENT: Head: NCAT.  Right Ear: External ear normal.  Left Ear: External ear normal.  Eyes: . Pupils are equal, round, and reactive to light. Conjunctivae and EOM are normal Bilat tm's with mild erythema.  Max sinus areas mild tender.  Pharynx with mild erythema, no exudate Neck: Normal range of motion. Neck supple.  Cardiovascular: Normal rate and regular rhythm.   Pulmonary/Chest: Effort normal and breath sounds without rales or wheezing.  Abd:  Soft, NT, ND, + BS Neurological: Pt is alert. Not confused , motor grossly intact Skin: Skin is warm. No rash, no LE edema Psychiatric: Pt behavior is normal. No agitation.     Assessment & Plan:

## 2014-08-26 NOTE — Assessment & Plan Note (Signed)
stable overall by history and exam, recent data reviewed with pt, and pt to continue medical treatment as before,  to f/u any worsening symptoms or concerns BP Readings from Last 3 Encounters:  07/08/14 122/78  01/14/14 142/90  08/22/13 110/72

## 2014-08-27 ENCOUNTER — Other Ambulatory Visit: Payer: Self-pay | Admitting: Internal Medicine

## 2014-09-03 ENCOUNTER — Other Ambulatory Visit (HOSPITAL_COMMUNITY): Payer: BLUE CROSS/BLUE SHIELD

## 2014-09-04 ENCOUNTER — Other Ambulatory Visit: Payer: Self-pay

## 2014-09-04 ENCOUNTER — Ambulatory Visit (HOSPITAL_COMMUNITY): Payer: BLUE CROSS/BLUE SHIELD | Attending: Cardiology

## 2014-09-04 DIAGNOSIS — I10 Essential (primary) hypertension: Secondary | ICD-10-CM | POA: Insufficient documentation

## 2014-09-04 DIAGNOSIS — E785 Hyperlipidemia, unspecified: Secondary | ICD-10-CM | POA: Diagnosis not present

## 2014-09-04 DIAGNOSIS — R55 Syncope and collapse: Secondary | ICD-10-CM | POA: Diagnosis not present

## 2014-09-04 DIAGNOSIS — I351 Nonrheumatic aortic (valve) insufficiency: Secondary | ICD-10-CM | POA: Insufficient documentation

## 2014-09-04 DIAGNOSIS — I517 Cardiomegaly: Secondary | ICD-10-CM | POA: Diagnosis not present

## 2014-09-04 DIAGNOSIS — I313 Pericardial effusion (noninflammatory): Secondary | ICD-10-CM | POA: Diagnosis not present

## 2014-09-15 ENCOUNTER — Encounter: Payer: Self-pay | Admitting: Internal Medicine

## 2014-09-15 ENCOUNTER — Ambulatory Visit (INDEPENDENT_AMBULATORY_CARE_PROVIDER_SITE_OTHER): Payer: BLUE CROSS/BLUE SHIELD | Admitting: Internal Medicine

## 2014-09-15 VITALS — BP 124/76 | HR 76 | Ht 69.0 in

## 2014-09-15 DIAGNOSIS — I421 Obstructive hypertrophic cardiomyopathy: Secondary | ICD-10-CM | POA: Diagnosis not present

## 2014-09-15 DIAGNOSIS — R55 Syncope and collapse: Secondary | ICD-10-CM

## 2014-09-15 NOTE — Progress Notes (Signed)
Patient Care Team: Biagio Borg, MD as PCP - General   HPI  [name] is a 59 y.o. female Seen in followup for syncope recurring following a long hiatus in the context of previously diagnosed dysautonomia  She aslo has CP and SoB associated with a interval diagnosis of HCM.  She underwent catheterization which demonstrated no obstruction. Cardiopulmonary stress testing demonstrated ST segment depression suggestive of subendocardial ischemia as a possible mechanism. She saw Dr. Elliot Cousin last week and hr on verapamil. She saw dr Farrel Conners at [facility], these records were reviewed and for a time she wasa in a research trial until she no showed  This was for ranolazine   Her syncopal episodes were unfortunately familiar depsite the hiatus   They occurred in the ccontext of GI illness and ambient heat exposure  Past Medical History  Diagnosis Date  . Chronic sinus infection 04/12/2010  . Abdominal pain, epigastric 10/25/2007  . ADD 01/20/2009  . ALLERGIC RHINITIS 10/15/2006  . ANGIOEDEMA 08/12/2008  . ANXIETY 10/15/2006  . ASTHMATIC BRONCHITIS, ACUTE 01/27/2008  . Cellulitis and abscess of leg, except foot 08/12/2007  . CELLULITIS, [place] 12/21/2008  . CHEST PAIN 06/14/2007  . COLECTOMY, PARTIAL, WITH ANASTOMOSIS, HX OF 10/15/2006  . COLONIC POLYPS, HX OF 10/15/2006  . CYST, OVARIAN NEC/NOS 10/15/2006  . Dengue 02/22/2010  . DVT, HX OF 10/15/2006  . DYSAUTONOMIA 10/15/2006  . Dysfunction of eustachian tube 05/26/2009  . GI BLEEDING 04/04/2007  . Glossitis 03/06/2008  . HEADACHE, CHRONIC 04/04/2007  . Hematemesis 10/25/2007  . HEMORRHOIDS 04/04/2007  . HYPERLIPIDEMIA 12/12/2007  . HYPERTENSION 10/15/2006  . HYPOTHYROIDISM 03/06/2008  . IBS 04/04/2007  . MASTECTOMY, BILATERAL, HX OF 08/12/2009  . NECK MASS 07/01/2007  . NEOP, MALIGNANT, FEMALE BREAST NOS 10/15/2006  . 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 SYNCOPE 07/01/2007  . Wheezing 08/20/2008  . WOUND, OPEN, LEG, WITHOUT COMPLICATION 07/08/2374     Past Surgical History  Procedure Laterality Date  . Abdominal hysterectomy    . Oophorectomy    . Cholecystectomy    . Spigellian hernia    . Multiple ablations    . Bilateral mastectomy/reconstruction    . Cesarean section    . Left and right heart catheterization with coronary angiogram N/A 10/07/2012    Procedure: LEFT AND RIGHT HEART CATHETERIZATION WITH CORONARY ANGIOGRAM;  Surgeon: Jolaine Artist, MD;  Location: [facility] CATH LAB;  Service: Cardiovascular;  Laterality: N/A;    Current Outpatient Prescriptions  Medication Sig Dispense Refill  . acetaminophen-codeine (TYLENOL #3) 300-30 MG per tablet TAKE ONE TABLET BY MOUTH FOUR TIMES DAILY AS NEEDED FOR PAIN    . cefUROXime (CEFTIN) 250 MG tablet Take 1 tablet (250 mg total) by mouth 2 (two) times daily. 20 tablet 1  . levothyroxine (SYNTHROID, LEVOTHROID) 50 MCG tablet Take 50 mcg by mouth daily before breakfast.    . propranolol ER (INDERAL LA) 120 MG 24 hr capsule Take 2 capsules by mouth everyday as directed 30 capsule 0  . venlafaxine XR (EFFEXOR-XR) 150 MG 24 hr capsule Take 1 capsule (150 mg total) by mouth daily. 30 capsule 11  . zolpidem (AMBIEN) 10 MG tablet Take 10 mg by mouth at bedtime as needed for sleep.     No current facility-administered medications for this visit.    Allergies  Allergen Reactions  . Fish Allergy Swelling  . Latex Dermatitis    More than a day causes blisters   . Penicillins  REACTION: shock    Review of Systems negative except from HPI and PMH  Physical Exam BP 124/76 mmHg  Pulse 76  Ht 5\' 9"  (1.753 m) Well developed and well nourished in no acute distress HENT normal E scleral and icterus clear Neck Supple JVP flat; carotids brisk and full Clear to ausculation  *Regular rate and rhythm, 2/6 murmur notable with Valsalva that r decreases with release no gallops or rub Soft with active bowel sounds No clubbing cyanosis 1+ Edema Alert and oriented, grossly normal motor and  sensory function Skin Warm and Dry    Assessment and  Plan  Dysautonomic Syncope  HCM  SOB   Her HCM remains an issue and symptomatic  I did not ask her the status of the followup wth [facility] and the ranolazine  We reviewed the physiology of her syncope--she recalls a great deal  There remains a great deal of psychosocial stress related to their son, Lovena Le, whom we have also been seeing and who is depressed, diabetic and dysautonomic  We spent more than 50% of our >45 min visit in face to face counseling regarding the above'

## 2014-09-18 ENCOUNTER — Inpatient Hospital Stay: Admission: RE | Admit: 2014-09-18 | Payer: BLUE CROSS/BLUE SHIELD | Source: Ambulatory Visit

## 2014-09-24 ENCOUNTER — Other Ambulatory Visit: Payer: Self-pay | Admitting: Internal Medicine

## 2014-09-24 NOTE — Telephone Encounter (Signed)
Done hardcopy to Dahlia  

## 2014-09-25 NOTE — Telephone Encounter (Signed)
Rx faxed to pharmacy  

## 2014-10-16 ENCOUNTER — Other Ambulatory Visit: Payer: Self-pay | Admitting: Internal Medicine

## 2014-10-16 MED ORDER — CEFUROXIME AXETIL 250 MG PO TABS: 250.0000 mg | ORAL_TABLET | Freq: Two times a day (BID) | ORAL | Status: DC

## 2014-10-20 ENCOUNTER — Other Ambulatory Visit: Payer: Self-pay | Admitting: Internal Medicine

## 2014-10-26 ENCOUNTER — Telehealth: Payer: Self-pay | Admitting: Internal Medicine

## 2014-10-26 ENCOUNTER — Encounter: Payer: Self-pay | Admitting: Internal Medicine

## 2014-10-26 NOTE — Telephone Encounter (Signed)
New Message  Pt c/o Shortness Of Breath: STAT if SOB developed within the last 24 hours or pt is noticeably SOB on the phone  1. Are you currently SOB (can you hear that pt is SOB on the phone)? No.  2. How long have you been experiencing SOB? Since Friday 10/23/2014 3. Are you SOB when sitting or when up moving around? Moving around walking  4.  Are you currently experiencing any other symptoms? Oxygen level in the hospital was 89. BP which was never high is now high. Swelling on the left side of her neck that it irritating her and she has no idea what it is. She says it looks like it is the size of the "top of a grapefruit" and it feels like she is choking. Pt states that she was recently seen in the hospital. Was sent home for bed rest. Pleas call back to discuss.

## 2014-10-26 NOTE — Telephone Encounter (Signed)
Reviewed the patient's symptoms with Dr. Angelena Form (DOD). Per Dr. Angelena Form- he is not convinced the patient's symptoms are cardiac related. SOB would be only side effect of HCM, but no unilateral neck swelling. He recommends that the patient follow up with her PCP to start. I have called and notified the patient of this. She is agreeable with calling Dr. Gwynn Burly office for evaluation. I have advised her is she is seen by primary care and they absolutely feel her symptoms are cardiac related, then we will try to work her in on Wednesday with Dr. Caryl Comes when her son comes in for follow up.

## 2014-10-26 NOTE — Progress Notes (Signed)
Late entry- Dr. Caryl Comes sent a message to Dr. Mina Marble at [facility] regarding the patient's follow up for ranolazine research study. Per Dr. Caryl Comes, he did hear back from Dr. Mina Marble and no follow up at [facility] has been specified.

## 2014-10-26 NOTE — Telephone Encounter (Signed)
I called and spoke with the patient.  She reports that on Thursday, she was walking around [place] in [place], and developed SOB while shopping. She started to have profuse sweating with some chest pain. She had minimal swelling to her neck Thursday night. On Friday, she had noticeable swelling to the left side of her neck around a little past the collarbone area.  She lives 1 & 1/2 hours away and went to [facility] on Friday for evaluation. She continued to feel SOB- O2 sat was 89 % and SBP was 190. She was offered admission, but declined as she has a son that she has to take care of. It is hard for her to sleep. She feels ok today with her SOB of breath at rest, but did feel extremely SOB walking to the car. I advised her I am uncertain of the cause of her unilateral neck swelling. She states she contacted Dr. Gwynn Burly office and they want her to see cardiology. I advised I would review with the DOD and call her back.  She is agreeable.

## 2014-12-18 ENCOUNTER — Other Ambulatory Visit: Payer: Self-pay | Admitting: Internal Medicine

## 2014-12-18 NOTE — Telephone Encounter (Signed)
Faxed script back to [facility]...[name]

## 2014-12-18 NOTE — Telephone Encounter (Signed)
MD out of office pls advise on refill.../lmb 

## 2015-01-03 HISTORY — PX: MITRAL VALVE REPAIR: SHX2039

## 2015-01-16 ENCOUNTER — Other Ambulatory Visit: Payer: Self-pay | Admitting: Internal Medicine

## 2015-01-18 NOTE — Telephone Encounter (Signed)
Ok to refill 

## 2015-01-18 NOTE — Telephone Encounter (Signed)
Refill done  But ok for heather to contact pt  - the tylenol #3 was meant to be only for occasional use  We may need to refer back to GI if it appears that she continues to have pain or diarrhea that requires this, for further evaluation, thanks

## 2015-01-18 NOTE — Telephone Encounter (Signed)
pleasea advise, thanks

## 2015-01-18 NOTE — Telephone Encounter (Signed)
Done hardcopy to heather cox

## 2015-02-04 DIAGNOSIS — I421 Obstructive hypertrophic cardiomyopathy: Secondary | ICD-10-CM | POA: Diagnosis not present

## 2015-02-04 DIAGNOSIS — I10 Essential (primary) hypertension: Secondary | ICD-10-CM | POA: Diagnosis not present

## 2015-02-04 DIAGNOSIS — R42 Dizziness and giddiness: Secondary | ICD-10-CM | POA: Diagnosis not present

## 2015-02-04 DIAGNOSIS — I422 Other hypertrophic cardiomyopathy: Secondary | ICD-10-CM | POA: Diagnosis not present

## 2015-02-04 DIAGNOSIS — Z87898 Personal history of other specified conditions: Secondary | ICD-10-CM | POA: Diagnosis not present

## 2015-02-04 DIAGNOSIS — R5383 Other fatigue: Secondary | ICD-10-CM | POA: Diagnosis not present

## 2015-02-04 DIAGNOSIS — I471 Supraventricular tachycardia: Secondary | ICD-10-CM | POA: Diagnosis not present

## 2015-02-12 ENCOUNTER — Other Ambulatory Visit: Payer: Self-pay | Admitting: Internal Medicine

## 2015-02-12 NOTE — Telephone Encounter (Signed)
Done hardcopy to Corinne  

## 2015-02-15 NOTE — Telephone Encounter (Signed)
rx faxed to [facility]

## 2015-02-27 DIAGNOSIS — Z87898 Personal history of other specified conditions: Secondary | ICD-10-CM | POA: Diagnosis not present

## 2015-03-05 ENCOUNTER — Other Ambulatory Visit: Payer: Self-pay | Admitting: Internal Medicine

## 2015-03-08 DIAGNOSIS — I422 Other hypertrophic cardiomyopathy: Secondary | ICD-10-CM | POA: Diagnosis not present

## 2015-03-11 ENCOUNTER — Ambulatory Visit (INDEPENDENT_AMBULATORY_CARE_PROVIDER_SITE_OTHER): Payer: BLUE CROSS/BLUE SHIELD | Admitting: Internal Medicine

## 2015-03-11 ENCOUNTER — Encounter: Payer: Self-pay | Admitting: Internal Medicine

## 2015-03-11 VITALS — BP 128/70 | HR 67 | Temp 97.7°F

## 2015-03-11 DIAGNOSIS — F329 Major depressive disorder, single episode, unspecified: Secondary | ICD-10-CM | POA: Diagnosis not present

## 2015-03-11 DIAGNOSIS — I422 Other hypertrophic cardiomyopathy: Secondary | ICD-10-CM

## 2015-03-11 DIAGNOSIS — F32A Depression, unspecified: Secondary | ICD-10-CM

## 2015-03-11 DIAGNOSIS — J069 Acute upper respiratory infection, unspecified: Secondary | ICD-10-CM

## 2015-03-11 DIAGNOSIS — I10 Essential (primary) hypertension: Secondary | ICD-10-CM | POA: Diagnosis not present

## 2015-03-11 MED ORDER — CEFUROXIME AXETIL 250 MG PO TABS: 250.0000 mg | ORAL_TABLET | Freq: Two times a day (BID) | ORAL | Status: DC

## 2015-03-11 MED ORDER — ESCITALOPRAM OXALATE 10 MG PO TABS: 10.0000 mg | ORAL_TABLET | Freq: Every day | ORAL | Status: DC

## 2015-03-11 MED ORDER — VENLAFAXINE HCL ER 75 MG PO CP24: 75.0000 mg | ORAL_CAPSULE | Freq: Every day | ORAL | Status: DC

## 2015-03-11 NOTE — Progress Notes (Signed)
Subjective:    Patient ID: [name], female    DOB: April 08, 1955, 60 y.o.   MRN: [id number]  HPI  Here with acute visit -  Here with 2 wks acute onset fever, facial and left ear pain, pressure, headache, general weakness and malaise, and greenish d/c, with mild ST and cough, but pt denies chest pain, wheezing, increased sob or doe, orthopnea, PND, increased LE swelling, palpitations, dizziness or syncope. Pt denies new neurological symptoms such as new headache, or facial or extremity weakness or numbness   Pt denies polydipsia, polyuria, or low sugar symptoms such as weakness or confusion improved with po intake.  Pt states overall good compliance with meds, trying to follow lower cholesterol, diabetic diet, wt overall stable but little exercise however.    Seen at [facility] related clinic recently per cardioilogy, mentioned ideally she would be weaned from the effexor in favor of SSRI given the HCM.   Past Medical History  Diagnosis Date  . Chronic sinus infection 04/12/2010  . Abdominal pain, epigastric 10/25/2007  . ADD 01/20/2009  . ALLERGIC RHINITIS 10/15/2006  . ANGIOEDEMA 08/12/2008  . ANXIETY 10/15/2006  . ASTHMATIC BRONCHITIS, ACUTE 01/27/2008  . Cellulitis and abscess of leg, except foot 08/12/2007  . CELLULITIS, [place] 12/21/2008  . CHEST PAIN 06/14/2007  . COLECTOMY, PARTIAL, WITH ANASTOMOSIS, HX OF 10/15/2006  . COLONIC POLYPS, HX OF 10/15/2006  . CYST, OVARIAN NEC/NOS 10/15/2006  . Dengue 02/22/2010  . DVT, HX OF 10/15/2006  . DYSAUTONOMIA 10/15/2006  . Dysfunction of eustachian tube 05/26/2009  . GI BLEEDING 04/04/2007  . Glossitis 03/06/2008  . HEADACHE, CHRONIC 04/04/2007  . Hematemesis 10/25/2007  . HEMORRHOIDS 04/04/2007  . HYPERLIPIDEMIA 12/12/2007  . HYPERTENSION 10/15/2006  . HYPOTHYROIDISM 03/06/2008  . IBS 04/04/2007  . MASTECTOMY, BILATERAL, HX OF 08/12/2009  . NECK MASS 07/01/2007  . NEOP, MALIGNANT, FEMALE BREAST NOS 10/15/2006  . 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  SYNCOPE 07/01/2007  . Wheezing 08/20/2008  . WOUND, OPEN, LEG, WITHOUT COMPLICATION [id number]   Past Surgical History  Procedure Laterality Date  . Abdominal hysterectomy    . Oophorectomy    . Cholecystectomy    . Spigellian hernia    . Multiple ablations    . Bilateral mastectomy/reconstruction    . Cesarean section    . Left and right heart catheterization with coronary angiogram N/A 10/07/2012    Procedure: LEFT AND RIGHT HEART CATHETERIZATION WITH CORONARY ANGIOGRAM;  Surgeon: Jolaine Artist, MD;  Location: [facility] CATH LAB;  Service: Cardiovascular;  Laterality: N/A;    reports that she has never smoked. She does not have any smokeless tobacco history on file. She reports that she does not drink alcohol or use illicit drugs. family history includes Cancer in her sister. Allergies  Allergen Reactions  . Fish Allergy Swelling  . Latex Dermatitis    More than a day causes blisters   . Penicillins     REACTION: shock   Current Outpatient Prescriptions on File Prior to Visit  Medication Sig Dispense Refill  . acetaminophen-codeine (TYLENOL #3) 300-30 MG tablet TAKE 1 TABLET FOUR TIMES DAILY AS NEEDED FOR PAIN 120 tablet 0  . levothyroxine (SYNTHROID, LEVOTHROID) 50 MCG tablet Take 50 mcg by mouth daily before breakfast.    . propranolol ER (INDERAL LA) 120 MG 24 hr capsule TAKE 2 CAPSULES DAILY AS DIRECTED 60 capsule 2  . zolpidem (AMBIEN) 10 MG tablet TAKE 1 TABLET BY MOUTH EVERY DAY AT BEDTIME AS NEEDED 90  tablet 1   No current facility-administered medications on file prior to visit.    Review of Systems  Constitutional: Negative for unusual diaphoresis or night sweats HENT: Negative for ringing in ear or discharge Eyes: Negative for double vision or worsening visual disturbance.  Respiratory: Negative for choking and stridor.   Gastrointestinal: Negative for vomiting or other signifcant bowel change Genitourinary: Negative for hematuria or change in urine volume.    Musculoskeletal: Negative for other MSK pain or swelling Skin: Negative for color change and worsening wound.  Neurological: Negative for tremors and numbness other than noted  Psychiatric/Behavioral: Negative for decreased concentration or agitation other than above       Objective:   Physical Exam BP 128/70 mmHg  Pulse 67  Temp(Src) 97.7 F (36.5 C) (Oral)  SpO2 95% VS noted, mild ill Constitutional: Pt appears in no significant distress HENT: Head: NCAT.  Right Ear: External ear normal.  Left Ear: External ear normal.  Bilat tm's with mild erythema, left >> right.  Max sinus areas mild tender.  Pharynx with mild erythema, no exudate Eyes: . Pupils are equal, round, and reactive to light. Conjunctivae and EOM are normal Neck: Normal range of motion. Neck supple.  Cardiovascular: Normal rate and regular rhythm.   Pulmonary/Chest: Effort normal and breath sounds without rales or wheezing.  Neurological: Pt is alert. Not confused , motor grossly intact Skin: Skin is warm. No rash, no LE edema Psychiatric: Pt behavior is normal. No agitation.      Assessment & Plan:

## 2015-03-11 NOTE — Patient Instructions (Signed)
Please take all new medication as prescribed - the antibiotic  OK to decrease the effexor to 75 mg for 3 weeks, then stop  Please take all new medication as prescribed- the lexapro 10 mg per day after stopping the effexor  Please continue all other medications as before, and refills have been done if requested.  Please have the pharmacy call with any other refills you may need.  Please keep your appointments with your specialists as you may have planned

## 2015-03-12 ENCOUNTER — Ambulatory Visit: Payer: BLUE CROSS/BLUE SHIELD | Admitting: Internal Medicine

## 2015-03-13 NOTE — Assessment & Plan Note (Signed)
Mild to mod, for antibx course,  to f/u any worsening symptoms or concerns 

## 2015-03-13 NOTE — Assessment & Plan Note (Signed)
stable overall by history and exam, recent data reviewed with pt, and pt to continue medical treatment as before,  to f/u any worsening symptoms or concerns BP Readings from Last 3 Encounters:  03/11/15 128/70  09/15/14 124/76  07/08/14 122/78

## 2015-03-13 NOTE — Assessment & Plan Note (Signed)
stable overall by history and exam, to cont f/u cardiology, and pt to continue medical treatment as before,  to f/u any worsening symptoms or concerns

## 2015-03-13 NOTE — Assessment & Plan Note (Signed)
[place] for wean off effexor, then start lexapro 10 qd,  to f/u any worsening symptoms or concerns

## 2015-03-15 ENCOUNTER — Other Ambulatory Visit: Payer: Self-pay | Admitting: Internal Medicine

## 2015-03-16 NOTE — Telephone Encounter (Signed)
Done hardcopy to Corinne  

## 2015-03-16 NOTE — Telephone Encounter (Signed)
Please advise, ok to refill?

## 2015-03-18 ENCOUNTER — Encounter: Payer: Self-pay | Admitting: Internal Medicine

## 2015-03-19 DIAGNOSIS — R079 Chest pain, unspecified: Secondary | ICD-10-CM | POA: Diagnosis not present

## 2015-03-20 DIAGNOSIS — R079 Chest pain, unspecified: Secondary | ICD-10-CM | POA: Diagnosis not present

## 2015-03-20 DIAGNOSIS — R0789 Other chest pain: Secondary | ICD-10-CM | POA: Diagnosis not present

## 2015-03-21 DIAGNOSIS — I471 Supraventricular tachycardia: Secondary | ICD-10-CM | POA: Diagnosis not present

## 2015-03-21 DIAGNOSIS — Z853 Personal history of malignant neoplasm of breast: Secondary | ICD-10-CM | POA: Insufficient documentation

## 2015-03-30 DIAGNOSIS — I421 Obstructive hypertrophic cardiomyopathy: Secondary | ICD-10-CM | POA: Diagnosis not present

## 2015-04-13 ENCOUNTER — Other Ambulatory Visit: Payer: Self-pay | Admitting: Internal Medicine

## 2015-04-13 NOTE — Telephone Encounter (Signed)
Lorrin Mais too soon, as a 6 mo total rx was given jan 2017

## 2015-04-13 NOTE — Telephone Encounter (Signed)
Please advise, can we refill this

## 2015-04-14 ENCOUNTER — Other Ambulatory Visit: Payer: Self-pay | Admitting: Internal Medicine

## 2015-04-15 DIAGNOSIS — I422 Other hypertrophic cardiomyopathy: Secondary | ICD-10-CM | POA: Diagnosis not present

## 2015-04-15 NOTE — Telephone Encounter (Signed)
Done hardcopy to Corinne  

## 2015-04-15 NOTE — Telephone Encounter (Signed)
Rx faxed to pharmacy  

## 2015-04-21 DIAGNOSIS — Z01810 Encounter for preprocedural cardiovascular examination: Secondary | ICD-10-CM | POA: Diagnosis not present

## 2015-04-21 DIAGNOSIS — I10 Essential (primary) hypertension: Secondary | ICD-10-CM | POA: Diagnosis not present

## 2015-04-21 DIAGNOSIS — I421 Obstructive hypertrophic cardiomyopathy: Secondary | ICD-10-CM | POA: Diagnosis not present

## 2015-04-21 DIAGNOSIS — R072 Precordial pain: Secondary | ICD-10-CM | POA: Diagnosis not present

## 2015-04-21 DIAGNOSIS — R0602 Shortness of breath: Secondary | ICD-10-CM | POA: Diagnosis not present

## 2015-04-26 DIAGNOSIS — F329 Major depressive disorder, single episode, unspecified: Secondary | ICD-10-CM | POA: Diagnosis not present

## 2015-04-26 DIAGNOSIS — G901 Familial dysautonomia [Riley-Day]: Secondary | ICD-10-CM | POA: Diagnosis not present

## 2015-04-26 DIAGNOSIS — I340 Nonrheumatic mitral (valve) insufficiency: Secondary | ICD-10-CM | POA: Diagnosis not present

## 2015-04-26 DIAGNOSIS — I422 Other hypertrophic cardiomyopathy: Secondary | ICD-10-CM | POA: Diagnosis not present

## 2015-04-26 DIAGNOSIS — Z9889 Other specified postprocedural states: Secondary | ICD-10-CM | POA: Diagnosis not present

## 2015-04-26 DIAGNOSIS — I5033 Acute on chronic diastolic (congestive) heart failure: Secondary | ICD-10-CM | POA: Diagnosis not present

## 2015-04-26 DIAGNOSIS — F419 Anxiety disorder, unspecified: Secondary | ICD-10-CM | POA: Diagnosis not present

## 2015-04-26 DIAGNOSIS — I110 Hypertensive heart disease with heart failure: Secondary | ICD-10-CM | POA: Diagnosis not present

## 2015-04-26 DIAGNOSIS — I5032 Chronic diastolic (congestive) heart failure: Secondary | ICD-10-CM | POA: Diagnosis not present

## 2015-04-26 DIAGNOSIS — I152 Hypertension secondary to endocrine disorders: Secondary | ICD-10-CM | POA: Diagnosis not present

## 2015-04-26 DIAGNOSIS — R768 Other specified abnormal immunological findings in serum: Secondary | ICD-10-CM | POA: Diagnosis not present

## 2015-04-26 DIAGNOSIS — D62 Acute posthemorrhagic anemia: Secondary | ICD-10-CM | POA: Diagnosis not present

## 2015-04-26 DIAGNOSIS — E039 Hypothyroidism, unspecified: Secondary | ICD-10-CM | POA: Diagnosis not present

## 2015-04-26 DIAGNOSIS — J811 Chronic pulmonary edema: Secondary | ICD-10-CM | POA: Diagnosis not present

## 2015-04-26 DIAGNOSIS — J90 Pleural effusion, not elsewhere classified: Secondary | ICD-10-CM | POA: Diagnosis not present

## 2015-04-26 DIAGNOSIS — R918 Other nonspecific abnormal finding of lung field: Secondary | ICD-10-CM | POA: Diagnosis not present

## 2015-04-26 DIAGNOSIS — Z853 Personal history of malignant neoplasm of breast: Secondary | ICD-10-CM | POA: Diagnosis not present

## 2015-04-26 DIAGNOSIS — Z4682 Encounter for fitting and adjustment of non-vascular catheter: Secondary | ICD-10-CM | POA: Diagnosis not present

## 2015-04-26 DIAGNOSIS — J9811 Atelectasis: Secondary | ICD-10-CM | POA: Diagnosis not present

## 2015-04-26 DIAGNOSIS — I471 Supraventricular tachycardia: Secondary | ICD-10-CM | POA: Diagnosis not present

## 2015-04-26 DIAGNOSIS — I421 Obstructive hypertrophic cardiomyopathy: Secondary | ICD-10-CM | POA: Diagnosis not present

## 2015-04-26 DIAGNOSIS — I209 Angina pectoris, unspecified: Secondary | ICD-10-CM | POA: Diagnosis not present

## 2015-04-26 DIAGNOSIS — Z8543 Personal history of malignant neoplasm of ovary: Secondary | ICD-10-CM | POA: Diagnosis not present

## 2015-04-27 DIAGNOSIS — Z9889 Other specified postprocedural states: Secondary | ICD-10-CM | POA: Diagnosis not present

## 2015-04-27 DIAGNOSIS — I340 Nonrheumatic mitral (valve) insufficiency: Secondary | ICD-10-CM | POA: Diagnosis not present

## 2015-04-27 DIAGNOSIS — I471 Supraventricular tachycardia: Secondary | ICD-10-CM | POA: Diagnosis not present

## 2015-04-27 DIAGNOSIS — I209 Angina pectoris, unspecified: Secondary | ICD-10-CM | POA: Diagnosis not present

## 2015-04-27 DIAGNOSIS — J90 Pleural effusion, not elsewhere classified: Secondary | ICD-10-CM | POA: Diagnosis not present

## 2015-04-27 DIAGNOSIS — F419 Anxiety disorder, unspecified: Secondary | ICD-10-CM | POA: Diagnosis not present

## 2015-04-27 DIAGNOSIS — I421 Obstructive hypertrophic cardiomyopathy: Secondary | ICD-10-CM | POA: Diagnosis not present

## 2015-04-27 DIAGNOSIS — I5032 Chronic diastolic (congestive) heart failure: Secondary | ICD-10-CM | POA: Diagnosis not present

## 2015-04-27 DIAGNOSIS — I422 Other hypertrophic cardiomyopathy: Secondary | ICD-10-CM | POA: Diagnosis not present

## 2015-04-27 DIAGNOSIS — R918 Other nonspecific abnormal finding of lung field: Secondary | ICD-10-CM | POA: Diagnosis not present

## 2015-04-27 DIAGNOSIS — J9811 Atelectasis: Secondary | ICD-10-CM | POA: Diagnosis not present

## 2015-04-28 DIAGNOSIS — F419 Anxiety disorder, unspecified: Secondary | ICD-10-CM | POA: Diagnosis not present

## 2015-04-28 DIAGNOSIS — I471 Supraventricular tachycardia: Secondary | ICD-10-CM | POA: Diagnosis not present

## 2015-04-28 DIAGNOSIS — Z4682 Encounter for fitting and adjustment of non-vascular catheter: Secondary | ICD-10-CM | POA: Diagnosis not present

## 2015-04-28 DIAGNOSIS — I152 Hypertension secondary to endocrine disorders: Secondary | ICD-10-CM | POA: Diagnosis not present

## 2015-04-28 DIAGNOSIS — R768 Other specified abnormal immunological findings in serum: Secondary | ICD-10-CM | POA: Insufficient documentation

## 2015-04-28 DIAGNOSIS — I422 Other hypertrophic cardiomyopathy: Secondary | ICD-10-CM | POA: Diagnosis not present

## 2015-04-28 DIAGNOSIS — I209 Angina pectoris, unspecified: Secondary | ICD-10-CM | POA: Diagnosis not present

## 2015-04-28 DIAGNOSIS — Z853 Personal history of malignant neoplasm of breast: Secondary | ICD-10-CM | POA: Diagnosis not present

## 2015-04-28 DIAGNOSIS — R918 Other nonspecific abnormal finding of lung field: Secondary | ICD-10-CM | POA: Diagnosis not present

## 2015-04-29 DIAGNOSIS — I422 Other hypertrophic cardiomyopathy: Secondary | ICD-10-CM | POA: Diagnosis not present

## 2015-04-29 DIAGNOSIS — J811 Chronic pulmonary edema: Secondary | ICD-10-CM | POA: Diagnosis not present

## 2015-04-29 DIAGNOSIS — J9811 Atelectasis: Secondary | ICD-10-CM | POA: Diagnosis not present

## 2015-04-29 DIAGNOSIS — J90 Pleural effusion, not elsewhere classified: Secondary | ICD-10-CM | POA: Diagnosis not present

## 2015-04-30 DIAGNOSIS — J90 Pleural effusion, not elsewhere classified: Secondary | ICD-10-CM | POA: Diagnosis not present

## 2015-04-30 DIAGNOSIS — I422 Other hypertrophic cardiomyopathy: Secondary | ICD-10-CM | POA: Diagnosis not present

## 2015-04-30 DIAGNOSIS — J9811 Atelectasis: Secondary | ICD-10-CM | POA: Diagnosis not present

## 2015-05-01 DIAGNOSIS — J90 Pleural effusion, not elsewhere classified: Secondary | ICD-10-CM | POA: Diagnosis not present

## 2015-05-01 DIAGNOSIS — I422 Other hypertrophic cardiomyopathy: Secondary | ICD-10-CM | POA: Diagnosis not present

## 2015-05-01 DIAGNOSIS — R918 Other nonspecific abnormal finding of lung field: Secondary | ICD-10-CM | POA: Diagnosis not present

## 2015-05-02 DIAGNOSIS — I422 Other hypertrophic cardiomyopathy: Secondary | ICD-10-CM | POA: Diagnosis not present

## 2015-05-03 DIAGNOSIS — F419 Anxiety disorder, unspecified: Secondary | ICD-10-CM | POA: Diagnosis not present

## 2015-05-03 DIAGNOSIS — I209 Angina pectoris, unspecified: Secondary | ICD-10-CM | POA: Diagnosis not present

## 2015-05-03 DIAGNOSIS — Z9889 Other specified postprocedural states: Secondary | ICD-10-CM | POA: Diagnosis not present

## 2015-05-03 DIAGNOSIS — J9811 Atelectasis: Secondary | ICD-10-CM | POA: Diagnosis not present

## 2015-05-03 DIAGNOSIS — R918 Other nonspecific abnormal finding of lung field: Secondary | ICD-10-CM | POA: Diagnosis not present

## 2015-05-03 DIAGNOSIS — J90 Pleural effusion, not elsewhere classified: Secondary | ICD-10-CM | POA: Diagnosis not present

## 2015-05-03 DIAGNOSIS — I422 Other hypertrophic cardiomyopathy: Secondary | ICD-10-CM | POA: Diagnosis not present

## 2015-05-03 DIAGNOSIS — I471 Supraventricular tachycardia: Secondary | ICD-10-CM | POA: Diagnosis not present

## 2015-05-09 ENCOUNTER — Encounter: Payer: Self-pay | Admitting: Internal Medicine

## 2015-05-09 DIAGNOSIS — R0789 Other chest pain: Secondary | ICD-10-CM | POA: Diagnosis not present

## 2015-05-09 DIAGNOSIS — Z79899 Other long term (current) drug therapy: Secondary | ICD-10-CM | POA: Diagnosis not present

## 2015-05-09 DIAGNOSIS — R000 Tachycardia, unspecified: Secondary | ICD-10-CM | POA: Diagnosis not present

## 2015-05-09 DIAGNOSIS — Z91040 Latex allergy status: Secondary | ICD-10-CM | POA: Diagnosis not present

## 2015-05-09 DIAGNOSIS — Z79890 Hormone replacement therapy (postmenopausal): Secondary | ICD-10-CM | POA: Diagnosis not present

## 2015-05-09 DIAGNOSIS — R079 Chest pain, unspecified: Secondary | ICD-10-CM | POA: Diagnosis not present

## 2015-05-09 DIAGNOSIS — R002 Palpitations: Secondary | ICD-10-CM | POA: Diagnosis not present

## 2015-05-09 DIAGNOSIS — Z9889 Other specified postprocedural states: Secondary | ICD-10-CM | POA: Diagnosis not present

## 2015-05-09 DIAGNOSIS — Z853 Personal history of malignant neoplasm of breast: Secondary | ICD-10-CM | POA: Diagnosis not present

## 2015-05-09 DIAGNOSIS — Z7982 Long term (current) use of aspirin: Secondary | ICD-10-CM | POA: Diagnosis not present

## 2015-05-09 DIAGNOSIS — Z792 Long term (current) use of antibiotics: Secondary | ICD-10-CM | POA: Diagnosis not present

## 2015-05-09 DIAGNOSIS — Z7951 Long term (current) use of inhaled steroids: Secondary | ICD-10-CM | POA: Diagnosis not present

## 2015-05-09 DIAGNOSIS — Z951 Presence of aortocoronary bypass graft: Secondary | ICD-10-CM | POA: Diagnosis not present

## 2015-05-09 DIAGNOSIS — Z8543 Personal history of malignant neoplasm of ovary: Secondary | ICD-10-CM | POA: Diagnosis not present

## 2015-05-10 ENCOUNTER — Encounter: Payer: Self-pay | Admitting: Internal Medicine

## 2015-05-10 DIAGNOSIS — Z91040 Latex allergy status: Secondary | ICD-10-CM | POA: Diagnosis not present

## 2015-05-10 DIAGNOSIS — R7989 Other specified abnormal findings of blood chemistry: Secondary | ICD-10-CM | POA: Diagnosis not present

## 2015-05-10 DIAGNOSIS — I4891 Unspecified atrial fibrillation: Secondary | ICD-10-CM | POA: Diagnosis not present

## 2015-05-10 DIAGNOSIS — I421 Obstructive hypertrophic cardiomyopathy: Secondary | ICD-10-CM | POA: Diagnosis not present

## 2015-05-10 DIAGNOSIS — I10 Essential (primary) hypertension: Secondary | ICD-10-CM | POA: Diagnosis not present

## 2015-05-10 DIAGNOSIS — Z8543 Personal history of malignant neoplasm of ovary: Secondary | ICD-10-CM | POA: Diagnosis not present

## 2015-05-10 DIAGNOSIS — R748 Abnormal levels of other serum enzymes: Secondary | ICD-10-CM | POA: Diagnosis not present

## 2015-05-10 DIAGNOSIS — Z7982 Long term (current) use of aspirin: Secondary | ICD-10-CM | POA: Diagnosis not present

## 2015-05-10 DIAGNOSIS — J9811 Atelectasis: Secondary | ICD-10-CM | POA: Diagnosis not present

## 2015-05-10 DIAGNOSIS — Z5181 Encounter for therapeutic drug level monitoring: Secondary | ICD-10-CM | POA: Diagnosis not present

## 2015-05-10 DIAGNOSIS — I313 Pericardial effusion (noninflammatory): Secondary | ICD-10-CM | POA: Diagnosis not present

## 2015-05-10 DIAGNOSIS — I319 Disease of pericardium, unspecified: Secondary | ICD-10-CM | POA: Diagnosis not present

## 2015-05-10 DIAGNOSIS — F329 Major depressive disorder, single episode, unspecified: Secondary | ICD-10-CM | POA: Diagnosis present

## 2015-05-10 DIAGNOSIS — R079 Chest pain, unspecified: Secondary | ICD-10-CM | POA: Diagnosis not present

## 2015-05-10 DIAGNOSIS — J90 Pleural effusion, not elsewhere classified: Secondary | ICD-10-CM | POA: Diagnosis not present

## 2015-05-10 DIAGNOSIS — N951 Menopausal and female climacteric states: Secondary | ICD-10-CM | POA: Diagnosis not present

## 2015-05-10 DIAGNOSIS — R0789 Other chest pain: Secondary | ICD-10-CM | POA: Diagnosis present

## 2015-05-10 DIAGNOSIS — I9789 Other postprocedural complications and disorders of the circulatory system, not elsewhere classified: Secondary | ICD-10-CM | POA: Diagnosis present

## 2015-05-10 DIAGNOSIS — Z952 Presence of prosthetic heart valve: Secondary | ICD-10-CM | POA: Diagnosis not present

## 2015-05-10 DIAGNOSIS — I471 Supraventricular tachycardia: Secondary | ICD-10-CM | POA: Diagnosis not present

## 2015-05-10 DIAGNOSIS — Z7401 Bed confinement status: Secondary | ICD-10-CM | POA: Diagnosis not present

## 2015-05-10 DIAGNOSIS — R000 Tachycardia, unspecified: Secondary | ICD-10-CM | POA: Diagnosis not present

## 2015-05-10 DIAGNOSIS — Z853 Personal history of malignant neoplasm of breast: Secondary | ICD-10-CM | POA: Diagnosis not present

## 2015-05-10 DIAGNOSIS — Z79890 Hormone replacement therapy (postmenopausal): Secondary | ICD-10-CM | POA: Diagnosis not present

## 2015-05-10 DIAGNOSIS — Z951 Presence of aortocoronary bypass graft: Secondary | ICD-10-CM | POA: Diagnosis not present

## 2015-05-10 DIAGNOSIS — I209 Angina pectoris, unspecified: Secondary | ICD-10-CM | POA: Diagnosis not present

## 2015-05-10 DIAGNOSIS — Z792 Long term (current) use of antibiotics: Secondary | ICD-10-CM | POA: Diagnosis not present

## 2015-05-10 DIAGNOSIS — Z7951 Long term (current) use of inhaled steroids: Secondary | ICD-10-CM | POA: Diagnosis not present

## 2015-05-10 DIAGNOSIS — Z79899 Other long term (current) drug therapy: Secondary | ICD-10-CM | POA: Diagnosis not present

## 2015-05-10 DIAGNOSIS — R0609 Other forms of dyspnea: Secondary | ICD-10-CM | POA: Diagnosis not present

## 2015-05-10 DIAGNOSIS — Z9889 Other specified postprocedural states: Secondary | ICD-10-CM | POA: Diagnosis not present

## 2015-05-10 DIAGNOSIS — R002 Palpitations: Secondary | ICD-10-CM | POA: Diagnosis not present

## 2015-05-10 DIAGNOSIS — F419 Anxiety disorder, unspecified: Secondary | ICD-10-CM | POA: Diagnosis not present

## 2015-05-10 DIAGNOSIS — I447 Left bundle-branch block, unspecified: Secondary | ICD-10-CM | POA: Diagnosis present

## 2015-05-10 DIAGNOSIS — E039 Hypothyroidism, unspecified: Secondary | ICD-10-CM | POA: Diagnosis not present

## 2015-05-10 NOTE — Telephone Encounter (Signed)
Tammy or staff, frank and Herbert Suits would like to be seen at 2 visits at the same time (I think like an 815, then 830 for example)  Can this be helped, thanks

## 2015-05-10 NOTE — Telephone Encounter (Signed)
tammy and staff,  This is more info on the [place] couple visit request  thanks

## 2015-05-21 ENCOUNTER — Encounter: Payer: Self-pay | Admitting: Internal Medicine

## 2015-05-21 ENCOUNTER — Ambulatory Visit (INDEPENDENT_AMBULATORY_CARE_PROVIDER_SITE_OTHER): Payer: BLUE CROSS/BLUE SHIELD | Admitting: Internal Medicine

## 2015-05-21 VITALS — BP 140/80 | HR 81 | Temp 98.2°F | Resp 20

## 2015-05-21 DIAGNOSIS — I10 Essential (primary) hypertension: Secondary | ICD-10-CM

## 2015-05-21 DIAGNOSIS — R300 Dysuria: Secondary | ICD-10-CM | POA: Insufficient documentation

## 2015-05-21 DIAGNOSIS — H9201 Otalgia, right ear: Secondary | ICD-10-CM | POA: Diagnosis not present

## 2015-05-21 MED ORDER — CEFUROXIME AXETIL 250 MG PO TABS: 250.0000 mg | ORAL_TABLET | Freq: Two times a day (BID) | ORAL | Status: DC

## 2015-05-21 NOTE — Progress Notes (Signed)
Pre visit review using our clinic review tool, if applicable. No additional management support is needed unless otherwise documented below in the visit note. 

## 2015-05-21 NOTE — Progress Notes (Signed)
Subjective:    Patient ID: [name], female    DOB: May 07, 1955, 60 y.o.   MRN: [id number]  HPI Here to f/u recent [facility] x 2 for SVT/ventricular septal myomectomy/MV repair in the setting of SVT/hypertrophic CM and dysautonomia. Was home then back to [facility] for second hospn with SVT vs VT, with afib/flutter started on amiodarone gtt now on orals.  Plan is for 30 days tx.  To f/u here today, then next wk with surgeon and ED specialist thurs next wk. Staples still intact, to remain till next wk.  Thinks she may have had UTI with dysuria and mild blood post hosp , and right > left ear pain - ? Infection, has taken 3 pills of cipro since she had that on hand   Wt Readings from Last 3 Encounters:  11/21/12 213 lb (96.616 kg)  11/12/12 210 lb 1.9 oz (95.31 kg)  10/07/12 212 lb (96.163 kg)  No longer taking lasix after most recent hospn.   Past Medical History  Diagnosis Date  . Chronic sinus infection 04/12/2010  . Abdominal pain, epigastric 10/25/2007  . ADD 01/20/2009  . ALLERGIC RHINITIS 10/15/2006  . ANGIOEDEMA 08/12/2008  . ANXIETY 10/15/2006  . ASTHMATIC BRONCHITIS, ACUTE 01/27/2008  . Cellulitis and abscess of leg, except foot 08/12/2007  . CELLULITIS, [place] 12/21/2008  . CHEST PAIN 06/14/2007  . COLECTOMY, PARTIAL, WITH ANASTOMOSIS, HX OF 10/15/2006  . COLONIC POLYPS, HX OF 10/15/2006  . CYST, OVARIAN NEC/NOS 10/15/2006  . Dengue 02/22/2010  . DVT, HX OF 10/15/2006  . DYSAUTONOMIA 10/15/2006  . Dysfunction of eustachian tube 05/26/2009  . GI BLEEDING 04/04/2007  . Glossitis 03/06/2008  . HEADACHE, CHRONIC 04/04/2007  . Hematemesis 10/25/2007  . HEMORRHOIDS 04/04/2007  . HYPERLIPIDEMIA 12/12/2007  . HYPERTENSION 10/15/2006  . HYPOTHYROIDISM 03/06/2008  . IBS 04/04/2007  . MASTECTOMY, BILATERAL, HX OF 08/12/2009  . NECK MASS 07/01/2007  . NEOP, MALIGNANT, FEMALE BREAST NOS 10/15/2006  . 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 SYNCOPE 07/01/2007  . Wheezing 08/20/2008  . WOUND, OPEN, LEG,  WITHOUT COMPLICATION [id number]   Past Surgical History  Procedure Laterality Date  . Abdominal hysterectomy    . Oophorectomy    . Cholecystectomy    . Spigellian hernia    . Multiple ablations    . Bilateral mastectomy/reconstruction    . Cesarean section    . Left and right heart catheterization with coronary angiogram N/A 10/07/2012    Procedure: LEFT AND RIGHT HEART CATHETERIZATION WITH CORONARY ANGIOGRAM;  Surgeon: Jolaine Artist, MD;  Location: [facility] CATH LAB;  Service: Cardiovascular;  Laterality: N/A;    reports that she has never smoked. She does not have any smokeless tobacco history on file. She reports that she does not drink alcohol or use illicit drugs. family history includes Cancer in her sister. Allergies  Allergen Reactions  . Fish Allergy Swelling  . Latex Dermatitis    More than a day causes blisters   . Penicillins     REACTION: shock   Current Outpatient Prescriptions on File Prior to Visit  Medication Sig Dispense Refill  . acetaminophen-codeine (TYLENOL #3) 300-30 MG tablet TAKE ONE TABLET FOUR TIMES DAILY AS NEEDED FOR PAIN AS DIRECTED 120 tablet 0  . escitalopram (LEXAPRO) 10 MG tablet Take 1 tablet (10 mg total) by mouth daily. 90 tablet 3  . levothyroxine (SYNTHROID, LEVOTHROID) 50 MCG tablet Take 50 mcg by mouth daily before breakfast.    . propranolol ER (INDERAL LA) 120  MG 24 hr capsule TAKE 2 CAPSULES DAILY AS DIRECTED 60 capsule 2  . venlafaxine XR (EFFEXOR XR) 75 MG 24 hr capsule Take 1 capsule (75 mg total) by mouth daily with breakfast. 21 capsule 0  . zolpidem (AMBIEN) 10 MG tablet TAKE 1 TABLET BY MOUTH EVERY DAY AT BEDTIME AS NEEDED 90 tablet 1   No current facility-administered medications on file prior to visit.     Review of Systems  Constitutional: Negative for unusual diaphoresis or night sweats HENT: Negative for ear swelling or discharge Eyes: Negative for worsening visual haziness  Respiratory: Negative for choking and stridor.    Gastrointestinal: Negative for distension or worsening eructation Genitourinary: Negative for retention or change in urine volume.  Musculoskeletal: Negative for other MSK pain or swelling Skin: Negative for color change and worsening wound Neurological: Negative for tremors and numbness other than noted  Psychiatric/Behavioral: Negative for decreased concentration or agitation other than above       Objective:   Physical Exam BP 140/80 mmHg  Pulse 81  Temp(Src) 98.2 F (36.8 C) (Oral)  Resp 20  SpO2 94%\VS noted,  Constitutional: Pt appears in no apparent distress HENT: Head: NCAT.  Right Ear: External ear normal.  Left Ear: External ear normal.  Bilat tm's with mild erythema.  Max sinus areas non tender.  Pharynx with mild erythema, no exudate Eyes: . Pupils are equal, round, and reactive to light. Conjunctivae and EOM are normal Neck: Normal range of motion. Neck supple.  Cardiovascular: Normal rate and regular rhythm.  gr 2/6 syst murmur Pulmonary/Chest: Effort normal and breath sounds without rales or wheezing.  Neurological: Pt is alert. Not confused , motor grossly intact Skin: Skin is warm. No rash, no LE edema Psychiatric: Pt behavior is normal. No agitation.     Assessment & Plan:

## 2015-05-21 NOTE — Patient Instructions (Signed)
Please take all new medication as prescribed - the ceftin  Please continue all other medications as before, and refills have been done if requested.  Please have the pharmacy call with any other refills you may need.  Please keep your appointments with your specialists as you may have planned  Please return in 6 months, or sooner if needed

## 2015-05-22 NOTE — Assessment & Plan Note (Signed)
Mild to mod, for antibx course,  to f/u any worsening symptoms or concerns 

## 2015-05-22 NOTE — Assessment & Plan Note (Signed)
prob uti, has already taken home cipro, will finish tx with current antibx, urine studies not likely to be helpful

## 2015-05-22 NOTE — Assessment & Plan Note (Signed)
stable overall by history and exam, recent data reviewed with pt, and pt to continue medical treatment as before,  to f/u any worsening symptoms or concerns SpO2 Readings from Last 3 Encounters:  05/21/15 94%  03/11/15 95%  07/08/14 98%

## 2015-05-27 DIAGNOSIS — I340 Nonrheumatic mitral (valve) insufficiency: Secondary | ICD-10-CM | POA: Diagnosis not present

## 2015-05-27 DIAGNOSIS — R918 Other nonspecific abnormal finding of lung field: Secondary | ICD-10-CM | POA: Diagnosis not present

## 2015-05-27 DIAGNOSIS — Z9889 Other specified postprocedural states: Secondary | ICD-10-CM | POA: Diagnosis not present

## 2015-06-09 ENCOUNTER — Other Ambulatory Visit: Payer: Self-pay | Admitting: Internal Medicine

## 2015-06-09 NOTE — Telephone Encounter (Signed)
Husband left msg on triage stating pharmacy been trying to get Tylenol # 3 refill requesting ASAP. MD out of office pls advise....[name]

## 2015-06-10 NOTE — Addendum Note (Signed)
Addended by: Earnstine Regal on: 06/10/2015 11:17 AM   Modules accepted: Orders

## 2015-06-10 NOTE — Telephone Encounter (Signed)
Faxed script back to [place].Marland KitchenJohny Chess

## 2015-06-15 DIAGNOSIS — Z9889 Other specified postprocedural states: Secondary | ICD-10-CM | POA: Diagnosis not present

## 2015-06-15 DIAGNOSIS — I422 Other hypertrophic cardiomyopathy: Secondary | ICD-10-CM | POA: Diagnosis not present

## 2015-06-15 DIAGNOSIS — I10 Essential (primary) hypertension: Secondary | ICD-10-CM | POA: Diagnosis not present

## 2015-06-15 DIAGNOSIS — I480 Paroxysmal atrial fibrillation: Secondary | ICD-10-CM | POA: Diagnosis not present

## 2015-06-17 DIAGNOSIS — H1045 Other chronic allergic conjunctivitis: Secondary | ICD-10-CM | POA: Diagnosis not present

## 2015-06-23 DIAGNOSIS — I480 Paroxysmal atrial fibrillation: Secondary | ICD-10-CM | POA: Diagnosis not present

## 2015-06-25 ENCOUNTER — Telehealth: Payer: Self-pay | Admitting: *Deleted

## 2015-06-25 MED ORDER — ACETAMINOPHEN-CODEINE #3 300-30 MG PO TABS: ORAL_TABLET | ORAL | Status: DC

## 2015-06-25 NOTE — Telephone Encounter (Signed)
Prescription given to [name]

## 2015-06-25 NOTE — Telephone Encounter (Signed)
Done hardcopy to Corinne  

## 2015-06-25 NOTE — Telephone Encounter (Signed)
Left msg on triage needing refill on Tylenol # 3. Last rx only receive #30 due to MD being out of office. Wife is was full amount #120...[name]

## 2015-06-25 NOTE — Telephone Encounter (Signed)
Notified pt rx fax to [facility] pharmacy...[name]

## 2015-07-09 DIAGNOSIS — I480 Paroxysmal atrial fibrillation: Secondary | ICD-10-CM | POA: Diagnosis not present

## 2015-07-23 ENCOUNTER — Telehealth: Payer: Self-pay | Admitting: Internal Medicine

## 2015-07-24 NOTE — Telephone Encounter (Signed)
Please advise on refill request

## 2015-07-27 ENCOUNTER — Other Ambulatory Visit: Payer: Self-pay | Admitting: Internal Medicine

## 2015-07-27 NOTE — Telephone Encounter (Signed)
Done hardcopy to Corinne  

## 2015-07-27 NOTE — Telephone Encounter (Signed)
States usually this is sent to their pharmacy.

## 2015-07-27 NOTE — Telephone Encounter (Signed)
Patient lives [place] away.  Would like to know if there is any other way this medication can be gotten to her?

## 2015-07-27 NOTE — Telephone Encounter (Signed)
Sent to pharmacy left message for patient

## 2015-07-27 NOTE — Telephone Encounter (Signed)
Can be reached at [phone]

## 2015-07-27 NOTE — Telephone Encounter (Signed)
Patient aware placed up front for pick up

## 2015-07-28 ENCOUNTER — Other Ambulatory Visit: Payer: Self-pay | Admitting: Internal Medicine

## 2015-08-03 DIAGNOSIS — Z48812 Encounter for surgical aftercare following surgery on the circulatory system: Secondary | ICD-10-CM | POA: Diagnosis not present

## 2015-08-03 DIAGNOSIS — Z952 Presence of prosthetic heart valve: Secondary | ICD-10-CM | POA: Diagnosis not present

## 2015-08-05 DIAGNOSIS — Z952 Presence of prosthetic heart valve: Secondary | ICD-10-CM | POA: Diagnosis not present

## 2015-08-05 DIAGNOSIS — Z48812 Encounter for surgical aftercare following surgery on the circulatory system: Secondary | ICD-10-CM | POA: Diagnosis not present

## 2015-08-06 ENCOUNTER — Other Ambulatory Visit: Payer: Self-pay | Admitting: *Deleted

## 2015-08-06 MED ORDER — METOPROLOL SUCCINATE ER 50 MG PO TB24: 50.0000 mg | ORAL_TABLET | Freq: Every day | ORAL | 1 refills | Status: DC

## 2015-08-12 DIAGNOSIS — Z952 Presence of prosthetic heart valve: Secondary | ICD-10-CM | POA: Diagnosis not present

## 2015-08-12 DIAGNOSIS — Z48812 Encounter for surgical aftercare following surgery on the circulatory system: Secondary | ICD-10-CM | POA: Diagnosis not present

## 2015-08-13 DIAGNOSIS — Z952 Presence of prosthetic heart valve: Secondary | ICD-10-CM | POA: Diagnosis not present

## 2015-08-13 DIAGNOSIS — Z48812 Encounter for surgical aftercare following surgery on the circulatory system: Secondary | ICD-10-CM | POA: Diagnosis not present

## 2015-08-17 DIAGNOSIS — Z952 Presence of prosthetic heart valve: Secondary | ICD-10-CM | POA: Diagnosis not present

## 2015-08-17 DIAGNOSIS — Z48812 Encounter for surgical aftercare following surgery on the circulatory system: Secondary | ICD-10-CM | POA: Diagnosis not present

## 2015-08-24 ENCOUNTER — Other Ambulatory Visit: Payer: Self-pay | Admitting: Internal Medicine

## 2015-08-24 DIAGNOSIS — Z952 Presence of prosthetic heart valve: Secondary | ICD-10-CM | POA: Diagnosis not present

## 2015-08-24 DIAGNOSIS — Z48812 Encounter for surgical aftercare following surgery on the circulatory system: Secondary | ICD-10-CM | POA: Diagnosis not present

## 2015-08-24 NOTE — Telephone Encounter (Signed)
Faxed script back to [facility]...[name]

## 2015-08-24 NOTE — Telephone Encounter (Signed)
Done hardcopy to Corinne  Please let pt know, that at some point, maybe next prescription we need to start lowering the number of pills (say three times daily), as she may become dependent on this medication if used for an extended time.  If the diarrhea persists, we may need to look into different therapy, as this medication is not meant to be a long term solution

## 2015-08-26 DIAGNOSIS — Z48812 Encounter for surgical aftercare following surgery on the circulatory system: Secondary | ICD-10-CM | POA: Diagnosis not present

## 2015-08-26 DIAGNOSIS — Z952 Presence of prosthetic heart valve: Secondary | ICD-10-CM | POA: Diagnosis not present

## 2015-09-21 ENCOUNTER — Other Ambulatory Visit: Payer: Self-pay | Admitting: Internal Medicine

## 2015-09-27 ENCOUNTER — Other Ambulatory Visit: Payer: Self-pay | Admitting: Internal Medicine

## 2015-09-28 NOTE — Telephone Encounter (Signed)
Done hardcopy to Corinne  

## 2015-09-28 NOTE — Telephone Encounter (Signed)
faxed

## 2015-10-06 ENCOUNTER — Ambulatory Visit (INDEPENDENT_AMBULATORY_CARE_PROVIDER_SITE_OTHER): Payer: BLUE CROSS/BLUE SHIELD | Admitting: Internal Medicine

## 2015-10-06 VITALS — BP 130/70 | HR 73 | Temp 98.2°F | Resp 20

## 2015-10-06 DIAGNOSIS — K589 Irritable bowel syndrome without diarrhea: Secondary | ICD-10-CM | POA: Diagnosis not present

## 2015-10-06 DIAGNOSIS — I10 Essential (primary) hypertension: Secondary | ICD-10-CM | POA: Diagnosis not present

## 2015-10-06 DIAGNOSIS — H9201 Otalgia, right ear: Secondary | ICD-10-CM | POA: Diagnosis not present

## 2015-10-06 MED ORDER — ELUXADOLINE 100 MG PO TABS: 1.0000 | ORAL_TABLET | Freq: Two times a day (BID) | ORAL | 5 refills | Status: DC

## 2015-10-06 MED ORDER — CEFUROXIME AXETIL 250 MG PO TABS: 250.0000 mg | ORAL_TABLET | Freq: Two times a day (BID) | ORAL | 1 refills | Status: DC

## 2015-10-06 NOTE — Patient Instructions (Signed)
Please take all new medication as prescribed - the antibiotic, then when improved you can try the viberzi as well  Please continue all other medications as before, and refills have been done if requested.  Please have the pharmacy call with any other refills you may need.  Please keep your appointments with your specialists as you may have planned

## 2015-10-06 NOTE — Progress Notes (Signed)
Pre visit review using our clinic review tool, if applicable. No additional management support is needed unless otherwise documented below in the visit note. 

## 2015-10-08 NOTE — Assessment & Plan Note (Signed)
Inflamed again chronic recurrent, Mild to mod, for antibx course,  to f/u any worsening symptoms or concerns

## 2015-10-08 NOTE — Assessment & Plan Note (Signed)
Has been on tylenol #3 chronically for several yrs, will try to wean off and try viberzi asd,  to f/u any worsening symptoms or concerns

## 2015-10-08 NOTE — Progress Notes (Signed)
Subjective:    Patient ID: [name], female    DOB: [birth year], 60 y.o.   MRN: [id number]  HPI   Here with 2-3 days acute onset fever, facial pain, pressure, headache, general weakness and malaise, and greenish d/c, with mild ST and cough, but pt denies chest pain, wheezing, increased sob or doe, orthopnea, PND, increased LE swelling, palpitations, dizziness or syncope.  Pt denies new neurological symptoms such as new headache, or facial or extremity weakness or numbness   Pt denies polydipsia, polyuria.  Denies worsening reflux, abd pain, dysphagia, n/v, or blood but does still have chronic IBS symptoms and diarrhea s/p partial colon resection. Wt stable  Has tried numerous meds per GI several yrs ago without success, and the only relief seems to be with narcotic use, o/w "cant leave the house." Wt Readings from Last 3 Encounters:  11/21/12 213 lb (96.6 kg)  11/12/12 210 lb 1.9 oz (95.3 kg)  10/07/12 212 lb (96.2 kg)   Past Medical History:  Diagnosis Date  . Abdominal pain, epigastric 10/25/2007  . ADD 01/20/2009  . ALLERGIC RHINITIS 10/15/2006  . ANGIOEDEMA 08/12/2008  . ANXIETY 10/15/2006  . ASTHMATIC BRONCHITIS, ACUTE 01/27/2008  . Cellulitis and abscess of leg, except foot 08/12/2007  . CELLULITIS, [place] 12/21/2008  . CHEST PAIN 06/14/2007  . Chronic sinus infection 04/12/2010  . COLECTOMY, PARTIAL, WITH ANASTOMOSIS, HX OF 10/15/2006  . COLONIC POLYPS, HX OF 10/15/2006  . CYST, OVARIAN NEC/NOS 10/15/2006  . Dengue 02/22/2010  . DVT, HX OF 10/15/2006  . DYSAUTONOMIA 10/15/2006  . Dysfunction of eustachian tube 05/26/2009  . GI BLEEDING 04/04/2007  . Glossitis 03/06/2008  . HEADACHE, CHRONIC 04/04/2007  . Hematemesis 10/25/2007  . HEMORRHOIDS 04/04/2007  . HYPERLIPIDEMIA 12/12/2007  . HYPERTENSION 10/15/2006  . HYPOTHYROIDISM 03/06/2008  . IBS 04/04/2007  . MASTECTOMY, BILATERAL, HX OF 08/12/2009  . NECK MASS 07/01/2007  . NEOP, MALIGNANT, FEMALE BREAST NOS 10/15/2006  . NEOP,  MALIGNANT, 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  . SYNCOPE 07/01/2007  . Wheezing 08/20/2008  . WOUND, OPEN, LEG, WITHOUT COMPLICATION [id number]   Past Surgical History:  Procedure Laterality Date  . ABDOMINAL HYSTERECTOMY    . bilateral mastectomy/reconstruction    . CESAREAN SECTION    . CHOLECYSTECTOMY    . LEFT AND RIGHT HEART CATHETERIZATION WITH CORONARY ANGIOGRAM N/A 10/07/2012   Procedure: LEFT AND RIGHT HEART CATHETERIZATION WITH CORONARY ANGIOGRAM;  Surgeon: Jolaine Artist, MD;  Location: [facility] CATH LAB;  Service: Cardiovascular;  Laterality: N/A;  . multiple ablations    . OOPHORECTOMY    . spigellian hernia      reports that she has never smoked. She does not have any smokeless tobacco history on file. She reports that she does not drink alcohol or use drugs. family history includes Cancer in her sister. Allergies  Allergen Reactions  . Fish Allergy Swelling  . Latex Dermatitis    More than a day causes blisters   . Penicillins     REACTION: shock   Current Outpatient Prescriptions on File Prior to Visit  Medication Sig Dispense Refill  . acetaminophen-codeine (TYLENOL #3) 300-30 MG tablet TAKE 1 TABLET FOUR TIMES DAILY AS NEEDED FOR PAIN AS DIRECTED 120 tablet 0  . levothyroxine (SYNTHROID, LEVOTHROID) 50 MCG tablet Take 50 mcg by mouth daily before breakfast.    . levothyroxine (SYNTHROID, LEVOTHROID) 50 MCG tablet TAKE ONE TABLET EVERY DAY BEFORE BEFORE BREAKFAST 90 tablet 4  . metoprolol succinate (TOPROL-XL) 50 MG 24 hr tablet Take  1 tablet (50 mg total) by mouth daily. Take 1 by mouth daily 90 tablet 1  . propranolol ER (INDERAL LA) 120 MG 24 hr capsule TAKE 2 CAPSULES DAILY AS DIRECTED 60 capsule 2  . venlafaxine XR (EFFEXOR XR) 75 MG 24 hr capsule Take 1 capsule (75 mg total) by mouth daily with breakfast. 21 capsule 0  . zolpidem (AMBIEN) 10 MG tablet TAKE 1 TABLET BY MOUTH EVERY DAY AT BEDTIME AS NEEDED 90 tablet 1  . escitalopram (LEXAPRO) 10 MG tablet Take 1 tablet (10 mg  total) by mouth daily. 90 tablet 3   No current facility-administered medications on file prior to visit.     Review of Systems  Constitutional: Negative for unusual diaphoresis or night sweats HENT: Negative for ear swelling or discharge Eyes: Negative for worsening visual haziness  Respiratory: Negative for choking and stridor.   Gastrointestinal: Negative for distension or worsening eructation Genitourinary: Negative for retention or change in urine volume.  Musculoskeletal: Negative for other MSK pain or swelling Skin: Negative for color change and worsening wound Neurological: Negative for tremors and numbness other than noted  Psychiatric/Behavioral: Negative for decreased concentration or agitation other than above       Objective:   Physical Exam BP 130/70   Pulse 73   Temp 98.2 F (36.8 C) (Oral)   Resp 20   SpO2 94%  VS noted, mild ill Constitutional: Pt appears in no apparent distress HENT: Head: NCAT.  Right Ear: External ear normal.  Left Ear: External ear normal.  Right tm's with severe erythema.  Max sinus areas non tender.  Pharynx with mild erythema, no exudate Eyes: . Pupils are equal, round, and reactive to light. Conjunctivae and EOM are normal Neck: Normal range of motion. Neck supple.  Cardiovascular: Normal rate and regular rhythm.   Pulmonary/Chest: Effort normal and breath sounds without rales or wheezing.  Abd:  Soft, NT, ND, + BS Neurological: Pt is alert. Not confused , motor grossly intact Skin: Skin is warm. No rash, no LE edema Psychiatric: Pt behavior is normal. No agitation.      Assessment & Plan:

## 2015-10-08 NOTE — Assessment & Plan Note (Signed)
stable overall by history and exam, recent data reviewed with pt, and pt to continue medical treatment as before,  to f/u any worsening symptoms or concerns BP Readings from Last 3 Encounters:  10/06/15 130/70  05/21/15 140/80  03/11/15 128/70

## 2015-10-18 DIAGNOSIS — I422 Other hypertrophic cardiomyopathy: Secondary | ICD-10-CM | POA: Diagnosis not present

## 2015-11-02 ENCOUNTER — Ambulatory Visit: Payer: BLUE CROSS/BLUE SHIELD | Admitting: Internal Medicine

## 2015-11-02 ENCOUNTER — Other Ambulatory Visit: Payer: Self-pay | Admitting: Internal Medicine

## 2015-11-02 DIAGNOSIS — R197 Diarrhea, unspecified: Secondary | ICD-10-CM

## 2015-11-02 MED ORDER — ACETAMINOPHEN-CODEINE #3 300-30 MG PO TABS: ORAL_TABLET | ORAL | 2 refills | Status: DC

## 2015-11-02 MED ORDER — CEFUROXIME AXETIL 250 MG PO TABS: 250.0000 mg | ORAL_TABLET | Freq: Two times a day (BID) | ORAL | 1 refills | Status: DC

## 2015-11-08 ENCOUNTER — Encounter: Payer: Self-pay | Admitting: Internal Medicine

## 2015-11-18 ENCOUNTER — Encounter: Payer: Self-pay | Admitting: Internal Medicine

## 2015-11-18 ENCOUNTER — Ambulatory Visit (INDEPENDENT_AMBULATORY_CARE_PROVIDER_SITE_OTHER): Payer: BLUE CROSS/BLUE SHIELD | Admitting: Internal Medicine

## 2015-11-18 VITALS — BP 138/78 | HR 89 | Temp 98.6°F | Resp 20

## 2015-11-18 DIAGNOSIS — J069 Acute upper respiratory infection, unspecified: Secondary | ICD-10-CM

## 2015-11-18 DIAGNOSIS — K589 Irritable bowel syndrome without diarrhea: Secondary | ICD-10-CM | POA: Diagnosis not present

## 2015-11-18 DIAGNOSIS — J309 Allergic rhinitis, unspecified: Secondary | ICD-10-CM

## 2015-11-18 MED ORDER — ACETAMINOPHEN-CODEINE #3 300-30 MG PO TABS: ORAL_TABLET | ORAL | 2 refills | Status: DC

## 2015-11-18 MED ORDER — TRIAMCINOLONE ACETONIDE 55 MCG/ACT NA AERO: 2.0000 | INHALATION_SPRAY | Freq: Every day | NASAL | 12 refills | Status: DC

## 2015-11-18 MED ORDER — CETIRIZINE HCL 10 MG PO TABS: 10.0000 mg | ORAL_TABLET | Freq: Every day | ORAL | 11 refills | Status: DC

## 2015-11-18 MED ORDER — SULFAMETHOXAZOLE-TRIMETHOPRIM 800-160 MG PO TABS: 1.0000 | ORAL_TABLET | Freq: Two times a day (BID) | ORAL | 0 refills | Status: DC

## 2015-11-18 NOTE — Progress Notes (Signed)
Pre visit review using our clinic review tool, if applicable. No additional management support is needed unless otherwise documented below in the visit note. 

## 2015-11-18 NOTE — Patient Instructions (Signed)
Please take all new medication as prescribed - the antibiotic, zyrtec and nasacort as directed  Please continue all other medications as before, and refills have been done if requested - the tylenol #3  Please have the pharmacy call with any other refills you may need.  Please keep your appointments with your specialists as you may have planned

## 2015-11-18 NOTE — Progress Notes (Signed)
Subjective:    Patient ID: [name], female    DOB: [birth year], 60 y.o.   MRN: [id number]  HPI Here to f/u with 5 days acute onset fever, facial pain, left > right ear pain and pressure, several nosebleeds, headache, general weakness and malaise, and greenish d/c, with mild ST and cough, but pt denies chest pain, wheezing, increased sob or doe, orthopnea, PND, increased LE swelling, palpitations, dizziness or syncope.  Does have several wks ongoing nasal allergy symptoms with clearish congestion, itch and sneezing, without fever, pain, ST, cough, swelling or wheezing.  Denies worsening reflux, abd pain, dysphagia, n/v, or blood but still with uncontrolled daily IBS only controlled with tylenol #3 Past Medical History:  Diagnosis Date  . Abdominal pain, epigastric 10/25/2007  . ADD 01/20/2009  . ALLERGIC RHINITIS 10/15/2006  . ANGIOEDEMA 08/12/2008  . ANXIETY 10/15/2006  . ASTHMATIC BRONCHITIS, ACUTE 01/27/2008  . Cellulitis and abscess of leg, except foot 08/12/2007  . CELLULITIS, [place] 12/21/2008  . CHEST PAIN 06/14/2007  . Chronic sinus infection 04/12/2010  . COLECTOMY, PARTIAL, WITH ANASTOMOSIS, HX OF 10/15/2006  . COLONIC POLYPS, HX OF 10/15/2006  . CYST, OVARIAN NEC/NOS 10/15/2006  . Dengue 02/22/2010  . DVT, HX OF 10/15/2006  . DYSAUTONOMIA 10/15/2006  . Dysfunction of eustachian tube 05/26/2009  . GI BLEEDING 04/04/2007  . Glossitis 03/06/2008  . HEADACHE, CHRONIC 04/04/2007  . Hematemesis 10/25/2007  . HEMORRHOIDS 04/04/2007  . HYPERLIPIDEMIA 12/12/2007  . HYPERTENSION 10/15/2006  . HYPOTHYROIDISM 03/06/2008  . IBS 04/04/2007  . MASTECTOMY, BILATERAL, HX OF 08/12/2009  . NECK MASS 07/01/2007  . NEOP, MALIGNANT, FEMALE BREAST NOS 10/15/2006  . 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 SYNCOPE 07/01/2007  . Wheezing 08/20/2008  . WOUND, OPEN, LEG, WITHOUT COMPLICATION [id number]   Past Surgical History:  Procedure Laterality Date  . ABDOMINAL HYSTERECTOMY    . bilateral  mastectomy/reconstruction    . CESAREAN SECTION    . CHOLECYSTECTOMY    . LEFT AND RIGHT HEART CATHETERIZATION WITH CORONARY ANGIOGRAM N/A 10/07/2012   Procedure: LEFT AND RIGHT HEART CATHETERIZATION WITH CORONARY ANGIOGRAM;  Surgeon: Jolaine Artist, MD;  Location: [facility] CATH LAB;  Service: Cardiovascular;  Laterality: N/A;  . multiple ablations    . OOPHORECTOMY    . spigellian hernia      reports that she has never smoked. She does not have any smokeless tobacco history on file. She reports that she does not drink alcohol or use drugs. family history includes Cancer in her sister. Allergies  Allergen Reactions  . Fish Allergy Swelling  . Latex Dermatitis    More than a day causes blisters   . Penicillins     REACTION: shock   Current Outpatient Prescriptions on File Prior to Visit  Medication Sig Dispense Refill  . levothyroxine (SYNTHROID, LEVOTHROID) 50 MCG tablet Take 50 mcg by mouth daily before breakfast.    . levothyroxine (SYNTHROID, LEVOTHROID) 50 MCG tablet TAKE ONE TABLET EVERY DAY BEFORE BEFORE BREAKFAST 90 tablet 4  . metoprolol succinate (TOPROL-XL) 50 MG 24 hr tablet Take 1 tablet (50 mg total) by mouth daily. Take 1 by mouth daily 90 tablet 1  . propranolol ER (INDERAL LA) 120 MG 24 hr capsule TAKE 2 CAPSULES DAILY AS DIRECTED 60 capsule 2  . venlafaxine XR (EFFEXOR XR) 75 MG 24 hr capsule Take 1 capsule (75 mg total) by mouth daily with breakfast. 21 capsule 0  . zolpidem (AMBIEN) 10 MG tablet TAKE 1 TABLET BY MOUTH EVERY  DAY AT BEDTIME AS NEEDED 90 tablet 1  . escitalopram (LEXAPRO) 10 MG tablet Take 1 tablet (10 mg total) by mouth daily. 90 tablet 3   No current facility-administered medications on file prior to visit.    Review of Systems  Constitutional: Negative for unusual diaphoresis or night sweats HENT: Negative for ear swelling or discharge Eyes: Negative for worsening visual haziness  Respiratory: Negative for choking and stridor.   Gastrointestinal:  Negative for distension or worsening eructation Genitourinary: Negative for retention or change in urine volume.  Musculoskeletal: Negative for other MSK pain or swelling Skin: Negative for color change and worsening wound Neurological: Negative for tremors and numbness other than noted  Psychiatric/Behavioral: Negative for decreased concentration or agitation other than above   All other system neg per pt    Objective:   Physical Exam BP 138/78   Pulse 89   Temp 98.6 F (37 C) (Oral)   Resp 20   SpO2 93%  VS noted,  Constitutional: Pt appears in no apparent distress HENT: Head: NCAT.  Right Ear: External ear normal.  Left Ear: External ear normal.  Bilat tm's with mild erythema.  Max sinus areas mild tender.  Pharynx with mild erythema, no exudate Eyes: . Pupils are equal, round, and reactive to light. Conjunctivae and EOM are normal Neck: Normal range of motion. Neck supple.  Cardiovascular: Normal rate and regular rhythm.   Pulmonary/Chest: Effort normal and breath sounds without rales or wheezing.  Neurological: Pt is alert. Not confused , motor grossly intact Skin: Skin is warm. No rash, no LE edema Psychiatric: Pt behavior is normal. No agitation.  No other new exam findings    Assessment & Plan:

## 2015-11-20 NOTE — Assessment & Plan Note (Signed)
Also for OTC zyrtec and nasacort asd,  to f/u any worsening symptoms or concerns

## 2015-11-20 NOTE — Assessment & Plan Note (Signed)
Mild to mod, for antibx course,  to f/u any worsening symptoms or concerns 

## 2015-11-20 NOTE — Assessment & Plan Note (Signed)
With chronic diarrhea, viberzi no help, for tylenol #3 refill, declines GI referral

## 2015-11-23 ENCOUNTER — Telehealth: Payer: Self-pay | Admitting: Internal Medicine

## 2015-11-23 NOTE — Telephone Encounter (Signed)
Patient is aware 

## 2015-11-23 NOTE — Telephone Encounter (Signed)
Pt called in said that she had an allergic reaction to the the antibiotic.  Bloody and mucusy stools and stomach pain .  She would like a urgent call back from the nurse.  She lives 2 hours away, I directed her to urgent care

## 2015-11-23 NOTE — Telephone Encounter (Signed)
I agree, this could be colitis , pt should stop antibx and see UC or ER asap

## 2015-11-23 NOTE — Telephone Encounter (Signed)
Patient is calling about this again.

## 2016-01-05 DIAGNOSIS — E039 Hypothyroidism, unspecified: Secondary | ICD-10-CM | POA: Diagnosis not present

## 2016-01-05 DIAGNOSIS — I422 Other hypertrophic cardiomyopathy: Secondary | ICD-10-CM | POA: Diagnosis not present

## 2016-01-05 DIAGNOSIS — R0609 Other forms of dyspnea: Secondary | ICD-10-CM | POA: Diagnosis not present

## 2016-01-11 ENCOUNTER — Ambulatory Visit: Payer: BLUE CROSS/BLUE SHIELD | Admitting: Internal Medicine

## 2016-01-21 DIAGNOSIS — I10 Essential (primary) hypertension: Secondary | ICD-10-CM | POA: Diagnosis not present

## 2016-01-21 DIAGNOSIS — J019 Acute sinusitis, unspecified: Secondary | ICD-10-CM | POA: Diagnosis not present

## 2016-01-24 DIAGNOSIS — R0609 Other forms of dyspnea: Secondary | ICD-10-CM | POA: Diagnosis not present

## 2016-02-03 DIAGNOSIS — R001 Bradycardia, unspecified: Secondary | ICD-10-CM | POA: Diagnosis not present

## 2016-02-03 DIAGNOSIS — I480 Paroxysmal atrial fibrillation: Secondary | ICD-10-CM | POA: Diagnosis not present

## 2016-02-03 DIAGNOSIS — R55 Syncope and collapse: Secondary | ICD-10-CM | POA: Diagnosis not present

## 2016-02-03 DIAGNOSIS — I447 Left bundle-branch block, unspecified: Secondary | ICD-10-CM | POA: Diagnosis not present

## 2016-02-03 DIAGNOSIS — R9431 Abnormal electrocardiogram [ECG] [EKG]: Secondary | ICD-10-CM | POA: Diagnosis not present

## 2016-02-17 ENCOUNTER — Ambulatory Visit: Payer: BLUE CROSS/BLUE SHIELD | Admitting: Internal Medicine

## 2016-02-26 ENCOUNTER — Other Ambulatory Visit: Payer: Self-pay | Admitting: Internal Medicine

## 2016-02-29 NOTE — Telephone Encounter (Signed)
Done hardcopy to Anna  

## 2016-03-01 ENCOUNTER — Other Ambulatory Visit: Payer: Self-pay | Admitting: Internal Medicine

## 2016-03-01 NOTE — Telephone Encounter (Signed)
Faxed script back to [facility].../lmb 

## 2016-03-01 NOTE — Telephone Encounter (Signed)
Tylenol #3 refill not done today, since it was just done yesterday

## 2016-03-02 DIAGNOSIS — I480 Paroxysmal atrial fibrillation: Secondary | ICD-10-CM | POA: Diagnosis not present

## 2016-03-02 DIAGNOSIS — R55 Syncope and collapse: Secondary | ICD-10-CM | POA: Diagnosis not present

## 2016-03-02 DIAGNOSIS — I422 Other hypertrophic cardiomyopathy: Secondary | ICD-10-CM | POA: Diagnosis not present

## 2016-03-02 DIAGNOSIS — I421 Obstructive hypertrophic cardiomyopathy: Secondary | ICD-10-CM | POA: Diagnosis not present

## 2016-03-02 DIAGNOSIS — I10 Essential (primary) hypertension: Secondary | ICD-10-CM | POA: Diagnosis not present

## 2016-03-03 DIAGNOSIS — R55 Syncope and collapse: Secondary | ICD-10-CM | POA: Diagnosis not present

## 2016-03-09 DIAGNOSIS — I422 Other hypertrophic cardiomyopathy: Secondary | ICD-10-CM | POA: Diagnosis not present

## 2016-03-23 DIAGNOSIS — R55 Syncope and collapse: Secondary | ICD-10-CM | POA: Diagnosis not present

## 2016-03-23 DIAGNOSIS — Z9882 Breast implant status: Secondary | ICD-10-CM | POA: Diagnosis not present

## 2016-03-23 DIAGNOSIS — I421 Obstructive hypertrophic cardiomyopathy: Secondary | ICD-10-CM | POA: Diagnosis not present

## 2016-03-23 DIAGNOSIS — R0602 Shortness of breath: Secondary | ICD-10-CM | POA: Diagnosis not present

## 2016-03-23 DIAGNOSIS — R918 Other nonspecific abnormal finding of lung field: Secondary | ICD-10-CM | POA: Diagnosis not present

## 2016-03-23 DIAGNOSIS — Z853 Personal history of malignant neoplasm of breast: Secondary | ICD-10-CM | POA: Diagnosis not present

## 2016-03-23 DIAGNOSIS — Z9889 Other specified postprocedural states: Secondary | ICD-10-CM | POA: Diagnosis not present

## 2016-03-23 DIAGNOSIS — R0609 Other forms of dyspnea: Secondary | ICD-10-CM | POA: Diagnosis not present

## 2016-03-23 DIAGNOSIS — Z8543 Personal history of malignant neoplasm of ovary: Secondary | ICD-10-CM | POA: Diagnosis not present

## 2016-03-23 DIAGNOSIS — R079 Chest pain, unspecified: Secondary | ICD-10-CM | POA: Diagnosis not present

## 2016-03-24 ENCOUNTER — Other Ambulatory Visit: Payer: Self-pay | Admitting: Internal Medicine

## 2016-03-24 NOTE — Telephone Encounter (Signed)
Done hardcopy to Shirron  

## 2016-03-24 NOTE — Telephone Encounter (Signed)
[name] has faxed script back to [facility]...[name]

## 2016-03-29 NOTE — Telephone Encounter (Signed)
Rec'd call from [place] w/[facility] stating husband inform them office approved wife Tylenol #3 and was fax to them, but they never received. Called Janette/pharmacist back inform her per chart rx stated was faxed gave her MD approval to fill...[name]

## 2016-04-09 DIAGNOSIS — Z950 Presence of cardiac pacemaker: Secondary | ICD-10-CM | POA: Diagnosis not present

## 2016-04-09 DIAGNOSIS — F329 Major depressive disorder, single episode, unspecified: Secondary | ICD-10-CM | POA: Diagnosis not present

## 2016-04-09 DIAGNOSIS — I421 Obstructive hypertrophic cardiomyopathy: Secondary | ICD-10-CM | POA: Diagnosis not present

## 2016-04-09 DIAGNOSIS — R918 Other nonspecific abnormal finding of lung field: Secondary | ICD-10-CM | POA: Diagnosis not present

## 2016-04-09 DIAGNOSIS — I10 Essential (primary) hypertension: Secondary | ICD-10-CM | POA: Diagnosis not present

## 2016-04-09 DIAGNOSIS — Z9889 Other specified postprocedural states: Secondary | ICD-10-CM | POA: Diagnosis not present

## 2016-04-09 DIAGNOSIS — J9383 Other pneumothorax: Secondary | ICD-10-CM | POA: Diagnosis not present

## 2016-04-09 DIAGNOSIS — J811 Chronic pulmonary edema: Secondary | ICD-10-CM | POA: Diagnosis not present

## 2016-04-09 DIAGNOSIS — I4891 Unspecified atrial fibrillation: Secondary | ICD-10-CM | POA: Diagnosis not present

## 2016-04-09 DIAGNOSIS — E872 Acidosis: Secondary | ICD-10-CM | POA: Diagnosis not present

## 2016-04-09 DIAGNOSIS — R0902 Hypoxemia: Secondary | ICD-10-CM | POA: Diagnosis not present

## 2016-04-09 DIAGNOSIS — R768 Other specified abnormal immunological findings in serum: Secondary | ICD-10-CM | POA: Diagnosis not present

## 2016-04-09 DIAGNOSIS — J9601 Acute respiratory failure with hypoxia: Secondary | ICD-10-CM | POA: Diagnosis not present

## 2016-04-09 DIAGNOSIS — J9811 Atelectasis: Secondary | ICD-10-CM | POA: Diagnosis not present

## 2016-04-09 DIAGNOSIS — T461X5A Adverse effect of calcium-channel blockers, initial encounter: Secondary | ICD-10-CM | POA: Diagnosis not present

## 2016-04-09 DIAGNOSIS — I051 Rheumatic mitral insufficiency: Secondary | ICD-10-CM | POA: Diagnosis not present

## 2016-04-09 DIAGNOSIS — I361 Nonrheumatic tricuspid (valve) insufficiency: Secondary | ICD-10-CM | POA: Insufficient documentation

## 2016-04-09 DIAGNOSIS — I509 Heart failure, unspecified: Secondary | ICD-10-CM | POA: Diagnosis not present

## 2016-04-09 DIAGNOSIS — J939 Pneumothorax, unspecified: Secondary | ICD-10-CM | POA: Diagnosis not present

## 2016-04-09 DIAGNOSIS — Z4682 Encounter for fitting and adjustment of non-vascular catheter: Secondary | ICD-10-CM | POA: Diagnosis not present

## 2016-04-09 DIAGNOSIS — F419 Anxiety disorder, unspecified: Secondary | ICD-10-CM | POA: Diagnosis not present

## 2016-04-09 DIAGNOSIS — N179 Acute kidney failure, unspecified: Secondary | ICD-10-CM | POA: Diagnosis not present

## 2016-04-09 DIAGNOSIS — Z952 Presence of prosthetic heart valve: Secondary | ICD-10-CM | POA: Diagnosis not present

## 2016-04-09 DIAGNOSIS — G901 Familial dysautonomia [Riley-Day]: Secondary | ICD-10-CM | POA: Diagnosis not present

## 2016-04-09 DIAGNOSIS — I480 Paroxysmal atrial fibrillation: Secondary | ICD-10-CM | POA: Diagnosis not present

## 2016-04-09 DIAGNOSIS — I422 Other hypertrophic cardiomyopathy: Secondary | ICD-10-CM | POA: Diagnosis not present

## 2016-04-09 DIAGNOSIS — I340 Nonrheumatic mitral (valve) insufficiency: Secondary | ICD-10-CM | POA: Diagnosis not present

## 2016-04-09 DIAGNOSIS — E039 Hypothyroidism, unspecified: Secondary | ICD-10-CM | POA: Diagnosis not present

## 2016-04-09 DIAGNOSIS — E8779 Other fluid overload: Secondary | ICD-10-CM | POA: Diagnosis not present

## 2016-04-09 DIAGNOSIS — I110 Hypertensive heart disease with heart failure: Secondary | ICD-10-CM | POA: Diagnosis not present

## 2016-04-09 DIAGNOSIS — I5089 Other heart failure: Secondary | ICD-10-CM | POA: Diagnosis not present

## 2016-04-09 DIAGNOSIS — I442 Atrioventricular block, complete: Secondary | ICD-10-CM | POA: Diagnosis not present

## 2016-04-09 DIAGNOSIS — D62 Acute posthemorrhagic anemia: Secondary | ICD-10-CM | POA: Diagnosis not present

## 2016-04-10 DIAGNOSIS — I5089 Other heart failure: Secondary | ICD-10-CM | POA: Diagnosis not present

## 2016-04-10 DIAGNOSIS — I340 Nonrheumatic mitral (valve) insufficiency: Secondary | ICD-10-CM | POA: Diagnosis not present

## 2016-04-10 DIAGNOSIS — I422 Other hypertrophic cardiomyopathy: Secondary | ICD-10-CM | POA: Diagnosis not present

## 2016-04-10 DIAGNOSIS — I509 Heart failure, unspecified: Secondary | ICD-10-CM | POA: Diagnosis not present

## 2016-04-10 DIAGNOSIS — I480 Paroxysmal atrial fibrillation: Secondary | ICD-10-CM | POA: Diagnosis not present

## 2016-04-10 DIAGNOSIS — I421 Obstructive hypertrophic cardiomyopathy: Secondary | ICD-10-CM | POA: Diagnosis not present

## 2016-04-10 DIAGNOSIS — Z9889 Other specified postprocedural states: Secondary | ICD-10-CM | POA: Diagnosis not present

## 2016-04-10 DIAGNOSIS — Z952 Presence of prosthetic heart valve: Secondary | ICD-10-CM | POA: Diagnosis not present

## 2016-04-10 DIAGNOSIS — J811 Chronic pulmonary edema: Secondary | ICD-10-CM | POA: Diagnosis not present

## 2016-04-10 DIAGNOSIS — I4891 Unspecified atrial fibrillation: Secondary | ICD-10-CM | POA: Diagnosis not present

## 2016-04-10 DIAGNOSIS — I110 Hypertensive heart disease with heart failure: Secondary | ICD-10-CM | POA: Diagnosis not present

## 2016-04-11 DIAGNOSIS — J9601 Acute respiratory failure with hypoxia: Secondary | ICD-10-CM | POA: Diagnosis not present

## 2016-04-11 DIAGNOSIS — I422 Other hypertrophic cardiomyopathy: Secondary | ICD-10-CM | POA: Diagnosis not present

## 2016-04-11 DIAGNOSIS — J811 Chronic pulmonary edema: Secondary | ICD-10-CM | POA: Diagnosis not present

## 2016-04-11 DIAGNOSIS — N179 Acute kidney failure, unspecified: Secondary | ICD-10-CM | POA: Diagnosis not present

## 2016-04-11 DIAGNOSIS — E8779 Other fluid overload: Secondary | ICD-10-CM | POA: Diagnosis not present

## 2016-04-11 DIAGNOSIS — I442 Atrioventricular block, complete: Secondary | ICD-10-CM | POA: Diagnosis not present

## 2016-04-11 DIAGNOSIS — Z9889 Other specified postprocedural states: Secondary | ICD-10-CM | POA: Diagnosis not present

## 2016-04-11 DIAGNOSIS — D62 Acute posthemorrhagic anemia: Secondary | ICD-10-CM | POA: Insufficient documentation

## 2016-04-11 DIAGNOSIS — Z952 Presence of prosthetic heart valve: Secondary | ICD-10-CM | POA: Diagnosis not present

## 2016-04-11 DIAGNOSIS — I421 Obstructive hypertrophic cardiomyopathy: Secondary | ICD-10-CM | POA: Diagnosis not present

## 2016-04-11 DIAGNOSIS — J9811 Atelectasis: Secondary | ICD-10-CM | POA: Diagnosis not present

## 2016-04-11 HISTORY — DX: Acute kidney failure, unspecified: N17.9

## 2016-04-12 DIAGNOSIS — Z4682 Encounter for fitting and adjustment of non-vascular catheter: Secondary | ICD-10-CM | POA: Diagnosis not present

## 2016-04-12 DIAGNOSIS — G8918 Other acute postprocedural pain: Secondary | ICD-10-CM | POA: Insufficient documentation

## 2016-04-12 DIAGNOSIS — Z9889 Other specified postprocedural states: Secondary | ICD-10-CM | POA: Diagnosis not present

## 2016-04-12 DIAGNOSIS — I422 Other hypertrophic cardiomyopathy: Secondary | ICD-10-CM | POA: Diagnosis not present

## 2016-04-12 DIAGNOSIS — I442 Atrioventricular block, complete: Secondary | ICD-10-CM | POA: Diagnosis not present

## 2016-04-12 DIAGNOSIS — E8779 Other fluid overload: Secondary | ICD-10-CM | POA: Diagnosis not present

## 2016-04-12 DIAGNOSIS — I421 Obstructive hypertrophic cardiomyopathy: Secondary | ICD-10-CM | POA: Diagnosis not present

## 2016-04-12 DIAGNOSIS — J986 Disorders of diaphragm: Secondary | ICD-10-CM | POA: Insufficient documentation

## 2016-04-12 DIAGNOSIS — R918 Other nonspecific abnormal finding of lung field: Secondary | ICD-10-CM | POA: Diagnosis not present

## 2016-04-12 DIAGNOSIS — J9601 Acute respiratory failure with hypoxia: Secondary | ICD-10-CM | POA: Diagnosis not present

## 2016-04-12 DIAGNOSIS — N179 Acute kidney failure, unspecified: Secondary | ICD-10-CM | POA: Diagnosis not present

## 2016-04-13 DIAGNOSIS — J939 Pneumothorax, unspecified: Secondary | ICD-10-CM | POA: Diagnosis not present

## 2016-04-13 DIAGNOSIS — I442 Atrioventricular block, complete: Secondary | ICD-10-CM | POA: Diagnosis not present

## 2016-04-13 DIAGNOSIS — I422 Other hypertrophic cardiomyopathy: Secondary | ICD-10-CM | POA: Diagnosis not present

## 2016-04-13 DIAGNOSIS — J9811 Atelectasis: Secondary | ICD-10-CM | POA: Diagnosis not present

## 2016-04-13 DIAGNOSIS — Z9889 Other specified postprocedural states: Secondary | ICD-10-CM | POA: Diagnosis not present

## 2016-04-13 DIAGNOSIS — J811 Chronic pulmonary edema: Secondary | ICD-10-CM | POA: Diagnosis not present

## 2016-04-13 DIAGNOSIS — R918 Other nonspecific abnormal finding of lung field: Secondary | ICD-10-CM | POA: Diagnosis not present

## 2016-04-14 DIAGNOSIS — J939 Pneumothorax, unspecified: Secondary | ICD-10-CM | POA: Diagnosis not present

## 2016-04-14 DIAGNOSIS — Z952 Presence of prosthetic heart valve: Secondary | ICD-10-CM | POA: Insufficient documentation

## 2016-04-14 DIAGNOSIS — J811 Chronic pulmonary edema: Secondary | ICD-10-CM | POA: Diagnosis not present

## 2016-04-14 DIAGNOSIS — I442 Atrioventricular block, complete: Secondary | ICD-10-CM | POA: Insufficient documentation

## 2016-04-14 DIAGNOSIS — J9811 Atelectasis: Secondary | ICD-10-CM | POA: Diagnosis not present

## 2016-04-14 DIAGNOSIS — I422 Other hypertrophic cardiomyopathy: Secondary | ICD-10-CM | POA: Diagnosis not present

## 2016-04-14 DIAGNOSIS — Z9889 Other specified postprocedural states: Secondary | ICD-10-CM | POA: Diagnosis not present

## 2016-04-14 DIAGNOSIS — Z954 Presence of other heart-valve replacement: Secondary | ICD-10-CM | POA: Insufficient documentation

## 2016-04-14 HISTORY — DX: Atrioventricular block, complete: I44.2

## 2016-04-15 DIAGNOSIS — Z9889 Other specified postprocedural states: Secondary | ICD-10-CM | POA: Diagnosis not present

## 2016-04-15 DIAGNOSIS — I442 Atrioventricular block, complete: Secondary | ICD-10-CM | POA: Diagnosis not present

## 2016-04-15 DIAGNOSIS — K623 Rectal prolapse: Secondary | ICD-10-CM | POA: Insufficient documentation

## 2016-04-15 DIAGNOSIS — I422 Other hypertrophic cardiomyopathy: Secondary | ICD-10-CM | POA: Diagnosis not present

## 2016-04-16 DIAGNOSIS — I422 Other hypertrophic cardiomyopathy: Secondary | ICD-10-CM | POA: Diagnosis not present

## 2016-04-16 DIAGNOSIS — J939 Pneumothorax, unspecified: Secondary | ICD-10-CM | POA: Diagnosis not present

## 2016-04-16 DIAGNOSIS — Z9889 Other specified postprocedural states: Secondary | ICD-10-CM | POA: Diagnosis not present

## 2016-04-16 DIAGNOSIS — J9811 Atelectasis: Secondary | ICD-10-CM | POA: Diagnosis not present

## 2016-04-16 DIAGNOSIS — J811 Chronic pulmonary edema: Secondary | ICD-10-CM | POA: Diagnosis not present

## 2016-04-17 DIAGNOSIS — I442 Atrioventricular block, complete: Secondary | ICD-10-CM | POA: Diagnosis not present

## 2016-04-17 DIAGNOSIS — J939 Pneumothorax, unspecified: Secondary | ICD-10-CM | POA: Diagnosis not present

## 2016-04-17 DIAGNOSIS — J9811 Atelectasis: Secondary | ICD-10-CM | POA: Diagnosis not present

## 2016-04-17 DIAGNOSIS — J811 Chronic pulmonary edema: Secondary | ICD-10-CM | POA: Diagnosis not present

## 2016-04-17 DIAGNOSIS — Z9889 Other specified postprocedural states: Secondary | ICD-10-CM | POA: Diagnosis not present

## 2016-04-17 DIAGNOSIS — I422 Other hypertrophic cardiomyopathy: Secondary | ICD-10-CM | POA: Diagnosis not present

## 2016-04-18 DIAGNOSIS — Z950 Presence of cardiac pacemaker: Secondary | ICD-10-CM | POA: Diagnosis not present

## 2016-04-18 DIAGNOSIS — I422 Other hypertrophic cardiomyopathy: Secondary | ICD-10-CM | POA: Diagnosis not present

## 2016-04-18 DIAGNOSIS — I442 Atrioventricular block, complete: Secondary | ICD-10-CM | POA: Diagnosis not present

## 2016-04-18 DIAGNOSIS — R918 Other nonspecific abnormal finding of lung field: Secondary | ICD-10-CM | POA: Diagnosis not present

## 2016-04-19 DIAGNOSIS — J9383 Other pneumothorax: Secondary | ICD-10-CM | POA: Diagnosis not present

## 2016-04-19 DIAGNOSIS — Z7901 Long term (current) use of anticoagulants: Secondary | ICD-10-CM | POA: Insufficient documentation

## 2016-04-19 DIAGNOSIS — Z9889 Other specified postprocedural states: Secondary | ICD-10-CM | POA: Diagnosis not present

## 2016-04-19 DIAGNOSIS — R918 Other nonspecific abnormal finding of lung field: Secondary | ICD-10-CM | POA: Diagnosis not present

## 2016-04-19 DIAGNOSIS — Z4682 Encounter for fitting and adjustment of non-vascular catheter: Secondary | ICD-10-CM | POA: Diagnosis not present

## 2016-04-19 DIAGNOSIS — J9811 Atelectasis: Secondary | ICD-10-CM | POA: Diagnosis not present

## 2016-04-19 DIAGNOSIS — Z950 Presence of cardiac pacemaker: Secondary | ICD-10-CM | POA: Diagnosis not present

## 2016-04-19 DIAGNOSIS — Z952 Presence of prosthetic heart valve: Secondary | ICD-10-CM | POA: Diagnosis not present

## 2016-04-25 DIAGNOSIS — Z950 Presence of cardiac pacemaker: Secondary | ICD-10-CM

## 2016-04-25 DIAGNOSIS — I422 Other hypertrophic cardiomyopathy: Secondary | ICD-10-CM | POA: Diagnosis not present

## 2016-04-25 DIAGNOSIS — R0782 Intercostal pain: Secondary | ICD-10-CM | POA: Diagnosis not present

## 2016-04-25 DIAGNOSIS — Z954 Presence of other heart-valve replacement: Secondary | ICD-10-CM | POA: Diagnosis not present

## 2016-04-25 DIAGNOSIS — Z7901 Long term (current) use of anticoagulants: Secondary | ICD-10-CM | POA: Diagnosis not present

## 2016-04-25 HISTORY — DX: Presence of cardiac pacemaker: Z95.0

## 2016-04-27 ENCOUNTER — Other Ambulatory Visit: Payer: Self-pay | Admitting: Internal Medicine

## 2016-04-27 NOTE — Telephone Encounter (Signed)
Faxed

## 2016-04-27 NOTE — Telephone Encounter (Signed)
Done hardcopy to Shirron  

## 2016-05-02 DIAGNOSIS — Z7901 Long term (current) use of anticoagulants: Secondary | ICD-10-CM | POA: Diagnosis not present

## 2016-05-02 DIAGNOSIS — Z5181 Encounter for therapeutic drug level monitoring: Secondary | ICD-10-CM | POA: Diagnosis not present

## 2016-05-02 DIAGNOSIS — I422 Other hypertrophic cardiomyopathy: Secondary | ICD-10-CM | POA: Diagnosis not present

## 2016-05-02 DIAGNOSIS — Z952 Presence of prosthetic heart valve: Secondary | ICD-10-CM | POA: Diagnosis not present

## 2016-05-03 DIAGNOSIS — Z9889 Other specified postprocedural states: Secondary | ICD-10-CM | POA: Diagnosis not present

## 2016-05-03 DIAGNOSIS — I422 Other hypertrophic cardiomyopathy: Secondary | ICD-10-CM | POA: Diagnosis not present

## 2016-05-10 DIAGNOSIS — I480 Paroxysmal atrial fibrillation: Secondary | ICD-10-CM | POA: Diagnosis not present

## 2016-05-10 DIAGNOSIS — Z7901 Long term (current) use of anticoagulants: Secondary | ICD-10-CM | POA: Diagnosis not present

## 2016-05-10 DIAGNOSIS — J0100 Acute maxillary sinusitis, unspecified: Secondary | ICD-10-CM | POA: Diagnosis not present

## 2016-05-10 DIAGNOSIS — Z5181 Encounter for therapeutic drug level monitoring: Secondary | ICD-10-CM | POA: Diagnosis not present

## 2016-05-10 DIAGNOSIS — Z954 Presence of other heart-valve replacement: Secondary | ICD-10-CM | POA: Diagnosis not present

## 2016-05-15 ENCOUNTER — Encounter: Payer: Self-pay | Admitting: Internal Medicine

## 2016-05-16 ENCOUNTER — Ambulatory Visit (INDEPENDENT_AMBULATORY_CARE_PROVIDER_SITE_OTHER): Payer: BLUE CROSS/BLUE SHIELD | Admitting: Internal Medicine

## 2016-05-16 ENCOUNTER — Ambulatory Visit (INDEPENDENT_AMBULATORY_CARE_PROVIDER_SITE_OTHER): Payer: BLUE CROSS/BLUE SHIELD | Admitting: General Practice

## 2016-05-16 ENCOUNTER — Encounter: Payer: Self-pay | Admitting: Internal Medicine

## 2016-05-16 VITALS — BP 164/104 | HR 92 | Ht 69.0 in | Wt 214.0 lb

## 2016-05-16 DIAGNOSIS — I10 Essential (primary) hypertension: Secondary | ICD-10-CM

## 2016-05-16 DIAGNOSIS — F411 Generalized anxiety disorder: Secondary | ICD-10-CM | POA: Diagnosis not present

## 2016-05-16 DIAGNOSIS — Z7901 Long term (current) use of anticoagulants: Secondary | ICD-10-CM | POA: Diagnosis not present

## 2016-05-16 DIAGNOSIS — Z952 Presence of prosthetic heart valve: Secondary | ICD-10-CM | POA: Diagnosis not present

## 2016-05-16 DIAGNOSIS — Z5181 Encounter for therapeutic drug level monitoring: Secondary | ICD-10-CM | POA: Diagnosis not present

## 2016-05-16 HISTORY — DX: Long term (current) use of anticoagulants: Z79.01

## 2016-05-16 LAB — POCT INR: INR: 2.1

## 2016-05-16 NOTE — Assessment & Plan Note (Signed)
Needs to start with out [place] coumadin clinic, to see Rachna Schonberger today

## 2016-05-16 NOTE — Assessment & Plan Note (Signed)
F/u cardiothoracic surgury,  to f/u any worsening symptoms or concerns

## 2016-05-16 NOTE — Patient Instructions (Signed)
Pre visit review using our clinic review tool, if applicable. No additional management support is needed unless otherwise documented below in the visit note. 

## 2016-05-16 NOTE — Progress Notes (Signed)
Subjective:    Patient ID: [name], female    DOB: 1955/02/09, 61 y.o.   MRN: [id number]  HPI  Here to f/u; now approx 6 wks post cardiac surgury involving septoplasty, MVR with metal valve now on chronic coumadin, as well as PPM placement.  INR was 2.6 at last evaluation per Carlye Grippe [place] MD and coumadin reduced, with f/u INR 1.6 per husband.  They will never go back   Current dose is 6 mg qhs (3 x 2mg  tabs).  No overt bleeding but goal INR is 2.5 - 3.5.  No overt bleeding issues.  Asking for INR today and long term plan, and willing to drive 90 minutes each way to accomplish this, with the goal being once per month. Pt denies worsening chest pain, increased sob or doe, wheezing, orthopnea, PND, increased LE swelling, palpitations, dizziness or syncope.  Has healing surgical wound mid chest with some soreness only, and doesn't like the scar.  Has some bilat ear fullness, but no fever, ST, cough. Also with BP elevated today, but has not been recently, states she thinks related to stress over the current situation  Wt overall stable Denies worsening depressive symptoms, suicidal ideation, or panic; has ongoing anxiety, some situationally worse recently Wt Readings from Last 3 Encounters:  05/16/16 214 lb (97.1 kg)  11/21/12 213 lb (96.6 kg)  11/12/12 210 lb 1.9 oz (95.3 kg)   Past Medical History:  Diagnosis Date  . Abdominal pain, epigastric 10/25/2007  . ADD 01/20/2009  . ALLERGIC RHINITIS 10/15/2006  . ANGIOEDEMA 08/12/2008  . ANXIETY 10/15/2006  . ASTHMATIC BRONCHITIS, ACUTE 01/27/2008  . Cellulitis and abscess of leg, except foot 08/12/2007  . CELLULITIS, [place] 12/21/2008  . CHEST PAIN 06/14/2007  . Chronic anticoagulation 05/16/2016  . Chronic sinus infection 04/12/2010  . COLECTOMY, PARTIAL, WITH ANASTOMOSIS, HX OF 10/15/2006  . COLONIC POLYPS, HX OF 10/15/2006  . CYST, OVARIAN NEC/NOS 10/15/2006  . Dengue 02/22/2010  . DVT, HX OF 10/15/2006  . DYSAUTONOMIA 10/15/2006  .  Dysfunction of eustachian tube 05/26/2009  . GI BLEEDING 04/04/2007  . Glossitis 03/06/2008  . HEADACHE, CHRONIC 04/04/2007  . Hematemesis 10/25/2007  . HEMORRHOIDS 04/04/2007  . HYPERLIPIDEMIA 12/12/2007  . HYPERTENSION 10/15/2006  . HYPOTHYROIDISM 03/06/2008  . IBS 04/04/2007  . MASTECTOMY, BILATERAL, HX OF 08/12/2009  . NECK MASS 07/01/2007  . NEOP, MALIGNANT, FEMALE BREAST NOS 10/15/2006  . 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 SYNCOPE 07/01/2007  . Wheezing 08/20/2008  . WOUND, OPEN, LEG, WITHOUT COMPLICATION 06/28/9483   Past Surgical History:  Procedure Laterality Date  . ABDOMINAL HYSTERECTOMY    . bilateral mastectomy/reconstruction    . CESAREAN SECTION    . CHOLECYSTECTOMY    . COLONOSCOPY W/ BIOPSIES    . FLEXIBLE SIGMOIDOSCOPY    . LEFT AND RIGHT HEART CATHETERIZATION WITH CORONARY ANGIOGRAM N/A 10/07/2012   Procedure: LEFT AND RIGHT HEART CATHETERIZATION WITH CORONARY ANGIOGRAM;  Surgeon: Jolaine Artist, MD;  Location: [facility] CATH LAB;  Service: Cardiovascular;  Laterality: N/A;  . multiple ablations    . OOPHORECTOMY    . spigellian hernia      reports that she has never smoked. She has never used smokeless tobacco. She reports that she does not drink alcohol or use drugs. family history includes Cancer in her sister. Allergies  Allergen Reactions  . Fish Allergy Swelling  . Latex Dermatitis    More than a day causes blisters   . Penicillins     REACTION:  shock   Current Outpatient Prescriptions on File Prior to Visit  Medication Sig Dispense Refill  . acetaminophen-codeine (TYLENOL #3) 300-30 MG tablet TAKE 1 TABLET BY MOUTH FOUR TIMES DAILY AS NEEDED FOR PAIN 120 tablet 0  . cetirizine (ZYRTEC) 10 MG tablet Take 1 tablet (10 mg total) by mouth daily. 30 tablet 11  . levothyroxine (SYNTHROID, LEVOTHROID) 50 MCG tablet TAKE ONE TABLET EVERY DAY BEFORE BEFORE BREAKFAST 90 tablet 4  . metoprolol succinate (TOPROL-XL) 50 MG 24 hr tablet Take 1 tablet (50 mg total) by mouth  daily. Take 1 by mouth daily 90 tablet 1  . triamcinolone (NASACORT AQ) 55 MCG/ACT AERO nasal inhaler Place 2 sprays into the nose daily. 1 Inhaler 12  . venlafaxine XR (EFFEXOR-XR) 150 MG 24 hr capsule Take 1 capsule (150 mg total) by mouth daily. Overdue for yearly physical w/labs must see MD for refills 30 capsule 0  . zolpidem (AMBIEN) 10 MG tablet TAKE 1 TABLET BY MOUTH EVERY DAY AT BEDTIME AS NEEDED 90 tablet 1   No current facility-administered medications on file prior to visit.    Review of Systems  Constitutional: Negative for other unusual diaphoresis or sweats HENT: Negative for ear discharge or swelling Eyes: Negative for other worsening visual disturbances Respiratory: Negative for stridor or other swelling  Gastrointestinal: Negative for worsening distension or other blood Genitourinary: Negative for retention or other urinary change Musculoskeletal: Negative for other MSK pain or swelling Skin: Negative for color change or other new lesions Neurological: Negative for worsening tremors and other numbness  Psychiatric/Behavioral: Negative for worsening agitation or other fatigue All other system neg per pt    Objective:   Physical Exam BP (!) 164/104   Pulse 92   Ht 5\' 9"  (1.753 m)   Wt 214 lb (97.1 kg)   SpO2 99%   BMI 31.60 kg/m  VS noted,  Constitutional: Pt is oriented to person, place, and time. Appears well-developed and well-nourished, in no significant distress and comfortable Head: Normocephalic and atraumatic  Eyes: Conjunctivae and EOM are normal. Pupils are equal, round, and reactive to light Right Ear: External ear normal without discharge Left Ear: External ear normal without discharge Nose: Nose without discharge or deformity Mouth/Throat: Oropharynx is without other ulcerations and moist  Neck: Normal range of motion. Neck supple. No JVD present. No tracheal deviation present or significant neck LA or mass Cardiovascular: Normal rate, regular  rhythm, normal heart sounds and intact distal pulses.   Pulmonary/Chest: WOB normal and breath sounds without rales or wheezing  Abdominal: Soft. Bowel sounds are normal. NT. No HSM  Musculoskeletal: Normal range of motion. Exhibits no edema Lymphadenopathy: Has no other cervical adenopathy.  Neurological: Pt is alert and oriented to person, place, and time. Pt has normal reflexes. No cranial nerve deficit. Motor grossly intact, Gait intact Skin: Skin is warm and dry. No rash noted or new ulcerations, mid chest surgical scar healing mild tender, left upper chest PPM pocket without swelling, tender and incision intact Psychiatric:  Has normal mood and affect. Behavior is normal without agitation No other exam findings    Assessment & Plan:

## 2016-05-16 NOTE — Progress Notes (Signed)
I have reviewed and agree with the plan. 

## 2016-05-16 NOTE — Patient Instructions (Addendum)
Please continue all other medications as before, and refills have been done if requested.  Please have the pharmacy call with any other refills you may need.  Please continue your efforts at being more active, low cholesterol diet, and weight control.  Please keep your appointments with your specialists as you may have planned  Please see Villa Herb ,RN at the [facility] Coumadin clinic to continue your coumadin testing with goal 2.5-3.5 - today  Please return in 6 months, or sooner if needed

## 2016-05-16 NOTE — Assessment & Plan Note (Signed)
Mod elevated, declines further change in tx at this time, cont same med tx, plans to f/u [facility] cardiology

## 2016-05-16 NOTE — Assessment & Plan Note (Signed)
Mild situationally worse recently, declines change in tx

## 2016-05-24 ENCOUNTER — Ambulatory Visit: Payer: BLUE CROSS/BLUE SHIELD | Admitting: Internal Medicine

## 2016-05-24 ENCOUNTER — Ambulatory Visit: Payer: BLUE CROSS/BLUE SHIELD

## 2016-05-25 ENCOUNTER — Ambulatory Visit (INDEPENDENT_AMBULATORY_CARE_PROVIDER_SITE_OTHER): Payer: BLUE CROSS/BLUE SHIELD | Admitting: General Practice

## 2016-05-25 ENCOUNTER — Other Ambulatory Visit: Payer: Self-pay | Admitting: General Practice

## 2016-05-25 DIAGNOSIS — Z5181 Encounter for therapeutic drug level monitoring: Secondary | ICD-10-CM

## 2016-05-25 LAB — POCT INR: INR: 2.5

## 2016-05-25 MED ORDER — WARFARIN SODIUM 6 MG PO TABS: ORAL_TABLET | ORAL | 3 refills | Status: DC

## 2016-05-25 MED ORDER — WARFARIN SODIUM 2 MG PO TABS: ORAL_TABLET | ORAL | 0 refills | Status: DC

## 2016-05-25 NOTE — Progress Notes (Signed)
I have reviewed and agree with the plan. 

## 2016-05-25 NOTE — Patient Instructions (Signed)
Pre visit review using our clinic review tool, if applicable. No additional management support is needed unless otherwise documented below in the visit note. 

## 2016-05-26 ENCOUNTER — Ambulatory Visit: Payer: BLUE CROSS/BLUE SHIELD

## 2016-05-31 ENCOUNTER — Other Ambulatory Visit: Payer: Self-pay | Admitting: Internal Medicine

## 2016-05-31 NOTE — Telephone Encounter (Signed)
Done hardcopy to Shirron  

## 2016-05-31 NOTE — Telephone Encounter (Signed)
faxed

## 2016-06-07 ENCOUNTER — Ambulatory Visit: Payer: BLUE CROSS/BLUE SHIELD

## 2016-06-09 ENCOUNTER — Ambulatory Visit (INDEPENDENT_AMBULATORY_CARE_PROVIDER_SITE_OTHER): Payer: BLUE CROSS/BLUE SHIELD | Admitting: General Practice

## 2016-06-09 DIAGNOSIS — Z5181 Encounter for therapeutic drug level monitoring: Secondary | ICD-10-CM

## 2016-06-09 LAB — POCT INR: INR: 2.6

## 2016-06-09 NOTE — Progress Notes (Signed)
I have reviewed and agree with the plan. 

## 2016-06-09 NOTE — Patient Instructions (Signed)
Pre visit review using our clinic review tool, if applicable. No additional management support is needed unless otherwise documented below in the visit note. 

## 2016-06-16 ENCOUNTER — Telehealth: Payer: Self-pay | Admitting: Internal Medicine

## 2016-06-16 NOTE — Telephone Encounter (Signed)
Pt husband called in and needs refill on her her warfarin (COUMADIN) 2 MG tablet [[id number]]   Pharmacy - [facility]

## 2016-06-19 ENCOUNTER — Other Ambulatory Visit: Payer: Self-pay | Admitting: General Practice

## 2016-06-19 MED ORDER — WARFARIN SODIUM 2 MG PO TABS: ORAL_TABLET | ORAL | 0 refills | Status: DC

## 2016-06-27 DIAGNOSIS — Z48812 Encounter for surgical aftercare following surgery on the circulatory system: Secondary | ICD-10-CM | POA: Diagnosis not present

## 2016-06-27 DIAGNOSIS — Z952 Presence of prosthetic heart valve: Secondary | ICD-10-CM | POA: Diagnosis not present

## 2016-06-28 ENCOUNTER — Other Ambulatory Visit: Payer: Self-pay | Admitting: Internal Medicine

## 2016-06-28 NOTE — Telephone Encounter (Signed)
Done erx 

## 2016-06-28 NOTE — Telephone Encounter (Signed)
Faxed

## 2016-06-28 NOTE — Telephone Encounter (Signed)
Done hardcopy to Shirron  

## 2016-06-29 DIAGNOSIS — I422 Other hypertrophic cardiomyopathy: Secondary | ICD-10-CM | POA: Diagnosis not present

## 2016-06-29 DIAGNOSIS — I480 Paroxysmal atrial fibrillation: Secondary | ICD-10-CM | POA: Diagnosis not present

## 2016-06-29 DIAGNOSIS — Z954 Presence of other heart-valve replacement: Secondary | ICD-10-CM | POA: Diagnosis not present

## 2016-06-29 DIAGNOSIS — J9811 Atelectasis: Secondary | ICD-10-CM | POA: Diagnosis not present

## 2016-06-29 DIAGNOSIS — R918 Other nonspecific abnormal finding of lung field: Secondary | ICD-10-CM | POA: Diagnosis not present

## 2016-06-29 DIAGNOSIS — I10 Essential (primary) hypertension: Secondary | ICD-10-CM | POA: Diagnosis not present

## 2016-06-29 DIAGNOSIS — R0602 Shortness of breath: Secondary | ICD-10-CM | POA: Diagnosis not present

## 2016-06-29 DIAGNOSIS — J811 Chronic pulmonary edema: Secondary | ICD-10-CM | POA: Diagnosis not present

## 2016-07-04 ENCOUNTER — Ambulatory Visit (INDEPENDENT_AMBULATORY_CARE_PROVIDER_SITE_OTHER): Payer: BLUE CROSS/BLUE SHIELD | Admitting: General Practice

## 2016-07-04 DIAGNOSIS — Z5181 Encounter for therapeutic drug level monitoring: Secondary | ICD-10-CM | POA: Diagnosis not present

## 2016-07-04 NOTE — Patient Instructions (Signed)
Pre visit review using our clinic review tool, if applicable. No additional management support is needed unless otherwise documented below in the visit note. 

## 2016-07-06 ENCOUNTER — Ambulatory Visit (INDEPENDENT_AMBULATORY_CARE_PROVIDER_SITE_OTHER): Payer: BLUE CROSS/BLUE SHIELD | Admitting: General Practice

## 2016-07-06 DIAGNOSIS — Z5181 Encounter for therapeutic drug level monitoring: Secondary | ICD-10-CM

## 2016-07-06 DIAGNOSIS — R079 Chest pain, unspecified: Secondary | ICD-10-CM | POA: Diagnosis not present

## 2016-07-06 DIAGNOSIS — I421 Obstructive hypertrophic cardiomyopathy: Secondary | ICD-10-CM | POA: Diagnosis not present

## 2016-07-06 DIAGNOSIS — I422 Other hypertrophic cardiomyopathy: Secondary | ICD-10-CM | POA: Diagnosis not present

## 2016-07-06 DIAGNOSIS — Z954 Presence of other heart-valve replacement: Secondary | ICD-10-CM | POA: Diagnosis not present

## 2016-07-06 LAB — POCT INR: INR: 2.7

## 2016-07-06 NOTE — Progress Notes (Signed)
I have reviewed and agree with the plan. 

## 2016-07-06 NOTE — Patient Instructions (Signed)
Pre visit review using our clinic review tool, if applicable. No additional management support is needed unless otherwise documented below in the visit note. 

## 2016-07-07 ENCOUNTER — Ambulatory Visit: Payer: BLUE CROSS/BLUE SHIELD

## 2016-07-07 DIAGNOSIS — Z45018 Encounter for adjustment and management of other part of cardiac pacemaker: Secondary | ICD-10-CM | POA: Diagnosis not present

## 2016-07-07 DIAGNOSIS — Z950 Presence of cardiac pacemaker: Secondary | ICD-10-CM | POA: Diagnosis not present

## 2016-07-07 DIAGNOSIS — R0602 Shortness of breath: Secondary | ICD-10-CM | POA: Diagnosis not present

## 2016-07-11 DIAGNOSIS — Z48812 Encounter for surgical aftercare following surgery on the circulatory system: Secondary | ICD-10-CM | POA: Diagnosis not present

## 2016-07-11 DIAGNOSIS — Z952 Presence of prosthetic heart valve: Secondary | ICD-10-CM | POA: Diagnosis not present

## 2016-07-13 DIAGNOSIS — Z48812 Encounter for surgical aftercare following surgery on the circulatory system: Secondary | ICD-10-CM | POA: Diagnosis not present

## 2016-07-13 DIAGNOSIS — Z952 Presence of prosthetic heart valve: Secondary | ICD-10-CM | POA: Diagnosis not present

## 2016-07-14 DIAGNOSIS — Z48812 Encounter for surgical aftercare following surgery on the circulatory system: Secondary | ICD-10-CM | POA: Diagnosis not present

## 2016-07-14 DIAGNOSIS — Z952 Presence of prosthetic heart valve: Secondary | ICD-10-CM | POA: Diagnosis not present

## 2016-07-19 ENCOUNTER — Ambulatory Visit: Payer: BLUE CROSS/BLUE SHIELD

## 2016-07-25 ENCOUNTER — Other Ambulatory Visit: Payer: Self-pay | Admitting: Internal Medicine

## 2016-07-25 NOTE — Telephone Encounter (Signed)
Done hardcopy to Shirron  

## 2016-07-25 NOTE — Telephone Encounter (Signed)
Faxed

## 2016-07-26 ENCOUNTER — Other Ambulatory Visit: Payer: Self-pay | Admitting: General Practice

## 2016-07-26 ENCOUNTER — Ambulatory Visit (INDEPENDENT_AMBULATORY_CARE_PROVIDER_SITE_OTHER): Payer: BLUE CROSS/BLUE SHIELD | Admitting: General Practice

## 2016-07-26 DIAGNOSIS — Z5181 Encounter for therapeutic drug level monitoring: Secondary | ICD-10-CM | POA: Diagnosis not present

## 2016-07-26 LAB — POCT INR: INR: 1.9

## 2016-07-26 MED ORDER — WARFARIN SODIUM 7.5 MG PO TABS: 7.5000 mg | ORAL_TABLET | Freq: Every day | ORAL | 1 refills | Status: DC

## 2016-07-26 NOTE — Progress Notes (Signed)
I have reviewed and agree with the plan. 

## 2016-07-26 NOTE — Patient Instructions (Signed)
Pre visit review using our clinic review tool, if applicable. No additional management support is needed unless otherwise documented below in the visit note. 

## 2016-08-16 ENCOUNTER — Ambulatory Visit (INDEPENDENT_AMBULATORY_CARE_PROVIDER_SITE_OTHER): Payer: BLUE CROSS/BLUE SHIELD | Admitting: General Practice

## 2016-08-16 DIAGNOSIS — Z5181 Encounter for therapeutic drug level monitoring: Secondary | ICD-10-CM

## 2016-08-16 LAB — POCT INR: INR: 2.2

## 2016-08-16 NOTE — Progress Notes (Signed)
I have reviewed and agree with the plan. 

## 2016-08-16 NOTE — Patient Instructions (Signed)
Pre visit review using our clinic review tool, if applicable. No additional management support is needed unless otherwise documented below in the visit note. 

## 2016-08-25 ENCOUNTER — Telehealth: Payer: Self-pay | Admitting: Internal Medicine

## 2016-08-25 MED ORDER — ACETAMINOPHEN-CODEINE #3 300-30 MG PO TABS: 1.0000 | ORAL_TABLET | Freq: Four times a day (QID) | ORAL | 0 refills | Status: DC | PRN

## 2016-08-25 NOTE — Telephone Encounter (Signed)
Rx faxed

## 2016-08-25 NOTE — Telephone Encounter (Signed)
Patient has called in stating that [facility] in [place].  [place] has tried requesting refill of tylenol 3 this week.  Patient states she is down to two pills.  Is requesting to be sent today.

## 2016-08-25 NOTE — Telephone Encounter (Signed)
Done hardcopy to Shirron  

## 2016-08-29 ENCOUNTER — Ambulatory Visit (INDEPENDENT_AMBULATORY_CARE_PROVIDER_SITE_OTHER): Payer: BLUE CROSS/BLUE SHIELD | Admitting: General Practice

## 2016-08-29 ENCOUNTER — Ambulatory Visit (INDEPENDENT_AMBULATORY_CARE_PROVIDER_SITE_OTHER): Payer: BLUE CROSS/BLUE SHIELD | Admitting: Internal Medicine

## 2016-08-29 ENCOUNTER — Encounter: Payer: Self-pay | Admitting: Internal Medicine

## 2016-08-29 VITALS — BP 142/88 | HR 82 | Temp 98.3°F | Ht 69.0 in

## 2016-08-29 DIAGNOSIS — Z5181 Encounter for therapeutic drug level monitoring: Secondary | ICD-10-CM

## 2016-08-29 DIAGNOSIS — R220 Localized swelling, mass and lump, head: Secondary | ICD-10-CM

## 2016-08-29 DIAGNOSIS — R51 Headache: Secondary | ICD-10-CM | POA: Diagnosis not present

## 2016-08-29 DIAGNOSIS — R519 Headache, unspecified: Secondary | ICD-10-CM | POA: Insufficient documentation

## 2016-08-29 DIAGNOSIS — R040 Epistaxis: Secondary | ICD-10-CM | POA: Insufficient documentation

## 2016-08-29 DIAGNOSIS — J328 Other chronic sinusitis: Secondary | ICD-10-CM | POA: Diagnosis not present

## 2016-08-29 LAB — POCT INR: INR: 3

## 2016-08-29 NOTE — Progress Notes (Signed)
Subjective:    Patient ID: [name], female    DOB: May 19, 1955, 61 y.o.   MRN: [id number]  HPI   Here to f/u, unfortunately with recurring bilat nosebleed several times in the past week on coumadin with recurrent onset mild bilat sinus pain, pressure, low grade temp, recurring HA's of unclear etiology, and sinus pain.  Pain overall is dull, pressure like, not really throbbing, no visual blurred or vomiting. PPM change to resuce mode as heart back to working with spontn electrical activity  Has had recurring nosebleed on coumadin, not today and no fever, worsening pain.   Also having daily mod to severe frontal headaches, with visual changes like wavy lines, has some nausae but no vomiting, tends to last one hour.  Has not tried nsaid as is on coumadin.   INR 3/0 today  No other overt bleeding Past Medical History:  Diagnosis Date  . Abdominal pain, epigastric 10/25/2007  . ADD 01/20/2009  . ALLERGIC RHINITIS 10/15/2006  . ANGIOEDEMA 08/12/2008  . ANXIETY 10/15/2006  . ASTHMATIC BRONCHITIS, ACUTE 01/27/2008  . Cellulitis and abscess of leg, except foot 08/12/2007  . CELLULITIS, [place] 12/21/2008  . CHEST PAIN 06/14/2007  . Chronic anticoagulation 05/16/2016  . Chronic sinus infection 04/12/2010  . COLECTOMY, PARTIAL, WITH ANASTOMOSIS, HX OF 10/15/2006  . COLONIC POLYPS, HX OF 10/15/2006  . CYST, OVARIAN NEC/NOS 10/15/2006  . Dengue 02/22/2010  . DVT, HX OF 10/15/2006  . DYSAUTONOMIA 10/15/2006  . Dysfunction of eustachian tube 05/26/2009  . GI BLEEDING 04/04/2007  . Glossitis 03/06/2008  . HEADACHE, CHRONIC 04/04/2007  . Hematemesis 10/25/2007  . HEMORRHOIDS 04/04/2007  . HYPERLIPIDEMIA 12/12/2007  . HYPERTENSION 10/15/2006  . HYPOTHYROIDISM 03/06/2008  . IBS 04/04/2007  . MASTECTOMY, BILATERAL, HX OF 08/12/2009  . NECK MASS 07/01/2007  . NEOP, MALIGNANT, FEMALE BREAST NOS 10/15/2006  . 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 SYNCOPE 07/01/2007  . Wheezing 08/20/2008  . WOUND, OPEN, LEG, WITHOUT  COMPLICATION 04/10/8117   Past Surgical History:  Procedure Laterality Date  . ABDOMINAL HYSTERECTOMY    . bilateral mastectomy/reconstruction    . CESAREAN SECTION    . CHOLECYSTECTOMY    . COLONOSCOPY W/ BIOPSIES    . FLEXIBLE SIGMOIDOSCOPY    . LEFT AND RIGHT HEART CATHETERIZATION WITH CORONARY ANGIOGRAM N/A 10/07/2012   Procedure: LEFT AND RIGHT HEART CATHETERIZATION WITH CORONARY ANGIOGRAM;  Surgeon: Jolaine Artist, MD;  Location: [facility] CATH LAB;  Service: Cardiovascular;  Laterality: N/A;  . multiple ablations    . OOPHORECTOMY    . spigellian hernia      reports that she has never smoked. She has never used smokeless tobacco. She reports that she does not drink alcohol or use drugs. family history includes Cancer in her sister. Allergies  Allergen Reactions  . Fish Allergy Swelling  . Latex Dermatitis    More than a day causes blisters   . Penicillins     REACTION: shock   Current Outpatient Prescriptions on File Prior to Visit  Medication Sig Dispense Refill  . acetaminophen-codeine (TYLENOL #3) 300-30 MG tablet Take 1 tablet by mouth 4 (four) times daily as needed. for pain 120 tablet 0  . cetirizine (ZYRTEC) 10 MG tablet Take 1 tablet (10 mg total) by mouth daily. 30 tablet 11  . levothyroxine (SYNTHROID, LEVOTHROID) 50 MCG tablet TAKE ONE TABLET EVERY DAY BEFORE BEFORE BREAKFAST 90 tablet 4  . metoprolol succinate (TOPROL-XL) 50 MG 24 hr tablet Take 1 tablet (50 mg total)  by mouth daily. Take 1 by mouth daily 90 tablet 1  . triamcinolone (NASACORT AQ) 55 MCG/ACT AERO nasal inhaler Place 2 sprays into the nose daily. 1 Inhaler 12  . venlafaxine XR (EFFEXOR-XR) 150 MG 24 hr capsule Take 1 capsule (150 mg total) by mouth daily. Overdue for yearly physical w/labs must see MD for refills 30 capsule 0  . venlafaxine XR (EFFEXOR-XR) 150 MG 24 hr capsule TAKE ONE CAPSULE DAILY 90 capsule 3  . warfarin (COUMADIN) 7.5 MG tablet Take 1 tablet (7.5 mg total) by mouth daily. 90  tablet 1  . zolpidem (AMBIEN) 10 MG tablet TAKE 1 TABLET BY MOUTH EVERY DAY AT BEDTIME AS NEEDED 90 tablet 1   No current facility-administered medications on file prior to visit.    Review of Systems  Constitutional: Negative for other unusual diaphoresis or sweats HENT: Negative for ear discharge or swelling Eyes: Negative for other worsening visual disturbances Respiratory: Negative for stridor or other swelling  Gastrointestinal: Negative for worsening distension or other blood Genitourinary: Negative for retention or other urinary change Musculoskeletal: Negative for other MSK pain or swelling Skin: Negative for color change or other new lesions Neurological: Negative for worsening tremors and other numbness  Psychiatric/Behavioral: Negative for worsening agitation or other fatigue All other system neg per pt    Objective:   Physical Exam BP (!) 142/88   Pulse 82   Temp 98.3 F (36.8 C)   Ht 5\' 9"  (1.753 m)   SpO2 98%  VS noted, non toxic Constitutional: Pt appears in NAD HENT: Head: NCAT.  Right Ear: External ear normal.  Left Ear: External ear normal Eyes: . Pupils are equal, round, and reactive to light. Conjunctivae and EOM are normal Nose: without d/c or deformity, no swelling or bleeding Neck: Neck supple. Gross normal ROM Cardiovascular: Normal rate and regular rhythm.   Pulmonary/Chest: Effort normal and breath sounds without rales or wheezing.  Neurological: Pt is alert. At baseline orientation, motor grossly intact Skin: Skin is warm. No rashes, other new lesions, no LE edema Psychiatric: Pt behavior is normal without agitation  No other exam findings    Assessment & Plan:

## 2016-08-29 NOTE — Patient Instructions (Addendum)
Please continue all other medications as before, and refills have been done if requested.  Please have the pharmacy call with any other refills you may need.  Please continue your efforts at being more active, low cholesterol diet, and weight control.  You are otherwise up to date with prevention measures today.  Please keep your appointments with your specialists as you may have planned  You will be contacted regarding the referral for: Neurology, and ENT  You will be contacted regarding the referral for: CT head and sinus  Please return in 6 months, or sooner if needed

## 2016-08-29 NOTE — Patient Instructions (Signed)
Pre visit review using our clinic review tool, if applicable. No additional management support is needed unless otherwise documented below in the visit note. 

## 2016-08-30 ENCOUNTER — Ambulatory Visit: Payer: BLUE CROSS/BLUE SHIELD

## 2016-08-30 ENCOUNTER — Encounter: Payer: Self-pay | Admitting: Neurology

## 2016-08-30 NOTE — Progress Notes (Signed)
I have reviewed and agree with the plan. 

## 2016-08-31 NOTE — Assessment & Plan Note (Signed)
?   Sinus vs migraine, for head CT, CT sinus and neurology consult per pt request

## 2016-08-31 NOTE — Assessment & Plan Note (Signed)
Recurrent, for ENT referral

## 2016-08-31 NOTE — Assessment & Plan Note (Signed)
For ENT referral,  to f/u any worsening symptoms or concerns 

## 2016-09-12 ENCOUNTER — Other Ambulatory Visit: Payer: Self-pay | Admitting: Internal Medicine

## 2016-09-12 DIAGNOSIS — J3489 Other specified disorders of nose and nasal sinuses: Secondary | ICD-10-CM

## 2016-09-13 ENCOUNTER — Ambulatory Visit
Admission: RE | Admit: 2016-09-13 | Discharge: 2016-09-13 | Disposition: A | Discharge status: Home / Self Care | Payer: BLUE CROSS/BLUE SHIELD | Source: Ambulatory Visit | Attending: Internal Medicine | Admitting: Internal Medicine

## 2016-09-13 DIAGNOSIS — R51 Headache: Principal | ICD-10-CM

## 2016-09-13 DIAGNOSIS — J3489 Other specified disorders of nose and nasal sinuses: Secondary | ICD-10-CM

## 2016-09-13 DIAGNOSIS — R519 Headache, unspecified: Secondary | ICD-10-CM

## 2016-09-19 ENCOUNTER — Telehealth: Payer: Self-pay | Admitting: Internal Medicine

## 2016-09-19 NOTE — Telephone Encounter (Signed)
Done hardcopy to Shirron  

## 2016-09-19 NOTE — Telephone Encounter (Signed)
Faxed

## 2016-09-20 DIAGNOSIS — G43B0 Ophthalmoplegic migraine, not intractable: Secondary | ICD-10-CM | POA: Diagnosis not present

## 2016-09-21 ENCOUNTER — Encounter: Payer: Self-pay | Admitting: Internal Medicine

## 2016-09-21 NOTE — Telephone Encounter (Signed)
The pharmacy has not received this. Can it be resent to [facility] in [place].

## 2016-09-21 NOTE — Telephone Encounter (Signed)
Done

## 2016-09-27 ENCOUNTER — Ambulatory Visit: Payer: BLUE CROSS/BLUE SHIELD

## 2016-09-29 ENCOUNTER — Ambulatory Visit: Payer: BLUE CROSS/BLUE SHIELD

## 2016-10-23 ENCOUNTER — Other Ambulatory Visit: Payer: Self-pay | Admitting: Internal Medicine

## 2016-10-23 ENCOUNTER — Telehealth: Payer: Self-pay | Admitting: Internal Medicine

## 2016-10-23 DIAGNOSIS — Z79899 Other long term (current) drug therapy: Secondary | ICD-10-CM

## 2016-10-23 NOTE — Telephone Encounter (Signed)
Pt husband called in and would like to know if is pt could get refill on her  acetaminophen-codeine (TYLENOL #3) 300-30 MG tablet [[id number]]

## 2016-10-24 ENCOUNTER — Other Ambulatory Visit: Payer: BLUE CROSS/BLUE SHIELD

## 2016-10-24 DIAGNOSIS — Z79899 Other long term (current) drug therapy: Secondary | ICD-10-CM

## 2016-10-24 MED ORDER — ACETAMINOPHEN-CODEINE #3 300-30 MG PO TABS: 1.0000 | ORAL_TABLET | Freq: Four times a day (QID) | ORAL | 0 refills | Status: DC | PRN

## 2016-10-24 NOTE — Telephone Encounter (Signed)
LVM for pt to pick up prescription at the front desk.   Staff FYI: Per Dr. Sharlet Salina pt needs a UDS and must have that completed in the lab PRIOR to her receiving script. Lab has been ordered.

## 2016-10-24 NOTE — Telephone Encounter (Signed)
Printed, needs pickup with UDS

## 2016-10-27 ENCOUNTER — Telehealth: Payer: Self-pay

## 2016-10-27 ENCOUNTER — Other Ambulatory Visit: Payer: BLUE CROSS/BLUE SHIELD

## 2016-10-27 ENCOUNTER — Ambulatory Visit (INDEPENDENT_AMBULATORY_CARE_PROVIDER_SITE_OTHER): Payer: BLUE CROSS/BLUE SHIELD | Admitting: General Practice

## 2016-10-27 DIAGNOSIS — Z5181 Encounter for therapeutic drug level monitoring: Secondary | ICD-10-CM | POA: Diagnosis not present

## 2016-10-27 DIAGNOSIS — R52 Pain, unspecified: Secondary | ICD-10-CM

## 2016-10-27 LAB — POCT INR: INR: 2.5

## 2016-10-27 NOTE — Patient Instructions (Signed)
Pre visit review using our clinic review tool, if applicable. No additional management support is needed unless otherwise documented below in the visit note. 

## 2016-10-27 NOTE — Telephone Encounter (Signed)
error 

## 2016-11-02 LAB — PAIN MGMT, PROFILE 8 W/CONF, U
6 Acetylmorphine: NEGATIVE ng/mL (ref <10)
ALCOHOL METABOLITES: NEGATIVE ng/mL (ref <500)
Amphetamines: NEGATIVE ng/mL (ref <500)
Benzodiazepines: NEGATIVE ng/mL (ref <100)
Buprenorphine, Urine: NEGATIVE ng/mL (ref <5)
COCAINE METABOLITE: NEGATIVE ng/mL (ref <150)
CODEINE: 10379 ng/mL — AB (ref <50)
CREATININE: 27.6 mg/dL
HYDROMORPHONE: NEGATIVE ng/mL (ref <50)
Hydrocodone: NEGATIVE ng/mL (ref <50)
MARIJUANA METABOLITE: NEGATIVE ng/mL (ref <20)
MDMA: NEGATIVE ng/mL (ref <500)
MORPHINE: 435 ng/mL — AB (ref <50)
Norhydrocodone: NEGATIVE ng/mL (ref <50)
OXIDANT: NEGATIVE ug/mL (ref <200)
OXYCODONE: NEGATIVE ng/mL (ref <100)
Opiates: POSITIVE ng/mL — AB (ref <100)
PH: 6.35 (ref 4.5–9.0)

## 2016-11-14 ENCOUNTER — Ambulatory Visit (INDEPENDENT_AMBULATORY_CARE_PROVIDER_SITE_OTHER): Payer: BLUE CROSS/BLUE SHIELD | Admitting: Nurse Practitioner

## 2016-11-14 ENCOUNTER — Other Ambulatory Visit: Payer: BLUE CROSS/BLUE SHIELD

## 2016-11-14 ENCOUNTER — Encounter: Payer: Self-pay | Admitting: Nurse Practitioner

## 2016-11-14 VITALS — BP 144/90 | HR 78 | Temp 98.2°F | Ht 69.0 in

## 2016-11-14 DIAGNOSIS — K580 Irritable bowel syndrome with diarrhea: Secondary | ICD-10-CM | POA: Diagnosis not present

## 2016-11-14 DIAGNOSIS — R1030 Lower abdominal pain, unspecified: Secondary | ICD-10-CM

## 2016-11-14 DIAGNOSIS — A09 Infectious gastroenteritis and colitis, unspecified: Secondary | ICD-10-CM | POA: Diagnosis not present

## 2016-11-14 DIAGNOSIS — J309 Allergic rhinitis, unspecified: Secondary | ICD-10-CM

## 2016-11-14 MED ORDER — ACETAMINOPHEN-CODEINE #3 300-30 MG PO TABS: 1.0000 | ORAL_TABLET | Freq: Four times a day (QID) | ORAL | 0 refills | Status: DC | PRN

## 2016-11-14 MED ORDER — FLUTICASONE PROPIONATE 50 MCG/ACT NA SUSP: 2.0000 | Freq: Every day | NASAL | 0 refills | Status: DC

## 2016-11-14 MED ORDER — CETIRIZINE HCL 10 MG PO TABS: 10.0000 mg | ORAL_TABLET | Freq: Every day | ORAL | 0 refills | Status: DC

## 2016-11-14 MED ORDER — SALINE SPRAY 0.65 % NA SOLN: 1.0000 | NASAL | 0 refills | Status: DC | PRN

## 2016-11-14 NOTE — Patient Instructions (Addendum)
Normal GI pathogen. No additional medication needed at this time.  Diarrhea, Adult Diarrhea is when you have loose and water poop (stool) often. Diarrhea can make you feel weak and cause you to get dehydrated. Dehydration can make you tired and thirsty, make you have a dry mouth, and make it so you pee (urinate) less often. Diarrhea often lasts 2-3 days. However, it can last longer if it is a sign of something more serious. It is important to treat your diarrhea as told by your doctor. Follow these instructions at home: Eating and drinking  Follow these recommendations as told by your doctor:  Take an oral rehydration solution (ORS). This is a drink that is sold at pharmacies and stores.  Drink clear fluids, such as: ? Water. ? Ice chips. ? Diluted fruit juice. ? Low-calorie sports drinks.  Eat bland, easy-to-digest foods in small amounts as you are able. These foods include: ? Bananas. ? Applesauce. ? Rice. ? Low-fat (lean) meats. ? Toast. ? Crackers.  Avoid drinking fluids that have a lot of sugar or caffeine in them.  Avoid alcohol.  Avoid spicy or fatty foods.  General instructions   Drink enough fluid to keep your pee (urine) clear or pale yellow.  Wash your hands often. If you cannot use soap and water, use hand sanitizer.  Make sure that all people in your home wash their hands well and often.  Take over-the-counter and prescription medicines only as told by your doctor.  Rest at home while you get better.  Watch your condition for any changes.  Take a warm bath to help with any burning or pain from having diarrhea.  Keep all follow-up visits as told by your doctor. This is important. Contact a doctor if:  You have a fever.  Your diarrhea gets worse.  You have new symptoms.  You cannot keep fluids down.  You feel light-headed or dizzy.  You have a headache.  You have muscle cramps. Get help right away if:  You have chest pain.  You feel very  weak or you pass out (faint).  You have bloody or black poop or poop that look like tar.  You have very bad pain, cramping, or bloating in your belly (abdomen).  You have trouble breathing or you are breathing very quickly.  Your heart is beating very quickly.  Your skin feels cold and clammy.  You feel confused.  You have signs of dehydration, such as: ? Dark pee, hardly any pee, or no pee. ? Cracked lips. ? Dry mouth. ? Sunken eyes. ? Sleepiness. ? Weakness. This information is not intended to replace advice given to you by your health care provider. Make sure you discuss any questions you have with your health care provider. Document Released: 06/07/2007 Document Revised: 07/09/2015 Document Reviewed: 08/25/2014 Elsevier Interactive Patient Education  2018 [employer].

## 2016-11-16 ENCOUNTER — Encounter: Payer: Self-pay | Admitting: Nurse Practitioner

## 2016-11-16 LAB — GASTROINTESTINAL PATHOGEN PANEL PCR
C. difficile Tox A/B, PCR: NOT DETECTED
CAMPYLOBACTER, PCR: NOT DETECTED
CRYPTOSPORIDIUM, PCR: NOT DETECTED
E COLI (STEC) STX1/STX2, PCR: NOT DETECTED
E coli (ETEC) LT/ST PCR: NOT DETECTED
E coli 0157, PCR: NOT DETECTED
GIARDIA LAMBLIA, PCR: NOT DETECTED
Norovirus, PCR: NOT DETECTED
ROTAVIRUS, PCR: NOT DETECTED
Salmonella, PCR: NOT DETECTED
Shigella, PCR: NOT DETECTED

## 2016-11-16 NOTE — Progress Notes (Signed)
Subjective:  Patient ID: [name], female    DOB: 1955/04/22  Age: 61 y.o. MRN: [id number]  CC: Abdominal Pain (stomach cramping,diarrhea,painful at times--tylenol 3/ 10 days ago after trip [place]/ ) and Ear Pain (left ear pain, nose sore--infection?)  Abdominal Pain  This is a new problem. The current episode started 1 to 4 weeks ago. The onset quality is sudden. The problem occurs constantly. The problem has been unchanged. The pain is located in the generalized abdominal region. The quality of the pain is colicky, a sensation of fullness and dull. The abdominal pain does not radiate. Associated symptoms include anorexia and diarrhea. Pertinent negatives include no belching, constipation, dysuria, fever, flatus, frequency, hematochezia, hematuria, melena, myalgias, nausea or vomiting. The pain is aggravated by palpation and bowel movement. The pain is relieved by bowel movements. She has tried acetaminophen for the symptoms. Her past medical history is significant for GERD and irritable bowel syndrome.  onset while in [place].  She is also requesting for tylenoL #3 prescription. Medication used for chronic ABD pain with unknown etiology. States she lives in [place] and will like to have script, eventhough she can fill till 11/24/16. Controlled substance database reviewed, not red flags. UDS last done 10/27/2016.  Outpatient Medications Prior to Visit  Medication Sig Dispense Refill  . levothyroxine (SYNTHROID, LEVOTHROID) 50 MCG tablet TAKE ONE TABLET EVERY DAY BEFORE BEFORE 90 tablet 0  . metoprolol succinate (TOPROL-XL) 50 MG 24 hr tablet Take 1 tablet (50 mg total) by mouth daily. Take 1 by mouth daily 90 tablet 1  . triamcinolone (NASACORT AQ) 55 MCG/ACT AERO nasal inhaler Place 2 sprays into the nose daily. 1 Inhaler 12  . venlafaxine XR (EFFEXOR-XR) 150 MG 24 hr capsule Take 1 capsule (150 mg total) by mouth daily. Overdue for yearly physical w/labs must see MD for refills 30 capsule  0  . venlafaxine XR (EFFEXOR-XR) 150 MG 24 hr capsule TAKE ONE CAPSULE DAILY 90 capsule 3  . warfarin (COUMADIN) 7.5 MG tablet Take 1 tablet (7.5 mg total) by mouth daily. 90 tablet 1  . zolpidem (AMBIEN) 10 MG tablet TAKE 1 TABLET BY MOUTH EVERY DAY AT BEDTIME AS NEEDED 90 tablet 1  . acetaminophen-codeine (TYLENOL #3) 300-30 MG tablet Take 1 tablet by mouth 4 (four) times daily as needed. for pain 120 tablet 0  . cetirizine (ZYRTEC) 10 MG tablet Take 1 tablet (10 mg total) by mouth daily. 30 tablet 11   No facility-administered medications prior to visit.     ROS See HPI  Objective:  BP (!) 144/90   Pulse 78   Temp 98.2 F (36.8 C)   Ht 5\' 9"  (1.753 m)   SpO2 98%   BMI 31.60 kg/m   BP Readings from Last 3 Encounters:  11/14/16 (!) 144/90  08/29/16 (!) 142/88  05/16/16 (!) 164/104    Wt Readings from Last 3 Encounters:  05/16/16 214 lb (97.1 kg)  11/21/12 213 lb (96.6 kg)  11/12/12 210 lb 1.9 oz (95.3 kg)    Physical Exam  Constitutional: She is oriented to person, place, and time. No distress.  HENT:  Right Ear: Tympanic membrane, external ear and ear canal normal.  Left Ear: Tympanic membrane and ear canal normal.  Nose: Nose normal.  Mouth/Throat: Uvula is midline and oropharynx is clear and moist.  Cardiovascular: Normal rate.  Pulmonary/Chest: Effort normal.  Abdominal: Soft. Bowel sounds are normal. She exhibits no distension. There is tenderness. There is no rebound and  no guarding.  Musculoskeletal: She exhibits no edema.  Neurological: She is alert and oriented to person, place, and time.  Skin: Skin is warm and dry.  Vitals reviewed.   Lab Results  Component Value Date   WBC 6.2 10/02/2012   HGB 13.0 10/02/2012   HCT 38.2 10/02/2012   PLT 298.0 10/02/2012   GLUCOSE 108 (H) 10/02/2012   CHOL 209 (H) 12/13/2011   TRIG 195.0 (H) 12/13/2011   HDL 45.10 12/13/2011   LDLDIRECT 137.4 12/13/2011   LDLCALC 73 02/11/2008   ALT 28 12/13/2011   AST 28  12/13/2011   NA 139 10/02/2012   K 4.1 10/02/2012   CL 106 10/02/2012   CREATININE 0.9 10/02/2012   BUN 10 10/02/2012   CO2 28 10/02/2012   TSH 3.04 10/02/2012   INR 2.5 10/27/2016   HGBA1C  08/12/2007    5.2 (NOTE)   The ADA recommends the following therapeutic goal for glycemic   control related to Hgb A1C measurement:   Goal of Therapy:   < 7.0% Hgb A1C   Reference: American Diabetes Association: Clinical Practice   Recommendations 2008, Diabetes Care,  2008, 31:(Suppl 1).    Ct Head Wo Contrast  Result Date: 09/13/2016 CLINICAL DATA:  Headache. EXAM: CT HEAD WITHOUT CONTRAST CT MAXILLOFACIAL WITHOUT CONTRAST TECHNIQUE: Multidetector CT imaging of the head and maxillofacial structures were performed using the standard protocol without intravenous contrast. Multiplanar CT image reconstructions of the maxillofacial structures were also generated. COMPARISON:  CT scan of May 25, 2006. FINDINGS: CT HEAD FINDINGS Brain: No evidence of acute infarction, hemorrhage, hydrocephalus, extra-axial collection or mass lesion/mass effect. Vascular: No hyperdense vessel or unexpected calcification. Skull: Normal. Negative for fracture or focal lesion. Other: None. CT MAXILLOFACIAL FINDINGS Osseous: No fracture or mandibular dislocation. No destructive process. Orbits: Negative. No traumatic or inflammatory finding. Sinuses: Clear. Soft tissues: Negative. IMPRESSION: Normal head CT. No abnormality seen in maxillofacial region. Electronically Signed   By: Marijo Conception, M.D.   On: 09/13/2016 16:11   Ct Maxillofacial Wo Contrast  Result Date: 09/13/2016 CLINICAL DATA:  Headache. EXAM: CT HEAD WITHOUT CONTRAST CT MAXILLOFACIAL WITHOUT CONTRAST TECHNIQUE: Multidetector CT imaging of the head and maxillofacial structures were performed using the standard protocol without intravenous contrast. Multiplanar CT image reconstructions of the maxillofacial structures were also generated. COMPARISON:  CT scan of May 25, 2006. FINDINGS: CT HEAD FINDINGS Brain: No evidence of acute infarction, hemorrhage, hydrocephalus, extra-axial collection or mass lesion/mass effect. Vascular: No hyperdense vessel or unexpected calcification. Skull: Normal. Negative for fracture or focal lesion. Other: None. CT MAXILLOFACIAL FINDINGS Osseous: No fracture or mandibular dislocation. No destructive process. Orbits: Negative. No traumatic or inflammatory finding. Sinuses: Clear. Soft tissues: Negative. IMPRESSION: Normal head CT. No abnormality seen in maxillofacial region. Electronically Signed   By: Marijo Conception, M.D.   On: 09/13/2016 16:11    Assessment & Plan:   [name] was seen today for abdominal pain and ear pain.  Diagnoses and all orders for this visit:  Traveler's diarrhea -     Gastrointestinal Pathogen Panel PCR; Future  Lower abdominal pain -     Gastrointestinal Pathogen Panel PCR; Future  Allergic rhinitis, unspecified seasonality, unspecified trigger -     fluticasone (FLONASE) 50 MCG/ACT nasal spray; Place 2 sprays daily into both nostrils. -     sodium chloride (OCEAN) 0.65 % SOLN nasal spray; Place 1 spray as needed into both nostrils for congestion. -     cetirizine (ZYRTEC)  10 MG tablet; Take 1 tablet (10 mg total) daily by mouth.  Irritable bowel syndrome with diarrhea -     acetaminophen-codeine (TYLENOL #3) 300-30 MG tablet; Take 1 tablet 4 (four) times daily as needed by mouth. for pain. Do not fill before 11/24/2016   I have discontinued Jimya Ciani. Bart's cetirizine. I have also changed her acetaminophen-codeine. Additionally, I am having her start on fluticasone, sodium chloride, and cetirizine. Lastly, I am having her maintain her zolpidem, metoprolol succinate, triamcinolone, venlafaxine XR, venlafaxine XR, warfarin, and levothyroxine.  Meds ordered this encounter  Medications  . fluticasone (FLONASE) 50 MCG/ACT nasal spray    Sig: Place 2 sprays daily into both nostrils.    Dispense:  16 g      Refill:  0    Order Specific Question:   Supervising Provider    Answer:   Cassandria Anger [[id number]]  . sodium chloride (OCEAN) 0.65 % SOLN nasal spray    Sig: Place 1 spray as needed into both nostrils for congestion.    Dispense:  15 mL    Refill:  0    Order Specific Question:   Supervising Provider    Answer:   Cassandria Anger [[id number]]  . cetirizine (ZYRTEC) 10 MG tablet    Sig: Take 1 tablet (10 mg total) daily by mouth.    Dispense:  30 tablet    Refill:  0    Order Specific Question:   Supervising Provider    Answer:   Cassandria Anger [[id number]]  . acetaminophen-codeine (TYLENOL #3) 300-30 MG tablet    Sig: Take 1 tablet 4 (four) times daily as needed by mouth. for pain. Do not fill before 11/24/2016    Dispense:  120 tablet    Refill:  0    Do not fill before 11/24/2016    Order Specific Question:   Supervising Provider    Answer:   Cassandria Anger [[id number]]    Follow-up: No Follow-up on file.  Wilfred Lacy, NP

## 2016-11-21 ENCOUNTER — Ambulatory Visit: Payer: BLUE CROSS/BLUE SHIELD

## 2016-11-22 ENCOUNTER — Ambulatory Visit (INDEPENDENT_AMBULATORY_CARE_PROVIDER_SITE_OTHER): Payer: BLUE CROSS/BLUE SHIELD | Admitting: General Practice

## 2016-11-22 ENCOUNTER — Other Ambulatory Visit: Payer: Self-pay | Admitting: General Practice

## 2016-11-22 DIAGNOSIS — Z5181 Encounter for therapeutic drug level monitoring: Secondary | ICD-10-CM

## 2016-11-22 MED ORDER — WARFARIN SODIUM 7.5 MG PO TABS: 7.5000 mg | ORAL_TABLET | Freq: Every day | ORAL | 1 refills | Status: DC

## 2016-11-22 NOTE — Patient Instructions (Signed)
Pre visit review using our clinic review tool, if applicable. No additional management support is needed unless otherwise documented below in the visit note.   Continue to take  1 tablet daily except 2 tablets on Wednesdays.  Re-check in 4 weeks.  Go to the lab.

## 2016-12-08 ENCOUNTER — Other Ambulatory Visit: Payer: Self-pay | Admitting: Nurse Practitioner

## 2016-12-08 DIAGNOSIS — J309 Allergic rhinitis, unspecified: Secondary | ICD-10-CM

## 2016-12-11 ENCOUNTER — Ambulatory Visit: Payer: BLUE CROSS/BLUE SHIELD | Admitting: Neurology

## 2016-12-15 DIAGNOSIS — J019 Acute sinusitis, unspecified: Secondary | ICD-10-CM | POA: Diagnosis not present

## 2016-12-19 ENCOUNTER — Ambulatory Visit: Payer: BLUE CROSS/BLUE SHIELD

## 2016-12-22 ENCOUNTER — Other Ambulatory Visit: Payer: Self-pay | Admitting: Internal Medicine

## 2016-12-22 DIAGNOSIS — K580 Irritable bowel syndrome with diarrhea: Secondary | ICD-10-CM

## 2016-12-22 MED ORDER — ACETAMINOPHEN-CODEINE #3 300-30 MG PO TABS: 1.0000 | ORAL_TABLET | Freq: Four times a day (QID) | ORAL | 0 refills | Status: DC | PRN

## 2016-12-22 NOTE — Telephone Encounter (Signed)
Pharmacy correct # [phone], spouse calling and checking status.

## 2016-12-22 NOTE — Addendum Note (Signed)
Addended by: Biagio Borg on: 12/22/2016 12:51 PM   Modules accepted: Orders

## 2016-12-22 NOTE — Telephone Encounter (Signed)
Copied from [place] [id number]. Topic: Quick Communication - Rx Refill/Question >> Dec 22, 2016 10:26 AM Malena Catholic I, NT wrote: Has the patient contacted their pharmacy  yes    (Agent: If no, request that the patient contact the pharmacy for the refill. Tylenol 300-30 Mg    Preferred Pharmacy (with phone number or street name [facility] [place]  [phone]   Agent: Please be advised that RX refills may take up to 3 business days. We ask that you follow-up with your pharmacy.

## 2016-12-22 NOTE — Telephone Encounter (Signed)
Done erx 

## 2017-01-05 ENCOUNTER — Other Ambulatory Visit: Payer: Self-pay | Admitting: Nurse Practitioner

## 2017-01-05 DIAGNOSIS — J309 Allergic rhinitis, unspecified: Secondary | ICD-10-CM

## 2017-01-17 DIAGNOSIS — I361 Nonrheumatic tricuspid (valve) insufficiency: Secondary | ICD-10-CM | POA: Diagnosis not present

## 2017-01-17 DIAGNOSIS — Z954 Presence of other heart-valve replacement: Secondary | ICD-10-CM | POA: Diagnosis not present

## 2017-01-17 DIAGNOSIS — I10 Essential (primary) hypertension: Secondary | ICD-10-CM | POA: Diagnosis not present

## 2017-01-17 DIAGNOSIS — Z950 Presence of cardiac pacemaker: Secondary | ICD-10-CM | POA: Diagnosis not present

## 2017-01-17 DIAGNOSIS — R0609 Other forms of dyspnea: Secondary | ICD-10-CM | POA: Diagnosis not present

## 2017-01-17 DIAGNOSIS — Z7982 Long term (current) use of aspirin: Secondary | ICD-10-CM | POA: Diagnosis not present

## 2017-01-17 DIAGNOSIS — Z7901 Long term (current) use of anticoagulants: Secondary | ICD-10-CM | POA: Diagnosis not present

## 2017-01-17 DIAGNOSIS — I480 Paroxysmal atrial fibrillation: Secondary | ICD-10-CM | POA: Diagnosis not present

## 2017-01-17 DIAGNOSIS — Z952 Presence of prosthetic heart valve: Secondary | ICD-10-CM | POA: Diagnosis not present

## 2017-01-17 DIAGNOSIS — R0789 Other chest pain: Secondary | ICD-10-CM | POA: Diagnosis not present

## 2017-01-17 DIAGNOSIS — I422 Other hypertrophic cardiomyopathy: Secondary | ICD-10-CM | POA: Diagnosis not present

## 2017-01-17 DIAGNOSIS — I119 Hypertensive heart disease without heart failure: Secondary | ICD-10-CM | POA: Diagnosis not present

## 2017-01-17 DIAGNOSIS — R079 Chest pain, unspecified: Secondary | ICD-10-CM | POA: Diagnosis not present

## 2017-01-17 DIAGNOSIS — R0602 Shortness of breath: Secondary | ICD-10-CM | POA: Diagnosis not present

## 2017-01-17 DIAGNOSIS — I442 Atrioventricular block, complete: Secondary | ICD-10-CM | POA: Diagnosis not present

## 2017-01-17 DIAGNOSIS — Z79899 Other long term (current) drug therapy: Secondary | ICD-10-CM | POA: Diagnosis not present

## 2017-01-22 ENCOUNTER — Other Ambulatory Visit: Payer: Self-pay | Admitting: Internal Medicine

## 2017-01-22 DIAGNOSIS — K580 Irritable bowel syndrome with diarrhea: Secondary | ICD-10-CM

## 2017-01-22 NOTE — Telephone Encounter (Signed)
[place] controlled substance database queried, report printed/signed and sent to EMR scan  Done hardcopy to Shirron erx

## 2017-02-02 ENCOUNTER — Other Ambulatory Visit: Payer: Self-pay | Admitting: Internal Medicine

## 2017-02-21 ENCOUNTER — Encounter: Payer: Self-pay | Admitting: Internal Medicine

## 2017-02-21 ENCOUNTER — Telehealth: Payer: Self-pay | Admitting: Internal Medicine

## 2017-02-21 ENCOUNTER — Other Ambulatory Visit: Payer: Self-pay | Admitting: Internal Medicine

## 2017-02-21 DIAGNOSIS — K580 Irritable bowel syndrome with diarrhea: Secondary | ICD-10-CM

## 2017-02-21 NOTE — Telephone Encounter (Signed)
Copied from [place] [id number]. Topic: Quick Communication - See Telephone Encounter >> Feb 21, 2017  4:48 PM Arletha Grippe wrote: CRM for notification. See Telephone encounter for:   02/21/17. Husband is calling -  he says wife has come down with the flu and is asking for tamiflu. He says that wife has history of heart surgery and heart condition and is frail.  He and pt live 1.5 hrs away, and are asking if meds can be called in.  If so please send to [facility] [address]   cb is [phone]

## 2017-02-22 ENCOUNTER — Ambulatory Visit (INDEPENDENT_AMBULATORY_CARE_PROVIDER_SITE_OTHER): Payer: BLUE CROSS/BLUE SHIELD | Admitting: Internal Medicine

## 2017-02-22 ENCOUNTER — Encounter: Payer: Self-pay | Admitting: Internal Medicine

## 2017-02-22 VITALS — BP 108/68 | HR 94 | Temp 98.3°F | Ht 69.0 in

## 2017-02-22 DIAGNOSIS — R05 Cough: Secondary | ICD-10-CM

## 2017-02-22 DIAGNOSIS — R062 Wheezing: Secondary | ICD-10-CM

## 2017-02-22 DIAGNOSIS — I10 Essential (primary) hypertension: Secondary | ICD-10-CM

## 2017-02-22 DIAGNOSIS — R059 Cough, unspecified: Secondary | ICD-10-CM | POA: Insufficient documentation

## 2017-02-22 MED ORDER — ACETAMINOPHEN-CODEINE #3 300-30 MG PO TABS: 1.0000 | ORAL_TABLET | Freq: Four times a day (QID) | ORAL | 0 refills | Status: DC | PRN

## 2017-02-22 MED ORDER — HYDROCODONE-HOMATROPINE 5-1.5 MG/5ML PO SYRP: 5.0000 mL | ORAL_SOLUTION | Freq: Four times a day (QID) | ORAL | 0 refills | Status: DC | PRN

## 2017-02-22 MED ORDER — OSELTAMIVIR PHOSPHATE 75 MG PO CAPS: 75.0000 mg | ORAL_CAPSULE | Freq: Two times a day (BID) | ORAL | 0 refills | Status: DC

## 2017-02-22 MED ORDER — PREDNISONE 10 MG PO TABS: ORAL_TABLET | ORAL | 0 refills | Status: DC

## 2017-02-22 MED ORDER — SULFAMETHOXAZOLE-TRIMETHOPRIM 800-160 MG PO TABS: 1.0000 | ORAL_TABLET | Freq: Two times a day (BID) | ORAL | 0 refills | Status: DC

## 2017-02-22 NOTE — Telephone Encounter (Signed)
Chart  Reviewed  And  Dr  [name]  Communicated with  pts husband  By   E mail   And  Sent   Rx  In  To  Patient     And  Instructed  Patient  On plan of care .  Pt  Called  And  Left  Message   On VM  To call back if  Any  Questions or  Concerns

## 2017-02-22 NOTE — Assessment & Plan Note (Signed)
Suspect bronchospastic in nature, does not appear to have volume overload, for prednisone 20 qd x 5 days

## 2017-02-22 NOTE — Progress Notes (Signed)
Subjective:    Patient ID: [name], female    DOB: 04-27-1955, 62 y.o.   MRN: [id number]  HPI  Here with acute onset at least moderate illness x 1 day with temp 103, face flushed, weak, facial pain, rapidly worsening prod yellow and green cough assoc with lower midsternal CP and wheezing with sob but Pt denies other chest pain, orthopnea, PND, increased LE swelling, palpitations, dizziness or syncope.  Pt denies new neurological symptoms such as new headache, or facial or extremity weakness or numbness   Pt denies polydipsia, polyuria No other interval hx or complaints Past Medical History:  Diagnosis Date  . Abdominal pain, epigastric 10/25/2007  . ADD 01/20/2009  . ALLERGIC RHINITIS 10/15/2006  . ANGIOEDEMA 08/12/2008  . ANXIETY 10/15/2006  . ASTHMATIC BRONCHITIS, ACUTE 01/27/2008  . Cellulitis and abscess of leg, except foot 08/12/2007  . CELLULITIS, [place] 12/21/2008  . CHEST PAIN 06/14/2007  . Chronic anticoagulation 05/16/2016  . Chronic sinus infection 04/12/2010  . COLECTOMY, PARTIAL, WITH ANASTOMOSIS, HX OF 10/15/2006  . COLONIC POLYPS, HX OF 10/15/2006  . CYST, OVARIAN NEC/NOS 10/15/2006  . Dengue 02/22/2010  . DVT, HX OF 10/15/2006  . DYSAUTONOMIA 10/15/2006  . Dysfunction of eustachian tube 05/26/2009  . GI BLEEDING 04/04/2007  . Glossitis 03/06/2008  . HEADACHE, CHRONIC 04/04/2007  . Hematemesis 10/25/2007  . HEMORRHOIDS 04/04/2007  . HYPERLIPIDEMIA 12/12/2007  . HYPERTENSION 10/15/2006  . HYPOTHYROIDISM 03/06/2008  . IBS 04/04/2007  . MASTECTOMY, BILATERAL, HX OF 08/12/2009  . NECK MASS 07/01/2007  . NEOP, MALIGNANT, FEMALE BREAST NOS 10/15/2006  . 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 SYNCOPE 07/01/2007  . Wheezing 08/20/2008  . WOUND, OPEN, LEG, WITHOUT COMPLICATION 3/97/6734   Past Surgical History:  Procedure Laterality Date  . ABDOMINAL HYSTERECTOMY    . bilateral mastectomy/reconstruction    . CESAREAN SECTION    . CHOLECYSTECTOMY    . COLONOSCOPY W/ BIOPSIES    .  FLEXIBLE SIGMOIDOSCOPY    . LEFT AND RIGHT HEART CATHETERIZATION WITH CORONARY ANGIOGRAM N/A 10/07/2012   Procedure: LEFT AND RIGHT HEART CATHETERIZATION WITH CORONARY ANGIOGRAM;  Surgeon: Jolaine Artist, MD;  Location[facility] C[facility] LAB;  Service: Cardiovascular;  Laterality: N/A;  . multiple ablations    . OOPHORECTOMY    . spigellian hernia      reports that  has never smoked. she has never used smokeless tobacco. She reports that she does not drink alcohol or use drugs. family history includes Cancer in her sister. Allergies  Allergen Reactions  . Fish Allergy Swelling  . Latex Dermatitis    More than a day causes blisters   . Levaquin [Levofloxacin In D5w]     n/v  . Penicillins     REACTION: shock   Current Outpatient Medications on File Prior to Visit  Medication Sig Dispense Refill  . acetaminophen-codeine (TYLENOL #3) 300-30 MG tablet Take 1 tablet by mouth every 6 (six) hours as needed. 120 tablet 0  . cetirizine (ZYRTEC) 10 MG tablet Take 1 tablet (10 mg total) daily by mouth. 30 tablet 0  . fluticasone (FLONASE) 50 MCG/ACT nasal spray SHAKE LIQUID AND USE 2 SPRAYS IN EACH NOSTRIL DAILY 16 g 0  . levothyroxine (SYNTHROID, LEVOTHROID) 50 MCG tablet TAKE ONE TABLET EVERY DAY BEFORE BEFORE 90 tablet 0  . metoprolol succinate (TOPROL-XL) 50 MG 24 hr tablet Take 1 tablet (50 mg total) by mouth daily. Take 1 by mouth daily 90 tablet 1  . oseltamivir (TAMIFLU) 75 MG  capsule Take 1 capsule (75 mg total) by mouth 2 (two) times daily. 10 capsule 0  . sodium chloride (OCEAN) 0.65 % SOLN nasal spray Place 1 spray as needed into both nostrils for congestion. 15 mL 0  . triamcinolone (NASACORT AQ) 55 MCG/ACT AERO nasal inhaler Place 2 sprays into the nose daily. 1 Inhaler 12  . venlafaxine XR (EFFEXOR-XR) 150 MG 24 hr capsule TAKE ONE CAPSULE DAILY 90 capsule 3  . warfarin (COUMADIN) 7.5 MG tablet Take 1 tablet (7.5 mg total) by mouth daily. Or as directed by anticoagulation clinic 120  tablet 1  . zolpidem (AMBIEN) 10 MG tablet TAKE 1 TABLET BY MOUTH EVERY DAY AT BEDTIME AS NEEDED 90 tablet 1   No current facility-administered medications on file prior to visit.    Review of Systems  Constitutional: Negative for other unusual diaphoresis or sweats HENT: Negative for ear discharge or swelling Eyes: Negative for other worsening visual disturbances Respiratory: Negative for stridor or other swelling  Gastrointestinal: Negative for worsening distension or other blood Genitourinary: Negative for retention or other urinary change Musculoskeletal: Negative for other MSK pain or swelling Skin: Negative for color change or other new lesions Neurological: Negative for worsening tremors and other numbness  Psychiatric/Behavioral: Negative for worsening agitation or other fatigue All other system neg per pt    Objective:   Physical Exam BP 108/68   Pulse 94   Temp 98.3 F (36.8 C) (Oral)   Ht 5\' 9"  (1.753 m)   SpO2 94%   BMI 31.60 kg/m  VS noted, mild to mod ill appearing, facies flushed and hot to touch, generally weak Constitutional: Pt appears in NAD HENT: Head: NCAT.  Right Ear: External ear normal.  Left Ear: External ear normal.  Eyes: . Pupils are equal, round, and reactive to light. Conjunctivae and EOM are normal Nose: without d/c or deformity Neck: Neck supple. Gross normal ROM Cardiovascular: Normal rate and regular rhythm.   Pulmonary/Chest: Effort normal and breath sounds decreased without rales but with course BS and few bilat scattered wheezing.  Neurological: Pt is alert. At baseline orientation, motor grossly intact Skin: Skin is warm. No rashes, other new lesions, no LE edema Psychiatric: Pt behavior is normal without agitation  No other exam findings    Assessment & Plan:

## 2017-02-22 NOTE — Patient Instructions (Addendum)
Please take all new medication as prescribed - the antibiotic, cough medicine and prednisone  Please continue all other medications as before, and refills have been done if requested.  Please have the pharmacy call with any other refills you may need.  Please keep your appointments with your specialists as you may have planned     

## 2017-02-22 NOTE — Assessment & Plan Note (Signed)
Mod to severe, c/w acute infectious illness possible influenza vs other, declines cxr, for antibx course and tamiflu, cough med prn,  to f/u any worsening symptoms or concerns

## 2017-02-22 NOTE — Assessment & Plan Note (Signed)
Low normal today, o/w stable overall by history and exam, recent data reviewed with pt, and pt to continue medical treatment as before,  to f/u any worsening symptoms or concerns BP Readings from Last 3 Encounters:  02/22/17 108/68  11/14/16 (!) 144/90  08/29/16 (!) 142/88

## 2017-02-28 ENCOUNTER — Other Ambulatory Visit: Payer: Self-pay | Admitting: Internal Medicine

## 2017-03-01 MED ORDER — HYDROCODONE-HOMATROPINE 5-1.5 MG/5ML PO SYRP: 5.0000 mL | ORAL_SOLUTION | Freq: Four times a day (QID) | ORAL | 0 refills | Status: AC | PRN

## 2017-03-01 MED ORDER — SULFAMETHOXAZOLE-TRIMETHOPRIM 800-160 MG PO TABS: 1.0000 | ORAL_TABLET | Freq: Two times a day (BID) | ORAL | 0 refills | Status: DC

## 2017-03-01 NOTE — Telephone Encounter (Signed)
Done this time but would need OV for further refills if needed

## 2017-03-22 ENCOUNTER — Other Ambulatory Visit: Payer: Self-pay | Admitting: Internal Medicine

## 2017-03-22 DIAGNOSIS — K580 Irritable bowel syndrome with diarrhea: Secondary | ICD-10-CM

## 2017-03-22 NOTE — Telephone Encounter (Signed)
Done erx 

## 2017-03-23 ENCOUNTER — Other Ambulatory Visit: Payer: Self-pay | Admitting: Internal Medicine

## 2017-03-23 DIAGNOSIS — K580 Irritable bowel syndrome with diarrhea: Secondary | ICD-10-CM

## 2017-04-11 ENCOUNTER — Other Ambulatory Visit: Payer: Self-pay | Admitting: Internal Medicine

## 2017-04-20 ENCOUNTER — Other Ambulatory Visit: Payer: Self-pay | Admitting: Internal Medicine

## 2017-04-20 DIAGNOSIS — K580 Irritable bowel syndrome with diarrhea: Secondary | ICD-10-CM

## 2017-04-23 NOTE — Telephone Encounter (Signed)
Done erx 

## 2017-05-04 ENCOUNTER — Other Ambulatory Visit: Payer: Self-pay | Admitting: Internal Medicine

## 2017-05-21 ENCOUNTER — Encounter: Payer: Self-pay | Admitting: Internal Medicine

## 2017-05-21 ENCOUNTER — Other Ambulatory Visit: Payer: Self-pay | Admitting: Internal Medicine

## 2017-05-21 DIAGNOSIS — K580 Irritable bowel syndrome with diarrhea: Secondary | ICD-10-CM

## 2017-05-21 NOTE — Telephone Encounter (Signed)
Done erx 

## 2017-05-29 ENCOUNTER — Ambulatory Visit (INDEPENDENT_AMBULATORY_CARE_PROVIDER_SITE_OTHER): Payer: BLUE CROSS/BLUE SHIELD | Admitting: Internal Medicine

## 2017-05-29 ENCOUNTER — Ambulatory Visit (INDEPENDENT_AMBULATORY_CARE_PROVIDER_SITE_OTHER): Payer: BLUE CROSS/BLUE SHIELD | Admitting: General Practice

## 2017-05-29 ENCOUNTER — Encounter: Payer: Self-pay | Admitting: Internal Medicine

## 2017-05-29 VITALS — BP 134/86 | HR 80 | Temp 97.7°F | Ht 69.0 in

## 2017-05-29 DIAGNOSIS — J309 Allergic rhinitis, unspecified: Secondary | ICD-10-CM | POA: Diagnosis not present

## 2017-05-29 DIAGNOSIS — Z5181 Encounter for therapeutic drug level monitoring: Secondary | ICD-10-CM

## 2017-05-29 DIAGNOSIS — H5319 Other subjective visual disturbances: Secondary | ICD-10-CM

## 2017-05-29 DIAGNOSIS — H519 Unspecified disorder of binocular movement: Secondary | ICD-10-CM | POA: Diagnosis not present

## 2017-05-29 DIAGNOSIS — H6593 Unspecified nonsuppurative otitis media, bilateral: Secondary | ICD-10-CM | POA: Diagnosis not present

## 2017-05-29 DIAGNOSIS — R42 Dizziness and giddiness: Secondary | ICD-10-CM | POA: Diagnosis not present

## 2017-05-29 LAB — POCT INR: INR: 3.8 — AB (ref 2.0–3.0)

## 2017-05-29 MED ORDER — CEFUROXIME AXETIL 250 MG PO TABS: 250.0000 mg | ORAL_TABLET | Freq: Two times a day (BID) | ORAL | 1 refills | Status: AC

## 2017-05-29 MED ORDER — MECLIZINE HCL 12.5 MG PO TABS: 12.5000 mg | ORAL_TABLET | Freq: Three times a day (TID) | ORAL | 1 refills | Status: DC | PRN

## 2017-05-29 NOTE — Patient Instructions (Signed)
Please take all new medication as prescribed - the antibiotic, nasacort, and antivert as needed  You will be contacted regarding the referral for: Neurology  Please continue all other medications as before, and refills have been done if requested.  Please have the pharmacy call with any other refills you may need.  Please keep your appointments with your specialists as you may have planned

## 2017-05-29 NOTE — Patient Instructions (Signed)
Pre visit review using our clinic review tool, if applicable. No additional management support is needed unless otherwise documented below in the visit note. 

## 2017-05-29 NOTE — Progress Notes (Signed)
Subjective:    Patient ID: [name], female    DOB: [birth year], 62 y.o.   MRN: [id number]  HPI   Here with 2-3 days acute onset fever, right ear pain, pressure, headache, general weakness and malaise, and greenish d/c, with mild ST and cough, but pt denies chest pain, wheezing, increased sob or doe, orthopnea, PND, increased LE swelling, palpitations, dizziness or syncope.  Does have several wks ongoing nasal allergy symptoms with clearish congestion, itch and sneezing, without fever, pain, ST, cough, swelling or wheezing.  Also c/o unusual ocular symptoms feeling of left eye up, right eye down < 1 min, and numerous other somatic symptoms including some dizzy with nausea, left shoulder blade pain, left arm coldish and numb feeling , lips numb, left toes and arm fingers swelling, fatigue, bending with dizzy, sob and doe and severe exhaustion, low stamina, heat intolerance.  2 days ago was at the [place] but mostly stayed inside with [facility].  Feels like a pressure choking feeling to the suprasternal notch area.   Never saw neurology due to cancel with snow last winter.  Leaving for [place] about July 2, gone for 3 wks  No other new complaints Past Medical History:  Diagnosis Date  . Abdominal pain, epigastric 10/25/2007  . ADD 01/20/2009  . ALLERGIC RHINITIS 10/15/2006  . ANGIOEDEMA 08/12/2008  . ANXIETY 10/15/2006  . ASTHMATIC BRONCHITIS, ACUTE 01/27/2008  . Cellulitis and abscess of leg, except foot 08/12/2007  . CELLULITIS, [place] 12/21/2008  . CHEST PAIN 06/14/2007  . Chronic anticoagulation 05/16/2016  . Chronic sinus infection 04/12/2010  . COLECTOMY, PARTIAL, WITH ANASTOMOSIS, HX OF 10/15/2006  . COLONIC POLYPS, HX OF 10/15/2006  . CYST, OVARIAN NEC/NOS 10/15/2006  . Dengue 02/22/2010  . DVT, HX OF 10/15/2006  . DYSAUTONOMIA 10/15/2006  . Dysfunction of eustachian tube 05/26/2009  . GI BLEEDING 04/04/2007  . Glossitis 03/06/2008  . HEADACHE, CHRONIC 04/04/2007  . Hematemesis 10/25/2007  . HEMORRHOIDS  04/04/2007  . HYPERLIPIDEMIA 12/12/2007  . HYPERTENSION 10/15/2006  . HYPOTHYROIDISM 03/06/2008  . IBS 04/04/2007  . MASTECTOMY, BILATERAL, HX OF 08/12/2009  . NECK MASS 07/01/2007  . NEOP, MALIGNANT, FEMALE BREAST NOS 10/15/2006  . 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 SYNCOPE 07/01/2007  . Wheezing 08/20/2008  . WOUND, OPEN, LEG, WITHOUT COMPLICATION 5/63/8937   Past Surgical History:  Procedure Laterality Date  . ABDOMINAL HYSTERECTOMY    . bilateral mastectomy/reconstruction    . CESAREAN SECTION    . CHOLECYSTECTOMY    . COLONOSCOPY W/ BIOPSIES    . FLEXIBLE SIGMOIDOSCOPY    . LEFT AND RIGHT HEART CATHETERIZATION WITH CORONARY ANGIOGRAM N/A 10/07/2012   Procedure: LEFT AND RIGHT HEART CATHETERIZATION WITH CORONARY ANGIOGRAM;  Surgeon: Jolaine Artist, MD;  Location: [facility] CATH LAB;  Service: Cardiovascular;  Laterality: N/A;  . multiple ablations    . OOPHORECTOMY    . spigellian hernia      reports that she has never smoked. She has never used smokeless tobacco. She reports that she does not drink alcohol or use drugs. family history includes Cancer in her sister; Heart disease in her mother; Pancreatic cancer in her father. Allergies  Allergen Reactions  . Fish Allergy Swelling  . Latex Dermatitis    More than a day causes blisters   . Levaquin [Levofloxacin In D5w]     n/v  . Penicillins     REACTION: shock   Current Outpatient Medications on File Prior to Visit  Medication Sig Dispense Refill  .  acetaminophen-codeine (TYLENOL #3) 300-30 MG tablet TAKE 1 TABLET BY MOUTH EVERY 6 HOURS AS NEEDED 120 tablet 0  . fluticasone (FLONASE) 50 MCG/ACT nasal spray SHAKE LIQUID AND USE 2 SPRAYS IN EACH NOSTRIL DAILY (Patient not taking: Reported on 05/31/2017) 16 g 0  . levothyroxine (SYNTHROID, LEVOTHROID) 50 MCG tablet TAKE ONE TABLET EVERY DAY BEFORE BEFORE 90 tablet 0  . metoprolol succinate (TOPROL-XL) 50 MG 24 hr tablet Take 1 tablet (50 mg total) by mouth daily. Take 1 by mouth  daily 90 tablet 1  . venlafaxine XR (EFFEXOR-XR) 150 MG 24 hr capsule TAKE ONE CAPSULE DAILY 90 capsule 0  . warfarin (COUMADIN) 7.5 MG tablet Take 1 tablet (7.5 mg total) by mouth daily. Or as directed by anticoagulation clinic 120 tablet 1  . zolpidem (AMBIEN) 10 MG tablet TAKE 1 TABLET BY MOUTH EVERY DAY AT BEDTIME AS NEEDED 90 tablet 1   No current facility-administered medications on file prior to visit.    Review of Systems  Constitutional: Negative for other unusual diaphoresis or sweats HENT: Negative for ear discharge or swelling Eyes: Negative for other worsening visual disturbances Respiratory: Negative for stridor or other swelling  Gastrointestinal: Negative for worsening distension or other blood Genitourinary: Negative for retention or other urinary change Musculoskeletal: Negative for other MSK pain or swelling Skin: Negative for color change or other new lesions Neurological: Negative for worsening tremors and other numbness  Psychiatric/Behavioral: Negative for worsening agitation or other fatigue All other system neg per pt    Objective:   Physical Exam BP 134/86   Pulse 80   Temp 97.7 F (36.5 C) (Oral)   Ht 5\' 9"  (1.753 m)   SpO2 96%   BMI 31.60 kg/m  VS noted,  Constitutional: Pt appears in NAD HENT: Head: NCAT.  Right Ear: External ear normal.  Left Ear: External ear normal.  Eyes: . Pupils are equal, round, and reactive to light. Conjunctivae and EOM are normal Right tm's with mod to severe erythema with effusion, left TM slight erythema only.  Max sinus areas mild tender.  Pharynx with mild erythema, no exudate Nose: without d/c or deformity Neck: Neck supple. Gross normal ROM Cardiovascular: Normal rate and regular rhythm.   Pulmonary/Chest: Effort normal and breath sounds without rales or wheezing.  Neurological: Pt is alert. At baseline orientation, motor grossly intact Skin: Skin is warm. No rashes, other new lesions, no LE edema Psychiatric: Pt  behavior is normal without agitation  No other exam findings    Assessment & Plan:

## 2017-05-31 ENCOUNTER — Encounter: Payer: Self-pay | Admitting: Neurology

## 2017-05-31 ENCOUNTER — Other Ambulatory Visit: Payer: Self-pay

## 2017-05-31 ENCOUNTER — Ambulatory Visit (INDEPENDENT_AMBULATORY_CARE_PROVIDER_SITE_OTHER): Payer: BLUE CROSS/BLUE SHIELD | Admitting: Neurology

## 2017-05-31 VITALS — BP 135/79 | HR 81 | Resp 20 | Ht 69.0 in | Wt 215.0 lb

## 2017-05-31 DIAGNOSIS — R519 Headache, unspecified: Secondary | ICD-10-CM

## 2017-05-31 DIAGNOSIS — R42 Dizziness and giddiness: Secondary | ICD-10-CM

## 2017-05-31 DIAGNOSIS — R51 Headache: Secondary | ICD-10-CM

## 2017-05-31 DIAGNOSIS — H519 Unspecified disorder of binocular movement: Secondary | ICD-10-CM | POA: Diagnosis not present

## 2017-05-31 DIAGNOSIS — G43109 Migraine with aura, not intractable, without status migrainosus: Secondary | ICD-10-CM | POA: Diagnosis not present

## 2017-05-31 MED ORDER — ACETAZOLAMIDE 250 MG PO TABS: ORAL_TABLET | ORAL | 5 refills | Status: DC

## 2017-05-31 NOTE — Progress Notes (Signed)
[facility]  PATIENT: [name] DOB: April 12, 1955  REFERRING DOCTOR OR PCP:  Cathlean Cower SOURCE: Patient, notes from Dr. [name], CT and lab results, CT images on PACS personally reviewed.  _________________________________   HISTORICAL  CHIEF COMPLAINT:  Chief Complaint  Patient presents with  . Abnormal Eye Movement    HISTORY OF PRESENT ILLNESS:  I had the pleasure seeing patient, [name], and [facility] for neurologic consultation regarding her episodes of abnormal eye movements with vertigo.  She is a 62 yo woman who has had several episodes of abnormal eye movements since around August 2018.   The episode lasts 1-3 minutes and she feels she cannot walk for 15 minutes or so after each minute due to severe vertigo. She also notes lip numbness and left arm (rarely arm and leg) numbness for a few minutes while she is dizzy.   She sometimes gets a headache.   She is experiencing them 1-2 times a week.    She does not take nausea or anti-psychotic medications.    She has been prescribed meclizine but has never taken it.    She gets a sensation like someone is pushing a finger into her upper chest above the sternum when these episodes occur.     She has near daily episodes of flashing in her vision since open heart surgery April 19, 2016.   These spells started while in the hospital.   They have been called ocular migraines.   She does not get a headache but notes some eye pain for a bout 1 hour when this occurs.      I personally reviewed the CT scan of the head and sinuses dated 09/13/2016.  The brain is normal for age.  There is no significant atrophy and no evidence of stroke or other changes.  The sinuses were normal.  The internal auditory canals appear normal.  She has a strong FH of migraines but mother and sister do not get aura.        She has a pacemaker.     She had dysautonomia in the past.     REVIEW OF  SYSTEMS: Constitutional: No fevers, chills, sweats, or change in appetite Eyes: No visual changes, double vision, eye pain Ear, nose and throat: No hearing loss, ear pain, nasal congestion, sore throat Cardiovascular: No chest pain, palpitations Respiratory: No shortness of breath at rest or with exertion.   No wheezes GastrointestinaI: No nausea, vomiting, diarrhea, abdominal pain, fecal incontinence Genitourinary: No dysuria, urinary retention or frequency.  No nocturia. Musculoskeletal: No neck pain, back pain Integumentary: No rash, pruritus, skin lesions Neurological: as above Psychiatric: No depression at this time.  No anxiety Endocrine: No palpitations, diaphoresis, change in appetite, change in weigh or increased thirst Hematologic/Lymphatic: No anemia, purpura, petechiae. Allergic/Immunologic: No itchy/runny eyes, nasal congestion, recent allergic reactions, rashes  ALLERGIES: Allergies  Allergen Reactions  . Fish Allergy Swelling  . Latex Dermatitis    More than a day causes blisters   . Levaquin [Levofloxacin In D5w]     n/v  . Penicillins     REACTION: shock    HOME MEDICATIONS:  Current Outpatient Medications:  .  acetaminophen-codeine (TYLENOL #3) 300-30 MG tablet, TAKE 1 TABLET BY MOUTH EVERY 6 HOURS AS NEEDED, Disp: 120 tablet, Rfl: 0 .  cefUROXime (CEFTIN) 250 MG tablet, Take 1 tablet (250 mg total) by mouth 2 (two) times daily for 10 days., Disp: 20 tablet, Rfl: 1 .  levothyroxine (SYNTHROID, LEVOTHROID) 50 MCG tablet, TAKE ONE TABLET EVERY DAY BEFORE BEFORE, Disp: 90 tablet, Rfl: 0 .  metoprolol succinate (TOPROL-XL) 50 MG 24 hr tablet, Take 1 tablet (50 mg total) by mouth daily. Take 1 by mouth daily, Disp: 90 tablet, Rfl: 1 .  venlafaxine XR (EFFEXOR-XR) 150 MG 24 hr capsule, TAKE ONE CAPSULE DAILY, Disp: 90 capsule, Rfl: 0 .  warfarin (COUMADIN) 7.5 MG tablet, Take 1 tablet (7.5 mg total) by mouth daily. Or as directed by anticoagulation clinic, Disp:  120 tablet, Rfl: 1 .  zolpidem (AMBIEN) 10 MG tablet, TAKE 1 TABLET BY MOUTH EVERY DAY AT BEDTIME AS NEEDED, Disp: 90 tablet, Rfl: 1 .  acetaZOLAMIDE (DIAMOX) 250 MG tablet, One po bid, Disp: 60 tablet, Rfl: 5 .  fluticasone (FLONASE) 50 MCG/ACT nasal spray, SHAKE LIQUID AND USE 2 SPRAYS IN EACH NOSTRIL DAILY (Patient not taking: Reported on 05/31/2017), Disp: 16 g, Rfl: 0 .  meclizine (ANTIVERT) 12.5 MG tablet, Take 1 tablet (12.5 mg total) by mouth 3 (three) times daily as needed for dizziness. (Patient not taking: Reported on 05/31/2017), Disp: 30 tablet, Rfl: 1  PAST MEDICAL HISTORY: Past Medical History:  Diagnosis Date  . Abdominal pain, epigastric 10/25/2007  . ADD 01/20/2009  . ALLERGIC RHINITIS 10/15/2006  . ANGIOEDEMA 08/12/2008  . ANXIETY 10/15/2006  . ASTHMATIC BRONCHITIS, ACUTE 01/27/2008  . Cellulitis and abscess of leg, except foot 08/12/2007  . CELLULITIS, [place] 12/21/2008  . CHEST PAIN 06/14/2007  . Chronic anticoagulation 05/16/2016  . Chronic sinus infection 04/12/2010  . COLECTOMY, PARTIAL, WITH ANASTOMOSIS, HX OF 10/15/2006  . COLONIC POLYPS, HX OF 10/15/2006  . CYST, OVARIAN NEC/NOS 10/15/2006  . Dengue 02/22/2010  . DVT, HX OF 10/15/2006  . DYSAUTONOMIA 10/15/2006  . Dysfunction of eustachian tube 05/26/2009  . GI BLEEDING 04/04/2007  . Glossitis 03/06/2008  . HEADACHE, CHRONIC 04/04/2007  . Hematemesis 10/25/2007  . HEMORRHOIDS 04/04/2007  . HYPERLIPIDEMIA 12/12/2007  . HYPERTENSION 10/15/2006  . HYPOTHYROIDISM 03/06/2008  . IBS 04/04/2007  . MASTECTOMY, BILATERAL, HX OF 08/12/2009  . NECK MASS 07/01/2007  . NEOP, MALIGNANT, FEMALE BREAST NOS 10/15/2006  . 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 SYNCOPE 07/01/2007  . Wheezing 08/20/2008  . WOUND, OPEN, LEG, WITHOUT COMPLICATION 9/52/8413    PAST SURGICAL HISTORY: Past Surgical History:  Procedure Laterality Date  . ABDOMINAL HYSTERECTOMY    . bilateral mastectomy/reconstruction    . CESAREAN SECTION    . CHOLECYSTECTOMY     . COLONOSCOPY W/ BIOPSIES    . FLEXIBLE SIGMOIDOSCOPY    . LEFT AND RIGHT HEART CATHETERIZATION WITH CORONARY ANGIOGRAM N/A 10/07/2012   Procedure: LEFT AND RIGHT HEART CATHETERIZATION WITH CORONARY ANGIOGRAM;  Surgeon: Jolaine Artist, MD;  Location: [facility] CATH LAB;  Service: Cardiovascular;  Laterality: N/A;  . multiple ablations    . OOPHORECTOMY    . spigellian hernia      FAMILY HISTORY: Family History  Problem Relation Age of Onset  . Heart disease Mother   . Cancer Sister        ovarian cancer  . Pancreatic cancer Father     SOCIAL HISTORY:  Social History   Socioeconomic History  . Marital status: Married    Spouse name: Not on file  . Number of children: Not on file  . Years of education: Not on file  . Highest education level: Not on file  Occupational History  . Not on file  Social Needs  . Financial resource strain: Not on  file  . Food insecurity:    Worry: Not on file    Inability: Not on file  . Transportation needs:    Medical: Not on file    Non-medical: Not on file  Tobacco Use  . Smoking status: Never Smoker  . Smokeless tobacco: Never Used  Substance and Sexual Activity  . Alcohol use: No  . Drug use: No  . Sexual activity: Not on file  Lifestyle  . Physical activity:    Days per week: Not on file    Minutes per session: Not on file  . Stress: Not on file  Relationships  . Social connections:    Talks on phone: Not on file    Gets together: Not on file    Attends religious service: Not on file    Active member of club or organization: Not on file    Attends meetings of clubs or organizations: Not on file    Relationship status: Not on file  . Intimate partner violence:    Fear of current or ex partner: Not on file    Emotionally abused: Not on file    Physically abused: Not on file    Forced sexual activity: Not on file  Other Topics Concern  . Not on file  Social History Narrative   [place] a few times per year.   Non  smoking           PHYSICAL EXAM  Vitals:   05/31/17 1450  BP: 135/79  Pulse: 81  Resp: 20  Weight: 215 lb (97.5 kg)  Height: 5\' 9"  (1.753 m)    Body mass index is 31.75 kg/m.   General: The patient is well-developed and well-nourished and in no acute distress.   She has a sternotomy scar  HEENT:  [place]/AT, Funduscopic exam shows normal optic discs and retinal vessels.   Tympanic membranes are intact.  Ear canals are intact.  Throat is not erythematous.  Neck: The neck is supple, no carotid bruits are noted.  The neck is nontender.  Cardiovascular: The heart has a regular rate and rhythm. Lungs are clear to auscultation.  Skin: Extremities are without significant edema.  Musculoskeletal:  Back is nontender  Neurologic Exam  Mental status: The patient is alert and oriented x 3 at the time of the examination. The patient has apparent normal recent and remote memory, with an apparently normal attention span and concentration ability.   Speech is normal.  Cranial nerves: Extraocular movements are full. Pupils are equal, round, and reactive to light and accomodation.  Visual fields are full.  Facial symmetry is present. There is good facial sensation to soft touch bilaterally.Facial strength is normal.  Trapezius and sternocleidomastoid strength is normal. No dysarthria is noted.  The tongue is midline, and the patient has symmetric elevation of the soft palate. No obvious hearing deficits are noted.  Motor:  Muscle bulk is normal.   Tone is normal. Strength is  5 / 5 in all 4 extremities.   Sensory: Sensory testing is intact to pinprick, soft touch and vibration sensation in all 4 extremities.  Coordination: Cerebellar testing reveals good finger-nose-finger and heel-to-shin bilaterally.  Gait and station: Station is normal.   Gait is normal. Tandem gait is normal. Romberg is negative.   Reflexes: Deep tendon reflexes are symmetric and normal bilaterally.   Plantar responses  are flexor.    DIAGNOSTIC DATA (LABS, IMAGING, TESTING) - I reviewed patient records, labs, notes, testing and imaging myself where  available.  Lab Results  Component Value Date   WBC 6.2 10/02/2012   HGB 13.0 10/02/2012   HCT 38.2 10/02/2012   MCV 87.6 10/02/2012   PLT 298.0 10/02/2012      Component Value Date/Time   NA 139 10/02/2012 1444   K 4.1 10/02/2012 1444   CL 106 10/02/2012 1444   CO2 28 10/02/2012 1444   GLUCOSE 108 (H) 10/02/2012 1444   BUN 10 10/02/2012 1444   CREATININE 0.9 10/02/2012 1444   CALCIUM 9.0 10/02/2012 1444   PROT 7.4 12/13/2011 1244   ALBUMIN 4.2 12/13/2011 1244   AST 28 12/13/2011 1244   ALT 28 12/13/2011 1244   ALKPHOS 132 (H) 12/13/2011 1244   BILITOT 0.8 12/13/2011 1244   GFRNONAA 70 02/11/2008 1444   GFRAA 84 02/11/2008 1444   Lab Results  Component Value Date   CHOL 209 (H) 12/13/2011   HDL 45.10 12/13/2011   LDLCALC 73 02/11/2008   LDLDIRECT 137.4 12/13/2011   TRIG 195.0 (H) 12/13/2011   CHOLHDL 5 12/13/2011   Lab Results  Component Value Date   HGBA1C  08/12/2007    5.2 (NOTE)   The ADA recommends the following therapeutic goal for glycemic   control related to Hgb A1C measurement:   Goal of Therapy:   < 7.0% Hgb A1C   Reference: American Diabetes Association: Clinical Practice   Recommendations 2008, Diabetes Care,  2008, 31:(Suppl 1).   Lab Results  Component Value Date   VITAMINB12 820 02/11/2008   Lab Results  Component Value Date   TSH 3.04 10/02/2012       ASSESSMENT AND PLAN  Migraine equivalent syndrome  Nonintractable headache, unspecified chronicity pattern, unspecified headache type  Ocular motility disturbance  Dizzy  In summary, [name] is a 62 year old woman with intermittent spells of dystonic movements of her eyes followed by vertigo, headache and numbness lasting about 15 minutes.  Additionally, she gets spells of visual scintillations sometimes in [address] more than the other.  The  headache is really more in the eyes and not typical for migraine.  She does not get nausea, vomiting, photophobia or phonophobia.  She is not on any neuroleptic or other medication commonly associated with oculogyric crises.  Plus the frequency of the events would make this unlikely.  Therefore, these episodes likely represent migraine equivalents.  She is on metoprolol but is clearly not getting a benefit.   I will have her try Diamox.  If not successful, consider 1 of the epilepsy medications and if that is not successful, consider an anti-CGRP compound.  She will return to see me in about 3 months but call us in 2 weeks if not any better so we can try another medication before the next visit.  She will be traveling for 3 or 4 weeks in [place] in July.  Thank you for asking me to see [name].  Please let me know if I can be of further assistance with her or other patients in the future.  Noa Constante A. Felecia Shelling, MD, [facility] 0/01/7492, 4:96 PM Certified in Neurology, Clinical Neurophysiology, Sleep Medicine, Pain Medicine and Neuroimaging  [facility] [address] [name], [place] [address] [phone]

## 2017-06-03 NOTE — Assessment & Plan Note (Signed)
Mild to mod, for add nasacort asd,  to f/u any worsening symptoms or concerns 

## 2017-06-03 NOTE — Assessment & Plan Note (Signed)
Mild to mod, for antibx course,  to f/u any worsening symptoms or concerns 

## 2017-06-03 NOTE — Assessment & Plan Note (Signed)
D/w possible oscillopsia, etiology unclear, suspect possibly vestibular vs other, for neurology referral as she is due f/u as well

## 2017-06-08 ENCOUNTER — Encounter: Payer: Self-pay | Admitting: Neurology

## 2017-06-20 ENCOUNTER — Other Ambulatory Visit: Payer: Self-pay | Admitting: Internal Medicine

## 2017-06-20 DIAGNOSIS — K580 Irritable bowel syndrome with diarrhea: Secondary | ICD-10-CM

## 2017-06-20 MED ORDER — ACETAMINOPHEN-CODEINE #3 300-30 MG PO TABS: 1.0000 | ORAL_TABLET | Freq: Four times a day (QID) | ORAL | 0 refills | Status: DC | PRN

## 2017-06-27 ENCOUNTER — Ambulatory Visit (INDEPENDENT_AMBULATORY_CARE_PROVIDER_SITE_OTHER): Payer: BLUE CROSS/BLUE SHIELD | Admitting: General Practice

## 2017-06-27 ENCOUNTER — Other Ambulatory Visit: Payer: Self-pay | Admitting: General Practice

## 2017-06-27 DIAGNOSIS — Z5181 Encounter for therapeutic drug level monitoring: Secondary | ICD-10-CM

## 2017-06-27 LAB — POCT INR: INR: 1.8 — AB (ref 2.0–3.0)

## 2017-06-27 MED ORDER — WARFARIN SODIUM 7.5 MG PO TABS: ORAL_TABLET | ORAL | 0 refills | Status: DC

## 2017-06-27 NOTE — Patient Instructions (Addendum)
Pre visit review using our clinic review tool, if applicable. No additional management support is needed unless otherwise documented below in the visit note.  Take 15 mg today (6/26) and  then change dosage and take 1 tablet daily except 2 tablets on Wednesdays and Saturdays.  Re-check in 4 weeks.  Please take extra 1/2 tablet on the days before you fly to and from [place].

## 2017-06-28 ENCOUNTER — Other Ambulatory Visit: Payer: Self-pay | Admitting: Internal Medicine

## 2017-06-28 DIAGNOSIS — K580 Irritable bowel syndrome with diarrhea: Secondary | ICD-10-CM

## 2017-07-25 ENCOUNTER — Ambulatory Visit: Payer: BLUE CROSS/BLUE SHIELD

## 2017-07-27 ENCOUNTER — Ambulatory Visit: Payer: BLUE CROSS/BLUE SHIELD

## 2017-08-01 ENCOUNTER — Other Ambulatory Visit: Payer: Self-pay | Admitting: Internal Medicine

## 2017-08-01 NOTE — Telephone Encounter (Signed)
effexor done erx  Please let pt know, the coumadin is usually refilled by her coumadin clinic

## 2017-08-14 ENCOUNTER — Other Ambulatory Visit (INDEPENDENT_AMBULATORY_CARE_PROVIDER_SITE_OTHER): Payer: BLUE CROSS/BLUE SHIELD

## 2017-08-14 ENCOUNTER — Encounter: Payer: Self-pay | Admitting: Internal Medicine

## 2017-08-14 ENCOUNTER — Ambulatory Visit (INDEPENDENT_AMBULATORY_CARE_PROVIDER_SITE_OTHER): Payer: BLUE CROSS/BLUE SHIELD | Admitting: Internal Medicine

## 2017-08-14 VITALS — BP 120/86 | HR 70 | Temp 98.3°F | Ht 69.0 in

## 2017-08-14 DIAGNOSIS — R52 Pain, unspecified: Secondary | ICD-10-CM | POA: Diagnosis not present

## 2017-08-14 DIAGNOSIS — R509 Fever, unspecified: Secondary | ICD-10-CM

## 2017-08-14 DIAGNOSIS — K580 Irritable bowel syndrome with diarrhea: Secondary | ICD-10-CM

## 2017-08-14 DIAGNOSIS — R197 Diarrhea, unspecified: Secondary | ICD-10-CM

## 2017-08-14 LAB — CBC WITH DIFFERENTIAL/PLATELET
Basophils Absolute: 0.1 10*3/uL (ref 0.0–0.1)
Basophils Relative: 1 % (ref 0.0–3.0)
Eosinophils Absolute: 0.5 10*3/uL (ref 0.0–0.7)
Eosinophils Relative: 9 % — ABNORMAL HIGH (ref 0.0–5.0)
HCT: 35.8 % — ABNORMAL LOW (ref 36.0–46.0)
HEMOGLOBIN: 12 g/dL (ref 12.0–15.0)
Lymphocytes Relative: 31.4 % (ref 12.0–46.0)
Lymphs Abs: 1.7 10*3/uL (ref 0.7–4.0)
MCHC: 33.5 g/dL (ref 30.0–36.0)
MCV: 76.2 fl — AB (ref 78.0–100.0)
MONOS PCT: 6.9 % (ref 3.0–12.0)
Monocytes Absolute: 0.4 10*3/uL (ref 0.1–1.0)
Neutro Abs: 2.8 10*3/uL (ref 1.4–7.7)
Neutrophils Relative %: 51.7 % (ref 43.0–77.0)
Platelets: 261 10*3/uL (ref 150.0–400.0)
RBC: 4.7 Mil/uL (ref 3.87–5.11)
RDW: 16 % — ABNORMAL HIGH (ref 11.5–15.5)
WBC: 5.4 10*3/uL (ref 4.0–10.5)

## 2017-08-14 LAB — HEPATIC FUNCTION PANEL
ALT: 17 U/L (ref 0–35)
AST: 18 U/L (ref 0–37)
Albumin: 4.1 g/dL (ref 3.5–5.2)
Alkaline Phosphatase: 100 U/L (ref 39–117)
Bilirubin, Direct: 0 mg/dL (ref 0.0–0.3)
Total Bilirubin: 0.3 mg/dL (ref 0.2–1.2)
Total Protein: 6.7 g/dL (ref 6.0–8.3)

## 2017-08-14 LAB — BASIC METABOLIC PANEL WITH GFR
BUN: 16 mg/dL (ref 6–23)
CO2: 28 meq/L (ref 19–32)
Calcium: 9.1 mg/dL (ref 8.4–10.5)
Chloride: 107 meq/L (ref 96–112)
Creatinine, Ser: 1 mg/dL (ref 0.40–1.20)
GFR: 59.57 mL/min — ABNORMAL LOW
Glucose, Bld: 121 mg/dL — ABNORMAL HIGH (ref 70–99)
Potassium: 4 meq/L (ref 3.5–5.1)
Sodium: 140 meq/L (ref 135–145)

## 2017-08-14 MED ORDER — ACETAMINOPHEN-CODEINE #3 300-30 MG PO TABS: 1.0000 | ORAL_TABLET | Freq: Four times a day (QID) | ORAL | 0 refills | Status: DC | PRN

## 2017-08-14 NOTE — Assessment & Plan Note (Signed)
Ok for bridge tylenol #3

## 2017-08-14 NOTE — Assessment & Plan Note (Signed)
Also for r/o malaria as per pt request

## 2017-08-14 NOTE — Assessment & Plan Note (Signed)
Etiology unclear, ok for tylenol prn

## 2017-08-14 NOTE — Patient Instructions (Signed)
Please continue all other medications as before, and refills have been done if requested - tylenol #3.  Please have the pharmacy call with any other refills you may need.  Please continue your efforts at being more active, low cholesterol diet, and weight control..  Please keep your appointments with your specialists as you may have planned  Please go to the LAB in the Basement (turn left off the elevator) for the tests to be done today  You will be contacted by phone if any changes need to be made immediately.  Otherwise, you will receive a letter about your results with an explanation, but please check with MyChart first.  Please remember to sign up for MyChart if you have not done so, as this will be important to you in the future with finding out test results, communicating by private email, and scheduling acute appointments online when needed.

## 2017-08-14 NOTE — Progress Notes (Signed)
Subjective:    Patient ID: [name], female    DOB: 02-14-1955, 62 y.o.   MRN: [id number]  HPI  Here to f/u with vague infectious illness symptoms, with 3-4 days onset mild to mod feverish, general weak and feeling ill, HA's, chronic recurring right ear pain worsening again, and several episodes watery diarrhea, without blood, with crampy abd pains.  Admits to increased tylenol #3 use to control the diarrhea than prescribed, now 11 days short for refill.  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  Did not have other malaria prophylaxis.  Friend with her now with similar symptoms on returning the states.  Concerned about malaria after reading online, and her .  Feeling overall kind of spacy.  Eager for another round of antibx for right ear.  Has hx of contracting exotic disease with dengue fever episode about 2 yrs ago in the [employer] on a mission trip Past Medical History:  Diagnosis Date  . Abdominal pain, epigastric 10/25/2007  . ADD 01/20/2009  . ALLERGIC RHINITIS 10/15/2006  . ANGIOEDEMA 08/12/2008  . ANXIETY 10/15/2006  . ASTHMATIC BRONCHITIS, ACUTE 01/27/2008  . Cellulitis and abscess of leg, except foot 08/12/2007  . CELLULITIS, [place] 12/21/2008  . CHEST PAIN 06/14/2007  . Chronic anticoagulation 05/16/2016  . Chronic sinus infection 04/12/2010  . COLECTOMY, PARTIAL, WITH ANASTOMOSIS, HX OF 10/15/2006  . COLONIC POLYPS, HX OF 10/15/2006  . CYST, OVARIAN NEC/NOS 10/15/2006  . Dengue 02/22/2010  . DVT, HX OF 10/15/2006  . DYSAUTONOMIA 10/15/2006  . Dysfunction of eustachian tube 05/26/2009  . GI BLEEDING 04/04/2007  . Glossitis 03/06/2008  . HEADACHE, CHRONIC 04/04/2007  . Hematemesis 10/25/2007  . HEMORRHOIDS 04/04/2007  .  HYPERLIPIDEMIA 12/12/2007  . HYPERTENSION 10/15/2006  . HYPOTHYROIDISM 03/06/2008  . IBS 04/04/2007  . MASTECTOMY, BILATERAL, HX OF 08/12/2009  . NECK MASS 07/01/2007  . NEOP, MALIGNANT, FEMALE BREAST NOS 10/15/2006  . 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 SYNCOPE 07/01/2007  . Wheezing 08/20/2008  . WOUND, OPEN, LEG, WITHOUT COMPLICATION 3/71/0626   Past Surgical History:  Procedure Laterality Date  . ABDOMINAL HYSTERECTOMY    . bilateral mastectomy/reconstruction    . CESAREAN SECTION    . CHOLECYSTECTOMY    . COLONOSCOPY W/ BIOPSIES    . FLEXIBLE SIGMOIDOSCOPY    . LEFT AND RIGHT HEART CATHETERIZATION WITH CORONARY ANGIOGRAM N/A 10/07/2012   Procedure: LEFT AND RIGHT HEART CATHETERIZATION WITH CORONARY ANGIOGRAM;  Surgeon: Jolaine Artist, MD;  Location: [facility] CATH LAB;  Service: Cardiovascular;  Laterality: N/A;  . multiple ablations    . OOPHORECTOMY    . spigellian hernia      reports that she has never smoked. She has never used smokeless tobacco. She reports that she does not drink alcohol or use drugs. family history includes Cancer in her sister; Heart disease in her mother; Pancreatic cancer in her father. Allergies  Allergen Reactions  . Fish Allergy Swelling  . Latex Dermatitis    More than a day causes blisters   . Levaquin [Levofloxacin In D5w]     n/v  . Penicillins     REACTION: shock   Current Outpatient Medications on File Prior to Visit  Medication Sig Dispense Refill  .  acetaZOLAMIDE (DIAMOX) 250 MG tablet One po bid 60 tablet 5  . fluticasone (FLONASE) 50 MCG/ACT nasal spray SHAKE LIQUID AND USE 2 SPRAYS IN EACH NOSTRIL DAILY 16 g 0  . levothyroxine (SYNTHROID, LEVOTHROID) 50 MCG tablet TAKE ONE TABLET EVERY DAY BEFORE BEFORE 90 tablet 0  . meclizine (ANTIVERT) 12.5 MG tablet Take 1 tablet (12.5 mg total) by mouth 3 (three) times daily as needed for dizziness. 30 tablet 1  . metoprolol succinate (TOPROL-XL) 50 MG 24 hr tablet Take 1 tablet (50 mg total) by  mouth daily. Take 1 by mouth daily 90 tablet 1  . venlafaxine XR (EFFEXOR-XR) 150 MG 24 hr capsule TAKE ONE CAPSULE DAILY 90 capsule 3  . warfarin (COUMADIN) 7.5 MG tablet Take 1 tablet daily except 2 tablets on Wed and Fri Or as directed by anticoagulation clinic 120 tablet 0  . zolpidem (AMBIEN) 10 MG tablet TAKE 1 TABLET BY MOUTH EVERY DAY AT BEDTIME AS NEEDED 90 tablet 1   No current facility-administered medications on file prior to visit.    Review of Systems  Constitutional: Negative for other unusual diaphoresis or sweats HENT: Negative for ear discharge or swelling Eyes: Negative for other worsening visual disturbances Respiratory: Negative for stridor or other swelling  Gastrointestinal: Negative for worsening distension or other blood Genitourinary: Negative for retention or other urinary change Musculoskeletal: Negative for other MSK pain or swelling Skin: Negative for color change or other new lesions Neurological: Negative for worsening tremors and other numbness  Psychiatric/Behavioral: Negative for worsening agitation or other fatigue All other system neg per pt    Objective:   Physical Exam BP 120/86   Pulse 70   Temp 98.3 F (36.8 C) (Oral)   Ht 5\' 9"  (1.753 m)   SpO2 99%   BMI 31.75 kg/m  VS noted, mild ill, appears fatigued abd flushed Constitutional: Pt appears in NAD HENT: Head: NCAT.  Right Ear: External ear normal.  Left Ear: External ear normal.  Bilat tm's without erythema.  Max sinus areas non tender.  Pharynx without significant erythema, no exudate Eyes: . Pupils are equal, round, and reactive to light. Conjunctivae and EOM are normal Nose: without d/c or deformity Neck: Neck supple. Gross normal ROM Cardiovascular: Normal rate and regular rhythm.   Pulmonary/Chest: Effort normal and breath sounds without rales or wheezing.  Abd:  Soft, NT, ND, + BS, no organomegaly, no guarding or rebound Neurological: Pt is alert. At baseline orientation, motor  grossly intact Skin: Skin is warm. No rashes, other new lesions, no LE edema Psychiatric: Pt behavior is normal without agitation  No other exam findings Lab Results  Component Value Date   WBC 6.2 10/02/2012   HGB 13.0 10/02/2012   HCT 38.2 10/02/2012   PLT 298.0 10/02/2012   GLUCOSE 108 (H) 10/02/2012   CHOL 209 (H) 12/13/2011   TRIG 195.0 (H) 12/13/2011   HDL 45.10 12/13/2011   LDLDIRECT 137.4 12/13/2011   LDLCALC 73 02/11/2008   ALT 28 12/13/2011   AST 28 12/13/2011   NA 139 10/02/2012   K 4.1 10/02/2012   CL 106 10/02/2012   CREATININE 0.9 10/02/2012   BUN 10 10/02/2012   CO2 28 10/02/2012   TSH 3.04 10/02/2012   INR 1.8 (A) 06/27/2017   HGBA1C  08/12/2007    5.2 (NOTE)   The ADA recommends the following therapeutic goal for glycemic   control related to Hgb A1C measurement:   Goal of Therapy:   < 7.0% Hgb  A1C   Reference: American Diabetes Association: Clinical Practice   Recommendations 2008, Diabetes Care,  2008, 31:(Suppl 1).          Assessment & Plan:

## 2017-08-14 NOTE — Assessment & Plan Note (Addendum)
Etiology unclear, ? Viral vs other, for GI panel, stool cx, ova and parasite exam as well Plasmodium PCR testing,  to f/u any worsening symptoms or concerns, no indication today for antibx  Note:  Total time for pt hx, exam, review of record with pt in the room, determination of diagnoses and plan for further eval and tx is > 40 min, with over 50% spent in coordination and counseling of patient including the differential dx, tx, further evaluation and other management of acute probably infectious diarrhea, IBS, fever, whole body pain

## 2017-08-15 ENCOUNTER — Other Ambulatory Visit: Payer: BLUE CROSS/BLUE SHIELD

## 2017-08-15 DIAGNOSIS — R197 Diarrhea, unspecified: Secondary | ICD-10-CM | POA: Diagnosis not present

## 2017-08-15 DIAGNOSIS — R509 Fever, unspecified: Secondary | ICD-10-CM | POA: Diagnosis not present

## 2017-08-16 ENCOUNTER — Encounter: Payer: Self-pay | Admitting: Internal Medicine

## 2017-08-17 MED ORDER — METRONIDAZOLE 250 MG PO TABS: 250.0000 mg | ORAL_TABLET | Freq: Three times a day (TID) | ORAL | 0 refills | Status: AC

## 2017-08-20 LAB — STOOL CULTURE
MICRO NUMBER: 90965597
MICRO NUMBER:: 90965598
MICRO NUMBER:: 90965600
SHIGA RESULT: NOT DETECTED
SPECIMEN QUALITY: ADEQUATE
SPECIMEN QUALITY:: ADEQUATE
SPECIMEN QUALITY:: ADEQUATE

## 2017-08-20 LAB — GASTROINTESTINAL PATHOGEN PANEL PCR
C. difficile Tox A/B, PCR: NOT DETECTED
Campylobacter, PCR: NOT DETECTED
Cryptosporidium, PCR: NOT DETECTED
E COLI (STEC) STX1/STX2, PCR: NOT DETECTED
E coli (ETEC) LT/ST PCR: NOT DETECTED
E coli 0157, PCR: NOT DETECTED
Giardia lamblia, PCR: NOT DETECTED
Norovirus, PCR: NOT DETECTED
ROTAVIRUS, PCR: NOT DETECTED
SALMONELLA, PCR: NOT DETECTED
SHIGELLA, PCR: NOT DETECTED

## 2017-08-20 LAB — OVA AND PARASITE EXAMINATION
CONCENTRATE RESULT:: NONE SEEN
SPECIMEN QUALITY: ADEQUATE
TRICHROME RESULT: NONE SEEN
VKL: 90965599

## 2017-08-23 DIAGNOSIS — I421 Obstructive hypertrophic cardiomyopathy: Secondary | ICD-10-CM | POA: Diagnosis not present

## 2017-08-23 DIAGNOSIS — Z954 Presence of other heart-valve replacement: Secondary | ICD-10-CM | POA: Diagnosis not present

## 2017-08-23 DIAGNOSIS — I422 Other hypertrophic cardiomyopathy: Secondary | ICD-10-CM | POA: Diagnosis not present

## 2017-08-23 DIAGNOSIS — I480 Paroxysmal atrial fibrillation: Secondary | ICD-10-CM | POA: Diagnosis not present

## 2017-08-27 ENCOUNTER — Other Ambulatory Visit: Payer: Self-pay | Admitting: Internal Medicine

## 2017-08-27 DIAGNOSIS — K580 Irritable bowel syndrome with diarrhea: Secondary | ICD-10-CM

## 2017-08-27 MED ORDER — ACETAMINOPHEN-CODEINE #3 300-30 MG PO TABS: 1.0000 | ORAL_TABLET | Freq: Four times a day (QID) | ORAL | 0 refills | Status: DC | PRN

## 2017-08-27 NOTE — Telephone Encounter (Signed)
Done erx 

## 2017-09-04 ENCOUNTER — Ambulatory Visit (INDEPENDENT_AMBULATORY_CARE_PROVIDER_SITE_OTHER): Payer: BLUE CROSS/BLUE SHIELD | Admitting: Gynecology

## 2017-09-04 ENCOUNTER — Ambulatory Visit: Payer: Self-pay

## 2017-09-04 ENCOUNTER — Encounter: Payer: Self-pay | Admitting: Gynecology

## 2017-09-04 VITALS — BP 120/74 | Ht 68.0 in | Wt 214.0 lb

## 2017-09-04 DIAGNOSIS — N939 Abnormal uterine and vaginal bleeding, unspecified: Secondary | ICD-10-CM

## 2017-09-04 DIAGNOSIS — Z1272 Encounter for screening for malignant neoplasm of vagina: Secondary | ICD-10-CM

## 2017-09-04 NOTE — Telephone Encounter (Signed)
Incoming call from patient, who states that she is experiencing vaginal bleeding who is post hysterectomy and diarrhea. States she had " 20 bouts of diarrhea stools."  Was recently in [place]" Patient states the Bleeding is moderate.  Started last night around 10 pm last night.   Patient has been in menopause for 22 years, related to a hysterectomy. Patient rates pain as mild/cramping.  Patient felt like cramping was due to diarrhea.  Patient states she is on warfarin.  Denies any hormone prescriptions. Patient states " I have no idea what's causing the bleeding'.  Thought it was the hemorrhoids. Until I looked with the mirror" .  Patient states that she does feel weak at times.  Yet she can stand.  States she does feel "dizzy at times also".  Felt  Like " passing out a couple of times".  Denies burning with urination, fever, back pain  from diarrhea. States not saturating a pad every hour.  Yet flow is moderate.  Per protocol recommended that patient   Go to ED for evaluation.  provided care  Advice. Patient voiced  understanding.       Reason for Disposition . SEVERE dizziness (e.g., unable to stand, requires support to walk, feels like passing out now)  Answer Assessment - Initial Assessment Questions 1. AMOUNT: "Describe the bleeding that you are having." "How much bleeding is there?"    - SPOTTING: spotting, or pinkish / brownish mucous discharge; does not fill panti-liner or pad    - MILD:  less than 1 pad / hour; less than patient's  menstrual bleeding when she still had menstrual periods   - MODERATE: 1-2 pads / hour; small-medium blood clots (e.g., pea, grape, small coin)    - SEVERE: soaking 2 or more pads/hour for 2 or more hours; bleeding not contained by pads or continuous red blood from vagina; large blood clots (e.g., golf ball, large coin)      moderate 2. ONSET: "When did the bleeding begin?" "Is it continuing now?"     Last nite about ten pm  3. MENOPAUSE: "When was your last  menstrual period?"      Menopause 21 year 4. ABDOMINAL PAIN: "Do you have any pain?" "How bad is the pain?"  (e.g., Scale 1-10; mild, moderate, or severe)   - MILD (1-3): doesn't interfere with normal activities, abdomen soft and not tender to touch    - MODERATE (4-7): interferes with normal activities or awakens from sleep, tender to touch    - SEVERE (8-10): excruciating pain, doubled over, unable to do any normal activities      Cramping thought it was due to diarrea 5. BLOOD THINNERS: "Do you take any blood thinners?" (e.g., Coumadin/warfarin, Pradaxa/dabigatran, aspirin)     warfrin 6. HORMONES: "Are you taking any hormone medications, prescription or OTC?" (e.g., birth control pills, estrogen)     no 7. CAUSE: "What do you think is causing the bleeding?" (e.g., recent gyn surgery, recent gyn procedure; known bleeding disorder, uterine cancer)       I have no idea, thought it was hemorrhoids 8. HEMODYNAMIC STATUS: "Are you weak or feeling lightheaded?" If so, ask: "Can you stand and walk normally?"       *No Answer*do feel weak  But cant to stand. 9. OTHER SYMPTOMS: "What other symptoms are you having with the bleeding?" (e.g., back pain, burning with urination, fever) no burning with urination, back pain from diarrhea - fever     *No Answer*  Protocols used: VAGINAL BLEEDING - POSTMENOPAUSAL-A-AH

## 2017-09-04 NOTE — Addendum Note (Signed)
Addended by: Nelva Nay on: 09/04/2017 04:38 PM   Modules accepted: Orders

## 2017-09-04 NOTE — Progress Notes (Signed)
    [name] 05/07/1955 [id number]        62 y.o.  [id number] who has not been in the office for 10 years or so.  History of hysterectomy with BSO.  Has been having issues with diarrhea and had a significant bout yesterday where she had over 10-15 episodes of diarrhea.  She noticed vaginal bleeding starting yesterday up to a point of a light flow.  Continued through the evening and now seems to be resolving.  No significant abdominal/pelvic discomfort or cramping.  No history of same before.  Is on Coumadin with history of cardiac pacemaker and history of DVT.  Past medical history,surgical history, problem list, medications, allergies, family history and social history were all reviewed and documented in the EPIC chart.  Directed ROS with pertinent positives and negatives documented in the history of present illness/assessment and plan.  Exam: Caryn Bee assistant Vitals:   09/04/17 1557  BP: 120/74  Weight: 214 lb (97.1 kg)  Height: 5\' 8"  (1.727 m)   General appearance:  Normal Abdomen soft nontender without masses guarding rebound Pelvic external BUS vagina with atrophic changes.  Mild blood staining noted along vaginal mucosa but no specific bleeding point or any visualized/palpated lesions noted.  Bimanual without masses or tenderness.  Rectal exam is normal.  Assessment/Plan:  62 y.o. [id number] episode of vaginal bleeding associated with significant bouts of diarrhea and patient anticoagulated.  Exam is unable to to identify any specific area of bleeding or concern.  No visual or palpable lesions.  I did a Pap smear of the vaginal vault/cuff.  At this point I recommend observation as her bleeding has resolved.  I suspect at this point it was due to to atrophic changes with fragility of the mucosa being challenged with the diarrhea leading to the bleeding.  Alternatives to include lesions discussed.  We will follow-up on Pap smear.  If any recurrence in the bleeding she knows to call  and we will pursue colposcopy with closer inspection of the vaginal tissues.    Anastasio Auerbach MD, 4:25 PM 09/04/2017

## 2017-09-04 NOTE — Telephone Encounter (Signed)
FYI

## 2017-09-04 NOTE — Patient Instructions (Signed)
Call if any further vaginal bleeding

## 2017-09-05 ENCOUNTER — Encounter: Payer: Self-pay | Admitting: Internal Medicine

## 2017-09-05 LAB — PAP IG W/ RFLX HPV ASCU

## 2017-09-05 MED ORDER — METRONIDAZOLE 250 MG PO TABS: 250.0000 mg | ORAL_TABLET | Freq: Three times a day (TID) | ORAL | 0 refills | Status: DC

## 2017-09-06 ENCOUNTER — Encounter: Payer: Self-pay | Admitting: Neurology

## 2017-09-06 ENCOUNTER — Other Ambulatory Visit: Payer: Self-pay | Admitting: Internal Medicine

## 2017-09-06 ENCOUNTER — Telehealth: Payer: Self-pay | Admitting: Neurology

## 2017-09-06 ENCOUNTER — Other Ambulatory Visit: Payer: Self-pay

## 2017-09-06 ENCOUNTER — Ambulatory Visit (INDEPENDENT_AMBULATORY_CARE_PROVIDER_SITE_OTHER): Payer: BLUE CROSS/BLUE SHIELD | Admitting: Neurology

## 2017-09-06 VITALS — BP 142/88 | HR 64 | Resp 16 | Ht 68.0 in | Wt 215.5 lb

## 2017-09-06 DIAGNOSIS — G43109 Migraine with aura, not intractable, without status migrainosus: Secondary | ICD-10-CM | POA: Diagnosis not present

## 2017-09-06 DIAGNOSIS — R5383 Other fatigue: Secondary | ICD-10-CM

## 2017-09-06 DIAGNOSIS — H519 Unspecified disorder of binocular movement: Secondary | ICD-10-CM

## 2017-09-06 DIAGNOSIS — H539 Unspecified visual disturbance: Secondary | ICD-10-CM | POA: Diagnosis not present

## 2017-09-06 NOTE — Telephone Encounter (Signed)
FYI Pt husband(Frank Bebee/on DPR) called to inform that due to some accidents in [facility] they would be about 10 mins late.  RN Faith confirmed that pt would need to r/s.  This message was relayed to husband.  Mr Forness was informed Dr Felecia Shelling would not be able to see pt today.  Mr Pontarelli response was that they would be here as soon as they could and he then ended the call.

## 2017-09-06 NOTE — Telephone Encounter (Signed)
Pt. arrived for her appt. at 3:15 and was seen/fim

## 2017-09-06 NOTE — Progress Notes (Signed)
[facility]  PATIENT: [name] DOB: [birth year]  REFERRING DOCTOR OR PCP:  Cathlean Cower SOURCE: Patient, notes from Dr. [name], CT and lab results, CT images on PACS personally reviewed.  _________________________________   HISTORICAL  CHIEF COMPLAINT:  Chief Complaint  Patient presents with  . Headache    Metoprolol didn't help h/a's.  Acetazolamide helped but caused gi upset so she stopped/fim    HISTORY OF PRESENT ILLNESS:  [name] is a 62yo woman with episodes of abnormal eye movements with vertigo.  Update 09/06/2017: She has had multiple episodes of abnormal eye movements and frequent flashing in the visual field.   These were felt to be ocular migraines.  Diamox helped the episodes some but she did not tolerate it well due to GI side effects.   She is already metoprolol and it does not help.    She is still having the spells of visual scintillations daily.  This will occur in the visual field, either to the left or the right.  It lasts for about 15 minutes and then improves.   She also has the oculogyric spells 1-2 times a week    these also last about 15 minutes and are associated with vertigo.  If she is driving she needs to pull over.  The spells are not triggered by activity.   They occur while sitting or standing but not while laying down.    She does not experience headaches with these.      From 05/31/2017: She is a 62 yo woman who has had several episodes of abnormal eye movements since around August 2018.   The episode lasts 1-3 minutes and she feels she cannot walk for 15 minutes or so after each minute due to severe vertigo. She also notes lip numbness and left arm (rarely arm and leg) numbness for a few minutes while she is dizzy.   She sometimes gets a headache.   She is experiencing them 1-2 times a week.    She does not take nausea or anti-psychotic medications.    She has been prescribed meclizine but has never taken it.    She gets a  sensation like someone is pushing a finger into her upper chest above the sternum when these episodes occur.     She has near daily episodes of flashing in her vision since open heart surgery April 19, 2016.   These spells started while in the hospital.   They have been called ocular migraines.   She does not get a headache but notes some eye pain for a bout 1 hour when this occurs.      I personally reviewed the CT scan of the head and sinuses dated 09/13/2016.  The brain is normal for age.  There is no significant atrophy and no evidence of stroke or other changes.  The sinuses were normal.  The internal auditory canals appear normal.  She has a strong FH of migraines but mother and sister do not get aura.        She has a pacemaker.     She had dysautonomia in the past.     REVIEW OF SYSTEMS: Constitutional: No fevers, chills, sweats, or change in appetite Eyes: No visual changes, double vision, eye pain Ear, nose and throat: No hearing loss or ear pain.  Vertigo as above. Cardiovascular: No chest pain, palpitations Respiratory: No shortness of breath at rest or with exertion.   No wheezes GastrointestinaI: No nausea, vomiting,  diarrhea, abdominal pain, fecal incontinence Genitourinary: No dysuria, urinary retention or frequency.  No nocturia. Musculoskeletal: No neck pain, back pain Integumentary: No rash, pruritus, skin lesions Neurological: as above Psychiatric: No depression at this time.  No anxiety Endocrine: No palpitations, diaphoresis, change in appetite, change in weigh or increased thirst Hematologic/Lymphatic: No anemia, purpura, petechiae. Allergic/Immunologic: No itchy/runny eyes, nasal congestion, recent allergic reactions, rashes  ALLERGIES: Allergies  Allergen Reactions  . Fish Allergy Swelling  . Latex Dermatitis    More than a day causes blisters   . Levaquin [Levofloxacin In D5w]     n/v  . Penicillins     REACTION: shock    HOME  MEDICATIONS:  Current Outpatient Medications:  .  acetaminophen-codeine (TYLENOL #3) 300-30 MG tablet, Take 1 tablet by mouth every 6 (six) hours as needed. For bridge 11 days, Disp: 120 tablet, Rfl: 0 .  fluticasone (FLONASE) 50 MCG/ACT nasal spray, SHAKE LIQUID AND USE 2 SPRAYS IN EACH NOSTRIL DAILY, Disp: 16 g, Rfl: 0 .  levothyroxine (SYNTHROID, LEVOTHROID) 50 MCG tablet, TAKE ONE TABLET EVERY DAY BEFORE BEFORE, Disp: 90 tablet, Rfl: 0 .  levothyroxine (SYNTHROID, LEVOTHROID) 50 MCG tablet, TAKE ONE TABLET EVERY DAY BEFORE BEFORE, Disp: 90 tablet, Rfl: 0 .  losartan (COZAAR) 25 MG tablet, Take 25 mg by mouth daily., Disp: , Rfl:  .  metoprolol succinate (TOPROL-XL) 50 MG 24 hr tablet, Take 1 tablet (50 mg total) by mouth daily. Take 1 by mouth daily, Disp: 90 tablet, Rfl: 1 .  metroNIDAZOLE (FLAGYL) 250 MG tablet, Take 1 tablet (250 mg total) by mouth 3 (three) times daily., Disp: 30 tablet, Rfl: 0 .  venlafaxine XR (EFFEXOR-XR) 150 MG 24 hr capsule, TAKE ONE CAPSULE DAILY, Disp: 90 capsule, Rfl: 3 .  warfarin (COUMADIN) 7.5 MG tablet, Take 1 tablet daily except 2 tablets on Wed and Fri Or as directed by anticoagulation clinic, Disp: 120 tablet, Rfl: 0 .  acetaZOLAMIDE (DIAMOX) 250 MG tablet, One po bid (Patient not taking: Reported on 09/06/2017), Disp: 60 tablet, Rfl: 5 .  zolpidem (AMBIEN) 10 MG tablet, TAKE 1 TABLET BY MOUTH EVERY DAY AT BEDTIME AS NEEDED (Patient not taking: Reported on 09/06/2017), Disp: 90 tablet, Rfl: 1  PAST MEDICAL HISTORY: Past Medical History:  Diagnosis Date  . Abdominal pain, epigastric 10/25/2007  . ADD 01/20/2009  . ALLERGIC RHINITIS 10/15/2006  . ANGIOEDEMA 08/12/2008  . ANXIETY 10/15/2006  . ASTHMATIC BRONCHITIS, ACUTE 01/27/2008  . Cellulitis and abscess of leg, except foot 08/12/2007  . CELLULITIS, [place] 12/21/2008  . CHEST PAIN 06/14/2007  . Chronic anticoagulation 05/16/2016  . Chronic sinus infection 04/12/2010  . COLECTOMY, PARTIAL, WITH ANASTOMOSIS, HX  OF 10/15/2006  . COLONIC POLYPS, HX OF 10/15/2006  . CYST, OVARIAN NEC/NOS 10/15/2006  . Dengue 02/22/2010  . DVT, HX OF 10/15/2006  . DYSAUTONOMIA 10/15/2006  . Dysfunction of eustachian tube 05/26/2009  . GI BLEEDING 04/04/2007  . Glossitis 03/06/2008  . HEADACHE, CHRONIC 04/04/2007  . Hematemesis 10/25/2007  . HEMORRHOIDS 04/04/2007  . HYPERLIPIDEMIA 12/12/2007  . HYPERTENSION 10/15/2006  . HYPOTHYROIDISM 03/06/2008  . IBS 04/04/2007  . MASTECTOMY, BILATERAL, HX OF 08/12/2009  . NECK MASS 07/01/2007  . NEOP, MALIGNANT, FEMALE BREAST NOS 10/15/2006  . 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 SYNCOPE 07/01/2007  . Wheezing 08/20/2008  . WOUND, OPEN, LEG, WITHOUT COMPLICATION 0/93/8182    PAST SURGICAL HISTORY: Past Surgical History:  Procedure Laterality Date  . ABDOMINAL HYSTERECTOMY    . bilateral mastectomy/reconstruction    .  CESAREAN SECTION    . CHOLECYSTECTOMY    . COLONOSCOPY W/ BIOPSIES    . FLEXIBLE SIGMOIDOSCOPY    . LEFT AND RIGHT HEART CATHETERIZATION WITH CORONARY ANGIOGRAM N/A 10/07/2012   Procedure: LEFT AND RIGHT HEART CATHETERIZATION WITH CORONARY ANGIOGRAM;  Surgeon: Jolaine Artist, MD;  Location: [facility] CATH LAB;  Service: Cardiovascular;  Laterality: N/A;  . multiple ablations    . OOPHORECTOMY    . open heart surg    . spigellian hernia      FAMILY HISTORY: Family History  Problem Relation Age of Onset  . Heart disease Mother   . Cancer Sister        ovarian cancer  . Pancreatic cancer Father     SOCIAL HISTORY:  Social History   Socioeconomic History  . Marital status: Married    Spouse name: Not on file  . Number of children: Not on file  . Years of education: Not on file  . Highest education level: Not on file  Occupational History  . Not on file  Social Needs  . Financial resource strain: Not on file  . Food insecurity:    Worry: Not on file    Inability: Not on file  . Transportation needs:    Medical: Not on file    Non-medical: Not on file   Tobacco Use  . Smoking status: Never Smoker  . Smokeless tobacco: Never Used  Substance and Sexual Activity  . Alcohol use: No  . Drug use: No  . Sexual activity: Not Currently  Lifestyle  . Physical activity:    Days per week: Not on file    Minutes per session: Not on file  . Stress: Not on file  Relationships  . Social connections:    Talks on phone: Not on file    Gets together: Not on file    Attends religious service: Not on file    Active member of club or organization: Not on file    Attends meetings of clubs or organizations: Not on file    Relationship status: Not on file  . Intimate partner violence:    Fear of current or ex partner: Not on file    Emotionally abused: Not on file    Physically abused: Not on file    Forced sexual activity: Not on file  Other Topics Concern  . Not on file  Social History Narrative   [place] a few times per year.   Non smoking           PHYSICAL EXAM  Vitals:   09/06/17 1603  BP: (!) 142/88  Pulse: 64  Resp: 16  Weight: 215 lb 8 oz (97.8 kg)  Height: 5\' 8"  (1.727 m)    Body mass index is 32.77 kg/m.   General: The patient is well-developed and well-nourished and in no acute distress.       Neurologic Exam  Mental status: The patient is alert and oriented x 3 at the time of the examination. The patient has apparent normal recent and remote memory, with an apparently normal attention span and concentration ability.   Speech is normal.  Cranial nerves: Extraocular movements are full.  Facial strength and sensation was normal.  Trapezius strength was normal.  No dysarthria is noted.  The tongue is midline, and the patient has symmetric elevation of the soft palate. No obvious hearing deficits are noted.  Motor:  Muscle bulk is normal.   Tone is normal. Strength is  5 / 5 in all 4 extremities.   Sensory: Sensory testing is intact to pinprick, soft touch and vibration sensation in all 4  extremities.  Coordination: Cerebellar testing reveals good finger-nose-finger and heel-to-shin bilaterally.  Gait and station: Station is normal.  The gait and the tandem gait are normal.  Romberg is negative.   Reflexes: Deep tendon reflexes are symmetric and normal bilaterally.        DIAGNOSTIC DATA (LABS, IMAGING, TESTING) - I reviewed patient records, labs, notes, testing and imaging myself where available.  Lab Results  Component Value Date   WBC 5.4 08/14/2017   HGB 12.0 08/14/2017   HCT 35.8 (L) 08/14/2017   MCV 76.2 (L) 08/14/2017   PLT 261.0 08/14/2017      Component Value Date/Time   NA 140 08/14/2017 1641   K 4.0 08/14/2017 1641   CL 107 08/14/2017 1641   CO2 28 08/14/2017 1641   GLUCOSE 121 (H) 08/14/2017 1641   BUN 16 08/14/2017 1641   CREATININE 1.00 08/14/2017 1641   CALCIUM 9.1 08/14/2017 1641   PROT 6.7 08/14/2017 1641   ALBUMIN 4.1 08/14/2017 1641   AST 18 08/14/2017 1641   ALT 17 08/14/2017 1641   ALKPHOS 100 08/14/2017 1641   BILITOT 0.3 08/14/2017 1641   GFRNONAA 70 02/11/2008 1444   GFRAA 84 02/11/2008 1444   Lab Results  Component Value Date   CHOL 209 (H) 12/13/2011   HDL 45.10 12/13/2011   LDLCALC 73 02/11/2008   LDLDIRECT 137.4 12/13/2011   TRIG 195.0 (H) 12/13/2011   CHOLHDL 5 12/13/2011   Lab Results  Component Value Date   HGBA1C  08/12/2007    5.2 (NOTE)   The ADA recommends the following therapeutic goal for glycemic   control related to Hgb A1C measurement:   Goal of Therapy:   < 7.0% Hgb A1C   Reference: American Diabetes Association: Clinical Practice   Recommendations 2008, Diabetes Care,  2008, 31:(Suppl 1).   Lab Results  Component Value Date   CBULAGTX64 680 02/11/2008   Lab Results  Component Value Date   TSH 3.04 10/02/2012       ASSESSMENT AND PLAN  Ocular motility disturbance  Other fatigue  Migraine equivalent syndrome  Vision disturbance  1.    She appears to be having migraine equivalents.   She received benefit from Diamox but had trouble tolerating.  I will have her restart with a lower dose.  We can try to slowly increase it to see if she tolerates it better.  If unable to tolerate it at a dose high enough to be effective, consider Topamax or zonisamide. 2.    She will return to see me in 5 months or sooner if there are new or worsening neurologic symptoms.  Ruthia Person A. Felecia Shelling, MD, [facility] 03/04/1222, 8:25 PM Certified in Neurology, Clinical Neurophysiology, Sleep Medicine, Pain Medicine and Neuroimaging  [facility] [address], [place], [place] [address] [phone]

## 2017-09-12 ENCOUNTER — Ambulatory Visit (INDEPENDENT_AMBULATORY_CARE_PROVIDER_SITE_OTHER): Payer: BLUE CROSS/BLUE SHIELD | Admitting: Internal Medicine

## 2017-09-12 ENCOUNTER — Other Ambulatory Visit (INDEPENDENT_AMBULATORY_CARE_PROVIDER_SITE_OTHER): Payer: BLUE CROSS/BLUE SHIELD

## 2017-09-12 ENCOUNTER — Encounter: Payer: Self-pay | Admitting: Internal Medicine

## 2017-09-12 VITALS — BP 122/84 | HR 78 | Temp 97.8°F | Ht 68.0 in

## 2017-09-12 DIAGNOSIS — J328 Other chronic sinusitis: Secondary | ICD-10-CM

## 2017-09-12 DIAGNOSIS — R197 Diarrhea, unspecified: Secondary | ICD-10-CM

## 2017-09-12 DIAGNOSIS — N939 Abnormal uterine and vaginal bleeding, unspecified: Secondary | ICD-10-CM | POA: Diagnosis not present

## 2017-09-12 DIAGNOSIS — Z7901 Long term (current) use of anticoagulants: Secondary | ICD-10-CM

## 2017-09-12 LAB — CBC WITH DIFFERENTIAL/PLATELET
BASOS PCT: 1 % (ref 0.0–3.0)
Basophils Absolute: 0.1 10*3/uL (ref 0.0–0.1)
EOS PCT: 4.4 % (ref 0.0–5.0)
Eosinophils Absolute: 0.3 10*3/uL (ref 0.0–0.7)
HCT: 38.4 % (ref 36.0–46.0)
Hemoglobin: 13 g/dL (ref 12.0–15.0)
Lymphocytes Relative: 25.4 % (ref 12.0–46.0)
Lymphs Abs: 1.9 10*3/uL (ref 0.7–4.0)
MCHC: 33.8 g/dL (ref 30.0–36.0)
MCV: 76.1 fl — ABNORMAL LOW (ref 78.0–100.0)
MONO ABS: 0.5 10*3/uL (ref 0.1–1.0)
Monocytes Relative: 6.9 % (ref 3.0–12.0)
Neutro Abs: 4.6 10*3/uL (ref 1.4–7.7)
Neutrophils Relative %: 62.3 % (ref 43.0–77.0)
Platelets: 333 10*3/uL (ref 150.0–400.0)
RBC: 5.04 Mil/uL (ref 3.87–5.11)
RDW: 16.5 % — ABNORMAL HIGH (ref 11.5–15.5)
WBC: 7.5 10*3/uL (ref 4.0–10.5)

## 2017-09-12 LAB — BASIC METABOLIC PANEL
BUN: 17 mg/dL (ref 6–23)
CALCIUM: 9.3 mg/dL (ref 8.4–10.5)
CO2: 22 mEq/L (ref 19–32)
Chloride: 105 mEq/L (ref 96–112)
Creatinine, Ser: 1.03 mg/dL (ref 0.40–1.20)
GFR: 57.55 mL/min — AB (ref >60.00)
Glucose, Bld: 101 mg/dL — ABNORMAL HIGH (ref 70–99)
Potassium: 3.9 mEq/L (ref 3.5–5.1)
SODIUM: 134 meq/L — AB (ref 135–145)

## 2017-09-12 LAB — PROTIME-INR
INR: 4.4 ratio — AB (ref 0.8–1.0)
Prothrombin Time: 50 s — ABNORMAL HIGH (ref 9.6–13.1)

## 2017-09-12 MED ORDER — CEFUROXIME AXETIL 250 MG PO TABS: 250.0000 mg | ORAL_TABLET | Freq: Two times a day (BID) | ORAL | 0 refills | Status: AC

## 2017-09-12 NOTE — Assessment & Plan Note (Signed)
Suspect possible increased INR with recent vaginal bleeding and neg exam for malignancy; for f/u INR today, pt advised to have regular INR follow up as recommended, remains on coumadin permanently with metal valve

## 2017-09-12 NOTE — Assessment & Plan Note (Addendum)
Worsening post [place] trip but has been chronic, recent stool studies neg but will repeat the c diff, pt requests refer GI

## 2017-09-12 NOTE — Progress Notes (Signed)
Subjective:    Patient ID: [name], female    DOB: 1955/05/25, 62 y.o.   MRN: [id number]  HPI   Here with 2-3 days acute onset fever, facial pain, pressure, headache, general weakness and malaise, and greenish d/c, with mild ST and cough, but pt denies chest pain, wheezing, increased sob or doe, orthopnea, PND, increased LE swelling, palpitations, dizziness or syncope. Just saw neurology last wk Dr Melene Plan Neuro with planned f/u soon.  Coincidently also with watery diarrhea x 2 months since return from [place] with recent Gi panel/cx/ova and parasite exam neg, and also recent post menopausal BRB "like a period."  Saw GYN Dr Phineas Real who warned her about malignancy but neg pap  per pt report, s/p TAH, No INR done, pt admits to no f/u with coumadin clinic recently as advised,  Also Step dad died today, she is trying to help her mother, has ongoing multiple stressors Past Medical History:  Diagnosis Date  . Abdominal pain, epigastric 10/25/2007  . ADD 01/20/2009  . ALLERGIC RHINITIS 10/15/2006  . ANGIOEDEMA 08/12/2008  . ANXIETY 10/15/2006  . ASTHMATIC BRONCHITIS, ACUTE 01/27/2008  . Cellulitis and abscess of leg, except foot 08/12/2007  . CELLULITIS, [place] 12/21/2008  . CHEST PAIN 06/14/2007  . Chronic anticoagulation 05/16/2016  . Chronic sinus infection 04/12/2010  . COLECTOMY, PARTIAL, WITH ANASTOMOSIS, HX OF 10/15/2006  . COLONIC POLYPS, HX OF 10/15/2006  . CYST, OVARIAN NEC/NOS 10/15/2006  . Dengue 02/22/2010  . DVT, HX OF 10/15/2006  . DYSAUTONOMIA 10/15/2006  . Dysfunction of eustachian tube 05/26/2009  . GI BLEEDING 04/04/2007  . Glossitis 03/06/2008  . HEADACHE, CHRONIC 04/04/2007  . Hematemesis 10/25/2007  . HEMORRHOIDS 04/04/2007  . HYPERLIPIDEMIA 12/12/2007  . HYPERTENSION 10/15/2006  . HYPOTHYROIDISM 03/06/2008  . IBS 04/04/2007  . MASTECTOMY, BILATERAL, HX OF 08/12/2009  . NECK MASS 07/01/2007  . NEOP, MALIGNANT, FEMALE BREAST NOS 10/15/2006  . NEOP, MALIGNANT, THYMUS  10/15/2006  . SYNCOPE 07/01/2007  . Wheezing 08/20/2008  . WOUND, OPEN, LEG, WITHOUT COMPLICATION 8/52/7782   Past Surgical History:  Procedure Laterality Date  . ABDOMINAL HYSTERECTOMY    . bilateral mastectomy/reconstruction    . CESAREAN SECTION    . CHOLECYSTECTOMY    . COLONOSCOPY W/ BIOPSIES    . FLEXIBLE SIGMOIDOSCOPY    . LEFT AND RIGHT HEART CATHETERIZATION WITH CORONARY ANGIOGRAM N/A 10/07/2012   Procedure: LEFT AND RIGHT HEART CATHETERIZATION WITH CORONARY ANGIOGRAM;  Surgeon: Jolaine Artist, MD;  Location: [facility] CATH LAB;  Service: Cardiovascular;  Laterality: N/A;  . multiple ablations    . OOPHORECTOMY    . open heart surg    . spigellian hernia      reports that she has never smoked. She has never used smokeless tobacco. She reports that she does not drink alcohol or use drugs. family history includes Cancer in her sister; Heart disease in her mother; Pancreatic cancer in her father. Allergies  Allergen Reactions  . Fish Allergy Swelling  . Latex Dermatitis    More than a day causes blisters   . Levaquin [Levofloxacin In D5w]     n/v  . Penicillins     REACTION: shock   Current Outpatient Medications on File Prior to Visit  Medication Sig Dispense Refill  . acetaminophen-codeine (TYLENOL #3) 300-30 MG tablet Take 1 tablet by mouth every 6 (six) hours as needed. For bridge 11 days 120 tablet 0  . acetaZOLAMIDE (DIAMOX) 250 MG tablet One po bid 60 tablet 5  .  fluticasone (FLONASE) 50 MCG/ACT nasal spray SHAKE LIQUID AND USE 2 SPRAYS IN EACH NOSTRIL DAILY 16 g 0  . levothyroxine (SYNTHROID, LEVOTHROID) 50 MCG tablet TAKE ONE TABLET EVERY DAY BEFORE BEFORE 90 tablet 0  . losartan (COZAAR) 25 MG tablet Take 25 mg by mouth daily.    . metoprolol succinate (TOPROL-XL) 50 MG 24 hr tablet Take 1 tablet (50 mg total) by mouth daily. Take 1 by mouth daily 90 tablet 1  . metroNIDAZOLE (FLAGYL) 250 MG tablet Take 1 tablet (250 mg total) by mouth 3 (three) times daily. 30  tablet 0  . venlafaxine XR (EFFEXOR-XR) 150 MG 24 hr capsule TAKE ONE CAPSULE DAILY 90 capsule 3  . warfarin (COUMADIN) 7.5 MG tablet Take 1 tablet daily except 2 tablets on Wed and Fri Or as directed by anticoagulation clinic 120 tablet 0  . zolpidem (AMBIEN) 10 MG tablet TAKE 1 TABLET BY MOUTH EVERY DAY AT BEDTIME AS NEEDED 90 tablet 1   No current facility-administered medications on file prior to visit.   ROS:  Constitutional: Negative for other unusual diaphoresis or sweats HENT: Negative for ear discharge or swelling Eyes: Negative for other worsening visual disturbances Respiratory: Negative for stridor or other swelling  Gastrointestinal: Negative for worsening distension or other blood Genitourinary: Negative for retention or other urinary change Musculoskeletal: Negative for other MSK pain or swelling Skin: Negative for color change or other new lesions Neurological: Negative for worsening tremors and other numbness  Psychiatric/Behavioral: Negative for worsening agitation or other fatigue All other system neg per pt    Objective:   Physical Exam BP 122/84   Pulse 78   Temp 97.8 F (36.6 C) (Oral)   Ht 5\' 8"  (1.727 m)   SpO2 98%   BMI 32.77 kg/m  VS noted, mild ill Constitutional: Pt appears in NAD HENT: Head: NCAT.  Right Ear: External ear normal.  Left Ear: External ear normal.  Eyes: . Pupils are equal, round, and reactive to light. Conjunctivae and EOM are normal Bilat tm's with mod erythema and effusions.  Max sinus areas mild tender.  Pharynx with mild erythema, no exudate Nose: without d/c or deformity Neck: Neck supple. Gross normal ROM Cardiovascular: Normal rate and regular rhythm.   Pulmonary/Chest: Effort normal and breath sounds without rales or wheezing.  Abd:  Soft, NT, ND, + BS, no organomegaly - benign Neurological: Pt is alert. At baseline orientation, motor grossly intact Skin: Skin is warm. No rashes, other new lesions, no LE edema Psychiatric:  Pt behavior is normal without agitation  No other exam findings     Assessment & Plan:

## 2017-09-12 NOTE — Assessment & Plan Note (Signed)
With flare, Mild to mod, for antibx course,  to f/u any worsening symptoms or concerns 

## 2017-09-12 NOTE — Patient Instructions (Signed)
Please take all new medication as prescribed - the ceftin as directed  Please continue all other medications as before  Please have the pharmacy call with any other refills you may need.  Please continue your efforts at being more active, low cholesterol diet, and weight control  Please keep your appointments with your specialists as you may have planned  Please go to the LAB in the Basement (turn left off the elevator) for the tests to be done today  You will be contacted by phone if any changes need to be made immediately.  Otherwise, you will receive a letter about your results with an explanation, but please check with MyChart first.  Please remember to sign up for MyChart if you have not done so, as this will be important to you in the future with finding out test results, communicating by private email, and scheduling acute appointments online when needed.

## 2017-09-13 ENCOUNTER — Telehealth: Payer: Self-pay

## 2017-09-13 NOTE — Telephone Encounter (Signed)
-----   Message from Biagio Borg, MD sent at 09/12/2017  5:16 PM EDT ----- Left message on MyChart, pt to cont same tx except  The test results show that your current treatment is OK, except the INR is moderately too high, and will lead to more bleeding if not improved.  Please HOLD all coumadin for 2 days (today and tomorrow), then restart at 1 of the 7.5 mg pills daily except for Wed and Friday at 1.5 pills (one and 1/2 of the 7.5 mg pills if they are able to be cut in half)  We should have you follow up with [name] and remember to keep your follow up appts with [name] as this can lead to bleeding even worse the recent vaginal bleeding.    [name] to please inform pt, I will ask cindy to f/u in 2 weeks   - [name] can we have pt f/u with you in 2 weeks?

## 2017-09-13 NOTE — Progress Notes (Signed)
Noted.  I will f/u with patient.  Thanks!

## 2017-09-13 NOTE — Telephone Encounter (Signed)
Pt has viewed results via MyChart  

## 2017-09-17 ENCOUNTER — Ambulatory Visit (INDEPENDENT_AMBULATORY_CARE_PROVIDER_SITE_OTHER): Payer: Self-pay | Admitting: General Practice

## 2017-09-17 NOTE — Patient Instructions (Addendum)
Pre visit review using our clinic review tool, if applicable. No additional management support is needed unless otherwise documented below in the visit note.  Patient saw Dr. [name] 9/11.  Patient had labs and INR was high.  Dr. [name] instructed pt to hold dosage for 2 days and then change dosage and take 7.25 daily except 1 1/2 tablet of (11.25 mg) on Monday and Fridays.   Re-check in 2 weeks.      No charge

## 2017-09-17 NOTE — Progress Notes (Signed)
Noted. I left patient a message to call and schedule INR appointment the later part of this week of next week.

## 2017-09-26 ENCOUNTER — Telehealth: Payer: Self-pay | Admitting: Neurology

## 2017-09-26 MED ORDER — ZONISAMIDE 100 MG PO CAPS: 100.0000 mg | ORAL_CAPSULE | Freq: Every day | ORAL | 2 refills | Status: DC

## 2017-09-26 NOTE — Addendum Note (Signed)
Addended by: France Ravens I on: 09/26/2017 05:04 PM   Modules accepted: Orders

## 2017-09-26 NOTE — Telephone Encounter (Signed)
Spoke with [name].  She sts. still unable to tolerate Diamox, even starting at a lower dose and titrating up. Per RAS, ok to stop Diamox and start Zonisamide 100mg  QHS. Pt. is agreeable. We discussed that the sz. medications that treat migraines do sometimes come with tolerability issues that often lessen or resolve over time. She verbalized understanding of same. Rx. escribed to [facility] per her request/fim

## 2017-09-26 NOTE — Telephone Encounter (Signed)
Patient states for the past month she has had daily migraines. She is taking acetaZOLAMIDE (DIAMOX) 250 MG tablet but it causes her feet and legs to feel like pins and needles are in them.

## 2017-09-27 ENCOUNTER — Other Ambulatory Visit: Payer: Self-pay | Admitting: Internal Medicine

## 2017-09-27 DIAGNOSIS — K580 Irritable bowel syndrome with diarrhea: Secondary | ICD-10-CM

## 2017-09-27 NOTE — Telephone Encounter (Signed)
Done erx 

## 2017-09-28 NOTE — Telephone Encounter (Signed)
MD approved and sent electronically to pof../lmb  

## 2017-10-01 ENCOUNTER — Encounter: Payer: Self-pay | Admitting: Internal Medicine

## 2017-10-01 MED ORDER — CEFUROXIME AXETIL 250 MG PO TABS: 250.0000 mg | ORAL_TABLET | Freq: Two times a day (BID) | ORAL | 0 refills | Status: AC

## 2017-10-03 ENCOUNTER — Other Ambulatory Visit: Payer: Self-pay | Admitting: Internal Medicine

## 2017-10-04 ENCOUNTER — Other Ambulatory Visit: Payer: Self-pay | Admitting: Internal Medicine

## 2017-10-09 ENCOUNTER — Other Ambulatory Visit: Payer: Self-pay | Admitting: Neurology

## 2017-10-09 MED ORDER — ZONISAMIDE 50 MG PO CAPS: 150.0000 mg | ORAL_CAPSULE | Freq: Every day | ORAL | 3 refills | Status: DC

## 2017-10-16 ENCOUNTER — Ambulatory Visit: Payer: BLUE CROSS/BLUE SHIELD | Admitting: Internal Medicine

## 2017-10-18 ENCOUNTER — Ambulatory Visit: Payer: BLUE CROSS/BLUE SHIELD

## 2017-10-22 DIAGNOSIS — J209 Acute bronchitis, unspecified: Secondary | ICD-10-CM | POA: Diagnosis not present

## 2017-10-22 DIAGNOSIS — R05 Cough: Secondary | ICD-10-CM | POA: Diagnosis not present

## 2017-10-23 ENCOUNTER — Other Ambulatory Visit: Payer: Self-pay | Admitting: Internal Medicine

## 2017-10-23 DIAGNOSIS — K580 Irritable bowel syndrome with diarrhea: Secondary | ICD-10-CM

## 2017-10-24 NOTE — Telephone Encounter (Signed)
   LOV:09/12/17 NextOV:not scheduled Last Filled/Quantity: 09/27/17 120#

## 2017-10-26 DIAGNOSIS — R05 Cough: Secondary | ICD-10-CM | POA: Diagnosis not present

## 2017-10-30 ENCOUNTER — Encounter: Payer: BLUE CROSS/BLUE SHIELD | Admitting: Gynecology

## 2017-10-30 DIAGNOSIS — Z0289 Encounter for other administrative examinations: Secondary | ICD-10-CM

## 2017-11-21 ENCOUNTER — Other Ambulatory Visit: Payer: Self-pay | Admitting: Family

## 2017-11-21 DIAGNOSIS — K580 Irritable bowel syndrome with diarrhea: Secondary | ICD-10-CM

## 2017-11-21 NOTE — Telephone Encounter (Signed)
Done erx 

## 2017-11-23 ENCOUNTER — Ambulatory Visit: Payer: BLUE CROSS/BLUE SHIELD | Admitting: General Practice

## 2017-11-23 ENCOUNTER — Ambulatory Visit (INDEPENDENT_AMBULATORY_CARE_PROVIDER_SITE_OTHER): Payer: BLUE CROSS/BLUE SHIELD | Admitting: Internal Medicine

## 2017-11-23 ENCOUNTER — Encounter: Payer: Self-pay | Admitting: Internal Medicine

## 2017-11-23 ENCOUNTER — Other Ambulatory Visit: Payer: Self-pay | Admitting: Internal Medicine

## 2017-11-23 VITALS — BP 126/88 | HR 69 | Temp 97.6°F | Ht 68.0 in

## 2017-11-23 DIAGNOSIS — H6692 Otitis media, unspecified, left ear: Secondary | ICD-10-CM

## 2017-11-23 DIAGNOSIS — H6983 Other specified disorders of Eustachian tube, bilateral: Secondary | ICD-10-CM | POA: Diagnosis not present

## 2017-11-23 DIAGNOSIS — J309 Allergic rhinitis, unspecified: Secondary | ICD-10-CM

## 2017-11-23 DIAGNOSIS — Z5181 Encounter for therapeutic drug level monitoring: Secondary | ICD-10-CM

## 2017-11-23 LAB — POCT INR: INR: 2.2 (ref 2.0–3.0)

## 2017-11-23 MED ORDER — TRIAMCINOLONE ACETONIDE 55 MCG/ACT NA AERO: 2.0000 | INHALATION_SPRAY | Freq: Every day | NASAL | 12 refills | Status: DC

## 2017-11-23 MED ORDER — CETIRIZINE HCL 10 MG PO TABS: 10.0000 mg | ORAL_TABLET | Freq: Every day | ORAL | 11 refills | Status: DC

## 2017-11-23 MED ORDER — CEFUROXIME AXETIL 250 MG PO TABS: 250.0000 mg | ORAL_TABLET | Freq: Two times a day (BID) | ORAL | 0 refills | Status: AC

## 2017-11-23 NOTE — Patient Instructions (Signed)
Please take all new medication as prescribed - the ceftin  Please take all new medication as prescribed - the zyrtec, nasacort and mucinex to help the sinusus  Please continue all other medications as before, and refills have been done if requested.  Please have the pharmacy call with any other refills you may need.  Please continue your efforts at being more active, low cholesterol diet, and weight control  Please keep your appointments with your specialists as you may have planned  Please see [name] in the office today for the INR

## 2017-11-23 NOTE — Patient Instructions (Incomplete)
Pre visit review using our clinic review tool, if applicable. No additional management support is needed unless otherwise documented below in the visit note.  Take 2 tablets today (11/22) and then continue to take 1 tablet on Sun/Tues/thursday, 2 tablets on Wed. And 1 1/2 tablets on Mon/Friday and Sat.  Re-check in 2 weeks.

## 2017-11-23 NOTE — Progress Notes (Signed)
Subjective:    Patient ID: [name], female    DOB: Jul 25, 1955, 62 y.o.   MRN: [id number]  HPI   Here with 2-3 days acute onset fever, left ear pain, pressure, headache, general weakness and malaise, and with mild ST and cough, but pt denies chest pain, wheezing, increased sob or doe, orthopnea, PND, increased LE swelling, palpitations, dizziness or syncope.  Right ear pain not as severe as usual in the past.  Does have several wks ongoing nasal allergy symptoms with clearish congestion, itch and sneezing, without fever, pain, ST, cough, swelling or wheezing, as well as bilat ear popping and crackling worse with chewing. Past Medical History:  Diagnosis Date  . Abdominal pain, epigastric 10/25/2007  . ADD 01/20/2009  . ALLERGIC RHINITIS 10/15/2006  . ANGIOEDEMA 08/12/2008  . ANXIETY 10/15/2006  . ASTHMATIC BRONCHITIS, ACUTE 01/27/2008  . Cellulitis and abscess of leg, except foot 08/12/2007  . CELLULITIS, [place] 12/21/2008  . CHEST PAIN 06/14/2007  . Chronic anticoagulation 05/16/2016  . Chronic sinus infection 04/12/2010  . COLECTOMY, PARTIAL, WITH ANASTOMOSIS, HX OF 10/15/2006  . COLONIC POLYPS, HX OF 10/15/2006  . CYST, OVARIAN NEC/NOS 10/15/2006  . Dengue 02/22/2010  . DVT, HX OF 10/15/2006  . DYSAUTONOMIA 10/15/2006  . Dysfunction of eustachian tube 05/26/2009  . GI BLEEDING 04/04/2007  . Glossitis 03/06/2008  . HEADACHE, CHRONIC 04/04/2007  . Hematemesis 10/25/2007  . HEMORRHOIDS 04/04/2007  . HYPERLIPIDEMIA 12/12/2007  . HYPERTENSION 10/15/2006  . HYPOTHYROIDISM 03/06/2008  . IBS 04/04/2007  . MASTECTOMY, BILATERAL, HX OF 08/12/2009  . NECK MASS 07/01/2007  . NEOP, MALIGNANT, FEMALE BREAST NOS 10/15/2006  . 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 SYNCOPE 07/01/2007  . Wheezing 08/20/2008  . WOUND, OPEN, LEG, WITHOUT COMPLICATION 9/62/9528   Past Surgical History:  Procedure Laterality Date  . ABDOMINAL HYSTERECTOMY    . bilateral mastectomy/reconstruction    . CESAREAN SECTION    .  CHOLECYSTECTOMY    . COLONOSCOPY W/ BIOPSIES    . FLEXIBLE SIGMOIDOSCOPY    . LEFT AND RIGHT HEART CATHETERIZATION WITH CORONARY ANGIOGRAM N/A 10/07/2012   Procedure: LEFT AND RIGHT HEART CATHETERIZATION WITH CORONARY ANGIOGRAM;  Surgeon: Jolaine Artist, MD;  Location: [facility] CATH LAB;  Service: Cardiovascular;  Laterality: N/A;  . multiple ablations    . OOPHORECTOMY    . open heart surg    . spigellian hernia      reports that she has never smoked. She has never used smokeless tobacco. She reports that she does not drink alcohol or use drugs. family history includes Cancer in her sister; Heart disease in her mother; Pancreatic cancer in her father. Allergies  Allergen Reactions  . Fish Allergy Swelling  . Latex Dermatitis    More than a day causes blisters   . Levaquin [Levofloxacin In D5w]     n/v  . Penicillins     REACTION: shock   Current Outpatient Medications on File Prior to Visit  Medication Sig Dispense Refill  . acetaminophen-codeine (TYLENOL #3) 300-30 MG tablet TAKE 1 TABLET BY MOUTH EVERY 6 HOURS AS NEEDED FOR BRIDGE FOR 11 DAYS 120 tablet 0  . fluticasone (FLONASE) 50 MCG/ACT nasal spray SHAKE LIQUID AND USE 2 SPRAYS IN EACH NOSTRIL DAILY 16 g 0  . levothyroxine (SYNTHROID, LEVOTHROID) 50 MCG tablet TAKE ONE TABLET EVERY DAY BEFORE BEFORE 90 tablet 0  . losartan (COZAAR) 25 MG tablet Take 25 mg by mouth daily.    . metoprolol succinate (TOPROL-XL) 50 MG  24 hr tablet Take 1 tablet (50 mg total) by mouth daily. Take 1 by mouth daily 90 tablet 1  . metroNIDAZOLE (FLAGYL) 250 MG tablet Take 1 tablet (250 mg total) by mouth 3 (three) times daily. 30 tablet 0  . venlafaxine XR (EFFEXOR-XR) 150 MG 24 hr capsule TAKE ONE CAPSULE DAILY 90 capsule 3  . warfarin (COUMADIN) 7.5 MG tablet TAKE 1 TABLET BY MOUTH DAILY EXCEPT TAKE 2 TABLETS ON WEDNESDAY AND FRIDAY OR AS DIRECTED BY ANTICOAGULATION CLINIC  No further re-fills until patient has INR appt 20 tablet 0  . zolpidem (AMBIEN)  10 MG tablet TAKE 1 TABLET BY MOUTH EVERY DAY AT BEDTIME AS NEEDED 90 tablet 1  . zonisamide (ZONEGRAN) 50 MG capsule Take 3 capsules (150 mg total) by mouth at bedtime. 90 capsule 3   No current facility-administered medications on file prior to visit.    Review of Systems  Constitutional: Negative for other unusual diaphoresis or sweats HENT: Negative for ear discharge or swelling Eyes: Negative for other worsening visual disturbances Respiratory: Negative for stridor or other swelling  Gastrointestinal: Negative for worsening distension or other blood Genitourinary: Negative for retention or other urinary change Musculoskeletal: Negative for other MSK pain or swelling Skin: Negative for color change or other new lesions Neurological: Negative for worsening tremors and other numbness  Psychiatric/Behavioral: Negative for worsening agitation or other fatigue All other system neg per pt    Objective:   Physical Exam BP 126/88   Pulse 69   Temp 97.6 F (36.4 C) (Oral)   Ht 5\' 8"  (1.727 m)   SpO2 94%   BMI 32.77 kg/m  VS noted, mild ill Constitutional: Pt appears in NAD HENT: Head: NCAT.  Right Ear: External ear normal.  Left Ear: External ear normal.  Bilat tm's with mild to mod erythema left > right.  Max sinus areas non  tender.  Pharynx with mild erythema, no exudate Eyes: . Pupils are equal, round, and reactive to light. Conjunctivae and EOM are normal Nose: without d/c or deformity Neck: Neck supple. Gross normal ROM Cardiovascular: Normal rate and regular rhythm.   Pulmonary/Chest: Effort normal and breath sounds without rales or wheezing.  Neurological: Pt is alert. At baseline orientation, motor grossly intact Skin: Skin is warm. No rashes, other new lesions, no LE edema Psychiatric: Pt behavior is normal without agitation  No other exam findings Lab Results  Component Value Date   WBC 7.5 09/12/2017   HGB 13.0 09/12/2017   HCT 38.4 09/12/2017   PLT 333.0  09/12/2017   GLUCOSE 101 (H) 09/12/2017   CHOL 209 (H) 12/13/2011   TRIG 195.0 (H) 12/13/2011   HDL 45.10 12/13/2011   LDLDIRECT 137.4 12/13/2011   LDLCALC 73 02/11/2008   ALT 17 08/14/2017   AST 18 08/14/2017   NA 134 (L) 09/12/2017   K 3.9 09/12/2017   CL 105 09/12/2017   CREATININE 1.03 09/12/2017   BUN 17 09/12/2017   CO2 22 09/12/2017   TSH 3.04 10/02/2012   INR 2.2 11/23/2017   HGBA1C  08/12/2007    5.2 (NOTE)   The ADA recommends the following therapeutic goal for glycemic   control related to Hgb A1C measurement:   Goal of Therapy:   < 7.0% Hgb A1C   Reference: American Diabetes Association: Clinical Practice   Recommendations 2008, Diabetes Care,  2008, 31:(Suppl 1).       Assessment & Plan:

## 2017-11-24 DIAGNOSIS — H6983 Other specified disorders of Eustachian tube, bilateral: Secondary | ICD-10-CM | POA: Insufficient documentation

## 2017-11-24 NOTE — Assessment & Plan Note (Signed)
Mild to mod, for antibx course,  to f/u any worsening symptoms or concerns 

## 2017-11-24 NOTE — Assessment & Plan Note (Signed)
Also for mucinex bid prn,  to f/u any worsening symptoms or concerns 

## 2017-11-24 NOTE — Assessment & Plan Note (Signed)
Mild to mod, for zyrtec and nasacort asd,  to f/u any worsening symptoms or concerns 

## 2017-11-26 ENCOUNTER — Other Ambulatory Visit: Payer: Self-pay | Admitting: General Practice

## 2017-11-27 ENCOUNTER — Ambulatory Visit: Payer: BLUE CROSS/BLUE SHIELD | Admitting: Internal Medicine

## 2017-12-07 ENCOUNTER — Ambulatory Visit: Payer: BLUE CROSS/BLUE SHIELD

## 2017-12-18 ENCOUNTER — Encounter: Payer: Self-pay | Admitting: Internal Medicine

## 2017-12-18 MED ORDER — CEFUROXIME AXETIL 250 MG PO TABS: 250.0000 mg | ORAL_TABLET | Freq: Two times a day (BID) | ORAL | 0 refills | Status: AC

## 2017-12-20 ENCOUNTER — Other Ambulatory Visit: Payer: Self-pay | Admitting: Internal Medicine

## 2017-12-20 DIAGNOSIS — K580 Irritable bowel syndrome with diarrhea: Secondary | ICD-10-CM

## 2017-12-20 NOTE — Telephone Encounter (Signed)
Done erx 

## 2017-12-23 ENCOUNTER — Other Ambulatory Visit: Payer: Self-pay | Admitting: Internal Medicine

## 2018-01-09 ENCOUNTER — Encounter: Payer: Self-pay | Admitting: Internal Medicine

## 2018-01-09 ENCOUNTER — Ambulatory Visit (INDEPENDENT_AMBULATORY_CARE_PROVIDER_SITE_OTHER): Payer: BLUE CROSS/BLUE SHIELD | Admitting: Internal Medicine

## 2018-01-09 VITALS — BP 144/92 | HR 78 | Temp 98.0°F

## 2018-01-09 DIAGNOSIS — F411 Generalized anxiety disorder: Secondary | ICD-10-CM

## 2018-01-09 DIAGNOSIS — J019 Acute sinusitis, unspecified: Secondary | ICD-10-CM | POA: Diagnosis not present

## 2018-01-09 DIAGNOSIS — I10 Essential (primary) hypertension: Secondary | ICD-10-CM | POA: Diagnosis not present

## 2018-01-09 MED ORDER — DOXYCYCLINE HYCLATE 100 MG PO TABS: 100.0000 mg | ORAL_TABLET | Freq: Two times a day (BID) | ORAL | 0 refills | Status: DC

## 2018-01-09 NOTE — Progress Notes (Signed)
Subjective:    Patient ID: [name], female    DOB: 1955-08-24, 63 y.o.   MRN: [id number]  HPI   Here with 2-3 days acute onset fever, left ear pain, facial pain, pressure, headache, general weakness and malaise, and greenish d/c, with mild ST and cough, but pt denies chest pain, wheezing, increased sob or doe, orthopnea, PND, increased LE swelling, palpitations, dizziness or syncope. Pt denies new neurological symptoms such as new headache, or facial or extremity weakness or numbness   Pt denies polydipsia, polyuria, Denies worsening depressive symptoms, suicidal ideation, or panic; has ongoing anxiety Past Medical History:  Diagnosis Date  . Abdominal pain, epigastric 10/25/2007  . ADD 01/20/2009  . ALLERGIC RHINITIS 10/15/2006  . ANGIOEDEMA 08/12/2008  . ANXIETY 10/15/2006  . ASTHMATIC BRONCHITIS, ACUTE 01/27/2008  . Cellulitis and abscess of leg, except foot 08/12/2007  . CELLULITIS, [place] 12/21/2008  . CHEST PAIN 06/14/2007  . Chronic anticoagulation 05/16/2016  . Chronic sinus infection 04/12/2010  . COLECTOMY, PARTIAL, WITH ANASTOMOSIS, HX OF 10/15/2006  . COLONIC POLYPS, HX OF 10/15/2006  . CYST, OVARIAN NEC/NOS 10/15/2006  . Dengue 02/22/2010  . DVT, HX OF 10/15/2006  . DYSAUTONOMIA 10/15/2006  . Dysfunction of eustachian tube 05/26/2009  . GI BLEEDING 04/04/2007  . Glossitis 03/06/2008  . HEADACHE, CHRONIC 04/04/2007  . Hematemesis 10/25/2007  . HEMORRHOIDS 04/04/2007  . HYPERLIPIDEMIA 12/12/2007  . HYPERTENSION 10/15/2006  . HYPOTHYROIDISM 03/06/2008  . IBS 04/04/2007  . MASTECTOMY, BILATERAL, HX OF 08/12/2009  . NECK MASS 07/01/2007  . NEOP, MALIGNANT, FEMALE BREAST NOS 10/15/2006  . 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 SYNCOPE 07/01/2007  . Wheezing 08/20/2008  . WOUND, OPEN, LEG, WITHOUT COMPLICATION 1/96/2229   Past Surgical History:  Procedure Laterality Date  . ABDOMINAL HYSTERECTOMY    . bilateral mastectomy/reconstruction    . CESAREAN SECTION    . CHOLECYSTECTOMY      . COLONOSCOPY W/ BIOPSIES    . FLEXIBLE SIGMOIDOSCOPY    . LEFT AND RIGHT HEART CATHETERIZATION WITH CORONARY ANGIOGRAM N/A 10/07/2012   Procedure: LEFT AND RIGHT HEART CATHETERIZATION WITH CORONARY ANGIOGRAM;  Surgeon: Jolaine Artist, MD;  Location: [facility] CATH LAB;  Service: Cardiovascular;  Laterality: N/A;  . multiple ablations    . OOPHORECTOMY    . open heart surg    . spigellian hernia      reports that she has never smoked. She has never used smokeless tobacco. She reports that she does not drink alcohol or use drugs. family history includes Cancer in her sister; Heart disease in her mother; Pancreatic cancer in her father. Allergies  Allergen Reactions  . Fish Allergy Swelling  . Latex Dermatitis    More than a day causes blisters   . Levaquin [Levofloxacin In D5w]     n/v  . Penicillins     REACTION: shock   Current Outpatient Medications on File Prior to Visit  Medication Sig Dispense Refill  . acetaminophen-codeine (TYLENOL #3) 300-30 MG tablet TAKE 1 TABLET BY MOUTH EVERY 6 HOURS AS NEEDED FOR BRIDGE FOR 11 DAYS 120 tablet 0  . cetirizine (ZYRTEC) 10 MG tablet Take 1 tablet (10 mg total) by mouth daily. 30 tablet 11  . fluticasone (FLONASE) 50 MCG/ACT nasal spray SHAKE LIQUID AND USE 2 SPRAYS IN EACH NOSTRIL DAILY 16 g 0  . levothyroxine (SYNTHROID, LEVOTHROID) 50 MCG tablet TAKE ONE TABLET EVERY DAY BEFORE BEFORE 90 tablet 0  . losartan (COZAAR) 25 MG tablet Take 25 mg by  mouth daily.    . metoprolol succinate (TOPROL-XL) 50 MG 24 hr tablet Take 1 tablet (50 mg total) by mouth daily. Take 1 by mouth daily 90 tablet 1  . metroNIDAZOLE (FLAGYL) 250 MG tablet Take 1 tablet (250 mg total) by mouth 3 (three) times daily. 30 tablet 0  . triamcinolone (NASACORT) 55 MCG/ACT AERO nasal inhaler Place 2 sprays into the nose daily. 1 Inhaler 12  . venlafaxine XR (EFFEXOR-XR) 150 MG 24 hr capsule TAKE ONE CAPSULE DAILY 90 capsule 3  . warfarin (COUMADIN) 7.5 MG tablet TAKE 1 TABLET  BY MOUTH ON SUNDAY, TUESDAY, THURSDAY, 2 TABLETS ON WEDNESDAY, AND 1 AND 1/2 TABLETS ON MONDAY, FRIDAY AND SATURDAY OR AS  30 day supply.  Pt must have INR before further refills will be given 40 tablet 0  . zolpidem (AMBIEN) 10 MG tablet TAKE 1 TABLET BY MOUTH EVERY DAY AT BEDTIME AS NEEDED 90 tablet 1  . zonisamide (ZONEGRAN) 50 MG capsule Take 3 capsules (150 mg total) by mouth at bedtime. 90 capsule 3   No current facility-administered medications on file prior to visit.    Review of Systems  Constitutional: Negative for other unusual diaphoresis or sweats HENT: Negative for ear discharge or swelling Eyes: Negative for other worsening visual disturbances Respiratory: Negative for stridor or other swelling  Gastrointestinal: Negative for worsening distension or other blood Genitourinary: Negative for retention or other urinary change Musculoskeletal: Negative for other MSK pain or swelling Skin: Negative for color change or other new lesions Neurological: Negative for worsening tremors and other numbness  Psychiatric/Behavioral: Negative for worsening agitation or other fatigue All other system neg per pt    Objective:   Physical Exam BP (!) 144/92   Pulse 78   Temp 98 F (36.7 C) (Oral)   SpO2 95%  VS noted,  Constitutional: Pt appears in NAD HENT: Head: NCAT.  Right Ear: External ear normal.  Left Ear: External ear normal.  Eyes: . Pupils are equal, round, and reactive to light. Conjunctivae and EOM are normal Nose: without d/c or deformity Neck: Neck supple. Gross normal ROM Cardiovascular: Normal rate and regular rhythm.   Pulmonary/Chest: Effort normal and breath sounds without rales or wheezing.  Abd:  Soft, NT, ND, + BS, no organomegaly Neurological: Pt is alert. At baseline orientation, motor grossly intact Skin: Skin is warm. No rashes, other new lesions, no LE edema Psychiatric: Pt behavior is normal without agitation  No other exam findings Lab Results    Component Value Date   WBC 7.5 09/12/2017   HGB 13.0 09/12/2017   HCT 38.4 09/12/2017   PLT 333.0 09/12/2017   GLUCOSE 101 (H) 09/12/2017   CHOL 209 (H) 12/13/2011   TRIG 195.0 (H) 12/13/2011   HDL 45.10 12/13/2011   LDLDIRECT 137.4 12/13/2011   LDLCALC 73 02/11/2008   ALT 17 08/14/2017   AST 18 08/14/2017   NA 134 (L) 09/12/2017   K 3.9 09/12/2017   CL 105 09/12/2017   CREATININE 1.03 09/12/2017   BUN 17 09/12/2017   CO2 22 09/12/2017   TSH 3.04 10/02/2012   INR 2.2 11/23/2017   HGBA1C  08/12/2007    5.2 (NOTE)   The ADA recommends the following therapeutic goal for glycemic   control related to Hgb A1C measurement:   Goal of Therapy:   < 7.0% Hgb A1C   Reference: American Diabetes Association: Clinical Practice   Recommendations 2008, Diabetes Care,  2008, 31:(Suppl 1).  Assessment & Plan:

## 2018-01-09 NOTE — Assessment & Plan Note (Signed)
stable overall by history and exam, recent data reviewed with pt, and pt to continue medical treatment as before,  to f/u any worsening symptoms or concerns  

## 2018-01-09 NOTE — Patient Instructions (Signed)
Please take all new medication as prescribed - the antibiotic  Please continue all other medications as before, and refills have been done if requested.  Please have the pharmacy call with any other refills you may need.  Please keep your appointments with your specialists as you may have planned   

## 2018-01-09 NOTE — Assessment & Plan Note (Signed)
Mild elevated, likely reactive, to cont same tx,  to f/u any worsening symptoms or concerns

## 2018-01-09 NOTE — Assessment & Plan Note (Signed)
Mild to mod, for antibx course,  to f/u any worsening symptoms or concerns 

## 2018-01-18 ENCOUNTER — Encounter: Payer: Self-pay | Admitting: Internal Medicine

## 2018-01-18 ENCOUNTER — Telehealth: Payer: Self-pay | Admitting: Internal Medicine

## 2018-01-18 DIAGNOSIS — K580 Irritable bowel syndrome with diarrhea: Secondary | ICD-10-CM

## 2018-01-18 MED ORDER — CEFUROXIME AXETIL 250 MG PO TABS: 250.0000 mg | ORAL_TABLET | Freq: Two times a day (BID) | ORAL | 0 refills | Status: AC

## 2018-01-18 MED ORDER — ACETAMINOPHEN-CODEINE #3 300-30 MG PO TABS: ORAL_TABLET | ORAL | 0 refills | Status: DC

## 2018-01-18 NOTE — Telephone Encounter (Signed)
Done erx 

## 2018-01-28 ENCOUNTER — Other Ambulatory Visit: Payer: Self-pay | Admitting: Neurology

## 2018-02-07 ENCOUNTER — Ambulatory Visit: Payer: BLUE CROSS/BLUE SHIELD | Admitting: Neurology

## 2018-02-07 ENCOUNTER — Telehealth: Payer: Self-pay | Admitting: *Deleted

## 2018-02-07 NOTE — Telephone Encounter (Signed)
Called, LVM for pt (ok per DPR) letting her know appt cx this afternoon d/t weather. Office will be closing early. Asked her to call back at her convenience to get her appt r/s. Gave [facility] phone number. I took her off the schedule.   I also sent her a Pharmacist, community message.

## 2018-02-20 ENCOUNTER — Telehealth: Payer: Self-pay | Admitting: Internal Medicine

## 2018-02-20 DIAGNOSIS — K580 Irritable bowel syndrome with diarrhea: Secondary | ICD-10-CM

## 2018-02-20 MED ORDER — ACETAMINOPHEN-CODEINE #3 300-30 MG PO TABS: ORAL_TABLET | ORAL | 0 refills | Status: DC

## 2018-02-20 NOTE — Telephone Encounter (Signed)
Medication not delegated to NT to refill. Routing to [employer]

## 2018-02-20 NOTE — Telephone Encounter (Signed)
Done erx 

## 2018-02-20 NOTE — Telephone Encounter (Signed)
Copied from [place] [id number]. Topic: Quick Communication - Rx Refill/Question >> Feb 20, 2018  1:46 PM [name] wrote: Medication: acetaminophen-codeine (TYLENOL #3) 300-30 MG tablet  Has the patient contacted their pharmacy? Yes - states that they have been trying to get this refilled since the 16th and haven't heard anything from our office. (Agent: If no, request that the patient contact the pharmacy for the refill.) (Agent: If yes, when and what did the pharmacy advise?)  Preferred Pharmacy (with phone number or street name): [facility] [place], [place] ([place] [phone] (Phone) [phone] (Fax)  Agent: Please be advised that RX refills may take up to 3 business days. We ask that you follow-up with your pharmacy.

## 2018-02-20 NOTE — Addendum Note (Signed)
Addended by: Biagio Borg on: 02/20/2018 02:18 PM   Modules accepted: Orders

## 2018-02-26 ENCOUNTER — Ambulatory Visit (INDEPENDENT_AMBULATORY_CARE_PROVIDER_SITE_OTHER): Payer: BLUE CROSS/BLUE SHIELD | Admitting: Internal Medicine

## 2018-02-26 ENCOUNTER — Encounter: Payer: Self-pay | Admitting: Internal Medicine

## 2018-02-26 VITALS — BP 122/82 | HR 67 | Temp 97.6°F | Ht 68.0 in

## 2018-02-26 DIAGNOSIS — H518 Other specified disorders of binocular movement: Secondary | ICD-10-CM | POA: Diagnosis not present

## 2018-02-26 DIAGNOSIS — J069 Acute upper respiratory infection, unspecified: Secondary | ICD-10-CM

## 2018-02-26 DIAGNOSIS — H6983 Other specified disorders of Eustachian tube, bilateral: Secondary | ICD-10-CM

## 2018-02-26 MED ORDER — CEFUROXIME AXETIL 250 MG PO TABS: 250.0000 mg | ORAL_TABLET | Freq: Two times a day (BID) | ORAL | 1 refills | Status: AC

## 2018-02-26 NOTE — Assessment & Plan Note (Signed)
Mild to mod, for antibx course,  to f/u any worsening symptoms or concerns 

## 2018-02-26 NOTE — Patient Instructions (Signed)
Please take all new medication as prescribed - the antibiotic  You can also take Delsym OTC for cough, and/or Mucinex (or it's generic off brand) for congestion, and tylenol as needed for pain.  Please work on getting the cats back to your daughter asap  Please continue all other medications as before, and refills have been done if requested.  Please have the pharmacy call with any other refills you may need.  Please keep your appointments with your specialists as you may have planned

## 2018-02-26 NOTE — Assessment & Plan Note (Signed)
Pt to call for higher dose zonegran per neurology,  to f/u any worsening symptoms or concerns

## 2018-02-26 NOTE — Progress Notes (Signed)
Subjective:    Patient ID: [name], female    DOB: 1955-01-26, 63 y.o.   MRN: [id number]  HPI   Here with 2-3 days acute onset fever, severe bilat ear pain, pressure, headache, general weakness and malaise with mild ST and nonprod cough, but pt denies chest pain, wheezing, increased sob or doe, orthopnea, PND, increased LE swelling, palpitations, dizziness or syncope. Also Does have several wks ongoing nasal allergy symptoms with clearish congestion, itch and sneezing, without fever, pain, ST, cough, swelling or wheezing, and is keeping 4 cats for her daughter who cant take them until May 2020. Flying to [place].   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 Past Medical History:  Diagnosis Date  . Abdominal pain, epigastric 10/25/2007  . ADD 01/20/2009  . ALLERGIC RHINITIS 10/15/2006  . ANGIOEDEMA 08/12/2008  . ANXIETY 10/15/2006  . ASTHMATIC BRONCHITIS, ACUTE 01/27/2008  . Cellulitis and abscess of leg, except foot 08/12/2007  . CELLULITIS, [place] 12/21/2008  . CHEST PAIN 06/14/2007  . Chronic anticoagulation 05/16/2016  . Chronic sinus infection 04/12/2010  . COLECTOMY, PARTIAL, WITH ANASTOMOSIS, HX OF 10/15/2006  . COLONIC POLYPS, HX OF 10/15/2006  . CYST, OVARIAN NEC/NOS 10/15/2006  . Dengue 02/22/2010  . DVT, HX OF 10/15/2006  . DYSAUTONOMIA 10/15/2006  . Dysfunction of eustachian tube 05/26/2009  . GI BLEEDING 04/04/2007  . Glossitis 03/06/2008  . HEADACHE, CHRONIC 04/04/2007  . Hematemesis 10/25/2007  . HEMORRHOIDS 04/04/2007  . HYPERLIPIDEMIA 12/12/2007  . HYPERTENSION 10/15/2006  . HYPOTHYROIDISM 03/06/2008  . IBS 04/04/2007  . MASTECTOMY, BILATERAL, HX OF 08/12/2009  . NECK MASS 07/01/2007  . NEOP, MALIGNANT, FEMALE BREAST NOS 10/15/2006  . 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 SYNCOPE 07/01/2007  . Wheezing 08/20/2008  . WOUND, OPEN, LEG, WITHOUT  COMPLICATION 02/15/863   Past Surgical History:  Procedure Laterality Date  . ABDOMINAL HYSTERECTOMY    . bilateral mastectomy/reconstruction    . CESAREAN SECTION    . CHOLECYSTECTOMY    . COLONOSCOPY W/ BIOPSIES    . FLEXIBLE SIGMOIDOSCOPY    . LEFT AND RIGHT HEART CATHETERIZATION WITH CORONARY ANGIOGRAM N/A 10/07/2012   Procedure: LEFT AND RIGHT HEART CATHETERIZATION WITH CORONARY ANGIOGRAM;  Surgeon: Jolaine Artist, MD;  Location: [facility] CATH LAB;  Service: Cardiovascular;  Laterality: N/A;  . multiple ablations    . OOPHORECTOMY    . open heart surg    . spigellian hernia      reports that she has never smoked. She has never used smokeless tobacco. She reports that she does not drink alcohol or use drugs. family history includes Cancer in her sister; Heart disease in her mother; Pancreatic cancer in her father. Allergies  Allergen Reactions  . Fish Allergy Swelling  . Latex Dermatitis    More than a day causes blisters   . Levaquin [Levofloxacin In D5w]     n/v  . Penicillins     REACTION: shock   Current Outpatient Medications on File Prior to Visit  Medication Sig Dispense Refill  . acetaminophen-codeine (TYLENOL #3) 300-30 MG tablet TAKE 1 TABLET BY MOUTH EVERY 6 HOURS AS NEEDED FOR BRIDGE FOR 11 DAYS 120 tablet 0  . cetirizine (ZYRTEC) 10 MG tablet Take 1 tablet (10 mg total) by mouth daily. 30 tablet 11  . fluticasone (FLONASE) 50 MCG/ACT nasal spray SHAKE LIQUID AND USE  2 SPRAYS IN EACH NOSTRIL DAILY 16 g 0  . levothyroxine (SYNTHROID, LEVOTHROID) 50 MCG tablet TAKE ONE TABLET EVERY DAY BEFORE BEFORE 90 tablet 0  . losartan (COZAAR) 25 MG tablet Take 25 mg by mouth daily.    . metoprolol succinate (TOPROL-XL) 50 MG 24 hr tablet Take 1 tablet (50 mg total) by mouth daily. Take 1 by mouth daily 90 tablet 1  . metroNIDAZOLE (FLAGYL) 250 MG tablet Take 1 tablet (250 mg total) by mouth 3 (three) times daily. 30 tablet 0  . triamcinolone (NASACORT) 55 MCG/ACT AERO nasal  inhaler Place 2 sprays into the nose daily. 1 Inhaler 12  . venlafaxine XR (EFFEXOR-XR) 150 MG 24 hr capsule TAKE ONE CAPSULE DAILY 90 capsule 3  . warfarin (COUMADIN) 7.5 MG tablet TAKE 1 TABLET BY MOUTH ON SUNDAY, TUESDAY, THURSDAY, 2 TABLETS ON WEDNESDAY, AND 1 AND 1/2 TABLETS ON MONDAY, FRIDAY AND SATURDAY OR AS  30 day supply.  Pt must have INR before further refills will be given 40 tablet 0  . zolpidem (AMBIEN) 10 MG tablet TAKE 1 TABLET BY MOUTH EVERY DAY AT BEDTIME AS NEEDED 90 tablet 1  . zonisamide (ZONEGRAN) 50 MG capsule TAKE 3 CAPSULES(150 MG) BY MOUTH AT BEDTIME 90 capsule 3   No current facility-administered medications on file prior to visit.    Review of Systems  Constitutional: Negative for other unusual diaphoresis or sweats HENT: Negative for ear discharge or swelling Eyes: Negative for other worsening visual disturbances Respiratory: Negative for stridor or other swelling  Gastrointestinal: Negative for worsening distension or other blood Genitourinary: Negative for retention or other urinary change Musculoskeletal: Negative for other MSK pain or swelling Skin: Negative for color change or other new lesions Neurological: Negative for worsening tremors and other numbness  Psychiatric/Behavioral: Negative for worsening agitation or other fatigue All other system neg per pt    Objective:   Physical Exam BP 122/82   Pulse 67   Temp 97.6 F (36.4 C) (Oral)   Ht 5\' 8"  (1.727 m)   SpO2 96%   BMI 32.77 kg/m  VS noted, mild ill appearing Constitutional: Pt appears in NAD HENT: Head: NCAT.  Right Ear: External ear normal.  Left Ear: External ear normal.  Eyes: . Pupils are equal, round, and reactive to light. Conjunctivae and EOM are normal Bilat tm's with mild erythema.  Max sinus areas mild tender, and has bilat paranasal max sinus area mild tender red hue,  Pharynx with mild erythema, no exudate Nose: without d/c or deformity Neck: Neck supple. Gross normal  ROM Cardiovascular: Normal rate and regular rhythm.   Pulmonary/Chest: Effort normal and breath sounds without rales or wheezing.  Neurological: Pt is alert. At baseline orientation, motor grossly intact Skin: Skin is warm. No rashes, other new lesions, no LE edema Psychiatric: Pt behavior is normal without agitation  No other exam findings Lab Results  Component Value Date   WBC 7.5 09/12/2017   HGB 13.0 09/12/2017   HCT 38.4 09/12/2017   PLT 333.0 09/12/2017   GLUCOSE 101 (H) 09/12/2017   CHOL 209 (H) 12/13/2011   TRIG 195.0 (H) 12/13/2011   HDL 45.10 12/13/2011   LDLDIRECT 137.4 12/13/2011   LDLCALC 73 02/11/2008   ALT 17 08/14/2017   AST 18 08/14/2017   NA 134 (L) 09/12/2017   K 3.9 09/12/2017   CL 105 09/12/2017   CREATININE 1.03 09/12/2017   BUN 17 09/12/2017   CO2 22 09/12/2017   TSH 3.04 10/02/2012  INR 2.2 11/23/2017   HGBA1C  08/12/2007    5.2 (NOTE)   The ADA recommends the following therapeutic goal for glycemic   control related to Hgb A1C measurement:   Goal of Therapy:   < 7.0% Hgb A1C   Reference: American Diabetes Association: Clinical Practice   Recommendations 2008, Diabetes Care,  2008, 31:(Suppl 1).           Assessment & Plan:

## 2018-02-26 NOTE — Assessment & Plan Note (Signed)
[facility] for mucinex bid otc prn

## 2018-03-04 ENCOUNTER — Telehealth: Payer: Self-pay | Admitting: Neurology

## 2018-03-04 NOTE — Telephone Encounter (Signed)
Pt has been having episodes again and would like to know if her dosage for zonisamide (ZONEGRAN) 50 MG capsule can be increased. Please advise.

## 2018-03-05 ENCOUNTER — Ambulatory Visit (INDEPENDENT_AMBULATORY_CARE_PROVIDER_SITE_OTHER): Payer: BLUE CROSS/BLUE SHIELD | Admitting: Nurse Practitioner

## 2018-03-05 VITALS — BP 146/82 | HR 63 | Ht 68.0 in

## 2018-03-05 DIAGNOSIS — H519 Unspecified disorder of binocular movement: Secondary | ICD-10-CM

## 2018-03-05 DIAGNOSIS — G43109 Migraine with aura, not intractable, without status migrainosus: Secondary | ICD-10-CM | POA: Diagnosis not present

## 2018-03-05 MED ORDER — ZONISAMIDE 100 MG PO CAPS: 200.0000 mg | ORAL_CAPSULE | Freq: Every day | ORAL | 1 refills | Status: DC

## 2018-03-05 NOTE — Patient Instructions (Signed)
Increase Zonegran to 200mg  daily at night ( changed rx to 100mg  cap) Follow up in 3 months

## 2018-03-05 NOTE — Progress Notes (Signed)
[facility]  PATIENT: [name] DOB: Aug 01, 1955   REASON FOR VISIT: Follow-up for ocular motility HISTORY FROM: Patient    HISTORY OF PRESENT ILLNESS:UPDATE 3/3/2020CM [name], 63 year old female returns for follow-up with history of abnormal eye movements and frequent flashing in the field of vision.  She states this happens at least daily and is brief 10 to 15 seconds.  It always happens when she is either standing or seated never when lying down.  She has been on Diamox in the past and also topiramate with side effects from both.  According to Dr. Garth Bigness recent note he has felt this was ocular migraine.  She does not experience headache afterwards but is just fatigued and washed out.  She had called in earlier today wanting to be seen in Dr. Garth Bigness recommendation was to increase her  zonisamide to 200mg  at hs.  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   These were felt to be ocular migraines.  Diamox helped the episodes some but she did not tolerate it well due to GI side effects.   She is already metoprolol and it does not help.    She is still having the spells of visual scintillations daily.  This will occur in the visual field, either to the left or the right.  It lasts for about 15 minutes and then improves.   She also has the oculogyric spells 1-2 times a week    these also last about 15 minutes and are associated with vertigo.  If she is driving she needs to pull over.  The spells are not triggered by activity.   They occur while sitting or standing but not while laying down.    She does not experience headaches with these.      From 05/31/2017: She is a 63 yo woman who has had several episodes of abnormal eye movements since around August 2018.   The episode lasts 1-3 minutes and she feels she cannot  walk for 15 minutes or so after each minute due to severe vertigo. She also notes lip numbness and left arm (rarely arm and leg) numbness for a few minutes while she is dizzy.   She sometimes gets a headache.   She is experiencing them 1-2 times a week.    She does not take nausea or anti-psychotic medications.    She has been prescribed meclizine but has never taken it.    She gets a sensation like someone is pushing a finger into her upper chest above the sternum when these episodes occur.     She has near daily episodes of flashing in her vision since open heart surgery April 19, 2016.   These spells started while in the hospital.   They have been called ocular migraines.   She does not get a headache but notes some eye pain for a bout 1 hour when this occurs.      I personally reviewed the CT scan of the head and sinuses dated 09/13/2016.  The brain is normal for age.  There is no significant atrophy and no evidence of stroke or other changes.  The sinuses were normal.  The internal auditory canals appear normal.  She has a strong FH of migraines but mother and sister do not get aura.  She has a pacemaker.     She had dysautonomia in the past.      REVIEW OF SYSTEMS: Full 14 system review of systems performed and notable only for those listed, all others are neg:  Constitutional: neg  Cardiovascular: neg Ear/Nose/Throat: neg  Skin: neg Eyes: neg Respiratory: neg Gastroitestinal: neg  Hematology/Lymphatic: neg  Endocrine: neg Musculoskeletal:neg Allergy/Immunology: neg Neurological: Dizziness, seizure-like episodes tired and exhausted afterwards Psychiatric: neg Sleep : neg   ALLERGIES: Allergies  Allergen Reactions  . Fish Allergy Swelling  . Latex Dermatitis    More than a day causes blisters   . Levaquin [Levofloxacin In D5w]     n/v  . Penicillins     REACTION: shock    HOME MEDICATIONS: Outpatient Medications Prior to Visit  Medication Sig Dispense  Refill  . acetaminophen-codeine (TYLENOL #3) 300-30 MG tablet TAKE 1 TABLET BY MOUTH EVERY 6 HOURS AS NEEDED FOR BRIDGE FOR 11 DAYS 120 tablet 0  . cefUROXime (CEFTIN) 250 MG tablet Take 1 tablet (250 mg total) by mouth 2 (two) times daily for 10 days. 20 tablet 1  . cetirizine (ZYRTEC) 10 MG tablet Take 1 tablet (10 mg total) by mouth daily. 30 tablet 11  . levothyroxine (SYNTHROID, LEVOTHROID) 50 MCG tablet TAKE ONE TABLET EVERY DAY BEFORE BEFORE 90 tablet 0  . losartan (COZAAR) 25 MG tablet Take 25 mg by mouth daily.    . metoprolol succinate (TOPROL-XL) 50 MG 24 hr tablet Take 1 tablet (50 mg total) by mouth daily. Take 1 by mouth daily 90 tablet 1  . venlafaxine XR (EFFEXOR-XR) 150 MG 24 hr capsule TAKE ONE CAPSULE DAILY 90 capsule 3  . warfarin (COUMADIN) 7.5 MG tablet TAKE 1 TABLET BY MOUTH ON SUNDAY, TUESDAY, THURSDAY, 2 TABLETS ON WEDNESDAY, AND 1 AND 1/2 TABLETS ON MONDAY, FRIDAY AND SATURDAY OR AS  30 day supply.  Pt must have INR before further refills will be given 40 tablet 0  . zolpidem (AMBIEN) 10 MG tablet TAKE 1 TABLET BY MOUTH EVERY DAY AT BEDTIME AS NEEDED 90 tablet 1  . zonisamide (ZONEGRAN) 50 MG capsule TAKE 3 CAPSULES(150 MG) BY MOUTH AT BEDTIME 90 capsule 3  . metroNIDAZOLE (FLAGYL) 250 MG tablet Take 1 tablet (250 mg total) by mouth 3 (three) times daily. 30 tablet 0  . fluticasone (FLONASE) 50 MCG/ACT nasal spray SHAKE LIQUID AND USE 2 SPRAYS IN EACH NOSTRIL DAILY (Patient not taking: Reported on 03/05/2018) 16 g 0  . triamcinolone (NASACORT) 55 MCG/ACT AERO nasal inhaler Place 2 sprays into the nose daily. (Patient not taking: Reported on 03/05/2018) 1 Inhaler 12   No facility-administered medications prior to visit.     PAST MEDICAL HISTORY: Past Medical History:  Diagnosis Date  . Abdominal pain, epigastric 10/25/2007  . ADD 01/20/2009  . ALLERGIC RHINITIS 10/15/2006  . ANGIOEDEMA 08/12/2008  . ANXIETY 10/15/2006  . ASTHMATIC BRONCHITIS, ACUTE 01/27/2008  .  Cellulitis and abscess of leg, except foot 08/12/2007  . CELLULITIS, [place] 12/21/2008  . CHEST PAIN 06/14/2007  . Chronic anticoagulation 05/16/2016  . Chronic sinus infection 04/12/2010  . COLECTOMY, PARTIAL, WITH ANASTOMOSIS, HX OF 10/15/2006  . COLONIC POLYPS, HX OF 10/15/2006  . CYST, OVARIAN NEC/NOS 10/15/2006  . Dengue 02/22/2010  . DVT, HX OF 10/15/2006  . DYSAUTONOMIA 10/15/2006  . Dysfunction of eustachian tube 05/26/2009  . GI BLEEDING 04/04/2007  . Glossitis 03/06/2008  . HEADACHE, CHRONIC 04/04/2007  . Hematemesis 10/25/2007  . HEMORRHOIDS 04/04/2007  . HYPERLIPIDEMIA  12/12/2007  . HYPERTENSION 10/15/2006  . HYPOTHYROIDISM 03/06/2008  . IBS 04/04/2007  . MASTECTOMY, BILATERAL, HX OF 08/12/2009  . NECK MASS 07/01/2007  . NEOP, MALIGNANT, FEMALE BREAST NOS 10/15/2006  . 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 SYNCOPE 07/01/2007  . Wheezing 08/20/2008  . WOUND, OPEN, LEG, WITHOUT COMPLICATION 04/19/4079    PAST SURGICAL HISTORY: Past Surgical History:  Procedure Laterality Date  . ABDOMINAL HYSTERECTOMY    . bilateral mastectomy/reconstruction    . CESAREAN SECTION    . CHOLECYSTECTOMY    . COLONOSCOPY W/ BIOPSIES    . FLEXIBLE SIGMOIDOSCOPY    . LEFT AND RIGHT HEART CATHETERIZATION WITH CORONARY ANGIOGRAM N/A 10/07/2012   Procedure: LEFT AND RIGHT HEART CATHETERIZATION WITH CORONARY ANGIOGRAM;  Surgeon: Jolaine Artist, MD;  Location: [facility] CATH LAB;  Service: Cardiovascular;  Laterality: N/A;  . multiple ablations    . OOPHORECTOMY    . open heart surg    . spigellian hernia      FAMILY HISTORY: Family History  Problem Relation Age of Onset  . Heart disease Mother   . Cancer Sister        ovarian cancer  . Pancreatic cancer Father     SOCIAL HISTORY: Social History   Socioeconomic History  . Marital status: Married    Spouse name: Not on file  . Number of children: Not on file  . Years of education: Not on file  . Highest education level: Not on file  Occupational  History  . Not on file  Social Needs  . Financial resource strain: Not on file  . Food insecurity:    Worry: Not on file    Inability: Not on file  . Transportation needs:    Medical: Not on file    Non-medical: Not on file  Tobacco Use  . Smoking status: Never Smoker  . Smokeless tobacco: Never Used  Substance and Sexual Activity  . Alcohol use: No  . Drug use: No  . Sexual activity: Not Currently  Lifestyle  . Physical activity:    Days per week: Not on file    Minutes per session: Not on file  . Stress: Not on file  Relationships  . Social connections:    Talks on phone: Not on file    Gets together: Not on file    Attends religious service: Not on file    Active member of club or organization: Not on file    Attends meetings of clubs or organizations: Not on file    Relationship status: Not on file  . Intimate partner violence:    Fear of current or ex partner: Not on file    Emotionally abused: Not on file    Physically abused: Not on file    Forced sexual activity: Not on file  Other Topics Concern  . Not on file  Social History Narrative   [place] a few times per year.   Non smoking           PHYSICAL EXAM  Vitals:   03/05/18 1411  BP: (!) 146/82  Pulse: 63  SpO2: 95%  Height: 5\' 8"  (1.727 m)   Body mass index is 32.77 kg/m.  Generalized: Well developed, obese female in no acute distress  Head: normocephalic and atraumatic,. Oropharynx benign  Neck: Supple,  Musculoskeletal: No deformity   Neurological examination   Mentation: Alert oriented to time, place, history taking. Attention span and concentration appropriate. Recent and remote memory intact.  Follows  all commands speech and language fluent.   Cranial nerve II-XII: upils were equal round reactive to light extraocular movements were full, visual field were full on confrontational test. Facial sensation and strength were normal. hearing was intact to finger rubbing bilaterally.  Uvula tongue midline. head turning and shoulder shrug were normal and symmetric.Tongue protrusion into cheek strength was normal. Motor: normal bulk and tone, full strength in the BUE, BLE, Sensory: normal and symmetric to light touch, pinprick, and  Vibration, in the upper and lower extremities Coordination: finger-nose-finger, heel-to-shin bilaterally, no dysmetria Reflexes: Symmetric upper and lower plantar responses were flexor bilaterally. Gait and Station: Rising up from seated position without assistance, normal stance,  moderate stride, good arm swing, smooth turning, able to perform tiptoe, and heel walking without difficulty. Tandem gait is steady  DIAGNOSTIC DATA (LABS, IMAGING, TESTING) - I reviewed patient records, labs, notes, testing and imaging myself where available.  Lab Results  Component Value Date   WBC 7.5 09/12/2017   HGB 13.0 09/12/2017   HCT 38.4 09/12/2017   MCV 76.1 (L) 09/12/2017   PLT 333.0 09/12/2017      Component Value Date/Time   NA 134 (L) 09/12/2017 1617   K 3.9 09/12/2017 1617   CL 105 09/12/2017 1617   CO2 22 09/12/2017 1617   GLUCOSE 101 (H) 09/12/2017 1617   BUN 17 09/12/2017 1617   CREATININE 1.03 09/12/2017 1617   CALCIUM 9.3 09/12/2017 1617   PROT 6.7 08/14/2017 1641   ALBUMIN 4.1 08/14/2017 1641   AST 18 08/14/2017 1641   ALT 17 08/14/2017 1641   ALKPHOS 100 08/14/2017 1641   BILITOT 0.3 08/14/2017 1641   GFRNONAA 70 02/11/2008 1444   GFRAA 84 02/11/2008 1444   Lab Results  Component Value Date   CHOL 209 (H) 12/13/2011   HDL 45.10 12/13/2011   LDLCALC 73 02/11/2008   LDLDIRECT 137.4 12/13/2011   TRIG 195.0 (H) 12/13/2011   CHOLHDL 5 12/13/2011   Lab Results  Component Value Date   HGBA1C  08/12/2007    5.2 (NOTE)   The ADA recommends the following therapeutic goal for glycemic   control related to Hgb A1C measurement:   Goal of Therapy:   < 7.0% Hgb A1C   Reference: American Diabetes Association: Clinical Practice    Recommendations 2008, Diabetes Care,  2008, 31:(Suppl 1).   Lab Results  Component Value Date   VITAMINB12 820 02/11/2008   Lab Results  Component Value Date   TSH 3.04 10/02/2012      ASSESSMENT AND PLAN  63 y.o. year old female  has a past medical here to follow-up for ocular motility disturbance fatigue migraine equivalent.  Patient has had side effects to Diamox and topiramate in the past she is currently on Zonisamide 150mg  at hs.    PLAN: Increase Zonegran to 200mg  daily at night ( changed rx to 100mg  cap) Call for sooner appointment if worsening neurologic symptoms Follow up in 3 months Dennie Bible, [facility], [facility], [facility] [address] [place], [place] [address] [phone]

## 2018-03-05 NOTE — Telephone Encounter (Signed)
I called and spoke with pt. She would like to come in today for f/u if possible. She will be in [place] area today. Amy Lomax did not have any openings. I scheduled with CM,NP at 2:15pm. Advised her to check in 30 min early. . She is agreeable to increase dose for zonisamide that Dr. Felecia Shelling recommended.  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 This is what Dr. Felecia Shelling has been following her for, this is not new. She just wanted NP to be aware. Advised I will send message to her.

## 2018-03-05 NOTE — Progress Notes (Signed)
I have read the note, and I agree with the clinical assessment and plan.  Jestin Burbach A. Valdemar Mcclenahan, MD, PhD, FAAN Certified in Neurology, Clinical Neurophysiology, Sleep Medicine, Pain Medicine and Neuroimaging  [facility] [address], [address] , [place] [address] [phone]  

## 2018-03-05 NOTE — Telephone Encounter (Signed)
Spoke with Dr. Felecia Shelling- can call in zonisamide 100mg  (take 2 at bedtime-total 200mg  qhs). She should make a follow up with Debbora Presto, NP. Last seen 09/2017. Her appt 02/2018 was cx d/t our office closing for inclement weather.

## 2018-03-08 ENCOUNTER — Encounter: Payer: Self-pay | Admitting: Internal Medicine

## 2018-03-19 ENCOUNTER — Other Ambulatory Visit: Payer: Self-pay | Admitting: Internal Medicine

## 2018-03-19 DIAGNOSIS — K580 Irritable bowel syndrome with diarrhea: Secondary | ICD-10-CM

## 2018-03-19 NOTE — Telephone Encounter (Signed)
Done erx 

## 2018-04-02 ENCOUNTER — Encounter: Payer: Self-pay | Admitting: Internal Medicine

## 2018-04-02 ENCOUNTER — Other Ambulatory Visit: Payer: Self-pay | Admitting: Internal Medicine

## 2018-04-03 MED ORDER — CEFUROXIME AXETIL 250 MG PO TABS: 250.0000 mg | ORAL_TABLET | Freq: Two times a day (BID) | ORAL | 0 refills | Status: AC

## 2018-04-11 ENCOUNTER — Other Ambulatory Visit: Payer: Self-pay | Admitting: General Practice

## 2018-04-11 DIAGNOSIS — Z7901 Long term (current) use of anticoagulants: Secondary | ICD-10-CM

## 2018-04-11 DIAGNOSIS — Z5181 Encounter for therapeutic drug level monitoring: Secondary | ICD-10-CM

## 2018-04-11 MED ORDER — WARFARIN SODIUM 7.5 MG PO TABS: ORAL_TABLET | ORAL | 0 refills | Status: DC

## 2018-04-15 ENCOUNTER — Other Ambulatory Visit: Payer: Self-pay | Admitting: Internal Medicine

## 2018-04-16 ENCOUNTER — Ambulatory Visit: Payer: BLUE CROSS/BLUE SHIELD

## 2018-04-19 ENCOUNTER — Other Ambulatory Visit: Payer: Self-pay | Admitting: General Practice

## 2018-04-19 ENCOUNTER — Telehealth: Payer: Self-pay | Admitting: Emergency Medicine

## 2018-04-19 ENCOUNTER — Ambulatory Visit (INDEPENDENT_AMBULATORY_CARE_PROVIDER_SITE_OTHER): Payer: BLUE CROSS/BLUE SHIELD | Admitting: General Practice

## 2018-04-19 ENCOUNTER — Ambulatory Visit: Payer: BLUE CROSS/BLUE SHIELD

## 2018-04-19 DIAGNOSIS — Z5181 Encounter for therapeutic drug level monitoring: Secondary | ICD-10-CM

## 2018-04-19 DIAGNOSIS — K580 Irritable bowel syndrome with diarrhea: Secondary | ICD-10-CM

## 2018-04-19 LAB — POCT INR: INR: 3.9 — AB (ref 2.0–3.0)

## 2018-04-19 MED ORDER — WARFARIN SODIUM 1 MG PO TABS: ORAL_TABLET | ORAL | 0 refills | Status: DC

## 2018-04-19 MED ORDER — WARFARIN SODIUM 10 MG PO TABS: ORAL_TABLET | ORAL | 0 refills | Status: DC

## 2018-04-19 MED ORDER — ACETAMINOPHEN-CODEINE #3 300-30 MG PO TABS: ORAL_TABLET | ORAL | 0 refills | Status: DC

## 2018-04-19 NOTE — Telephone Encounter (Signed)
Done erx 

## 2018-04-19 NOTE — Telephone Encounter (Signed)
Pt is requesting a refill on her acetaminophen-codeine (TYLENOL #3) 300-30 MG tablet. Pharmacy is [facility]. Thanks.

## 2018-04-19 NOTE — Addendum Note (Signed)
Addended by: Biagio Borg on: 04/19/2018 12:36 PM   Modules accepted: Orders

## 2018-04-19 NOTE — Patient Instructions (Addendum)
Pre visit review using our clinic review tool, if applicable. No additional management support is needed unless otherwise documented below in the visit note.  Take current dosage of 1 tablet on Sun/Tues/thursday, 2 tablets on Wed. And 1 1/2 tablets on Mon/Friday and Sat. Until you run out of 7.5 mg tablets.  Once you pick up the 10 mg tablets and the 1 mg tablets start taking 1 (10 mg) tablet daily except 1 (10 mg) and 1 (1 mg) tablet on Wednesdays.  Re-check in 6 weeks.

## 2018-05-02 ENCOUNTER — Ambulatory Visit (INDEPENDENT_AMBULATORY_CARE_PROVIDER_SITE_OTHER): Payer: BLUE CROSS/BLUE SHIELD | Admitting: Internal Medicine

## 2018-05-02 ENCOUNTER — Encounter: Payer: Self-pay | Admitting: Internal Medicine

## 2018-05-02 DIAGNOSIS — I10 Essential (primary) hypertension: Secondary | ICD-10-CM | POA: Diagnosis not present

## 2018-05-02 DIAGNOSIS — J069 Acute upper respiratory infection, unspecified: Secondary | ICD-10-CM

## 2018-05-02 DIAGNOSIS — F411 Generalized anxiety disorder: Secondary | ICD-10-CM | POA: Diagnosis not present

## 2018-05-02 MED ORDER — CEFUROXIME AXETIL 250 MG PO TABS: 250.0000 mg | ORAL_TABLET | Freq: Two times a day (BID) | ORAL | 1 refills | Status: AC

## 2018-05-02 NOTE — Assessment & Plan Note (Signed)
Mild to mod, for antibx course,  to f/u any worsening symptoms or concerns 

## 2018-05-02 NOTE — Assessment & Plan Note (Signed)
stable overall by history and exam, recent data reviewed with pt, and pt to continue medical treatment as before,  to f/u any worsening symptoms or concerns  

## 2018-05-02 NOTE — Assessment & Plan Note (Signed)
Encouraged pt to continue to monitor BP at home, goal < 140/90

## 2018-05-02 NOTE — Progress Notes (Signed)
Patient ID: [name], female   DOB: 1955/07/15, 63 y.o.   MRN: [id number]  Virtual Visit via Video Note  I connected with [name] on 05/02/18 at  1:00 PM EDT by a video enabled telemedicine application and verified that I am speaking with the correct person using two identifiers.  Location: Patient: at home with husband off camera Provider: at office   I discussed the limitations of evaluation and management by telemedicine and the availability of in person appointments. The patient expressed understanding and agreed to proceed.  History of Present Illness:  Here with 2-3 days acute onset fever, facial pain, bilat ear pressure, headache, general weakness and malaise, and bloody right ear d/c, with no ST or cough cough, and pt denies chest pain, wheezing, increased sob or doe, orthopnea, PND, increased LE swelling, palpitations, dizziness or syncope. Pt denies new neurological symptoms such as new headache, or facial or extremity weakness or numbness   Pt denies polydipsia, polyuria, Denies worsening depressive symptoms, suicidal ideation, or panic Past Medical History:  Diagnosis Date  . Abdominal pain, epigastric 10/25/2007  . ADD 01/20/2009  . ALLERGIC RHINITIS 10/15/2006  . ANGIOEDEMA 08/12/2008  . ANXIETY 10/15/2006  . ASTHMATIC BRONCHITIS, ACUTE 01/27/2008  . Cellulitis and abscess of leg, except foot 08/12/2007  . CELLULITIS, [place] 12/21/2008  . CHEST PAIN 06/14/2007  . Chronic anticoagulation 05/16/2016  . Chronic sinus infection 04/12/2010  . COLECTOMY, PARTIAL, WITH ANASTOMOSIS, HX OF 10/15/2006  . COLONIC POLYPS, HX OF 10/15/2006  . CYST, OVARIAN NEC/NOS 10/15/2006  . Dengue 02/22/2010  . DVT, HX OF 10/15/2006  . DYSAUTONOMIA 10/15/2006  . Dysfunction of eustachian tube 05/26/2009  . GI BLEEDING 04/04/2007  . Glossitis 03/06/2008  . HEADACHE, CHRONIC 04/04/2007  . Hematemesis 10/25/2007  . HEMORRHOIDS 04/04/2007  . HYPERLIPIDEMIA 12/12/2007  . HYPERTENSION 10/15/2006  .  HYPOTHYROIDISM 03/06/2008  . IBS 04/04/2007  . MASTECTOMY, BILATERAL, HX OF 08/12/2009  . NECK MASS 07/01/2007  . NEOP, MALIGNANT, FEMALE BREAST NOS 10/15/2006  . 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 SYNCOPE 07/01/2007  . Wheezing 08/20/2008  . WOUND, OPEN, LEG, WITHOUT COMPLICATION 07/05/5007   Past Surgical History:  Procedure Laterality Date  . ABDOMINAL HYSTERECTOMY    . bilateral mastectomy/reconstruction    . CESAREAN SECTION    . CHOLECYSTECTOMY    . COLONOSCOPY W/ BIOPSIES    . FLEXIBLE SIGMOIDOSCOPY    . LEFT AND RIGHT HEART CATHETERIZATION WITH CORONARY ANGIOGRAM N/A 10/07/2012   Procedure: LEFT AND RIGHT HEART CATHETERIZATION WITH CORONARY ANGIOGRAM;  Surgeon: Jolaine Artist, MD;  Location: [facility] CATH LAB;  Service: Cardiovascular;  Laterality: N/A;  . multiple ablations    . OOPHORECTOMY    . open heart surg    . spigellian hernia      reports that she has never smoked. She has never used smokeless tobacco. She reports that she does not drink alcohol or use drugs. family history includes Cancer in her sister; Heart disease in her mother; Pancreatic cancer in her father. Allergies  Allergen Reactions  . Fish Allergy Swelling  . Latex Dermatitis    More than a day causes blisters   . Levaquin [Levofloxacin In D5w]     n/v  . Penicillins     REACTION: shock   Current Outpatient Medications on File Prior to Visit  Medication Sig Dispense Refill  . acetaminophen-codeine (TYLENOL #3) 300-30 MG tablet 1 tab by mouth four times per day as needed 120 tablet 0  .  cetirizine (ZYRTEC) 10 MG tablet Take 1 tablet (10 mg total) by mouth daily. 30 tablet 11  . fluticasone (FLONASE) 50 MCG/ACT nasal spray SHAKE LIQUID AND USE 2 SPRAYS IN EACH NOSTRIL DAILY (Patient not taking: Reported on 03/05/2018) 16 g 0  . levothyroxine (SYNTHROID, LEVOTHROID) 50 MCG tablet TAKE ONE TABLET EVERY DAY 90 tablet 0  . losartan (COZAAR) 25 MG tablet Take 25 mg by mouth daily.    . metoprolol succinate  (TOPROL-XL) 50 MG 24 hr tablet Take 1 tablet (50 mg total) by mouth daily. Take 1 by mouth daily 90 tablet 1  . triamcinolone (NASACORT) 55 MCG/ACT AERO nasal inhaler Place 2 sprays into the nose daily. (Patient not taking: Reported on 03/05/2018) 1 Inhaler 12  . venlafaxine XR (EFFEXOR-XR) 150 MG 24 hr capsule TAKE ONE CAPSULE DAILY 90 capsule 3  . warfarin (COUMADIN) 1 MG tablet Take 1 tablet on Wednesdays or as directed by anticoagulation clinic 30 tablet 0  . warfarin (COUMADIN) 10 MG tablet Take 1 tablet daily or as directed by anticoagulation clinic 90 tablet 0  . zolpidem (AMBIEN) 10 MG tablet TAKE 1 TABLET BY MOUTH EVERY DAY AT BEDTIME AS NEEDED 90 tablet 1  . zonisamide (ZONEGRAN) 100 MG capsule Take 2 capsules (200 mg total) by mouth at bedtime. 180 capsule 1   No current facility-administered medications on file prior to visit.    Observations/Objective: Alert, mild ill appearing, mild nervous, resps normal, cn 2-12 intact, moves all 4s, appropriate mood and affect, no visible rash or swelling Lab Results  Component Value Date   WBC 7.5 09/12/2017   HGB 13.0 09/12/2017   HCT 38.4 09/12/2017   PLT 333.0 09/12/2017   GLUCOSE 101 (H) 09/12/2017   CHOL 209 (H) 12/13/2011   TRIG 195.0 (H) 12/13/2011   HDL 45.10 12/13/2011   LDLDIRECT 137.4 12/13/2011   LDLCALC 73 02/11/2008   ALT 17 08/14/2017   AST 18 08/14/2017   NA 134 (L) 09/12/2017   K 3.9 09/12/2017   CL 105 09/12/2017   CREATININE 1.03 09/12/2017   BUN 17 09/12/2017   CO2 22 09/12/2017   TSH 3.04 10/02/2012   INR 3.9 (A) 04/19/2018   HGBA1C  08/12/2007    5.2 (NOTE)   The ADA recommends the following therapeutic goal for glycemic   control related to Hgb A1C measurement:   Goal of Therapy:   < 7.0% Hgb A1C   Reference: American Diabetes Association: Clinical Practice   Recommendations 2008, Diabetes Care,  2008, 31:(Suppl 1).   Assessment and Plan: See notes  Follow Up Instructions: See notes   I discussed the  assessment and treatment plan with the patient. The patient was provided an opportunity to ask questions and all were answered. The patient agreed with the plan and demonstrated an understanding of the instructions.   The patient was advised to call back or seek an in-person evaluation if the symptoms worsen or if the condition fails to improve as anticipated.   Cathlean Cower, MD

## 2018-05-02 NOTE — Patient Instructions (Signed)
Please take all new medication as prescribed - the antibiotic  Please continue all other medications as before, and refills have been done if requested.  Please have the pharmacy call with any other refills you may need.  Please continue your efforts at being more active, low cholesterol diet, and weight control.  Please keep your appointments with your specialists as you may have planned    

## 2018-05-24 ENCOUNTER — Telehealth: Payer: Self-pay | Admitting: Internal Medicine

## 2018-05-24 DIAGNOSIS — K580 Irritable bowel syndrome with diarrhea: Secondary | ICD-10-CM

## 2018-05-24 MED ORDER — ACETAMINOPHEN-CODEINE #3 300-30 MG PO TABS: ORAL_TABLET | ORAL | 0 refills | Status: DC

## 2018-05-24 NOTE — Telephone Encounter (Unsigned)
Copied from [place] [id number]. Topic: Quick Communication - Rx Refill/Question >> May 24, 2018  2:32 PM Celene Kras A wrote: Medication: acetaminophen-codeine (TYLENOL #3) 300-30 MG tablet  Has the patient contacted their pharmacy? Yes.   (Agent: If no, request that the patient contact the pharmacy for the refill.) (Agent: If yes, when and what did the pharmacy advise?)  Preferred Pharmacy (with phone number or street name): [facility] [place], [address] ([address] [place] [address] Phone: [phone] Fax: [phone] Open 24 hours    Agent: Please be advised that RX refills may take up to 3 business days. We ask that you follow-up with your pharmacy.

## 2018-05-24 NOTE — Telephone Encounter (Signed)
Done erx 

## 2018-05-28 ENCOUNTER — Telehealth: Payer: Self-pay

## 2018-05-28 NOTE — Telephone Encounter (Signed)
Spoke with the patient and they have given verbal consent to file their insurance and to do a [email or web address] visit. E-mail, mobile number and carrier have been confirmed and sent.  E-mail: [email or web address]  Text: [phone] ([employer])

## 2018-05-31 ENCOUNTER — Other Ambulatory Visit: Payer: Self-pay | Admitting: General Practice

## 2018-05-31 ENCOUNTER — Other Ambulatory Visit: Payer: Self-pay

## 2018-05-31 ENCOUNTER — Ambulatory Visit (INDEPENDENT_AMBULATORY_CARE_PROVIDER_SITE_OTHER): Payer: BLUE CROSS/BLUE SHIELD | Admitting: General Practice

## 2018-05-31 DIAGNOSIS — Z5181 Encounter for therapeutic drug level monitoring: Secondary | ICD-10-CM

## 2018-05-31 LAB — POCT INR: INR: 3 (ref 2.0–3.0)

## 2018-05-31 MED ORDER — WARFARIN SODIUM 10 MG PO TABS: ORAL_TABLET | ORAL | 0 refills | Status: AC

## 2018-05-31 NOTE — Patient Instructions (Addendum)
Pre visit review using our clinic review tool, if applicable. No additional management support is needed unless otherwise documented below in the visit note.  Continue to take 10 mg daily.  Re-check in 6 weeks.

## 2018-06-05 ENCOUNTER — Ambulatory Visit (INDEPENDENT_AMBULATORY_CARE_PROVIDER_SITE_OTHER): Payer: BC Managed Care – PPO | Admitting: Family Medicine

## 2018-06-05 ENCOUNTER — Other Ambulatory Visit: Payer: Self-pay

## 2018-06-05 ENCOUNTER — Encounter: Payer: Self-pay | Admitting: Family Medicine

## 2018-06-05 DIAGNOSIS — H539 Unspecified visual disturbance: Secondary | ICD-10-CM

## 2018-06-05 DIAGNOSIS — R404 Transient alteration of awareness: Secondary | ICD-10-CM

## 2018-06-05 DIAGNOSIS — H518 Other specified disorders of binocular movement: Secondary | ICD-10-CM | POA: Diagnosis not present

## 2018-06-05 NOTE — Progress Notes (Signed)
PATIENT: [name] DOB: 1955/06/06  REASON FOR VISIT: follow up HISTORY FROM: patient  Virtual Visit via Telephone Note  I connected with [name] on 06/05/18 at  2:30 PM EDT by telephone and verified that I am speaking with the correct person using two identifiers.   I discussed the limitations, risks, security and privacy concerns of performing an evaluation and management service by telephone and the availability of in person appointments. I also discussed with the patient that there may be a patient responsible charge related to this service. The patient expressed understanding and agreed to proceed.   History of Present Illness:  06/05/18 [name] is a 63 y.o. female here today for follow up of presumed occular migraines. 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 She is taking Zonegran 200mg  at bedtime.  She reports that these events continue.  That she is having multiple events a week.  She describes these events as abnormal movements of both eyes.  She reports that a witness told her that her eyes will go in opposite directions and jerk.  She states that during event she is unable to talk or respond.  She reports being unaware of her surroundings during the event.  She states that she is unable to walk during event.  Her husband is with her today and states that he has to have her sit or lie down.  She is very tired and confused following the event.  She has noticed that her heart will race during the event.  She denies tonic-clonic movements of any extremities or other parts of the body.  She denies incontinence or biting of her tongue.  She denies headache with the event.  She denies chest pain.  There is no history of seizure activity that she is aware of.  There is no family history that she is aware of.  She reports that with the increase of Zonegran 200 mg at bedtime symptoms remain consistent and have not improved at all.  She is still driving.   She has a follow-up with her cardiologist at [facility].  She does have a significant cardiac history of hypertrophic cardiomyopathy.  She does have a pacemaker.   HISTORY (copied from [facility] note on 03/05/2018)  UPDATE 3/3/2020CM Ms. Ribera, 63 year old female returns for follow-up with history of abnormal eye movements and frequent flashing in the field of vision.  She states this happens at least daily and is brief 10 to 15 seconds.  It always happens when she is either standing or seated never when lying down.  She has been on Diamox in the past and also topiramate with side effects from both.  According to Dr. Garth Bigness recent note he has felt this was ocular migraine.  She does not experience headache afterwards but is just fatigued and washed out.  She had called in earlier today wanting to be seen in Dr. Garth Bigness recommendation was to increase her  zonisamide to 200mg  at hs.  She saw eye doctor who gave her official dx ofoculogyric crisis   Update 09/06/2017:RS She has had multiple episodes of abnormal eye movements and frequent flashing in the visual field. These were felt to be ocular migraines. Diamox helped the episodes some but she did not tolerate it well due to GI side effects. She is already metoprolol and it does not help.   She is still having the spells of visual scintillations daily.This will occur in the visual field, either to  the left or the right. It lasts for about 15 minutes and then improves. She also has the oculogyric spells 1-2 times a weekthese also last about 15 minutes and are associated with vertigo. If she is driving she needs to pull over. The spells are not triggered by activity. They occur while sitting or standing but not while laying down.   She does not experience headaches with these.   From 05/31/2017: She is a 63 yo woman who has had several episodes of abnormal eye movements since around August 2018. The episode lasts  1-3 minutes and she feels she cannot walk for 15 minutes or so after each minute due to severe vertigo. She also notes lip numbness and left arm (rarely arm and leg) numbness for a few minutes while she is dizzy. She sometimes gets a headache. She is experiencing them 1-2 times a week. She does not take nausea or anti-psychotic medications. She has been prescribed meclizine but has never taken it. She gets a sensation like someone is pushing a finger into her upper chest above the sternum when these episodes occur.   She has near daily episodes of flashing in her vision since open heart surgery April 19, 2016. These spells started while in the hospital. They have been called ocular migraines. She does not get a headache but notes some eye pain for a bout 1 hour when this occurs.   I personally reviewed the CT scan of the head and sinuses dated 09/13/2016. The brain is normal for age. There is no significant atrophy and no evidence of stroke or other changes. The sinuses were normal. The internal auditory canals appear normal.  She has a strong FH of migraines but mother and sister do not get aura.   She has a pacemaker.   She had dysautonomia in the past.   Observations/Objective:  Generalized: Well developed, in no acute distress  Mentation: Alert oriented to time, place, history taking. Follows all commands speech and language fluent   Assessment and Plan:  63 y.o. year old female  has a past medical history of Abdominal pain, epigastric (10/25/2007), ADD (01/20/2009), ALLERGIC RHINITIS (10/15/2006), ANGIOEDEMA (08/12/2008), ANXIETY (10/15/2006), ASTHMATIC BRONCHITIS, ACUTE (01/27/2008), Cellulitis and abscess of leg, except foot (08/12/2007), CELLULITIS, FACE (12/21/2008), CHEST PAIN (06/14/2007), Chronic anticoagulation (05/16/2016), Chronic sinus infection (04/12/2010), COLECTOMY, PARTIAL, WITH ANASTOMOSIS, HX OF (10/15/2006), COLONIC POLYPS, HX OF  (10/15/2006), CYST, OVARIAN NEC/NOS (10/15/2006), Dengue (02/22/2010), DVT, HX OF (10/15/2006), DYSAUTONOMIA (10/15/2006), Dysfunction of eustachian tube (05/26/2009), GI BLEEDING (04/04/2007), Glossitis (03/06/2008), HEADACHE, CHRONIC (04/04/2007), Hematemesis (10/25/2007), HEMORRHOIDS (04/04/2007), HYPERLIPIDEMIA (12/12/2007), HYPERTENSION (10/15/2006), HYPOTHYROIDISM (03/06/2008), IBS (04/04/2007), MASTECTOMY, BILATERAL, HX OF (08/12/2009), NECK MASS (07/01/2007), NEOP, MALIGNANT, FEMALE BREAST NOS (10/15/2006), NEOP, MALIGNANT, 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 here with    ICD-10-CM   1. Episodic altered awareness R40.4 EEG    MR BRAIN W WO CONTRAST  2. Vision disturbance H53.9 EEG    MR BRAIN W WO CONTRAST  3. Oculogyric crisis H51.8    Mrs Berhow continues to have transient events of altered awareness.  Patient has associated with inability to talk, inability to walk, jerking movements of her eyes, fatigue and confusion following.  I have consulted with Dr. Felecia Shelling regarding these concerns.  We will order an EEG for evaluation of seizure activity.  MRI with and without contrast will also be ordered.  She was instructed to continue medications as prescribed.  I have advised her not to drive until  evaluated by Dr. Felecia Shelling.  We will schedule follow-up with Dr. Felecia Shelling following EEG results.  She verbalizes understanding and agreement with this plan.  Orders Placed This Encounter  Procedures   MR BRAIN W WO CONTRAST    Standing Status:   Future    Standing Expiration Date:   08/05/2019    Order Specific Question:   ** REASON FOR EXAM (FREE TEXT)    Answer:   concerns of seizure, altered awareness, inability to walk/talk during event    Order Specific Question:   If indicated for the ordered procedure, I authorize the administration of contrast media per Radiology protocol    Answer:   Yes    Order Specific Question:   What is the  patient's sedation requirement?    Answer:   No Sedation    Order Specific Question:   Does the patient have a pacemaker or implanted devices?    Answer:   Yes    Order Specific Question:   Radiology Contrast Protocol - do NOT remove file path    Answer:   \[email or web address]    Order Specific Question:   Preferred imaging location?    Answer:   Internal   EEG    Standing Status:   Future    Standing Expiration Date:   06/05/2019    Order Specific Question:   Where should this test be performed?    Answer:   [facility]    No orders of the defined types were placed in this encounter.    Follow Up Instructions:  I discussed the assessment and treatment plan with the patient. The patient was provided an opportunity to ask questions and all were answered. The patient agreed with the plan and demonstrated an understanding of the instructions.   The patient was advised to call back or seek an in-person evaluation if the symptoms worsen or if the condition fails to improve as anticipated.  I provided 30 minutes of non-face-to-face time during this encounter.  Patient is located at her place of residence during video conference.  Provider is located at her place of residence.  Maryelizabeth Kaufmann, CMA helped to facilitate visit.   Debbora Presto, NP

## 2018-06-05 NOTE — Progress Notes (Signed)
I have read the note, and I agree with the clinical assessment and plan.  Richard A. Sater, MD, PhD, FAAN Certified in Neurology, Clinical Neurophysiology, Sleep Medicine, Pain Medicine and Neuroimaging  [facility] [address], [address] [place], [place] [address] [phone]  

## 2018-06-06 ENCOUNTER — Telehealth: Payer: Self-pay | Admitting: Family Medicine

## 2018-06-06 DIAGNOSIS — Z950 Presence of cardiac pacemaker: Secondary | ICD-10-CM | POA: Diagnosis not present

## 2018-06-06 DIAGNOSIS — I422 Other hypertrophic cardiomyopathy: Secondary | ICD-10-CM | POA: Diagnosis not present

## 2018-06-06 DIAGNOSIS — I447 Left bundle-branch block, unspecified: Secondary | ICD-10-CM | POA: Insufficient documentation

## 2018-06-06 DIAGNOSIS — Z954 Presence of other heart-valve replacement: Secondary | ICD-10-CM | POA: Diagnosis not present

## 2018-06-06 DIAGNOSIS — I340 Nonrheumatic mitral (valve) insufficiency: Secondary | ICD-10-CM | POA: Diagnosis not present

## 2018-06-06 DIAGNOSIS — Z79899 Other long term (current) drug therapy: Secondary | ICD-10-CM | POA: Diagnosis not present

## 2018-06-06 DIAGNOSIS — I480 Paroxysmal atrial fibrillation: Secondary | ICD-10-CM | POA: Diagnosis not present

## 2018-06-06 DIAGNOSIS — Z952 Presence of prosthetic heart valve: Secondary | ICD-10-CM | POA: Diagnosis not present

## 2018-06-06 DIAGNOSIS — Z7901 Long term (current) use of anticoagulants: Secondary | ICD-10-CM | POA: Diagnosis not present

## 2018-06-06 NOTE — Telephone Encounter (Signed)
Medicare/BCBS SMOL:[id number] (exp. 06/06/18 to 07/05/18) order sent to [facility]. They will reach out to the patient to schedule.

## 2018-06-10 ENCOUNTER — Telehealth: Payer: Self-pay | Admitting: Family Medicine

## 2018-06-10 NOTE — Telephone Encounter (Signed)
Do you have any information on her pacemaker?

## 2018-06-10 NOTE — Telephone Encounter (Signed)
No. Patient has info. I asked her to find that and would have you guys call for info. Thank you.

## 2018-06-10 NOTE — Telephone Encounter (Signed)
Pt called stating that [facility] called her about her MRI and they informed her that they can not do her MRI due to her having a pacemaker. Also she would like to know which hospital will she be going to for her EEG. Please advise.

## 2018-06-11 ENCOUNTER — Other Ambulatory Visit: Payer: Self-pay | Admitting: Family Medicine

## 2018-06-11 DIAGNOSIS — R404 Transient alteration of awareness: Secondary | ICD-10-CM

## 2018-06-11 DIAGNOSIS — G43109 Migraine with aura, not intractable, without status migrainosus: Secondary | ICD-10-CM

## 2018-06-11 DIAGNOSIS — H539 Unspecified visual disturbance: Secondary | ICD-10-CM

## 2018-06-11 NOTE — Telephone Encounter (Signed)
I spoke to the patient and she gave me information on her pacemaker.  Make: [employer] Ref # [id number] SN# [id number] H CONF# [id number] Implant date: 2017  Please put a new order in for [facility].  Also, I informed her that the EEG will be scheduled at our office and I asked Lovena Le to contact the patient to schedule the EEG.

## 2018-06-11 NOTE — Telephone Encounter (Signed)
Thank you Raquel Sarna! MRI ordered with [facility].

## 2018-06-11 NOTE — Telephone Encounter (Signed)
Noted, your welcome.

## 2018-06-12 DIAGNOSIS — Z954 Presence of other heart-valve replacement: Secondary | ICD-10-CM | POA: Diagnosis not present

## 2018-06-12 DIAGNOSIS — Z952 Presence of prosthetic heart valve: Secondary | ICD-10-CM | POA: Diagnosis not present

## 2018-06-12 DIAGNOSIS — I421 Obstructive hypertrophic cardiomyopathy: Secondary | ICD-10-CM | POA: Diagnosis not present

## 2018-06-12 DIAGNOSIS — I071 Rheumatic tricuspid insufficiency: Secondary | ICD-10-CM | POA: Diagnosis not present

## 2018-06-12 DIAGNOSIS — I422 Other hypertrophic cardiomyopathy: Secondary | ICD-10-CM | POA: Diagnosis not present

## 2018-06-12 DIAGNOSIS — I480 Paroxysmal atrial fibrillation: Secondary | ICD-10-CM | POA: Diagnosis not present

## 2018-06-13 NOTE — Telephone Encounter (Signed)
Patient MRI is scheduled at [facility] for 07/03/18.

## 2018-06-19 ENCOUNTER — Other Ambulatory Visit: Payer: Self-pay

## 2018-06-19 ENCOUNTER — Ambulatory Visit (INDEPENDENT_AMBULATORY_CARE_PROVIDER_SITE_OTHER): Payer: BC Managed Care – PPO | Admitting: Neurology

## 2018-06-19 DIAGNOSIS — R404 Transient alteration of awareness: Secondary | ICD-10-CM

## 2018-06-19 DIAGNOSIS — H539 Unspecified visual disturbance: Secondary | ICD-10-CM

## 2018-06-19 DIAGNOSIS — R4182 Altered mental status, unspecified: Secondary | ICD-10-CM

## 2018-06-19 NOTE — Progress Notes (Signed)
   [facility]  EEG (ELECTROENCEPHALOGRAM) REPORT   STUDY DATE: 06/19/2018 PATIENT NAME: [name] DOB: 1955/09/19 MRN: [id number]  ORDERING CLINICIAN: Debbora Presto, NP Arlice Colt MD  TECHNOLOGIST: Milana Na RPSGT TECHNIQUE: Electroencephalogram was recorded utilizing standard 10-20 system of lead placement and reformatted into average and bipolar montages.  RECORDING TIME: 23 m 33 s  CLINICAL INFORMATION: 63 year old woman with transient alteration of awareness  FINDINGS: A digital EEG was performed while the patient was awake and drowsy. While awake and most alert there was a 10 hz posterior dominant rhythm. Voltages and frequencies were symmetric.  There were no focal, lateralizing, epileptiform activity or seizures seen.  Photic stimulation had a normal driving response. Hyperventilation and recovery did not change the underlying rhythms. EKG channel shows NSR.   She became drowsy on one occasion but did not enter definitive sleep  IMPRESSION: This is a normal EEG patient was awake and drowsy   INTERPRETING PHYSICIAN:   Massimiliano Rohleder A. Felecia Shelling, MD, PhD, [facility] Certified in Neurology, Clinical Neurophysiology, Sleep Medicine, Pain Medicine and Neuroimaging  [facility] [address], [place], [place] [address] [phone]

## 2018-06-25 ENCOUNTER — Telehealth: Payer: Self-pay | Admitting: Internal Medicine

## 2018-06-25 DIAGNOSIS — K580 Irritable bowel syndrome with diarrhea: Secondary | ICD-10-CM

## 2018-06-25 MED ORDER — ACETAMINOPHEN-CODEINE #3 300-30 MG PO TABS: ORAL_TABLET | ORAL | 0 refills | Status: DC

## 2018-06-25 NOTE — Telephone Encounter (Signed)
Done erx 

## 2018-06-25 NOTE — Telephone Encounter (Signed)
Medication: acetaminophen-codeine (TYLENOL #3) 300-30 MG tablet    Patient is requesting a refill of this medication. Patient is out and has been out for x3 days. Patient had requested refill from pharmacy but there is no documentation.    Pharmacy:  [facility] [place], [place] ([address] [phone] (Phone) [phone] (Fax)

## 2018-07-03 ENCOUNTER — Ambulatory Visit (HOSPITAL_COMMUNITY)
Admission: RE | Admit: 2018-07-03 | Discharge: 2018-07-03 | Disposition: A | Discharge status: Home / Self Care | Payer: BC Managed Care – PPO | Source: Ambulatory Visit | Attending: Family Medicine | Admitting: Family Medicine

## 2018-07-03 ENCOUNTER — Other Ambulatory Visit: Payer: Self-pay

## 2018-07-03 ENCOUNTER — Telehealth (HOSPITAL_COMMUNITY): Payer: Self-pay | Admitting: Neurology

## 2018-07-03 DIAGNOSIS — H539 Unspecified visual disturbance: Secondary | ICD-10-CM | POA: Diagnosis not present

## 2018-07-03 DIAGNOSIS — G43109 Migraine with aura, not intractable, without status migrainosus: Secondary | ICD-10-CM | POA: Diagnosis not present

## 2018-07-03 DIAGNOSIS — R404 Transient alteration of awareness: Secondary | ICD-10-CM | POA: Diagnosis not present

## 2018-07-03 DIAGNOSIS — I6200 Nontraumatic subdural hemorrhage, unspecified: Secondary | ICD-10-CM | POA: Diagnosis not present

## 2018-07-03 LAB — CREATININE, SERUM
Creatinine, Ser: 0.97 mg/dL (ref 0.44–1.00)
GFR calc Af Amer: >60 mL/min (ref >60)
GFR calc non Af Amer: >60 mL/min (ref >60)

## 2018-07-03 MED ORDER — GADOBUTROL 1 MMOL/ML IV SOLN
10.0000 mL | Freq: Once | INTRAVENOUS | Status: AC | PRN
  Administered 2018-07-03: 10 mL via INTRAVENOUS

## 2018-07-03 NOTE — Telephone Encounter (Signed)
I received a phone call from [facility] imaging about abnormal MRI scan for this patient showing small collection of subdural blood in the posterior fossa.  I called the patient and spoke to her.  She denied any headache nausea or vomiting and was doing well.  She had complaints of dizziness and some seizure-like episodes.  I advised her to discuss this further with Dr. Felecia Shelling and Debbora Presto nurse practitioner tomorrow.  In case she developed severe headache nausea vomiting or altered sensorium she was advised to get help right away.  She voiced understanding

## 2018-07-04 ENCOUNTER — Inpatient Hospital Stay (HOSPITAL_COMMUNITY)
Admission: EM | Admit: 2018-07-04 | Discharge: 2018-07-14 | DRG: 083 | Disposition: A | Discharge status: Home / Self Care | Payer: BC Managed Care – PPO | Attending: Family Medicine | Admitting: Family Medicine

## 2018-07-04 ENCOUNTER — Other Ambulatory Visit: Payer: Self-pay

## 2018-07-04 ENCOUNTER — Emergency Department (HOSPITAL_COMMUNITY): Payer: BC Managed Care – PPO

## 2018-07-04 ENCOUNTER — Telehealth: Payer: Self-pay

## 2018-07-04 ENCOUNTER — Encounter (HOSPITAL_COMMUNITY): Payer: Self-pay

## 2018-07-04 DIAGNOSIS — Z79890 Hormone replacement therapy (postmenopausal): Secondary | ICD-10-CM

## 2018-07-04 DIAGNOSIS — Z8249 Family history of ischemic heart disease and other diseases of the circulatory system: Secondary | ICD-10-CM

## 2018-07-04 DIAGNOSIS — Z952 Presence of prosthetic heart valve: Secondary | ICD-10-CM | POA: Diagnosis not present

## 2018-07-04 DIAGNOSIS — Z9013 Acquired absence of bilateral breasts and nipples: Secondary | ICD-10-CM | POA: Diagnosis not present

## 2018-07-04 DIAGNOSIS — Z90710 Acquired absence of both cervix and uterus: Secondary | ICD-10-CM

## 2018-07-04 DIAGNOSIS — E039 Hypothyroidism, unspecified: Secondary | ICD-10-CM | POA: Diagnosis not present

## 2018-07-04 DIAGNOSIS — Z86718 Personal history of other venous thrombosis and embolism: Secondary | ICD-10-CM

## 2018-07-04 DIAGNOSIS — Z1159 Encounter for screening for other viral diseases: Secondary | ICD-10-CM | POA: Diagnosis not present

## 2018-07-04 DIAGNOSIS — F329 Major depressive disorder, single episode, unspecified: Secondary | ICD-10-CM | POA: Diagnosis present

## 2018-07-04 DIAGNOSIS — Z8041 Family history of malignant neoplasm of ovary: Secondary | ICD-10-CM | POA: Diagnosis not present

## 2018-07-04 DIAGNOSIS — G9389 Other specified disorders of brain: Secondary | ICD-10-CM | POA: Diagnosis not present

## 2018-07-04 DIAGNOSIS — Z03818 Encounter for observation for suspected exposure to other biological agents ruled out: Secondary | ICD-10-CM | POA: Diagnosis not present

## 2018-07-04 DIAGNOSIS — E785 Hyperlipidemia, unspecified: Secondary | ICD-10-CM | POA: Diagnosis not present

## 2018-07-04 DIAGNOSIS — R569 Unspecified convulsions: Secondary | ICD-10-CM | POA: Diagnosis present

## 2018-07-04 DIAGNOSIS — S065XAA Traumatic subdural hemorrhage with loss of consciousness status unknown, initial encounter: Secondary | ICD-10-CM | POA: Diagnosis present

## 2018-07-04 DIAGNOSIS — T45515A Adverse effect of anticoagulants, initial encounter: Secondary | ICD-10-CM | POA: Diagnosis not present

## 2018-07-04 DIAGNOSIS — Z800 Family history of malignant neoplasm of digestive organs: Secondary | ICD-10-CM | POA: Diagnosis not present

## 2018-07-04 DIAGNOSIS — Z79899 Other long term (current) drug therapy: Secondary | ICD-10-CM

## 2018-07-04 DIAGNOSIS — Z6833 Body mass index (BMI) 33.0-33.9, adult: Secondary | ICD-10-CM | POA: Diagnosis not present

## 2018-07-04 DIAGNOSIS — Z7901 Long term (current) use of anticoagulants: Secondary | ICD-10-CM

## 2018-07-04 DIAGNOSIS — I10 Essential (primary) hypertension: Secondary | ICD-10-CM | POA: Diagnosis not present

## 2018-07-04 DIAGNOSIS — I422 Other hypertrophic cardiomyopathy: Secondary | ICD-10-CM | POA: Diagnosis not present

## 2018-07-04 DIAGNOSIS — E669 Obesity, unspecified: Secondary | ICD-10-CM | POA: Diagnosis not present

## 2018-07-04 DIAGNOSIS — I6200 Nontraumatic subdural hemorrhage, unspecified: Secondary | ICD-10-CM | POA: Diagnosis not present

## 2018-07-04 DIAGNOSIS — G43109 Migraine with aura, not intractable, without status migrainosus: Secondary | ICD-10-CM | POA: Diagnosis not present

## 2018-07-04 DIAGNOSIS — S065X0A Traumatic subdural hemorrhage without loss of consciousness, initial encounter: Secondary | ICD-10-CM | POA: Diagnosis not present

## 2018-07-04 DIAGNOSIS — S065X9A Traumatic subdural hemorrhage with loss of consciousness of unspecified duration, initial encounter: Secondary | ICD-10-CM | POA: Diagnosis not present

## 2018-07-04 HISTORY — DX: Cardiac murmur, unspecified: R01.1

## 2018-07-04 LAB — BASIC METABOLIC PANEL
Anion gap: 8 (ref 5–15)
BUN: 16 mg/dL (ref 8–23)
CO2: 21 mmol/L — ABNORMAL LOW (ref 22–32)
Calcium: 8.5 mg/dL — ABNORMAL LOW (ref 8.9–10.3)
Chloride: 110 mmol/L (ref 98–111)
Creatinine, Ser: 0.87 mg/dL (ref 0.44–1.00)
GFR calc Af Amer: >60 mL/min (ref >60)
GFR calc non Af Amer: >60 mL/min (ref >60)
Glucose, Bld: 99 mg/dL (ref 70–99)
Potassium: 3.6 mmol/L (ref 3.5–5.1)
Sodium: 139 mmol/L (ref 135–145)

## 2018-07-04 LAB — CBC WITH DIFFERENTIAL/PLATELET
Abs Immature Granulocytes: 0.01 10*3/uL (ref 0.00–0.07)
Basophils Absolute: 0 10*3/uL (ref 0.0–0.1)
Basophils Relative: 1 %
Eosinophils Absolute: 0.4 10*3/uL (ref 0.0–0.5)
Eosinophils Relative: 7 %
HCT: 35.8 % — ABNORMAL LOW (ref 36.0–46.0)
Hemoglobin: 12.1 g/dL (ref 12.0–15.0)
Immature Granulocytes: 0 %
Lymphocytes Relative: 28 %
Lymphs Abs: 1.6 10*3/uL (ref 0.7–4.0)
MCH: 27.9 pg (ref 26.0–34.0)
MCHC: 33.8 g/dL (ref 30.0–36.0)
MCV: 82.5 fL (ref 80.0–100.0)
Monocytes Absolute: 0.4 10*3/uL (ref 0.1–1.0)
Monocytes Relative: 7 %
Neutro Abs: 3.2 10*3/uL (ref 1.7–7.7)
Neutrophils Relative %: 57 %
Platelets: 235 10*3/uL (ref 150–400)
RBC: 4.34 MIL/uL (ref 3.87–5.11)
RDW: 14.6 % (ref 11.5–15.5)
WBC: 5.6 10*3/uL (ref 4.0–10.5)
nRBC: 0 % (ref 0.0–0.2)

## 2018-07-04 LAB — PROTIME-INR
INR: 2.7 — ABNORMAL HIGH (ref 0.8–1.2)
Prothrombin Time: 28.5 seconds — ABNORMAL HIGH (ref 11.4–15.2)

## 2018-07-04 MED ORDER — VENLAFAXINE HCL ER 75 MG PO CP24
150.0000 mg | ORAL_CAPSULE | Freq: Every day | ORAL | Status: DC
  Administered 2018-07-05 – 2018-07-14 (×10): 150 mg via ORAL
  Filled 2018-07-04 (×12): qty 2

## 2018-07-04 MED ORDER — ZOLPIDEM TARTRATE 5 MG PO TABS: 5.0000 mg | ORAL_TABLET | Freq: Every evening | ORAL | Status: DC | PRN

## 2018-07-04 MED ORDER — WARFARIN SODIUM 7.5 MG PO TABS
15.0000 mg | ORAL_TABLET | Freq: Once | ORAL | Status: AC
  Administered 2018-07-04: 15 mg via ORAL
  Filled 2018-07-04: qty 2

## 2018-07-04 MED ORDER — WARFARIN - PHARMACIST DOSING INPATIENT: Freq: Every day | Status: DC

## 2018-07-04 MED ORDER — LOSARTAN POTASSIUM 25 MG PO TABS
25.0000 mg | ORAL_TABLET | Freq: Every day | ORAL | Status: DC
  Administered 2018-07-05 – 2018-07-14 (×10): 25 mg via ORAL
  Filled 2018-07-04 (×11): qty 1

## 2018-07-04 MED ORDER — ACETAMINOPHEN 325 MG PO TABS: 650.0000 mg | ORAL_TABLET | Freq: Four times a day (QID) | ORAL | Status: DC | PRN

## 2018-07-04 MED ORDER — SODIUM CHLORIDE 0.9 % IV SOLN: 250.0000 mL | INTRAVENOUS | Status: DC | PRN

## 2018-07-04 MED ORDER — VITAMIN K1 10 MG/ML IJ SOLN
10.0000 mg | Freq: Once | INTRAVENOUS | Status: AC
  Administered 2018-07-04: 10 mg via INTRAVENOUS
  Filled 2018-07-04: qty 1

## 2018-07-04 MED ORDER — ONDANSETRON HCL 4 MG PO TABS
4.0000 mg | ORAL_TABLET | Freq: Four times a day (QID) | ORAL | Status: DC | PRN
  Administered 2018-07-08 – 2018-07-10 (×2): 4 mg via ORAL
  Filled 2018-07-04 (×2): qty 1

## 2018-07-04 MED ORDER — ZONISAMIDE 100 MG PO CAPS
200.0000 mg | ORAL_CAPSULE | Freq: Every day | ORAL | Status: DC
  Administered 2018-07-04 – 2018-07-05 (×2): 200 mg via ORAL
  Filled 2018-07-04 (×2): qty 2

## 2018-07-04 MED ORDER — SODIUM CHLORIDE 0.9% FLUSH
3.0000 mL | Freq: Two times a day (BID) | INTRAVENOUS | Status: DC
  Administered 2018-07-05 – 2018-07-11 (×5): 3 mL via INTRAVENOUS

## 2018-07-04 MED ORDER — SODIUM CHLORIDE 0.9% FLUSH
3.0000 mL | Freq: Two times a day (BID) | INTRAVENOUS | Status: DC
  Administered 2018-07-04 – 2018-07-13 (×10): 3 mL via INTRAVENOUS

## 2018-07-04 MED ORDER — SODIUM CHLORIDE 0.9% IV SOLUTION
Freq: Once | INTRAVENOUS | Status: AC
  Administered 2018-07-04: 21:00:00 via INTRAVENOUS

## 2018-07-04 MED ORDER — BISACODYL 5 MG PO TBEC: 5.0000 mg | DELAYED_RELEASE_TABLET | Freq: Every day | ORAL | Status: DC | PRN

## 2018-07-04 MED ORDER — METOPROLOL SUCCINATE ER 25 MG PO TB24
50.0000 mg | ORAL_TABLET | Freq: Every day | ORAL | Status: DC
  Administered 2018-07-05 – 2018-07-14 (×10): 50 mg via ORAL
  Filled 2018-07-04 (×11): qty 2

## 2018-07-04 MED ORDER — POLYETHYLENE GLYCOL 3350 17 G PO PACK: 17.0000 g | PACK | Freq: Every day | ORAL | Status: DC | PRN

## 2018-07-04 MED ORDER — ONDANSETRON HCL 4 MG/2ML IJ SOLN
4.0000 mg | Freq: Four times a day (QID) | INTRAMUSCULAR | Status: DC | PRN
  Administered 2018-07-06 – 2018-07-09 (×4): 4 mg via INTRAVENOUS
  Filled 2018-07-04 (×4): qty 2

## 2018-07-04 MED ORDER — HYDROCODONE-ACETAMINOPHEN 5-325 MG PO TABS: 1.0000 | ORAL_TABLET | ORAL | Status: DC | PRN

## 2018-07-04 MED ORDER — SODIUM CHLORIDE 0.9% FLUSH: 3.0000 mL | INTRAVENOUS | Status: DC | PRN

## 2018-07-04 MED ORDER — LEVOTHYROXINE SODIUM 50 MCG PO TABS
50.0000 ug | ORAL_TABLET | Freq: Every day | ORAL | Status: DC
  Administered 2018-07-05 – 2018-07-14 (×10): 50 ug via ORAL
  Filled 2018-07-04 (×10): qty 1

## 2018-07-04 MED ORDER — ACETAMINOPHEN 650 MG RE SUPP: 650.0000 mg | Freq: Four times a day (QID) | RECTAL | Status: DC | PRN

## 2018-07-04 NOTE — H&P (Addendum)
History and Physical    [name] XNA:[id number] DOB: 09-19-1955 DOA: 07/04/2018  PCP: Biagio Borg, MD   Patient coming from: Home   Chief Complaint: Dizziness, mild headache   HPI: [name] is a 63 y.o. 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  Patient denies any fall or trauma.  Several months ago, patient began to experience recurrent and brief episodes of visual disturbance and has been following with neurology for this.  More recently, she has developed mild frontal headache and episodes of dizziness and uncontrolled eye movements.  This was evaluated with outpatient MRI which revealed a small volume subdural hematoma in the posterior fossa at the ventral cisterna magna and in the upper cervical spine, as well as increased T1 signal in the globus pallidus.  She was directed to the ED for further evaluation of these findings.  Patient denies any recent fevers, chills, cough, shortness of breath, or sick contacts.  ED Course: Upon arrival to the ED, patient is found to be afebrile, saturating well on room air, and with remaining vitals also normal.  Chemistry panel and CBC are unremarkable, and INR is 2.7.  CT head re-demonstrates the small fluid collection.  Neurosurgery evaluated the patient, there is no indication for intervention at this time, she is not felt to need rapid reversal of her INR, but vitamin K and FFP were recommended.  Neurology is also consulted on the patient and recommending EEG, repeat head CT, seizure precautions, and neuro checks.  Review of Systems:  All other systems reviewed and apart from HPI, are negative.  Past Medical History:  Diagnosis  Date  . Abdominal pain, epigastric 10/25/2007  . ADD 01/20/2009  . ALLERGIC RHINITIS 10/15/2006  . ANGIOEDEMA 08/12/2008  . ANXIETY 10/15/2006  . ASTHMATIC BRONCHITIS, ACUTE 01/27/2008  . Cellulitis and abscess of leg, except foot 08/12/2007  . CELLULITIS, [place] 12/21/2008  . CHEST PAIN 06/14/2007  . Chronic anticoagulation 05/16/2016  . Chronic sinus infection 04/12/2010  . COLECTOMY, PARTIAL, WITH ANASTOMOSIS, HX OF 10/15/2006  . COLONIC POLYPS, HX OF 10/15/2006  . CYST, OVARIAN NEC/NOS 10/15/2006  . Dengue 02/22/2010  . DVT, HX OF 10/15/2006  . DYSAUTONOMIA 10/15/2006  . Dysfunction of eustachian tube 05/26/2009  . GI BLEEDING 04/04/2007  . Glossitis 03/06/2008  . HEADACHE, CHRONIC 04/04/2007  . Hematemesis 10/25/2007  . HEMORRHOIDS 04/04/2007  . HYPERLIPIDEMIA 12/12/2007  . HYPERTENSION 10/15/2006  . HYPOTHYROIDISM 03/06/2008  . IBS 04/04/2007  . MASTECTOMY, BILATERAL, HX OF 08/12/2009  . NECK MASS 07/01/2007  . NEOP, MALIGNANT, FEMALE BREAST NOS 10/15/2006  . 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 SYNCOPE 07/01/2007  . Wheezing 08/20/2008  . WOUND, OPEN, LEG, WITHOUT COMPLICATION 5/42/7062    Past Surgical History:  Procedure Laterality Date  . ABDOMINAL HYSTERECTOMY    . bilateral mastectomy/reconstruction    . CESAREAN SECTION    . CHOLECYSTECTOMY    . COLONOSCOPY W/ BIOPSIES    . FLEXIBLE SIGMOIDOSCOPY    . LEFT AND RIGHT HEART CATHETERIZATION WITH CORONARY ANGIOGRAM N/A 10/07/2012   Procedure: LEFT AND RIGHT HEART CATHETERIZATION WITH CORONARY ANGIOGRAM;  Surgeon: Jolaine Artist, MD;  Location: [facility] CATH LAB;  Service: Cardiovascular;  Laterality: N/A;  . multiple ablations    . OOPHORECTOMY    .  open heart surg    . spigellian hernia       reports that she has never smoked. She has never used smokeless tobacco. She reports that she does not drink alcohol or use drugs.  Allergies  Allergen Reactions  . Fish Allergy Swelling  . Latex Dermatitis    More than a day causes  blisters   . Levaquin [Levofloxacin In D5w]     n/v  . Penicillins     REACTION: shock    Family History  Problem Relation Age of Onset  . Heart disease Mother   . Cancer Sister        ovarian cancer  . Pancreatic cancer Father      Prior to Admission medications   Medication Sig Start Date End Date Taking? Authorizing Provider  acetaminophen-codeine (TYLENOL #3) 300-30 MG tablet 1 tab by mouth four times per day as needed Patient taking differently: Take 1 tablet by mouth 4 (four) times daily as needed for moderate pain.  06/25/18   Biagio Borg, MD  cetirizine (ZYRTEC) 10 MG tablet Take 1 tablet (10 mg total) by mouth daily. 11/23/17 11/23/18  Biagio Borg, MD  fluticasone (FLONASE) 50 MCG/ACT nasal spray SHAKE LIQUID AND USE 2 SPRAYS IN EACH NOSTRIL DAILY Patient taking differently: Place 2 sprays into both nostrils daily.  01/05/17   Biagio Borg, MD  levothyroxine (SYNTHROID, LEVOTHROID) 50 MCG tablet TAKE ONE TABLET EVERY DAY Patient taking differently: Take 50 mcg by mouth daily before breakfast.  04/15/18   Biagio Borg, MD  losartan (COZAAR) 25 MG tablet Take 25 mg by mouth daily.    [provider]  metoprolol succinate (TOPROL-XL) 50 MG 24 hr tablet Take 1 tablet (50 mg total) by mouth daily. Take 1 by mouth daily Patient taking differently: Take 50 mg by mouth daily.  08/06/15   Biagio Borg, MD  triamcinolone (NASACORT) 55 MCG/ACT AERO nasal inhaler Place 2 sprays into the nose daily. 11/23/17   Biagio Borg, MD  venlafaxine XR (EFFEXOR-XR) 150 MG 24 hr capsule TAKE ONE CAPSULE DAILY 08/01/17   Biagio Borg, MD  warfarin (COUMADIN) 10 MG tablet Take 1 tablet daily or as directed by anticoagulation clinic 05/31/18   Biagio Borg, MD  zolpidem (AMBIEN) 10 MG tablet TAKE 1 TABLET BY MOUTH EVERY DAY AT BEDTIME AS NEEDED Patient taking differently: Take 10 mg by mouth at bedtime as needed for sleep.  01/18/15   Biagio Borg, MD  zonisamide (ZONEGRAN) 100 MG capsule  Take 2 capsules (200 mg total) by mouth at bedtime. 03/05/18   Dennie Bible, NP    Physical Exam: Vitals:   07/04/18 1630 07/04/18 1645 07/04/18 1700 07/04/18 1715  BP: 122/78 117/61 (!) 115/59 114/61  Pulse: 69 68 69 68  Resp:      Temp:      TempSrc:      SpO2: 95% 97% 98% 96%    Constitutional: NAD, calm  Eyes: PERTLA, lids and conjunctivae normal ENMT: Mucous membranes are moist. Posterior pharynx clear of any exudate or lesions.   Neck: normal, supple, no masses, no thyromegaly Respiratory: clear to auscultation bilaterally, no wheezing, no crackles. Normal respiratory effort.    Cardiovascular: S1 & S2 heard, regular rate and rhythm. No extremity edema.   Abdomen: No distension, no tenderness, soft. Bowel sounds active.  Musculoskeletal: no clubbing / cyanosis. No joint deformity upper and lower extremities.    Skin: no significant rashes,  lesions, ulcers. Warm, dry, well-perfused. Neurologic: No facial asymmetry. Horizontal nystagmus. Sensation to light touch intact. Strength 5/5 in all 4 limbs.  Psychiatric: Alert and oriented x 3. Very pleasant, cooperatiave.    Labs on Admission: I have personally reviewed following labs and imaging studies  CBC: Recent Labs  Lab 07/04/18 1838  WBC 5.6  NEUTROABS 3.2  HGB 12.1  HCT 35.8*  MCV 82.5  PLT 161   Basic Metabolic Panel: Recent Labs  Lab 07/03/18 1330 07/04/18 1838  NA  --  139  K  --  3.6  CL  --  110  CO2  --  21*  GLUCOSE  --  99  BUN  --  16  CREATININE 0.97 0.87  CALCIUM  --  8.5*   GFR: CrCl cannot be calculated (Unknown ideal weight.). Liver Function Tests: No results for input(s): AST, ALT, ALKPHOS, BILITOT, PROT, ALBUMIN in the last 168 hours. No results for input(s): LIPASE, AMYLASE in the last 168 hours. No results for input(s): AMMONIA in the last 168 hours. Coagulation Profile: Recent Labs  Lab 07/04/18 1838  INR 2.7*   Cardiac Enzymes: No results for input(s): CKTOTAL, CKMB,  CKMBINDEX, TROPONINI in the last 168 hours. BNP (last 3 results) No results for input(s): PROBNP in the last 8760 hours. HbA1C: No results for input(s): HGBA1C in the last 72 hours. CBG: No results for input(s): GLUCAP in the last 168 hours. Lipid Profile: No results for input(s): CHOL, HDL, LDLCALC, TRIG, CHOLHDL, LDLDIRECT in the last 72 hours. Thyroid Function Tests: No results for input(s): TSH, T4TOTAL, FREET4, T3FREE, THYROIDAB in the last 72 hours. Anemia Panel: No results for input(s): VITAMINB12, FOLATE, FERRITIN, TIBC, IRON, RETICCTPCT in the last 72 hours. Urine analysis:    Component Value Date/Time   COLORURINE LT. YELLOW 12/13/2011 [place] 12/13/2011 1244   LABSPEC 1.010 12/13/2011 1244   PHURINE 6.0 12/13/2011 1244   GLUCOSEU NEGATIVE 12/13/2011 1244   HGBUR NEGATIVE 12/13/2011 1244   BILIRUBINUR NEGATIVE 12/13/2011 [place] 12/13/2011 1244   PROTEINUR NEGATIVE 08/13/2007 0537   UROBILINOGEN 0.2 12/13/2011 1244   NITRITE NEGATIVE 12/13/2011 1244   LEUKOCYTESUR NEGATIVE 12/13/2011 1244   Sepsis Labs: @LABRCNTIP (procalcitonin:4,lacticidven:4) )No results found for this or any previous visit (from the past 240 hour(s)).   Radiological Exams on Admission: Ct Head Wo Contrast  Result Date: 07/04/2018 CLINICAL DATA:  63 year old female found to have small volume posterior fossa subdural hematoma on routine outpatient MRI yesterday done for seizure like activity. EXAM: CT HEAD WITHOUT CONTRAST TECHNIQUE: Contiguous axial images were obtained from the base of the skull through the vertex without intravenous contrast. COMPARISON:  MRI 07/03/2018.  Head CT 09/13/2016. FINDINGS: Brain: Subtle CT evidence of the small volume posterior fossa hematoma seen by MRI yesterday (sagittal image 37). And as expected this is new from the 2018 CT. The blood is intermediate density. No associated posterior fossa mass effect. No other intracranial  hemorrhage identified. Gray-white matter differentiation is within normal limits throughout the brain. No ventriculomegaly. No cortically based acute infarct identified. Vascular: No suspicious intracranial vascular hyperdensity. Skull: Stable and intact.  No acute osseous abnormality identified. Sinuses/Orbits: Visualized paranasal sinuses and mastoids are stable and well pneumatized. Other: Visualized orbits and scalp soft tissues are within normal limits. IMPRESSION: 1. Re-demonstrated small volume posterior fossa subdural hematoma (trace by CT) as seen by MRI yesterday. This is intermediate density, and there is no associated mass effect. 2. No skull fracture  identified and otherwise negative noncontrast CT appearance of the brain. Electronically Signed   By: Genevie Ann M.D.   On: 07/04/2018 18:07   Mr Jeri Cos ZO Contrast  Addendum Date: 07/03/2018   ADDENDUM REPORT: 07/03/2018 21:06 ADDENDUM: Study discussed by telephone with Dr. Antony Contras (covering for Provider AMY Dunlap ) on 07/03/2018 at 2053 hours. Electronically Signed   By: Genevie Ann M.D.   On: 07/03/2018 21:06   Result Date: 07/03/2018 CLINICAL DATA:  63 year old female with seizure-like activity. The patient has an MRI compatible pacemaker, with recommended conditions followed and no adverse events. EXAM: MRI HEAD WITHOUT AND WITH CONTRAST TECHNIQUE: Multiplanar, multiecho pulse sequences of the brain and surrounding structures were obtained without and with intravenous contrast. CONTRAST:  10 milliliters Gadavist COMPARISON:  Head CT 09/13/2016 and earlier. FINDINGS: Brain: There is a small volume of subdural hemorrhage in the posterior fossa along the undersurface of the cerebellum (series 9, image 16 on the left and image 7 on the right) which appears new since 2018. Superimposed similar small volume of subdural blood along the ventral cisterna magna and in the upper cervical spine on series 9, image 11. The blood products at each of the sites  measures 3-5 millimeters in thickness there is no significant mass effect. Otherwise there are occasional chronic micro hemorrhages in the brain (series 14, image 29), but no other acute intracranial hemorrhage. No superimposed restricted diffusion to suggest acute infarction. No midline shift, mass effect, ventriculomegaly. No intraventricular blood or debris. There is abnormal intrinsic T1 hyperintensity in the bilateral globus pallidus, most apparent on sagittal T1 (series 9, image 9). Otherwise gray and white matter signal is largely normal for age throughout the brain, no cortical encephalomalacia identified. The deep gray matter nuclei and brainstem appear normal. Thin slice imaging of the hippocampal formations appears symmetric and within normal limits. No abnormal enhancement identified. No dural thickening identified. Pituitary within normal limits. Vascular: Major intracranial vascular flow voids are preserved. There is mild generalized intracranial artery tortuosity. The left vertebral artery appears mildly dominant. The major dural venous sinuses are enhancing and appear to be patent. Skull and upper cervical spine: Aside from the subdural hemorrhage dorsal to the C2 vertebra (series 9, image 11), the visible upper cervical spine appears normal. Visualized bone marrow signal is within normal limits. Sinuses/Orbits: Negative orbits.  Paranasal sinuses are clear. Other: Mastoids are clear. Visible internal auditory structures appear normal. Scalp and face soft tissues appear negative. IMPRESSION: 1. Unexpected finding of small volume subdural hematoma in the posterior fossa, at the ventral cisterna magna, and in the upper cervical spine (series 9, image 11). Query recent trauma and/or coagulopathy. There is no significant mass effect. 2. Intrinsic increased T1 signal in the globus pallidus which can be seen in the setting of hepatic insufficiency, cirrhosis, portosystemic shunt, portal vein thrombosis,  total parenteral nutrition, and is less often seen with hyperglycemia or other metabolic disturbances such as Wilson's disease. Correlation with serum ammonia levels and liver function tests is recommended. 3. Otherwise largely normal for age brain parenchyma; there are occasional chronic micro hemorrhages. Electronically Signed: By: Genevie Ann M.D. On: 07/03/2018 20:12    EKG: Not performed.   Assessment/Plan   1. Subdural fluid collection    - Presents with headaches and seizure-like activity, evaluated with outpatient MRI on 07/03/18 that revealed a small-volume subdural fluid collection in posterior fossa and upper c-spine  - Neurosurgery has evaluated her in ED, no need for intervention or  rapid INR reversal, but recommended vit k and FFP  - Continue neuro checks, repeat head CT in am, repeat INR after FFP and vit K     Addendum: Discussed with neurology who is not convinced that the subdural collection is actually blood and is concerned about the high-risk of CVA with reversing her INR. Neurology recommends continuing anticoagulation based on risk of CVA w/o AC vs worsening of the possible SDH w/ AC.    2. Seizure-like activity  - Appreciate neurology consultation, will check EEG, continue Zonegran    3. Mitral valve replacement  - INR 2.7 on admission, treated with FFP and vitamin K per neurosurgery recs, will repeat INR in am - She follows with [place] [facility] and will need to discuss anticoagulation in light of spontaneous SDH    4. Hypertension  - BP at goal, continue losartan and metoprolol    5. Depression  - Continue Effexor    6. Hypothyroidism  - Continue Synthroid    PPE: Mask, face shield. Patient wearing mask.  DVT prophylaxis: SCD's, coumadin pta  Code Status: Full  Family Communication: Discussed with patient  Consults called: NSG and neurology consulted by ED physician  Admission status: Observation     Vianne Bulls, MD [facility] Pager [phone]   If 7PM-7AM, please contact night-coverage [email or web address] Password [facility]  07/04/2018, 7:51 PM

## 2018-07-04 NOTE — ED Notes (Signed)
Patient transported to CT 

## 2018-07-04 NOTE — ED Notes (Signed)
Two unsuccessful IV attempts.

## 2018-07-04 NOTE — ED Provider Notes (Signed)
[facility] EMERGENCY DEPARTMENT Provider Note   CSN: [id number] Arrival date & time: 07/04/18  1535    History   Chief Complaint Chief Complaint  Patient presents with   subdural/ from [facility]    HPI [name] is a 63 y.o. female.     Patient is a 63 year old female who presents with a subdural hematoma.  She is followed by [facility].  She has had frequent spells where she will have pain behind her eyes associated with one eye going up in one eye going down.  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  During the spells she is unable to talk and has to sit down.  They last about 10 to 20 minutes.  She has been having them since the fall.  She had an MRI yesterday which showed an unexpected subdural hematoma.  Of note she is on Coumadin for prior DVT.  She last took her Coumadin which was 10 mg last night around 10 PM.  She does state that she has had some dizziness for the last week or so.  She describes it as associated with nausea.  She says it is both when she stands up and when she sitting down.  She does not have a spinning sensation.  She denies any significant headache.  No numbness or weakness in her extremities.  No speech deficits or vision changes.     Past Medical History:  Diagnosis Date   Abdominal pain, epigastric 10/25/2007   ADD 01/20/2009   ALLERGIC RHINITIS 10/15/2006   ANGIOEDEMA 08/12/2008   ANXIETY 10/15/2006   ASTHMATIC BRONCHITIS, ACUTE 01/27/2008   Cellulitis and abscess of leg, except foot 08/12/2007   CELLULITIS, FACE 12/21/2008   CHEST PAIN 06/14/2007   Chronic anticoagulation 05/16/2016   Chronic sinus infection 04/12/2010   COLECTOMY, PARTIAL, WITH ANASTOMOSIS, HX OF 10/15/2006   COLONIC POLYPS, HX OF 10/15/2006   CYST, OVARIAN NEC/NOS 10/15/2006   Dengue 02/22/2010   DVT, HX OF 10/15/2006   DYSAUTONOMIA 10/15/2006   Dysfunction of eustachian tube 05/26/2009   GI BLEEDING 04/04/2007    Glossitis 03/06/2008   HEADACHE, CHRONIC 04/04/2007   Hematemesis 10/25/2007   HEMORRHOIDS 04/04/2007   HYPERLIPIDEMIA 12/12/2007   HYPERTENSION 10/15/2006   HYPOTHYROIDISM 03/06/2008   IBS 04/04/2007   MASTECTOMY, BILATERAL, HX OF 08/12/2009   NECK MASS 07/01/2007   NEOP, MALIGNANT, FEMALE BREAST NOS 10/15/2006   NEOP, MALIGNANT, 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   SYNCOPE 07/01/2007   Wheezing 08/20/2008   WOUND, OPEN, LEG, WITHOUT COMPLICATION 01/29/5168    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   Dysfunction of Eustachian tube, bilateral 11/24/2017   Diarrhea 08/14/2017   Fever 08/14/2017   Whole body pain 08/14/2017   Migraine equivalent syndrome 05/31/2017   Ocular motility disturbance 05/31/2017   Bilateral otitis media with effusion 05/29/2017   Cough 02/22/2017   Wheezing 02/22/2017   Nosebleed 08/29/2016   Headache 08/29/2016   Chronic anticoagulation 05/16/2016   Encounter for therapeutic drug monitoring 05/16/2016   S/P mitral valve replacement 05/16/2016   Dysuria 05/21/2015   Other hypertrophic cardiomyopathy ([place]) 03/11/2015   Syncope 08/26/2014   Wellness examination 07/08/2014   Change in vision 01/21/2014   Acute upper respiratory infection 01/14/2014   Ear lesion 08/24/2013   Right ear pain 04/10/2013   Left otitis media 12/25/2012   Chest pain on exertion 10/02/2012   Dyspnea on exertion 06/06/2012  Other fatigue 04/05/2012   Dizzy 02/07/2012   Fatigue 06/01/2010   Chronic sinus infection 04/12/2010   Dengue 02/22/2010   MASTECTOMY, BILATERAL, HX OF 08/12/2009   Dysfunction of eustachian tube 05/26/2009   ADD 01/20/2009   HYPOTHYROIDISM 03/06/2008   VITAMIN D DEFICIENCY 03/06/2008   RASH-NONVESICULAR 02/13/2008   HYPERLIPIDEMIA 12/12/2007   Hematemesis 10/25/2007   IBS 04/04/2007   RECTAL BLEEDING 04/04/2007   GI BLEEDING  04/04/2007   SHY-DRAGER SYNDROME 01/17/2007   VERTIGO 01/17/2007   NEOP, MALIGNANT, 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   NEOP, MALIGNANT, FEMALE BREAST NOS 10/15/2006   Anxiety state 10/15/2006   Depression 10/15/2006   Essential hypertension 10/15/2006   Allergic rhinitis 10/15/2006   CYST, OVARIAN NEC/NOS 10/15/2006   DYSAUTONOMIA 10/15/2006   DVT, HX OF 10/15/2006   COLONIC POLYPS, HX OF 10/15/2006   Personal History of Other Diseases of Digestive Disease 10/15/2006   COLECTOMY, PARTIAL, WITH ANASTOMOSIS, HX OF 10/15/2006    Past Surgical History:  Procedure Laterality Date   ABDOMINAL HYSTERECTOMY     bilateral mastectomy/reconstruction     CESAREAN SECTION     CHOLECYSTECTOMY     COLONOSCOPY W/ BIOPSIES     FLEXIBLE SIGMOIDOSCOPY     LEFT AND RIGHT HEART CATHETERIZATION WITH CORONARY ANGIOGRAM N/A 10/07/2012   Procedure: LEFT AND RIGHT HEART CATHETERIZATION WITH CORONARY ANGIOGRAM;  Surgeon: Jolaine Artist, MD;  Location: [facility] CATH LAB;  Service: Cardiovascular;  Laterality: N/A;   multiple ablations     OOPHORECTOMY     open heart surg     spigellian hernia       OB History    Gravida  10   Para  3   Term      Preterm      AB  7   Living  3     SAB  7   TAB      Ectopic      Multiple      Live Births               Home Medications    Prior to Admission medications   Medication Sig Start Date End Date Taking? Authorizing Provider  acetaminophen-codeine (TYLENOL #3) 300-30 MG tablet 1 tab by mouth four times per day as needed Patient taking differently: Take 1 tablet by mouth 4 (four) times daily as needed for moderate pain.  06/25/18   Biagio Borg, MD  cetirizine (ZYRTEC) 10 MG tablet Take 1 tablet (10 mg total) by mouth daily. 11/23/17 11/23/18  Biagio Borg, MD  fluticasone (FLONASE) 50 MCG/ACT nasal spray SHAKE LIQUID AND USE 2 SPRAYS IN EACH NOSTRIL DAILY Patient taking differently: Place 2 sprays into both nostrils daily.   01/05/17   Biagio Borg, MD  levothyroxine (SYNTHROID, LEVOTHROID) 50 MCG tablet TAKE ONE TABLET EVERY DAY Patient taking differently: Take 50 mcg by mouth daily before breakfast.  04/15/18   Biagio Borg, MD  losartan (COZAAR) 25 MG tablet Take 25 mg by mouth daily.    [provider]  metoprolol succinate (TOPROL-XL) 50 MG 24 hr tablet Take 1 tablet (50 mg total) by mouth daily. Take 1 by mouth daily Patient taking differently: Take 50 mg by mouth daily.  08/06/15   Biagio Borg, MD  triamcinolone (NASACORT) 55 MCG/ACT AERO nasal inhaler Place 2 sprays into the nose daily. 11/23/17   Biagio Borg, MD  venlafaxine XR (EFFEXOR-XR) 150 MG 24 hr capsule TAKE ONE CAPSULE DAILY 08/01/17  Biagio Borg, MD  warfarin (COUMADIN) 10 MG tablet Take 1 tablet daily or as directed by anticoagulation clinic 05/31/18   Biagio Borg, MD  zolpidem (AMBIEN) 10 MG tablet TAKE 1 TABLET BY MOUTH EVERY DAY AT BEDTIME AS NEEDED Patient taking differently: Take 10 mg by mouth at bedtime as needed for sleep.  01/18/15   Biagio Borg, MD  zonisamide (ZONEGRAN) 100 MG capsule Take 2 capsules (200 mg total) by mouth at bedtime. 03/05/18   Dennie Bible, NP    Family History Family History  Problem Relation Age of Onset   Heart disease Mother    Cancer Sister        ovarian cancer   Pancreatic cancer Father     Social History Social History   Tobacco Use   Smoking status: Never Smoker   Smokeless tobacco: Never Used  Substance Use Topics   Alcohol use: No   Drug use: No     Allergies   Fish allergy, Latex, Levaquin [levofloxacin in d5w], and Penicillins   Review of Systems Review of Systems  Constitutional: Negative for chills, diaphoresis, fatigue and fever.  HENT: Negative for congestion, rhinorrhea and sneezing.   Eyes: Negative.   Respiratory: Negative for cough, chest tightness and shortness of breath.   Cardiovascular: Negative for chest pain and leg swelling.    Gastrointestinal: Positive for nausea. Negative for abdominal pain, blood in stool, diarrhea and vomiting.  Genitourinary: Negative for difficulty urinating, flank pain, frequency and hematuria.  Musculoskeletal: Negative for arthralgias and back pain.  Skin: Negative for rash.  Neurological: Positive for dizziness. Negative for speech difficulty, weakness, numbness and headaches.     Physical Exam Updated Vital Signs BP 114/61    Pulse 68    Temp 98.3 F (36.8 C) (Oral)    Resp 16    SpO2 96%   Physical Exam Constitutional:      Appearance: She is well-developed.  HENT:     Head: Normocephalic and atraumatic.  Eyes:     Extraocular Movements: Extraocular movements intact.     Pupils: Pupils are equal, round, and reactive to light.     Comments: Positive nystagmus with a fast component to the left  Neck:     Musculoskeletal: Normal range of motion and neck supple.  Cardiovascular:     Rate and Rhythm: Normal rate and regular rhythm.     Heart sounds: Normal heart sounds.  Pulmonary:     Effort: Pulmonary effort is normal. No respiratory distress.     Breath sounds: Normal breath sounds. No wheezing or rales.  Chest:     Chest wall: No tenderness.  Abdominal:     General: Bowel sounds are normal.     Palpations: Abdomen is soft.     Tenderness: There is no abdominal tenderness. There is no guarding or rebound.  Musculoskeletal: Normal range of motion.  Lymphadenopathy:     Cervical: No cervical adenopathy.  Skin:    General: Skin is warm and dry.     Findings: No rash.  Neurological:     Mental Status: She is alert and oriented to person, place, and time.     Comments: Motor 5/5 all extremities Sensation grossly intact to LT all extremities Finger to Nose intact, no pronator drift CN II-XII grossly intact        ED Treatments / Results  Labs (all labs ordered are listed, but only abnormal results are displayed) Labs Reviewed  BASIC METABOLIC PANEL -  Abnormal; Notable for the following components:      Result Value   CO2 21 (*)    Calcium 8.5 (*)    All other components within normal limits  CBC WITH DIFFERENTIAL/PLATELET - Abnormal; Notable for the following components:   HCT 35.8 (*)    All other components within normal limits  PROTIME-INR - Abnormal; Notable for the following components:   Prothrombin Time 28.5 (*)    INR 2.7 (*)    All other components within normal limits    EKG None  Radiology Ct Head Wo Contrast  Result Date: 07/04/2018 CLINICAL DATA:  63 year old female found to have small volume posterior fossa subdural hematoma on routine outpatient MRI yesterday done for seizure like activity. EXAM: CT HEAD WITHOUT CONTRAST TECHNIQUE: Contiguous axial images were obtained from the base of the skull through the vertex without intravenous contrast. COMPARISON:  MRI 07/03/2018.  Head CT 09/13/2016. FINDINGS: Brain: Subtle CT evidence of the small volume posterior fossa hematoma seen by MRI yesterday (sagittal image 37). And as expected this is new from the 2018 CT. The blood is intermediate density. No associated posterior fossa mass effect. No other intracranial hemorrhage identified. Gray-white matter differentiation is within normal limits throughout the brain. No ventriculomegaly. No cortically based acute infarct identified. Vascular: No suspicious intracranial vascular hyperdensity. Skull: Stable and intact.  No acute osseous abnormality identified. Sinuses/Orbits: Visualized paranasal sinuses and mastoids are stable and well pneumatized. Other: Visualized orbits and scalp soft tissues are within normal limits. IMPRESSION: 1. Re-demonstrated small volume posterior fossa subdural hematoma (trace by CT) as seen by MRI yesterday. This is intermediate density, and there is no associated mass effect. 2. No skull fracture identified and otherwise negative noncontrast CT appearance of the brain. Electronically Signed   By: Genevie Ann M.D.    On: 07/04/2018 18:07   Mr Jeri Cos WU Contrast  Addendum Date: 07/03/2018   ADDENDUM REPORT: 07/03/2018 21:06 ADDENDUM: Study discussed by telephone with Dr. Antony Contras (covering for Provider AMY Hughes ) on 07/03/2018 at 2053 hours. Electronically Signed   By: Genevie Ann M.D.   On: 07/03/2018 21:06   Result Date: 07/03/2018 CLINICAL DATA:  63 year old female with seizure-like activity. The patient has an MRI compatible pacemaker, with recommended conditions followed and no adverse events. EXAM: MRI HEAD WITHOUT AND WITH CONTRAST TECHNIQUE: Multiplanar, multiecho pulse sequences of the brain and surrounding structures were obtained without and with intravenous contrast. CONTRAST:  10 milliliters Gadavist COMPARISON:  Head CT 09/13/2016 and earlier. FINDINGS: Brain: There is a small volume of subdural hemorrhage in the posterior fossa along the undersurface of the cerebellum (series 9, image 16 on the left and image 7 on the right) which appears new since 2018. Superimposed similar small volume of subdural blood along the ventral cisterna magna and in the upper cervical spine on series 9, image 11. The blood products at each of the sites measures 3-5 millimeters in thickness there is no significant mass effect. Otherwise there are occasional chronic micro hemorrhages in the brain (series 14, image 29), but no other acute intracranial hemorrhage. No superimposed restricted diffusion to suggest acute infarction. No midline shift, mass effect, ventriculomegaly. No intraventricular blood or debris. There is abnormal intrinsic T1 hyperintensity in the bilateral globus pallidus, most apparent on sagittal T1 (series 9, image 9). Otherwise gray and white matter signal is largely normal for age throughout the brain, no cortical encephalomalacia identified. The deep gray matter nuclei and brainstem appear normal. Thin slice  imaging of the hippocampal formations appears symmetric and within normal limits. No abnormal  enhancement identified. No dural thickening identified. Pituitary within normal limits. Vascular: Major intracranial vascular flow voids are preserved. There is mild generalized intracranial artery tortuosity. The left vertebral artery appears mildly dominant. The major dural venous sinuses are enhancing and appear to be patent. Skull and upper cervical spine: Aside from the subdural hemorrhage dorsal to the C2 vertebra (series 9, image 11), the visible upper cervical spine appears normal. Visualized bone marrow signal is within normal limits. Sinuses/Orbits: Negative orbits.  Paranasal sinuses are clear. Other: Mastoids are clear. Visible internal auditory structures appear normal. Scalp and face soft tissues appear negative. IMPRESSION: 1. Unexpected finding of small volume subdural hematoma in the posterior fossa, at the ventral cisterna magna, and in the upper cervical spine (series 9, image 11). Query recent trauma and/or coagulopathy. There is no significant mass effect. 2. Intrinsic increased T1 signal in the globus pallidus which can be seen in the setting of hepatic insufficiency, cirrhosis, portosystemic shunt, portal vein thrombosis, total parenteral nutrition, and is less often seen with hyperglycemia or other metabolic disturbances such as Wilson's disease. Correlation with serum ammonia levels and liver function tests is recommended. 3. Otherwise largely normal for age brain parenchyma; there are occasional chronic micro hemorrhages. Electronically Signed: By: Genevie Ann M.D. On: 07/03/2018 20:12    Procedures Procedures (including critical care time)  Medications Ordered in ED Medications  phytonadione (VITAMIN K) 10 mg in dextrose 5 % 50 mL IVPB (has no administration in time range)     Initial Impression / Assessment and Plan / ED Course  I have reviewed the triage vital signs and the nursing notes.  Pertinent labs & imaging results that were available during my care of the patient were  reviewed by me and considered in my medical decision making (see chart for details).        Patient is a 63 year old female who presents with a subdural hematoma on MRI.  She has mild symptoms currently but no worsening headache.  No neurologic deficits.  Repeat head CT shows no change from yesterday.  Neurosurgery has consulted on the patient and recommend to give vitamin K if her INR is greater than 1.5 which it is.  She was given vitamin K.  They do not recommend other rapid reversal agents.  Neurology has also consult on the patient given her ongoing ocular migraine type symptoms and questionable seizures.  They will follow along.  I will consult the hospitalist for admission.  CRITICAL CARE Performed by: Malvin Johns Total critical care time: 45 minutes Critical care time was exclusive of separately billable procedures and treating other patients. Critical care was necessary to treat or prevent imminent or life-threatening deterioration. Critical care was time spent personally by me on the following activities: development of treatment plan with patient and/or surrogate as well as nursing, discussions with consultants, evaluation of patient's response to treatment, examination of patient, obtaining history from patient or surrogate, ordering and performing treatments and interventions, ordering and review of laboratory studies, ordering and review of radiographic studies, pulse oximetry and re-evaluation of patient's condition.   Final Clinical Impressions(s) / ED Diagnoses   Final diagnoses:  Subdural hematoma [facility])    ED Discharge Orders    None       Malvin Johns, MD 07/04/18 1925

## 2018-07-04 NOTE — Telephone Encounter (Signed)
Agree with comments/plan by Dr. Leonie Man and Debbora Presto, NP

## 2018-07-04 NOTE — Telephone Encounter (Signed)
I have called [name] to discuss MRI results. She reports that she has been extremely dizzy for the past week and has noted two "really bad" events where her eyes were moving abnormally and she was unable to talk or walk. These events were not witnessed. She denies headaches, falls, hitting of head or other injuries. She is currently taking Coumadin 10mg  daily s/p mitral valve replacement. Last INR was 3 about 1 month ago. She denies unusual bleeding or bruising. She has not had recent labs performed and denies known liver disease. Due to concerning symptoms and noted subdural on MRI, I have advised [name] to go to [facility] for closer evaluation. I have consulted with Dr Leonie Man who agrees with this plan. I have called her PCP, Dr [name], as well as neuro hospitalist. We will follow up closely following ER evaluation.

## 2018-07-04 NOTE — ED Notes (Signed)
Requested assistance from phlebotomy with labs.

## 2018-07-04 NOTE — Progress Notes (Signed)
ANTICOAGULATION CONSULT NOTE - Initial Consult  Pharmacy Consult for warfarin Indication: mechanical valve  Allergies  Allergen Reactions  . Fish Allergy Anaphylaxis and Swelling    Scaled fish, like flounder  . Penicillins Anaphylaxis and Hives    Did it involve swelling of the face/tongue/throat, SOB, or low BP? Yes Did it involve sudden or severe rash/hives, skin peeling, or any reaction on the inside of your mouth or nose? Unk Did you need to seek medical attention at a hospital or doctor's office? No When did it last happen? "I was 63 years old" If all above answers are "NO", may proceed with cephalosporin use.   [name] [Levofloxacin In D5w] Nausea And Vomiting  . Latex Dermatitis and Rash    If worn for more than a day, this causes blisters     Patient Measurements:    Vital Signs: Temp: 98 F (36.7 C) (07/02 2109) Temp Source: Oral (07/02 2109) BP: 149/95 (07/02 2109) Pulse Rate: 72 (07/02 2109)  Labs: Recent Labs    07/03/18 1330 07/04/18 1838  HGB  --  12.1  HCT  --  35.8*  PLT  --  235  LABPROT  --  28.5*  INR  --  2.7*  CREATININE 0.97 0.87    CrCl cannot be calculated (Unknown ideal weight.).   Medical History: Past Medical History:  Diagnosis Date  . Abdominal pain, epigastric 10/25/2007  . ADD 01/20/2009  . ALLERGIC RHINITIS 10/15/2006  . ANGIOEDEMA 08/12/2008  . ANXIETY 10/15/2006  . ASTHMATIC BRONCHITIS, ACUTE 01/27/2008  . Cellulitis and abscess of leg, except foot 08/12/2007  . CELLULITIS, [place] 12/21/2008  . CHEST PAIN 06/14/2007  . Chronic anticoagulation 05/16/2016  . Chronic sinus infection 04/12/2010  . COLECTOMY, PARTIAL, WITH ANASTOMOSIS, HX OF 10/15/2006  . COLONIC POLYPS, HX OF 10/15/2006  . CYST, OVARIAN NEC/NOS 10/15/2006  . Dengue 02/22/2010  . DVT, HX OF 10/15/2006  . DYSAUTONOMIA 10/15/2006  . Dysfunction of eustachian tube 05/26/2009  . GI BLEEDING 04/04/2007  . Glossitis 03/06/2008  . HEADACHE, CHRONIC 04/04/2007  .  Hematemesis 10/25/2007  . HEMORRHOIDS 04/04/2007  . HYPERLIPIDEMIA 12/12/2007  . HYPERTENSION 10/15/2006  . HYPOTHYROIDISM 03/06/2008  . IBS 04/04/2007  . MASTECTOMY, BILATERAL, HX OF 08/12/2009  . NECK MASS 07/01/2007  . NEOP, MALIGNANT, FEMALE BREAST NOS 10/15/2006  . 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 SYNCOPE 07/01/2007  . Wheezing 08/20/2008  . WOUND, OPEN, LEG, WITHOUT COMPLICATION 09/16/7827   Assessment: 63 yof presented to the ED with dizziness and a mild headache. Initially  thought to have an SDH but per discussions with neurology, unclear if this is truly an SDH. She is on chronic warfarin for a history of a mechanical valve. INR is therapeutic today at 2.7. MD determined that the risk of stroke with reversing and holding her warfarin was high so warfarin will be resumed. However, patient already received vitamin K 10mg  IV x 1 prior to deciding to resume her warfarin. This is likely going to complicate her warfarin dosing and she may require an IV heparin bridge.   Goal of Therapy:  INR 2.5-3.5 Monitor platelets by anticoagulation protocol: Yes   Plan:  Warfarin 15mg  PO x 1 now Recheck INR at midnight - anticipate INR to drop from vitamin K, will start IV heparin if INR is <2.5 Daily INR  [name], Rande Lawman 07/04/2018,9:23 PM

## 2018-07-04 NOTE — ED Notes (Signed)
Per 3W unable to take report at this time d/t not approved bed assignment.

## 2018-07-04 NOTE — Telephone Encounter (Signed)
Copied from [place] [id number]. Topic: General - Other >> Jul 04, 2018  9:19 AM Rainey Pines A wrote: Debbora Presto  NP from [facility] would like a callback from Dr. [name] in regards to patient and a situation he dealing with [phone]

## 2018-07-04 NOTE — ED Notes (Signed)
Neuro at bedside.

## 2018-07-04 NOTE — ED Triage Notes (Signed)
Patient had MRI brain yesterday and was directed to ED for admission and further work-up for small subdural. Patient ambulatory, alert and oriented

## 2018-07-04 NOTE — Consult Note (Addendum)
Chief Complaint   Chief Complaint  Patient presents with   subdural/ from [place] neuro    HPI   Consult requested by: Dr Lenor Derrick Reason for consult: SDH  HPI: SYLA DEVOSS is a 63 y.o. female who was advised to come to ED after outpt MRI ordered by Neurology revealed a subdural hematoma. Neurosurgery consulted due to SDH.  She reports since April 2018, after having her second open heart surgery (hypertrophic cardiomyopathy), she started to develop what were diagnosed as ocular migraines. She describes "flashes and squiggles" of light associated with headache behind eyes. This had been relatively constant up until Sept 2019 when symptoms changed. In addition to light changes, her eyes go in different directions and she is unable to move/speak. She is able to understand what people at saying. Episodes last approx 10-15 minutes and then they resolve spontaneously. Has roughly 5 episodes per week. She has been following up with Neurology. She did have an EEG several weeks ago which she reports was normal..   Over the past several days she has noticed mild frontal HA, dizziness and nausea without vomiting. She has been taking Zofran for the nausea with relief. She denies changes in vision, N/T, weakness, focal deficit, slurring speech. No recent trauma.  She is on Coumadin due to prior open heart surgeries in 2018. Last dose approx 2200 yesterday.  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   Dysfunction of Eustachian tube, bilateral 11/24/2017   Diarrhea 08/14/2017   Fever 08/14/2017   Whole body pain 08/14/2017   Migraine equivalent syndrome 05/31/2017   Ocular motility disturbance 05/31/2017   Bilateral otitis media with effusion 05/29/2017   Cough 02/22/2017   Wheezing 02/22/2017   Nosebleed 08/29/2016   Headache 08/29/2016   Chronic anticoagulation 05/16/2016   Encounter for  therapeutic drug monitoring 05/16/2016   S/P mitral valve replacement 05/16/2016   Dysuria 05/21/2015   Other hypertrophic cardiomyopathy ([place]) 03/11/2015   Syncope 08/26/2014   Wellness examination 07/08/2014   Change in vision 01/21/2014   Acute upper respiratory infection 01/14/2014   Ear lesion 08/24/2013   Right ear pain 04/10/2013   Left otitis media 12/25/2012   Chest pain on exertion 10/02/2012   Dyspnea on exertion 06/06/2012   Other fatigue 04/05/2012   Dizzy 02/07/2012   Fatigue 06/01/2010   Chronic sinus infection 04/12/2010   Dengue 02/22/2010   MASTECTOMY, BILATERAL, HX OF 08/12/2009   Dysfunction of eustachian tube 05/26/2009   ADD 01/20/2009   HYPOTHYROIDISM 03/06/2008   VITAMIN D DEFICIENCY 03/06/2008   RASH-NONVESICULAR 02/13/2008   HYPERLIPIDEMIA 12/12/2007   Hematemesis 10/25/2007   IBS 04/04/2007   RECTAL BLEEDING 04/04/2007   GI BLEEDING 04/04/2007   SHY-DRAGER SYNDROME 01/17/2007   VERTIGO 01/17/2007   NEOP, MALIGNANT, 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   NEOP, MALIGNANT, FEMALE BREAST NOS 10/15/2006   Anxiety state 10/15/2006   Depression 10/15/2006   Essential hypertension 10/15/2006   Allergic rhinitis 10/15/2006   CYST, OVARIAN NEC/NOS 10/15/2006   DYSAUTONOMIA 10/15/2006   DVT, HX OF 10/15/2006   COLONIC POLYPS, HX OF 10/15/2006   Personal History of Other Diseases of Digestive Disease 10/15/2006   COLECTOMY, PARTIAL, WITH ANASTOMOSIS, HX OF 10/15/2006    PMH: Past Medical History:  Diagnosis Date   Abdominal pain, epigastric 10/25/2007   ADD 01/20/2009   ALLERGIC RHINITIS 10/15/2006   ANGIOEDEMA 08/12/2008   ANXIETY 10/15/2006   ASTHMATIC BRONCHITIS, ACUTE 01/27/2008  Cellulitis and abscess of leg, except foot 08/12/2007   CELLULITIS, FACE 12/21/2008   CHEST PAIN 06/14/2007   Chronic anticoagulation 05/16/2016   Chronic sinus infection 04/12/2010   COLECTOMY, PARTIAL, WITH ANASTOMOSIS, HX OF  10/15/2006   COLONIC POLYPS, HX OF 10/15/2006   CYST, OVARIAN NEC/NOS 10/15/2006   Dengue 02/22/2010   DVT, HX OF 10/15/2006   DYSAUTONOMIA 10/15/2006   Dysfunction of eustachian tube 05/26/2009   GI BLEEDING 04/04/2007   Glossitis 03/06/2008   HEADACHE, CHRONIC 04/04/2007   Hematemesis 10/25/2007   HEMORRHOIDS 04/04/2007   HYPERLIPIDEMIA 12/12/2007   HYPERTENSION 10/15/2006   HYPOTHYROIDISM 03/06/2008   IBS 04/04/2007   MASTECTOMY, BILATERAL, HX OF 08/12/2009   NECK MASS 07/01/2007   NEOP, MALIGNANT, FEMALE BREAST NOS 10/15/2006   NEOP, MALIGNANT, 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   SYNCOPE 07/01/2007   Wheezing 08/20/2008   WOUND, OPEN, LEG, WITHOUT COMPLICATION 4/31/5400    PSH: Past Surgical History:  Procedure Laterality Date   ABDOMINAL HYSTERECTOMY     bilateral mastectomy/reconstruction     CESAREAN SECTION     CHOLECYSTECTOMY     COLONOSCOPY W/ BIOPSIES     FLEXIBLE SIGMOIDOSCOPY     LEFT AND RIGHT HEART CATHETERIZATION WITH CORONARY ANGIOGRAM N/A 10/07/2012   Procedure: LEFT AND RIGHT HEART CATHETERIZATION WITH CORONARY ANGIOGRAM;  Surgeon: Jolaine Artist, MD;  Location: [facility] CATH LAB;  Service: Cardiovascular;  Laterality: N/A;   multiple ablations     OOPHORECTOMY     open heart surg     spigellian hernia      (Not in a hospital admission)   SH: Social History   Tobacco Use   Smoking status: Never Smoker   Smokeless tobacco: Never Used  Substance Use Topics   Alcohol use: No   Drug use: No    MEDS: Prior to Admission medications   Medication Sig Start Date End Date Taking? Authorizing Provider  acetaminophen-codeine (TYLENOL #3) 300-30 MG tablet 1 tab by mouth four times per day as needed Patient taking differently: Take 1 tablet by mouth 4 (four) times daily as needed for moderate pain.  06/25/18   Biagio Borg, MD  cetirizine (ZYRTEC) 10 MG tablet Take 1 tablet (10 mg total) by mouth daily. 11/23/17 11/23/18  Biagio Borg, MD    fluticasone (FLONASE) 50 MCG/ACT nasal spray SHAKE LIQUID AND USE 2 SPRAYS IN EACH NOSTRIL DAILY Patient taking differently: Place 2 sprays into both nostrils daily.  01/05/17   Biagio Borg, MD  levothyroxine (SYNTHROID, LEVOTHROID) 50 MCG tablet TAKE ONE TABLET EVERY DAY Patient taking differently: Take 50 mcg by mouth daily before breakfast.  04/15/18   Biagio Borg, MD  losartan (COZAAR) 25 MG tablet Take 25 mg by mouth daily.    [provider]  metoprolol succinate (TOPROL-XL) 50 MG 24 hr tablet Take 1 tablet (50 mg total) by mouth daily. Take 1 by mouth daily Patient taking differently: Take 50 mg by mouth daily.  08/06/15   Biagio Borg, MD  triamcinolone (NASACORT) 55 MCG/ACT AERO nasal inhaler Place 2 sprays into the nose daily. 11/23/17   Biagio Borg, MD  venlafaxine XR (EFFEXOR-XR) 150 MG 24 hr capsule TAKE ONE CAPSULE DAILY 08/01/17   Biagio Borg, MD  warfarin (COUMADIN) 10 MG tablet Take 1 tablet daily or as directed by anticoagulation clinic 05/31/18   Biagio Borg, MD  zolpidem (AMBIEN) 10 MG tablet TAKE 1 TABLET BY MOUTH EVERY DAY AT BEDTIME AS NEEDED Patient taking  differently: Take 10 mg by mouth at bedtime as needed for sleep.  01/18/15   Biagio Borg, MD  zonisamide (ZONEGRAN) 100 MG capsule Take 2 capsules (200 mg total) by mouth at bedtime. 03/05/18   Dennie Bible, NP    ALLERGY: Allergies  Allergen Reactions   Fish Allergy Swelling   Latex Dermatitis    More than a day causes blisters    Levaquin [Levofloxacin In D5w]     n/v   Penicillins     REACTION: shock    Social History   Tobacco Use   Smoking status: Never Smoker   Smokeless tobacco: Never Used  Substance Use Topics   Alcohol use: No     Family History  Problem Relation Age of Onset   Heart disease Mother    Cancer Sister        ovarian cancer   Pancreatic cancer Father      ROS   Review of Systems  Constitutional: Negative.   HENT: Negative.   Eyes:  Negative.   Respiratory: Negative.   Cardiovascular: Negative.   Gastrointestinal: Positive for nausea. Negative for vomiting.  Genitourinary: Negative.   Musculoskeletal: Negative.   Skin: Negative.   Neurological: Positive for dizziness and headaches. Negative for tingling, tremors, sensory change, speech change, focal weakness, loss of consciousness and weakness. Seizures: questionable.    Exam   Vitals:   07/04/18 1700 07/04/18 1715  BP: (!) 115/59 114/61  Pulse: 69 68  Resp:    Temp:    SpO2: 98% 96%   General appearance: WDWN, NAD Eyes: No scleral injection Cardiovascular: Regular rate and rhythm without murmurs, rubs, gallops. No edema or variciosities. Distal pulses normal. Pulmonary: Effort normal, non-labored breathing Musculoskeletal:     Muscle tone upper extremities: Normal    Muscle tone lower extremities: Normal    Motor exam: Upper Extremities Deltoid Bicep Tricep Grip  Right 5/5 5/5 5/5 5/5  Left 5/5 5/5 5/5 5/5   Lower Extremity IP Quad PF DF EHL  Right 5/5 5/5 5/5 5/5 5/5  Left 5/5 5/5 5/5 5/5 5/5   Neurological Mental Status:    - Patient is awake, alert, oriented to person, place, month, year, and situation    - Patient is able to give a clear and coherent history.    - No signs of aphasia or neglect Cranial Nerves    - II: Visual Fields are full. PERRL    - III/IV/VI: EOMI without ptosis or diploplia.     - V: Facial sensation is grossly normal    - VII: Facial movement is symmetric.     - VIII: hearing is intact to voice    - X: Uvula elevates symmetrically    - XI: Shoulder shrug is symmetric.    - XII: tongue is midline without atrophy or fasciculations.  Sensory: Sensation grossly intact to LT Deep Tendon Reflexes    - 2+ and symmetric in the biceps and patellae.  Plantars   - Toes are downgoing bilaterally. Cerebellar    - FNF and HKS are intact bilaterally No drift   Results - Imaging/Labs   Results for orders placed or  performed during the hospital encounter of 07/03/18 (from the past 48 hour(s))  Creatinine, serum     Status: None   Collection Time: 07/03/18  1:30 PM  Result Value Ref Range   Creatinine, Ser 0.97 0.44 - 1.00 mg/dL   GFR calc non Af Amer >60 >60 mL/min  GFR calc Af Amer >60 >60 mL/min    Comment: Performed at [facility] [address] [place], [place] [address]    Ct Head Wo Contrast  Result Date: 07/04/2018 CLINICAL DATA:  63 year old female found to have small volume posterior fossa subdural hematoma on routine outpatient MRI yesterday done for seizure like activity. EXAM: CT HEAD WITHOUT CONTRAST TECHNIQUE: Contiguous axial images were obtained from the base of the skull through the vertex without intravenous contrast. COMPARISON:  MRI 07/03/2018.  Head CT 09/13/2016. FINDINGS: Brain: Subtle CT evidence of the small volume posterior fossa hematoma seen by MRI yesterday (sagittal image 37). And as expected this is new from the 2018 CT. The blood is intermediate density. No associated posterior fossa mass effect. No other intracranial hemorrhage identified. Gray-white matter differentiation is within normal limits throughout the brain. No ventriculomegaly. No cortically based acute infarct identified. Vascular: No suspicious intracranial vascular hyperdensity. Skull: Stable and intact.  No acute osseous abnormality identified. Sinuses/Orbits: Visualized paranasal sinuses and mastoids are stable and well pneumatized. Other: Visualized orbits and scalp soft tissues are within normal limits. IMPRESSION: 1. Re-demonstrated small volume posterior fossa subdural hematoma (trace by CT) as seen by MRI yesterday. This is intermediate density, and there is no associated mass effect. 2. No skull fracture identified and otherwise negative noncontrast CT appearance of the brain. Electronically Signed   By: Genevie Ann M.D.   On: 07/04/2018 18:07   Mr Jeri Cos QI Contrast  Addendum Date: 07/03/2018     ADDENDUM REPORT: 07/03/2018 21:06 ADDENDUM: Study discussed by telephone with Dr. Antony Contras (covering for Provider AMY [place] ) on 07/03/2018 at 2053 hours. Electronically Signed   By: Genevie Ann M.D.   On: 07/03/2018 21:06   Result Date: 07/03/2018 CLINICAL DATA:  63 year old female with seizure-like activity. The patient has an MRI compatible pacemaker, with recommended conditions followed and no adverse events. EXAM: MRI HEAD WITHOUT AND WITH CONTRAST TECHNIQUE: Multiplanar, multiecho pulse sequences of the brain and surrounding structures were obtained without and with intravenous contrast. CONTRAST:  10 milliliters Gadavist COMPARISON:  Head CT 09/13/2016 and earlier. FINDINGS: Brain: There is a small volume of subdural hemorrhage in the posterior fossa along the undersurface of the cerebellum (series 9, image 16 on the left and image 7 on the right) which appears new since 2018. Superimposed similar small volume of subdural blood along the ventral cisterna magna and in the upper cervical spine on series 9, image 11. The blood products at each of the sites measures 3-5 millimeters in thickness there is no significant mass effect. Otherwise there are occasional chronic micro hemorrhages in the brain (series 14, image 29), but no other acute intracranial hemorrhage. No superimposed restricted diffusion to suggest acute infarction. No midline shift, mass effect, ventriculomegaly. No intraventricular blood or debris. There is abnormal intrinsic T1 hyperintensity in the bilateral globus pallidus, most apparent on sagittal T1 (series 9, image 9). Otherwise gray and white matter signal is largely normal for age throughout the brain, no cortical encephalomalacia identified. The deep gray matter nuclei and brainstem appear normal. Thin slice imaging of the hippocampal formations appears symmetric and within normal limits. No abnormal enhancement identified. No dural thickening identified. Pituitary within normal limits.  Vascular: Major intracranial vascular flow voids are preserved. There is mild generalized intracranial artery tortuosity. The left vertebral artery appears mildly dominant. The major dural venous sinuses are enhancing and appear to be patent. Skull and upper cervical spine: Aside from the subdural  hemorrhage dorsal to the C2 vertebra (series 9, image 11), the visible upper cervical spine appears normal. Visualized bone marrow signal is within normal limits. Sinuses/Orbits: Negative orbits.  Paranasal sinuses are clear. Other: Mastoids are clear. Visible internal auditory structures appear normal. Scalp and face soft tissues appear negative. IMPRESSION: 1. Unexpected finding of small volume subdural hematoma in the posterior fossa, at the ventral cisterna magna, and in the upper cervical spine (series 9, image 11). Query recent trauma and/or coagulopathy. There is no significant mass effect. 2. Intrinsic increased T1 signal in the globus pallidus which can be seen in the setting of hepatic insufficiency, cirrhosis, portosystemic shunt, portal vein thrombosis, total parenteral nutrition, and is less often seen with hyperglycemia or other metabolic disturbances such as Wilson's disease. Correlation with serum ammonia levels and liver function tests is recommended. 3. Otherwise largely normal for age brain parenchyma; there are occasional chronic micro hemorrhages. Electronically Signed: By: Genevie Ann M.D. On: 07/03/2018 20:12    Impression/Plan   63 y.o. female with questionable seizure like activity who was found to have small volume SDH in posterior fossa, ventral cistern magna and upper C spine on outpatient MRI. She is neurologically intact on exam. She is on Coumadin.  Repeat head CT without interval change in SDH. No role for NS intervention. With her last dose of Coumadin being 20+ hours ago and no change in SDH, do not see a need to reverse her coumadin immediately. Will need to discontinue coumadin and  f/u with cardiology outpt due to spontaneous nature. INR pending. If >1.5 could give FFP and Vitamin K.  Patient will be admitted under [facility] for further work up per Neurology for EEG. Can repeat head CT tomorrow am at 1000 which is 36 hours since last coumadin dose to ensure no significant change. If stable, will be clear for outpt f/u. Will defer anti-seizure meds to Neurology. Neuro checks q 2 hours.  Ferne Reus, PA-C [facility] [employer]

## 2018-07-04 NOTE — ED Notes (Signed)
ED TO INPATIENT HANDOFF REPORT  ED Nurse Name and Phone #: William Hamburger [place] [phone]  S Name/Age/Gender [name] 63 y.o. female Room/Bed: 041C/041C  Code Status   Code Status: Not on file  Home/SNF/Other Home Patient oriented to: situation Is this baseline? No   Triage Complete: Triage complete  Chief Complaint Brain Bleed   Triage Note Patient had MRI brain yesterday and was directed to ED for admission and further work-up for small subdural. Patient ambulatory, alert and oriented   Allergies Allergies  Allergen Reactions  . Fish Allergy Swelling  . Latex Dermatitis    More than a day causes blisters   . Levaquin [Levofloxacin In D5w]     n/v  . Penicillins     REACTION: shock    Level of Care/Admitting Diagnosis ED Disposition    ED Disposition Condition [facility] Area: [place] [100100]  Level of Care: Telemetry Medical [104]  I expect the patient will be discharged within 24 hours: Yes  LOW acuity---Tx typically complete <24 hrs---ACUTE conditions typically can be evaluated <24 hours---LABS likely to return to acceptable levels <24 hours---IS near functional baseline---EXPECTED to return to current living arrangement---NOT newly hypoxic: Does not meet criteria for 5C-Observation unit  Covid Evaluation: Asymptomatic Screening Protocol (No Symptoms)  Diagnosis: SDH (subdural hematoma) ([place]) [242353]  Admitting Physician: Vianne Bulls [[id number]]  Attending Physician: Vianne Bulls [[id number]]  PT Class (Do Not Modify): Observation [104]  PT Acc Code (Do Not Modify): Observation [10022]       B Medical/Surgery History Past Medical History:  Diagnosis Date  . Abdominal pain, epigastric 10/25/2007  . ADD 01/20/2009  . ALLERGIC RHINITIS 10/15/2006  . ANGIOEDEMA 08/12/2008  . ANXIETY 10/15/2006  . ASTHMATIC BRONCHITIS, ACUTE 01/27/2008  . Cellulitis and abscess of leg, except foot 08/12/2007  . CELLULITIS, [place] 12/21/2008   . CHEST PAIN 06/14/2007  . Chronic anticoagulation 05/16/2016  . Chronic sinus infection 04/12/2010  . COLECTOMY, PARTIAL, WITH ANASTOMOSIS, HX OF 10/15/2006  . COLONIC POLYPS, HX OF 10/15/2006  . CYST, OVARIAN NEC/NOS 10/15/2006  . Dengue 02/22/2010  . DVT, HX OF 10/15/2006  . DYSAUTONOMIA 10/15/2006  . Dysfunction of eustachian tube 05/26/2009  . GI BLEEDING 04/04/2007  . Glossitis 03/06/2008  . HEADACHE, CHRONIC 04/04/2007  . Hematemesis 10/25/2007  . HEMORRHOIDS 04/04/2007  . HYPERLIPIDEMIA 12/12/2007  . HYPERTENSION 10/15/2006  . HYPOTHYROIDISM 03/06/2008  . IBS 04/04/2007  . MASTECTOMY, BILATERAL, HX OF 08/12/2009  . NECK MASS 07/01/2007  . NEOP, MALIGNANT, FEMALE BREAST NOS 10/15/2006  . 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 SYNCOPE 07/01/2007  . Wheezing 08/20/2008  . WOUND, OPEN, LEG, WITHOUT COMPLICATION 1/95/0932   Past Surgical History:  Procedure Laterality Date  . ABDOMINAL HYSTERECTOMY    . bilateral mastectomy/reconstruction    . CESAREAN SECTION    . CHOLECYSTECTOMY    . COLONOSCOPY W/ BIOPSIES    . FLEXIBLE SIGMOIDOSCOPY    . LEFT AND RIGHT HEART CATHETERIZATION WITH CORONARY ANGIOGRAM N/A 10/07/2012   Procedure: LEFT AND RIGHT HEART CATHETERIZATION WITH CORONARY ANGIOGRAM;  Surgeon: Jolaine Artist, MD;  Location: [facility] CATH LAB;  Service: Cardiovascular;  Laterality: N/A;  . multiple ablations    . OOPHORECTOMY    . open heart surg    . spigellian hernia       A IV Location/Drains/Wounds Patient Lines/Drains/Airways Status   Active Line/Drains/Airways    Name:   Placement date:   Placement time:  Site:   Days:   Peripheral IV 07/03/18 Right Antecubital   07/03/18    1324    Antecubital   1   Peripheral IV 07/04/18 Right;Anterior Forearm   07/04/18    1832    Forearm   less than 1          Intake/Output Last 24 hours No intake or output data in the 24 hours ending 07/04/18 1954  Labs/Imaging Results for orders placed or performed during the hospital  encounter of 07/04/18 (from the past 48 hour(s))  Basic metabolic panel     Status: Abnormal   Collection Time: 07/04/18  6:38 PM  Result Value Ref Range   Sodium 139 135 - 145 mmol/L   Potassium 3.6 3.5 - 5.1 mmol/L   Chloride 110 98 - 111 mmol/L   CO2 21 (L) 22 - 32 mmol/L   Glucose, Bld 99 70 - 99 mg/dL   BUN 16 8 - 23 mg/dL   Creatinine, Ser 0.87 0.44 - 1.00 mg/dL   Calcium 8.5 (L) 8.9 - 10.3 mg/dL   GFR calc non Af Amer >60 >60 mL/min   GFR calc Af Amer >60 >60 mL/min   Anion gap 8 5 - 15    Comment: Performed at [facility], [place] [address] [place], [place] [address]  CBC with Differential     Status: Abnormal   Collection Time: 07/04/18  6:38 PM  Result Value Ref Range   WBC 5.6 4.0 - 10.5 K/uL   RBC 4.34 3.87 - 5.11 MIL/uL   Hemoglobin 12.1 12.0 - 15.0 g/dL   HCT 35.8 (L) 36.0 - 46.0 %   MCV 82.5 80.0 - 100.0 fL   MCH 27.9 26.0 - 34.0 pg   MCHC 33.8 30.0 - 36.0 g/dL   RDW 14.6 11.5 - 15.5 %   Platelets 235 150 - 400 K/uL   nRBC 0.0 0.0 - 0.2 %   Neutrophils Relative % 57 %   Neutro Abs 3.2 1.7 - 7.7 K/uL   Lymphocytes Relative 28 %   Lymphs Abs 1.6 0.7 - 4.0 K/uL   Monocytes Relative 7 %   Monocytes Absolute 0.4 0.1 - 1.0 K/uL   Eosinophils Relative 7 %   Eosinophils Absolute 0.4 0.0 - 0.5 K/uL   Basophils Relative 1 %   Basophils Absolute 0.0 0.0 - 0.1 K/uL   Immature Granulocytes 0 %   Abs Immature Granulocytes 0.01 0.00 - 0.07 K/uL    Comment: Performed at [facility] [address], [place], [place] [address]  Protime-INR     Status: Abnormal   Collection Time: 07/04/18  6:38 PM  Result Value Ref Range   Prothrombin Time 28.5 (H) 11.4 - 15.2 seconds   INR 2.7 (H) 0.8 - 1.2    Comment: (NOTE) INR goal varies based on device and disease states. Performed at [facility], [place] [address], [place],  [address]   Prepare fresh frozen plasma     Status: None (Preliminary result)   Collection Time: 07/04/18  7:36 PM  Result Value  Ref Range   Unit Number [id number]    Blood Component Type THAWED PLASMA    Unit division 00    Status of Unit ALLOCATED    Transfusion Status OK TO TRANSFUSE    Ct Head Wo Contrast  Result Date: 07/04/2018 CLINICAL DATA:  63 year old female found to have small volume posterior fossa subdural hematoma on routine outpatient MRI yesterday done  for seizure like activity. EXAM: CT HEAD WITHOUT CONTRAST TECHNIQUE: Contiguous axial images were obtained from the base of the skull through the vertex without intravenous contrast. COMPARISON:  MRI 07/03/2018.  Head CT 09/13/2016. FINDINGS: Brain: Subtle CT evidence of the small volume posterior fossa hematoma seen by MRI yesterday (sagittal image 37). And as expected this is new from the 2018 CT. The blood is intermediate density. No associated posterior fossa mass effect. No other intracranial hemorrhage identified. Gray-white matter differentiation is within normal limits throughout the brain. No ventriculomegaly. No cortically based acute infarct identified. Vascular: No suspicious intracranial vascular hyperdensity. Skull: Stable and intact.  No acute osseous abnormality identified. Sinuses/Orbits: Visualized paranasal sinuses and mastoids are stable and well pneumatized. Other: Visualized orbits and scalp soft tissues are within normal limits. IMPRESSION: 1. Re-demonstrated small volume posterior fossa subdural hematoma (trace by CT) as seen by MRI yesterday. This is intermediate density, and there is no associated mass effect. 2. No skull fracture identified and otherwise negative noncontrast CT appearance of the brain. Electronically Signed   By: Genevie Ann M.D.   On: 07/04/2018 18:07   Mr Jeri Cos YQ Contrast  Addendum Date: 07/03/2018   ADDENDUM REPORT: 07/03/2018 21:06 ADDENDUM: Study discussed by telephone with Dr. Antony Contras (covering for Provider AMY Montgomery ) on 07/03/2018 at 2053 hours. Electronically Signed   By: Genevie Ann M.D.   On: 07/03/2018 21:06    Result Date: 07/03/2018 CLINICAL DATA:  63 year old female with seizure-like activity. The patient has an MRI compatible pacemaker, with recommended conditions followed and no adverse events. EXAM: MRI HEAD WITHOUT AND WITH CONTRAST TECHNIQUE: Multiplanar, multiecho pulse sequences of the brain and surrounding structures were obtained without and with intravenous contrast. CONTRAST:  10 milliliters Gadavist COMPARISON:  Head CT 09/13/2016 and earlier. FINDINGS: Brain: There is a small volume of subdural hemorrhage in the posterior fossa along the undersurface of the cerebellum (series 9, image 16 on the left and image 7 on the right) which appears new since 2018. Superimposed similar small volume of subdural blood along the ventral cisterna magna and in the upper cervical spine on series 9, image 11. The blood products at each of the sites measures 3-5 millimeters in thickness there is no significant mass effect. Otherwise there are occasional chronic micro hemorrhages in the brain (series 14, image 29), but no other acute intracranial hemorrhage. No superimposed restricted diffusion to suggest acute infarction. No midline shift, mass effect, ventriculomegaly. No intraventricular blood or debris. There is abnormal intrinsic T1 hyperintensity in the bilateral globus pallidus, most apparent on sagittal T1 (series 9, image 9). Otherwise gray and white matter signal is largely normal for age throughout the brain, no cortical encephalomalacia identified. The deep gray matter nuclei and brainstem appear normal. Thin slice imaging of the hippocampal formations appears symmetric and within normal limits. No abnormal enhancement identified. No dural thickening identified. Pituitary within normal limits. Vascular: Major intracranial vascular flow voids are preserved. There is mild generalized intracranial artery tortuosity. The left vertebral artery appears mildly dominant. The major dural venous sinuses are enhancing and  appear to be patent. Skull and upper cervical spine: Aside from the subdural hemorrhage dorsal to the C2 vertebra (series 9, image 11), the visible upper cervical spine appears normal. Visualized bone marrow signal is within normal limits. Sinuses/Orbits: Negative orbits.  Paranasal sinuses are clear. Other: Mastoids are clear. Visible internal auditory structures appear normal. Scalp and face soft tissues appear negative. IMPRESSION: 1. Unexpected finding of small  volume subdural hematoma in the posterior fossa, at the ventral cisterna magna, and in the upper cervical spine (series 9, image 11). Query recent trauma and/or coagulopathy. There is no significant mass effect. 2. Intrinsic increased T1 signal in the globus pallidus which can be seen in the setting of hepatic insufficiency, cirrhosis, portosystemic shunt, portal vein thrombosis, total parenteral nutrition, and is less often seen with hyperglycemia or other metabolic disturbances such as Wilson's disease. Correlation with serum ammonia levels and liver function tests is recommended. 3. Otherwise largely normal for age brain parenchyma; there are occasional chronic micro hemorrhages. Electronically Signed: By: Genevie Ann M.D. On: 07/03/2018 20:12    Pending Labs Unresulted Labs (From admission, onward)    Start     Ordered   07/04/18 1929  Type and screen [place]  Once,   STAT    Comments: [place]    07/04/18 1929   Signed and Held  HIV antibody (Routine Testing)  Tomorrow morning,   R     Signed and Held   Signed and Held  Basic metabolic panel  Tomorrow morning,   R     Signed and Held   Signed and Held  Protime-INR  Tomorrow morning,   R     Signed and Held   Signed and Held  CBC  Tomorrow morning,   R     Signed and Held          Vitals/Pain Today's Vitals   07/04/18 1645 07/04/18 1700 07/04/18 1715 07/04/18 1840  BP: 117/61 (!) 115/59 114/61   Pulse: 68 69 68   Resp:      Temp:       TempSrc:      SpO2: 97% 98% 96%   PainSc:    0-No pain    Isolation Precautions No active isolations  Medications Medications  phytonadione (VITAMIN K) 10 mg in dextrose 5 % 50 mL IVPB (has no administration in time range)  0.9 %  sodium chloride infusion (Manually program via Guardrails IV Fluids) (has no administration in time range)    Mobility walks with person assist Low fall risk   Focused Assessments Neuro Assessment Handoff:  Swallow screen pass? No    NIH Stroke Scale ( + Modified Stroke Scale Criteria)  Interval: Initial Level of Consciousness (1a.)   : Alert, keenly responsive LOC Questions (1b. )   +: Answers both questions correctly LOC Commands (1c. )   + : Performs both tasks correctly Best Gaze (2. )  +: Normal Visual (3. )  +: No visual loss Facial Palsy (4. )    : Normal symmetrical movements Motor Arm, Right (5b. )   +: No drift Motor Leg, Left (6a. )   +: No drift Motor Leg, Right (6b. )   +: No drift Limb Ataxia (7. ): Absent Sensory (8. )   +: Normal, no sensory loss Best Language (9. )   +: No aphasia Dysarthria (10. ): Normal Extinction/Inattention (11.)   +: No Abnormality     Neuro Assessment: Exceptions to WDL Neuro Checks:   Initial (07/04/18 1639)  Last Documented NIHSS Modified Score:   Has TPA been given? No If patient is a Neuro Trauma and patient is going to OR before floor call report to [place] nurse: [phone] or [phone]     R Recommendations: See Admitting Provider Note  Report given to:   Additional Notes: Call if you have more ??s

## 2018-07-04 NOTE — Consult Note (Signed)
NEURO HOSPITALIST CONSULT NOTE   Requesting physician: Dr. Tamera Punt  Reason for Consult: Seizure like activity, subdural hematoma  History obtained from:  Patient and Chart     HPI:                                                                                                                                          [name] is an 63 y.o. female with a history of MVR on coumadin, hypothyroidism, HTN, HLD, and presumed ocular migraines who was recently seen at [facility] and had an MRI that showed a small collection of subdural blood in the posterior fossa. She was noted to be very dizzy and had two "really bad events" where she had abnormal eye movements and was unable to walk or talk at that time. Patient was advised to come into the ED given her findings on MRI.   Patient denied any trauma or falls. She states that she has "brain seizures" that are "a mess" when they occur.  She reports that when they occur her eyes will go in multiple directions, she is unable to balance, cannot talk or speak in full sentences, and will only be able to say the last word that she said prior to the episode.  She states that they last for 10 to 20 minutes.  She reports that they started in September last year.  She denies any jerking or twitching movements.  She currently is on Zonegran for seizure control and she restates that this medication helped a little bit and brought down the frequency of her spells; however, they resumed and the Zonegran had then been increased which again helped; unfortunately, again the episodes returned.    Past Medical History:  Diagnosis Date  . Abdominal pain, epigastric 10/25/2007  . ADD 01/20/2009  . ALLERGIC RHINITIS 10/15/2006  . ANGIOEDEMA 08/12/2008  . ANXIETY 10/15/2006  . ASTHMATIC BRONCHITIS, ACUTE 01/27/2008  . Cellulitis and abscess of leg, except foot 08/12/2007  . CELLULITIS, [place] 12/21/2008  . CHEST PAIN 06/14/2007  .  Chronic anticoagulation 05/16/2016  . Chronic sinus infection 04/12/2010  . COLECTOMY, PARTIAL, WITH ANASTOMOSIS, HX OF 10/15/2006  . COLONIC POLYPS, HX OF 10/15/2006  . CYST, OVARIAN NEC/NOS 10/15/2006  . Dengue 02/22/2010  . DVT, HX OF 10/15/2006  . DYSAUTONOMIA 10/15/2006  . Dysfunction of eustachian tube 05/26/2009  . GI BLEEDING 04/04/2007  . Glossitis 03/06/2008  . HEADACHE, CHRONIC 04/04/2007  . Hematemesis 10/25/2007  . HEMORRHOIDS 04/04/2007  . HYPERLIPIDEMIA 12/12/2007  . HYPERTENSION 10/15/2006  . HYPOTHYROIDISM 03/06/2008  . IBS 04/04/2007  . MASTECTOMY, BILATERAL, HX OF 08/12/2009  . NECK MASS 07/01/2007  . NEOP, MALIGNANT, FEMALE BREAST NOS 10/15/2006  . 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 SYNCOPE 07/01/2007  . Wheezing  08/20/2008  . WOUND, OPEN, LEG, WITHOUT COMPLICATION 9/50/9326    Past Surgical History:  Procedure Laterality Date  . ABDOMINAL HYSTERECTOMY    . bilateral mastectomy/reconstruction    . CESAREAN SECTION    . CHOLECYSTECTOMY    . COLONOSCOPY W/ BIOPSIES    . FLEXIBLE SIGMOIDOSCOPY    . LEFT AND RIGHT HEART CATHETERIZATION WITH CORONARY ANGIOGRAM N/A 10/07/2012   Procedure: LEFT AND RIGHT HEART CATHETERIZATION WITH CORONARY ANGIOGRAM;  Surgeon: Jolaine Artist, MD;  Location: [facility] CATH LAB;  Service: Cardiovascular;  Laterality: N/A;  . multiple ablations    . OOPHORECTOMY    . open heart surg    . spigellian hernia      Family History  Problem Relation Age of Onset  . Heart disease Mother   . Cancer Sister        ovarian cancer  . Pancreatic cancer Father               Family History:  Family History  Problem Relation Age of Onset  . Heart disease Mother   . Cancer Sister        ovarian cancer  . Pancreatic cancer Father    Social History:  reports that she has never smoked. She has never used smokeless tobacco. She reports that she does not drink alcohol or use drugs.  Allergies  Allergen Reactions  . Fish Allergy Swelling  . Latex  Dermatitis    More than a day causes blisters   . Levaquin [Levofloxacin In D5w]     n/v  . Penicillins     REACTION: shock    HOME MEDICATIONS:                                                                                                                     Tylenol Zyrtec Flonase Synthroid Losartan Metoprolol Nasacort Venlafaxine Warfarin Zonegran 200 mg qhs  ROS:                                                                                                                                       History obtained from chart review and the patient  General ROS: negative for - chills, fatigue, fever, night sweats, weight gain or weight loss Psychological ROS: negative for - behavioral disorder, hallucinations, memory difficulties, mood swings or suicidal ideation Ophthalmic ROS: negative for - blurry vision, double vision, eye pain or loss of  vision ENT ROS: negative for - epistaxis, nasal discharge, oral lesions, sore throat, tinnitus or vertigo Allergy and Immunology ROS: negative for - hives or itchy/watery eyes Hematological and Lymphatic ROS: negative for - bleeding problems, bruising or swollen lymph nodes Endocrine ROS: negative for - galactorrhea, hair pattern changes, polydipsia/polyuria or temperature intolerance Respiratory ROS: negative for - cough, hemoptysis, shortness of breath or wheezing Cardiovascular ROS: negative for - chest pain, dyspnea on exertion, edema or irregular heartbeat Gastrointestinal ROS: negative for - abdominal pain, diarrhea, hematemesis, nausea/vomiting or stool incontinence Genito-Urinary ROS: negative for - dysuria, hematuria, incontinence or urinary frequency/urgency Musculoskeletal ROS: negative for - joint swelling or muscular weakness Neurological ROS: as noted in HPI Dermatological ROS: negative for rash and skin lesion changes   Blood pressure 122/78, pulse 69, temperature 98.3 F (36.8 C), temperature source Oral, resp. rate 16,  SpO2 95 %.   General Examination:                                                                                                       Physical Exam  HEENT-  Normocephalic, no lesions, without obvious abnormality.  Normal external eye and conjunctiva.    Neurological Examination Mental Status: Alert, oriented, thought content appropriate.  Speech fluent without evidence of aphasia.  Able to follow 3 step commands without difficulty. Cranial Nerves: II: Visual fields intact III,IV, VI: ptosis not present, extra-ocular motions intact bilaterally pupils equal, round, reactive to light and accommodation V,VII: smile symmetric, facial light touch sensation normal bilaterally VIII: hearing normal bilaterally IX,X: uvula rises symmetrically XI: bilateral shoulder shrug XII: midline tongue extension Motor: Right : Upper extremity   5/5    Left:     Upper extremity   5/5  Lower extremity   5/5     Lower extremity   5/5 Tone and bulk:normal tone throughout; no atrophy noted Sensory: Pinprick and light touch intact throughout, bilaterally Deep Tendon Reflexes:  Right: Upper Extremity   Left: Upper extremity   biceps (C-5 to C-6) 1/4   biceps (C-5 to C-6) 1/4 Brachioradialis (C6) 1/4  Brachioradialis (C6) 1/4  Lower Extremity  quadriceps (L-2 to L-4) 1/4   quadriceps (L-2 to L-4) 2/4 Achilles (S1) 2/4   Achilles (S1) 1/4  Plantars: Deferred due to patient preference Cerebellar: normal finger-to-nose, normal heel-to-shin testing bilaterally  Gait: Deferred   Lab Results: Basic Metabolic Panel: Recent Labs  Lab 07/03/18 1330  CREATININE 0.97    CBC: No results for input(s): WBC, NEUTROABS, HGB, HCT, MCV, PLT in the last 168 hours.  Cardiac Enzymes: No results for input(s): CKTOTAL, CKMB, CKMBINDEX, TROPONINI in the last 168 hours.  Lipid Panel: No results for input(s): CHOL, TRIG, HDL, CHOLHDL, VLDL, LDLCALC in the last 168 hours.  Imaging: Mr Jeri Cos WU  Contrast  Addendum Date: 07/03/2018   ADDENDUM REPORT: 07/03/2018 21:06 ADDENDUM: Study discussed by telephone with Dr. Antony Contras (covering for Provider AMY Prescott ) on 07/03/2018 at 2053 hours. Electronically Signed   By: Genevie Ann M.D.   On: 07/03/2018 21:06   Result  Date: 07/03/2018 CLINICAL DATA:  63 year old female with seizure-like activity. The patient has an MRI compatible pacemaker, with recommended conditions followed and no adverse events. EXAM: MRI HEAD WITHOUT AND WITH CONTRAST TECHNIQUE: Multiplanar, multiecho pulse sequences of the brain and surrounding structures were obtained without and with intravenous contrast. CONTRAST:  10 milliliters Gadavist COMPARISON:  Head CT 09/13/2016 and earlier. FINDINGS: Brain: There is a small volume of subdural hemorrhage in the posterior fossa along the undersurface of the cerebellum (series 9, image 16 on the left and image 7 on the right) which appears new since 2018. Superimposed similar small volume of subdural blood along the ventral cisterna magna and in the upper cervical spine on series 9, image 11. The blood products at each of the sites measures 3-5 millimeters in thickness there is no significant mass effect. Otherwise there are occasional chronic micro hemorrhages in the brain (series 14, image 29), but no other acute intracranial hemorrhage. No superimposed restricted diffusion to suggest acute infarction. No midline shift, mass effect, ventriculomegaly. No intraventricular blood or debris. There is abnormal intrinsic T1 hyperintensity in the bilateral globus pallidus, most apparent on sagittal T1 (series 9, image 9). Otherwise gray and white matter signal is largely normal for age throughout the brain, no cortical encephalomalacia identified. The deep gray matter nuclei and brainstem appear normal. Thin slice imaging of the hippocampal formations appears symmetric and within normal limits. No abnormal enhancement identified. No dural thickening  identified. Pituitary within normal limits. Vascular: Major intracranial vascular flow voids are preserved. There is mild generalized intracranial artery tortuosity. The left vertebral artery appears mildly dominant. The major dural venous sinuses are enhancing and appear to be patent. Skull and upper cervical spine: Aside from the subdural hemorrhage dorsal to the C2 vertebra (series 9, image 11), the visible upper cervical spine appears normal. Visualized bone marrow signal is within normal limits. Sinuses/Orbits: Negative orbits.  Paranasal sinuses are clear. Other: Mastoids are clear. Visible internal auditory structures appear normal. Scalp and face soft tissues appear negative. IMPRESSION: 1. Unexpected finding of small volume subdural hematoma in the posterior fossa, at the ventral cisterna magna, and in the upper cervical spine (series 9, image 11). Query recent trauma and/or coagulopathy. There is no significant mass effect. 2. Intrinsic increased T1 signal in the globus pallidus which can be seen in the setting of hepatic insufficiency, cirrhosis, portosystemic shunt, portal vein thrombosis, total parenteral nutrition, and is less often seen with hyperglycemia or other metabolic disturbances such as Wilson's disease. Correlation with serum ammonia levels and liver function tests is recommended. 3. Otherwise largely normal for age brain parenchyma; there are occasional chronic micro hemorrhages. Electronically Signed: By: Genevie Ann M.D. On: 07/03/2018 20:12     Assessment:  63 y.o. female with a history of MVR on coumadin, hypothyroidism, HTN, HLD, and presumed occular migraines who was recently seen at [facility] and had an MRI that showed a small collection of subdural blood in the posterior fossa.  She was also noted to have episodes of possible seizure like activty, however this may be related to the presumed ocular migraines.  She has been having these migraine-like versus seizure-like spells since  September 2019.  She states that Zonegran seems to help with her symptoms but she has been having more frequent episodes. EEG on 06/19/18 showed no seizure like activity.  1. CT and MRI were personally reviewed. I am not entirely convinced that the findings in the posterior fossa and cervical spinal canal represent subdural hematoma.  The locations are atypical for subdural hematomas and the signal characteristics (homogeneous T1 hyperintensity) may also represent unusual presentation of multifocal lipomas or hypercellular dural infiltrate. Given the high risk of stroke without anticoagulation in the setting of mechanical cardiac valve, would continue Coumadin for now and monitor closely with frequent neuro checks as well as repeat imaging at close intervals.  2. Neurosurgery may be helpful in reviewing the images for a 3rd opinion regarding the identity of the T1 hyperintense dural based posterior fossa abnormalities seen on recent MRI, which are not clearly seen on repeat CT in the ED today (should be easily discernible on CT if the abnormalities do indeed represent hemorrhagic fluid collections).  3. Of note, she denies any recent trauma that could explain presence of subdural hematomas in atypical locations.   Plan: -- Regarding symptoms concerning for seizure like activity, she had an EEG on 6/17 that was negative however given the frequent episodes and concerning symptoms will repeat EEG to monitor.   -- Regarding the posterior fossa MRI findings concerning for possible multifocal and atypically-located subdural hematomas, EDP consulted neurosurgery and is holding Coumadin now. In my opnion, Coumadin should be continued from a risk/benefit standpoint given the high risk of stroke off anticoagulation with mechanical heart valve, and the relatively high likelihood of an alternate explanation for her dural-based signal abnormalities seen on MRI.  -- Neuro will continue to follow -- Discussed with Dr. Myna Hidalgo  from the Hospitalist service   Electronically signed: Dr. Kerney Elbe 07/04/2018, 5:16 PM

## 2018-07-05 ENCOUNTER — Observation Stay (HOSPITAL_COMMUNITY): Payer: BC Managed Care – PPO

## 2018-07-05 ENCOUNTER — Encounter (HOSPITAL_COMMUNITY): Payer: Self-pay | Admitting: *Deleted

## 2018-07-05 DIAGNOSIS — E039 Hypothyroidism, unspecified: Secondary | ICD-10-CM | POA: Diagnosis present

## 2018-07-05 DIAGNOSIS — S065X9A Traumatic subdural hemorrhage with loss of consciousness of unspecified duration, initial encounter: Secondary | ICD-10-CM | POA: Diagnosis present

## 2018-07-05 DIAGNOSIS — Z86718 Personal history of other venous thrombosis and embolism: Secondary | ICD-10-CM | POA: Diagnosis not present

## 2018-07-05 DIAGNOSIS — Z6833 Body mass index (BMI) 33.0-33.9, adult: Secondary | ICD-10-CM | POA: Diagnosis not present

## 2018-07-05 DIAGNOSIS — I10 Essential (primary) hypertension: Secondary | ICD-10-CM | POA: Diagnosis present

## 2018-07-05 DIAGNOSIS — R569 Unspecified convulsions: Secondary | ICD-10-CM

## 2018-07-05 DIAGNOSIS — Z1159 Encounter for screening for other viral diseases: Secondary | ICD-10-CM | POA: Diagnosis not present

## 2018-07-05 DIAGNOSIS — I6200 Nontraumatic subdural hemorrhage, unspecified: Secondary | ICD-10-CM | POA: Diagnosis not present

## 2018-07-05 DIAGNOSIS — Z79890 Hormone replacement therapy (postmenopausal): Secondary | ICD-10-CM | POA: Diagnosis not present

## 2018-07-05 DIAGNOSIS — Z8041 Family history of malignant neoplasm of ovary: Secondary | ICD-10-CM | POA: Diagnosis not present

## 2018-07-05 DIAGNOSIS — G43109 Migraine with aura, not intractable, without status migrainosus: Secondary | ICD-10-CM | POA: Diagnosis present

## 2018-07-05 DIAGNOSIS — Z79899 Other long term (current) drug therapy: Secondary | ICD-10-CM | POA: Diagnosis not present

## 2018-07-05 DIAGNOSIS — Z7901 Long term (current) use of anticoagulants: Secondary | ICD-10-CM | POA: Diagnosis not present

## 2018-07-05 DIAGNOSIS — F329 Major depressive disorder, single episode, unspecified: Secondary | ICD-10-CM | POA: Diagnosis present

## 2018-07-05 DIAGNOSIS — E669 Obesity, unspecified: Secondary | ICD-10-CM | POA: Diagnosis present

## 2018-07-05 DIAGNOSIS — I422 Other hypertrophic cardiomyopathy: Secondary | ICD-10-CM

## 2018-07-05 DIAGNOSIS — Z952 Presence of prosthetic heart valve: Secondary | ICD-10-CM | POA: Diagnosis not present

## 2018-07-05 DIAGNOSIS — E785 Hyperlipidemia, unspecified: Secondary | ICD-10-CM | POA: Diagnosis present

## 2018-07-05 DIAGNOSIS — Z90710 Acquired absence of both cervix and uterus: Secondary | ICD-10-CM | POA: Diagnosis not present

## 2018-07-05 DIAGNOSIS — T45515A Adverse effect of anticoagulants, initial encounter: Secondary | ICD-10-CM | POA: Diagnosis present

## 2018-07-05 DIAGNOSIS — Z9013 Acquired absence of bilateral breasts and nipples: Secondary | ICD-10-CM | POA: Diagnosis not present

## 2018-07-05 DIAGNOSIS — Z800 Family history of malignant neoplasm of digestive organs: Secondary | ICD-10-CM | POA: Diagnosis not present

## 2018-07-05 DIAGNOSIS — Z8249 Family history of ischemic heart disease and other diseases of the circulatory system: Secondary | ICD-10-CM | POA: Diagnosis not present

## 2018-07-05 LAB — BASIC METABOLIC PANEL
Anion gap: 9 (ref 5–15)
BUN: 17 mg/dL (ref 8–23)
CO2: 24 mmol/L (ref 22–32)
Calcium: 9.3 mg/dL (ref 8.9–10.3)
Chloride: 109 mmol/L (ref 98–111)
Creatinine, Ser: 0.99 mg/dL (ref 0.44–1.00)
GFR calc Af Amer: >60 mL/min (ref >60)
GFR calc non Af Amer: >60 mL/min (ref >60)
Glucose, Bld: 115 mg/dL — ABNORMAL HIGH (ref 70–99)
Potassium: 4.2 mmol/L (ref 3.5–5.1)
Sodium: 142 mmol/L (ref 135–145)

## 2018-07-05 LAB — CBC
HCT: 38.6 % (ref 36.0–46.0)
Hemoglobin: 12.7 g/dL (ref 12.0–15.0)
MCH: 27.2 pg (ref 26.0–34.0)
MCHC: 32.9 g/dL (ref 30.0–36.0)
MCV: 82.7 fL (ref 80.0–100.0)
Platelets: 237 10*3/uL (ref 150–400)
RBC: 4.67 MIL/uL (ref 3.87–5.11)
RDW: 14.6 % (ref 11.5–15.5)
WBC: 5.5 10*3/uL (ref 4.0–10.5)
nRBC: 0 % (ref 0.0–0.2)

## 2018-07-05 LAB — PROTIME-INR
INR: 1.5 — ABNORMAL HIGH (ref 0.8–1.2)
Prothrombin Time: 18 seconds — ABNORMAL HIGH (ref 11.4–15.2)

## 2018-07-05 LAB — HIV ANTIBODY (ROUTINE TESTING W REFLEX): HIV Screen 4th Generation wRfx: NONREACTIVE

## 2018-07-05 LAB — HEPARIN LEVEL (UNFRACTIONATED)
Heparin Unfractionated: <0.1 IU/mL — ABNORMAL LOW (ref 0.30–0.70)
Heparin Unfractionated: 0.2 IU/mL — ABNORMAL LOW (ref 0.30–0.70)

## 2018-07-05 MED ORDER — HEPARIN (PORCINE) 25000 UT/250ML-% IV SOLN
1100.0000 [IU]/h | INTRAVENOUS | Status: DC
  Administered 2018-07-05: 1000 [IU]/h via INTRAVENOUS
  Administered 2018-07-06 – 2018-07-13 (×8): 1150 [IU]/h via INTRAVENOUS
  Administered 2018-07-14: 1100 [IU]/h via INTRAVENOUS
  Filled 2018-07-05 (×11): qty 250

## 2018-07-05 NOTE — Progress Notes (Addendum)
  NEUROSURGERY PROGRESS NOTE   No issues overnight.  No concerns this am. Denies HA, dizziness, N/V  EXAM:  BP (!) 141/69 (BP Location: Right Arm)   Pulse 75   Temp 98.4 F (36.9 C) (Oral)   Resp 16   SpO2 98%   Awake, alert, oriented  Speech fluent, appropriate  CN grossly intact  5/5 BUE/BLE   PLAN Stable neurologically. Dr Kathyrn Sheriff discussed with Neurology risks vs benefits of restarting anti-coag with mechanical valve. Will okay restarting of heparin. Discussed with pharmacy. Frequent neuro checks. Report any change.

## 2018-07-05 NOTE — Progress Notes (Signed)
ANTICOAGULATION CONSULT NOTE  Pharmacy Consult:  Heparin Indication: mechanical valve  Allergies  Allergen Reactions  . Fish Allergy Anaphylaxis and Swelling    Scaled fish, like flounder  . Penicillins Anaphylaxis and Hives    Did it involve swelling of the face/tongue/throat, SOB, or low BP? Yes Did it involve sudden or severe rash/hives, skin peeling, or any reaction on the inside of your mouth or nose? Unk Did you need to seek medical attention at a hospital or doctor's office? No When did it last happen? "I was 63 years old" If all above answers are "NO", may proceed with cephalosporin use.   [name] [Levofloxacin In D5w] Nausea And Vomiting  . Latex Dermatitis and Rash    If worn for more than a day, this causes blisters     Patient Measurements: Height: 5\' 8"  (172.7 cm) Weight: 218 lb 4.1 oz (99 kg) IBW/kg (Calculated) : 63.9  Vital Signs: Temp: 98.5 F (36.9 C) (07/03 1946) Temp Source: Oral (07/03 1946) BP: 148/93 (07/03 1946) Pulse Rate: 76 (07/03 1946)  Labs: Recent Labs    07/03/18 1330 07/04/18 1838 07/05/18 0421 07/05/18 1514 07/05/18 1838  HGB  --  12.1 12.7  --   --   HCT  --  35.8* 38.6  --   --   PLT  --  235 237  --   --   LABPROT  --  28.5* 18.0*  --   --   INR  --  2.7* 1.5*  --   --   HEPARINUNFRC  --   --   --  <0.10* 0.20*  CREATININE 0.97 0.87 0.99  --   --     Estimated Creatinine Clearance: 71.5 mL/min (by C-G formula based on SCr of 0.99 mg/dL).   Assessment: 42 yof presented to the ED with dizziness and a mild headache. Initially thought to have an SDH but per discussions with neurology, unclear if this is truly an SDH. She is on chronic warfarin for a history of a mechanical valve. INR is therapeutic on admission at 2.7 - received vitamin K 10 mg x1 on 7/2.   Discussed with neurology and neurosurgery, okay to start heparin infusion given subtherapeutic INR in setting of heart valve.   Heparin level is sub-therapeutic.  No  issue reported.   Goal of Therapy:  Heparin level: 0.3-0.5 units/mL INR 2.5-3.5 Monitor platelets by anticoagulation protocol: Yes   Plan:  Increase heparin gtt to 1150 units/hr Check 6 hr heparin level  Blayn Whetsell D. Mina Marble, PharmD, BCPS, [place] 07/05/2018, 8:15 PM

## 2018-07-05 NOTE — Progress Notes (Signed)
STROKE TEAM PROGRESS NOTE   INTERVAL HISTORY Patient sitting in bed, no complaints.  She recounted HPI with me.  Patient stated that she had mechanical valve placement in 04/2016.  Since then, she had ocular migraine, happening once a week.  Starting last September, her ocular migraine stopped, however, she started to have episode of disconjugate eyes, difficulty with vision, dizziness, not able to talk without loss of consciousness, not able to move arm or leg for 20 to 30 minutes.  The episode happens once a week initially, gradually increasing frequency, saw Dr. Felecia Shelling at [facility], started Zonegran, initially effective, but later on even with increasing Zonegran dose, episode still more frequent.  Recently it goes to 4-5 times every week.  Last episode on Monday.  She had MRI 2 days ago showed small subdural hematoma at bilateral posterior fossa and C1-C2 upper cervical.  She was sent to [facility]  Her INR was 2.7 yesterday, it was reversed with FFP and vitamin K.  Repeat CT no acute change.  Vitals:   07/04/18 2355 07/05/18 0354 07/05/18 0802 07/05/18 0900  BP: 123/63 135/85 (!) 141/69   Pulse: 75 79 75   Resp: 16 16 16    Temp: 97.9 F (36.6 C) 97.7 F (36.5 C) 98.4 F (36.9 C)   TempSrc: Oral Oral Oral   SpO2: 96% 99% 98%   Weight:    99 kg  Height:    5\' 8"  (1.727 m)    CBC:  Recent Labs  Lab 07/04/18 1838 07/05/18 0421  WBC 5.6 5.5  NEUTROABS 3.2  --   HGB 12.1 12.7  HCT 35.8* 38.6  MCV 82.5 82.7  PLT 235 976    Basic Metabolic Panel:  Recent Labs  Lab 07/04/18 1838 07/05/18 0421  NA 139 142  K 3.6 4.2  CL 110 109  CO2 21* 24  GLUCOSE 99 115*  BUN 16 17  CREATININE 0.87 0.99  CALCIUM 8.5* 9.3   Lipid Panel:     Component Value Date/Time   CHOL 209 (H) 12/13/2011 1244   TRIG 195.0 (H) 12/13/2011 1244   HDL 45.10 12/13/2011 1244   CHOLHDL 5 12/13/2011 1244   VLDL 39.0 12/13/2011 1244   LDLCALC 73 02/11/2008 1444   HgbA1c:  Lab Results  Component Value Date    HGBA1C  08/12/2007    5.2 (NOTE)   The ADA recommends the following therapeutic goal for glycemic   control related to Hgb A1C measurement:   Goal of Therapy:   < 7.0% Hgb A1C   Reference: American Diabetes Association: Clinical Practice   Recommendations 2008, Diabetes Care,  2008, 31:(Suppl 1).   Urine Drug Screen: No results found for: LABOPIA, COCAINSCRNUR, LABBENZ, AMPHETMU, THCU, LABBARB  Alcohol Level No results found for: ETH  IMAGING Ct Head Wo Contrast  Result Date: 07/04/2018 CLINICAL DATA:  63 year old female found to have small volume posterior fossa subdural hematoma on routine outpatient MRI yesterday done for seizure like activity. EXAM: CT HEAD WITHOUT CONTRAST TECHNIQUE: Contiguous axial images were obtained from the base of the skull through the vertex without intravenous contrast. COMPARISON:  MRI 07/03/2018.  Head CT 09/13/2016. FINDINGS: Brain: Subtle CT evidence of the small volume posterior fossa hematoma seen by MRI yesterday (sagittal image 37). And as expected this is new from the 2018 CT. The blood is intermediate density. No associated posterior fossa mass effect. No other intracranial hemorrhage identified. Gray-white matter differentiation is within normal limits throughout the brain. No ventriculomegaly. No cortically  based acute infarct identified. Vascular: No suspicious intracranial vascular hyperdensity. Skull: Stable and intact.  No acute osseous abnormality identified. Sinuses/Orbits: Visualized paranasal sinuses and mastoids are stable and well pneumatized. Other: Visualized orbits and scalp soft tissues are within normal limits. IMPRESSION: 1. Re-demonstrated small volume posterior fossa subdural hematoma (trace by CT) as seen by MRI yesterday. This is intermediate density, and there is no associated mass effect. 2. No skull fracture identified and otherwise negative noncontrast CT appearance of the brain. Electronically Signed   By: Genevie Ann M.D.   On: 07/04/2018  18:07   Mr Brain Wo Contrast  Result Date: 07/05/2018 CLINICAL DATA:  Follow-up posterior fossa and upper cervical subdural hematoma. EXAM: MRI HEAD WITHOUT CONTRAST TECHNIQUE: Multiplanar, multiecho pulse sequences of the brain and surrounding structures were obtained without intravenous contrast. COMPARISON:  07/03/2018 MRI.  07/04/2018 CT. FINDINGS: Brain: T1 imaging is performed for evaluation of the subdural. The examination is precisely unchanged. Subdural blood along the floor of the posterior fossa remains visible, maximal thickness 4 mm, without mass effect. Subdural blood along the anterior aspect of the foramen magnum and upper cervical spine is unchanged, extending down to the level of the L2-3 disc. Maximal thickness at the C2 level is 5 mm. Vascular: No abnormal vascular finding. Skull and upper cervical spine: Negative Sinuses/Orbits: Clear/normal Other: None IMPRESSION: No change. Stable amount and appearance of subdural blood along the floor of the posterior fossa and the ventral foramen magnum and upper spinal canal. Electronically Signed   By: Nelson Chimes M.D.   On: 07/05/2018 11:31   Mr Jeri Cos RS Contrast  Addendum Date: 07/03/2018   ADDENDUM REPORT: 07/03/2018 21:06 ADDENDUM: Study discussed by telephone with Dr. Antony Contras (covering for Provider AMY Steuben ) on 07/03/2018 at 2053 hours. Electronically Signed   By: Genevie Ann M.D.   On: 07/03/2018 21:06   Result Date: 07/03/2018 CLINICAL DATA:  63 year old female with seizure-like activity. The patient has an MRI compatible pacemaker, with recommended conditions followed and no adverse events. EXAM: MRI HEAD WITHOUT AND WITH CONTRAST TECHNIQUE: Multiplanar, multiecho pulse sequences of the brain and surrounding structures were obtained without and with intravenous contrast. CONTRAST:  10 milliliters Gadavist COMPARISON:  Head CT 09/13/2016 and earlier. FINDINGS: Brain: There is a small volume of subdural hemorrhage in the posterior fossa  along the undersurface of the cerebellum (series 9, image 16 on the left and image 7 on the right) which appears new since 2018. Superimposed similar small volume of subdural blood along the ventral cisterna magna and in the upper cervical spine on series 9, image 11. The blood products at each of the sites measures 3-5 millimeters in thickness there is no significant mass effect. Otherwise there are occasional chronic micro hemorrhages in the brain (series 14, image 29), but no other acute intracranial hemorrhage. No superimposed restricted diffusion to suggest acute infarction. No midline shift, mass effect, ventriculomegaly. No intraventricular blood or debris. There is abnormal intrinsic T1 hyperintensity in the bilateral globus pallidus, most apparent on sagittal T1 (series 9, image 9). Otherwise gray and white matter signal is largely normal for age throughout the brain, no cortical encephalomalacia identified. The deep gray matter nuclei and brainstem appear normal. Thin slice imaging of the hippocampal formations appears symmetric and within normal limits. No abnormal enhancement identified. No dural thickening identified. Pituitary within normal limits. Vascular: Major intracranial vascular flow voids are preserved. There is mild generalized intracranial artery tortuosity. The left vertebral artery appears mildly  dominant. The major dural venous sinuses are enhancing and appear to be patent. Skull and upper cervical spine: Aside from the subdural hemorrhage dorsal to the C2 vertebra (series 9, image 11), the visible upper cervical spine appears normal. Visualized bone marrow signal is within normal limits. Sinuses/Orbits: Negative orbits.  Paranasal sinuses are clear. Other: Mastoids are clear. Visible internal auditory structures appear normal. Scalp and face soft tissues appear negative. IMPRESSION: 1. Unexpected finding of small volume subdural hematoma in the posterior fossa, at the ventral cisterna  magna, and in the upper cervical spine (series 9, image 11). Query recent trauma and/or coagulopathy. There is no significant mass effect. 2. Intrinsic increased T1 signal in the globus pallidus which can be seen in the setting of hepatic insufficiency, cirrhosis, portosystemic shunt, portal vein thrombosis, total parenteral nutrition, and is less often seen with hyperglycemia or other metabolic disturbances such as Wilson's disease. Correlation with serum ammonia levels and liver function tests is recommended. 3. Otherwise largely normal for age brain parenchyma; there are occasional chronic micro hemorrhages. Electronically Signed: By: Genevie Ann M.D. On: 07/03/2018 20:12   EEG This is a normal awake electroencephalogram. There are no focal lateralizing or epileptiform features.  PHYSICAL EXAM  Temp:  [97.7 F (36.5 C)-98.4 F (36.9 C)] 97.8 F (36.6 C) (07/03 1617) Pulse Rate:  [70-83] 83 (07/03 1617) Resp:  [16-17] 16 (07/03 1617) BP: (123-149)/(63-95) 143/81 (07/03 1617) SpO2:  [95 %-99 %] 98 % (07/03 0802) Weight:  [99 kg] 99 kg (07/03 0900)  General - Well nourished, well developed, in no apparent distress.  Ophthalmologic - fundi not visualized due to noncooperation.  Cardiovascular - Regular rate and rhythm.  Mental Status -  Level of arousal and orientation to time, place, and person were intact. Language including expression, naming, repetition, comprehension was assessed and found intact. Attention span and concentration were normal. Recent and remote memory were intact. Fund of Knowledge was assessed and was intact.  Cranial Nerves II - XII - II - Visual field intact OU. III, IV, VI - Extraocular movements intact. V - Facial sensation intact bilaterally. VII - Facial movement intact bilaterally. VIII - Hearing & vestibular intact bilaterally. X - Palate elevates symmetrically. XI - Chin turning & shoulder shrug intact bilaterally. XII - Tongue protrusion  intact.  Motor Strength - The patient's strength was normal in all extremities and pronator drift was absent.  Bulk was normal and fasciculations were absent.   Motor Tone - Muscle tone was assessed at the neck and appendages and was normal.  Reflexes - The patient's reflexes were symmetrical in all extremities and she had no pathological reflexes.  Sensory - Light touch, temperature/pinprick were assessed and were symmetrical.    Coordination - The patient had normal movements in the hands and feet with no ataxia or dysmetria.  Tremor was absent.  Gait and Station - deferred.   ASSESSMENT/PLAN [name] is a 62 y.o. female with history of MVR on Coumadin, hypothyroidism, HTN, HLD, seizures on sonogram and presumed ocular migraines recently seen at [facility] where an MRI showed a small collection of SDH in the posterior fossa.  Noted to be very dizzy.  Sent to the ED with SDH and seizure-like activity.  SDH - small bilateral posterior fossa and upper cervical SDH, could be related to Coumadin use  Neurosurgery on board  MRI 7/1 unexpected small volume posterior fossa SDH without mass-effect.    CT 7/2 head small volume posterior fossa  SDH with no mass-effect.   MRI 7/3 sagittal T1 sequence only stable SDH  IV heparin for VTE prophylaxis  warfarin daily prior to admission therapeutic at 2.7  Received FFP and vitamin K on 7 2, INR down to 1.5  now on heparin IV after agreement with neurosurgery  Further recommendation according to Dr. Leonie Man in a.m.  Seizure-like activity - ? migraine equivalent ?   Ocular migraine after mechanical valve placement in 04/2016, stopped in 09/2017  However developed frequent episode of eyes disconjugate, not able move, talk, but no LOC  Initially responding to Zonegran, now normal effective  Following with Dr. Felecia Shelling at [facility]  May consider add further AEDs such as Keppra, Vimpat or Lamictal  Recommend to avoid  Tegretol, Dilantin, Depakote due to drug drug interaction with Coumadin  Further recommendation per Dr. Leonie Man in a.m.  EEG negative  Mechanical heart valve  Placed in 04/2016  On Coumadin at home  Currently Coumadin on hold due to SDH  On heparin IV per stroke protocol  Close neuro checks  Hypertension  Highest recorded blood pressure 149/95  Home meds including metoprolol   Stable . 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 Sater and Sethi at [facility] day # 0  I spent  35 minutes in total face-to-face time with the patient, more than 50% of which was spent in counseling and coordination of care, reviewing test results, images and medication, and discussing the diagnosis of SDH, mechanical valve, seizure-like activity, AV block with pacemaker, treatment plan and potential prognosis. This patient's care requiresreview of multiple databases, neurological assessment, discussion with family, other specialists and medical decision making of high complexity.  I have discussed with Dr. Kathyrn Sheriff and Dr. Wyline Copas.   Rosalin Hawking, MD PhD Stroke Neurology 07/05/2018 6:33 PM    To contact Stroke Continuity provider, please refer to [email or web address] After hours, contact General Neurology

## 2018-07-05 NOTE — Progress Notes (Signed)
Patient call out for a nurse. She was having a seizure like episode . She was turn on her left side in a fetal position, eye closed. She could hear [place] but she she could not response. She was flushed in the faced. This lasted about 3 mins. VSS  Patient began to come around and was able to answer all orientation questions. Still has c/o vision disturbance. She is flashes in her eyes MD notified, orders to continue to monitor

## 2018-07-05 NOTE — Procedures (Signed)
ELECTROENCEPHALOGRAM REPORT   Patient: [name]       Room #: 2G31D EEG No. ID: [id number] Age: 63 y.o.        Sex: female Referring Physician: Wyline Copas Report Date:  07/05/2018        Interpreting Physician: Alexis Goodell  History: [name] is an 63 y.o. female with SDH and seizure-like activity  Medications:  Synthroid, Cozaar, Toprol, Effexor, Zonegran  Conditions of Recording:  This is a 21 channel routine scalp EEG performed with bipolar and monopolar montages arranged in accordance to the international 10/20 system of electrode placement. One channel was dedicated to EKG recording.  The patient is in the awake state.  Description:  The waking background activity consists of a low voltage, symmetrical, fairly well organized, 10 Hz alpha activity, seen from the parieto-occipital and posterior temporal regions.  Low voltage fast activity, poorly organized, is seen anteriorly and is at times superimposed on more posterior regions.  A mixture of theta and alpha rhythms are seen from the central and temporal regions. The patient does not drowse or sleep. No epileptiform activity is noted.   Hyperventilation and intermittent photic stimulation were not performed.  IMPRESSION: This is a normal awake electroencephalogram. There are no focal lateralizing or epileptiform features.   Alexis Goodell, MD Neurology [phone] 07/05/2018, 10:47 AM

## 2018-07-05 NOTE — Progress Notes (Signed)
PROGRESS NOTE    [name]  OMV:[id number] DOB: [birth year] DOA: 07/04/2018 PCP: Biagio Borg, MD    Brief Narrative:  63 y.o. 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  Patient denies any fall or trauma.  Several months ago, patient began to experience recurrent and brief episodes of visual disturbance and has been following with neurology for this.  More recently, she has developed mild frontal headache and episodes of dizziness and uncontrolled eye movements.  This was evaluated with outpatient MRI which revealed a small volume subdural hematoma in the posterior fossa at the ventral cisterna magna and in the upper cervical spine, as well as increased T1 signal in the globus pallidus.  She was directed to the ED for further evaluation of these findings.  Patient denies any recent fevers, chills, cough, shortness of breath, or sick contacts.  ED Course: Upon arrival to the ED, patient is found to be afebrile, saturating well on room air, and with remaining vitals also normal.  Chemistry panel and CBC are unremarkable, and INR is 2.7.  CT head re-demonstrates the small fluid collection.  Neurosurgery evaluated the patient, there is no indication for intervention at this time, she is not felt to need rapid reversal of her INR, but vitamin K and FFP were recommended.  Neurology is also consulted on the patient and recommending EEG, repeat head CT, seizure precautions, and neuro checks.  Assessment & Plan:   Principal Problem:   Subdural hematoma (HCC) Active Problems:   Hypothyroidism   Essential hypertension   Other hypertrophic cardiomyopathy (HCC)   S/P mitral valve  replacement  1. Subdural fluid collection    - Presents with headaches and seizure-like activity, evaluated with outpatient MRI on 07/03/18 that revealed a small-volume subdural fluid collection in posterior fossa and upper c-spine  - Neurosurgery has evaluated her in ED, no need for intervention or rapid INR reversal, but recommended vit k and FFP  - Neurology following. Discussed case with Dr. Erlinda Hong. Given hx of MVR, will continue on heparin gtt for now and monitor closely for neurologic change. Neurology had discussed with Neurosurgery - MRI obtained this am personally reviewed. No change with stable amount and appearance of subdural blood  2. Seizure-like activity  - Appreciate neurology consultation - EEG performed and reviewed, no seizure - continue Zonegran    3. Mitral valve replacement  - INR 2.7 on admission, treated with FFP and vitamin K per neurosurgery recs, with repeat INR of 1.5 - She follows with [facility]  - Discussed with Neurology who spoke with Neurosurgery. Plan to continue on heparin gtt bridge for now  4. Hypertension  - BP at goal, continue losartan and metoprolol   - Stable at present  5. Depression  - Continue Effexor as tolerated  6. Hypothyroidism  - Continue Synthroid as tolerated  DVT prophylaxis: Heparin gtt Code Status: Full Family Communication: Pt in room, family not at bedside Disposition Plan: Uncertain at this time  Consultants:   Neurology  Neurosurgery  Procedures:     Antimicrobials: Anti-infectives (From admission, onward)   None       Subjective: Without complaints.   Objective: Vitals:   07/04/18 2355 07/05/18 0354 07/05/18 0802 07/05/18 0900  BP: 123/63 135/85 (!) 141/69  Pulse: 75 79 75   Resp: 16 16 16    Temp: 97.9 F (36.6 C) 97.7 F (36.5 C) 98.4 F (36.9 C)   TempSrc: Oral Oral Oral   SpO2: 96% 99% 98%   Weight:    99 kg  Height:    5\' 8"  (1.727 m)    Intake/Output Summary (Last 24 hours) at  07/05/2018 1419 Last data filed at 07/05/2018 0700 Gross per 24 hour  Intake 0 ml  Output --  Net 0 ml   Filed Weights   07/05/18 0900  Weight: 99 kg    Examination:  General exam: Appears calm and comfortable  Respiratory system: Clear to auscultation. Respiratory effort normal. Cardiovascular system: S1 & S2 heard, RRR Gastrointestinal system: Abdomen is nondistended, soft and nontender. No organomegaly or masses felt. Normal bowel sounds heard. Central nervous system: Alert and oriented. No focal neurological deficits. Extremities: Symmetric 5 x 5 power. Skin: No rashes, lesions Psychiatry: Judgement and insight appear normal. Mood & affect appropriate.   Data Reviewed: I have personally reviewed following labs and imaging studies  CBC: Recent Labs  Lab 07/04/18 1838 07/05/18 0421  WBC 5.6 5.5  NEUTROABS 3.2  --   HGB 12.1 12.7  HCT 35.8* 38.6  MCV 82.5 82.7  PLT 235 008   Basic Metabolic Panel: Recent Labs  Lab 07/03/18 1330 07/04/18 1838 07/05/18 0421  NA  --  139 142  K  --  3.6 4.2  CL  --  110 109  CO2  --  21* 24  GLUCOSE  --  99 115*  BUN  --  16 17  CREATININE 0.97 0.87 0.99  CALCIUM  --  8.5* 9.3   GFR: Estimated Creatinine Clearance: 71.5 mL/min (by C-G formula based on SCr of 0.99 mg/dL). Liver Function Tests: No results for input(s): AST, ALT, ALKPHOS, BILITOT, PROT, ALBUMIN in the last 168 hours. No results for input(s): LIPASE, AMYLASE in the last 168 hours. No results for input(s): AMMONIA in the last 168 hours. Coagulation Profile: Recent Labs  Lab 07/04/18 1838 07/05/18 0421  INR 2.7* 1.5*   Cardiac Enzymes: No results for input(s): CKTOTAL, CKMB, CKMBINDEX, TROPONINI in the last 168 hours. BNP (last 3 results) No results for input(s): PROBNP in the last 8760 hours. HbA1C: No results for input(s): HGBA1C in the last 72 hours. CBG: No results for input(s): GLUCAP in the last 168 hours. Lipid Profile: No results for input(s):  CHOL, HDL, LDLCALC, TRIG, CHOLHDL, LDLDIRECT in the last 72 hours. Thyroid Function Tests: No results for input(s): TSH, T4TOTAL, FREET4, T3FREE, THYROIDAB in the last 72 hours. Anemia Panel: No results for input(s): VITAMINB12, FOLATE, FERRITIN, TIBC, IRON, RETICCTPCT in the last 72 hours. Sepsis Labs: No results for input(s): PROCALCITON, LATICACIDVEN in the last 168 hours.  No results found for this or any previous visit (from the past 240 hour(s)).   Radiology Studies: Ct Head Wo Contrast  Result Date: 07/04/2018 CLINICAL DATA:  63 year old female found to have small volume posterior fossa subdural hematoma on routine outpatient MRI yesterday done for seizure like activity. EXAM: CT HEAD WITHOUT CONTRAST TECHNIQUE: Contiguous axial images were obtained from the base of the skull through the vertex without intravenous contrast. COMPARISON:  MRI 07/03/2018.  Head CT 09/13/2016. FINDINGS: Brain: Subtle CT evidence of the small volume posterior fossa hematoma seen by MRI yesterday (sagittal image 37). And as expected this is new from the 2018 CT. The blood is intermediate density. No associated posterior fossa mass  effect. No other intracranial hemorrhage identified. Gray-white matter differentiation is within normal limits throughout the brain. No ventriculomegaly. No cortically based acute infarct identified. Vascular: No suspicious intracranial vascular hyperdensity. Skull: Stable and intact.  No acute osseous abnormality identified. Sinuses/Orbits: Visualized paranasal sinuses and mastoids are stable and well pneumatized. Other: Visualized orbits and scalp soft tissues are within normal limits. IMPRESSION: 1. Re-demonstrated small volume posterior fossa subdural hematoma (trace by CT) as seen by MRI yesterday. This is intermediate density, and there is no associated mass effect. 2. No skull fracture identified and otherwise negative noncontrast CT appearance of the brain. Electronically Signed   By:  Genevie Ann M.D.   On: 07/04/2018 18:07   Mr Brain Wo Contrast  Result Date: 07/05/2018 CLINICAL DATA:  Follow-up posterior fossa and upper cervical subdural hematoma. EXAM: MRI HEAD WITHOUT CONTRAST TECHNIQUE: Multiplanar, multiecho pulse sequences of the brain and surrounding structures were obtained without intravenous contrast. COMPARISON:  07/03/2018 MRI.  07/04/2018 CT. FINDINGS: Brain: T1 imaging is performed for evaluation of the subdural. The examination is precisely unchanged. Subdural blood along the floor of the posterior fossa remains visible, maximal thickness 4 mm, without mass effect. Subdural blood along the anterior aspect of the foramen magnum and upper cervical spine is unchanged, extending down to the level of the L2-3 disc. Maximal thickness at the C2 level is 5 mm. Vascular: No abnormal vascular finding. Skull and upper cervical spine: Negative Sinuses/Orbits: Clear/normal Other: None IMPRESSION: No change. Stable amount and appearance of subdural blood along the floor of the posterior fossa and the ventral foramen magnum and upper spinal canal. Electronically Signed   By: Nelson Chimes M.D.   On: 07/05/2018 11:31   Mr Jeri Cos GQ Contrast  Addendum Date: 07/03/2018   ADDENDUM REPORT: 07/03/2018 21:06 ADDENDUM: Study discussed by telephone with Dr. Antony Contras (covering for Provider AMY Orange ) on 07/03/2018 at 2053 hours. Electronically Signed   By: Genevie Ann M.D.   On: 07/03/2018 21:06   Result Date: 07/03/2018 CLINICAL DATA:  63 year old female with seizure-like activity. The patient has an MRI compatible pacemaker, with recommended conditions followed and no adverse events. EXAM: MRI HEAD WITHOUT AND WITH CONTRAST TECHNIQUE: Multiplanar, multiecho pulse sequences of the brain and surrounding structures were obtained without and with intravenous contrast. CONTRAST:  10 milliliters Gadavist COMPARISON:  Head CT 09/13/2016 and earlier. FINDINGS: Brain: There is a small volume of subdural  hemorrhage in the posterior fossa along the undersurface of the cerebellum (series 9, image 16 on the left and image 7 on the right) which appears new since 2018. Superimposed similar small volume of subdural blood along the ventral cisterna magna and in the upper cervical spine on series 9, image 11. The blood products at each of the sites measures 3-5 millimeters in thickness there is no significant mass effect. Otherwise there are occasional chronic micro hemorrhages in the brain (series 14, image 29), but no other acute intracranial hemorrhage. No superimposed restricted diffusion to suggest acute infarction. No midline shift, mass effect, ventriculomegaly. No intraventricular blood or debris. There is abnormal intrinsic T1 hyperintensity in the bilateral globus pallidus, most apparent on sagittal T1 (series 9, image 9). Otherwise gray and white matter signal is largely normal for age throughout the brain, no cortical encephalomalacia identified. The deep gray matter nuclei and brainstem appear normal. Thin slice imaging of the hippocampal formations appears symmetric and within normal limits. No abnormal enhancement identified. No dural thickening identified. Pituitary within normal limits. Vascular:  Major intracranial vascular flow voids are preserved. There is mild generalized intracranial artery tortuosity. The left vertebral artery appears mildly dominant. The major dural venous sinuses are enhancing and appear to be patent. Skull and upper cervical spine: Aside from the subdural hemorrhage dorsal to the C2 vertebra (series 9, image 11), the visible upper cervical spine appears normal. Visualized bone marrow signal is within normal limits. Sinuses/Orbits: Negative orbits.  Paranasal sinuses are clear. Other: Mastoids are clear. Visible internal auditory structures appear normal. Scalp and face soft tissues appear negative. IMPRESSION: 1. Unexpected finding of small volume subdural hematoma in the posterior  fossa, at the ventral cisterna magna, and in the upper cervical spine (series 9, image 11). Query recent trauma and/or coagulopathy. There is no significant mass effect. 2. Intrinsic increased T1 signal in the globus pallidus which can be seen in the setting of hepatic insufficiency, cirrhosis, portosystemic shunt, portal vein thrombosis, total parenteral nutrition, and is less often seen with hyperglycemia or other metabolic disturbances such as Wilson's disease. Correlation with serum ammonia levels and liver function tests is recommended. 3. Otherwise largely normal for age brain parenchyma; there are occasional chronic micro hemorrhages. Electronically Signed: By: Genevie Ann M.D. On: 07/03/2018 20:12    Scheduled Meds:  levothyroxine  50 mcg Oral QAC breakfast   losartan  25 mg Oral Daily   metoprolol succinate  50 mg Oral Daily   sodium chloride flush  3 mL Intravenous Q12H   sodium chloride flush  3 mL Intravenous Q12H   venlafaxine XR  150 mg Oral Daily   zonisamide  200 mg Oral QHS   Continuous Infusions:  sodium chloride     heparin 1,000 Units/hr (07/05/18 1220)     LOS: 0 days   Marylu Lund, MD [facility] Pager On Amion  If 7PM-7AM, please contact night-coverage 07/05/2018, 2:19 PM

## 2018-07-05 NOTE — Progress Notes (Signed)
Pt NPO for CT Scan  Made aware and pt  demonstratedunderstanding

## 2018-07-05 NOTE — Progress Notes (Signed)
EEG complete - results pending 

## 2018-07-05 NOTE — Progress Notes (Signed)
[facility] for warfarin Indication: mechanical valve  Allergies  Allergen Reactions  . Fish Allergy Anaphylaxis and Swelling    Scaled fish, like flounder  . Penicillins Anaphylaxis and Hives    Did it involve swelling of the face/tongue/throat, SOB, or low BP? Yes Did it involve sudden or severe rash/hives, skin peeling, or any reaction on the inside of your mouth or nose? Unk Did you need to seek medical attention at a hospital or doctor's office? No When did it last happen? "I was 63 years old" If all above answers are "NO", may proceed with cephalosporin use.   [name] [Levofloxacin In D5w] Nausea And Vomiting  . Latex Dermatitis and Rash    If worn for more than a day, this causes blisters     Patient Measurements: Height: 5\' 8"  (172.7 cm) Weight: 218 lb 4.1 oz (99 kg) IBW/kg (Calculated) : 63.9  Vital Signs: Temp: 97.8 F (36.6 C) (07/03 1617) Temp Source: Oral (07/03 1617) BP: 143/81 (07/03 1617) Pulse Rate: 83 (07/03 1617)  Labs: Recent Labs    07/03/18 1330 07/04/18 1838 07/05/18 0421 07/05/18 1514  HGB  --  12.1 12.7  --   HCT  --  35.8* 38.6  --   PLT  --  235 237  --   LABPROT  --  28.5* 18.0*  --   INR  --  2.7* 1.5*  --   HEPARINUNFRC  --   --   --  <0.10*  CREATININE 0.97 0.87 0.99  --     Estimated Creatinine Clearance: 71.5 mL/min (by C-G formula based on SCr of 0.99 mg/dL).   Medical History: Past Medical History:  Diagnosis Date  . Abdominal pain, epigastric 10/25/2007  . ADD 01/20/2009  . ALLERGIC RHINITIS 10/15/2006  . ANGIOEDEMA 08/12/2008  . ANXIETY 10/15/2006  . ASTHMATIC BRONCHITIS, ACUTE 01/27/2008  . Cellulitis and abscess of leg, except foot 08/12/2007  . CELLULITIS, [place] 12/21/2008  . CHEST PAIN 06/14/2007  . Chronic anticoagulation 05/16/2016  . Chronic sinus infection 04/12/2010  . COLECTOMY, PARTIAL, WITH ANASTOMOSIS, HX OF 10/15/2006  . COLONIC POLYPS, HX OF 10/15/2006  . CYST, OVARIAN  NEC/NOS 10/15/2006  . Dengue 02/22/2010  . DVT, HX OF 10/15/2006  . DYSAUTONOMIA 10/15/2006  . Dysfunction of eustachian tube 05/26/2009  . GI BLEEDING 04/04/2007  . Glossitis 03/06/2008  . HEADACHE, CHRONIC 04/04/2007  . Heart murmur   . Hematemesis 10/25/2007  . HEMORRHOIDS 04/04/2007  . HYPERLIPIDEMIA 12/12/2007  . HYPERTENSION 10/15/2006  . HYPOTHYROIDISM 03/06/2008  . IBS 04/04/2007  . MASTECTOMY, BILATERAL, HX OF 08/12/2009  . NECK MASS 07/01/2007  . NEOP, MALIGNANT, FEMALE BREAST NOS 10/15/2006  . 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 SYNCOPE 07/01/2007  . Wheezing 08/20/2008  . WOUND, OPEN, LEG, WITHOUT COMPLICATION 9/83/3825   Assessment: 63 yof presented to the ED with dizziness and a mild headache. Initially  thought to have an SDH but per discussions with neurology, unclear if this is truly an SDH. She is on chronic warfarin for a history of a mechanical valve. INR is therapeutic on admission at 2.7 - received vitamin K 10 mg x1 on 7/2.   INR is 1.5. Hgb 12.7, plt 237. Discussed with neurology and neurosurgery, okay to start heparin infusion given subtherapeutic INR in setting of heart valve. Last dose warfarin 7/2.    Heparin drip start delayed, heparin level drawn only 3 hours after start. Will redraw heparin level and adjust at that time.  Goal of Therapy:  Heparin level: 0.3-0.5 INR 2.5-3.5 Monitor platelets by anticoagulation protocol: Yes   Plan:  Hold warfarin Continue Heparin infusion at 1000 units/hr (no bolus) Order heparin level in 3 hours Monitor daily INR, CBC, HL, and s/sx of bleeding   Banjamin Stovall A. Levada Dy, PharmD, [place] Pager: [phone] Please utilize Amion for appropriate phone number to reach the unit pharmacist ([place])   07/05/2018,4:25 PM

## 2018-07-05 NOTE — Progress Notes (Signed)
Called by RN around 6 PM stated that patient had another episode of not talking, fetal position, blurry vision, lasting about 3 to 5 minutes.  Patient was able to interact communicate through short words and body language with RN during the episode.  Currently resolved.  Patient back to baseline as per RN.  Will let Dr. Leonie Man know to further manage in a.m.  Rosalin Hawking, MD PhD Stroke Neurology 07/05/2018 7:51 PM

## 2018-07-05 NOTE — Progress Notes (Signed)
[facility] for warfarin Indication: mechanical valve  Allergies  Allergen Reactions  . Fish Allergy Anaphylaxis and Swelling    Scaled fish, like flounder  . Penicillins Anaphylaxis and Hives    Did it involve swelling of the face/tongue/throat, SOB, or low BP? Yes Did it involve sudden or severe rash/hives, skin peeling, or any reaction on the inside of your mouth or nose? Unk Did you need to seek medical attention at a hospital or doctor's office? No When did it last happen? "I was 63 years old" If all above answers are "NO", may proceed with cephalosporin use.   [name] [Levofloxacin In D5w] Nausea And Vomiting  . Latex Dermatitis and Rash    If worn for more than a day, this causes blisters     Patient Measurements: Height: 5\' 8"  (172.7 cm) Weight: 218 lb 4.1 oz (99 kg) IBW/kg (Calculated) : 63.9  Vital Signs: Temp: 98.4 F (36.9 C) (07/03 0802) Temp Source: Oral (07/03 0802) BP: 141/69 (07/03 0802) Pulse Rate: 75 (07/03 0802)  Labs: Recent Labs    07/03/18 1330 07/04/18 1838 07/05/18 0421  HGB  --  12.1 12.7  HCT  --  35.8* 38.6  PLT  --  235 237  LABPROT  --  28.5* 18.0*  INR  --  2.7* 1.5*  CREATININE 0.97 0.87 0.99    Estimated Creatinine Clearance: 71.5 mL/min (by C-G formula based on SCr of 0.99 mg/dL).   Medical History: Past Medical History:  Diagnosis Date  . Abdominal pain, epigastric 10/25/2007  . ADD 01/20/2009  . ALLERGIC RHINITIS 10/15/2006  . ANGIOEDEMA 08/12/2008  . ANXIETY 10/15/2006  . ASTHMATIC BRONCHITIS, ACUTE 01/27/2008  . Cellulitis and abscess of leg, except foot 08/12/2007  . CELLULITIS, [place] 12/21/2008  . CHEST PAIN 06/14/2007  . Chronic anticoagulation 05/16/2016  . Chronic sinus infection 04/12/2010  . COLECTOMY, PARTIAL, WITH ANASTOMOSIS, HX OF 10/15/2006  . COLONIC POLYPS, HX OF 10/15/2006  . CYST, OVARIAN NEC/NOS 10/15/2006  . Dengue 02/22/2010  . DVT, HX OF 10/15/2006  .  DYSAUTONOMIA 10/15/2006  . Dysfunction of eustachian tube 05/26/2009  . GI BLEEDING 04/04/2007  . Glossitis 03/06/2008  . HEADACHE, CHRONIC 04/04/2007  . Heart murmur   . Hematemesis 10/25/2007  . HEMORRHOIDS 04/04/2007  . HYPERLIPIDEMIA 12/12/2007  . HYPERTENSION 10/15/2006  . HYPOTHYROIDISM 03/06/2008  . IBS 04/04/2007  . MASTECTOMY, BILATERAL, HX OF 08/12/2009  . NECK MASS 07/01/2007  . NEOP, MALIGNANT, FEMALE BREAST NOS 10/15/2006  . 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 SYNCOPE 07/01/2007  . Wheezing 08/20/2008  . WOUND, OPEN, LEG, WITHOUT COMPLICATION 01/03/5850   Assessment: 63 yof presented to the ED with dizziness and a mild headache. Initially  thought to have an SDH but per discussions with neurology, unclear if this is truly an SDH. She is on chronic warfarin for a history of a mechanical valve. INR is therapeutic on admission at 2.7 - received vitamin K 10 mg x1 on 7/2.   INR is 1.5. Hgb 12.7, plt 237. Discussed with neurology and neurosurgery, okay to start heparin infusion given subtherapeutic INR in setting of heart valve. Last dose warfarin 7/2.    Goal of Therapy:  Heparin level: 0.3-0.5 INR 2.5-3.5 Monitor platelets by anticoagulation protocol: Yes   Plan:  Hold warfarin Start heparin infusion at 1000 units/hr (no bolus) Order heparin level in 6 hours Monitor daily INR, CBC, HL, and s/sx of bleeding   [name], PharmD, [place] Clinical Pharmacist  Pager: [phone] Phone: [phone] 07/05/2018,10:08 AM

## 2018-07-06 LAB — NOVEL CORONAVIRUS, NAA (HOSP ORDER, SEND-OUT TO REF LAB; TAT 18-24 HRS): SARS-CoV-2, NAA: NOT DETECTED

## 2018-07-06 LAB — CBC
HCT: 38.9 % (ref 36.0–46.0)
Hemoglobin: 13.2 g/dL (ref 12.0–15.0)
MCH: 27.7 pg (ref 26.0–34.0)
MCHC: 33.9 g/dL (ref 30.0–36.0)
MCV: 81.6 fL (ref 80.0–100.0)
Platelets: 228 10*3/uL (ref 150–400)
RBC: 4.77 MIL/uL (ref 3.87–5.11)
RDW: 14.3 % (ref 11.5–15.5)
WBC: 5.5 10*3/uL (ref 4.0–10.5)
nRBC: 0 % (ref 0.0–0.2)

## 2018-07-06 LAB — PROTIME-INR
INR: 1.2 (ref 0.8–1.2)
Prothrombin Time: 15 seconds (ref 11.4–15.2)

## 2018-07-06 LAB — HEPARIN LEVEL (UNFRACTIONATED): Heparin Unfractionated: 0.39 IU/mL (ref 0.30–0.70)

## 2018-07-06 MED ORDER — WARFARIN SODIUM 5 MG PO TABS
10.0000 mg | ORAL_TABLET | Freq: Once | ORAL | Status: AC
  Administered 2018-07-06: 10 mg via ORAL
  Filled 2018-07-06: qty 2

## 2018-07-06 MED ORDER — ZONISAMIDE 100 MG PO CAPS
300.0000 mg | ORAL_CAPSULE | Freq: Every day | ORAL | Status: DC
  Administered 2018-07-06 – 2018-07-13 (×8): 300 mg via ORAL
  Filled 2018-07-06 (×8): qty 3

## 2018-07-06 MED ORDER — WARFARIN - PHARMACIST DOSING INPATIENT
Freq: Every day | Status: DC
  Administered 2018-07-08 – 2018-07-12 (×4)

## 2018-07-06 NOTE — Progress Notes (Signed)
ANTICOAGULATION CONSULT NOTE - Follow Up Consult  Pharmacy Consult for Coumadin (and Heparin) Indication: mechanical valve  Allergies  Allergen Reactions  . Fish Allergy Anaphylaxis and Swelling    Scaled fish, like flounder  . Penicillins Anaphylaxis and Hives    Did it involve swelling of the face/tongue/throat, SOB, or low BP? Yes Did it involve sudden or severe rash/hives, skin peeling, or any reaction on the inside of your mouth or nose? Unk Did you need to seek medical attention at a hospital or doctor's office? No When did it last happen? "I was 63 years old" If all above answers are "NO", may proceed with cephalosporin use.   [name] [Levofloxacin In D5w] Nausea And Vomiting  . Latex Dermatitis and Rash    If worn for more than a day, this causes blisters     Patient Measurements: Height: 5\' 8"  (172.7 cm) Weight: 218 lb 4.1 oz (99 kg) IBW/kg (Calculated) : 63.9 Heparin Dosing Weight: 85.6 kg  Vital Signs: Temp: 97.6 F (36.4 C) (07/04 1700) Temp Source: Other (Comment) (07/04 1700) BP: 117/77 (07/04 1700) Pulse Rate: 64 (07/04 1700)  Labs: Recent Labs    07/04/18 1838 07/05/18 0421 07/05/18 1514 07/05/18 1838 07/06/18 0401  HGB 12.1 12.7  --   --  13.2  HCT 35.8* 38.6  --   --  38.9  PLT 235 237  --   --  228  LABPROT 28.5* 18.0*  --   --  15.0  INR 2.7* 1.5*  --   --  1.2  HEPARINUNFRC  --   --  <0.10* 0.20* 0.39  CREATININE 0.87 0.99  --   --   --     Estimated Creatinine Clearance: 71.5 mL/min (by C-G formula based on SCr of 0.99 mg/dL).  Assessment:  63 yof presented to the ED on 07/04/18 with dizziness and a mild headache. Initially thought to have an SDH but per discussions with neurology, unclear if this is truly an SDH. She is on chronic warfarin for a history of a mechanical valve. INR was therapeutic on admission at 2.7 - received Vitamin K 10 mg IV x 1 and FFP on 7/2.  Coumadin 15 mg x 1 also given on 7/2 pm, then held.  Heparin drip begun on  7/3 given subtherapeutic INR in setting of heart valve.    Coumadin to resume tonight per Dr. Earlie Counts discussed with Dr. Leonie Man.  INR down to 1.2.   Home Coumadin regimen:  10 mg daily.        Heparin level was at goal (0.39) this am on 1150 units/hr.  Goal of Therapy:  Heparin level 0.3-0.5 units/ml INR 2.5-3.5 Monitor platelets by anticoagulation protocol: Yes   Plan:   Will resume Coumadin conservatively with usual dose of 10 mg x 1 tonight.  Continue heparin drip at 1150 units/hr.  Daily heparin level, PT/INR and CBC.  Arty Baumgartner, [place] Pager: [phone] or phone: [phone] 07/06/2018,6:46 PM

## 2018-07-06 NOTE — Progress Notes (Signed)
Patient reported losing a crown on one of her front teeth while eating lunch. Patient placed crown in bag and placed in bedside. Nurse will continue to monitor. [place]

## 2018-07-06 NOTE — Progress Notes (Signed)
Husband called regarding patient's cardiologist from [facility]. Patient sees Dr. Jamse Arn at [phone] or [phone]. According to husband, Pilar Plate, Dr. Michaelle Birks would like to speak with patient's cardiologist here about her plan. Nurse will continue to monitor. [place]

## 2018-07-06 NOTE — Progress Notes (Signed)
STROKE TEAM PROGRESS NOTE   INTERVAL HISTORY Patient sitting in bed, no complaints.  She is still having some dizziness but it appears to be improving.  She is also having recurrent stereotypical episodes of vision disturbance with some speech difficulties followed by severe tiredness.  These occur 4-5 times per week.  She states that she noticed improvement after zonegran 100 mg daily was started but after a few weeks episode started coming back and when the dose was increased to 200 mg daily she again got some partial relief for a few weeks but no episodes were increased again.  She denies any headache before during or after the episodes She does follow-up with cardiologist Dr. Derinda Sis at [facility] for her mechanical heart valve  Vitals:   07/05/18 1946 07/06/18 0021 07/06/18 0457 07/06/18 0836  BP: (!) 148/93 (!) 145/86 122/74 138/78  Pulse: 76 75 66 69  Resp: 18 18 18 18   Temp: 98.5 F (36.9 C) 98.5 F (36.9 C) 98.3 F (36.8 C) 97.8 F (36.6 C)  TempSrc: Oral Oral Oral Oral  SpO2: 95% 97% 93% 98%  Weight:      Height:        CBC:  Recent Labs  Lab 07/04/18 1838 07/05/18 0421 07/06/18 0401  WBC 5.6 5.5 5.5  NEUTROABS 3.2  --   --   HGB 12.1 12.7 13.2  HCT 35.8* 38.6 38.9  MCV 82.5 82.7 81.6  PLT 235 237 676    Basic Metabolic Panel:  Recent Labs  Lab 07/04/18 1838 07/05/18 0421  NA 139 142  K 3.6 4.2  CL 110 109  CO2 21* 24  GLUCOSE 99 115*  BUN 16 17  CREATININE 0.87 0.99  CALCIUM 8.5* 9.3   Lipid Panel:     Component Value Date/Time   CHOL 209 (H) 12/13/2011 1244   TRIG 195.0 (H) 12/13/2011 1244   HDL 45.10 12/13/2011 1244   CHOLHDL 5 12/13/2011 1244   VLDL 39.0 12/13/2011 1244   LDLCALC 73 02/11/2008 1444   HgbA1c:  Lab Results  Component Value Date   HGBA1C  08/12/2007    5.2 (NOTE)   The ADA recommends the following therapeutic goal for glycemic   control related to Hgb A1C measurement:   Goal of Therapy:   < 7.0% Hgb A1C   Reference: American  Diabetes Association: Clinical Practice   Recommendations 2008, Diabetes Care,  2008, 31:(Suppl 1).   Urine Drug Screen: No results found for: LABOPIA, COCAINSCRNUR, LABBENZ, AMPHETMU, THCU, LABBARB  Alcohol Level No results found for: ETH  IMAGING  Ct Head Wo Contrast 07/04/2018 IMPRESSION:  1. Re-demonstrated small volume posterior fossa subdural hematoma (trace by CT) as seen by MRI yesterday. This is intermediate density, and there is no associated mass effect.  2. No skull fracture identified and otherwise negative noncontrast CT appearance of the brain.  Mr Brain Wo Contrast 07/05/2018 IMPRESSION:  No change. Stable amount and appearance of subdural blood along the floor of the posterior fossa and the ventral foramen magnum and upper spinal canal.     EEG This is a normal awake electroencephalogram. There are no focal lateralizing or epileptiform features.   PHYSICAL EXAM  Temp:  [97.8 F (36.6 C)-98.5 F (36.9 C)] 97.8 F (36.6 C) (07/04 0836) Pulse Rate:  [66-83] 69 (07/04 0836) Resp:  [16-18] 18 (07/04 0836) BP: (122-148)/(74-93) 138/78 (07/04 0836) SpO2:  [93 %-98 %] 98 % (07/04 0836)  General -pleasant middle-age Caucasian lady, in no apparent distress.  Ophthalmologic - fundi not visualized due to noncooperation.  Cardiovascular - Regular rate and rhythm. Lungs clear to auscultation.  Neurological Exam ;  Awake  Alert oriented x 3. Normal speech and language.eye movements full without nystagmus.fundi were not visualized. Vision acuity and fields appear normal. Hearing is normal. Palatal movements are normal. Face symmetric. Tongue midline. Normal strength, tone, reflexes and coordination. Normal sensation. Gait deferred.     ASSESSMENT/PLAN Ms. DEIRDRA HEUMANN is a 63 y.o. female with history of MVR on Coumadin, hypothyroidism, HTN, HLD, seizures on sonogram and presumed ocular migraines recently seen at [facility] where an MRI showed a  small collection of SDH in the posterior fossa.  Noted to be very dizzy.  Sent to the ED with SDH and seizure-like activity. Recurrent stereotypical episodes of vision disturbance along with speech disturbance followed by tiredness of unclear etiology complicated migraine versus complex partial seizures with partial response to Zonegran--EEG negative SDH - small bilateral posterior fossa and upper cervical SDH,   related to Coumadin use  Neurosurgery on board  MRI 7/1 unexpected small volume posterior fossa SDH without mass-effect.    CT 7/2 head small volume posterior fossa SDH with no mass-effect.   MRI 7/3 sagittal T1 sequence only stable SDH  IV heparin for VTE prophylaxis  warfarin daily prior to admission therapeutic at 2.7  Received FFP and vitamin K on 7 2, INR down to 1.5  now on heparin IV after agreement with neurosurgery  Further recommendation according to Dr. Leonie Man in a.m.  Seizure-like activity - ? migraine equivalent ?   Ocular migraine after mechanical valve placement in 04/2016, stopped in 09/2017  However developed frequent episode of eyes disconjugate, not able move, talk, but no LOC  Initially responding to Zonegran, now normal effective  Following with Dr. Felecia Shelling at [facility]  May consider add further AEDs such as Keppra, Vimpat or Lamictal  Recommend to avoid Tegretol, Dilantin, Depakote due to drug drug interaction with Coumadin  Further recommendation per Dr. Leonie Man in a.m.  EEG negative  Mechanical heart valve  Placed in 04/2016  On Coumadin at home  Currently Coumadin on hold due to SDH  On heparin IV per stroke protocol  Close neuro checks  Hypertension  Highest recorded blood pressure 149/95  Home meds including metoprolol   Stable . SBP goal < 140  Other Stroke Risk Factors  Obesity, Body mass index is 33.19 kg/m., recommend weight loss, diet and exercise as appropriate   Other Active Problems  Chronic headache  AVB s/p pacer -  compatible with MRI  Follow up with Drs. Sater and Leonie Man at [place] Dr Kathyrn Sheriff 07/04/2018 - "No intervention needed, cont to monitor exam"  07/05/18 - 6 PM another episode - not talking, fetal position, blurry vision, lasting about 3 to 5 minutes.    Hospital day # 1 Recommend increased dose of Zonegran to 300 mg daily and if episodes persist may consider adding Depakote later.  Long discussion with the patient and her husband over the phone as well as Dr. Sherrian Divers.  Recommend cardiology consult locally and discussion with her primary cardiologist at [facility] whether to restart warfarin or switch to Eliquis prior to discharge.  Unfortunately she presents therapeutic dilemma since she clearly needs anticoagulation and she is developed spontaneous small posterior fossa hemorrhage on warfarin and alternative medications like Eliquis have not yet been adequately studied with mechanical valve patients I spent  35 minutes in total face-to-face time with the patient,  more than 50% of which was spent in counseling and coordination of care, reviewing test results, images and medication, and discussing the diagnosis of SDH, mechanical valve, seizure-like activity versus complicated migraine, AV block with pacemaker, treatment plan and potential prognosis. This patient's care requiresreview of multiple databases, neurological assessment, discussion with family, other specialists and medical decision making of high complexity.  Antony Contras, MD   To contact Stroke Continuity provider, please refer to [email or web address] After hours, contact General Neurology

## 2018-07-06 NOTE — Progress Notes (Signed)
Neurosurgery Service Progress Note  Subjective: No acute events overnight, no new complaints this morning   Objective: Vitals:   07/05/18 1946 07/06/18 0021 07/06/18 0457 07/06/18 0836  BP: (!) 148/93 (!) 145/86 122/74 138/78  Pulse: 76 75 66 69  Resp: 18 18 18 18   Temp: 98.5 F (36.9 C) 98.5 F (36.9 C) 98.3 F (36.8 C) 97.8 F (36.6 C)  TempSrc: Oral Oral Oral Oral  SpO2: 95% 97% 93% 98%  Weight:      Height:       Temp (24hrs), Avg:98.2 F (36.8 C), Min:97.8 F (36.6 C), Max:98.5 F (36.9 C)  CBC Latest Ref Rng & Units 07/06/2018 07/05/2018 07/04/2018  WBC 4.0 - 10.5 K/uL 5.5 5.5 5.6  Hemoglobin 12.0 - 15.0 g/dL 13.2 12.7 12.1  Hematocrit 36.0 - 46.0 % 38.9 38.6 35.8(L)  Platelets 150 - 400 K/uL 228 237 235   BMP Latest Ref Rng & Units 07/05/2018 07/04/2018 07/03/2018  Glucose 70 - 99 mg/dL 115(H) 99 -  BUN 8 - 23 mg/dL 17 16 -  Creatinine 0.44 - 1.00 mg/dL 0.99 0.87 0.97  Sodium 135 - 145 mmol/L 142 139 -  Potassium 3.5 - 5.1 mmol/L 4.2 3.6 -  Chloride 98 - 111 mmol/L 109 110 -  CO2 22 - 32 mmol/L 24 21(L) -  Calcium 8.9 - 10.3 mg/dL 9.3 8.5(L) -    Intake/Output Summary (Last 24 hours) at 07/06/2018 1132 Last data filed at 07/06/2018 0900 Gross per 24 hour  Intake 1216.57 ml  Output -  Net 1216.57 ml    Current Facility-Administered Medications:  .  0.9 %  sodium chloride infusion, 250 mL, Intravenous, PRN, Opyd, Ilene Qua, MD .  acetaminophen (TYLENOL) tablet 650 mg, 650 mg, Oral, Q6H PRN **OR** acetaminophen (TYLENOL) suppository 650 mg, 650 mg, Rectal, Q6H PRN, Opyd, Ilene Qua, MD .  bisacodyl (DULCOLAX) EC tablet 5 mg, 5 mg, Oral, Daily PRN, Opyd, Timothy S, MD .  heparin ADULT infusion 100 units/mL (25000 units/284mL sodium chloride 0.45%), 1,150 Units/hr, Intravenous, Continuous, Dang, Thuy D, RPH, Last Rate: 11.5 mL/hr at 07/06/18 0903, 1,150 Units/hr at 07/06/18 0903 .  HYDROcodone-acetaminophen (NORCO/VICODIN) 5-325 MG per tablet 1-2 tablet, 1-2 tablet, Oral, Q4H  PRN, Opyd, Ilene Qua, MD .  levothyroxine (SYNTHROID) tablet 50 mcg, 50 mcg, Oral, QAC breakfast, Opyd, Ilene Qua, MD, 50 mcg at 07/06/18 0658 .  losartan (COZAAR) tablet 25 mg, 25 mg, Oral, Daily, Opyd, Ilene Qua, MD, 25 mg at 07/06/18 0901 .  metoprolol succinate (TOPROL-XL) 24 hr tablet 50 mg, 50 mg, Oral, Daily, Opyd, Ilene Qua, MD, 50 mg at 07/06/18 0901 .  ondansetron (ZOFRAN) tablet 4 mg, 4 mg, Oral, Q6H PRN **OR** ondansetron (ZOFRAN) injection 4 mg, 4 mg, Intravenous, Q6H PRN, Opyd, Ilene Qua, MD, 4 mg at 07/06/18 1117 .  polyethylene glycol (MIRALAX / GLYCOLAX) packet 17 g, 17 g, Oral, Daily PRN, Opyd, Timothy S, MD .  sodium chloride flush (NS) 0.9 % injection 3 mL, 3 mL, Intravenous, Q12H, Opyd, Ilene Qua, MD, 3 mL at 07/05/18 1221 .  sodium chloride flush (NS) 0.9 % injection 3 mL, 3 mL, Intravenous, Q12H, Opyd, Ilene Qua, MD, 3 mL at 07/05/18 1812 .  sodium chloride flush (NS) 0.9 % injection 3 mL, 3 mL, Intravenous, PRN, Opyd, Ilene Qua, MD .  venlafaxine XR (EFFEXOR-XR) 24 hr capsule 150 mg, 150 mg, Oral, Daily, Opyd, Ilene Qua, MD, 150 mg at 07/06/18 0901 .  zolpidem (AMBIEN) tablet 5 mg, 5  mg, Oral, QHS PRN, Opyd, Timothy S, MD .  zonisamide (ZONEGRAN) capsule 300 mg, 300 mg, Oral, QHS, Wyline Copas Orpah Melter, MD   Physical Exam: AOx3, PERRL, EOMI, FS, FCx4 with full strength  Assessment & Plan: 63 y.o. woman with p fossa SDH and mechanical valve.  -okay to continue therapeutic anticoagulation as needed, will sign off, call with concerns or questions  Judith Part  07/06/18 11:32 AM

## 2018-07-06 NOTE — Treatment Plan (Signed)
Discussed case with Dr. Leonie Man. OK to start coumadin tonight per Neurology. Will place pharmacy consult for coumadin

## 2018-07-06 NOTE — Progress Notes (Addendum)
PROGRESS NOTE    Jessica UPHAM  DSK:[id number] DOB: [birth year] DOA: 07/04/2018 PCP: Biagio Borg, MD    Brief Narrative:  63 y.o. 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  Patient denies any fall or trauma.  Several months ago, patient began to experience recurrent and brief episodes of visual disturbance and has been following with neurology for this.  More recently, she has developed mild frontal headache and episodes of dizziness and uncontrolled eye movements.  This was evaluated with outpatient MRI which revealed a small volume subdural hematoma in the posterior fossa at the ventral cisterna magna and in the upper cervical spine, as well as increased T1 signal in the globus pallidus.  She was directed to the ED for further evaluation of these findings.  Patient denies any recent fevers, chills, cough, shortness of breath, or sick contacts.  ED Course: Upon arrival to the ED, patient is found to be afebrile, saturating well on room air, and with remaining vitals also normal.  Chemistry panel and CBC are unremarkable, and INR is 2.7.  CT head re-demonstrates the small fluid collection.  Neurosurgery evaluated the patient, there is no indication for intervention at this time, she is not felt to need rapid reversal of her INR, but vitamin K and FFP were recommended.  Neurology is also consulted on the patient and recommending EEG, repeat head CT, seizure precautions, and neuro checks.  Assessment & Plan:   Principal Problem:   Subdural hematoma (HCC) Active Problems:   Hypothyroidism   Essential hypertension   Other hypertrophic cardiomyopathy (HCC)   S/P mitral valve replacement  1. Subdural fluid collection    - Presents with headaches and seizure-like activity, evaluated with outpatient MRI on 07/03/18 that revealed a small-volume subdural fluid collection in posterior fossa and upper c-spine  - Neurosurgery has evaluated her in ED, no need for intervention or rapid INR reversal, but recommended vit k and FFP  - Neurology following. Recently discussed case with Dr. Erlinda Hong. Given hx of MVR, will continue on heparin gtt for now and monitor closely for neurologic change. Neurology had discussed with Neurosurgery - MRI obtained this am personally reviewed. No change with stable amount and appearance of subdural blood - Discussed with Neurology today who recommended discussing with Cardiology regarding  2. Seizure-like activity  - Appreciate neurology consultation - EEG performed and reviewed - continue Zonegran, dose increased to 300mg   3. Mitral valve replacement  - INR 2.7 on admission, treated with FFP and vitamin K per neurosurgery recs, with repeat INR of 1.5 - She follows with [facility]  - Discussed with Neurology who spoke with Neurosurgery. Plan to continue on heparin gtt bridge for now - Discussed case with on-call Cardiologist who recommends coumadin as only available long-term anticoagulation for patient's specific valve. -Will defer resuming long-term anticoagulation to Neurology/Neurosurgery. -Thus far stable on heparin gtt  4. Hypertension  - BP at goal, continue losartan and metoprolol   - Remains stable at present  5. Depression  - Patient is continued on Effexor as tolerated  6. Hypothyroidism  - Patient to continue Synthroid as tolerated  DVT prophylaxis: Heparin gtt Code Status: Full Family Communication: Pt in room, family over phone Disposition Plan: Uncertain at this time  Consultants:  Neurology  Neurosurgery  Discussed case with Cardiology over phone  Procedures:     Antimicrobials: Anti-infectives (From admission,  onward)   None      Subjective: Blurry vision and having difficult time speaking  Objective: Vitals:   07/06/18 0021 07/06/18 0457 07/06/18 0836 07/06/18 1100  BP: (!) 145/86 122/74 138/78 119/75  Pulse: 75 66 69 70  Resp: 18 18 18 18   Temp: 98.5 F (36.9 C) 98.3 F (36.8 C) 97.8 F (36.6 C) 97.9 F (36.6 C)  TempSrc: Oral Oral Oral Oral  SpO2: 97% 93% 98% 97%  Weight:      Height:        Intake/Output Summary (Last 24 hours) at 07/06/2018 1423 Last data filed at 07/06/2018 0900 Gross per 24 hour  Intake 1216.57 ml  Output -  Net 1216.57 ml   Filed Weights   07/05/18 0900  Weight: 99 kg    Examination: General exam: Awake, laying in bed, in nad Respiratory system: Normal respiratory effort, no wheezing Cardiovascular system: regular rate, s1, s2 Gastrointestinal system: Soft, nondistended, positive BS Central nervous system: CN2-12 grossly intact, strength intact Extremities: Perfused, no clubbing Skin: Normal skin turgor, no notable skin lesions seen Psychiatry: Mood normal // no visual hallucinations   Data Reviewed: I have personally reviewed following labs and imaging studies  CBC: Recent Labs  Lab 07/04/18 1838 07/05/18 0421 07/06/18 0401  WBC 5.6 5.5 5.5  NEUTROABS 3.2  --   --   HGB 12.1 12.7 13.2  HCT 35.8* 38.6 38.9  MCV 82.5 82.7 81.6  PLT 235 237 664   Basic Metabolic Panel: Recent Labs  Lab 07/03/18 1330 07/04/18 1838 07/05/18 0421  NA  --  139 142  K  --  3.6 4.2  CL  --  110 109  CO2  --  21* 24  GLUCOSE  --  99 115*  BUN  --  16 17  CREATININE 0.97 0.87 0.99  CALCIUM  --  8.5* 9.3   GFR: Estimated Creatinine Clearance: 71.5 mL/min (by C-G formula based on SCr of 0.99 mg/dL). Liver Function Tests: No results for input(s): AST, ALT, ALKPHOS, BILITOT, PROT, ALBUMIN in the last 168 hours. No results for input(s): LIPASE, AMYLASE in the last 168 hours. No results for input(s): AMMONIA in the last 168 hours. Coagulation Profile:  Recent Labs  Lab 07/04/18 1838 07/05/18 0421 07/06/18 0401  INR 2.7* 1.5* 1.2   Cardiac Enzymes: No results for input(s): CKTOTAL, CKMB, CKMBINDEX, TROPONINI in the last 168 hours. BNP (last 3 results) No results for input(s): PROBNP in the last 8760 hours. HbA1C: No results for input(s): HGBA1C in the last 72 hours. CBG: No results for input(s): GLUCAP in the last 168 hours. Lipid Profile: No results for input(s): CHOL, HDL, LDLCALC, TRIG, CHOLHDL, LDLDIRECT in the last 72 hours. Thyroid Function Tests: No results for input(s): TSH, T4TOTAL, FREET4, T3FREE, THYROIDAB in the last 72 hours. Anemia Panel: No results for input(s): VITAMINB12, FOLATE, FERRITIN, TIBC, IRON, RETICCTPCT in the last 72 hours. Sepsis Labs: No results for input(s): PROCALCITON, LATICACIDVEN in the last 168 hours.  No results found for this or any previous visit (from the past 240 hour(s)).   Radiology Studies: Ct Head Wo Contrast  Result Date: 07/04/2018 CLINICAL DATA:  63 year old female found to have small volume posterior fossa subdural hematoma on routine outpatient MRI yesterday done for seizure like activity. EXAM: CT HEAD WITHOUT CONTRAST TECHNIQUE: Contiguous axial images were obtained from the base of  the skull through the vertex without intravenous contrast. COMPARISON:  MRI 07/03/2018.  Head CT 09/13/2016. FINDINGS: Brain: Subtle CT evidence of the small volume posterior fossa hematoma seen by MRI yesterday (sagittal image 37). And as expected this is new from the 2018 CT. The blood is intermediate density. No associated posterior fossa mass effect. No other intracranial hemorrhage identified. Gray-white matter differentiation is within normal limits throughout the brain. No ventriculomegaly. No cortically based acute infarct identified. Vascular: No suspicious intracranial vascular hyperdensity. Skull: Stable and intact.  No acute osseous abnormality identified. Sinuses/Orbits: Visualized paranasal  sinuses and mastoids are stable and well pneumatized. Other: Visualized orbits and scalp soft tissues are within normal limits. IMPRESSION: 1. Re-demonstrated small volume posterior fossa subdural hematoma (trace by CT) as seen by MRI yesterday. This is intermediate density, and there is no associated mass effect. 2. No skull fracture identified and otherwise negative noncontrast CT appearance of the brain. Electronically Signed   By: Genevie Ann M.D.   On: 07/04/2018 18:07   Mr Brain Wo Contrast  Result Date: 07/05/2018 CLINICAL DATA:  Follow-up posterior fossa and upper cervical subdural hematoma. EXAM: MRI HEAD WITHOUT CONTRAST TECHNIQUE: Multiplanar, multiecho pulse sequences of the brain and surrounding structures were obtained without intravenous contrast. COMPARISON:  07/03/2018 MRI.  07/04/2018 CT. FINDINGS: Brain: T1 imaging is performed for evaluation of the subdural. The examination is precisely unchanged. Subdural blood along the floor of the posterior fossa remains visible, maximal thickness 4 mm, without mass effect. Subdural blood along the anterior aspect of the foramen magnum and upper cervical spine is unchanged, extending down to the level of the L2-3 disc. Maximal thickness at the C2 level is 5 mm. Vascular: No abnormal vascular finding. Skull and upper cervical spine: Negative Sinuses/Orbits: Clear/normal Other: None IMPRESSION: No change. Stable amount and appearance of subdural blood along the floor of the posterior fossa and the ventral foramen magnum and upper spinal canal. Electronically Signed   By: Nelson Chimes M.D.   On: 07/05/2018 11:31    Scheduled Meds: . levothyroxine  50 mcg Oral QAC breakfast  . losartan  25 mg Oral Daily  . metoprolol succinate  50 mg Oral Daily  . sodium chloride flush  3 mL Intravenous Q12H  . sodium chloride flush  3 mL Intravenous Q12H  . venlafaxine XR  150 mg Oral Daily  . zonisamide  300 mg Oral QHS   Continuous Infusions: . sodium chloride     . heparin 1,150 Units/hr (07/06/18 0903)     LOS: 1 day   Marylu Lund, MD [facility] Pager On Amion  If 7PM-7AM, please contact night-coverage 07/06/2018, 2:23 PM

## 2018-07-06 NOTE — Progress Notes (Signed)
ANTICOAGULATION CONSULT NOTE  Pharmacy Consult:  Heparin Indication: mechanical valve  Allergies  Allergen Reactions  . Fish Allergy Anaphylaxis and Swelling    Scaled fish, like flounder  . Penicillins Anaphylaxis and Hives    Did it involve swelling of the face/tongue/throat, SOB, or low BP? Yes Did it involve sudden or severe rash/hives, skin peeling, or any reaction on the inside of your mouth or nose? Unk Did you need to seek medical attention at a hospital or doctor's office? No When did it last happen? "I was 63 years old" If all above answers are "NO", may proceed with cephalosporin use.   [name] [Levofloxacin In D5w] Nausea And Vomiting  . Latex Dermatitis and Rash    If worn for more than a day, this causes blisters     Patient Measurements: Height: 5\' 8"  (172.7 cm) Weight: 218 lb 4.1 oz (99 kg) IBW/kg (Calculated) : 63.9  Vital Signs: Temp: 98.3 F (36.8 C) (07/04 0457) Temp Source: Oral (07/04 0457) BP: 122/74 (07/04 0457) Pulse Rate: 66 (07/04 0457)  Labs: Recent Labs    07/03/18 1330  07/04/18 1838 07/05/18 0421 07/05/18 1514 07/05/18 1838 07/06/18 0401  HGB  --    < > 12.1 12.7  --   --  13.2  HCT  --   --  35.8* 38.6  --   --  38.9  PLT  --   --  235 237  --   --  228  LABPROT  --   --  28.5* 18.0*  --   --  15.0  INR  --   --  2.7* 1.5*  --   --  1.2  HEPARINUNFRC  --   --   --   --  <0.10* 0.20* 0.39  CREATININE 0.97  --  0.87 0.99  --   --   --    < > = values in this interval not displayed.    Estimated Creatinine Clearance: 71.5 mL/min (by C-G formula based on SCr of 0.99 mg/dL).   Assessment: 63 yof presented to the ED with dizziness and a mild headache. Initially thought to have an SDH but per discussions with neurology, unclear if this is truly an SDH. She is on chronic warfarin for a history of a mechanical valve. INR is therapeutic on admission at 2.7 - received vitamin K 10 mg x1 on 7/2.   Discussed with neurology and  neurosurgery, okay to start heparin infusion given subtherapeutic INR in setting of heart valve.   Heparin level is therapeutic this AM CBC stable  Goal of Therapy:  Heparin level: 0.3-0.5 units/mL INR 2.5-3.5 Monitor platelets by anticoagulation protocol: Yes   Plan:  Continue heparin at 1150 units / hr Daily heparin level, CBC  Thank you Anette Guarneri, PharmD [phone] 07/06/2018, 7:32 AM

## 2018-07-06 NOTE — Progress Notes (Signed)
I discussed this case with Dr. Erlinda Hong (neurology) and we reviewed the risks/benefits of restarting AC. Although under normal circumstances we would keep any patient with ICH off AC, in this case it seems her risk of further hemorrhage is quite low given the stability of the scans and the small amount of blood and is probably outweighed by risk of stroke without AC because of her valve. I did also review the most recent MRI which also is stable.  Would therefore cont to monitor neurologic exam, stop heparin if any change and repeat head ct.

## 2018-07-07 LAB — HEPARIN LEVEL (UNFRACTIONATED): Heparin Unfractionated: 0.47 IU/mL (ref 0.30–0.70)

## 2018-07-07 LAB — PREPARE FRESH FROZEN PLASMA: Unit division: 0

## 2018-07-07 LAB — BPAM FFP
Blood Product Expiration Date: 202007052359
ISSUE DATE / TIME: 202007042318
Unit Type and Rh: 6200

## 2018-07-07 LAB — CBC
HCT: 39.5 % (ref 36.0–46.0)
Hemoglobin: 12.7 g/dL (ref 12.0–15.0)
MCH: 26.8 pg (ref 26.0–34.0)
MCHC: 32.2 g/dL (ref 30.0–36.0)
MCV: 83.5 fL (ref 80.0–100.0)
Platelets: 228 10*3/uL (ref 150–400)
RBC: 4.73 MIL/uL (ref 3.87–5.11)
RDW: 14.4 % (ref 11.5–15.5)
WBC: 5.3 10*3/uL (ref 4.0–10.5)
nRBC: 0 % (ref 0.0–0.2)

## 2018-07-07 LAB — PROTIME-INR
INR: 1.2 (ref 0.8–1.2)
Prothrombin Time: 14.6 seconds (ref 11.4–15.2)

## 2018-07-07 MED ORDER — WARFARIN SODIUM 5 MG PO TABS
10.0000 mg | ORAL_TABLET | Freq: Once | ORAL | Status: AC
  Administered 2018-07-07: 10 mg via ORAL
  Filled 2018-07-07: qty 2

## 2018-07-07 NOTE — Progress Notes (Signed)
STROKE TEAM PROGRESS NOTE   INTERVAL HISTORY Patient sitting in bed, no complaints.  She states that she is feeling better today and dizziness is improved.  She was started on warfarin last night INR this morning is 1.2.  She has no new complaints.  Vitals:   07/06/18 1950 07/06/18 2254 07/07/18 0025 07/07/18 0449  BP: 126/76 135/78 116/67 103/65  Pulse: 69 64 77 69  Resp: 16  16 16   Temp: (!) 97.5 F (36.4 C) 98 F (36.7 C) 98.6 F (37 C) 98 F (36.7 C)  TempSrc: Oral Oral Oral Oral  SpO2: 96% 96% 97% 99%  Weight:      Height:        CBC:  Recent Labs  Lab 07/04/18 1838  07/06/18 0401 07/07/18 0651  WBC 5.6   < > 5.5 5.3  NEUTROABS 3.2  --   --   --   HGB 12.1   < > 13.2 12.7  HCT 35.8*   < > 38.9 39.5  MCV 82.5   < > 81.6 83.5  PLT 235   < > 228 228   < > = values in this interval not displayed.    Basic Metabolic Panel:  Recent Labs  Lab 07/04/18 1838 07/05/18 0421  NA 139 142  K 3.6 4.2  CL 110 109  CO2 21* 24  GLUCOSE 99 115*  BUN 16 17  CREATININE 0.87 0.99  CALCIUM 8.5* 9.3   Lipid Panel:     Component Value Date/Time   CHOL 209 (H) 12/13/2011 1244   TRIG 195.0 (H) 12/13/2011 1244   HDL 45.10 12/13/2011 1244   CHOLHDL 5 12/13/2011 1244   VLDL 39.0 12/13/2011 1244   LDLCALC 73 02/11/2008 1444   HgbA1c:  Lab Results  Component Value Date   HGBA1C  08/12/2007    5.2 (NOTE)   The ADA recommends the following therapeutic goal for glycemic   control related to Hgb A1C measurement:   Goal of Therapy:   < 7.0% Hgb A1C   Reference: American Diabetes Association: Clinical Practice   Recommendations 2008, Diabetes Care,  2008, 31:(Suppl 1).   Urine Drug Screen: No results found for: LABOPIA, COCAINSCRNUR, LABBENZ, AMPHETMU, THCU, LABBARB  Alcohol Level No results found for: ETH  IMAGING  Ct Head Wo Contrast 07/04/2018 IMPRESSION:  1. Re-demonstrated small volume posterior fossa subdural hematoma (trace by CT) as seen by MRI yesterday. This is  intermediate density, and there is no associated mass effect.  2. No skull fracture identified and otherwise negative noncontrast CT appearance of the brain.  Mr Brain Wo Contrast 07/05/2018 IMPRESSION:  No change. Stable amount and appearance of subdural blood along the floor of the posterior fossa and the ventral foramen magnum and upper spinal canal.     EEG This is a normal awake electroencephalogram. There are no focal lateralizing or epileptiform features.   PHYSICAL EXAM  Temp:  [97.5 F (36.4 C)-98.6 F (37 C)] 98 F (36.7 C) (07/05 0449) Pulse Rate:  [64-77] 69 (07/05 0449) Resp:  [16-18] 16 (07/05 0449) BP: (103-135)/(65-78) 103/65 (07/05 0449) SpO2:  [96 %-99 %] 99 % (07/05 0449)  General -pleasant middle-age Caucasian lady, in no apparent distress.  Ophthalmologic - fundi not visualized due to noncooperation.  Cardiovascular - Regular rate and rhythm. Lungs clear to auscultation.  Neurological Exam ;  Awake  Alert oriented x 3. Normal speech and language.eye movements full without nystagmus.fundi were not visualized. Vision acuity and fields appear normal.  Hearing is normal. Palatal movements are normal. Face symmetric. Tongue midline. Normal strength, tone, reflexes and coordination. Normal sensation. Gait deferred.     ASSESSMENT/PLAN Ms. [name] is a 63 y.o. female with history of MVR on Coumadin, hypothyroidism, HTN, HLD, seizures on sonogram and presumed ocular migraines recently seen at [facility] where an MRI showed a small collection of SDH in the posterior fossa.  Noted to be very dizzy.  Sent to the ED with SDH and seizure-like activity. Recurrent stereotypical episodes of vision disturbance along with speech disturbance followed by tiredness of unclear etiology complicated migraine versus complex partial seizures with partial response to Zonegran--EEG negative SDH - small bilateral posterior fossa and upper cervical SDH, related  to Coumadin use  Neurosurgery on board  MRI 7/1 unexpected small volume posterior fossa SDH without mass-effect.    CT 7/2 head small volume posterior fossa SDH with no mass-effect.   MRI 7/3 sagittal T1 sequence only stable SDH  IV heparin for VTE prophylaxis  warfarin daily prior to admission therapeutic at 2.7  Received FFP and vitamin K on 7 2, INR down to 1.5  Now on heparin IV after agreement with neurosurgery  Warfarin resumed 07/06/2018 and patient will be on heparin bridge until discharge when INR is greater than 2  Seizure-like activity - ? migraine equivalent ?   Ocular migraine after mechanical valve placement in 04/2016, stopped in 09/2017  However developed frequent episode of eyes disconjugate, not able move, talk, but no LOC  Initially responding to Zonegran, now normal effective  Following with Dr. Felecia Shelling at [facility]  May consider add further AEDs such as Keppra, Vimpat or Lamictal  Recommend to avoid Tegretol, Dilantin, Depakote due to drug drug interaction with Coumadin  Further recommendation per Dr. Leonie Man in a.m.  EEG negative  Mechanical heart valve  Placed in 04/2016  On Coumadin at home  Coumadin was on hold due to SDH  On heparin IV per stroke protocol  Close neuro checks  Coumadin resumed (pharmacy dosing) along with IV Heparin for mechanical heart valve. INR - 1.2 (2.7 on admission)  Cardiologist - [facility] - Dr. Jamse Arn at [phone] or [phone].  Hypertension  Highest recorded blood pressure 149/95  Home meds including metoprolol   Stable . 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 Sater and Leonie Man at [facility] 07/04/2018 - "No intervention needed, cont to monitor exam"  07/05/18 - 6 PM another episode - not  talking, fetal position, blurry vision, lasting about 3 to 5 minutes.        Hospital day # 2 Recommend continue Zonegran 300 mg daily and if episodes persist may consider adding Depakote later.  Long discussion with the patient and  Dr. Wyline Copas.  Unfortunately she presents therapeutic dilemma since she clearly needs anticoagulation and she is developed spontaneous small posterior fossa hemorrhage on warfarin and alternative medications like Eliquis have not yet been adequately studied with mechanical valve patients.I recommend bridging with IV heparin and continue warfarin until INR is close to 2.  I do not recommend bridging with Lovenox as it has been shown to increase risk of intracerebral hemorrhage.  I spent  25 minutes in total face-to-face time with the patient, more than 50% of  which was spent in counseling and coordination of care, reviewing test results, images and medication, and discussing the diagnosis of SDH, mechanical valve, seizure-like activity versus complicated migraine, AV block with pacemaker, treatment plan and potential prognosis. This patient's care requiresreview of multiple databases, neurological assessment, discussion with family, other specialists and medical decision making of high complexity.  Stroke team will sign off.  Follow-up as an outpatient in the stroke clinic.  Kindly call for questions.  Discussed with Dr. Theresa Mulligan, MD   To contact Stroke Continuity provider, please refer to [email or web address] After hours, contact General Neurology

## 2018-07-07 NOTE — Progress Notes (Signed)
PROGRESS NOTE    SEE BEHARRY  HYI:[id number] DOB: 1955-11-30 DOA: 07/04/2018 PCP: Biagio Borg, MD    Brief Narrative:  62 y.o. 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  Patient denies any fall or trauma.  Several months ago, patient began to experience recurrent and brief episodes of visual disturbance and has been following with neurology for this.  More recently, she has developed mild frontal headache and episodes of dizziness and uncontrolled eye movements.  This was evaluated with outpatient MRI which revealed a small volume subdural hematoma in the posterior fossa at the ventral cisterna magna and in the upper cervical spine, as well as increased T1 signal in the globus pallidus.  She was directed to the ED for further evaluation of these findings.  Patient denies any recent fevers, chills, cough, shortness of breath, or sick contacts.  ED Course: Upon arrival to the ED, patient is found to be afebrile, saturating well on room air, and with remaining vitals also normal.  Chemistry panel and CBC are unremarkable, and INR is 2.7.  CT head re-demonstrates the small fluid collection.  Neurosurgery evaluated the patient, there is no indication for intervention at this time, she is not felt to need rapid reversal of her INR, but vitamin K and FFP were recommended.  Neurology is also consulted on the patient and recommending EEG, repeat head CT, seizure precautions, and neuro checks.  Assessment & Plan:   Principal Problem:   Subdural hematoma (HCC) Active Problems:   Hypothyroidism   Essential hypertension   Other hypertrophic cardiomyopathy (HCC)   S/P mitral valve replacement  1. Subdural fluid collection    - Presents with headaches and seizure-like activity, evaluated with outpatient MRI on 07/03/18 that revealed a small-volume subdural fluid collection in posterior fossa and upper c-spine  - Neurosurgery has evaluated her in ED, no need for intervention or rapid INR reversal, but recommended vit k and FFP  - Neurology following. Recently discussed case with Dr. Erlinda Hong. Given hx of MVR, will continue on heparin gtt for now and monitor closely for neurologic change. Neurology had discussed with Neurosurgery - MRI obtained this am personally reviewed. No change with stable amount and appearance of subdural blood - Discussed case with on-call Cardiologist who recommends  2. Seizure-like activity  - Appreciate neurology consultation - EEG performed and reviewed - continue Zonegran, dose increased to 300mg  - Per Neurology, if episodes persist, then consideration for adding Depakote  3. Mitral valve replacement  - INR 2.7 on admission, treated with FFP and vitamin K per neurosurgery recs, with repeat INR of 1.5 - She follows with [facility]  - Discussed with Neurology who spoke with Neurosurgery. Plan to continue on heparin gtt bridge for now - Discussed case with on-call Cardiologist who recommends coumadin as only available long-term anticoagulation for patient's specific valve. -Discussed with Neurology. Increased bleeding risk with lovenox, thus will cont heparin bridge -INR today 1.2 -Tolerating heparin gtt  4. Hypertension  - BP at goal, continue losartan and metoprolol   - Remains stable currently  5. Depression  - Patient is continued on Effexor as tolerated  6. Hypothyroidism  - Continue on Synthroid as tolerated  DVT prophylaxis: Heparin gtt Code Status: Full Family Communication: Pt in room, family  over phone Disposition Plan: Uncertain at this time  Consultants:   Neurology  Neurosurgery  Discussed case with Cardiology over phone   Procedures:     Antimicrobials: Anti-infectives (From admission, onward)   None      Subjective: Without complaints this AM. Very eager to go home  Objective: Vitals:   07/07/18 0449 07/07/18 0900 07/07/18 0931 07/07/18 1300  BP: 103/65 115/66 126/68 117/70  Pulse: 69 67  63  Resp: 16 16  17   Temp: 98 F (36.7 C) 98.3 F (36.8 C)  98.5 F (36.9 C)  TempSrc: Oral Oral  Oral  SpO2: 99% 97%  98%  Weight:      Height:        Intake/Output Summary (Last 24 hours) at 07/07/2018 1457 Last data filed at 07/07/2018 3419 Gross per 24 hour  Intake 344.46 ml  Output -  Net 344.46 ml   Filed Weights   07/05/18 0900  Weight: 99 kg    Examination: General exam: Awake, laying in bed, in nad Respiratory system: Normal respiratory effort, no wheezing Cardiovascular system: regular rate, s1, s2 Gastrointestinal system: Soft, nondistended, positive BS Central nervous system: CN2-12 grossly intact, strength intact Extremities: Perfused, no clubbing Skin: Normal skin turgor, no notable skin lesions seen Psychiatry: Mood normal // no visual hallucinations   Data Reviewed: I have personally reviewed following labs and imaging studies  CBC: Recent Labs  Lab 07/04/18 1838 07/05/18 0421 07/06/18 0401 07/07/18 0651  WBC 5.6 5.5 5.5 5.3  NEUTROABS 3.2  --   --   --   HGB 12.1 12.7 13.2 12.7  HCT 35.8* 38.6 38.9 39.5  MCV 82.5 82.7 81.6 83.5  PLT 235 237 228 622   Basic Metabolic Panel: Recent Labs  Lab 07/03/18 1330 07/04/18 1838 07/05/18 0421  NA  --  139 142  K  --  3.6 4.2  CL  --  110 109  CO2  --  21* 24  GLUCOSE  --  99 115*  BUN  --  16 17  CREATININE 0.97 0.87 0.99  CALCIUM  --  8.5* 9.3   GFR: Estimated Creatinine Clearance: 71.5 mL/min (by C-G formula based on SCr of 0.99 mg/dL). Liver Function Tests: No results for input(s): AST, ALT, ALKPHOS, BILITOT, PROT, ALBUMIN in the last 168 hours. No results for input(s): LIPASE, AMYLASE in the last 168 hours.  No results for input(s): AMMONIA in the last 168 hours. Coagulation Profile: Recent Labs  Lab 07/04/18 1838 07/05/18 0421 07/06/18 0401 07/07/18 0651  INR 2.7* 1.5* 1.2 1.2   Cardiac Enzymes: No results for input(s): CKTOTAL, CKMB, CKMBINDEX, TROPONINI in the last 168 hours. BNP (last 3 results) No results for input(s): PROBNP in the last 8760 hours. HbA1C: No results for input(s): HGBA1C in the last 72 hours. CBG: No results for input(s): GLUCAP in the last 168 hours. Lipid Profile: No results for input(s): CHOL, HDL, LDLCALC, TRIG, CHOLHDL, LDLDIRECT in the last 72 hours. Thyroid Function Tests: No results for input(s): TSH, T4TOTAL, FREET4, T3FREE, THYROIDAB in the last 72 hours. Anemia Panel: No results for input(s): VITAMINB12, FOLATE, FERRITIN, TIBC, IRON, RETICCTPCT in the last 72 hours. Sepsis Labs: No results for input(s): PROCALCITON, LATICACIDVEN in the last 168 hours.  Recent Results (from the past 240 hour(s))  Novel Coronavirus,NAA,(SEND-OUT TO REF LAB - TAT 24-48 hrs); Hosp Order     Status: None   Collection Time: 07/04/18  8:23 PM   Specimen: Nasopharyngeal Swab; Respiratory  Result Value  Ref Range Status   SARS-CoV-2, NAA NOT DETECTED NOT DETECTED Final    Comment: (NOTE) This test was developed and its performance characteristics determined by [employer]. This test has not been FDA cleared or approved. This test has been authorized by FDA under an Emergency Use Authorization (EUA). This test is only authorized for the duration of time the declaration that circumstances exist justifying the authorization of the emergency use of in vitro diagnostic tests for detection of SARS-CoV-2 virus and/or diagnosis of COVID-19 infection under section 564(b)(1) of the Act, 21 U.S.C. 740CXK-4(Y)(1), unless the authorization is terminated or revoked sooner. When diagnostic testing is negative, the possibility of a false negative result should be considered in  the context of a patient's recent exposures and the presence of clinical signs and symptoms consistent with COVID-19. An individual without symptoms of COVID-19 and who is not shedding SARS-CoV-2 virus would expect to have a negative (not detected) result in this assay. Performed  At: [facility] [address] [place], [place] [id number] Rush Farmer MD YO:[id number]    [place]  Final    Comment: Performed at [facility], [place] [address] [place], [place] [address]     Radiology Studies: No results found.  Scheduled Meds: . levothyroxine  50 mcg Oral QAC breakfast  . losartan  25 mg Oral Daily  . metoprolol succinate  50 mg Oral Daily  . sodium chloride flush  3 mL Intravenous Q12H  . sodium chloride flush  3 mL Intravenous Q12H  . venlafaxine XR  150 mg Oral Daily  . warfarin  10 mg Oral ONCE-1800  . Warfarin - Pharmacist Dosing Inpatient   Does not apply q1800  . zonisamide  300 mg Oral QHS   Continuous Infusions: . sodium chloride    . heparin 1,150 Units/hr (07/07/18 0743)     LOS: 2 days   Marylu Lund, MD [facility] Pager On Amion  If 7PM-7AM, please contact night-coverage 07/07/2018, 2:57 PM

## 2018-07-07 NOTE — Progress Notes (Signed)
[facility] asked RN if pt still needs FFP, confirmed with Wyline Copas, Md pt does not need it.

## 2018-07-07 NOTE — Progress Notes (Signed)
ANTICOAGULATION CONSULT NOTE - Follow Up Consult  Pharmacy Consult for Coumadin and Heparin Indication: mechanical valve  Allergies  Allergen Reactions  . Fish Allergy Anaphylaxis and Swelling    Scaled fish, like flounder  . Penicillins Anaphylaxis and Hives    Did it involve swelling of the face/tongue/throat, SOB, or low BP? Yes Did it involve sudden or severe rash/hives, skin peeling, or any reaction on the inside of your mouth or nose? Unk Did you need to seek medical attention at a hospital or doctor's office? No When did it last happen? "I was 63 years old" If all above answers are "NO", may proceed with cephalosporin use.   [name] [Levofloxacin In D5w] Nausea And Vomiting  . Latex Dermatitis and Rash    If worn for more than a day, this causes blisters     Patient Measurements: Height: 5\' 8"  (172.7 cm) Weight: 218 lb 4.1 oz (99 kg) IBW/kg (Calculated) : 63.9 Heparin Dosing Weight: 85.6 kg  Vital Signs: Temp: 98 F (36.7 C) (07/05 0449) Temp Source: Oral (07/05 0449) BP: 103/65 (07/05 0449) Pulse Rate: 69 (07/05 0449)  Labs: Recent Labs    07/04/18 1838 07/05/18 0421  07/05/18 1838 07/06/18 0401 07/07/18 0651  HGB 12.1 12.7  --   --  13.2 12.7  HCT 35.8* 38.6  --   --  38.9 39.5  PLT 235 237  --   --  228 228  LABPROT 28.5* 18.0*  --   --  15.0 14.6  INR 2.7* 1.5*  --   --  1.2 1.2  HEPARINUNFRC  --   --    < > 0.20* 0.39 0.47  CREATININE 0.87 0.99  --   --   --   --    < > = values in this interval not displayed.    Estimated Creatinine Clearance: 71.5 mL/min (by C-G formula based on SCr of 0.99 mg/dL).  Assessment:  63 yof presented to the ED on 07/04/18 with dizziness and a mild headache. Initially thought to have an SDH but per discussions with neurology, unclear if this is truly an SDH. She is on chronic warfarin for a history of a mechanical valve. INR was therapeutic on admission at 2.7 - received Vitamin K 10 mg IV x 1 and FFP on 7/2.  Coumadin  15 mg x 1 also given on 7/2 pm, then held.  Heparin drip begun on 7/3 given subtherapeutic INR in setting of heart valve.    Coumadin resumed per Dr. Earlie Counts who discussed with Dr. Leonie Man.  INR today 1.2.   Home Coumadin regimen:  10 mg daily.      Heparin level was at goal (0.47) this am on 1150 units/hr. No reports of bleeding noted.   Goal of Therapy:  Heparin level 0.3-0.5 units/ml INR 2.5-3.5 Monitor platelets by anticoagulation protocol: Yes   Plan:   Will continue Coumadin conservatively at usual dose of 10 mg x 1 tonight.  Continue heparin drip at 1150 units/hr.  Daily heparin level, PT/INR and CBC.  Dameshia Seybold A. Levada Dy, PharmD, [place] Please utilize Amion for appropriate phone number to reach the unit pharmacist ([place])   07/07/2018,7:47 AM

## 2018-07-08 LAB — CBC
HCT: 37.6 % (ref 36.0–46.0)
Hemoglobin: 12.3 g/dL (ref 12.0–15.0)
MCH: 27.1 pg (ref 26.0–34.0)
MCHC: 32.7 g/dL (ref 30.0–36.0)
MCV: 82.8 fL (ref 80.0–100.0)
Platelets: 224 10*3/uL (ref 150–400)
RBC: 4.54 MIL/uL (ref 3.87–5.11)
RDW: 14.2 % (ref 11.5–15.5)
WBC: 5.3 10*3/uL (ref 4.0–10.5)
nRBC: 0 % (ref 0.0–0.2)

## 2018-07-08 LAB — TYPE AND SCREEN
ABO/RH(D): O POS
Antibody Screen: POSITIVE
Donor AG Type: NEGATIVE
Donor AG Type: NEGATIVE
Unit division: 0
Unit division: 0

## 2018-07-08 LAB — BPAM RBC
Blood Product Expiration Date: 202008052359
Blood Product Expiration Date: 202008052359
ISSUE DATE / TIME: 202007021958
ISSUE DATE / TIME: 202007022020
Unit Type and Rh: 5100
Unit Type and Rh: 5100

## 2018-07-08 LAB — HEPARIN LEVEL (UNFRACTIONATED): Heparin Unfractionated: 0.47 IU/mL (ref 0.30–0.70)

## 2018-07-08 LAB — PROTIME-INR
INR: 1.1 (ref 0.8–1.2)
Prothrombin Time: 13.8 seconds (ref 11.4–15.2)

## 2018-07-08 MED ORDER — WARFARIN SODIUM 6 MG PO TABS
12.0000 mg | ORAL_TABLET | Freq: Once | ORAL | Status: AC
  Administered 2018-07-08: 12 mg via ORAL
  Filled 2018-07-08: qty 2

## 2018-07-08 NOTE — Progress Notes (Signed)
ANTICOAGULATION CONSULT NOTE - Follow Up Consult  Pharmacy Consult for Coumadin and Heparin Indication: mechanical valve  Allergies  Allergen Reactions  . Fish Allergy Anaphylaxis and Swelling    Scaled fish, like flounder  . Penicillins Anaphylaxis and Hives    Did it involve swelling of the face/tongue/throat, SOB, or low BP? Yes Did it involve sudden or severe rash/hives, skin peeling, or any reaction on the inside of your mouth or nose? Unk Did you need to seek medical attention at a hospital or doctor's office? No When did it last happen? "I was 63 years old" If all above answers are "NO", may proceed with cephalosporin use.   [name] [Levofloxacin In D5w] Nausea And Vomiting  . Latex Dermatitis and Rash    If worn for more than a day, this causes blisters     Patient Measurements: Height: 5\' 8"  (172.7 cm) Weight: 218 lb 4.1 oz (99 kg) IBW/kg (Calculated) : 63.9 Heparin Dosing Weight: 85.6 kg  Vital Signs: Temp: 98 F (36.7 C) (07/06 0448) Temp Source: Oral (07/06 0448) BP: 125/86 (07/06 0448) Pulse Rate: 79 (07/06 0448)  Labs: Recent Labs    07/06/18 0401 07/07/18 0651 07/08/18 0516  HGB 13.2 12.7 12.3  HCT 38.9 39.5 37.6  PLT 228 228 224  LABPROT 15.0 14.6 13.8  INR 1.2 1.2 1.1  HEPARINUNFRC 0.39 0.47 0.47    Estimated Creatinine Clearance: 71.5 mL/min (by C-G formula based on SCr of 0.99 mg/dL).  Assessment:  63 yof presented to the ED on 07/04/18 with dizziness and a mild headache. Initially thought to have an SDH but per discussions with neurology, unclear if this is truly an SDH. She is on chronic warfarin for a history of a mechanical valve. INR was therapeutic on admission at 2.7 - received Vitamin K 10 mg IV x 1 and FFP on 7/2.  Coumadin 15 mg x 1 also given on 7/2 pm, then held.  Heparin drip begun on 7/3 given subtherapeutic INR in setting of heart valve.  Coumadin resumed per Dr. Earlie Counts who discussed with Dr. Leonie Man. INR today is 1.1 - missed dose  on 7/3 and received vitamin K 7/2. Heparin level is therapeutic at 0.47, on 1150 units/hr. Hgb 12.3, plt 224. No s/sx of bleeding.   Home Coumadin regimen:  10 mg daily.    Goal of Therapy:  Heparin level 0.3-0.5 units/ml INR 2.5-3.5 Monitor platelets by anticoagulation protocol: Yes   Plan:  Will increase warfarin to 12 mg tonight Continue heparin drip at 1150 units/hr. Daily heparin level, PT/INR and CBC.  [name], PharmD, BCCCP Clinical Pharmacist  Pager: [phone] Phone: [phone] Please utilize Amion for appropriate phone number to reach the unit pharmacist ([place]) 07/08/2018,8:21 AM

## 2018-07-08 NOTE — Progress Notes (Signed)
PROGRESS NOTE    [name]  WER:[id number] DOB: [birth year] DOA: 07/04/2018 PCP: Biagio Borg, MD    Brief Narrative:  63 y.o. 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  Patient denies any fall or trauma.  Several months ago, patient began to experience recurrent and brief episodes of visual disturbance and has been following with neurology for this.  More recently, she has developed mild frontal headache and episodes of dizziness and uncontrolled eye movements.  This was evaluated with outpatient MRI which revealed a small volume subdural hematoma in the posterior fossa at the ventral cisterna magna and in the upper cervical spine, as well as increased T1 signal in the globus pallidus.  She was directed to the ED for further evaluation of these findings.  Patient denies any recent fevers, chills, cough, shortness of breath, or sick contacts.  ED Course: Upon arrival to the ED, patient is found to be afebrile, saturating well on room air, and with remaining vitals also normal.  Chemistry panel and CBC are unremarkable, and INR is 2.7.  CT head re-demonstrates the small fluid collection.  Neurosurgery evaluated the patient, there is no indication for intervention at this time, she is not felt to need rapid reversal of her INR, but vitamin K and FFP were recommended.  Neurology is also consulted on the patient and recommending EEG, repeat head CT, seizure precautions, and neuro checks.  Assessment & Plan:   Principal Problem:   Subdural hematoma (HCC) Active Problems:   Hypothyroidism   Essential hypertension   Other hypertrophic cardiomyopathy (HCC)   S/P mitral valve replacement  1. Subdural fluid collection    - Presents with headaches and seizure-like activity, evaluated with outpatient MRI on 07/03/18 that revealed a small-volume subdural fluid collection in posterior fossa and upper c-spine  - Neurosurgery has evaluated her in ED, no need for intervention or rapid INR reversal, but recommended vit k and FFP  - Neurology following. Recently discussed case with Dr. Erlinda Hong. Given hx of MVR, will continue on heparin gtt for now and monitor closely for neurologic change. Neurology had discussed with Neurosurgery - MRI obtained this am personally reviewed. No change with stable amount and appearance of subdural blood - Discussed case with on-call Cardiologist who recommends coumadin. Per Neurology, recommendation for heparin bridge given higher risk of bleeding with lovenox  2. Seizure-like activity  - Appreciate neurology consultation - EEG performed and reviewed - continue Zonegran, dose increased to 300mg  - Per Neurology, if episodes persist, then consideration for adding Depakote  3. Mitral valve replacement  - INR 2.7 on admission, treated with FFP and vitamin K per neurosurgery recs, with repeat INR of 1.5 - She follows with [facility]  - Discussed with Neurology who spoke with Neurosurgery. Plan to continue on heparin gtt bridge for now - Discussed case with on-call Cardiologist who recommends coumadin as only available long-term anticoagulation for patient's specific valve. -Discussed with Neurology. Increased bleeding risk with lovenox, thus will cont heparin bridge -INR today 1.1 -Tolerating heparin gtt bridge. Continue coumadin  4. Hypertension  - BP at goal, continue losartan and metoprolol   - Remains stable currently  5. Depression  - Patient is continued on Effexor as tolerated  6. Hypothyroidism  - Continue on  Synthroid as tolerated  DVT prophylaxis: Heparin gtt Code Status: Full Family Communication: Pt in room, family over phone  Disposition Plan: Uncertain at this time  Consultants:   Neurology  Neurosurgery  Discussed case with Cardiology over phone  Procedures:     Antimicrobials: Anti-infectives (From admission, onward)   None      Subjective: Eager to go home   Objective: Vitals:   07/08/18 0045 07/08/18 0448 07/08/18 0900 07/08/18 1100  BP: (!) 106/58 125/86 127/84 124/81  Pulse: 75 79 68 64  Resp: 18 18 18 18   Temp: 98.4 F (36.9 C) 98 F (36.7 C) 97.7 F (36.5 C) (!) 97.3 F (36.3 C)  TempSrc: Oral Oral Oral Other (Comment)  SpO2: 99% 97% 100% 96%  Weight:      Height:        Intake/Output Summary (Last 24 hours) at 07/08/2018 1532 Last data filed at 07/08/2018 0900 Gross per 24 hour  Intake 780.55 ml  Output -  Net 780.55 ml   Filed Weights   07/05/18 0900  Weight: 99 kg    Examination: General exam: Conversant, in no acute distress Respiratory system: normal chest rise, clear, no audible wheezing  Data Reviewed: I have personally reviewed following labs and imaging studies  CBC: Recent Labs  Lab 07/04/18 1838 07/05/18 0421 07/06/18 0401 07/07/18 0651 07/08/18 0516  WBC 5.6 5.5 5.5 5.3 5.3  NEUTROABS 3.2  --   --   --   --   HGB 12.1 12.7 13.2 12.7 12.3  HCT 35.8* 38.6 38.9 39.5 37.6  MCV 82.5 82.7 81.6 83.5 82.8  PLT 235 237 228 228 287   Basic Metabolic Panel: Recent Labs  Lab 07/03/18 1330 07/04/18 1838 07/05/18 0421  NA  --  139 142  K  --  3.6 4.2  CL  --  110 109  CO2  --  21* 24  GLUCOSE  --  99 115*  BUN  --  16 17  CREATININE 0.97 0.87 0.99  CALCIUM  --  8.5* 9.3   GFR: Estimated Creatinine Clearance: 71.5 mL/min (by C-G formula based on SCr of 0.99 mg/dL). Liver Function Tests: No results for input(s): AST, ALT, ALKPHOS, BILITOT, PROT, ALBUMIN in the last 168 hours. No results for input(s): LIPASE, AMYLASE in the last 168 hours. No results for input(s): AMMONIA in the last 168 hours. Coagulation Profile: Recent Labs  Lab 07/04/18  1838 07/05/18 0421 07/06/18 0401 07/07/18 0651 07/08/18 0516  INR 2.7* 1.5* 1.2 1.2 1.1   Cardiac Enzymes: No results for input(s): CKTOTAL, CKMB, CKMBINDEX, TROPONINI in the last 168 hours. BNP (last 3 results) No results for input(s): PROBNP in the last 8760 hours. HbA1C: No results for input(s): HGBA1C in the last 72 hours. CBG: No results for input(s): GLUCAP in the last 168 hours. Lipid Profile: No results for input(s): CHOL, HDL, LDLCALC, TRIG, CHOLHDL, LDLDIRECT in the last 72 hours. Thyroid Function Tests: No results for input(s): TSH, T4TOTAL, FREET4, T3FREE, THYROIDAB in the last 72 hours. Anemia Panel: No results for input(s): VITAMINB12, FOLATE, FERRITIN, TIBC, IRON, RETICCTPCT in the last 72 hours. Sepsis Labs: No results for input(s): PROCALCITON, LATICACIDVEN in the last 168 hours.  Recent Results (from the past 240 hour(s))  Novel Coronavirus,NAA,(SEND-OUT TO REF LAB - TAT 24-48 hrs); Hosp Order     Status: None   Collection Time: 07/04/18  8:23 PM   Specimen: Nasopharyngeal Swab; Respiratory  Result Value Ref Range Status   SARS-CoV-2, NAA NOT  DETECTED NOT DETECTED Final    Comment: (NOTE) This test was developed and its performance characteristics determined by [employer]. This test has not been FDA cleared or approved. This test has been authorized by FDA under an Emergency Use Authorization (EUA). This test is only authorized for the duration of time the declaration that circumstances exist justifying the authorization of the emergency use of in vitro diagnostic tests for detection of SARS-CoV-2 virus and/or diagnosis of COVID-19 infection under section 564(b)(1) of the Act, 21 U.S.C. 226JFH-5(K)(5), unless the authorization is terminated or revoked sooner. When diagnostic testing is negative, the possibility of a false negative result should be considered in the context of a patient's recent exposures and the presence of clinical signs and  symptoms consistent with COVID-19. An individual without symptoms of COVID-19 and who is not shedding SARS-CoV-2 virus would expect to have a negative (not detected) result in this assay. Performed  At: [facility] [address] [place], [place] [id number] Rush Farmer MD DS:[id number]    [place]  Final    Comment: Performed at [facility], [place] [address] [place], [place] [address]     Radiology Studies: No results found.  Scheduled Meds: . levothyroxine  50 mcg Oral QAC breakfast  . losartan  25 mg Oral Daily  . metoprolol succinate  50 mg Oral Daily  . sodium chloride flush  3 mL Intravenous Q12H  . sodium chloride flush  3 mL Intravenous Q12H  . venlafaxine XR  150 mg Oral Daily  . warfarin  12 mg Oral ONCE-1800  . Warfarin - Pharmacist Dosing Inpatient   Does not apply q1800  . zonisamide  300 mg Oral QHS   Continuous Infusions: . sodium chloride    . heparin 1,150 Units/hr (07/08/18 0900)     LOS: 3 days   Marylu Lund, MD [facility] Pager On Amion  If 7PM-7AM, please contact night-coverage 07/08/2018, 3:32 PM

## 2018-07-09 LAB — CBC
HCT: 36.8 % (ref 36.0–46.0)
Hemoglobin: 12.1 g/dL (ref 12.0–15.0)
MCH: 27.6 pg (ref 26.0–34.0)
MCHC: 32.9 g/dL (ref 30.0–36.0)
MCV: 83.8 fL (ref 80.0–100.0)
Platelets: 227 10*3/uL (ref 150–400)
RBC: 4.39 MIL/uL (ref 3.87–5.11)
RDW: 14.4 % (ref 11.5–15.5)
WBC: 5.7 10*3/uL (ref 4.0–10.5)
nRBC: 0 % (ref 0.0–0.2)

## 2018-07-09 LAB — PROTIME-INR
INR: 1.1 (ref 0.8–1.2)
Prothrombin Time: 14.5 seconds (ref 11.4–15.2)

## 2018-07-09 LAB — HEPARIN LEVEL (UNFRACTIONATED): Heparin Unfractionated: 0.46 IU/mL (ref 0.30–0.70)

## 2018-07-09 MED ORDER — WARFARIN SODIUM 7.5 MG PO TABS
15.0000 mg | ORAL_TABLET | Freq: Once | ORAL | Status: AC
  Administered 2018-07-09: 15 mg via ORAL
  Filled 2018-07-09: qty 2

## 2018-07-09 NOTE — Progress Notes (Signed)
PROGRESS NOTE    [name]  HKV:[id number] DOB: 05-Apr-1955 DOA: 07/04/2018 PCP: Biagio Borg, MD    Brief Narrative:  63 y.o. 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  Patient denies any fall or trauma.  Several months ago, patient began to experience recurrent and brief episodes of visual disturbance and has been following with neurology for this.  More recently, she has developed mild frontal headache and episodes of dizziness and uncontrolled eye movements.  This was evaluated with outpatient MRI which revealed a small volume subdural hematoma in the posterior fossa at the ventral cisterna magna and in the upper cervical spine, as well as increased T1 signal in the globus pallidus.  She was directed to the ED for further evaluation of these findings.  Patient denies any recent fevers, chills, cough, shortness of breath, or sick contacts.  ED Course: Upon arrival to the ED, patient is found to be afebrile, saturating well on room air, and with remaining vitals also normal.  Chemistry panel and CBC are unremarkable, and INR is 2.7.  CT head re-demonstrates the small fluid collection.  Neurosurgery evaluated the patient, there is no indication for intervention at this time, she is not felt to need rapid reversal of her INR, but vitamin K and FFP were recommended.  Neurology is also consulted on the patient and recommending EEG, repeat head CT, seizure precautions, and neuro checks.  Assessment & Plan:   Principal Problem:   Subdural hematoma (HCC) Active Problems:   Hypothyroidism   Essential hypertension   Other hypertrophic cardiomyopathy (HCC)   S/P mitral valve replacement  1. Subdural fluid collection    - Presents with headaches and seizure-like activity, evaluated with outpatient MRI on 07/03/18 that revealed a small-volume subdural fluid collection in posterior fossa and upper c-spine  - Neurosurgery has evaluated her in ED, no need for intervention or rapid INR reversal, but recommended vit k and FFP  - Neurology following. Recently discussed case with Dr. Erlinda Hong. Given hx of MVR, will continue on heparin gtt for now and monitor closely for neurologic change. Neurology had discussed with Neurosurgery - MRI obtained this am personally reviewed. No change with stable amount and appearance of subdural blood - Had earlier discussed case with on-call Cardiologist who recommends coumadin. Per Neurology, recommendation for heparin bridge given higher risk of bleeding with lovenox  2. Seizure-like activity  - Appreciate neurology consultation - EEG performed and reviewed - continue Zonegran, dose increased to 300mg  - Per Neurology, if episodes persist, then consideration for adding Depakote  3. Mitral valve replacement  - INR 2.7 on admission, treated with FFP and vitamin K per neurosurgery recs, with repeat INR of 1.5 - She follows with [facility]  - Discussed with Neurology who spoke with Neurosurgery. Plan to continue on heparin gtt bridge for now - Discussed case with on-call Cardiologist who recommends coumadin as only available long-term anticoagulation for patient's specific valve. -Discussed with Neurology. Increased bleeding risk with lovenox, thus will cont heparin bridge -INR today remains today 1.1. Anticipate INR to trend towards therapeutic range within next 2-3 days -Tolerating heparin gtt bridge. Continue coumadin  4. Hypertension  - BP at goal, continue losartan and metoprolol   - Remains stable currently  5. Depression  -  Patient is continued on Effexor as tolerated  6. Hypothyroidism  - Continue on Synthroid as tolerated  DVT  prophylaxis: Heparin gtt Code Status: Full Family Communication: Pt in room, family over phone Disposition Plan: Uncertain at this time  Consultants:   Neurology  Neurosurgery  Discussed case with Cardiology over phone  Procedures:     Antimicrobials: Anti-infectives (From admission, onward)   None      Subjective: Eager to go home   Objective: Vitals:   07/09/18 0331 07/09/18 0805 07/09/18 0900 07/09/18 1300  BP: 115/69 133/82 (!) 115/102 120/70  Pulse: 73 67 60 64  Resp: 15 18 18 18   Temp: 98.2 F (36.8 C)  98.1 F (36.7 C) 98 F (36.7 C)  TempSrc: Oral  Oral Oral  SpO2: 98% 100% 99% 95%  Weight:      Height:        Intake/Output Summary (Last 24 hours) at 07/09/2018 1449 Last data filed at 07/09/2018 0807 Gross per 24 hour  Intake 505.88 ml  Output -  Net 505.88 ml   Filed Weights   07/05/18 0900  Weight: 99 kg    Examination: General exam: Conversant, in no acute distress Respiratory system: normal chest rise, clear, no audible wheezing  Data Reviewed: I have personally reviewed following labs and imaging studies  CBC: Recent Labs  Lab 07/04/18 1838 07/05/18 0421 07/06/18 0401 07/07/18 0651 07/08/18 0516 07/09/18 0515  WBC 5.6 5.5 5.5 5.3 5.3 5.7  NEUTROABS 3.2  --   --   --   --   --   HGB 12.1 12.7 13.2 12.7 12.3 12.1  HCT 35.8* 38.6 38.9 39.5 37.6 36.8  MCV 82.5 82.7 81.6 83.5 82.8 83.8  PLT 235 237 228 228 224 161   Basic Metabolic Panel: Recent Labs  Lab 07/03/18 1330 07/04/18 1838 07/05/18 0421  NA  --  139 142  K  --  3.6 4.2  CL  --  110 109  CO2  --  21* 24  GLUCOSE  --  99 115*  BUN  --  16 17  CREATININE 0.97 0.87 0.99  CALCIUM  --  8.5* 9.3   GFR: Estimated Creatinine Clearance: 71.5 mL/min (by C-G formula based on SCr of 0.99 mg/dL). Liver Function Tests: No results for input(s): AST, ALT, ALKPHOS, BILITOT, PROT, ALBUMIN in the last 168 hours. No results for input(s): LIPASE, AMYLASE in the last 168 hours. No  results for input(s): AMMONIA in the last 168 hours. Coagulation Profile: Recent Labs  Lab 07/05/18 0421 07/06/18 0401 07/07/18 0651 07/08/18 0516 07/09/18 0515  INR 1.5* 1.2 1.2 1.1 1.1   Cardiac Enzymes: No results for input(s): CKTOTAL, CKMB, CKMBINDEX, TROPONINI in the last 168 hours. BNP (last 3 results) No results for input(s): PROBNP in the last 8760 hours. HbA1C: No results for input(s): HGBA1C in the last 72 hours. CBG: No results for input(s): GLUCAP in the last 168 hours. Lipid Profile: No results for input(s): CHOL, HDL, LDLCALC, TRIG, CHOLHDL, LDLDIRECT in the last 72 hours. Thyroid Function Tests: No results for input(s): TSH, T4TOTAL, FREET4, T3FREE, THYROIDAB in the last 72 hours. Anemia Panel: No results for input(s): VITAMINB12, FOLATE, FERRITIN, TIBC, IRON, RETICCTPCT in the last 72 hours. Sepsis Labs: No results for input(s): PROCALCITON, LATICACIDVEN in the last 168 hours.  Recent Results (from the past 240 hour(s))  Novel Coronavirus,NAA,(SEND-OUT TO REF LAB - TAT 24-48 hrs); Hosp Order     Status: None   Collection Time: 07/04/18  8:23 PM   Specimen: Nasopharyngeal Swab; Respiratory  Result Value Ref Range Status   SARS-CoV-2, NAA NOT DETECTED NOT DETECTED Final    Comment: (NOTE) This test was developed and its performance characteristics determined by [employer]. This test has not been FDA cleared or approved. This test has been authorized by FDA under an Emergency Use Authorization (EUA). This test is only authorized for the duration of time the declaration that circumstances exist justifying the authorization of the emergency use of in vitro diagnostic tests for detection of SARS-CoV-2 virus and/or diagnosis of COVID-19 infection under section 564(b)(1) of the Act, 21 U.S.C. 497WYO-3(Z)(8), unless the authorization is terminated or revoked sooner. When diagnostic testing is negative, the possibility of a false negative result should be  considered in the context of a patient's recent exposures and the presence of clinical signs and symptoms consistent with COVID-19. An individual without symptoms of COVID-19 and who is not shedding SARS-CoV-2 virus would expect to have a negative (not detected) result in this assay. Performed  At: [facility] [address] [place], [place] [id number] Rush Farmer MD JO:[id number]    [place]  Final    Comment: Performed at [facility], [place] [address], [place], [place] [address]     Radiology Studies: No results found.  Scheduled Meds: . levothyroxine  50 mcg Oral QAC breakfast  . losartan  25 mg Oral Daily  . metoprolol succinate  50 mg Oral Daily  . sodium chloride flush  3 mL Intravenous Q12H  . sodium chloride flush  3 mL Intravenous Q12H  . venlafaxine XR  150 mg Oral Daily  . warfarin  15 mg Oral ONCE-1800  . Warfarin - Pharmacist Dosing Inpatient   Does not apply q1800  . zonisamide  300 mg Oral QHS   Continuous Infusions: . sodium chloride    . heparin 1,150 Units/hr (07/09/18 0531)     LOS: 4 days   Marylu Lund, MD [facility] Pager On Amion  If 7PM-7AM, please contact night-coverage 07/09/2018, 2:49 PM

## 2018-07-09 NOTE — Progress Notes (Signed)
ANTICOAGULATION CONSULT NOTE - Follow Up Consult  Pharmacy Consult for Coumadin and Heparin Indication: mechanical valve  Allergies  Allergen Reactions  . Fish Allergy Anaphylaxis and Swelling    Scaled fish, like flounder  . Penicillins Anaphylaxis and Hives    Did it involve swelling of the face/tongue/throat, SOB, or low BP? Yes Did it involve sudden or severe rash/hives, skin peeling, or any reaction on the inside of your mouth or nose? Unk Did you need to seek medical attention at a hospital or doctor's office? No When did it last happen? "I was 63 years old" If all above answers are "NO", may proceed with cephalosporin use.   [name] [Levofloxacin In D5w] Nausea And Vomiting  . Latex Dermatitis and Rash    If worn for more than a day, this causes blisters     Patient Measurements: Height: 5\' 8"  (172.7 cm) Weight: 218 lb 4.1 oz (99 kg) IBW/kg (Calculated) : 63.9 Heparin Dosing Weight: 85.6 kg  Vital Signs: Temp: 98.1 F (36.7 C) (07/07 0900) Temp Source: Oral (07/07 0900) BP: 115/102 (07/07 0900) Pulse Rate: 60 (07/07 0900)  Labs: Recent Labs    07/07/18 0651 07/08/18 0516 07/09/18 0515  HGB 12.7 12.3 12.1  HCT 39.5 37.6 36.8  PLT 228 224 227  LABPROT 14.6 13.8 14.5  INR 1.2 1.1 1.1  HEPARINUNFRC 0.47 0.47 0.46    Estimated Creatinine Clearance: 71.5 mL/min (by C-G formula based on SCr of 0.99 mg/dL).  Assessment:  63 yof presented to the ED on 07/04/18 with dizziness and a mild headache. Initially thought to have an SDH but per discussions with neurology, unclear if this is truly an SDH. She is on chronic warfarin for a history of a mechanical valve. INR was therapeutic on admission at 2.7 - received Vitamin K 10 mg IV x 1 and FFP on 7/2.  Coumadin 15 mg x 1 also given on 7/2 pm, then held.  Heparin drip begun on 7/3 given subtherapeutic INR in setting of heart valve.  Coumadin resumed per Dr. Earlie Counts who discussed with Dr. Leonie Man. INR today is 1.2 - missed  dose on 7/3 and received vitamin K 7/2.  Heparin level therapeutic, CBC stable  Home Coumadin regimen:  10 mg daily.    Goal of Therapy:  Heparin level 0.3-0.5 units/ml INR 2.5-3.5 Monitor platelets by anticoagulation protocol: Yes   Plan:  Warfarin 15 mg po x 1 tonight Continue heparin drip at 1150 units/hr. Daily heparin level, PT/INR and CBC.  Thank you Anette Guarneri, PharmD Please utilize Amion for appropriate phone number to reach the unit pharmacist ([place]) 07/09/2018,12:48 PM

## 2018-07-10 LAB — PROTIME-INR
INR: 1.2 (ref 0.8–1.2)
Prothrombin Time: 14.7 seconds (ref 11.4–15.2)

## 2018-07-10 LAB — CBC
HCT: 37 % (ref 36.0–46.0)
Hemoglobin: 12.1 g/dL (ref 12.0–15.0)
MCH: 27.4 pg (ref 26.0–34.0)
MCHC: 32.7 g/dL (ref 30.0–36.0)
MCV: 83.9 fL (ref 80.0–100.0)
Platelets: 227 10*3/uL (ref 150–400)
RBC: 4.41 MIL/uL (ref 3.87–5.11)
RDW: 14.4 % (ref 11.5–15.5)
WBC: 5.6 10*3/uL (ref 4.0–10.5)
nRBC: 0 % (ref 0.0–0.2)

## 2018-07-10 LAB — HEPARIN LEVEL (UNFRACTIONATED): Heparin Unfractionated: 0.45 IU/mL (ref 0.30–0.70)

## 2018-07-10 MED ORDER — WARFARIN SODIUM 7.5 MG PO TABS
15.0000 mg | ORAL_TABLET | Freq: Once | ORAL | Status: AC
  Administered 2018-07-10: 15 mg via ORAL
  Filled 2018-07-10: qty 2

## 2018-07-10 MED ORDER — PROMETHAZINE HCL 25 MG/ML IJ SOLN
12.5000 mg | Freq: Once | INTRAMUSCULAR | Status: AC
  Administered 2018-07-10: 12.5 mg via INTRAVENOUS
  Filled 2018-07-10: qty 1

## 2018-07-10 NOTE — Progress Notes (Signed)
PROGRESS NOTE  [name] ZCH:[id number] DOB: 09-26-1955 DOA: 07/04/2018 PCP: Biagio Borg, MD  Brief History   63 year old woman PMH mechanical valve replacement on warfarin, presumed ocular migraines, recently had outpatient MRI which revealed an unexpected small collection of subdural blood in posterior fossa.  Patient reported dizziness and abnormal eye movements and was advised to come to the emergency department.  Admitted for subdural hematoma.  By neurology, neurosurgery.  Patient remained stable and eventually consultants recommended starting warfarin again and patient was continued on IV heparin.  She will need to continue IV heparin bridge until INR is therapeutic.  A & P  Small subdural hematoma, etiology unclear.  Neurosurgery evaluated in the emergency department recommended against rapid INR reversal but did recommend vitamin K and FFP 7/2.  Neurology however initially disagreed that this represented blood.  Stroke team concurred with diagnosis of subdural hematoma, thought possibly related to warfarin use. --CT head and follow-up MRI demonstrated stable SDH. --IV heparin and warfarin restarted in concert with neurosurgery --Given ongoing nausea, will repeat CT head in a.m.  Patient without any neurologic symptoms.  Reported seizure-like activity as an outpatient.  Possibly migraine equivalent versus complex partial seizures. --Continue Zonegran increased dose 300 mg daily per neurology.  Could consider adding Depakote later if needed.  Mechanical mitral valve replacement --Follow-up with [facility] as an outpatient --Continue IV heparin bridge until INR therapeutic  Essential hypertension --Continue losartan and metoprolol  Depression --Continue Effexor  Hypothyroidism --Continue Synthroid   Home when INR therapeutic  DVT prophylaxis: Heparin plus warfarin Code Status: full Family Communication: none Disposition Plan: home    Murray Hodgkins, MD  [facility] Direct contact: see [email or web address] (further directions at bottom of note if needed) 7PM-7AM contact night coverage as at bottom of note 07/10/2018, 2:00 PM  LOS: 5 days   Consultants  . Neurology . Neurosurgery  Procedures  . EEG. IMPRESSION: This is a normal awake electroencephalogram. There are no focal lateralizing or epileptiform features.  Antibiotics  .   Interval History/Subjective  Complains of ongoing nausea otherwise feels well.  Nausea has been persistent since admission and is quite terrible.  No neurologic symptoms.  Tolerating diet.  Tries to eat 2 things each meal.  Objective   Vitals:  Vitals:   07/10/18 0751 07/10/18 1110  BP: 127/70 107/67  Pulse: 62 62  Resp: 16 16  Temp: 98.9 F (37.2 C) 98.6 F (37 C)  SpO2: 98% 95%    Exam:  Constitutional:  . Appears calm and comfortable Respiratory:  . CTA bilaterally, no w/r/r.  . Respiratory effort normal.  Cardiovascular:  . RRR, no m/r/g . No LE extremity edema   Psychiatric:  . Mental status o Mood, affect appropriate . judgment and insight appear intact     I have personally reviewed the following:   Today's Data  . CBC unremarkable . INR 1.2  Lab Data  .   Micro Data  . SARS-CoV-2 negative  Imaging  . MRI brain 7/1 and 7/3 noted.  CT head noted 7/2.  Cardiology Data  .   Other Data  .   Scheduled Meds: . levothyroxine  50 mcg Oral QAC breakfast  . losartan  25 mg Oral Daily  . metoprolol succinate  50 mg Oral Daily  . sodium chloride flush  3 mL Intravenous Q12H  . sodium chloride flush  3 mL Intravenous Q12H  . venlafaxine XR  150 mg Oral Daily  . warfarin  15 mg Oral ONCE-1800  . Warfarin - Pharmacist Dosing Inpatient   Does not apply q1800  . zonisamide  300 mg Oral QHS   Continuous Infusions: . sodium chloride    . heparin 1,150 Units/hr (07/10/18 0906)    Principal Problem:   Subdural hematoma (HCC) Active Problems:   Hypothyroidism   Essential  hypertension   Other hypertrophic cardiomyopathy ([place])   S/P mitral valve replacement   LOS: 5 days   How to contact the [facility] Attending or Consulting provider [place] or covering provider during after hours [place], for this patient?  1. Check the care team in [facility] and look for a) attending/consulting TRH provider listed and b) the [facility] team listed 2. Log into [email or web address] and use [place]'s universal password to access. If you do not have the password, please contact the hospital operator. 3. Locate the [facility] provider you are looking for under [facility] and page to a number that you can be directly reached. 4. If you still have difficulty reaching the provider, please page the [facility] (Director on Call) for the Hospitalists listed on amion for assistance.

## 2018-07-10 NOTE — Progress Notes (Addendum)
ANTICOAGULATION CONSULT NOTE - Follow Up Consult  Pharmacy Consult for Coumadin and Heparin Indication: mechanical valve  Allergies  Allergen Reactions  . Fish Allergy Anaphylaxis and Swelling    Scaled fish, like flounder  . Penicillins Anaphylaxis and Hives    Did it involve swelling of the face/tongue/throat, SOB, or low BP? Yes Did it involve sudden or severe rash/hives, skin peeling, or any reaction on the inside of your mouth or nose? Unk Did you need to seek medical attention at a hospital or doctor's office? No When did it last happen? "I was 63 years old" If all above answers are "NO", may proceed with cephalosporin use.   [name] [Levofloxacin In D5w] Nausea And Vomiting  . Latex Dermatitis and Rash    If worn for more than a day, this causes blisters     Patient Measurements: Height: 5\' 8"  (172.7 cm) Weight: 218 lb 4.1 oz (99 kg) IBW/kg (Calculated) : 63.9 Heparin Dosing Weight: 85.6 kg  Vital Signs: Temp: 98.9 F (37.2 C) (07/08 0751) Temp Source: Oral (07/08 0751) BP: 127/70 (07/08 0751) Pulse Rate: 62 (07/08 0751)  Labs: Recent Labs    07/08/18 0516 07/09/18 0515 07/10/18 0318  HGB 12.3 12.1 12.1  HCT 37.6 36.8 37.0  PLT 224 227 227  LABPROT 13.8 14.5 14.7  INR 1.1 1.1 1.2  HEPARINUNFRC 0.47 0.46 0.45    Estimated Creatinine Clearance: 71.5 mL/min (by C-G formula based on SCr of 0.99 mg/dL).  Assessment:  63 yof presented to the ED on 07/04/18 with dizziness and a mild headache. Initially thought to have an SDH but per discussions with neurology, unclear if this is truly an SDH. She is on chronic warfarin for a history of a mechanical valve. INR was therapeutic on admission at 2.7 - received Vitamin K 10 mg IV x 1 and FFP on 7/2.  Coumadin 15 mg x 1 also given on 7/2 pm, then held.  Heparin drip begun on 7/3 given subtherapeutic INR in setting of heart valve.  Coumadin resumed per Dr. Earlie Counts who discussed with Dr. Leonie Man. INR today is 1.2 - missed dose  on 7/3 and received vitamin K 7/2.  Heparin level therapeutic, CBC stable  Home Coumadin regimen:  10 mg daily.    Goal of Therapy:  Heparin level 0.3-0.5 units/ml INR 2.5-3.5 Monitor platelets by anticoagulation protocol: Yes   Plan:  Warfarin 15 mg po x 1 tonight Continue heparin drip at 1150 units/hr. Daily heparin level, PT/INR and CBC.  Thank you Berenice Bouton, PharmD PGY1 Pharmacy Resident Office phone: [phone]  Please utilize Amion for appropriate phone number to reach the unit pharmacist ([place]) 07/10/2018,8:28 AM

## 2018-07-11 ENCOUNTER — Inpatient Hospital Stay (HOSPITAL_COMMUNITY): Payer: BC Managed Care – PPO

## 2018-07-11 LAB — CBC
HCT: 35.7 % — ABNORMAL LOW (ref 36.0–46.0)
Hemoglobin: 11.8 g/dL — ABNORMAL LOW (ref 12.0–15.0)
MCH: 28 pg (ref 26.0–34.0)
MCHC: 33.1 g/dL (ref 30.0–36.0)
MCV: 84.6 fL (ref 80.0–100.0)
Platelets: 217 10*3/uL (ref 150–400)
RBC: 4.22 MIL/uL (ref 3.87–5.11)
RDW: 14.6 % (ref 11.5–15.5)
WBC: 5.5 10*3/uL (ref 4.0–10.5)
nRBC: 0 % (ref 0.0–0.2)

## 2018-07-11 LAB — PROTIME-INR
INR: 1.2 (ref 0.8–1.2)
Prothrombin Time: 15.2 seconds (ref 11.4–15.2)

## 2018-07-11 LAB — HEPARIN LEVEL (UNFRACTIONATED): Heparin Unfractionated: 0.45 IU/mL (ref 0.30–0.70)

## 2018-07-11 MED ORDER — WARFARIN SODIUM 7.5 MG PO TABS
17.5000 mg | ORAL_TABLET | Freq: Once | ORAL | Status: AC
  Administered 2018-07-11: 17.5 mg via ORAL
  Filled 2018-07-11: qty 2

## 2018-07-11 NOTE — Progress Notes (Addendum)
ANTICOAGULATION CONSULT NOTE - Follow Up Consult  Pharmacy Consult for Coumadin and Heparin Indication: mechanical valve  Allergies  Allergen Reactions  . Fish Allergy Anaphylaxis and Swelling    Scaled fish, like flounder  . Penicillins Anaphylaxis and Hives    Did it involve swelling of the face/tongue/throat, SOB, or low BP? Yes Did it involve sudden or severe rash/hives, skin peeling, or any reaction on the inside of your mouth or nose? Unk Did you need to seek medical attention at a hospital or doctor's office? No When did it last happen? "I was 63 years old" If all above answers are "NO", may proceed with cephalosporin use.   [name] [Levofloxacin In D5w] Nausea And Vomiting  . Latex Dermatitis and Rash    If worn for more than a day, this causes blisters     Patient Measurements: Height: 5\' 8"  (172.7 cm) Weight: 218 lb 4.1 oz (99 kg) IBW/kg (Calculated) : 63.9 Heparin Dosing Weight: 85.6 kg  Vital Signs: Temp: 98.6 F (37 C) (07/09 0849) Temp Source: Oral (07/09 0849) BP: 134/69 (07/09 0849) Pulse Rate: 75 (07/09 0849)  Labs: Recent Labs    07/09/18 0515 07/10/18 0318 07/11/18 0400  HGB 12.1 12.1 11.8*  HCT 36.8 37.0 35.7*  PLT 227 227 217  LABPROT 14.5 14.7 15.2  INR 1.1 1.2 1.2  HEPARINUNFRC 0.46 0.45 0.45    Estimated Creatinine Clearance: 71.5 mL/min (by C-G formula based on SCr of 0.99 mg/dL).  Assessment:  [name] presented to the ED on 07/04/18 with dizziness and a mild headache. Initially thought to have an SDH but per discussions with neurology, unclear if this is truly an SDH. She is on chronic warfarin for a history of a mechanical valve. INR was therapeutic on admission at 2.7 - received Vitamin K 10 mg IV x 1 and FFP on 7/2.  Coumadin 15 mg x 1 also given on 7/2 pm, then held.  Heparin drip begun on 7/3 given subtherapeutic INR in setting of heart valve.  Coumadin resumed per Dr. Earlie Counts who discussed with Dr. Leonie Man. INR today is 1.2 - missed dose  on 7/3 and received vitamin K 7/2.  Heparin level therapeutic, CBC stable  Home Coumadin regimen:  10 mg daily.    Goal of Therapy:  Heparin level 0.3-0.5 units/ml INR 2.5-3.5 Monitor platelets by anticoagulation protocol: Yes   Plan:  Increase to Warfarin 17.5 mg po x 1 tonight Continue heparin drip at 1150 units/hr. Daily heparin level, PT/INR and CBC.  Thank you [name], PharmD  PGY1 Pharmacy Resident [facility] [phone]  Please utilize Amion for appropriate phone number to reach the unit pharmacist ([place]) 07/11/2018,9:06 AM

## 2018-07-11 NOTE — Progress Notes (Signed)
PROGRESS NOTE  [name] MGN:[id number] DOB: Sep 06, 1955 DOA: 07/04/2018 PCP: Biagio Borg, MD  Brief History   63 year old woman PMH mechanical valve replacement on warfarin, presumed ocular migraines, recently had outpatient MRI which revealed an unexpected small collection of subdural blood in posterior fossa.  Patient reported dizziness and abnormal eye movements and was advised to come to the emergency department.  Admitted for subdural hematoma.  By neurology, neurosurgery.  Patient remained stable and eventually consultants recommended starting warfarin again and patient was continued on IV heparin.  She will need to continue IV heparin bridge until INR is therapeutic.  A & P  Small subdural hematoma, etiology unclear.  Neurosurgery evaluated in the emergency department recommended against rapid INR reversal but did recommend vitamin K and FFP 7/2.  Neurology however initially disagreed that this represented blood.  Stroke team concurred with diagnosis of subdural hematoma, thought possibly related to warfarin use. CT head and follow-up MRI demonstrated stable SDH. IV heparin and warfarin restarted in concert with neurosurgery --Asymptomatic.  Repeat CT head reassuring and shows near resolution of subdural hematoma with no new evidence of hemorrhage.  Reported seizure-like activity as an outpatient.  Possibly migraine equivalent versus complex partial seizures. --Stable.  Continue Zonegran increased dose 300 mg daily per neurology.  Could consider adding Depakote later if needed.  Mechanical mitral valve replacement --Follow-up with [facility] as an outpatient --Continue IV heparin bridge until INR therapeutic  Essential hypertension --Stable.  Continue losartan and metoprolol  Depression --Appears stable.  Continue Effexor  Hypothyroidism --Continue Synthroid   Appears to be doing well.  Just await therapeutic INR and then discharge.  INR very slow to respond.  Pharmacy  has increased warfarin dose to 17.5 mg.  DVT prophylaxis: Heparin plus warfarin Code Status: full Family Communication: none Disposition Plan: home    Murray Hodgkins, MD  [facility] Direct contact: see [email or web address] (further directions at bottom of note if needed) 7PM-7AM contact night coverage as at bottom of note 07/11/2018, 9:49 AM  LOS: 6 days   Consultants  . Neurology . Neurosurgery  Procedures  . EEG. IMPRESSION: This is a normal awake electroencephalogram. There are no focal lateralizing or epileptiform features.  Antibiotics  .   Interval History/Subjective  No nausea today.  Feeling well.  Tolerating diet.  No neurologic symptoms.  Objective   Vitals:  Vitals:   07/11/18 0311 07/11/18 0849  BP: 121/65 134/69  Pulse: 68 75  Resp: 18 16  Temp: 98.3 F (36.8 C) 98.6 F (37 C)  SpO2: 95% 99%    Exam:  Constitutional:   . Appears calm and comfortable Respiratory:  . CTA bilaterally, no w/r/r.  . Respiratory effort normal.  Cardiovascular:  . RRR, no m/r/g Psychiatric:  . Mental status o Mood, affect appropriate  I have personally reviewed the following:   Today's Data  . Lipid stable 11.8.  INR without change 1.2. . CT head virtually imperceptible subdural hematoma, no evidence of new or increasing hemorrhage.  Lab Data  .   Micro Data  . SARS-CoV-2 negative  Imaging  . MRI brain 7/1 and 7/3 noted.  CT head noted 7/2.  Cardiology Data  .   Other Data  .   Scheduled Meds: . levothyroxine  50 mcg Oral QAC breakfast  . losartan  25 mg Oral Daily  . metoprolol succinate  50 mg Oral Daily  . sodium chloride flush  3 mL Intravenous Q12H  . sodium chloride flush  3 mL Intravenous Q12H  . venlafaxine XR  150 mg Oral Daily  . warfarin  17.5 mg Oral ONCE-1800  . Warfarin - Pharmacist Dosing Inpatient   Does not apply q1800  . zonisamide  300 mg Oral QHS   Continuous Infusions: . sodium chloride    . heparin 1,150 Units/hr  (07/11/18 0718)    Principal Problem:   Subdural hematoma (HCC) Active Problems:   Hypothyroidism   Essential hypertension   Other hypertrophic cardiomyopathy ([place])   S/P mitral valve replacement   LOS: 6 days   How to contact the [facility] Attending or Consulting provider [place] or covering provider during after hours [place], for this patient?  1. Check the care team in [facility] and look for a) attending/consulting TRH provider listed and b) the [facility] team listed 2. Log into [email or web address] and use [place]'s universal password to access. If you do not have the password, please contact the hospital operator. 3. Locate the [facility] provider you are looking for under [facility] and page to a number that you can be directly reached. 4. If you still have difficulty reaching the provider, please page the [facility] (Director on Call) for the Hospitalists listed on amion for assistance.

## 2018-07-12 ENCOUNTER — Ambulatory Visit: Payer: BLUE CROSS/BLUE SHIELD

## 2018-07-12 LAB — CBC
HCT: 34.9 % — ABNORMAL LOW (ref 36.0–46.0)
Hemoglobin: 11.5 g/dL — ABNORMAL LOW (ref 12.0–15.0)
MCH: 27.7 pg (ref 26.0–34.0)
MCHC: 33 g/dL (ref 30.0–36.0)
MCV: 84.1 fL (ref 80.0–100.0)
Platelets: 224 10*3/uL (ref 150–400)
RBC: 4.15 MIL/uL (ref 3.87–5.11)
RDW: 14.6 % (ref 11.5–15.5)
WBC: 5.6 10*3/uL (ref 4.0–10.5)
nRBC: 0 % (ref 0.0–0.2)

## 2018-07-12 LAB — PROTIME-INR
INR: 1.5 — ABNORMAL HIGH (ref 0.8–1.2)
Prothrombin Time: 17.5 seconds — ABNORMAL HIGH (ref 11.4–15.2)

## 2018-07-12 LAB — HEPARIN LEVEL (UNFRACTIONATED): Heparin Unfractionated: 0.48 IU/mL (ref 0.30–0.70)

## 2018-07-12 MED ORDER — WARFARIN SODIUM 7.5 MG PO TABS
17.5000 mg | ORAL_TABLET | Freq: Once | ORAL | Status: AC
  Administered 2018-07-12: 17.5 mg via ORAL
  Filled 2018-07-12: qty 2

## 2018-07-12 NOTE — Progress Notes (Addendum)
ANTICOAGULATION CONSULT NOTE - Follow Up Consult  Pharmacy Consult for Coumadin and Heparin Indication: mechanical valve  Allergies  Allergen Reactions  . Fish Allergy Anaphylaxis and Swelling    Scaled fish, like flounder  . Penicillins Anaphylaxis and Hives    Did it involve swelling of the face/tongue/throat, SOB, or low BP? Yes Did it involve sudden or severe rash/hives, skin peeling, or any reaction on the inside of your mouth or nose? Unk Did you need to seek medical attention at a hospital or doctor's office? No When did it last happen? "I was 63 years old" If all above answers are "NO", may proceed with cephalosporin use.   [name] [Levofloxacin In D5w] Nausea And Vomiting  . Latex Dermatitis and Rash    If worn for more than a day, this causes blisters     Patient Measurements: Height: 5\' 8"  (172.7 cm) Weight: 218 lb 4.1 oz (99 kg) IBW/kg (Calculated) : 63.9 Heparin Dosing Weight: 85.6 kg  Vital Signs: Temp: 98.3 F (36.8 C) (07/10 0409) Temp Source: Oral (07/10 0409) BP: 97/53 (07/10 0409) Pulse Rate: 59 (07/10 0409)  Labs: Recent Labs    07/10/18 0318 07/11/18 0400 07/12/18 0334  HGB 12.1 11.8* 11.5*  HCT 37.0 35.7* 34.9*  PLT 227 217 224  LABPROT 14.7 15.2 17.5*  INR 1.2 1.2 1.5*  HEPARINUNFRC 0.45 0.45 0.48    Estimated Creatinine Clearance: 71.5 mL/min (by C-G formula based on SCr of 0.99 mg/dL).  Assessment:  72 yof presented to the ED on 07/04/18 with dizziness and a mild headache. Initially thought to have an SDH but per discussions with neurology, unclear if this is truly an SDH. She is on chronic warfarin for a history of a mechanical valve. INR was therapeutic on admission at 2.7 - received Vitamin K 10 mg IV x 1 and FFP on 7/2.  Heparin drip begun on 7/3 given subtherapeutic INR in setting of heart valve.Coumadin resumed per Dr. Earlie Counts who discussed with Dr. Leonie Man.  INR today is 1.5, slow to increase last five days 1.2  Heparin level  therapeutic, CBC stable  Home Coumadin regimen:  10 mg daily.    Goal of Therapy:  Heparin level 0.3-0.5 units/ml INR 2.5-3.5 Monitor platelets by anticoagulation protocol: Yes   Plan:  Warfarin 17.5 mg po x 1 tonight Continue heparin drip at 1150 units/hr. Daily heparin level, PT/INR and CBC.  Thank you Acey Lav, PharmD  PGY1 Pharmacy Resident [facility] [phone]  07/12/2018,9:11 AM

## 2018-07-12 NOTE — Progress Notes (Signed)
PROGRESS NOTE  [name] [id number]:[id number] DOB: [birth year] DOA: 07/04/2018 PCP: Biagio Borg, MD  Brief History   63 year old woman PMH mechanical valve replacement on warfarin, presumed ocular migraines, recently had outpatient MRI which revealed an unexpected small collection of subdural blood in posterior fossa.  Patient reported dizziness and abnormal eye movements and was advised to come to the emergency department.  Admitted for subdural hematoma.  By neurology, neurosurgery.  Patient remained stable and eventually consultants recommended starting warfarin again and patient was continued on IV heparin.  She will need to continue IV heparin bridge until INR is therapeutic.  A & P  Small subdural hematoma, etiology unclear.  Neurosurgery evaluated in the emergency department recommended against rapid INR reversal but did recommend vitamin K and FFP 7/2.  Neurology however initially disagreed that this represented blood.  Stroke team concurred with diagnosis of subdural hematoma, thought possibly related to warfarin use. CT head and follow-up MRI demonstrated stable SDH. IV heparin and warfarin restarted in concert with neurosurgery --Asymptomatic.  Last CT was reassuring.  No further evaluation suggested.  Reported seizure-like activity as an outpatient.  Possibly migraine equivalent versus complex partial seizures. --Remains stable.  Continue Zonegran increased dose 300 mg daily per neurology.  Could consider adding Depakote later if needed.  Mechanical mitral valve replacement --Follow-up with [facility] as an outpatient as needed --Continue IV heparin bridge until INR therapeutic  Essential hypertension --Remained stable.  Continue losartan and metoprolol  Depression --Appears stable.  Continue Effexor  Hypothyroidism --Continue Synthroid   Doing well.  Can go home when INR therapeutic.  Appreciate pharmacy efforts.  DVT prophylaxis: Heparin plus warfarin Code Status:  full Family Communication: none Disposition Plan: home    Murray Hodgkins, MD  [facility] Direct contact: see [email or web address] (further directions at bottom of note if needed) 7PM-7AM contact night coverage as at bottom of note 07/12/2018, 3:07 PM  LOS: 7 days   Consultants  . Neurology . Neurosurgery  Procedures  . EEG. IMPRESSION: This is a normal awake electroencephalogram. There are no focal lateralizing or epileptiform features.  Antibiotics  .   Interval History/Subjective  Overall feeling well.  No complaints.  Objective   Vitals:  Vitals:   07/12/18 1018 07/12/18 1225  BP: 127/70 140/79  Pulse: 69 62  Resp:  15  Temp:  98.4 F (36.9 C)  SpO2:  100%    Exam:  Constitutional:   . Appears calm and comfortable Respiratory:  . CTA bilaterally, no w/r/r.  . Respiratory effort normal.  Cardiovascular:  . RRR, no m/r/g Psychiatric:  . Mental status o Mood, affect appropriate  I have personally reviewed the following:   Today's Data  . Hemoglobin stable 11.5. . INR 1.5.  Lab Data  .   Micro Data  . SARS-CoV-2 negative  Imaging  . MRI brain 7/1 and 7/3 noted.  CT head noted 7/2, 710  Cardiology Data  .   Other Data  .   Scheduled Meds: . levothyroxine  50 mcg Oral QAC breakfast  . losartan  25 mg Oral Daily  . metoprolol succinate  50 mg Oral Daily  . sodium chloride flush  3 mL Intravenous Q12H  . sodium chloride flush  3 mL Intravenous Q12H  . venlafaxine XR  150 mg Oral Daily  . warfarin  17.5 mg Oral ONCE-1800  . Warfarin - Pharmacist Dosing Inpatient   Does not apply q1800  . zonisamide  300 mg Oral QHS  Continuous Infusions: . sodium chloride    . heparin 1,150 Units/hr (07/12/18 0824)    Principal Problem:   Subdural hematoma (HCC) Active Problems:   Hypothyroidism   Essential hypertension   Other hypertrophic cardiomyopathy ([place])   S/P mitral valve replacement   LOS: 7 days   How to contact the [facility] Attending or  Consulting provider [place] or covering provider during after hours [place], for this patient?  1. Check the care team in [facility] and look for a) attending/consulting TRH provider listed and b) the [facility] team listed 2. Log into [email or web address] and use [place]'s universal password to access. If you do not have the password, please contact the hospital operator. 3. Locate the [facility] provider you are looking for under [facility] and page to a number that you can be directly reached. 4. If you still have difficulty reaching the provider, please page the [facility] (Director on Call) for the Hospitalists listed on amion for assistance.

## 2018-07-13 LAB — CBC
HCT: 39.7 % (ref 36.0–46.0)
Hemoglobin: 13.1 g/dL (ref 12.0–15.0)
MCH: 27.8 pg (ref 26.0–34.0)
MCHC: 33 g/dL (ref 30.0–36.0)
MCV: 84.1 fL (ref 80.0–100.0)
Platelets: 236 10*3/uL (ref 150–400)
RBC: 4.72 MIL/uL (ref 3.87–5.11)
RDW: 14.6 % (ref 11.5–15.5)
WBC: 5.4 10*3/uL (ref 4.0–10.5)
nRBC: 0 % (ref 0.0–0.2)

## 2018-07-13 LAB — PROTIME-INR
INR: 1.8 — ABNORMAL HIGH (ref 0.8–1.2)
Prothrombin Time: 20.5 seconds — ABNORMAL HIGH (ref 11.4–15.2)

## 2018-07-13 LAB — HEPARIN LEVEL (UNFRACTIONATED)
Heparin Unfractionated: 0.51 IU/mL (ref 0.30–0.70)
Heparin Unfractionated: 0.38 IU/mL (ref 0.30–0.70)

## 2018-07-13 MED ORDER — WARFARIN SODIUM 7.5 MG PO TABS
17.5000 mg | ORAL_TABLET | Freq: Once | ORAL | Status: AC
  Administered 2018-07-13: 17.5 mg via ORAL
  Filled 2018-07-13: qty 2

## 2018-07-13 NOTE — Progress Notes (Signed)
ANTICOAGULATION CONSULT NOTE - Follow Up Consult  Pharmacy Consult for Coumadin and Heparin Indication: mechanical valve  Allergies  Allergen Reactions  . Fish Allergy Anaphylaxis and Swelling    Scaled fish, like flounder  . Penicillins Anaphylaxis and Hives    Did it involve swelling of the face/tongue/throat, SOB, or low BP? Yes Did it involve sudden or severe rash/hives, skin peeling, or any reaction on the inside of your mouth or nose? Unk Did you need to seek medical attention at a hospital or doctor's office? No When did it last happen? "I was 63 years old" If all above answers are "NO", may proceed with cephalosporin use.   [name] [Levofloxacin In D5w] Nausea And Vomiting  . Latex Dermatitis and Rash    If worn for more than a day, this causes blisters     Patient Measurements: Height: 5\' 8"  (172.7 cm) Weight: 218 lb 4.1 oz (99 kg) IBW/kg (Calculated) : 63.9 Heparin Dosing Weight: 85.6 kg  Vital Signs: Temp: 98.6 F (37 C) (07/11 1615) Temp Source: Axillary (07/11 1615) BP: 143/78 (07/11 1615) Pulse Rate: 60 (07/11 1615)  Labs: Recent Labs    07/11/18 0400 07/12/18 0334 07/13/18 0445 07/13/18 1625  HGB 11.8* 11.5* 13.1  --   HCT 35.7* 34.9* 39.7  --   PLT 217 224 236  --   LABPROT 15.2 17.5* 20.5*  --   INR 1.2 1.5* 1.8*  --   HEPARINUNFRC 0.45 0.48 0.51 0.38    Estimated Creatinine Clearance: 71.5 mL/min (by C-G formula based on SCr of 0.99 mg/dL).  Assessment: 65 yof presented to the ED on 07/04/18 with dizziness and a mild headache. Initially thought to have an SDH but per discussions with neurology, unclear if this is truly an SDH. She is on chronic warfarin for a history of a mechanical valve. INR was therapeutic on admission at 2.7 - received Vitamin K 10 mg IV x 1 and FFP on 7/2.  Heparin drip begun on 7/3 given subtherapeutic INR in setting of heart valve. Coumadin resumed per Dr. Earlie Counts who discussed with Dr. Leonie Man.  INR today is 1.8, finally  increasing after multiple days at 1.2  Heparin level therapeutic at 0.38, CBC stable  Home Coumadin regimen:  10 mg daily.    Goal of Therapy:  Heparin level 0.3-0.5 units/ml INR 2.5-3.5 Monitor platelets by anticoagulation protocol: Yes   Plan:  Continue heparin drip 1100 units/hr Daily heparin level, PT/INR and CBC.  Richardine Service, PharmD PGY1 Pharmacy Resident Direct Line: [phone]  Please check AMION for all [place] numbers  07/13/2018 4:58 PM

## 2018-07-13 NOTE — Progress Notes (Signed)
ANTICOAGULATION CONSULT NOTE - Follow Up Consult  Pharmacy Consult for Coumadin and Heparin Indication: mechanical valve  Allergies  Allergen Reactions  . Fish Allergy Anaphylaxis and Swelling    Scaled fish, like flounder  . Penicillins Anaphylaxis and Hives    Did it involve swelling of the face/tongue/throat, SOB, or low BP? Yes Did it involve sudden or severe rash/hives, skin peeling, or any reaction on the inside of your mouth or nose? Unk Did you need to seek medical attention at a hospital or doctor's office? No When did it last happen? "I was 63 years old" If all above answers are "NO", may proceed with cephalosporin use.   [name] [Levofloxacin In D5w] Nausea And Vomiting  . Latex Dermatitis and Rash    If worn for more than a day, this causes blisters     Patient Measurements: Height: 5\' 8"  (172.7 cm) Weight: 218 lb 4.1 oz (99 kg) IBW/kg (Calculated) : 63.9 Heparin Dosing Weight: 85.6 kg  Vital Signs: Temp: 98.6 F (37 C) (07/11 0430) Temp Source: Oral (07/11 0430) BP: 129/79 (07/11 0430) Pulse Rate: 82 (07/11 0430)  Labs: Recent Labs    07/11/18 0400 07/12/18 0334 07/13/18 0445  HGB 11.8* 11.5* 13.1  HCT 35.7* 34.9* 39.7  PLT 217 224 236  LABPROT 15.2 17.5* 20.5*  INR 1.2 1.5* 1.8*  HEPARINUNFRC 0.45 0.48 0.51    Estimated Creatinine Clearance: 71.5 mL/min (by C-G formula based on SCr of 0.99 mg/dL).  Assessment:  63 yof presented to the ED on 07/04/18 with dizziness and a mild headache. Initially thought to have an SDH but per discussions with neurology, unclear if this is truly an SDH. She is on chronic warfarin for a history of a mechanical valve. INR was therapeutic on admission at 2.7 - received Vitamin K 10 mg IV x 1 and FFP on 7/2.  Heparin drip begun on 7/3 given subtherapeutic INR in setting of heart valve.Coumadin resumed per Dr. Earlie Counts who discussed with Dr. Leonie Man.  INR today is 1.8, finally increasing after multiple days at 1.2  Heparin  level slightly high at 0.51, CBC stable  Home Coumadin regimen:  10 mg daily.    Goal of Therapy:  Heparin level 0.3-0.5 units/ml INR 2.5-3.5 Monitor platelets by anticoagulation protocol: Yes   Plan:  Continue Warfarin 17.5 mg po x 1 tonight Reduce heparin drip 1100 units/hr 1600 HL Daily heparin level, PT/INR and CBC.  Levester Fresh, PharmD, BCPS, BCCCP Clinical Pharmacist [phone]  Please check AMION for all [place] numbers  07/13/2018 7:33 AM

## 2018-07-13 NOTE — Progress Notes (Signed)
PROGRESS NOTE  Jessica MELROSE WUJ:[id number] DOB: Apr 20, 1955 DOA: 07/04/2018 PCP: Biagio Borg, MD  Brief History   63 year old woman PMH mechanical valve replacement on warfarin, presumed ocular migraines, recently had outpatient MRI which revealed an unexpected small collection of subdural blood in posterior fossa.  Patient reported dizziness and abnormal eye movements and was advised to come to the emergency department.  Admitted for subdural hematoma.  By neurology, neurosurgery.  Patient remained stable and eventually consultants recommended starting warfarin again and patient was continued on IV heparin.  She will need to continue IV heparin bridge until INR is therapeutic.  A & P  Small subdural hematoma, etiology unclear.  Neurosurgery evaluated in the emergency department recommended against rapid INR reversal but did recommend vitamin K and FFP 7/2.  Neurology however initially disagreed that this represented blood.  Stroke team concurred with diagnosis of subdural hematoma, thought possibly related to warfarin use. CT head and follow-up MRI demonstrated stable SDH. IV heparin and warfarin restarted in concert with neurosurgery --Remains asymptomatic.  Last CT was reassuring.  No further evaluation suggested.  Reported seizure-like activity as an outpatient.  Possibly migraine equivalent versus complex partial seizures. --Remains stable.  Continue Zonegran increased dose 300 mg daily per neurology.  Could consider adding Depakote later if needed.  Mechanical mitral valve replacement --Follow-up with [facility] cardiology as an outpatient as needed --Continue IV heparin bridge until INR therapeutic.  I discussed with the patient's cardiologist at [facility] Dr. Mina Marble, I reviewed the hospitalization with him, he felt the patient can be safely discharged when INR is 2.0 or greater.  Essential hypertension --Remained stable.  Continue losartan and metoprolol  Depression --Stable.  Continue Effexor   Hypothyroidism --Stable.  Continue Synthroid   INR slowly trending up.  Appreciate pharmacy treatment.  Hopefully can discharge tomorrow.  DVT prophylaxis: Heparin plus warfarin Code Status: full Family Communication: none Disposition Plan: home    Murray Hodgkins, MD  [facility] Direct contact: see [email or web address] (further directions at bottom of note if needed) 7PM-7AM contact night coverage as at bottom of note 07/13/2018, 4:30 PM  LOS: 8 days   Consultants  . Neurology . Neurosurgery  Procedures  . EEG. IMPRESSION: This is a normal awake electroencephalogram. There are no focal lateralizing or epileptiform features.  Antibiotics  .   Interval History/Subjective  No new issues.  Objective   Vitals:  Vitals:   07/13/18 1140 07/13/18 1615  BP: 133/72 (!) 143/78  Pulse: 60 60  Resp: 16 20  Temp: 98.2 F (36.8 C) 98.6 F (37 C)  SpO2: 97% 97%    Exam:  Constitutional:   . Appears calm and comfortable Respiratory:  . CTA bilaterally, no w/r/r.  . Respiratory effort normal.  Cardiovascular:  . RRR, no m/r/g Psychiatric:  . Mental status o Mood, affect appropriate  I have personally reviewed the following:   Today's Data  . Hemoglobin 13.1 . INR 1.8  Lab Data  .   Micro Data  . SARS-CoV-2 negative  Imaging  . MRI brain 7/1 and 7/3 noted.  CT head noted 7/2, 7/10  Cardiology Data  .   Other Data  .   Scheduled Meds: . levothyroxine  50 mcg Oral QAC breakfast  . losartan  25 mg Oral Daily  . metoprolol succinate  50 mg Oral Daily  . sodium chloride flush  3 mL Intravenous Q12H  . sodium chloride flush  3 mL Intravenous Q12H  . venlafaxine XR  150 mg  Oral Daily  . warfarin  17.5 mg Oral ONCE-1800  . Warfarin - Pharmacist Dosing Inpatient   Does not apply q1800  . zonisamide  300 mg Oral QHS   Continuous Infusions: . sodium chloride    . heparin 1,100 Units/hr (07/13/18 1549)    Principal Problem:   Subdural hematoma (HCC)  Active Problems:   Hypothyroidism   Essential hypertension   Other hypertrophic cardiomyopathy ([place])   S/P mitral valve replacement   LOS: 8 days   How to contact the [facility] Attending or Consulting provider [place] or covering provider during after hours [place], for this patient?  1. Check the care team in [facility] and look for a) attending/consulting [facility] provider listed and b) the [facility] team listed 2. Log into [email or web address] and use [place]'s universal password to access. If you do not have the password, please contact the hospital operator. 3. Locate the [facility] provider you are looking for under [facility] and page to a number that you can be directly reached. 4. If you still have difficulty reaching the provider, please page the [facility] (Director on Call) for the Hospitalists listed on amion for assistance.

## 2018-07-14 LAB — CBC
HCT: 38.7 % (ref 36.0–46.0)
Hemoglobin: 12.4 g/dL (ref 12.0–15.0)
MCH: 27.1 pg (ref 26.0–34.0)
MCHC: 32 g/dL (ref 30.0–36.0)
MCV: 84.5 fL (ref 80.0–100.0)
Platelets: 230 10*3/uL (ref 150–400)
RBC: 4.58 MIL/uL (ref 3.87–5.11)
RDW: 14.6 % (ref 11.5–15.5)
WBC: 4.7 10*3/uL (ref 4.0–10.5)
nRBC: 0 % (ref 0.0–0.2)

## 2018-07-14 LAB — HEPARIN LEVEL (UNFRACTIONATED): Heparin Unfractionated: 0.46 IU/mL (ref 0.30–0.70)

## 2018-07-14 LAB — PROTIME-INR
INR: 2.2 — ABNORMAL HIGH (ref 0.8–1.2)
Prothrombin Time: 24.5 seconds — ABNORMAL HIGH (ref 11.4–15.2)

## 2018-07-14 NOTE — Discharge Summary (Addendum)
Physician Discharge Summary  JOUD PETTINATO XHB:[id number] DOB: 1955/11/20 DOA: 07/04/2018  PCP: Biagio Borg, MD  Admit date: 07/04/2018 Discharge date: 07/14/2018  Recommendations for Outpatient Follow-up:   Reported seizure-like activity as an outpatient.  Possibly migraine equivalent versus complex partial seizures. --Patient advised on driving restriction until cleared by her neurologist  Mechanical mitral valve replacement --I discussed with the patient's cardiologist at [facility] Dr. Mina Marble 7/10, I reviewed the hospitalization with him, he felt the patient can be safely discharged when INR is 2.0 or greater.  He recommended follow-up INR in a few days after discharge.   Follow-up Information    Biagio Borg, MD Follow up in 2 week(s).   Specialties: Internal Medicine, Radiology Contact information: [address] [place] [address] [phone]            Discharge Diagnoses: Principal diagnosis is #1 1. Small subdural hematoma, etiology unclear 2. Reported seizure-like activity as an outpatient 3. Mechanical mitral valve replacement 4. Essential hypertension 5. Depression 6. Hypothyroidism  Discharge Condition: improved Disposition: home  Diet recommendation: heart healthy  Filed Weights   07/05/18 0900  Weight: 99 kg    History of present illness:  63 year old woman PMH mechanical valve replacement on warfarin, presumed ocular migraines, recently had outpatient MRI which revealed an unexpected small collection of subdural blood in posterior fossa.  Patient reported dizziness and abnormal eye movements and was advised to come to the emergency department.  Admitted for subdural hematoma.    Hospital Course:  INR was reversed with vitamin K and FFP.  Patient was seen by neurology and neurosurgery with recommendations for conservative management.  Follow-up CT and MRI were reassuring.  Most recent CT showed near resolution.  Patient had no neurologic  symptoms of note.  Nausea resolved.  She was continued on a heparin bridge until INR was near therapeutic.  Hospitalization was uncomplicated.  Individual issues as below.  Small subdural hematoma, etiology unclear.  Neurosurgery evaluated in the emergency department recommended against rapid INR reversal but did recommend vitamin K and FFP 7/2.  Neurology however initially disagreed that this represented blood.  Stroke team concurred with diagnosis of subdural hematoma, thought possibly related to warfarin use. CT head and follow-up MRI demonstrated stable SDH. IV heparin and warfarin restarted in concert with neurosurgery --Remains asymptomatic.  Continue warfarin given need for anticoagulation for mechanical valve.  Reported seizure-like activity as an outpatient.  Possibly migraine equivalent versus complex partial seizures. --Remains stable..    Continue Zonegran increased dose 300 mg daily per neurology.  Could consider adding Depakote later if needed.  Mechanical mitral valve replacement --Follow-up with [facility] as an outpatient as needed --I discussed with the patient's cardiologist at [facility] Dr. Mina Marble 7/10, I reviewed the hospitalization with him, he felt the patient can be safely discharged when INR is 2.0 or greater.  He recommended follow-up INR in a few days after discharge.  Essential hypertension --Remained stable.  Continue losartan and metoprolol  Depression --Stable.  Continue Effexor  Hypothyroidism --Stable.  Continue Synthroid  Consultants   Neurology  Neurosurgery  Procedures   EEG. IMPRESSION: This is a normalawakeelectroencephalogram. There are no focal lateralizing or epileptiform features.  Today's assessment: S: No new issues voiced O: Vitals:  Vitals:   07/14/18 0358 07/14/18 0736  BP: 116/78 (!) 143/86  Pulse: 65 67  Resp: 16 20  Temp: 98.2 F (36.8 C) (!) 97.5 F (36.4 C)  SpO2: 97% 98%  Constitutional:   Appears calm and  comfortable Respiratory:   CTA bilaterally, no w/r/r.   Respiratory effort normal.  Cardiovascular:   RRR, no m/r/g Psychiatric:   Mental status o Mood, affect appropriate  INR 2.2 Hemoglobin 12.4 Platelets within normal limits  Discharge Instructions  Discharge Instructions    Diet - low sodium heart healthy   Complete by: As directed    Discharge instructions   Complete by: As directed    Call your physician or seek immediate medical attention for confusion, weakness, numbness, difficulty speaking or swallowing, bleeding or worsening of condition.  Continue warfarin as directed by your physician.  Make an appointment to get your INR checked again either 7/14 or 7/15. Per [facility] statutes, patients with seizures are not allowed to drive until they have been seizure-free for six months.  Use caution when using heavy equipment or power tools. Avoid working on ladders or at heights. Take showers instead of baths. Ensure the water temperature is not too high on the home water heater. Do not go swimming alone. Do not lock yourself in a room alone (i.e. bathroom). When caring for infants or small children, sit down when holding, feeding, or changing them to minimize risk of injury to the child in the event you have a seizure. Maintain good sleep hygiene. Avoid alcohol.  If patienthas another seizure, call 911 and bring them back to the ED if: A. The seizure lasts longer than 5 minutes.  B. The patient doesn't wake shortly after the seizure or has new problems such as difficulty seeing, speaking or moving following the seizure C. The patient was injured during the seizure D. The patient has a temperature over 102 F (39C) E. The patient vomited during the seizure and now is having trouble breathing   Increase activity slowly   Complete by: As directed      Allergies as of 07/14/2018      Reactions   Fish Allergy Anaphylaxis, Swelling   Scaled fish, like flounder     Penicillins Anaphylaxis, Hives   Did it involve swelling of the face/tongue/throat, SOB, or low BP? Yes Did it involve sudden or severe rash/hives, skin peeling, or any reaction on the inside of your mouth or nose? Unk Did you need to seek medical attention at a hospital or doctor's office? No When did it last happen? "I was 63 years old" If all above answers are NO, may proceed with cephalosporin use.   Levaquin [levofloxacin In D5w] Nausea And Vomiting   Latex Dermatitis, Rash   If worn for more than a day, this causes blisters      Medication List    TAKE these medications   acetaminophen-codeine 300-30 MG tablet Commonly known as: TYLENOL #3 1 tab by mouth four times per day as needed What changed:   how much to take  how to take this  when to take this  additional instructions   cetirizine 10 MG tablet Commonly known as: ZYRTEC Take 1 tablet (10 mg total) by mouth daily. What changed:   when to take this  reasons to take this   fluticasone 50 MCG/ACT nasal spray Commonly known as: FLONASE SHAKE LIQUID AND USE 2 SPRAYS IN EACH NOSTRIL DAILY What changed: See the new instructions.   levothyroxine 50 MCG tablet Commonly known as: SYNTHROID TAKE ONE TABLET EVERY DAY What changed: when to take this   metoprolol succinate 50 MG 24 hr tablet Commonly known as: TOPROL-XL Take 1 tablet (  50 mg total) by mouth daily. Take 1 by mouth daily What changed: additional instructions   SAMBUCUS ELDERBERRY PO Take 1 tablet by mouth daily. Dissolvable ELDERBERRY/VITAMIN C/ZINC fizzy tablet   venlafaxine XR 150 MG 24 hr capsule Commonly known as: EFFEXOR-XR TAKE ONE CAPSULE DAILY What changed: when to take this   vitamin C 500 MG tablet Commonly known as: ASCORBIC ACID Take 500 mg by mouth daily.   warfarin 10 MG tablet Commonly known as: Coumadin Take as directed. If you are unsure how to take this medication, talk to your nurse or doctor. Original instructions:  Take 1 tablet daily or as directed by anticoagulation clinic What changed:   how much to take  how to take this  when to take this  additional instructions   zolpidem 10 MG tablet Commonly known as: AMBIEN TAKE 1 TABLET BY MOUTH EVERY DAY AT BEDTIME AS NEEDED What changed: See the new instructions.   zonisamide 100 MG capsule Commonly known as: ZONEGRAN Take 2 capsules (200 mg total) by mouth at bedtime.      Allergies  Allergen Reactions   Fish Allergy Anaphylaxis and Swelling    Scaled fish, like flounder   Penicillins Anaphylaxis and Hives    Did it involve swelling of the face/tongue/throat, SOB, or low BP? Yes Did it involve sudden or severe rash/hives, skin peeling, or any reaction on the inside of your mouth or nose? Unk Did you need to seek medical attention at a hospital or doctor's office? No When did it last happen? "I was 63 years old" If all above answers are NO, may proceed with cephalosporin use.    Levaquin [Levofloxacin In D5w] Nausea And Vomiting   Latex Dermatitis and Rash    If worn for more than a day, this causes blisters     The results of significant diagnostics from this hospitalization (including imaging, microbiology, ancillary and laboratory) are listed below for reference.    Significant Diagnostic Studies: Ct Head Wo Contrast  Result Date: 07/11/2018 CLINICAL DATA:  Follow-up subdural hemorrhage EXAM: CT HEAD WITHOUT CONTRAST TECHNIQUE: Contiguous axial images were obtained from the base of the skull through the vertex without intravenous contrast. COMPARISON:  CT brain, 07/04/2018, MR brain, 07/03/2018, 07/05/2018 FINDINGS: Brain: No evidence of acute infarction, hydrocephalus, extra-axial collection or mass lesion/mass effect. Previously noted trace subdural hematoma in the right posterior fossa is hypodense and virtually imperceptible on current examination (series 6, image 25). Vascular: No hyperdense vessel or unexpected  calcification. Skull: Normal. Negative for fracture or focal lesion. Sinuses/Orbits: No acute finding. Other: None. IMPRESSION: Previously noted trace subdural hematoma in the right posterior fossa is hypodense and virtually imperceptible on current examination (series 6, image 25). No evidence of new or increasing hemorrhage. Electronically Signed   By: Eddie Candle M.D.   On: 07/11/2018 09:25   Ct Head Wo Contrast  Result Date: 07/04/2018 CLINICAL DATA:  63 year old female found to have small volume posterior fossa subdural hematoma on routine outpatient MRI yesterday done for seizure like activity. EXAM: CT HEAD WITHOUT CONTRAST TECHNIQUE: Contiguous axial images were obtained from the base of the skull through the vertex without intravenous contrast. COMPARISON:  MRI 07/03/2018.  Head CT 09/13/2016. FINDINGS: Brain: Subtle CT evidence of the small volume posterior fossa hematoma seen by MRI yesterday (sagittal image 37). And as expected this is new from the 2018 CT. The blood is intermediate density. No associated posterior fossa mass effect. No other intracranial hemorrhage identified. Gray-white matter differentiation  is within normal limits throughout the brain. No ventriculomegaly. No cortically based acute infarct identified. Vascular: No suspicious intracranial vascular hyperdensity. Skull: Stable and intact.  No acute osseous abnormality identified. Sinuses/Orbits: Visualized paranasal sinuses and mastoids are stable and well pneumatized. Other: Visualized orbits and scalp soft tissues are within normal limits. IMPRESSION: 1. Re-demonstrated small volume posterior fossa subdural hematoma (trace by CT) as seen by MRI yesterday. This is intermediate density, and there is no associated mass effect. 2. No skull fracture identified and otherwise negative noncontrast CT appearance of the brain. Electronically Signed   By: Genevie Ann M.D.   On: 07/04/2018 18:07   Mr Brain Wo Contrast  Result Date:  07/05/2018 CLINICAL DATA:  Follow-up posterior fossa and upper cervical subdural hematoma. EXAM: MRI HEAD WITHOUT CONTRAST TECHNIQUE: Multiplanar, multiecho pulse sequences of the brain and surrounding structures were obtained without intravenous contrast. COMPARISON:  07/03/2018 MRI.  07/04/2018 CT. FINDINGS: Brain: T1 imaging is performed for evaluation of the subdural. The examination is precisely unchanged. Subdural blood along the floor of the posterior fossa remains visible, maximal thickness 4 mm, without mass effect. Subdural blood along the anterior aspect of the foramen magnum and upper cervical spine is unchanged, extending down to the level of the L2-3 disc. Maximal thickness at the C2 level is 5 mm. Vascular: No abnormal vascular finding. Skull and upper cervical spine: Negative Sinuses/Orbits: Clear/normal Other: None IMPRESSION: No change. Stable amount and appearance of subdural blood along the floor of the posterior fossa and the ventral foramen magnum and upper spinal canal. Electronically Signed   By: Nelson Chimes M.D.   On: 07/05/2018 11:31   Mr Jeri Cos IE Contrast  Addendum Date: 07/03/2018   ADDENDUM REPORT: 07/03/2018 21:06 ADDENDUM: Study discussed by telephone with Dr. Antony Contras (covering for Provider [facility] [place] ) on 07/03/2018 at 2053 hours. Electronically Signed   By: Genevie Ann M.D.   On: 07/03/2018 21:06   Result Date: 07/03/2018 CLINICAL DATA:  63 year old female with seizure-like activity. The patient has an MRI compatible pacemaker, with recommended conditions followed and no adverse events. EXAM: MRI HEAD WITHOUT AND WITH CONTRAST TECHNIQUE: Multiplanar, multiecho pulse sequences of the brain and surrounding structures were obtained without and with intravenous contrast. CONTRAST:  10 milliliters Gadavist COMPARISON:  Head CT 09/13/2016 and earlier. FINDINGS: Brain: There is a small volume of subdural hemorrhage in the posterior fossa along the undersurface of the cerebellum (series  9, image 16 on the left and image 7 on the right) which appears new since 2018. Superimposed similar small volume of subdural blood along the ventral cisterna magna and in the upper cervical spine on series 9, image 11. The blood products at each of the sites measures 3-5 millimeters in thickness there is no significant mass effect. Otherwise there are occasional chronic micro hemorrhages in the brain (series 14, image 29), but no other acute intracranial hemorrhage. No superimposed restricted diffusion to suggest acute infarction. No midline shift, mass effect, ventriculomegaly. No intraventricular blood or debris. There is abnormal intrinsic T1 hyperintensity in the bilateral globus pallidus, most apparent on sagittal T1 (series 9, image 9). Otherwise gray and white matter signal is largely normal for age throughout the brain, no cortical encephalomalacia identified. The deep gray matter nuclei and brainstem appear normal. Thin slice imaging of the hippocampal formations appears symmetric and within normal limits. No abnormal enhancement identified. No dural thickening identified. Pituitary within normal limits. Vascular: Major intracranial vascular flow voids are preserved. There is  mild generalized intracranial artery tortuosity. The left vertebral artery appears mildly dominant. The major dural venous sinuses are enhancing and appear to be patent. Skull and upper cervical spine: Aside from the subdural hemorrhage dorsal to the C2 vertebra (series 9, image 11), the visible upper cervical spine appears normal. Visualized bone marrow signal is within normal limits. Sinuses/Orbits: Negative orbits.  Paranasal sinuses are clear. Other: Mastoids are clear. Visible internal auditory structures appear normal. Scalp and face soft tissues appear negative. IMPRESSION: 1. Unexpected finding of small volume subdural hematoma in the posterior fossa, at the ventral cisterna magna, and in the upper cervical spine (series 9,  image 11). Query recent trauma and/or coagulopathy. There is no significant mass effect. 2. Intrinsic increased T1 signal in the globus pallidus which can be seen in the setting of hepatic insufficiency, cirrhosis, portosystemic shunt, portal vein thrombosis, total parenteral nutrition, and is less often seen with hyperglycemia or other metabolic disturbances such as Wilson's disease. Correlation with serum ammonia levels and liver function tests is recommended. 3. Otherwise largely normal for age brain parenchyma; there are occasional chronic micro hemorrhages. Electronically Signed: By: Genevie Ann M.D. On: 07/03/2018 20:12    Microbiology: Recent Results (from the past 240 hour(s))  Novel Coronavirus,NAA,(SEND-OUT TO REF LAB - TAT 24-48 hrs); Hosp Order     Status: None   Collection Time: 07/04/18  8:23 PM   Specimen: Nasopharyngeal Swab; Respiratory  Result Value Ref Range Status   SARS-CoV-2, NAA NOT DETECTED NOT DETECTED Final    Comment: (NOTE) This test was developed and its performance characteristics determined by [employer]. This test has not been FDA cleared or approved. This test has been authorized by FDA under an Emergency Use Authorization (EUA). This test is only authorized for the duration of time the declaration that circumstances exist justifying the authorization of the emergency use of in vitro diagnostic tests for detection of SARS-CoV-2 virus and/or diagnosis of COVID-19 infection under section 564(b)(1) of the Act, 21 U.S.C. 409WJX-9(J)(4), unless the authorization is terminated or revoked sooner. When diagnostic testing is negative, the possibility of a false negative result should be considered in the context of a patient's recent exposures and the presence of clinical signs and symptoms consistent with COVID-19. An individual without symptoms of COVID-19 and who is not shedding SARS-CoV-2 virus would expect to have a negative (not detected) result in this  assay. Performed  At: [facility] [address] [place], [place] [id number] Rush Farmer MD YQ:[id number]    [place]  Final    Comment: Performed at [facility], [place] [address], [place],  [address]     Labs: CBC: Recent Labs  Lab 07/10/18 [phone] 07/11/18 0400 07/12/18 0334 07/13/18 0445 07/14/18 0435  WBC 5.6 5.5 5.6 5.4 4.7  HGB 12.1 11.8* 11.5* 13.1 12.4  HCT 37.0 35.7* 34.9* 39.7 38.7  MCV 83.9 84.6 84.1 84.1 84.5  PLT 227 217 224 236 230    Principal Problem:   Subdural hematoma (HCC) Active Problems:   Hypothyroidism   Essential hypertension   Other hypertrophic cardiomyopathy (HCC)   S/P mitral valve replacement   Time coordinating discharge: 20 minutes  Signed:  Murray Hodgkins, MD  [facility]  07/14/2018, 8:02 AM

## 2018-07-16 ENCOUNTER — Ambulatory Visit (INDEPENDENT_AMBULATORY_CARE_PROVIDER_SITE_OTHER): Payer: BC Managed Care – PPO | Admitting: General Practice

## 2018-07-16 ENCOUNTER — Other Ambulatory Visit: Payer: Self-pay

## 2018-07-16 DIAGNOSIS — Z5181 Encounter for therapeutic drug level monitoring: Secondary | ICD-10-CM

## 2018-07-16 LAB — POCT INR: INR: 2.9 (ref 2.0–3.0)

## 2018-07-16 NOTE — Patient Instructions (Addendum)
Pre visit review using our clinic review tool, if applicable. No additional management support is needed unless otherwise documented below in the visit note.  Take 1/2 tablet (5 mg) today and then take 10 mg daily except take (5 mg) every Tuesday.  Re-check in 1 week.

## 2018-07-21 ENCOUNTER — Encounter: Payer: Self-pay | Admitting: Family Medicine

## 2018-07-23 ENCOUNTER — Ambulatory Visit (INDEPENDENT_AMBULATORY_CARE_PROVIDER_SITE_OTHER): Payer: BC Managed Care – PPO | Admitting: General Practice

## 2018-07-23 ENCOUNTER — Other Ambulatory Visit: Payer: Self-pay

## 2018-07-23 ENCOUNTER — Telehealth: Payer: Self-pay | Admitting: Emergency Medicine

## 2018-07-23 DIAGNOSIS — K580 Irritable bowel syndrome with diarrhea: Secondary | ICD-10-CM

## 2018-07-23 DIAGNOSIS — Z5181 Encounter for therapeutic drug level monitoring: Secondary | ICD-10-CM | POA: Diagnosis not present

## 2018-07-23 LAB — POCT INR: INR: 4.1 — AB (ref 2.0–3.0)

## 2018-07-23 MED ORDER — ACETAMINOPHEN-CODEINE #3 300-30 MG PO TABS: ORAL_TABLET | ORAL | 0 refills | Status: DC

## 2018-07-23 NOTE — Telephone Encounter (Signed)
Don erx 

## 2018-07-23 NOTE — Telephone Encounter (Signed)
Pt is requesting a refill on her acetaminophen-codeine (TYLENOL #3) 300-30 MG tablet. Pharmacy is [place], [place]. Thanks.

## 2018-07-23 NOTE — Patient Instructions (Addendum)
Pre visit review using our clinic review tool, if applicable. No additional management support is needed unless otherwise documented below in the visit note.  Skip dosage today (7/21) and tomorrow (7/22) then take 10 mg daily except 5 mg on Saturday.  Re-check in 1 week.

## 2018-07-23 NOTE — Telephone Encounter (Signed)
-----   Message from Biagio Borg, MD sent at 07/23/2018 12:35 PM EDT ----- Regarding: RE: INR I would stay with the current goal due to her unusual heart history. thanks ----- Message ----- From: Warden Fillers, RN Sent: 07/23/2018  10:21 AM EDT To: Biagio Borg, MD Subject: INR                                            Dr. [name],  Patient was hospitalized recently with a subdural hematoma. Pt said the Dr's didn't find the source of the bleeding.  Her goal range for INR is 2.5 - 3.5 due to a mechanical MVR.  What are your thoughts about reducing her goal range?  Her INR was actually 2.7 when she went in the hospital.  Your thoughts?  Villa Herb RN

## 2018-07-23 NOTE — Addendum Note (Signed)
Addended by: Biagio Borg on: 07/23/2018 12:43 PM   Modules accepted: Orders

## 2018-07-30 ENCOUNTER — Ambulatory Visit (INDEPENDENT_AMBULATORY_CARE_PROVIDER_SITE_OTHER): Payer: BC Managed Care – PPO | Admitting: General Practice

## 2018-07-30 ENCOUNTER — Other Ambulatory Visit: Payer: Self-pay

## 2018-07-30 DIAGNOSIS — Z5181 Encounter for therapeutic drug level monitoring: Secondary | ICD-10-CM | POA: Diagnosis not present

## 2018-07-30 LAB — POCT INR: INR: 1.8 — AB (ref 2.0–3.0)

## 2018-07-30 NOTE — Patient Instructions (Addendum)
Pre visit review using our clinic review tool, if applicable. No additional management support is needed unless otherwise documented below in the visit note.  Take 1 1/2 tablets today (7/28) and then take 1 (10 mg) daily.  Return in 1 week.

## 2018-08-05 ENCOUNTER — Telehealth: Payer: Self-pay | Admitting: Neurology

## 2018-08-05 NOTE — Telephone Encounter (Signed)
I called and LMVM for pt that I returned call.  Looks from last [employer], [place]/NP 07/2017 recommended to see Dr. Felecia Shelling for f/u.  Please call back

## 2018-08-05 NOTE — Telephone Encounter (Signed)
Pt states she is at 300mg  on her zonisamide (ZONEGRAN) 100 MG capsule due to brain bleeds that she was hospitalized for. Pt would like RN or Provider to call her to discuss this.

## 2018-08-06 DIAGNOSIS — R4689 Other symptoms and signs involving appearance and behavior: Secondary | ICD-10-CM | POA: Insufficient documentation

## 2018-08-07 NOTE — Telephone Encounter (Signed)
Called, LVM for pt to call office. 

## 2018-08-09 ENCOUNTER — Other Ambulatory Visit: Payer: Self-pay

## 2018-08-09 ENCOUNTER — Ambulatory Visit: Payer: Medicare Other

## 2018-08-09 ENCOUNTER — Ambulatory Visit (INDEPENDENT_AMBULATORY_CARE_PROVIDER_SITE_OTHER): Payer: BC Managed Care – PPO | Admitting: General Practice

## 2018-08-09 DIAGNOSIS — Z5181 Encounter for therapeutic drug level monitoring: Secondary | ICD-10-CM | POA: Diagnosis not present

## 2018-08-09 LAB — POCT INR: INR: 3.6 — AB (ref 2.0–3.0)

## 2018-08-09 NOTE — Patient Instructions (Addendum)
Pre visit review using our clinic review tool, if applicable. No additional management support is needed unless otherwise documented below in the visit note.  Hold coumadin today and then continue to take  1 (10 mg) daily.  Please call about the meter as soon as possible.

## 2018-08-15 ENCOUNTER — Other Ambulatory Visit: Payer: Self-pay | Admitting: Internal Medicine

## 2018-08-15 DIAGNOSIS — I480 Paroxysmal atrial fibrillation: Secondary | ICD-10-CM | POA: Diagnosis not present

## 2018-08-15 DIAGNOSIS — Z954 Presence of other heart-valve replacement: Secondary | ICD-10-CM | POA: Diagnosis not present

## 2018-08-26 ENCOUNTER — Telehealth: Payer: Self-pay | Admitting: Internal Medicine

## 2018-08-26 DIAGNOSIS — K580 Irritable bowel syndrome with diarrhea: Secondary | ICD-10-CM

## 2018-08-26 NOTE — Telephone Encounter (Signed)
See request for refill on Tylenol # 3.

## 2018-08-26 NOTE — Telephone Encounter (Signed)
Medication: acetaminophen-codeine (TYLENOL #3) 300-30 MG tablet [id number] Patient states she is out of medication  Has the patient contacted their pharmacy? Yes  (Agent: If no, request that the patient contact the pharmacy for the refill.) (Agent: If yes, when and what did the pharmacy advise?)  Preferred Pharmacy (with phone number or street name): [facility] [place], [place] ([place] [phone] (Phone) [phone] (Fax)    Agent: Please be advised that RX refills may take up to 3 business days. We ask that you follow-up with your pharmacy.

## 2018-08-27 MED ORDER — ACETAMINOPHEN-CODEINE #3 300-30 MG PO TABS: ORAL_TABLET | ORAL | 0 refills | Status: DC

## 2018-08-27 NOTE — Telephone Encounter (Signed)
Done erx 

## 2018-08-29 ENCOUNTER — Other Ambulatory Visit: Payer: Self-pay

## 2018-08-29 ENCOUNTER — Ambulatory Visit (INDEPENDENT_AMBULATORY_CARE_PROVIDER_SITE_OTHER): Payer: BC Managed Care – PPO | Admitting: Internal Medicine

## 2018-08-29 ENCOUNTER — Encounter: Payer: Self-pay | Admitting: Internal Medicine

## 2018-08-29 VITALS — BP 126/78 | HR 69 | Temp 98.4°F | Ht 68.0 in

## 2018-08-29 DIAGNOSIS — Z0000 Encounter for general adult medical examination without abnormal findings: Secondary | ICD-10-CM | POA: Diagnosis not present

## 2018-08-29 DIAGNOSIS — Z1211 Encounter for screening for malignant neoplasm of colon: Secondary | ICD-10-CM | POA: Diagnosis not present

## 2018-08-29 DIAGNOSIS — H9203 Otalgia, bilateral: Secondary | ICD-10-CM | POA: Insufficient documentation

## 2018-08-29 DIAGNOSIS — Z0001 Encounter for general adult medical examination with abnormal findings: Secondary | ICD-10-CM

## 2018-08-29 DIAGNOSIS — F411 Generalized anxiety disorder: Secondary | ICD-10-CM | POA: Diagnosis not present

## 2018-08-29 DIAGNOSIS — I10 Essential (primary) hypertension: Secondary | ICD-10-CM | POA: Diagnosis not present

## 2018-08-29 DIAGNOSIS — J069 Acute upper respiratory infection, unspecified: Secondary | ICD-10-CM | POA: Insufficient documentation

## 2018-08-29 DIAGNOSIS — H6593 Unspecified nonsuppurative otitis media, bilateral: Secondary | ICD-10-CM

## 2018-08-29 MED ORDER — CEFUROXIME AXETIL 250 MG PO TABS: 250.0000 mg | ORAL_TABLET | Freq: Two times a day (BID) | ORAL | 1 refills | Status: AC

## 2018-08-29 NOTE — Assessment & Plan Note (Signed)
stable overall by history and exam, recent data reviewed with pt, and pt to continue medical treatment as before,  to f/u any worsening symptoms or concerns  

## 2018-08-29 NOTE — Assessment & Plan Note (Addendum)
Mild to mod, for antibx course,  to f/u any worsening symptoms or concerns  In addition to the time spent performing CPE, I spent an additional 25 minutes face to face,in which greater than 50% of this time was spent in counseling and coordination of care for patient's acute illness as documented, including the differential dx, treatment, further evaluation and other management of bialteral ear pain, HTN, anxiety, colon cancer screening

## 2018-08-29 NOTE — Progress Notes (Signed)
Subjective:    Patient ID: [name], female    DOB: [birth year], 63 y.o.   MRN: [id number]  HPI  Here for wellness and f/u;  Overall doing ok;  Pt denies Chest pain, worsening SOB, DOE, wheezing, orthopnea, PND, worsening LE edema, palpitations, dizziness or syncope.  Pt denies neurological change such as new headache, facial or extremity weakness.  Pt denies polydipsia, polyuria, or low sugar symptoms. Pt states overall good compliance with treatment and medications, good tolerability, and has been trying to follow appropriate diet.  Pt denies worsening depressive symptoms, suicidal ideation or panic. No fever, night sweats, wt loss, loss of appetite, or other constitutional symptoms.  Pt states good ability with ADL's, has low fall risk, home safety reviewed and adequate, no other significant changes in hearing or vision, and only occasionally active with exercise. Also, Last INR 4.1, now with persitent right occiput HA, worried her SDH is worsening, has f/u with [facility] late sept to r/o worsening siezure off the [employer].   Also with persistent Fatigue, low energy, could not really enjoy the grandson yesterday, due to worsening bialteral ear pain, fever and HA.  Past Medical History:  Diagnosis Date  . Abdominal pain, epigastric 10/25/2007  . ADD 01/20/2009  . ALLERGIC RHINITIS 10/15/2006  . ANGIOEDEMA 08/12/2008  . ANXIETY 10/15/2006  . ASTHMATIC BRONCHITIS, ACUTE 01/27/2008  . Cellulitis and abscess of leg, except foot 08/12/2007  . CELLULITIS, [place] 12/21/2008  . CHEST PAIN 06/14/2007  . Chronic anticoagulation 05/16/2016  . Chronic sinus infection 04/12/2010  . COLECTOMY, PARTIAL, WITH ANASTOMOSIS, HX OF 10/15/2006  . COLONIC POLYPS, HX OF 10/15/2006  . CYST, OVARIAN NEC/NOS 10/15/2006  . Dengue 02/22/2010  . DVT, HX OF 10/15/2006  . DYSAUTONOMIA 10/15/2006  . Dysfunction of eustachian tube 05/26/2009  . GI BLEEDING 04/04/2007  . Glossitis 03/06/2008  . HEADACHE, CHRONIC 04/04/2007  .  Heart murmur   . Hematemesis 10/25/2007  . HEMORRHOIDS 04/04/2007  . HYPERLIPIDEMIA 12/12/2007  . HYPERTENSION 10/15/2006  . HYPOTHYROIDISM 03/06/2008  . IBS 04/04/2007  . MASTECTOMY, BILATERAL, HX OF 08/12/2009  . NECK MASS 07/01/2007  . NEOP, MALIGNANT, FEMALE BREAST NOS 10/15/2006  . 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 SYNCOPE 07/01/2007  . Wheezing 08/20/2008  . WOUND, OPEN, LEG, WITHOUT COMPLICATION [id number]   Past Surgical History:  Procedure Laterality Date  . ABDOMINAL HYSTERECTOMY    . bilateral mastectomy/reconstruction    . CESAREAN SECTION    . CHOLECYSTECTOMY    . COLONOSCOPY W/ BIOPSIES    . FLEXIBLE SIGMOIDOSCOPY    . LEFT AND RIGHT HEART CATHETERIZATION WITH CORONARY ANGIOGRAM N/A 10/07/2012   Procedure: LEFT AND RIGHT HEART CATHETERIZATION WITH CORONARY ANGIOGRAM;  Surgeon: Jolaine Artist, MD;  Location: [facility] CATH LAB;  Service: Cardiovascular;  Laterality: N/A;  . multiple ablations    . OOPHORECTOMY    . open heart surg    . spigellian hernia      reports that she has never smoked. She has never used smokeless tobacco. She reports that she does not drink alcohol or use drugs. family history includes Cancer in her sister; Heart disease in her mother; Pancreatic cancer in her father. Allergies  Allergen Reactions  . Fish Allergy Anaphylaxis and Swelling    Scaled fish, like flounder  . Penicillins Anaphylaxis and Hives    Did it involve swelling of the face/tongue/throat, SOB, or low BP? Yes Did it involve sudden or severe rash/hives, skin peeling, or any reaction on the  inside of your mouth or nose? Unk Did you need to seek medical attention at a hospital or doctor's office? No When did it last happen? "I was 63 years old" If all above answers are "NO", may proceed with cephalosporin use.   [name] [Levofloxacin In D5w] Nausea And Vomiting  . Latex Dermatitis and Rash    If worn for more than a day, this causes blisters    Current Outpatient Medications  on File Prior to Visit  Medication Sig Dispense Refill  . acetaminophen-codeine (TYLENOL #3) 300-30 MG tablet 1 tab by mouth four times per day as needed 120 tablet 0  . Black Elderberry (SAMBUCUS ELDERBERRY PO) Take 1 tablet by mouth daily. Dissolvable ELDERBERRY/VITAMIN C/ZINC fizzy tablet    . cetirizine (ZYRTEC) 10 MG tablet Take 1 tablet (10 mg total) by mouth daily. (Patient taking differently: Take 10 mg by mouth daily as needed for allergies or rhinitis. ) 30 tablet 11  . fluticasone (FLONASE) 50 MCG/ACT nasal spray SHAKE LIQUID AND USE 2 SPRAYS IN EACH NOSTRIL DAILY (Patient taking differently: Place 2 sprays into both nostrils daily as needed for allergies or rhinitis. ) 16 g 0  . levothyroxine (SYNTHROID, LEVOTHROID) 50 MCG tablet TAKE ONE TABLET EVERY DAY (Patient taking differently: Take 50 mcg by mouth daily before breakfast. ) 90 tablet 0  . losartan (COZAAR) 25 MG tablet Take by mouth.    . metoprolol succinate (TOPROL-XL) 50 MG 24 hr tablet Take 1 tablet (50 mg total) by mouth daily. Take 1 by mouth daily (Patient taking differently: Take 50 mg by mouth daily. ) 90 tablet 1  . venlafaxine XR (EFFEXOR-XR) 150 MG 24 hr capsule TAKE ONE CAPSULE DAILY 90 capsule 3  . vitamin C (ASCORBIC ACID) 500 MG tablet Take 500 mg by mouth daily.     [name] warfarin (COUMADIN) 10 MG tablet Take 1 tablet daily or as directed by anticoagulation clinic (Patient taking differently: Take 10 mg by mouth at bedtime. ) 90 tablet 0  . zolpidem (AMBIEN) 10 MG tablet TAKE 1 TABLET BY MOUTH EVERY DAY AT BEDTIME AS NEEDED (Patient taking differently: Take 10 mg by mouth at bedtime as needed for sleep. ) 90 tablet 1  . zonisamide (ZONEGRAN) 100 MG capsule Take 2 capsules (200 mg total) by mouth at bedtime. 180 capsule 1   No current facility-administered medications on file prior to visit.    Review of Systems Constitutional: Negative for other unusual diaphoresis, sweats, appetite or weight changes HENT: Negative  for other worsening hearing loss, ear pain, facial swelling, mouth sores or neck stiffness.   Eyes: Negative for other worsening pain, redness or other visual disturbance.  Respiratory: Negative for other stridor or swelling Cardiovascular: Negative for other palpitations or other chest pain  Gastrointestinal: Negative for worsening diarrhea or loose stools, blood in stool, distention or other pain Genitourinary: Negative for hematuria, flank pain or other change in urine volume.  Musculoskeletal: Negative for myalgias or other joint swelling.  Skin: Negative for other color change, or other wound or worsening drainage.  Neurological: Negative for other syncope or numbness. Hematological: Negative for other adenopathy or swelling Psychiatric/Behavioral: Negative for hallucinations, other worsening agitation, SI, self-injury, or new decreased concentration All other system neg per pt    Objective:   Physical Exam BP 126/78 (BP Location: Right Arm, Patient Position: Sitting, Cuff Size: Normal)   Pulse 69   Temp 98.4 F (36.9 C) (Oral)   Ht 5\' 8"  (1.727 m)  SpO2 96%   BMI 33.19 kg/m  VS noted, mild ill Constitutional: Pt is oriented to person, place, and time. Appears well-developed and well-nourished, in no significant distress and comfortable Head: Normocephalic and atraumatic  Eyes: Conjunctivae and EOM are normal. Pupils are equal, round, and reactive to light Bilat tm's with mild to mod erythema.  Max sinus areas non tender.  Pharynx with mild erythema, no exudate Right Ear: External ear normal without discharge Left Ear: External ear normal without discharge Nose: Nose without discharge or deformity Mouth/Throat: Oropharynx is without other ulcerations and moist  Neck: Normal range of motion. Neck supple. No JVD present. No tracheal deviation present or significant neck LA or mass Cardiovascular: Normal rate, regular rhythm, normal heart sounds and intact distal pulses.    Pulmonary/Chest: WOB normal and breath sounds without rales or wheezing  Abdominal: Soft. Bowel sounds are normal. NT. No HSM  Musculoskeletal: Normal range of motion. Exhibits no edema Lymphadenopathy: Has no other cervical adenopathy.  Neurological: Pt is alert and oriented to person, place, and time. Pt has normal reflexes. No cranial nerve deficit. Motor grossly intact, Gait intact Skin: Skin is warm and dry. No rash noted or new ulcerations Psychiatric:  Has normal mood and affect. Behavior is normal without agitation No other exam findings Lab Results  Component Value Date   WBC 4.7 07/14/2018   HGB 12.4 07/14/2018   HCT 38.7 07/14/2018   PLT 230 07/14/2018   GLUCOSE 115 (H) 07/05/2018   CHOL 209 (H) 12/13/2011   TRIG 195.0 (H) 12/13/2011   HDL 45.10 12/13/2011   LDLDIRECT 137.4 12/13/2011   LDLCALC 73 02/11/2008   ALT 17 08/14/2017   AST 18 08/14/2017   NA 142 07/05/2018   K 4.2 07/05/2018   CL 109 07/05/2018   CREATININE 0.99 07/05/2018   BUN 17 07/05/2018   CO2 24 07/05/2018   TSH 3.04 10/02/2012   INR 3.6 (A) 08/09/2018   HGBA1C  08/12/2007    5.2 (NOTE)   The ADA recommends the following therapeutic goal for glycemic   control related to Hgb A1C measurement:   Goal of Therapy:   < 7.0% Hgb A1C   Reference: American Diabetes Association: Clinical Practice   Recommendations 2008, Diabetes Care,  2008, 31:(Suppl 1).        Assessment & Plan:

## 2018-08-29 NOTE — Assessment & Plan Note (Signed)

## 2018-08-29 NOTE — Assessment & Plan Note (Signed)
Pt requests cologuard in lieu of colonoscopy

## 2018-08-29 NOTE — Patient Instructions (Addendum)
We will sign you up for having the cologuard.  Please take all new medication as prescribed - the antibiotic  Please continue all other medications as before, and refills have been done if requested.  Please have the pharmacy call with any other refills you may need.  Please continue your efforts at being more active, low cholesterol diet, and weight control.  You are otherwise up to date with prevention measures today.  Please keep your appointments with your specialists as you may have planned

## 2018-08-30 ENCOUNTER — Ambulatory Visit: Payer: Self-pay | Admitting: General Practice

## 2018-09-12 DIAGNOSIS — I480 Paroxysmal atrial fibrillation: Secondary | ICD-10-CM | POA: Diagnosis not present

## 2018-09-12 DIAGNOSIS — Z954 Presence of other heart-valve replacement: Secondary | ICD-10-CM | POA: Diagnosis not present

## 2018-09-26 ENCOUNTER — Other Ambulatory Visit: Payer: Self-pay | Admitting: Internal Medicine

## 2018-09-26 DIAGNOSIS — K580 Irritable bowel syndrome with diarrhea: Secondary | ICD-10-CM

## 2018-09-26 MED ORDER — ACETAMINOPHEN-CODEINE #3 300-30 MG PO TABS: ORAL_TABLET | ORAL | 0 refills | Status: DC

## 2018-09-26 NOTE — Telephone Encounter (Signed)
Requested medication (s) are due for refill today: yes  Requested medication (s) are on the active medication list: yes  Last refill:  08/27/2018  Future visit scheduled: no  Notes to clinic:  Refill cannot be delegated    Requested Prescriptions  Pending Prescriptions Disp Refills   acetaminophen-codeine (TYLENOL #3) 300-30 MG tablet 120 tablet 0    Sig: 1 tab by mouth four times per day as needed     Not Delegated - Analgesics:  Opioid Agonist Combinations Failed - 09/26/2018  3:23 PM      Failed - This refill cannot be delegated      Failed - Urine Drug Screen completed in last 360 days.      Passed - Valid encounter within last 6 months    Recent Outpatient Visits          4 weeks ago Encounter for well adult exam with abnormal findings   [place] [facility], James W, MD   4 months ago Acute upper respiratory infection   [place] [facility], James W, MD   7 months ago Acute upper respiratory infection   [place], James W, MD   8 months ago Acute sinusitis, recurrence not specified, unspecified location   [place] [facility], James W, MD   10 months ago Left otitis media, unspecified otitis media type   [facility] -Georges Mouse, MD

## 2018-09-26 NOTE — Telephone Encounter (Signed)
Medication Refill - Medication: acetaminophen-codeine (TYLENOL #3) 300-30 MG tablet    Preferred Pharmacy (with phone number or street name):  [facility], [place] ([place] [phone] (Phone) [phone] (Fax)

## 2018-09-26 NOTE — Telephone Encounter (Signed)
Done erx 

## 2018-09-30 DIAGNOSIS — Z888 Allergy status to other drugs, medicaments and biological substances status: Secondary | ICD-10-CM | POA: Diagnosis not present

## 2018-09-30 DIAGNOSIS — Z880 Allergy status to penicillin: Secondary | ICD-10-CM | POA: Diagnosis not present

## 2018-09-30 DIAGNOSIS — I10 Essential (primary) hypertension: Secondary | ICD-10-CM | POA: Diagnosis not present

## 2018-09-30 DIAGNOSIS — Z91040 Latex allergy status: Secondary | ICD-10-CM | POA: Diagnosis not present

## 2018-09-30 DIAGNOSIS — Z882 Allergy status to sulfonamides status: Secondary | ICD-10-CM | POA: Diagnosis not present

## 2018-09-30 DIAGNOSIS — F329 Major depressive disorder, single episode, unspecified: Secondary | ICD-10-CM | POA: Diagnosis not present

## 2018-09-30 DIAGNOSIS — Z952 Presence of prosthetic heart valve: Secondary | ICD-10-CM | POA: Diagnosis not present

## 2018-09-30 DIAGNOSIS — Z853 Personal history of malignant neoplasm of breast: Secondary | ICD-10-CM | POA: Diagnosis not present

## 2018-09-30 DIAGNOSIS — Z8543 Personal history of malignant neoplasm of ovary: Secondary | ICD-10-CM | POA: Diagnosis not present

## 2018-09-30 DIAGNOSIS — R569 Unspecified convulsions: Secondary | ICD-10-CM | POA: Diagnosis not present

## 2018-09-30 DIAGNOSIS — F419 Anxiety disorder, unspecified: Secondary | ICD-10-CM | POA: Diagnosis not present

## 2018-09-30 DIAGNOSIS — Z79899 Other long term (current) drug therapy: Secondary | ICD-10-CM | POA: Diagnosis not present

## 2018-09-30 DIAGNOSIS — Z20828 Contact with and (suspected) exposure to other viral communicable diseases: Secondary | ICD-10-CM | POA: Diagnosis not present

## 2018-09-30 DIAGNOSIS — R404 Transient alteration of awareness: Secondary | ICD-10-CM | POA: Diagnosis not present

## 2018-09-30 DIAGNOSIS — Z91013 Allergy to seafood: Secondary | ICD-10-CM | POA: Diagnosis not present

## 2018-09-30 DIAGNOSIS — I422 Other hypertrophic cardiomyopathy: Secondary | ICD-10-CM | POA: Diagnosis not present

## 2018-09-30 DIAGNOSIS — E039 Hypothyroidism, unspecified: Secondary | ICD-10-CM | POA: Diagnosis not present

## 2018-09-30 DIAGNOSIS — R4689 Other symptoms and signs involving appearance and behavior: Secondary | ICD-10-CM | POA: Diagnosis not present

## 2018-10-01 ENCOUNTER — Encounter: Payer: Self-pay | Admitting: Gynecology

## 2018-10-10 DIAGNOSIS — Z954 Presence of other heart-valve replacement: Secondary | ICD-10-CM | POA: Diagnosis not present

## 2018-10-10 DIAGNOSIS — I480 Paroxysmal atrial fibrillation: Secondary | ICD-10-CM | POA: Diagnosis not present

## 2018-10-25 ENCOUNTER — Other Ambulatory Visit: Payer: Self-pay | Admitting: Internal Medicine

## 2018-10-25 DIAGNOSIS — K580 Irritable bowel syndrome with diarrhea: Secondary | ICD-10-CM

## 2018-10-25 MED ORDER — ACETAMINOPHEN-CODEINE #3 300-30 MG PO TABS: ORAL_TABLET | ORAL | 0 refills | Status: DC

## 2018-10-25 NOTE — Telephone Encounter (Signed)
Done erx 

## 2018-10-25 NOTE — Telephone Encounter (Signed)
 Controlled Substance Database checked. Last filled on 09/26/18. Last OV 08/29/18

## 2018-10-25 NOTE — Telephone Encounter (Signed)
Requested medication (s) are due for refill today: yes  Requested medication (s) are on the active medication list: yes  Last refill:  09/26/2018  Future visit scheduled: no  Notes to clinic:  Refill cannot be delegated    Requested Prescriptions  Pending Prescriptions Disp Refills   acetaminophen-codeine (TYLENOL #3) 300-30 MG tablet 120 tablet 0    Sig: 1 tab by mouth four times per day as needed     Not Delegated - Analgesics:  Opioid Agonist Combinations Failed - 10/25/2018  1:59 PM      Failed - This refill cannot be delegated      Failed - Urine Drug Screen completed in last 360 days.      Passed - Valid encounter within last 6 months    Recent Outpatient Visits          1 month ago Encounter for well adult exam with abnormal findings   [place] [facility], James W, MD   5 months ago Acute upper respiratory infection   [place] John, James W, MD   8 months ago Acute upper respiratory infection   [place], James W, MD   9 months ago Acute sinusitis, recurrence not specified, unspecified location   [place] John, James W, MD   11 months ago Left otitis media, unspecified otitis media type   [facility] -Georges Mouse, MD

## 2018-10-25 NOTE — Telephone Encounter (Signed)
Copied from [place] [id number]. Topic: Quick Communication - Rx Refill/Question >> Oct 25, 2018  1:54 PM Rainey Pines A wrote: Medication: acetaminophen-codeine (TYLENOL #3) 300-30 MG tablet (Patient is completely out and pharmacy has asked that patient contact office to get medication sent over.)  Has the patient contacted their pharmacy? Yes (Agent: If no, request that the patient contact the pharmacy for the refill.) (Agent: If yes, when and what did the pharmacy advise?)Contact PCP  Preferred Pharmacy (with phone number or street name): [facility] [place], [place] ([address] [phone] (Phone) [phone] (Fax    Agent: Please be advised that RX refills may take up to 3 business days. We ask that you follow-up with your pharmacy.

## 2018-11-07 DIAGNOSIS — Z954 Presence of other heart-valve replacement: Secondary | ICD-10-CM | POA: Diagnosis not present

## 2018-11-07 DIAGNOSIS — I480 Paroxysmal atrial fibrillation: Secondary | ICD-10-CM | POA: Diagnosis not present

## 2018-11-21 ENCOUNTER — Ambulatory Visit (INDEPENDENT_AMBULATORY_CARE_PROVIDER_SITE_OTHER): Payer: BC Managed Care – PPO | Admitting: Internal Medicine

## 2018-11-21 ENCOUNTER — Other Ambulatory Visit: Payer: Self-pay

## 2018-11-21 DIAGNOSIS — J328 Other chronic sinusitis: Secondary | ICD-10-CM | POA: Diagnosis not present

## 2018-11-21 DIAGNOSIS — K580 Irritable bowel syndrome with diarrhea: Secondary | ICD-10-CM

## 2018-11-21 DIAGNOSIS — F411 Generalized anxiety disorder: Secondary | ICD-10-CM

## 2018-11-21 MED ORDER — CEFUROXIME AXETIL 250 MG PO TABS: 250.0000 mg | ORAL_TABLET | Freq: Two times a day (BID) | ORAL | 1 refills | Status: AC

## 2018-11-21 MED ORDER — ACETAMINOPHEN-CODEINE #3 300-30 MG PO TABS: ORAL_TABLET | ORAL | 0 refills | Status: DC

## 2018-11-21 NOTE — Progress Notes (Addendum)
Patient ID: [name], female   DOB: December 06, 1955, 63 y.o.   MRN: [id number]  Virtual Visit via Video Note  I connected with Omer Jack on 11/21/18 at 11:20 AM EST by a video enabled telemedicine application and verified that I am speaking with the correct person using two identifiers.  Location: Patient: at home Provider: at office   I discussed the limitations of evaluation and management by telemedicine and the availability of in person appointments. The patient expressed understanding and agreed to proceed.  History of Present Illness:  Here with 2-3 days acute onset fever, facial pain, pressure, headache, general weakness and malaise, and greenish d/c, with mild ST and cough, but pt denies chest pain, wheezing, increased sob or doe, orthopnea, PND, increased LE swelling, palpitations, dizziness or syncope.  Denies worsening reflux, abd pain, dysphagia, n/v, bowel change or blood, except for chornic diarrhea, needs refill tylenol #3.  Pt denies new neurological symptoms such as new headache, or facial or extremity weakness or numbness   Pt denies polydipsia, polyuria,  Denies worsening depressive symptoms, suicidal ideation, or panic Past Medical History:  Diagnosis Date  . Abdominal pain, epigastric 10/25/2007  . ADD 01/20/2009  . ALLERGIC RHINITIS 10/15/2006  . ANGIOEDEMA 08/12/2008  . ANXIETY 10/15/2006  . ASTHMATIC BRONCHITIS, ACUTE 01/27/2008  . Cellulitis and abscess of leg, except foot 08/12/2007  . CELLULITIS, [place] 12/21/2008  . CHEST PAIN 06/14/2007  . Chronic anticoagulation 05/16/2016  . Chronic sinus infection 04/12/2010  . COLECTOMY, PARTIAL, WITH ANASTOMOSIS, HX OF 10/15/2006  . COLONIC POLYPS, HX OF 10/15/2006  . CYST, OVARIAN NEC/NOS 10/15/2006  . Dengue 02/22/2010  . DVT, HX OF 10/15/2006  . DYSAUTONOMIA 10/15/2006  . Dysfunction of eustachian tube 05/26/2009  . GI BLEEDING 04/04/2007  . Glossitis 03/06/2008  . HEADACHE, CHRONIC 04/04/2007  . Heart murmur   .  Hematemesis 10/25/2007  . HEMORRHOIDS 04/04/2007  . HYPERLIPIDEMIA 12/12/2007  . HYPERTENSION 10/15/2006  . HYPOTHYROIDISM 03/06/2008  . IBS 04/04/2007  . MASTECTOMY, BILATERAL, HX OF 08/12/2009  . NECK MASS 07/01/2007  . NEOP, MALIGNANT, FEMALE BREAST NOS 10/15/2006  . 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 SYNCOPE 07/01/2007  . Wheezing 08/20/2008  . WOUND, OPEN, LEG, WITHOUT COMPLICATION [id number]   Past Surgical History:  Procedure Laterality Date  . ABDOMINAL HYSTERECTOMY    . bilateral mastectomy/reconstruction    . CESAREAN SECTION    . CHOLECYSTECTOMY    . COLONOSCOPY W/ BIOPSIES    . FLEXIBLE SIGMOIDOSCOPY    . LEFT AND RIGHT HEART CATHETERIZATION WITH CORONARY ANGIOGRAM N/A 10/07/2012   Procedure: LEFT AND RIGHT HEART CATHETERIZATION WITH CORONARY ANGIOGRAM;  Surgeon: Jolaine Artist, MD;  Location: [facility] CATH LAB;  Service: Cardiovascular;  Laterality: N/A;  . multiple ablations    . OOPHORECTOMY    . open heart surg    . spigellian hernia      reports that she has never smoked. She has never used smokeless tobacco. She reports that she does not drink alcohol or use drugs. family history includes Cancer in her sister; Heart disease in her mother; Pancreatic cancer in her father. Allergies  Allergen Reactions  . Fish Allergy Anaphylaxis and Swelling    Scaled fish, like flounder  . Penicillins Anaphylaxis and Hives    Did it involve swelling of the face/tongue/throat, SOB, or low BP? Yes Did it involve sudden or severe rash/hives, skin peeling, or any reaction on the inside of your mouth or nose? Unk Did you need to  seek medical attention at a hospital or doctor's office? No When did it last happen? "I was 63 years old" If all above answers are "NO", may proceed with cephalosporin use.   [name] [Levofloxacin In D5w] Nausea And Vomiting  . Latex Dermatitis and Rash    If worn for more than a day, this causes blisters    Current Outpatient Medications on File Prior to  Visit  Medication Sig Dispense Refill  . Black Elderberry (SAMBUCUS ELDERBERRY PO) Take 1 tablet by mouth daily. Dissolvable ELDERBERRY/VITAMIN C/ZINC fizzy tablet    . cetirizine (ZYRTEC) 10 MG tablet Take 1 tablet (10 mg total) by mouth daily. (Patient taking differently: Take 10 mg by mouth daily as needed for allergies or rhinitis. ) 30 tablet 11  . fluticasone (FLONASE) 50 MCG/ACT nasal spray SHAKE LIQUID AND USE 2 SPRAYS IN EACH NOSTRIL DAILY (Patient taking differently: Place 2 sprays into both nostrils daily as needed for allergies or rhinitis. ) 16 g 0  . losartan (COZAAR) 25 MG tablet Take by mouth.    . metoprolol succinate (TOPROL-XL) 50 MG 24 hr tablet Take 1 tablet (50 mg total) by mouth daily. Take 1 by mouth daily (Patient taking differently: Take 50 mg by mouth daily. ) 90 tablet 1  . venlafaxine XR (EFFEXOR-XR) 150 MG 24 hr capsule TAKE ONE CAPSULE DAILY 90 capsule 3  . vitamin C (ASCORBIC ACID) 500 MG tablet Take 500 mg by mouth daily.     [name] warfarin (COUMADIN) 10 MG tablet Take 1 tablet daily or as directed by anticoagulation clinic (Patient taking differently: Take 10 mg by mouth at bedtime. ) 90 tablet 0  . zolpidem (AMBIEN) 10 MG tablet TAKE 1 TABLET BY MOUTH EVERY DAY AT BEDTIME AS NEEDED (Patient taking differently: Take 10 mg by mouth at bedtime as needed for sleep. ) 90 tablet 1  . zonisamide (ZONEGRAN) 100 MG capsule Take 2 capsules (200 mg total) by mouth at bedtime. 180 capsule 1   No current facility-administered medications on file prior to visit.     Observations/Objective: Alert, NAD, appropriate mood and affect, resps normal, cn 2-12 intact, moves all 4s, no visible rash or swelling Lab Results  Component Value Date   WBC 4.7 07/14/2018   HGB 12.4 07/14/2018   HCT 38.7 07/14/2018   PLT 230 07/14/2018   GLUCOSE 115 (H) 07/05/2018   CHOL 209 (H) 12/13/2011   TRIG 195.0 (H) 12/13/2011   HDL 45.10 12/13/2011   LDLDIRECT 137.4 12/13/2011   LDLCALC 73  02/11/2008   ALT 17 08/14/2017   AST 18 08/14/2017   NA 142 07/05/2018   K 4.2 07/05/2018   CL 109 07/05/2018   CREATININE 0.99 07/05/2018   BUN 17 07/05/2018   CO2 24 07/05/2018   TSH 3.04 10/02/2012   INR 3.6 (A) 08/09/2018   HGBA1C  08/12/2007    5.2 (NOTE)   The ADA recommends the following therapeutic goal for glycemic   control related to Hgb A1C measurement:   Goal of Therapy:   < 7.0% Hgb A1C   Reference: American Diabetes Association: Clinical Practice   Recommendations 2008, Diabetes Care,  2008, 31:(Suppl 1).    Assessment and Plan: See notes  Follow Up Instructions: See notes   I discussed the assessment and treatment plan with the patient. The patient was provided an opportunity to ask questions and all were answered. The patient agreed with the plan and demonstrated an understanding of the instructions.   The  patient was advised to call back or seek an in-person evaluation if the symptoms worsen or if the condition fails to improve as anticipated.   Cathlean Cower, MD

## 2018-11-22 ENCOUNTER — Other Ambulatory Visit: Payer: Self-pay | Admitting: Internal Medicine

## 2018-11-23 ENCOUNTER — Encounter: Payer: Self-pay | Admitting: Internal Medicine

## 2018-11-23 NOTE — Patient Instructions (Signed)
Please take all new medication as prescribed  Please continue all other medications as before, and refills have been done if requested.  Please have the pharmacy call with any other refills you may need.  Please continue your efforts at being more active, low cholesterol diet, and weight control.  Please keep your appointments with your specialists as you may have planned    

## 2018-11-23 NOTE — Assessment & Plan Note (Signed)
With chroinc diarrhea activity limiting, for refill tylenol #3

## 2018-11-23 NOTE — Assessment & Plan Note (Signed)
With flare, Mild to mod, for antibx course,  to f/u any worsening symptoms or concerns

## 2018-11-23 NOTE — Assessment & Plan Note (Signed)
stable overall by history and exam, recent data reviewed with pt, and pt to continue medical treatment as before,  to f/u any worsening symptoms or concerns  

## 2018-12-05 DIAGNOSIS — Z954 Presence of other heart-valve replacement: Secondary | ICD-10-CM | POA: Diagnosis not present

## 2018-12-05 DIAGNOSIS — I480 Paroxysmal atrial fibrillation: Secondary | ICD-10-CM | POA: Diagnosis not present

## 2018-12-19 DIAGNOSIS — Z954 Presence of other heart-valve replacement: Secondary | ICD-10-CM | POA: Diagnosis not present

## 2018-12-19 DIAGNOSIS — I480 Paroxysmal atrial fibrillation: Secondary | ICD-10-CM | POA: Diagnosis not present

## 2018-12-19 DIAGNOSIS — Z7901 Long term (current) use of anticoagulants: Secondary | ICD-10-CM | POA: Diagnosis not present

## 2018-12-23 ENCOUNTER — Other Ambulatory Visit: Payer: Self-pay | Admitting: Internal Medicine

## 2018-12-23 DIAGNOSIS — K580 Irritable bowel syndrome with diarrhea: Secondary | ICD-10-CM

## 2018-12-23 MED ORDER — VENLAFAXINE HCL ER 150 MG PO CP24: 150.0000 mg | ORAL_CAPSULE | Freq: Every day | ORAL | 2 refills | Status: DC

## 2018-12-23 NOTE — Telephone Encounter (Signed)
Requested medication (s) are due for refill today: {Yes  Requested medication (s) are on the active medication list: yes  Last refill:  11/21/18  Future visit scheduled: no  Notes to clinic:  medication not delegated to NT to refill   Requested Prescriptions  Pending Prescriptions Disp Refills   acetaminophen-codeine (TYLENOL #3) 300-30 MG tablet 120 tablet     Sig: 1 tab by mouth four times per day as needed      Not Delegated - Analgesics:  Opioid Agonist Combinations Failed - 12/23/2018 11:45 AM      Failed - This refill cannot be delegated      Failed - Urine Drug Screen completed in last 360 days.      Passed - Valid encounter within last 6 months    Recent Outpatient Visits           1 month ago Other chronic sinusitis   [place] [name], Jessica W, MD   3 months ago Encounter for well adult exam with abnormal findings   [place] [name], Jessica W, MD   7 months ago Acute upper respiratory infection   [place] [name], Jessica W, MD   10 months ago Acute upper respiratory infection   [facility] -Georges Mouse, MD   11 months ago Acute sinusitis, recurrence not specified, unspecified location   [place], MD               Signed Prescriptions Disp Refills   venlafaxine XR (EFFEXOR-XR) 150 MG 24 hr capsule 90 capsule 2    Sig: Take 1 capsule (150 mg total) by mouth daily.      Psychiatry: Antidepressants - SNRI - desvenlafaxine & venlafaxine Failed - 12/23/2018 11:45 AM      Failed - LDL in normal range and within 360 days    LDL Cholesterol  Date Value Ref Range Status  02/11/2008 73 0 - 99 mg/dL Final   Direct LDL  Date Value Ref Range Status  12/13/2011 137.4 mg/dL Final    Comment:    Optimal:  <100 mg/dLNear or Above Optimal:  100-129 mg/dLBorderline High:  130-159 mg/dLHigh:  160-189 mg/dLVery High:  >190 mg/dL           Failed - Total Cholesterol in normal range and within 360 days    Cholesterol  Date Value Ref Range Status  12/13/2011 209 (H) 0 - 200 mg/dL Final    Comment:    ATP III Classification       Desirable:  < 200 mg/dL               Borderline High:  200 - 239 mg/dL          High:  > = 240 mg/dL          Failed - Triglycerides in normal range and within 360 days    Triglycerides  Date Value Ref Range Status  12/13/2011 195.0 (H) 0.0 - 149.0 mg/dL Final    Comment:    Normal:  <150 mg/dLBorderline High:  150 - 199 mg/dL          Passed - Completed PHQ-2 or PHQ-9 in the last 360 days.      Passed - Last BP in normal range    BP Readings from Last 1 Encounters:  08/29/18 126/78          Passed -  Valid encounter within last 6 months    Recent Outpatient Visits           1 month ago Other chronic sinusitis   [place] [name], Jessica W, MD   3 months ago Encounter for well adult exam with abnormal findings   [place] [name], Jessica W, MD   7 months ago Acute upper respiratory infection   [place], MD   10 months ago Acute upper respiratory infection   [facility] -Georges Mouse, MD   11 months ago Acute sinusitis, recurrence not specified, unspecified location   [facility] -Georges Mouse, MD

## 2018-12-23 NOTE — Telephone Encounter (Signed)
Requested Prescriptions  Pending Prescriptions Disp Refills  . acetaminophen-codeine (TYLENOL #3) 300-30 MG tablet 120 tablet     Sig: 1 tab by mouth four times per day as needed     Not Delegated - Analgesics:  Opioid Agonist Combinations Failed - 12/23/2018 11:45 AM      Failed - This refill cannot be delegated      Failed - Urine Drug Screen completed in last 360 days.      Passed - Valid encounter within last 6 months    Recent Outpatient Visits          1 month ago Other chronic sinusitis   [place] John, James W, MD   3 months ago Encounter for well adult exam with abnormal findings   [place] John, James W, MD   7 months ago Acute upper respiratory infection   [place] John, James W, MD   10 months ago Acute upper respiratory infection   [place], James W, MD   11 months ago Acute sinusitis, recurrence not specified, unspecified location   [place] John, James W, MD             . venlafaxine XR (EFFEXOR-XR) 150 MG 24 hr capsule 90 capsule 2    Sig: Take 1 capsule (150 mg total) by mouth daily.     Psychiatry: Antidepressants - SNRI - desvenlafaxine & venlafaxine Failed - 12/23/2018 11:45 AM      Failed - LDL in normal range and within 360 days    LDL Cholesterol  Date Value Ref Range Status  02/11/2008 73 0 - 99 mg/dL Final   Direct LDL  Date Value Ref Range Status  12/13/2011 137.4 mg/dL Final    Comment:    Optimal:  <100 mg/dLNear or Above Optimal:  100-129 mg/dLBorderline High:  130-159 mg/dLHigh:  160-189 mg/dLVery High:  >190 mg/dL         Failed - Total Cholesterol in normal range and within 360 days    Cholesterol  Date Value Ref Range Status  12/13/2011 209 (H) 0 - 200 mg/dL Final    Comment:    ATP III Classification       Desirable:  < 200 mg/dL               Borderline High:  200 - 239 mg/dL           High:  > = 240 mg/dL         Failed - Triglycerides in normal range and within 360 days    Triglycerides  Date Value Ref Range Status  12/13/2011 195.0 (H) 0.0 - 149.0 mg/dL Final    Comment:    Normal:  <150 mg/dLBorderline High:  150 - 199 mg/dL         Passed - Completed PHQ-2 or PHQ-9 in the last 360 days.      Passed - Last BP in normal range    BP Readings from Last 1 Encounters:  08/29/18 126/78         Passed - Valid encounter within last 6 months    Recent Outpatient Visits          1 month ago Other chronic sinusitis   [place] John, James W, MD   3 months ago Encounter for well adult exam with abnormal findings   [place]  Biagio Borg, MD   7 months ago Acute upper respiratory infection   [place] John, James W, MD   10 months ago Acute upper respiratory infection   [facility] -Georges Mouse, MD   11 months ago Acute sinusitis, recurrence not specified, unspecified location   [facility] -Georges Mouse, MD

## 2018-12-23 NOTE — Telephone Encounter (Signed)
Copied from [place] [id number]. Topic: Quick Communication - Rx Refill/Question >> Dec 23, 2018 11:38 AM Rainey Pines A wrote: Medication: acetaminophen-codeine (TYLENOL #3) 300-30 MG tablet, ([facility]  venlafaxine XR (EFFEXOR-XR) 150 MG 24 hr capsule,levothyroxine (SYNTHROID) 50 MCG tablet(The other two medications need to be sent to the [facility] pharmacy.) [place], [place]  Phone:  [phone] Fax:  [phone]     Has the patient contacted their pharmacy? Yes (Agent: If no, request that the patient contact the pharmacy for the refill.) (Agent: If yes, when and what did the pharmacy advise?)Contact pcp   Preferred Pharmacy (with phone number or street name): [facility] [place], [place] ([address]  Phone:  [phone] Fax:  [phone]     Agent: Please be advised that RX refills may take up to 3 business days. We ask that you follow-up with your pharmacy.

## 2018-12-24 MED ORDER — ACETAMINOPHEN-CODEINE #3 300-30 MG PO TABS: ORAL_TABLET | ORAL | 0 refills | Status: DC

## 2018-12-24 NOTE — Telephone Encounter (Signed)
Done erx 

## 2018-12-25 DIAGNOSIS — Z954 Presence of other heart-valve replacement: Secondary | ICD-10-CM | POA: Diagnosis not present

## 2018-12-25 DIAGNOSIS — I480 Paroxysmal atrial fibrillation: Secondary | ICD-10-CM | POA: Diagnosis not present

## 2018-12-25 DIAGNOSIS — Z7901 Long term (current) use of anticoagulants: Secondary | ICD-10-CM | POA: Diagnosis not present

## 2018-12-25 DIAGNOSIS — I422 Other hypertrophic cardiomyopathy: Secondary | ICD-10-CM | POA: Diagnosis not present

## 2019-01-01 DIAGNOSIS — I480 Paroxysmal atrial fibrillation: Secondary | ICD-10-CM | POA: Diagnosis not present

## 2019-01-01 DIAGNOSIS — Z954 Presence of other heart-valve replacement: Secondary | ICD-10-CM | POA: Diagnosis not present

## 2019-01-01 DIAGNOSIS — Z7901 Long term (current) use of anticoagulants: Secondary | ICD-10-CM | POA: Diagnosis not present

## 2019-01-02 DIAGNOSIS — I480 Paroxysmal atrial fibrillation: Secondary | ICD-10-CM | POA: Diagnosis not present

## 2019-01-02 DIAGNOSIS — Z954 Presence of other heart-valve replacement: Secondary | ICD-10-CM | POA: Diagnosis not present

## 2019-01-09 ENCOUNTER — Other Ambulatory Visit: Payer: Self-pay

## 2019-01-09 ENCOUNTER — Ambulatory Visit (INDEPENDENT_AMBULATORY_CARE_PROVIDER_SITE_OTHER): Payer: BLUE CROSS/BLUE SHIELD | Admitting: Internal Medicine

## 2019-01-09 ENCOUNTER — Encounter: Payer: Self-pay | Admitting: Internal Medicine

## 2019-01-09 ENCOUNTER — Telehealth: Payer: Self-pay

## 2019-01-09 DIAGNOSIS — J309 Allergic rhinitis, unspecified: Secondary | ICD-10-CM | POA: Diagnosis not present

## 2019-01-09 DIAGNOSIS — Z954 Presence of other heart-valve replacement: Secondary | ICD-10-CM | POA: Diagnosis not present

## 2019-01-09 DIAGNOSIS — J328 Other chronic sinusitis: Secondary | ICD-10-CM | POA: Diagnosis not present

## 2019-01-09 DIAGNOSIS — Z7901 Long term (current) use of anticoagulants: Secondary | ICD-10-CM | POA: Diagnosis not present

## 2019-01-09 DIAGNOSIS — I480 Paroxysmal atrial fibrillation: Secondary | ICD-10-CM | POA: Diagnosis not present

## 2019-01-09 MED ORDER — CEFUROXIME AXETIL 250 MG PO TABS: 250.0000 mg | ORAL_TABLET | Freq: Two times a day (BID) | ORAL | 1 refills | Status: AC

## 2019-01-09 NOTE — Telephone Encounter (Signed)
Copied from [place] [id number]. Topic: Appointment Scheduling - Scheduling Inquiry for Clinic >> Jan 08, 2019  5:02 PM Erick Blinks wrote: [phone] Best contact Pt needs an appt as soon as possible, she has an ear infection and cold symptoms. She was exposed to Covid over the weekend. Please advise    Appt scheduled today with Dr. [name] @ 10:20am

## 2019-01-09 NOTE — Telephone Encounter (Signed)
Just saw pt has virtual appt this afternoon.Marland KitchenJohny Chess

## 2019-01-09 NOTE — Telephone Encounter (Signed)
Per office policy due to her sxs, and exposure to Covid pt must make an virtual appt. Pls call pt to schedule virtual. Pls inform pt we are not bringing in any patients that have cold, sinus, or flu like sxs.Marland KitchenMarland Kitchen

## 2019-01-09 NOTE — Progress Notes (Signed)
Patient ID: [name], female   DOB: 28-Jun-1955, 64 y.o.   MRN: [id number]  Virtual Visit via Video Note  I connected with Omer Jack on 01/09/19 at 10:20 AM EST by a video enabled telemedicine application and verified that I am speaking with the correct person using two identifiers.  Location: Patient: at home Provider: at office   I discussed the limitations of evaluation and management by telemedicine and the availability of in person appointments. The patient expressed understanding and agreed to proceed.  History of Present Illness:  Here with 2-3 days acute onset fever, left ear pain severe, and facial pain, pressure, headache, general weakness and malaise, and greenish d/c, with mild ST and cough, but pt denies chest pain, wheezing, increased sob or doe, orthopnea, PND, increased LE swelling, palpitations, dizziness or syncope. Pt denies new neurological symptoms such as new headache, or facial or extremity weakness or numbness   Pt denies polydipsia, polyuria, Good compliance with allergy meds and not helping, Mucinex not helping.  Denies worsening depressive symptoms, suicidal ideation, or panic; has ongoing anxiety Past Medical History:  Diagnosis Date  . Abdominal pain, epigastric 10/25/2007  . ADD 01/20/2009  . ALLERGIC RHINITIS 10/15/2006  . ANGIOEDEMA 08/12/2008  . ANXIETY 10/15/2006  . ASTHMATIC BRONCHITIS, ACUTE 01/27/2008  . Cellulitis and abscess of leg, except foot 08/12/2007  . CELLULITIS, [place] 12/21/2008  . CHEST PAIN 06/14/2007  . Chronic anticoagulation 05/16/2016  . Chronic sinus infection 04/12/2010  . COLECTOMY, PARTIAL, WITH ANASTOMOSIS, HX OF 10/15/2006  . COLONIC POLYPS, HX OF 10/15/2006  . CYST, OVARIAN NEC/NOS 10/15/2006  . Dengue 02/22/2010  . DVT, HX OF 10/15/2006  . DYSAUTONOMIA 10/15/2006  . Dysfunction of eustachian tube 05/26/2009  . GI BLEEDING 04/04/2007  . Glossitis 03/06/2008  . HEADACHE, CHRONIC 04/04/2007  . Heart murmur   . Hematemesis  10/25/2007  . HEMORRHOIDS 04/04/2007  . HYPERLIPIDEMIA 12/12/2007  . HYPERTENSION 10/15/2006  . HYPOTHYROIDISM 03/06/2008  . IBS 04/04/2007  . MASTECTOMY, BILATERAL, HX OF 08/12/2009  . NECK MASS 07/01/2007  . NEOP, MALIGNANT, FEMALE BREAST NOS 10/15/2006  . 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 SYNCOPE 07/01/2007  . Wheezing 08/20/2008  . WOUND, OPEN, LEG, WITHOUT COMPLICATION [id number]   Past Surgical History:  Procedure Laterality Date  . ABDOMINAL HYSTERECTOMY    . bilateral mastectomy/reconstruction    . CESAREAN SECTION    . CHOLECYSTECTOMY    . COLONOSCOPY W/ BIOPSIES    . FLEXIBLE SIGMOIDOSCOPY    . LEFT AND RIGHT HEART CATHETERIZATION WITH CORONARY ANGIOGRAM N/A 10/07/2012   Procedure: LEFT AND RIGHT HEART CATHETERIZATION WITH CORONARY ANGIOGRAM;  Surgeon: Jolaine Artist, MD;  Location[facility] C[facility] LAB;  Service: Cardiovascular;  Laterality: N/A;  . multiple ablations    . OOPHORECTOMY    . open heart surg    . spigellian hernia      reports that she has never smoked. She has never used smokeless tobacco. She reports that she does not drink alcohol or use drugs. family history includes Cancer in her sister; Heart disease in her mother; Pancreatic cancer in her father. Allergies  Allergen Reactions  . Fish Allergy Anaphylaxis and Swelling    Scaled fish, like flounder  . Penicillins Anaphylaxis and Hives    Did it involve swelling of the face/tongue/throat, SOB, or low BP? Yes Did it involve sudden or severe rash/hives, skin peeling, or any reaction on the inside of your mouth or nose? Unk Did you need to seek medical  attention at a hospital or doctor's office? No When did it last happen? "I was 64 years old" If all above answers are "NO", may proceed with cephalosporin use.   [name] [Levofloxacin In D5w] Nausea And Vomiting  . Latex Dermatitis and Rash    If worn for more than a day, this causes blisters    Current Outpatient Medications on File Prior to Visit   Medication Sig Dispense Refill  . acetaminophen-codeine (TYLENOL #3) 300-30 MG tablet 1 tab by mouth four times per day as needed 120 tablet 0  . Black Elderberry (SAMBUCUS ELDERBERRY PO) Take 1 tablet by mouth daily. Dissolvable ELDERBERRY/VITAMIN C/ZINC fizzy tablet    . levothyroxine (SYNTHROID) 50 MCG tablet TAKE ONE TABLET EVERY DAY 90 tablet 0  . losartan (COZAAR) 25 MG tablet Take by mouth.    . metoprolol succinate (TOPROL-XL) 50 MG 24 hr tablet Take 1 tablet (50 mg total) by mouth daily. Take 1 by mouth daily (Patient taking differently: Take 50 mg by mouth daily. ) 90 tablet 1  . venlafaxine XR (EFFEXOR-XR) 150 MG 24 hr capsule Take 1 capsule (150 mg total) by mouth daily. 90 capsule 2  . vitamin C (ASCORBIC ACID) 500 MG tablet Take 500 mg by mouth daily.     [name] warfarin (COUMADIN) 10 MG tablet Take 1 tablet daily or as directed by anticoagulation clinic (Patient taking differently: Take 10 mg by mouth at bedtime. ) 90 tablet 0  . zolpidem (AMBIEN) 10 MG tablet TAKE 1 TABLET BY MOUTH EVERY DAY AT BEDTIME AS NEEDED (Patient taking differently: Take 10 mg by mouth at bedtime as needed for sleep. ) 90 tablet 1  . zonisamide (ZONEGRAN) 100 MG capsule Take 2 capsules (200 mg total) by mouth at bedtime. 180 capsule 1  . cetirizine (ZYRTEC) 10 MG tablet Take 1 tablet (10 mg total) by mouth daily. (Patient taking differently: Take 10 mg by mouth daily as needed for allergies or rhinitis. ) 30 tablet 11  . fluticasone (FLONASE) 50 MCG/ACT nasal spray SHAKE LIQUID AND USE 2 SPRAYS IN EACH NOSTRIL DAILY (Patient not taking: No sig reported) 16 g 0   No current facility-administered medications on file prior to visit.    Observations/Objective: Alert, NAD, appropriate mood and affect, resps normal, cn 2-12 intact, moves all 4s, no visible rash or swelling Lab Results  Component Value Date   WBC 4.7 07/14/2018   HGB 12.4 07/14/2018   HCT 38.7 07/14/2018   PLT 230 07/14/2018   GLUCOSE 115 (H)  07/05/2018   CHOL 209 (H) 12/13/2011   TRIG 195.0 (H) 12/13/2011   HDL 45.10 12/13/2011   LDLDIRECT 137.4 12/13/2011   LDLCALC 73 02/11/2008   ALT 17 08/14/2017   AST 18 08/14/2017   NA 142 07/05/2018   K 4.2 07/05/2018   CL 109 07/05/2018   CREATININE 0.99 07/05/2018   BUN 17 07/05/2018   CO2 24 07/05/2018   TSH 3.04 10/02/2012   INR 3.6 (A) 08/09/2018   HGBA1C  08/12/2007    5.2 (NOTE)   The ADA recommends the following therapeutic goal for glycemic   control related to Hgb A1C measurement:   Goal of Therapy:   < 7.0% Hgb A1C   Reference: American Diabetes Association: Clinical Practice   Recommendations 2008, Diabetes Care,  2008, 31:(Suppl 1).   Assessment and Plan: See notes  Follow Up Instructions: See notes   I discussed the assessment and treatment plan with the patient. The patient was  provided an opportunity to ask questions and all were answered. The patient agreed with the plan and demonstrated an understanding of the instructions.   The patient was advised to call back or seek an in-person evaluation if the symptoms worsen or if the condition fails to improve as anticipated.   Cathlean Cower, MD

## 2019-01-10 DIAGNOSIS — R05 Cough: Secondary | ICD-10-CM | POA: Diagnosis not present

## 2019-01-10 DIAGNOSIS — R070 Pain in throat: Secondary | ICD-10-CM | POA: Diagnosis not present

## 2019-01-10 DIAGNOSIS — Z20822 Contact with and (suspected) exposure to covid-19: Secondary | ICD-10-CM | POA: Diagnosis not present

## 2019-01-10 DIAGNOSIS — R197 Diarrhea, unspecified: Secondary | ICD-10-CM | POA: Diagnosis not present

## 2019-01-11 ENCOUNTER — Encounter: Payer: Self-pay | Admitting: Internal Medicine

## 2019-01-11 NOTE — Assessment & Plan Note (Signed)
With flare - Mild to mod, for antibx course,  to f/u any worsening symptoms or concerns

## 2019-01-11 NOTE — Assessment & Plan Note (Signed)
stable overall by history and exam, recent data reviewed with pt, and pt to continue medical treatment as before,  to f/u any worsening symptoms or concerns  

## 2019-01-11 NOTE — Patient Instructions (Signed)
Please take all new medication as prescribed 

## 2019-01-14 ENCOUNTER — Telehealth: Payer: Self-pay | Admitting: *Deleted

## 2019-01-14 NOTE — Telephone Encounter (Signed)
Copied from [place] [id number]. Topic: General - Inquiry >> Jan 13, 2019  5:57 PM [name] wrote: Reason for CRM: Patient husband called in regarding his wifes current state. He would like a call back from office

## 2019-01-14 NOTE — Telephone Encounter (Signed)
Called pt to f/u on msg below. Spoke w/pt she states she was able to get some steroids sent in and is helping with her symptoms.Marland KitchenJohny [name]

## 2019-01-21 ENCOUNTER — Other Ambulatory Visit: Payer: Self-pay | Admitting: Internal Medicine

## 2019-01-21 DIAGNOSIS — R4689 Other symptoms and signs involving appearance and behavior: Secondary | ICD-10-CM | POA: Diagnosis not present

## 2019-01-21 DIAGNOSIS — R42 Dizziness and giddiness: Secondary | ICD-10-CM | POA: Diagnosis not present

## 2019-01-21 DIAGNOSIS — I422 Other hypertrophic cardiomyopathy: Secondary | ICD-10-CM | POA: Diagnosis not present

## 2019-01-21 DIAGNOSIS — I480 Paroxysmal atrial fibrillation: Secondary | ICD-10-CM | POA: Diagnosis not present

## 2019-01-21 DIAGNOSIS — Z954 Presence of other heart-valve replacement: Secondary | ICD-10-CM | POA: Diagnosis not present

## 2019-01-21 DIAGNOSIS — Z7901 Long term (current) use of anticoagulants: Secondary | ICD-10-CM | POA: Diagnosis not present

## 2019-01-21 DIAGNOSIS — K580 Irritable bowel syndrome with diarrhea: Secondary | ICD-10-CM

## 2019-01-22 NOTE — Telephone Encounter (Signed)
Done erx 

## 2019-01-23 ENCOUNTER — Encounter: Payer: Self-pay | Admitting: Internal Medicine

## 2019-01-28 ENCOUNTER — Encounter: Payer: Self-pay | Admitting: Internal Medicine

## 2019-01-28 DIAGNOSIS — J189 Pneumonia, unspecified organism: Secondary | ICD-10-CM | POA: Diagnosis not present

## 2019-01-29 DIAGNOSIS — I480 Paroxysmal atrial fibrillation: Secondary | ICD-10-CM | POA: Diagnosis not present

## 2019-01-29 DIAGNOSIS — Z7901 Long term (current) use of anticoagulants: Secondary | ICD-10-CM | POA: Diagnosis not present

## 2019-01-29 DIAGNOSIS — Z954 Presence of other heart-valve replacement: Secondary | ICD-10-CM | POA: Diagnosis not present

## 2019-01-30 DIAGNOSIS — I480 Paroxysmal atrial fibrillation: Secondary | ICD-10-CM | POA: Diagnosis not present

## 2019-01-30 DIAGNOSIS — Z954 Presence of other heart-valve replacement: Secondary | ICD-10-CM | POA: Diagnosis not present

## 2019-02-05 DIAGNOSIS — Z7901 Long term (current) use of anticoagulants: Secondary | ICD-10-CM | POA: Diagnosis not present

## 2019-02-05 DIAGNOSIS — Z954 Presence of other heart-valve replacement: Secondary | ICD-10-CM | POA: Diagnosis not present

## 2019-02-05 DIAGNOSIS — Z853 Personal history of malignant neoplasm of breast: Secondary | ICD-10-CM | POA: Diagnosis not present

## 2019-02-05 DIAGNOSIS — I480 Paroxysmal atrial fibrillation: Secondary | ICD-10-CM | POA: Diagnosis not present

## 2019-02-06 ENCOUNTER — Ambulatory Visit (INDEPENDENT_AMBULATORY_CARE_PROVIDER_SITE_OTHER): Payer: BLUE CROSS/BLUE SHIELD | Admitting: Family Medicine

## 2019-02-06 ENCOUNTER — Telehealth: Payer: Self-pay | Admitting: Internal Medicine

## 2019-02-06 VITALS — BP 133/70 | HR 77 | Temp 98.7°F | Wt 218.1 lb

## 2019-02-06 DIAGNOSIS — R05 Cough: Secondary | ICD-10-CM | POA: Diagnosis not present

## 2019-02-06 DIAGNOSIS — R5081 Fever presenting with conditions classified elsewhere: Secondary | ICD-10-CM | POA: Diagnosis not present

## 2019-02-06 DIAGNOSIS — R110 Nausea: Secondary | ICD-10-CM

## 2019-02-06 DIAGNOSIS — R059 Cough, unspecified: Secondary | ICD-10-CM

## 2019-02-06 DIAGNOSIS — J189 Pneumonia, unspecified organism: Secondary | ICD-10-CM | POA: Diagnosis not present

## 2019-02-06 MED ORDER — PREDNISONE 1 MG PO TABS: ORAL_TABLET | ORAL | 0 refills | Status: DC

## 2019-02-06 MED ORDER — FLOVENT HFA 44 MCG/ACT IN AERO: 2.0000 | INHALATION_SPRAY | Freq: Two times a day (BID) | RESPIRATORY_TRACT | 12 refills | Status: DC

## 2019-02-06 MED ORDER — BENZONATATE 100 MG PO CAPS: 100.0000 mg | ORAL_CAPSULE | Freq: Three times a day (TID) | ORAL | 0 refills | Status: DC | PRN

## 2019-02-06 MED ORDER — ONDANSETRON 8 MG PO TBDP: 8.0000 mg | ORAL_TABLET | Freq: Three times a day (TID) | ORAL | 0 refills | Status: DC | PRN

## 2019-02-06 MED ORDER — HYDROCOD POLST-CPM POLST ER 10-8 MG/5ML PO SUER: 5.0000 mL | Freq: Two times a day (BID) | ORAL | 0 refills | Status: DC | PRN

## 2019-02-06 NOTE — Patient Instructions (Addendum)
Tomorrow take 1/2 tablet of 20 mg prednisone for 4 days. Then take 5 tablets of Prednisone 1 mg and them take prednisone 1 mg x 2 days. The start Fluticasone inhaler once prednisone is completed.  Cough medication sent to pharmacy. Get Chest X-ray  4:30 prior to your visit at the Respiratory Clinic 02/12/19 For x-ray imaging: [facility]  [place], [place], [place] [address] Enter through [place] and request x-ray department. You will be contacted either via phone or via Index with your x-ray result.  Community-Acquired Pneumonia, Adult Pneumonia is an infection of the lungs. It causes swelling in the airways of the lungs. Mucus and fluid may also build up inside the airways. One type of pneumonia can happen while a person is in a hospital. A different type can happen when a person is not in a hospital (community-acquired pneumonia).  What are the causes?  This condition is caused by germs (viruses, bacteria, or fungi). Some types of germs can be passed from one person to another. This can happen when you breathe in droplets from the cough or sneeze of an infected person. What increases the risk? You are more likely to develop this condition if you:  Have a long-term (chronic) disease, such as: ? Chronic obstructive pulmonary disease (COPD). ? Asthma. ? Cystic fibrosis. ? Congestive heart failure. ? Diabetes. ? Kidney disease.  Have HIV.  Have sickle cell disease.  Have had your spleen removed.  Do not take good care of your teeth and mouth (poor dental hygiene).  Have a medical condition that increases the risk of breathing in droplets from your own mouth and nose.  Have a weakened body defense system (immune system).  Are a smoker.  Travel to areas where the germs that cause this illness are common.  Are around certain animals or the places they live. What are the signs or symptoms?  A dry cough.  A wet (productive)  cough.  Fever.  Sweating.  Chest pain. This often happens when breathing deeply or coughing.  Fast breathing or trouble breathing.  Shortness of breath.  Shaking chills.  Feeling tired (fatigue).  Muscle aches. How is this treated? Treatment for this condition depends on many things. Most adults can be treated at home. In some cases, treatment must happen in a hospital. Treatment may include:  Medicines given by mouth or through an IV tube.  Being given extra oxygen.  Respiratory therapy. In rare cases, treatment for very bad pneumonia may include:  Using a machine to help you breathe.  Having a procedure to remove fluid from around your lungs. Follow these instructions at home: Medicines  Take over-the-counter and prescription medicines only as told by your doctor. ? Only take cough medicine if you are losing sleep.  If you were prescribed an antibiotic medicine, take it as told by your doctor. Do not stop taking the antibiotic even if you start to feel better. General instructions   Sleep with your head and neck raised (elevated). You can do this by sleeping in a recliner or by putting a few pillows under your head.  Rest as needed. Get at least 8 hours of sleep each night.  Drink enough water to keep your pee (urine) pale yellow.  Eat a healthy diet that includes plenty of vegetables, fruits, whole grains, low-fat dairy products, and lean protein.  Do not use any products that contain nicotine or tobacco. These include cigarettes, e-cigarettes, and chewing tobacco. If you need  help quitting, ask your doctor.  Keep all follow-up visits as told by your doctor. This is important. How is this prevented? A shot (vaccine) can help prevent pneumonia. Shots are often suggested for:  People older than 65 years of age.  People older than 65 years of age who: ? Are having cancer treatment. ? Have long-term (chronic) lung disease. ? Have problems with their body's  defense system. You may also prevent pneumonia if you take these actions:  Get the flu (influenza) shot every year.  Go to the dentist as often as told.  Wash your hands often. If you cannot use soap and water, use hand sanitizer. Contact a doctor if:  You have a fever.  You lose sleep because your cough medicine does not help. Get help right away if:  You are short of breath and it gets worse.  You have more chest pain.  Your sickness gets worse. This is very serious if: ? You are an older adult. ? Your body's defense system is weak.  You cough up blood. Summary  Pneumonia is an infection of the lungs.  Most adults can be treated at home. Some will need treatment in a hospital.  Drink enough water to keep your pee pale yellow.  Get at least 8 hours of sleep each night. This information is not intended to replace advice given to you by your health care provider. Make sure you discuss any questions you have with your health care provider. Document Revised: 04/10/2018 Document Reviewed: 08/16/2017 Elsevier Patient Education  [place].

## 2019-02-06 NOTE — Telephone Encounter (Signed)
     Spouse calling to report patient has been diagnosed by their local urgent care with bilateral pneumonia. Patient is still complaining of fatigue, fever, dizziness. Appointment has been scheduled for [facility] for 02/04. Spouse wanted to make Dr [name] aware.

## 2019-02-06 NOTE — Telephone Encounter (Signed)
Please see below message.

## 2019-02-06 NOTE — Progress Notes (Signed)
Patient ID: [name], female    DOB: 08-14-1955, 64 y.o.   MRN: [id number]  PCP: Biagio Borg, MD  No chief complaint on file.   Subjective:  HPI  [name] is a 64 y.o. female presents to [facility] Respiratory clinic for evaluation of Pneumonia which has persistent since 01/10/19.  Patient seen at [place] on 01/10/19 diagnosed with pneumonia (unilateral) and treatment initiated with prednisone and antibiotics. Rapid COVID-19 negative. Symptoms worsened. She returned to urgent care for subsequent chest x-ray which indicated worsening pneumonia. She was at some point treated with Levaquin. Patient has a history of Levaquin failure with prior respiratory infections. The provider at [place] then initiated treatment with Doxycyline and continue oral prednisone. She had telemedicine visit with her PCP today and was recommended to have an in-person evaluation of patient in person.   Current symptoms include shortness of breath , persistent cough, and intermittent fevers persist. Of note patient is still taking daily high dose prednisone and has not been weaned from prednisone since 01/10/19. She is using an albuterol inhaler as needed for SOB symptoms with temporary relief. Last measurable fever today >100.5. Taking around the clock antipyretic for management of fever. To date no positive COVID-19 test, however, given degree of symptoms, high suspicion for COVID-19 infection related to persistent PNA.  Review of Systems Pertinent negatives listed in HPI  Patient Active Problem List   Diagnosis Date Noted  . Acute ear pain, bilateral 08/29/2018  . Colon cancer screening 08/29/2018  . Subdural hematoma ([place]) 07/04/2018  . Episodic altered awareness 06/05/2018  . 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 Dysfunction of Eustachian tube, bilateral 11/24/2017  . Diarrhea 08/14/2017  . Fever 08/14/2017  . Whole body pain 08/14/2017  . Migraine equivalent syndrome 05/31/2017  . Ocular motility disturbance  05/31/2017  . Bilateral otitis media with effusion 05/29/2017  . Cough 02/22/2017  . Wheezing 02/22/2017  . Nosebleed 08/29/2016  . Headache 08/29/2016  . Chronic anticoagulation 05/16/2016  . S/P mitral valve replacement 05/16/2016  . Dysuria 05/21/2015  . Other hypertrophic cardiomyopathy ([place]) 03/11/2015  . Syncope 08/26/2014  . Encounter for well adult exam with abnormal findings 07/08/2014  . Change in vision 01/21/2014  . Ear lesion 08/24/2013  . Right ear pain 04/10/2013  . Left otitis media 12/25/2012  . Chest pain on exertion 10/02/2012  . Dyspnea on exertion 06/06/2012  . Other fatigue 04/05/2012  . Dizzy 02/07/2012  . Fatigue 06/01/2010  . Chronic sinus infection 04/12/2010  . Dengue 02/22/2010  . MASTECTOMY, BILATERAL, HX OF 08/12/2009  . Dysfunction of eustachian tube 05/26/2009  . ADD 01/20/2009  . Hypothyroidism 03/06/2008  . VITAMIN D DEFICIENCY 03/06/2008  . RASH-NONVESICULAR 02/13/2008  . HYPERLIPIDEMIA 12/12/2007  . Hematemesis 10/25/2007  . IBS 04/04/2007  . RECTAL BLEEDING 04/04/2007  . GI BLEEDING 04/04/2007  . SHY-DRAGER SYNDROME 01/17/2007  . VERTIGO 01/17/2007  . 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 NEOP, MALIGNANT, FEMALE BREAST NOS 10/15/2006  . Anxiety state 10/15/2006  . Depression 10/15/2006  . Essential hypertension 10/15/2006  . Allergic rhinitis 10/15/2006  . CYST, OVARIAN NEC/NOS 10/15/2006  . DYSAUTONOMIA 10/15/2006  . DVT, HX OF 10/15/2006  . COLONIC POLYPS, HX OF 10/15/2006  . Personal History of Other Diseases of Digestive Disease 10/15/2006  . COLECTOMY, PARTIAL, WITH ANASTOMOSIS, HX OF 10/15/2006      Prior to Admission medications   Medication Sig Start Date End Date Taking? Authorizing Provider  acetaminophen-codeine (TYLENOL #3) 300-30 MG tablet  TAKE 1 TABLET BY MOUTH FOUR TIMES DAILY AS NEEDED 01/22/19   Biagio Borg, MD  Black Elderberry (SAMBUCUS ELDERBERRY PO) Take 1 tablet by mouth daily. Dissolvable  ELDERBERRY/VITAMIN C/ZINC fizzy tablet    [provider]  cetirizine (ZYRTEC) 10 MG tablet Take 1 tablet (10 mg total) by mouth daily. Patient taking differently: Take 10 mg by mouth daily as needed for allergies or rhinitis.  11/23/17 11/23/18  Biagio Borg, MD  fluticasone (FLONASE) 50 MCG/ACT nasal spray SHAKE LIQUID AND USE 2 SPRAYS IN [facility] NOSTRIL DAILY Patient not taking: No sig reported 01/05/17   Biagio Borg, MD  levothyroxine (SYNTHROID) 50 MCG tablet TAKE ONE TABLET EVERY DAY 11/22/18   Biagio Borg, MD  losartan (COZAAR) 25 MG tablet Take by mouth. 08/23/17   [provider]  metoprolol succinate (TOPROL-XL) 50 MG 24 hr tablet Take 1 tablet (50 mg total) by mouth daily. Take 1 by mouth daily Patient taking differently: Take 50 mg by mouth daily.  08/06/15   Biagio Borg, MD  venlafaxine XR (EFFEXOR-XR) 150 MG 24 hr capsule Take 1 capsule (150 mg total) by mouth daily. 12/23/18   Biagio Borg, MD  vitamin C (ASCORBIC ACID) 500 MG tablet Take 500 mg by mouth daily.     [provider]  warfarin (COUMADIN) 10 MG tablet Take 1 tablet daily or as directed by anticoagulation clinic Patient taking differently: Take 10 mg by mouth at bedtime.  05/31/18   Biagio Borg, MD  zolpidem (AMBIEN) 10 MG tablet TAKE 1 TABLET BY MOUTH EVERY DAY AT BEDTIME AS NEEDED Patient taking differently: Take 10 mg by mouth at bedtime as needed for sleep.  01/18/15   Biagio Borg, MD  zonisamide (ZONEGRAN) 100 MG capsule Take 2 capsules (200 mg total) by mouth at bedtime. 03/05/18   Dennie Bible, NP    Past Medical, Surgical Family and Social History reviewed and updated.    Objective:   Today's Vitals   02/06/19 1815  BP: 133/70  Pulse: 77  Temp: 98.7 F (37.1 C)  SpO2: 96%  Weight: 218 lb 1.3 oz (98.9 kg)    Wt Readings from Last 3 Encounters:  02/06/19 218 lb 1.3 oz (98.9 kg)  07/05/18 218 lb 4.1 oz (99 kg)  09/06/17 215 lb 8 oz (97.8 kg)   Physical  Exam      Assessment & Plan:  1. Pneumonia of both lungs due to infectious organism, unspecified part of lung - Comprehensive metabolic panel, check renal function and electrolyte function  - CBC with Differential/Platelet, evaluate WBC and Hemoglobin  - DG Chest 2 View; Future, obtain prior to follow-up visit 02/12/19 - predniSONE (DELTASONE) 1 MG tablet; Day 5 start 5 tablets PO, then 1 tablet PO x 2 days.  Dispense: 7 tablet; Refill: 0 (wean off prednisone ) - fluticasone (FLOVENT HFA) 44 MCG/ACT inhaler; Inhale 2 puffs into the lungs 2 (two) times daily.  Dispense: 1 Inhaler; Refill: 12 (start once prednisone completed)  2. Cough, secondary to PNA - CBC with Differential/Platelet - DG Chest 2 View; Future - benzonatate (TESSALON) 100 MG capsule; Take 1-2 capsules (100-200 mg total) by mouth 3 (three) times daily as needed for cough.  Dispense: 60 capsule; Refill: 0 - chlorpheniramine-HYDROcodone (TUSSIONEX PENNKINETIC ER) 10-8 MG/5ML SUER; Take 5 mLs by mouth every 12 (twelve) hours as needed for cough.  Dispense: 120 mL; Refill: 0  3. Fever in other diseases - CBC with  Differential/Platelet, if WBC and neutrophils elevated, will consider extending antibiotics therapy. Suspect elevation in WBC will be present given the length of time patient has been prescribed high dose prednisone. Will review results once available. For now continue antipyretic therapy.  4. Nausea - ondansetron (ZOFRAN-ODT) 8 MG disintegrating tablet; Take 1 tablet (8 mg total) by mouth every 8 (eight) hours as needed for nausea.  Dispense: 30 tablet; Refill: 0    RTC: 02/12/19 PNA follow-up. Repeat CBC   -The patient was given clear instructions to go to ER or return to medical center if symptoms do not improve, worsen or new problems develop. The patient verbalized understanding.     Molli Barrows, FNP-C [facility], PRN Provider  [facility] [place], [place] [facility] Phone:  [phone] Clinic Fax: [phone] Clinic Hours: 5:30 pm -7:30 pm (Monday-Friday)

## 2019-02-07 ENCOUNTER — Ambulatory Visit: Payer: Medicare Other

## 2019-02-07 LAB — CBC WITH DIFFERENTIAL/PLATELET
Basophils Absolute: 0 10*3/uL (ref 0.0–0.2)
Basos: 0 %
EOS (ABSOLUTE): 0 10*3/uL (ref 0.0–0.4)
Eos: 0 %
Hematocrit: 44.6 % (ref 34.0–46.6)
Hemoglobin: 14.9 g/dL (ref 11.1–15.9)
Immature Grans (Abs): 0.1 10*3/uL (ref 0.0–0.1)
Immature Granulocytes: 1 %
Lymphocytes Absolute: 1.7 10*3/uL (ref 0.7–3.1)
Lymphs: 12 %
MCH: 28.9 pg (ref 26.6–33.0)
MCHC: 33.4 g/dL (ref 31.5–35.7)
MCV: 87 fL (ref 79–97)
Monocytes Absolute: 0.9 10*3/uL (ref 0.1–0.9)
Monocytes: 6 %
Neutrophils Absolute: 10.7 10*3/uL — ABNORMAL HIGH (ref 1.4–7.0)
Neutrophils: 81 %
Platelets: 336 10*3/uL (ref 150–450)
RBC: 5.15 x10E6/uL (ref 3.77–5.28)
RDW: 14.7 % (ref 11.7–15.4)
WBC: 13.4 10*3/uL — ABNORMAL HIGH (ref 3.4–10.8)

## 2019-02-07 LAB — COMPREHENSIVE METABOLIC PANEL
ALT: 19 IU/L (ref 0–32)
AST: 17 IU/L (ref 0–40)
Albumin/Globulin Ratio: 2 (ref 1.2–2.2)
Albumin: 4.5 g/dL (ref 3.8–4.8)
Alkaline Phosphatase: 127 IU/L — ABNORMAL HIGH (ref 39–117)
BUN/Creatinine Ratio: 23 (ref 12–28)
BUN: 23 mg/dL (ref 8–27)
Bilirubin Total: 0.3 mg/dL (ref 0.0–1.2)
CO2: 23 mmol/L (ref 20–29)
Calcium: 8.9 mg/dL (ref 8.7–10.3)
Chloride: 101 mmol/L (ref 96–106)
Creatinine, Ser: 1.01 mg/dL — ABNORMAL HIGH (ref 0.57–1.00)
GFR calc Af Amer: 68 mL/min/{1.73_m2} (ref >59)
GFR calc non Af Amer: 59 mL/min/{1.73_m2} — ABNORMAL LOW (ref >59)
Globulin, Total: 2.2 g/dL (ref 1.5–4.5)
Glucose: 152 mg/dL — ABNORMAL HIGH (ref 65–99)
Potassium: 4.2 mmol/L (ref 3.5–5.2)
Sodium: 137 mmol/L (ref 134–144)
Total Protein: 6.7 g/dL (ref 6.0–8.5)

## 2019-02-08 MED ORDER — DOXYCYCLINE HYCLATE 100 MG PO CAPS: 100.0000 mg | ORAL_CAPSULE | Freq: Two times a day (BID) | ORAL | 0 refills | Status: AC

## 2019-02-08 MED ORDER — DOXYCYCLINE HYCLATE 100 MG PO CAPS: 100.0000 mg | ORAL_CAPSULE | Freq: Three times a day (TID) | ORAL | 0 refills | Status: DC

## 2019-02-08 MED ORDER — AZITHROMYCIN 250 MG PO TABS: ORAL_TABLET | ORAL | 0 refills | Status: DC

## 2019-02-10 ENCOUNTER — Telehealth: Payer: Self-pay | Admitting: *Deleted

## 2019-02-10 NOTE — Telephone Encounter (Signed)
Left patient a VM regarding patient's labs from Respiratory clinic. Asked to call back to verify patient received message.

## 2019-02-11 ENCOUNTER — Encounter: Payer: Self-pay | Admitting: Family Medicine

## 2019-02-12 ENCOUNTER — Ambulatory Visit: Payer: Medicare Other

## 2019-02-14 ENCOUNTER — Ambulatory Visit (INDEPENDENT_AMBULATORY_CARE_PROVIDER_SITE_OTHER): Payer: BLUE CROSS/BLUE SHIELD

## 2019-02-14 ENCOUNTER — Ambulatory Visit (INDEPENDENT_AMBULATORY_CARE_PROVIDER_SITE_OTHER): Payer: BLUE CROSS/BLUE SHIELD | Admitting: Family Medicine

## 2019-02-14 VITALS — BP 124/76 | HR 66 | Temp 98.8°F | Resp 16

## 2019-02-14 DIAGNOSIS — I480 Paroxysmal atrial fibrillation: Secondary | ICD-10-CM | POA: Diagnosis not present

## 2019-02-14 DIAGNOSIS — R059 Cough, unspecified: Secondary | ICD-10-CM

## 2019-02-14 DIAGNOSIS — Z954 Presence of other heart-valve replacement: Secondary | ICD-10-CM | POA: Diagnosis not present

## 2019-02-14 DIAGNOSIS — R05 Cough: Secondary | ICD-10-CM | POA: Diagnosis not present

## 2019-02-14 DIAGNOSIS — J189 Pneumonia, unspecified organism: Secondary | ICD-10-CM

## 2019-02-14 DIAGNOSIS — Z7901 Long term (current) use of anticoagulants: Secondary | ICD-10-CM | POA: Diagnosis not present

## 2019-02-14 DIAGNOSIS — R0989 Other specified symptoms and signs involving the circulatory and respiratory systems: Secondary | ICD-10-CM | POA: Diagnosis not present

## 2019-02-14 MED ORDER — DOXYCYCLINE HYCLATE 100 MG PO CAPS: 100.0000 mg | ORAL_CAPSULE | Freq: Two times a day (BID) | ORAL | 0 refills | Status: AC

## 2019-02-14 NOTE — Progress Notes (Signed)
Patient ID: [name], female    DOB: 1955-06-22, 64 y.o.   MRN: [id number]  PCP: Biagio Borg, MD  No chief complaint on file.   Subjective:  HPI  [name] is a 64 y.o. female presents to [facility] Res[facility] clinic for evaluation of persistent pneumonia related symptoms from initial diagnosis 01/10/19.   Prior Visit 02/06/19 Patient seen at [facility] on 01/10/19 diagnosed with pneumonia (unilateral) and treatment initiated with prednisone and antibiotics. Rapid COVID-19 negative. Symptoms worsened. She returned to urgent care for subsequent chest x-ray which indicated worsening pneumonia. She was at some point treated with Levaquin. Patient has a history of Levaquin failure with prior respiratory infections. The provider at [place] then initiated treatment with Doxycyline and continue oral prednisone. She had telemedicine visit with her PCP today and was recommended to have an in-person evaluation of patient in person.   Current symptoms include shortness of breath , persistent cough, and intermittent fevers persist. Of note patient is still taking daily high dose prednisone and has not been weaned from prednisone since 01/10/19. She is using an albuterol inhaler as needed for SOB symptoms with temporary relief. Last measurable fever today >100.5. Taking around the clock antipyretic for management of fever. To date no positive COVID-19 test, however, given degree of symptoms, high suspicion for COVID-19 infection related to persistent PNA.   Visit Today  02/15/19 [name] today for follow-up. During her last visit a CBC was completed which revealed an elevated WBC and elevated neutrophil count.  Patient was placed on a Z-Pak in addition to her current doxycycline was extended.  She reports some improvement although persistency of cough, night sweats and she continues to have some febrile episodes at home. She continues to have some chest tightness with deep inhalation.  She has achieved some relief  with albuterol inhaler use.  She successfully weaned off of prednisone a few days ago and did not notice any significant worsening of her condition with the completion of the prednisone.  Is afebrile at present.  Appetite is about the same.  Patient previously tested for COVID-19 and was negative in January.  She will retest for COVID-19 today given recurrence of fever and dry nonproductive cough.  Review of Systems Pertinent negatives listed in HPI  Patient Active Problem List   Diagnosis Date Noted  . Acute ear pain, bilateral 08/29/2018  . Colon cancer screening 08/29/2018  . Subdural hematoma ([place]) 07/04/2018  . Episodic altered awareness 06/05/2018  . 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 Dysfunction of Eustachian tube, bilateral 11/24/2017  . Diarrhea 08/14/2017  . Fever 08/14/2017  . Whole body pain 08/14/2017  . Migraine equivalent syndrome 05/31/2017  . Ocular motility disturbance 05/31/2017  . Bilateral otitis media with effusion 05/29/2017  . Cough 02/22/2017  . Wheezing 02/22/2017  . Nosebleed 08/29/2016  . Headache 08/29/2016  . Chronic anticoagulation 05/16/2016  . S/P mitral valve replacement 05/16/2016  . Dysuria 05/21/2015  . Other hypertrophic cardiomyopathy ([place]) 03/11/2015  . Syncope 08/26/2014  . Encounter for well adult exam with abnormal findings 07/08/2014  . Change in vision 01/21/2014  . Ear lesion 08/24/2013  . Right ear pain 04/10/2013  . Left otitis media 12/25/2012  . Chest pain on exertion 10/02/2012  . Dyspnea on exertion 06/06/2012  . Other fatigue 04/05/2012  . Dizzy 02/07/2012  . Fatigue 06/01/2010  . Chronic sinus infection 04/12/2010  . Dengue 02/22/2010  . MASTECTOMY, BILATERAL, HX OF 08/12/2009  . Dysfunction of eustachian tube  05/26/2009  . ADD 01/20/2009  . Hypothyroidism 03/06/2008  . VITAMIN D DEFICIENCY 03/06/2008  . RASH-NONVESICULAR 02/13/2008  . HYPERLIPIDEMIA 12/12/2007  . Hematemesis 10/25/2007  . IBS 04/04/2007  . RECTAL  BLEEDING 04/04/2007  . GI BLEEDING 04/04/2007  . SHY-DRAGER SYNDROME 01/17/2007  . VERTIGO 01/17/2007  . 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 NEOP, MALIGNANT, FEMALE BREAST NOS 10/15/2006  . Anxiety state 10/15/2006  . Depression 10/15/2006  . Essential hypertension 10/15/2006  . Allergic rhinitis 10/15/2006  . CYST, OVARIAN NEC/NOS 10/15/2006  . DYSAUTONOMIA 10/15/2006  . DVT, HX OF 10/15/2006  . COLONIC POLYPS, HX OF 10/15/2006  . Personal History of Other Diseases of Digestive Disease 10/15/2006  . COLECTOMY, PARTIAL, WITH ANASTOMOSIS, HX OF 10/15/2006      Prior to Admission medications   Medication Sig Start Date End Date Taking? Authorizing Provider  acetaminophen-codeine (TYLENOL #3) 300-30 MG tablet TAKE 1 TABLET BY MOUTH FOUR TIMES DAILY AS NEEDED 01/22/19   Biagio Borg, MD  Black Elderberry (SAMBUCUS ELDERBERRY PO) Take 1 tablet by mouth daily. Dissolvable ELDERBERRY/VITAMIN C/ZINC fizzy tablet    [provider]  cetirizine (ZYRTEC) 10 MG tablet Take 1 tablet (10 mg total) by mouth daily. Patient taking differently: Take 10 mg by mouth daily as needed for allergies or rhinitis.  11/23/17 11/23/18  Biagio Borg, MD  fluticasone (FLONASE) 50 MCG/ACT nasal spray SHAKE LIQUID AND USE 2 SPRAYS IN [facility] NOSTRIL DAILY Patient not taking: No sig reported 01/05/17   Biagio Borg, MD  levothyroxine (SYNTHROID) 50 MCG tablet TAKE ONE TABLET EVERY DAY 11/22/18   Biagio Borg, MD  losartan (COZAAR) 25 MG tablet Take by mouth. 08/23/17   [provider]  metoprolol succinate (TOPROL-XL) 50 MG 24 hr tablet Take 1 tablet (50 mg total) by mouth daily. Take 1 by mouth daily Patient taking differently: Take 50 mg by mouth daily.  08/06/15   Biagio Borg, MD  venlafaxine XR (EFFEXOR-XR) 150 MG 24 hr capsule Take 1 capsule (150 mg total) by mouth daily. 12/23/18   Biagio Borg, MD  vitamin C (ASCORBIC ACID) 500 MG tablet Take 500 mg by mouth daily.     [provider]  warfarin (COUMADIN) 10 MG tablet Take 1 tablet daily or as directed by anticoagulation clinic Patient taking differently: Take 10 mg by mouth at bedtime.  05/31/18   Biagio Borg, MD  zolpidem (AMBIEN) 10 MG tablet TAKE 1 TABLET BY MOUTH EVERY DAY AT BEDTIME AS NEEDED Patient taking differently: Take 10 mg by mouth at bedtime as needed for sleep.  01/18/15   Biagio Borg, MD  zonisamide (ZONEGRAN) 100 MG capsule Take 2 capsules (200 mg total) by mouth at bedtime. 03/05/18   Dennie Bible, NP    Past Medical, Surgical Family and Social History reviewed and updated.    Objective:   Today's Vitals   02/14/19 1824  BP: 124/76  Pulse: 66  Resp: 16  Temp: 98.8 F (37.1 C)  TempSrc: Oral  SpO2: 96%    Wt Readings from Last 3 Encounters:  02/06/19 218 lb 1.3 oz (98.9 kg)  07/05/18 218 lb 4.1 oz (99 kg)  09/06/17 215 lb 8 oz (97.8 kg)   Physical Exam Constitutional:      Appearance: Normal appearance. She is normal weight.  HENT:     Head: Normocephalic.     Nose: Nose normal.     Mouth/Throat:     Mouth: Mucous  membranes are moist.  Eyes:     Extraocular Movements: Extraocular movements intact.     Pupils: Pupils are equal, round, and reactive to light.  Cardiovascular:     Rate and Rhythm: Normal rate and regular rhythm.     Pulses: Normal pulses.  Pulmonary:     Effort: Pulmonary effort is normal.     Breath sounds: Rhonchi present.     Comments: Dry persistent cough noted throughout exam  Musculoskeletal:     Cervical back: No rigidity.  Lymphadenopathy:     Cervical: No cervical adenopathy.  Skin:    General: Skin is warm.  Neurological:     Mental Status: She is oriented to person, place, and time.  Psychiatric:        Mood and Affect: Mood normal.         Assessment & Plan:  1. Pneumonia of both lungs due to infectious organism, unspecified part of lung -Will extend doxycyline 100 mg twice daily x 7 days  - Novel Coronavirus, NAA ([facility]),  pending  - CBC with Differential/Platelet, pending , if white count remains elevated, patient will warrant further evaluation at the ER.   2. Cough -Continue Benzonatate TID PRN  -Continue Albuterol  - Novel Coronavirus, NAA ([facility]) - CBC with Differential/Platelet -Chest x-ray, pending     Meds ordered this encounter  Medications  . doxycycline (VIBRAMYCIN) 100 MG capsule    Sig: Take 1 capsule (100 mg total) by mouth 2 (two) times daily for 7 days.    Dispense:  14 capsule    Refill:  0    RTC: 02/12/19 PNA follow-up. Repeat CBC   -The patient was given clear instructions to go to ER or return to medical center if symptoms do not improve, worsen or new problems develop. The patient verbalized understanding.     Molli Barrows, FNP-C [facility], PRN Provider  [facility] [place], [place] Clinic Phone: [phone] Clinic Fax: [phone] Clinic Hours: 5:30 pm -7:30 pm (Monday-Friday)

## 2019-02-15 LAB — CBC WITH DIFFERENTIAL/PLATELET
Basophils Absolute: 0 10*3/uL (ref 0.0–0.2)
Basos: 0 %
EOS (ABSOLUTE): 0.3 10*3/uL (ref 0.0–0.4)
Eos: 4 %
Hematocrit: 42.7 % (ref 34.0–46.6)
Hemoglobin: 14.1 g/dL (ref 11.1–15.9)
Immature Grans (Abs): 0 10*3/uL (ref 0.0–0.1)
Immature Granulocytes: 0 %
Lymphocytes Absolute: 1.8 10*3/uL (ref 0.7–3.1)
Lymphs: 25 %
MCH: 28.7 pg (ref 26.6–33.0)
MCHC: 33 g/dL (ref 31.5–35.7)
MCV: 87 fL (ref 79–97)
Monocytes Absolute: 0.6 10*3/uL (ref 0.1–0.9)
Monocytes: 8 %
Neutrophils Absolute: 4.6 10*3/uL (ref 1.4–7.0)
Neutrophils: 63 %
Platelets: 246 10*3/uL (ref 150–450)
RBC: 4.91 x10E6/uL (ref 3.77–5.28)
RDW: 14.5 % (ref 11.7–15.4)
WBC: 7.4 10*3/uL (ref 3.4–10.8)

## 2019-02-15 MED ORDER — FUROSEMIDE 40 MG PO TABS: 40.0000 mg | ORAL_TABLET | Freq: Every day | ORAL | 0 refills | Status: DC

## 2019-02-16 LAB — NOVEL CORONAVIRUS, NAA: SARS-CoV-2, NAA: NOT DETECTED

## 2019-02-20 ENCOUNTER — Encounter: Payer: Self-pay | Admitting: Family Medicine

## 2019-02-21 ENCOUNTER — Telehealth: Payer: Self-pay

## 2019-02-21 ENCOUNTER — Encounter: Payer: Self-pay | Admitting: Family Medicine

## 2019-02-21 DIAGNOSIS — K580 Irritable bowel syndrome with diarrhea: Secondary | ICD-10-CM

## 2019-02-21 MED ORDER — ACETAMINOPHEN-CODEINE #3 300-30 MG PO TABS: ORAL_TABLET | ORAL | 0 refills | Status: DC

## 2019-02-21 NOTE — Telephone Encounter (Signed)
Medication Requested: acetaminophen-codeine (TYLENOL #3) 300-30 MG tablet  Is medication on med list:  Yes   (if no, inform pt they may need an appointment)  Is medication a controled: Yes  (yes = last OV with PCP)  -Controlled Substances: Adderall, Ritalin, oxycodone, hydrocodone, methadone, alprazolam, etc  Last visit with PCP: 01/09/2019   Is the OV > than 4 months: (yes = schedule an appt if one is not already made)  Pharmacy (Name, [place]): [facility] on [address] [phone]

## 2019-02-21 NOTE — Telephone Encounter (Signed)
Done erx 

## 2019-02-25 DIAGNOSIS — I480 Paroxysmal atrial fibrillation: Secondary | ICD-10-CM | POA: Diagnosis not present

## 2019-02-25 DIAGNOSIS — Z954 Presence of other heart-valve replacement: Secondary | ICD-10-CM | POA: Diagnosis not present

## 2019-02-25 DIAGNOSIS — Z7901 Long term (current) use of anticoagulants: Secondary | ICD-10-CM | POA: Diagnosis not present

## 2019-02-27 DIAGNOSIS — I422 Other hypertrophic cardiomyopathy: Secondary | ICD-10-CM | POA: Diagnosis not present

## 2019-02-27 DIAGNOSIS — Z954 Presence of other heart-valve replacement: Secondary | ICD-10-CM | POA: Diagnosis not present

## 2019-02-27 DIAGNOSIS — I480 Paroxysmal atrial fibrillation: Secondary | ICD-10-CM | POA: Diagnosis not present

## 2019-03-05 DIAGNOSIS — N179 Acute kidney failure, unspecified: Secondary | ICD-10-CM | POA: Diagnosis not present

## 2019-03-05 DIAGNOSIS — I422 Other hypertrophic cardiomyopathy: Secondary | ICD-10-CM | POA: Diagnosis not present

## 2019-03-05 DIAGNOSIS — I480 Paroxysmal atrial fibrillation: Secondary | ICD-10-CM | POA: Diagnosis not present

## 2019-03-05 DIAGNOSIS — Z7901 Long term (current) use of anticoagulants: Secondary | ICD-10-CM | POA: Diagnosis not present

## 2019-03-08 ENCOUNTER — Other Ambulatory Visit: Payer: Self-pay | Admitting: Internal Medicine

## 2019-03-08 NOTE — Telephone Encounter (Signed)
Please refill as per office routine med refill policy (all routine meds refilled for 3 mo or monthly per pt preference up to one year from last visit, then month to month grace period for 3 mo, then further med refills will have to be denied)  

## 2019-03-17 DIAGNOSIS — I480 Paroxysmal atrial fibrillation: Secondary | ICD-10-CM | POA: Diagnosis not present

## 2019-03-17 DIAGNOSIS — Z7901 Long term (current) use of anticoagulants: Secondary | ICD-10-CM | POA: Diagnosis not present

## 2019-03-17 DIAGNOSIS — Z954 Presence of other heart-valve replacement: Secondary | ICD-10-CM | POA: Diagnosis not present

## 2019-03-22 ENCOUNTER — Other Ambulatory Visit: Payer: Self-pay | Admitting: Internal Medicine

## 2019-03-22 DIAGNOSIS — K580 Irritable bowel syndrome with diarrhea: Secondary | ICD-10-CM

## 2019-03-23 NOTE — Telephone Encounter (Signed)
Done erx 

## 2019-03-27 DIAGNOSIS — I480 Paroxysmal atrial fibrillation: Secondary | ICD-10-CM | POA: Diagnosis not present

## 2019-03-27 DIAGNOSIS — Z954 Presence of other heart-valve replacement: Secondary | ICD-10-CM | POA: Diagnosis not present

## 2019-03-31 DIAGNOSIS — R531 Weakness: Secondary | ICD-10-CM | POA: Diagnosis not present

## 2019-03-31 DIAGNOSIS — Z8669 Personal history of other diseases of the nervous system and sense organs: Secondary | ICD-10-CM | POA: Diagnosis not present

## 2019-03-31 DIAGNOSIS — I480 Paroxysmal atrial fibrillation: Secondary | ICD-10-CM | POA: Diagnosis not present

## 2019-03-31 DIAGNOSIS — Z954 Presence of other heart-valve replacement: Secondary | ICD-10-CM | POA: Diagnosis not present

## 2019-03-31 DIAGNOSIS — G9389 Other specified disorders of brain: Secondary | ICD-10-CM | POA: Diagnosis not present

## 2019-03-31 DIAGNOSIS — I10 Essential (primary) hypertension: Secondary | ICD-10-CM | POA: Diagnosis not present

## 2019-03-31 DIAGNOSIS — R29898 Other symptoms and signs involving the musculoskeletal system: Secondary | ICD-10-CM | POA: Diagnosis not present

## 2019-03-31 DIAGNOSIS — R200 Anesthesia of skin: Secondary | ICD-10-CM | POA: Diagnosis not present

## 2019-03-31 DIAGNOSIS — Z7901 Long term (current) use of anticoagulants: Secondary | ICD-10-CM | POA: Diagnosis not present

## 2019-03-31 DIAGNOSIS — Z79899 Other long term (current) drug therapy: Secondary | ICD-10-CM | POA: Diagnosis not present

## 2019-04-09 DIAGNOSIS — Z7901 Long term (current) use of anticoagulants: Secondary | ICD-10-CM | POA: Diagnosis not present

## 2019-04-09 DIAGNOSIS — Z954 Presence of other heart-valve replacement: Secondary | ICD-10-CM | POA: Diagnosis not present

## 2019-04-22 ENCOUNTER — Other Ambulatory Visit: Payer: Self-pay | Admitting: Internal Medicine

## 2019-04-22 DIAGNOSIS — Z45018 Encounter for adjustment and management of other part of cardiac pacemaker: Secondary | ICD-10-CM | POA: Diagnosis not present

## 2019-04-22 DIAGNOSIS — Z950 Presence of cardiac pacemaker: Secondary | ICD-10-CM | POA: Diagnosis not present

## 2019-04-22 DIAGNOSIS — K580 Irritable bowel syndrome with diarrhea: Secondary | ICD-10-CM

## 2019-04-22 DIAGNOSIS — R4689 Other symptoms and signs involving appearance and behavior: Secondary | ICD-10-CM | POA: Diagnosis not present

## 2019-04-22 DIAGNOSIS — I472 Ventricular tachycardia: Secondary | ICD-10-CM | POA: Diagnosis not present

## 2019-04-22 DIAGNOSIS — R519 Headache, unspecified: Secondary | ICD-10-CM | POA: Diagnosis not present

## 2019-04-22 DIAGNOSIS — R29818 Other symptoms and signs involving the nervous system: Secondary | ICD-10-CM | POA: Diagnosis not present

## 2019-04-22 DIAGNOSIS — G44099 Other trigeminal autonomic cephalgias (TAC), not intractable: Secondary | ICD-10-CM | POA: Diagnosis not present

## 2019-04-22 NOTE — Telephone Encounter (Signed)
Done erx 

## 2019-04-24 DIAGNOSIS — Z954 Presence of other heart-valve replacement: Secondary | ICD-10-CM | POA: Diagnosis not present

## 2019-04-24 DIAGNOSIS — I480 Paroxysmal atrial fibrillation: Secondary | ICD-10-CM | POA: Diagnosis not present

## 2019-04-28 DIAGNOSIS — Z7901 Long term (current) use of anticoagulants: Secondary | ICD-10-CM | POA: Diagnosis not present

## 2019-04-28 DIAGNOSIS — Z954 Presence of other heart-valve replacement: Secondary | ICD-10-CM | POA: Diagnosis not present

## 2019-04-28 DIAGNOSIS — I422 Other hypertrophic cardiomyopathy: Secondary | ICD-10-CM | POA: Diagnosis not present

## 2019-04-28 DIAGNOSIS — I480 Paroxysmal atrial fibrillation: Secondary | ICD-10-CM | POA: Diagnosis not present

## 2019-05-08 ENCOUNTER — Encounter: Payer: Self-pay | Admitting: Internal Medicine

## 2019-05-08 ENCOUNTER — Telehealth (INDEPENDENT_AMBULATORY_CARE_PROVIDER_SITE_OTHER): Payer: Medicare Other | Admitting: Internal Medicine

## 2019-05-08 DIAGNOSIS — H9203 Otalgia, bilateral: Secondary | ICD-10-CM | POA: Diagnosis not present

## 2019-05-08 DIAGNOSIS — F411 Generalized anxiety disorder: Secondary | ICD-10-CM | POA: Diagnosis not present

## 2019-05-08 DIAGNOSIS — J309 Allergic rhinitis, unspecified: Secondary | ICD-10-CM | POA: Diagnosis not present

## 2019-05-08 MED ORDER — CEFUROXIME AXETIL 250 MG PO TABS: 250.0000 mg | ORAL_TABLET | Freq: Two times a day (BID) | ORAL | 1 refills | Status: DC

## 2019-05-08 NOTE — Assessment & Plan Note (Addendum)
Right > Left, cant r/o infectious, Mild to mod, for antibx course,  to f/u any worsening symptoms or concerns  I spent 31 minutes in preparing to see the patient by review of recent labs, imaging and procedures, obtaining and reviewing separately obtained history, communicating with the patient and family or caregiver, ordering medications, tests or procedures, and documenting clinical information in the EHR including the differential Dx, treatment, and any further evaluation and other management of ear pain, allergies, anxiety

## 2019-05-08 NOTE — Progress Notes (Signed)
Patient ID: [name], female   DOB: 11/04/1955, 64 y.o.   MRN: [id number]  Virtual Visit via Video Note  I connected with [name] on 05/08/19 at 10:40 AM EDT by a video enabled telemedicine application and verified that I am speaking with the correct person using two identifiers.  Location: Patient: at home Provider: at office   I discussed the limitations of evaluation and management by telemedicine and the availability of in person appointments. The patient expressed understanding and agreed to proceed.  History of Present Illness:  Here with 2-3 days acute onset fever, right ear pain, pressure, headache, general weakness and malaise, and greenish d/c, with mild ST and cough, but pt denies chest pain, wheezing, increased sob or doe, orthopnea, PND, increased LE swelling, palpitations, dizziness or syncope. Nose feels raw and swollen glands under the right jaw. Last infection was 2020.  Had bilateral pna episode earlier this yr Past Medical History:  Diagnosis Date  . Abdominal pain, epigastric 10/25/2007  . ADD 01/20/2009  . ALLERGIC RHINITIS 10/15/2006  . ANGIOEDEMA 08/12/2008  . ANXIETY 10/15/2006  . ASTHMATIC BRONCHITIS, ACUTE 01/27/2008  . Cellulitis and abscess of leg, except foot 08/12/2007  . CELLULITIS, [place] 12/21/2008  . CHEST PAIN 06/14/2007  . Chronic anticoagulation 05/16/2016  . Chronic sinus infection 04/12/2010  . COLECTOMY, PARTIAL, WITH ANASTOMOSIS, HX OF 10/15/2006  . COLONIC POLYPS, HX OF 10/15/2006  . CYST, OVARIAN NEC/NOS 10/15/2006  . Dengue 02/22/2010  . DVT, HX OF 10/15/2006  . DYSAUTONOMIA 10/15/2006  . Dysfunction of eustachian tube 05/26/2009  . GI BLEEDING 04/04/2007  . Glossitis 03/06/2008  . HEADACHE, CHRONIC 04/04/2007  . Heart murmur   . Hematemesis 10/25/2007  . HEMORRHOIDS 04/04/2007  . HYPERLIPIDEMIA 12/12/2007  . HYPERTENSION 10/15/2006  . HYPOTHYROIDISM 03/06/2008  . IBS 04/04/2007  . MASTECTOMY, BILATERAL, HX OF 08/12/2009  . NECK MASS  07/01/2007  . NEOP, MALIGNANT, FEMALE BREAST NOS 10/15/2006  . 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 SYNCOPE 07/01/2007  . Wheezing 08/20/2008  . WOUND, OPEN, LEG, WITHOUT COMPLICATION [id number]   Past Surgical History:  Procedure Laterality Date  . ABDOMINAL HYSTERECTOMY    . bilateral mastectomy/reconstruction    . CESAREAN SECTION    . CHOLECYSTECTOMY    . COLONOSCOPY W/ BIOPSIES    . FLEXIBLE SIGMOIDOSCOPY    . LEFT AND RIGHT HEART CATHETERIZATION WITH CORONARY ANGIOGRAM N/A 10/07/2012   Procedure: LEFT AND RIGHT HEART CATHETERIZATION WITH CORONARY ANGIOGRAM;  Surgeon: Jolaine Artist, MD;  Location: [facility] CATH LAB;  Service: Cardiovascular;  Laterality: N/A;  . multiple ablations    . OOPHORECTOMY    . open heart surg    . spigellian hernia      reports that she has never smoked. She has never used smokeless tobacco. She reports that she does not drink alcohol or use drugs. family history includes Cancer in her sister; Heart disease in her mother; Pancreatic cancer in her father. Allergies  Allergen Reactions  . Fish Allergy Anaphylaxis and Swelling    Scaled fish, like flounder  . Penicillins Anaphylaxis and Hives    Did it involve swelling of the face/tongue/throat, SOB, or low BP? Yes Did it involve sudden or severe rash/hives, skin peeling, or any reaction on the inside of your mouth or nose? Unk Did you need to seek medical attention at a hospital or doctor's office? No When did it last happen? "I was 64 years old" If all above answers are "NO", may proceed with  cephalosporin use.   [name] [Levofloxacin In D5w] Nausea And Vomiting  . Latex Dermatitis and Rash    If worn for more than a day, this causes blisters    Current Outpatient Medications on File Prior to Visit  Medication Sig Dispense Refill  . acetaminophen-codeine (TYLENOL #3) 300-30 MG tablet TAKE 1 TABLET BY MOUTH FOUR TIMES DAILY AS NEEDED FOR PAIN 120 tablet 0  . benzonatate (TESSALON) 100 MG capsule  Take 1-2 capsules (100-200 mg total) by mouth 3 (three) times daily as needed for cough. 60 capsule 0  . Black Elderberry (SAMBUCUS ELDERBERRY PO) Take 1 tablet by mouth daily. Dissolvable ELDERBERRY/VITAMIN C/ZINC fizzy tablet    . cetirizine (ZYRTEC) 10 MG tablet Take 1 tablet (10 mg total) by mouth daily. (Patient taking differently: Take 10 mg by mouth daily as needed for allergies or rhinitis. ) 30 tablet 11  . fluticasone (FLONASE) 50 MCG/ACT nasal spray SHAKE LIQUID AND USE 2 SPRAYS IN EACH NOSTRIL DAILY 16 g 0  . fluticasone (FLOVENT HFA) 44 MCG/ACT inhaler Inhale 2 puffs into the lungs 2 (two) times daily. 1 Inhaler 12  . furosemide (LASIX) 40 MG tablet Take 1 tablet (40 mg total) by mouth daily. 5 tablet 0  . levothyroxine (SYNTHROID) 50 MCG tablet TAKE ONE TABLET EVERY DAY 90 tablet 1  . losartan (COZAAR) 25 MG tablet Take by mouth.    . metoprolol succinate (TOPROL-XL) 50 MG 24 hr tablet Take 1 tablet (50 mg total) by mouth daily. Take 1 by mouth daily (Patient taking differently: Take 50 mg by mouth daily. ) 90 tablet 1  . ondansetron (ZOFRAN-ODT) 8 MG disintegrating tablet Take 1 tablet (8 mg total) by mouth every 8 (eight) hours as needed for nausea. 30 tablet 0  . venlafaxine XR (EFFEXOR-XR) 150 MG 24 hr capsule Take 1 capsule (150 mg total) by mouth daily. 90 capsule 2  . vitamin C (ASCORBIC ACID) 500 MG tablet Take 500 mg by mouth daily.     [name] warfarin (COUMADIN) 10 MG tablet Take 1 tablet daily or as directed by anticoagulation clinic (Patient taking differently: Take 10 mg by mouth at bedtime. ) 90 tablet 0  . zolpidem (AMBIEN) 10 MG tablet TAKE 1 TABLET BY MOUTH EVERY DAY AT BEDTIME AS NEEDED (Patient taking differently: Take 10 mg by mouth at bedtime as needed for sleep. ) 90 tablet 1  . zonisamide (ZONEGRAN) 100 MG capsule Take 2 capsules (200 mg total) by mouth at bedtime. 180 capsule 1   No current facility-administered medications on file prior to visit.     Observations/Objective: Alert, NAD, appropriate mood and affect, resps normal, cn 2-12 intact, moves all 4s, no visible rash or swelling Lab Results  Component Value Date   WBC 7.4 02/14/2019   HGB 14.1 02/14/2019   HCT 42.7 02/14/2019   PLT 246 02/14/2019   GLUCOSE 152 (H) 02/06/2019   CHOL 209 (H) 12/13/2011   TRIG 195.0 (H) 12/13/2011   HDL 45.10 12/13/2011   LDLDIRECT 137.4 12/13/2011   LDLCALC 73 02/11/2008   ALT 19 02/06/2019   AST 17 02/06/2019   NA 137 02/06/2019   K 4.2 02/06/2019   CL 101 02/06/2019   CREATININE 1.01 (H) 02/06/2019   BUN 23 02/06/2019   CO2 23 02/06/2019   TSH 3.04 10/02/2012   INR 3.6 (A) 08/09/2018   HGBA1C  08/12/2007    5.2 (NOTE)   The ADA recommends the following therapeutic goal for glycemic  control related to Hgb A1C measurement:   Goal of Therapy:   < 7.0% Hgb A1C   Reference: American Diabetes Association: Clinical Practice   Recommendations 2008, Diabetes Care,  2008, 31:(Suppl 1).   Assessment and Plan: See notes  Follow Up Instructions: See notes   I discussed the assessment and treatment plan with the patient. The patient was provided an opportunity to ask questions and all were answered. The patient agreed with the plan and demonstrated an understanding of the instructions.   The patient was advised to call back or seek an in-person evaluation if the symptoms worsen or if the condition fails to improve as anticipated   Cathlean Cower, MD '

## 2019-05-08 NOTE — Assessment & Plan Note (Signed)
stable overall by history and exam, recent data reviewed with pt, and pt to continue medical treatment as before,  to f/u any worsening symptoms or concerns  

## 2019-05-08 NOTE — Patient Instructions (Signed)
Please take all new medication as prescribed 

## 2019-05-22 ENCOUNTER — Telehealth: Payer: Self-pay | Admitting: Internal Medicine

## 2019-05-22 DIAGNOSIS — Z954 Presence of other heart-valve replacement: Secondary | ICD-10-CM | POA: Diagnosis not present

## 2019-05-22 DIAGNOSIS — I480 Paroxysmal atrial fibrillation: Secondary | ICD-10-CM | POA: Diagnosis not present

## 2019-05-22 DIAGNOSIS — K580 Irritable bowel syndrome with diarrhea: Secondary | ICD-10-CM

## 2019-05-22 NOTE — Telephone Encounter (Signed)
New message:    1.Medication Requested: acetaminophen-codeine (TYLENOL #3) 300-30 MG tablet 2. Pharmacy (Name, Street, [facility]): [facility] [place], [place] ([address] 3. On Med List: Yes  4. Last Visit with PCP: 05/08/19  5. Next visit date with PCP:   Agent: Please be advised that RX refills may take up to 3 business days. We ask that you follow-up with your pharmacy.

## 2019-05-23 MED ORDER — ACETAMINOPHEN-CODEINE #3 300-30 MG PO TABS: 1.0000 | ORAL_TABLET | Freq: Four times a day (QID) | ORAL | 0 refills | Status: DC | PRN

## 2019-05-23 NOTE — Telephone Encounter (Signed)
Done erx 

## 2019-06-19 DIAGNOSIS — Z954 Presence of other heart-valve replacement: Secondary | ICD-10-CM | POA: Diagnosis not present

## 2019-06-19 DIAGNOSIS — I480 Paroxysmal atrial fibrillation: Secondary | ICD-10-CM | POA: Diagnosis not present

## 2019-06-19 DIAGNOSIS — I422 Other hypertrophic cardiomyopathy: Secondary | ICD-10-CM | POA: Diagnosis not present

## 2019-06-23 ENCOUNTER — Telehealth: Payer: Self-pay | Admitting: Internal Medicine

## 2019-06-23 DIAGNOSIS — K580 Irritable bowel syndrome with diarrhea: Secondary | ICD-10-CM

## 2019-06-23 MED ORDER — ACETAMINOPHEN-CODEINE #3 300-30 MG PO TABS: 1.0000 | ORAL_TABLET | Freq: Four times a day (QID) | ORAL | 0 refills | Status: DC | PRN

## 2019-06-23 NOTE — Telephone Encounter (Signed)
Sent to Dr. john 

## 2019-06-23 NOTE — Telephone Encounter (Signed)
   1.Medication Requested:acetaminophen-codeine (TYLENOL #3) 300-30 MG tablet  2. Pharmacy (Name, Street, City):[facility] [place], [place] ([address]  3. On Med List: yes  4. Last Visit with PCP: 05/08/19  5. Next visit date with PCP:   Agent: Please be advised that RX refills may take up to 3 business days. We ask that you follow-up with your pharmacy.

## 2019-06-23 NOTE — Telephone Encounter (Signed)
Done erx 

## 2019-07-11 ENCOUNTER — Telehealth (INDEPENDENT_AMBULATORY_CARE_PROVIDER_SITE_OTHER): Payer: Medicare Other | Admitting: Internal Medicine

## 2019-07-11 DIAGNOSIS — I10 Essential (primary) hypertension: Secondary | ICD-10-CM | POA: Diagnosis not present

## 2019-07-11 DIAGNOSIS — E039 Hypothyroidism, unspecified: Secondary | ICD-10-CM

## 2019-07-11 DIAGNOSIS — E559 Vitamin D deficiency, unspecified: Secondary | ICD-10-CM

## 2019-07-11 DIAGNOSIS — E785 Hyperlipidemia, unspecified: Secondary | ICD-10-CM | POA: Diagnosis not present

## 2019-07-11 DIAGNOSIS — E538 Deficiency of other specified B group vitamins: Secondary | ICD-10-CM

## 2019-07-11 DIAGNOSIS — H9203 Otalgia, bilateral: Secondary | ICD-10-CM

## 2019-07-11 DIAGNOSIS — F411 Generalized anxiety disorder: Secondary | ICD-10-CM

## 2019-07-11 DIAGNOSIS — R739 Hyperglycemia, unspecified: Secondary | ICD-10-CM

## 2019-07-11 MED ORDER — CEFUROXIME AXETIL 250 MG PO TABS: 250.0000 mg | ORAL_TABLET | Freq: Two times a day (BID) | ORAL | 1 refills | Status: DC

## 2019-07-11 MED ORDER — LEVOTHYROXINE SODIUM 50 MCG PO TABS: 50.0000 ug | ORAL_TABLET | Freq: Every day | ORAL | 3 refills | Status: DC

## 2019-07-11 NOTE — Patient Instructions (Signed)
Please take all new medication as prescribed - the antibiotic  Please continue all other medications as before, and refills have been done if requested.  Please have the pharmacy call with any other refills you may need.  Please continue your efforts at being more active, low cholesterol diet, and weight control.  You are otherwise up to date with prevention measures today.  Please keep your appointments with your specialists as you may have planned  Please go to the LAB at the blood drawing area for the tests to be done - next wk at the [place] will be contacted by phone if any changes need to be made immediately.  Otherwise, you will receive a letter about your results with an explanation, but please check with MyChart first.  Please remember to sign up for MyChart if you have not done so, as this will be important to you in the future with finding out test results, communicating by private email, and scheduling acute appointments online when needed.

## 2019-07-13 ENCOUNTER — Encounter: Payer: Self-pay | Admitting: Internal Medicine

## 2019-07-13 NOTE — Assessment & Plan Note (Addendum)
Cont to monitor BP at home and next visit

## 2019-07-13 NOTE — Assessment & Plan Note (Addendum)
Mild to mod, for antibx course,  to f/u any worsening symptoms or concerns  I spent 31 minutes in preparing to see the patient by review of recent labs, imaging and procedures, obtaining and reviewing separately obtained history, communicating with the patient and family or caregiver, ordering medications, tests or procedures, and documenting clinical information in the EHR including the differential Dx, treatment, and any further evaluation and other management of bilat ear pain, anxiety, htn

## 2019-07-13 NOTE — Progress Notes (Addendum)
Patient ID: [name], female   DOB: 08-Sep-1955, 65 y.o.   MRN: [id number]  Virtual Visit via Video Note  I connected with [name] on 07/11/2019  at  3:20 PM EDT by a video enabled telemedicine application and verified that I am speaking with the correct person using two identifiers.  Location: of all participants today Patient: at home Provider: at office   I discussed the limitations of evaluation and management by telemedicine and the availability of in person appointments. The patient expressed understanding and agreed to proceed.  History of Present Illness:  Here with 2-3 days acute onset fever, bilateral ear pain, pressure, headache, general weakness and malaise,  with mild ST and cough, but pt denies chest pain, wheezing, increased sob or doe, orthopnea, PND, increased LE swelling, palpitations, dizziness or syncope.  Pt denies new neurological symptoms such as new headache, or facial or extremity weakness or numbness   Pt denies polydipsia, polyuria  Denies worsening depressive symptoms, suicidal ideation, or panic; has ongoing anxiety, not increased recently.  Past Medical History:  Diagnosis Date  . Abdominal pain, epigastric 10/25/2007  . ADD 01/20/2009  . ALLERGIC RHINITIS 10/15/2006  . ANGIOEDEMA 08/12/2008  . ANXIETY 10/15/2006  . ASTHMATIC BRONCHITIS, ACUTE 01/27/2008  . Cellulitis and abscess of leg, except foot 08/12/2007  . CELLULITIS, [place] 12/21/2008  . CHEST PAIN 06/14/2007  . Chronic anticoagulation 05/16/2016  . Chronic sinus infection 04/12/2010  . COLECTOMY, PARTIAL, WITH ANASTOMOSIS, HX OF 10/15/2006  . COLONIC POLYPS, HX OF 10/15/2006  . CYST, OVARIAN NEC/NOS 10/15/2006  . Dengue 02/22/2010  . DVT, HX OF 10/15/2006  . DYSAUTONOMIA 10/15/2006  . Dysfunction of eustachian tube 05/26/2009  . GI BLEEDING 04/04/2007  . Glossitis 03/06/2008  . HEADACHE, CHRONIC 04/04/2007  . Heart murmur   . Hematemesis 10/25/2007  . HEMORRHOIDS 04/04/2007  . HYPERLIPIDEMIA  12/12/2007  . HYPERTENSION 10/15/2006  . HYPOTHYROIDISM 03/06/2008  . IBS 04/04/2007  . MASTECTOMY, BILATERAL, HX OF 08/12/2009  . NECK MASS 07/01/2007  . NEOP, MALIGNANT, FEMALE BREAST NOS 10/15/2006  . 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 SYNCOPE 07/01/2007  . Wheezing 08/20/2008  . WOUND, OPEN, LEG, WITHOUT COMPLICATION 1/60/1093   Past Surgical History:  Procedure Laterality Date  . ABDOMINAL HYSTERECTOMY    . bilateral mastectomy/reconstruction    . CESAREAN SECTION    . CHOLECYSTECTOMY    . COLONOSCOPY W/ BIOPSIES    . FLEXIBLE SIGMOIDOSCOPY    . LEFT AND RIGHT HEART CATHETERIZATION WITH CORONARY ANGIOGRAM N/A 10/07/2012   Procedure: LEFT AND RIGHT HEART CATHETERIZATION WITH CORONARY ANGIOGRAM;  Surgeon: Jolaine Artist, MD;  Location: [facility] CATH LAB;  Service: Cardiovascular;  Laterality: N/A;  . multiple ablations    . OOPHORECTOMY    . open heart surg    . spigellian hernia      reports that she has never smoked. She has never used smokeless tobacco. She reports that she does not drink alcohol and does not use drugs. family history includes Cancer in her sister; Heart disease in her mother; Pancreatic cancer in her father. Allergies  Allergen Reactions  . Fish Allergy Anaphylaxis and Swelling    Scaled fish, like flounder  . Penicillins Anaphylaxis and Hives    Did it involve swelling of the face/tongue/throat, SOB, or low BP? Yes Did it involve sudden or severe rash/hives, skin peeling, or any reaction on the inside of your mouth or nose? Unk Did you need to seek medical attention at a hospital  or doctor's office? No When did it last happen? "I was 64 years old" If all above answers are "NO", may proceed with cephalosporin use.   [name] [Levofloxacin In D5w] Nausea And Vomiting  . Latex Dermatitis and Rash    If worn for more than a day, this causes blisters    Current Outpatient Medications on File Prior to Visit  Medication Sig Dispense Refill  .  acetaminophen-codeine (TYLENOL #3) 300-30 MG tablet Take 1 tablet by mouth 4 (four) times daily as needed. for pain 120 tablet 0  . benzonatate (TESSALON) 100 MG capsule Take 1-2 capsules (100-200 mg total) by mouth 3 (three) times daily as needed for cough. 60 capsule 0  . Black Elderberry (SAMBUCUS ELDERBERRY PO) Take 1 tablet by mouth daily. Dissolvable ELDERBERRY/VITAMIN C/ZINC fizzy tablet    . cetirizine (ZYRTEC) 10 MG tablet Take 1 tablet (10 mg total) by mouth daily. (Patient taking differently: Take 10 mg by mouth daily as needed for allergies or rhinitis. ) 30 tablet 11  . fluticasone (FLONASE) 50 MCG/ACT nasal spray SHAKE LIQUID AND USE 2 SPRAYS IN EACH NOSTRIL DAILY 16 g 0  . fluticasone (FLOVENT HFA) 44 MCG/ACT inhaler Inhale 2 puffs into the lungs 2 (two) times daily. 1 Inhaler 12  . furosemide (LASIX) 40 MG tablet Take 1 tablet (40 mg total) by mouth daily. 5 tablet 0  . losartan (COZAAR) 25 MG tablet Take by mouth.    . metoprolol succinate (TOPROL-XL) 50 MG 24 hr tablet Take 1 tablet (50 mg total) by mouth daily. Take 1 by mouth daily (Patient taking differently: Take 50 mg by mouth daily. ) 90 tablet 1  . ondansetron (ZOFRAN-ODT) 8 MG disintegrating tablet Take 1 tablet (8 mg total) by mouth every 8 (eight) hours as needed for nausea. 30 tablet 0  . venlafaxine XR (EFFEXOR-XR) 150 MG 24 hr capsule Take 1 capsule (150 mg total) by mouth daily. 90 capsule 2  . vitamin C (ASCORBIC ACID) 500 MG tablet Take 500 mg by mouth daily.     [name] warfarin (COUMADIN) 10 MG tablet Take 1 tablet daily or as directed by anticoagulation clinic (Patient taking differently: Take 10 mg by mouth at bedtime. ) 90 tablet 0  . zolpidem (AMBIEN) 10 MG tablet TAKE 1 TABLET BY MOUTH EVERY DAY AT BEDTIME AS NEEDED (Patient taking differently: Take 10 mg by mouth at bedtime as needed for sleep. ) 90 tablet 1  . zonisamide (ZONEGRAN) 100 MG capsule Take 2 capsules (200 mg total) by mouth at bedtime. 180 capsule 1    No current facility-administered medications on file prior to visit.    Observations/Objective: Alert, NAD, appropriate mood and affect, resps normal, cn 2-12 intact, moves all 4s, no visible rash or swelling Lab Results  Component Value Date   WBC 7.4 02/14/2019   HGB 14.1 02/14/2019   HCT 42.7 02/14/2019   PLT 246 02/14/2019   GLUCOSE 152 (H) 02/06/2019   CHOL 209 (H) 12/13/2011   TRIG 195.0 (H) 12/13/2011   HDL 45.10 12/13/2011   LDLDIRECT 137.4 12/13/2011   LDLCALC 73 02/11/2008   ALT 19 02/06/2019   AST 17 02/06/2019   NA 137 02/06/2019   K 4.2 02/06/2019   CL 101 02/06/2019   CREATININE 1.01 (H) 02/06/2019   BUN 23 02/06/2019   CO2 23 02/06/2019   TSH 3.04 10/02/2012   INR 3.6 (A) 08/09/2018   HGBA1C  08/12/2007    5.2 (NOTE)   The ADA  recommends the following therapeutic goal for glycemic   control related to Hgb A1C measurement:   Goal of Therapy:   < 7.0% Hgb A1C   Reference: American Diabetes Association: Clinical Practice   Recommendations 2008, Diabetes Care,  2008, 31:(Suppl 1).   Assessment and Plan: See notes  Follow Up Instructions: See notes   I discussed the assessment and treatment plan with the patient. The patient was provided an opportunity to ask questions and all were answered. The patient agreed with the plan and demonstrated an understanding of the instructions.   The patient was advised to call back or seek an in-person evaluation if the symptoms worsen or if the condition fails to improve as anticipated.   Jessica Cower, MD

## 2019-07-13 NOTE — Assessment & Plan Note (Signed)
stable overall by history and exam, recent data reviewed with pt, and pt to continue medical treatment as before,  to f/u any worsening symptoms or concerns  

## 2019-07-15 ENCOUNTER — Telehealth: Payer: Medicare Other | Admitting: Internal Medicine

## 2019-07-15 DIAGNOSIS — Z954 Presence of other heart-valve replacement: Secondary | ICD-10-CM | POA: Diagnosis not present

## 2019-07-15 DIAGNOSIS — Z7901 Long term (current) use of anticoagulants: Secondary | ICD-10-CM | POA: Diagnosis not present

## 2019-07-15 DIAGNOSIS — I422 Other hypertrophic cardiomyopathy: Secondary | ICD-10-CM | POA: Diagnosis not present

## 2019-07-15 DIAGNOSIS — I480 Paroxysmal atrial fibrillation: Secondary | ICD-10-CM | POA: Diagnosis not present

## 2019-07-17 DIAGNOSIS — I480 Paroxysmal atrial fibrillation: Secondary | ICD-10-CM | POA: Diagnosis not present

## 2019-07-17 DIAGNOSIS — Z954 Presence of other heart-valve replacement: Secondary | ICD-10-CM | POA: Diagnosis not present

## 2019-07-23 ENCOUNTER — Telehealth: Payer: Self-pay

## 2019-07-23 DIAGNOSIS — K580 Irritable bowel syndrome with diarrhea: Secondary | ICD-10-CM

## 2019-07-23 DIAGNOSIS — R4689 Other symptoms and signs involving appearance and behavior: Secondary | ICD-10-CM | POA: Diagnosis not present

## 2019-07-23 MED ORDER — ACETAMINOPHEN-CODEINE #3 300-30 MG PO TABS: 1.0000 | ORAL_TABLET | Freq: Four times a day (QID) | ORAL | 0 refills | Status: DC | PRN

## 2019-07-23 NOTE — Addendum Note (Signed)
Addended by: Biagio Borg on: 07/23/2019 01:07 PM   Modules accepted: Orders

## 2019-07-23 NOTE — Telephone Encounter (Signed)
Done erx 

## 2019-07-23 NOTE — Telephone Encounter (Signed)
1.Medication Requested:acetaminophen-codeine (TYLENOL #3) 300-30 MG tablet  2. Pharmacy (Name, Street, City):[facility] [place], [place] ([address]  3. On Med List: Yes   4. Last Visit with PCP: 7.13.21   5. Next visit date with PCP: n/a   Agent: Please be advised that RX refills may take up to 3 business days. We ask that you follow-up with your pharmacy.

## 2019-07-25 ENCOUNTER — Telehealth: Payer: Self-pay

## 2019-07-25 NOTE — Telephone Encounter (Signed)
New message    Per patient voiced COVID vaccine on Wednesday, appears to have a menstrual period does not understand why.   The patient is asking for a callback.

## 2019-07-28 NOTE — Telephone Encounter (Signed)
Sent to Dr. John. 

## 2019-07-29 NOTE — Telephone Encounter (Signed)
I would say this is coincidental, and would be considered post menopausal bleeding, and should be evaluated per GYN to make sure nothing serious is causing the bleeding  Does she need GYN referral?

## 2019-07-31 NOTE — Telephone Encounter (Signed)
LDVM for pt of Dr. Gwynn Burly note. I also informed pt to make an appointment with her GYN or if she needs a referral for one to please call the office and let us know so Dr. [name] can send one in for her.

## 2019-08-14 DIAGNOSIS — I480 Paroxysmal atrial fibrillation: Secondary | ICD-10-CM | POA: Diagnosis not present

## 2019-08-14 DIAGNOSIS — Z954 Presence of other heart-valve replacement: Secondary | ICD-10-CM | POA: Diagnosis not present

## 2019-08-22 ENCOUNTER — Telehealth: Payer: Self-pay | Admitting: Internal Medicine

## 2019-08-22 DIAGNOSIS — K580 Irritable bowel syndrome with diarrhea: Secondary | ICD-10-CM

## 2019-08-22 MED ORDER — ACETAMINOPHEN-CODEINE #3 300-30 MG PO TABS: 1.0000 | ORAL_TABLET | Freq: Four times a day (QID) | ORAL | 0 refills | Status: DC | PRN

## 2019-08-22 NOTE — Telephone Encounter (Signed)
Done erx 

## 2019-08-22 NOTE — Telephone Encounter (Signed)
    1.Medication Requested: acetaminophen-codeine (TYLENOL #3) 300-30 MG tablet  2. Pharmacy (Name, Street, [facility]): [facility] [place], [place] ([address]  3. On Med List: yes  4. Last Visit with PCP:  07/11/19 virtual  5. Next visit date with PCP: n/a   Agent: Please be advised that RX refills may take up to 3 business days. We ask that you follow-up with your pharmacy.

## 2019-08-22 NOTE — Telephone Encounter (Signed)
Sent to Dr. John. 

## 2019-08-25 DIAGNOSIS — R202 Paresthesia of skin: Secondary | ICD-10-CM | POA: Diagnosis not present

## 2019-08-25 DIAGNOSIS — R791 Abnormal coagulation profile: Secondary | ICD-10-CM | POA: Diagnosis not present

## 2019-08-25 DIAGNOSIS — I447 Left bundle-branch block, unspecified: Secondary | ICD-10-CM | POA: Diagnosis not present

## 2019-08-25 DIAGNOSIS — R519 Headache, unspecified: Secondary | ICD-10-CM | POA: Diagnosis not present

## 2019-08-25 DIAGNOSIS — R42 Dizziness and giddiness: Secondary | ICD-10-CM | POA: Diagnosis not present

## 2019-08-26 DIAGNOSIS — R519 Headache, unspecified: Secondary | ICD-10-CM | POA: Diagnosis not present

## 2019-08-26 DIAGNOSIS — R110 Nausea: Secondary | ICD-10-CM | POA: Diagnosis not present

## 2019-08-27 DIAGNOSIS — Z950 Presence of cardiac pacemaker: Secondary | ICD-10-CM | POA: Diagnosis not present

## 2019-08-27 DIAGNOSIS — I447 Left bundle-branch block, unspecified: Secondary | ICD-10-CM | POA: Diagnosis not present

## 2019-08-30 ENCOUNTER — Other Ambulatory Visit: Payer: Self-pay | Admitting: Internal Medicine

## 2019-08-31 NOTE — Telephone Encounter (Signed)
Please refill as per office routine med refill policy (all routine meds refilled for 3 mo or monthly per pt preference up to one year from last visit, then month to month grace period for 3 mo, then further med refills will have to be denied)  

## 2019-09-10 DIAGNOSIS — R350 Frequency of micturition: Secondary | ICD-10-CM | POA: Diagnosis not present

## 2019-09-10 DIAGNOSIS — M545 Low back pain: Secondary | ICD-10-CM | POA: Diagnosis not present

## 2019-09-10 DIAGNOSIS — R000 Tachycardia, unspecified: Secondary | ICD-10-CM | POA: Insufficient documentation

## 2019-09-10 DIAGNOSIS — N390 Urinary tract infection, site not specified: Secondary | ICD-10-CM | POA: Diagnosis not present

## 2019-09-10 DIAGNOSIS — C388 Malignant neoplasm of overlapping sites of heart, mediastinum and pleura: Secondary | ICD-10-CM | POA: Insufficient documentation

## 2019-09-11 DIAGNOSIS — I480 Paroxysmal atrial fibrillation: Secondary | ICD-10-CM | POA: Diagnosis not present

## 2019-09-11 DIAGNOSIS — Z954 Presence of other heart-valve replacement: Secondary | ICD-10-CM | POA: Diagnosis not present

## 2019-09-20 ENCOUNTER — Other Ambulatory Visit: Payer: Self-pay

## 2019-09-20 ENCOUNTER — Telehealth (INDEPENDENT_AMBULATORY_CARE_PROVIDER_SITE_OTHER): Payer: Medicare Other | Admitting: Family Medicine

## 2019-09-20 ENCOUNTER — Encounter: Payer: Self-pay | Admitting: Family Medicine

## 2019-09-20 DIAGNOSIS — R110 Nausea: Secondary | ICD-10-CM

## 2019-09-20 DIAGNOSIS — B349 Viral infection, unspecified: Secondary | ICD-10-CM

## 2019-09-20 DIAGNOSIS — Z20822 Contact with and (suspected) exposure to covid-19: Secondary | ICD-10-CM

## 2019-09-20 MED ORDER — ONDANSETRON 8 MG PO TBDP: 8.0000 mg | ORAL_TABLET | Freq: Three times a day (TID) | ORAL | 0 refills | Status: AC | PRN

## 2019-09-20 NOTE — Progress Notes (Signed)
I have discussed the procedure for the virtual visit with the patient who has given consent to proceed with assessment and treatment.   Jodell Cipro, CMA

## 2019-09-20 NOTE — Progress Notes (Signed)
Virtual Visit via Video Note  Subjective  CC:  Chief Complaint  Patient presents with  . Nausea    symptoms started 09/14/19, fully vaccinated, no recent COVID test      I connected with Omer Jack on 09/20/19 at 12:20 PM EDT by a video enabled telemedicine application and verified that I am speaking with the correct person using two identifiers. Location patient: Home Location provider: [facility] [place], Office Persons participating in the virtual visit: [name], Leamon Arnt, MD Reymundo Poll CMA  I discussed the limitations of evaluation and management by telemedicine and the availability of in person appointments. The patient expressed understanding and agreed to proceed. HPI: [name] is a 64 y.o. female who was contacted today to address the problems listed above in the chief complaint.  She has a complicated past medical history reviewed. . One week of feeling badly: having sweats and low grade fevers, also complains of nausea without vomiting, poor appetite, myalgias, mild lightheadedness without vertigo.  She denies localized abdominal pain, dysuria, urinary frequency, change in chronic diarrhea, or any respiratory symptoms.  Specifically no nasal congestion outside of allergies, sinus pain, cough, shortness of breath, ear pain, sore throat, loss of taste or smell.  She has not had any known exposures to [place].  She denies recent tick bites or rash.  She is fully vaccinated, had [employer] vaccine in July.  She has multiple chronic medical problems.  She is on antihypertensives.   Assessment  1. Suspected COVID-19 virus infection   2. Nausea   3. Viral syndrome      Plan   Symptoms are consistent with a viral syndrome: This could be Covid infection, atypical or viral syndrome.  Education counseling given.  Patient lives in a remote area and Covid testing and not readily available.  She can self isolate.  There are no  localizing symptoms or symptoms of serious bacterial infection present.  We will treat with over-the-counter supportive medications.  Ordered Zofran to treat nausea.  Recommend patient monitoring her blood pressure and holding blood pressure medications if becoming hypotensive.  Recommend good hydration.  She will monitor symptoms closely.  She will seek further care if new symptoms develop or current symptoms worsen.   I discussed the assessment and treatment plan with the patient. The patient was provided an opportunity to ask questions and all were answered. The patient agreed with the plan and demonstrated an understanding of the instructions.   The patient was advised to call back or seek an in-person evaluation if the symptoms worsen or if the condition fails to improve as anticipated. Follow up: As needed Visit date not found  Meds ordered this encounter  Medications  . ondansetron (ZOFRAN-ODT) 8 MG disintegrating tablet    Sig: Take 1 tablet (8 mg total) by mouth every 8 (eight) hours as needed for nausea.    Dispense:  30 tablet    Refill:  0      I reviewed the patients updated PMH, FH, and SocHx.    Patient Active Problem List   Diagnosis Date Noted  . Acute ear pain, bilateral 08/29/2018  . Colon cancer screening 08/29/2018  . Subdural hematoma ([place]) 07/04/2018  . Episodic altered awareness 06/05/2018  . 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 Dysfunction of Eustachian tube, bilateral 11/24/2017  . Diarrhea 08/14/2017  . Fever 08/14/2017  . Whole body pain 08/14/2017  . Migraine  equivalent syndrome 05/31/2017  . Ocular motility disturbance 05/31/2017  . Bilateral otitis media with effusion 05/29/2017  . Cough 02/22/2017  . Wheezing 02/22/2017  . Nosebleed 08/29/2016  . Headache 08/29/2016  . Chronic anticoagulation 05/16/2016  . S/P mitral valve replacement 05/16/2016  . Dysuria 05/21/2015  . Other hypertrophic cardiomyopathy ([place]) 03/11/2015  . Syncope 08/26/2014  .  Encounter for well adult exam with abnormal findings 07/08/2014  . Change in vision 01/21/2014  . Ear lesion 08/24/2013  . Right ear pain 04/10/2013  . Left otitis media 12/25/2012  . Chest pain on exertion 10/02/2012  . Dyspnea on exertion 06/06/2012  . Other fatigue 04/05/2012  . Dizzy 02/07/2012  . Fatigue 06/01/2010  . Chronic sinus infection 04/12/2010  . Dengue 02/22/2010  . MASTECTOMY, BILATERAL, HX OF 08/12/2009  . Dysfunction of eustachian tube 05/26/2009  . ADD 01/20/2009  . Hypothyroidism 03/06/2008  . VITAMIN D DEFICIENCY 03/06/2008  . RASH-NONVESICULAR 02/13/2008  . HLD (hyperlipidemia) 12/12/2007  . Hematemesis 10/25/2007  . IBS 04/04/2007  . RECTAL BLEEDING 04/04/2007  . GI BLEEDING 04/04/2007  . SHY-DRAGER SYNDROME 01/17/2007  . VERTIGO 01/17/2007  . 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 NEOP, MALIGNANT, FEMALE BREAST NOS 10/15/2006  . Anxiety state 10/15/2006  . Depression 10/15/2006  . Essential hypertension 10/15/2006  . Allergic rhinitis 10/15/2006  . CYST, OVARIAN NEC/NOS 10/15/2006  . DYSAUTONOMIA 10/15/2006  . DVT, HX OF 10/15/2006  . COLONIC POLYPS, HX OF 10/15/2006  . Personal History of Other Diseases of Digestive Disease 10/15/2006  . COLECTOMY, PARTIAL, WITH ANASTOMOSIS, HX OF 10/15/2006   Current Meds  Medication Sig  . acetaminophen-codeine (TYLENOL #3) 300-30 MG tablet Take 1 tablet by mouth 4 (four) times daily as needed. for pain  . Black Elderberry (SAMBUCUS ELDERBERRY PO) Take 1 tablet by mouth daily. Dissolvable ELDERBERRY/VITAMIN C/ZINC fizzy tablet  . fluticasone (FLONASE) 50 MCG/ACT nasal spray SHAKE LIQUID AND USE 2 SPRAYS IN EACH NOSTRIL DAILY  . levothyroxine (SYNTHROID) 50 MCG tablet Take 1 tablet (50 mcg total) by mouth daily.  [name] losartan (COZAAR) 25 MG tablet Take by mouth.  . metoprolol succinate (TOPROL-XL) 50 MG 24 hr tablet Take 1 tablet (50 mg total) by mouth daily. Take 1 by mouth daily (Patient taking differently: Take  50 mg by mouth daily. )  . venlafaxine XR (EFFEXOR-XR) 150 MG 24 hr capsule TAKE ONE CAPSULE DAILY  . vitamin C (ASCORBIC ACID) 500 MG tablet Take 500 mg by mouth daily.   [name] warfarin (COUMADIN) 10 MG tablet Take 1 tablet daily or as directed by anticoagulation clinic (Patient taking differently: Take 10 mg by mouth at bedtime. )  . zolpidem (AMBIEN) 10 MG tablet TAKE 1 TABLET BY MOUTH EVERY DAY AT BEDTIME AS NEEDED (Patient taking differently: Take 10 mg by mouth at bedtime as needed for sleep. )  . zonisamide (ZONEGRAN) 100 MG capsule Take 2 capsules (200 mg total) by mouth at bedtime.    Allergies: Patient is allergic to fish allergy, penicillins, levaquin [levofloxacin in d5w], and latex. Family History: Patient family history includes Cancer in her sister; Heart disease in her mother; Pancreatic cancer in her father. Social History:  Patient  reports that she has never smoked. She has never used smokeless tobacco. She reports that she does not drink alcohol and does not use drugs.  Review of Systems: Constitutional: Negative for fever malaise or anorexia Cardiovascular: negative for chest pain Respiratory: negative for SOB or persistent cough Gastrointestinal: negative for abdominal pain  OBJECTIVE Vitals: There were no vitals taken for this visit. General: no acute distress , A&Ox3 Nontoxic-appearing, normal speech.  Mild nasal congestion.  No cough or increased work of breathing noted.  Leamon Arnt, MD

## 2019-09-24 ENCOUNTER — Encounter: Payer: Self-pay | Admitting: Internal Medicine

## 2019-09-24 DIAGNOSIS — K580 Irritable bowel syndrome with diarrhea: Secondary | ICD-10-CM

## 2019-09-25 MED ORDER — ACETAMINOPHEN-CODEINE #3 300-30 MG PO TABS: 1.0000 | ORAL_TABLET | Freq: Four times a day (QID) | ORAL | 0 refills | Status: DC | PRN

## 2019-09-26 ENCOUNTER — Encounter: Payer: Self-pay | Admitting: Internal Medicine

## 2019-09-26 ENCOUNTER — Telehealth: Payer: Self-pay

## 2019-09-26 NOTE — Telephone Encounter (Signed)
Dr [name],   I am having severe abdominal pain in my lower abdomen.   It feels like a severe cramp, comes in waves, lasts about 2 min.  It is below my navel and centered.  I am also having fever.  It happens approx 10 times per hour.  It makes me nauseous.  I do not have a bowel obstruction.  I get some relief from reclining, and from sleep.  It does wake me up 2-3 time per night however.  I am dizzy, light-headed and very exhausted.  The abdominal pain started on 9/12.  I had a virtual appointment with a [place] doctor on 9/18.    Please help  My lungs are clear.  My sense of smell is fine.    Above message sent to Dr Quay Burow.  Recommendation from Dr Quay Burow (without me getting more info from the patient b/c I cannot get in touch with her) was to be seen by a provider today.  Left detailed message for patient to be seen today in urgent care or ED.  Left message for her to call me on my direct line in the next 87mins if she has questions/concerns.

## 2019-10-10 DIAGNOSIS — R319 Hematuria, unspecified: Secondary | ICD-10-CM | POA: Diagnosis not present

## 2019-10-10 DIAGNOSIS — R350 Frequency of micturition: Secondary | ICD-10-CM | POA: Diagnosis not present

## 2019-10-10 DIAGNOSIS — N390 Urinary tract infection, site not specified: Secondary | ICD-10-CM | POA: Diagnosis not present

## 2019-10-20 DIAGNOSIS — I480 Paroxysmal atrial fibrillation: Secondary | ICD-10-CM | POA: Diagnosis not present

## 2019-10-20 DIAGNOSIS — I422 Other hypertrophic cardiomyopathy: Secondary | ICD-10-CM | POA: Diagnosis not present

## 2019-10-20 DIAGNOSIS — Z7901 Long term (current) use of anticoagulants: Secondary | ICD-10-CM | POA: Diagnosis not present

## 2019-10-27 ENCOUNTER — Telehealth: Payer: Self-pay | Admitting: Internal Medicine

## 2019-10-27 DIAGNOSIS — K580 Irritable bowel syndrome with diarrhea: Secondary | ICD-10-CM

## 2019-10-27 NOTE — Telephone Encounter (Signed)
   1.Medication Requested:acetaminophen-codeine (TYLENOL #3) 300-30 MG tablet and levothyroxine (SYNTHROID) 50 MCG tablet  2. Pharmacy (Name, Street, City):[facility] [place], [place] ([address]  3. On Med List: yes  4. Last Visit with PCP: 7/9  5. Next visit date with PCP: n/a   Agent: Please be advised that RX refills may take up to 3 business days. We ask that you follow-up with your pharmacy.

## 2019-10-28 ENCOUNTER — Other Ambulatory Visit: Payer: Self-pay

## 2019-10-28 MED ORDER — LEVOTHYROXINE SODIUM 50 MCG PO TABS: 50.0000 ug | ORAL_TABLET | Freq: Every day | ORAL | 3 refills | Status: DC

## 2019-10-28 MED ORDER — LEVOTHYROXINE SODIUM 50 MCG PO TABS: 50.0000 ug | ORAL_TABLET | Freq: Every day | ORAL | 0 refills | Status: DC

## 2019-10-28 MED ORDER — ACETAMINOPHEN-CODEINE #3 300-30 MG PO TABS: 1.0000 | ORAL_TABLET | Freq: Four times a day (QID) | ORAL | 0 refills | Status: DC | PRN

## 2019-10-28 NOTE — Telephone Encounter (Signed)
Done erx 

## 2019-10-28 NOTE — Telephone Encounter (Signed)
Sent to Dr. John. 

## 2019-11-03 DIAGNOSIS — I480 Paroxysmal atrial fibrillation: Secondary | ICD-10-CM | POA: Diagnosis not present

## 2019-11-03 DIAGNOSIS — Z954 Presence of other heart-valve replacement: Secondary | ICD-10-CM | POA: Diagnosis not present

## 2019-11-09 DIAGNOSIS — R350 Frequency of micturition: Secondary | ICD-10-CM | POA: Diagnosis not present

## 2019-11-09 DIAGNOSIS — N390 Urinary tract infection, site not specified: Secondary | ICD-10-CM | POA: Diagnosis not present

## 2019-11-20 ENCOUNTER — Telehealth: Payer: Self-pay | Admitting: Internal Medicine

## 2019-11-20 DIAGNOSIS — K580 Irritable bowel syndrome with diarrhea: Secondary | ICD-10-CM

## 2019-11-20 MED ORDER — ACETAMINOPHEN-CODEINE #3 300-30 MG PO TABS: 1.0000 | ORAL_TABLET | Freq: Four times a day (QID) | ORAL | 0 refills | Status: DC | PRN

## 2019-11-20 NOTE — Telephone Encounter (Signed)
Done for 1 mo refill - tylenol #3  Ok done erx  Ok to let pt know, the [facility] policy for narcotic have changed so I will need to call her at some point to explain about signing a [facility] pain contract and having urinary drug screen done

## 2019-11-20 NOTE — Telephone Encounter (Signed)
Sent to Dr. John. 

## 2019-11-20 NOTE — Telephone Encounter (Signed)
    1.Medication Requested:acetaminophen-codeine (TYLENOL #3) 300-30 MG tablet  2. Pharmacy (Name, Street, City):[facility] [place], [place] ([address]  3. On Med List: yes  4. Last Visit with PCP: 07/11/19  5. Next visit date with PCP: n.a   Agent: Please be advised that RX refills may take up to 3 business days. We ask that you follow-up with your pharmacy.

## 2019-12-01 DIAGNOSIS — I480 Paroxysmal atrial fibrillation: Secondary | ICD-10-CM | POA: Diagnosis not present

## 2019-12-01 DIAGNOSIS — Z954 Presence of other heart-valve replacement: Secondary | ICD-10-CM | POA: Diagnosis not present

## 2019-12-02 ENCOUNTER — Other Ambulatory Visit: Payer: Self-pay

## 2019-12-02 ENCOUNTER — Telehealth (INDEPENDENT_AMBULATORY_CARE_PROVIDER_SITE_OTHER): Payer: Medicare Other | Admitting: Internal Medicine

## 2019-12-02 DIAGNOSIS — R310 Gross hematuria: Secondary | ICD-10-CM

## 2019-12-02 DIAGNOSIS — E039 Hypothyroidism, unspecified: Secondary | ICD-10-CM

## 2019-12-02 DIAGNOSIS — N390 Urinary tract infection, site not specified: Secondary | ICD-10-CM

## 2019-12-02 DIAGNOSIS — I10 Essential (primary) hypertension: Secondary | ICD-10-CM

## 2019-12-02 DIAGNOSIS — H9201 Otalgia, right ear: Secondary | ICD-10-CM

## 2019-12-02 DIAGNOSIS — R52 Pain, unspecified: Secondary | ICD-10-CM | POA: Insufficient documentation

## 2019-12-02 MED ORDER — CEFUROXIME AXETIL 250 MG PO TABS: 250.0000 mg | ORAL_TABLET | Freq: Two times a day (BID) | ORAL | 1 refills | Status: DC

## 2019-12-02 NOTE — Progress Notes (Signed)
Patient ID: [name] DATE, female   DOB: 04-10-1955, 64 y.o.   MRN: [id number]  Virtual Visit via Video Note  I connected with Omer Jack on 12/02/19 at 10:00 AM EST by a video enabled telemedicine application and verified that I am speaking with the correct person using two identifiers.  Location of all participants today Patient: at home Provider: at office   I discussed the limitations of evaluation and management by telemedicine and the availability of in person appointments. The patient expressed understanding and agreed to proceed.  History of Present Illness: Here with c/o 1 wk onset right ear pain fever, HA withou ST, cough, and Pt denies chest pain, increased sob or doe, wheezing, orthopnea, PND, increased LE swelling, palpitations, dizziness or syncope.   BP at home < 140/90.  Denies hyper or hypo thyroid symptoms such as voice, skin or hair change.  Also has had several UTI in the past 3 months, seen and tx at [facility] locally but they keep coming back. Denies urinary symptoms such as dysuria, frequency, urgency, flank pain, hematuria or n/v, but asks for urology referral Past Medical History:  Diagnosis Date  . Abdominal pain, epigastric 10/25/2007  . ADD 01/20/2009  . ALLERGIC RHINITIS 10/15/2006  . ANGIOEDEMA 08/12/2008  . ANXIETY 10/15/2006  . ASTHMATIC BRONCHITIS, ACUTE 01/27/2008  . Cellulitis and abscess of leg, except foot 08/12/2007  . CELLULITIS, [place] 12/21/2008  . CHEST PAIN 06/14/2007  . Chronic anticoagulation 05/16/2016  . Chronic sinus infection 04/12/2010  . COLECTOMY, PARTIAL, WITH ANASTOMOSIS, HX OF 10/15/2006  . COLONIC POLYPS, HX OF 10/15/2006  . CYST, OVARIAN NEC/NOS 10/15/2006  . Dengue 02/22/2010  . DVT, HX OF 10/15/2006  . DYSAUTONOMIA 10/15/2006  . Dysfunction of eustachian tube 05/26/2009  . GI BLEEDING 04/04/2007  . Glossitis 03/06/2008  . HEADACHE, CHRONIC 04/04/2007  . Heart murmur   . Hematemesis 10/25/2007  . HEMORRHOIDS 04/04/2007  . HYPERLIPIDEMIA  12/12/2007  . HYPERTENSION 10/15/2006  . HYPOTHYROIDISM 03/06/2008  . IBS 04/04/2007  . MASTECTOMY, BILATERAL, HX OF 08/12/2009  . NECK MASS 07/01/2007  . NEOP, MALIGNANT, FEMALE BREAST NOS 10/15/2006  . 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 SYNCOPE 07/01/2007  . Wheezing 08/20/2008  . WOUND, OPEN, LEG, WITHOUT COMPLICATION 6/76/1950   Past Surgical History:  Procedure Laterality Date  . ABDOMINAL HYSTERECTOMY    . bilateral mastectomy/reconstruction    . CESAREAN SECTION    . CHOLECYSTECTOMY    . COLONOSCOPY W/ BIOPSIES    . FLEXIBLE SIGMOIDOSCOPY    . LEFT AND RIGHT HEART CATHETERIZATION WITH CORONARY ANGIOGRAM N/A 10/07/2012   Procedure: LEFT AND RIGHT HEART CATHETERIZATION WITH CORONARY ANGIOGRAM;  Surgeon: Jolaine Artist, MD;  Location: [facility] CATH LAB;  Service: Cardiovascular;  Laterality: N/A;  . multiple ablations    . OOPHORECTOMY    . open heart surg    . spigellian hernia      reports that she has never smoked. She has never used smokeless tobacco. She reports that she does not drink alcohol and does not use drugs. family history includes Cancer in her sister; Heart disease in her mother; Pancreatic cancer in her father. Allergies  Allergen Reactions  . Fish Allergy Anaphylaxis and Swelling    Scaled fish, like flounder  . Penicillins Anaphylaxis and Hives    Did it involve swelling of the face/tongue/throat, SOB, or low BP? Yes Did it involve sudden or severe rash/hives, skin peeling, or any reaction on the inside of your mouth or nose?  Unk Did you need to seek medical attention at a hospital or doctor's office? No When did it last happen? "I was 64 years old" If all above answers are "NO", may proceed with cephalosporin use.   [name] [Levofloxacin In D5w] Nausea And Vomiting  . Latex Dermatitis and Rash    If worn for more than a day, this causes blisters    Current Outpatient Medications on File Prior to Visit  Medication Sig Dispense Refill  .  acetaminophen-codeine (TYLENOL #3) 300-30 MG tablet Take 1 tablet by mouth 4 (four) times daily as needed. for pain 120 tablet 0  . benzonatate (TESSALON) 100 MG capsule Take 1-2 capsules (100-200 mg total) by mouth 3 (three) times daily as needed for cough. (Patient not taking: Reported on 09/20/2019) 60 capsule 0  . Black Elderberry (SAMBUCUS ELDERBERRY PO) Take 1 tablet by mouth daily. Dissolvable ELDERBERRY/VITAMIN C/ZINC fizzy tablet    . cetirizine (ZYRTEC) 10 MG tablet Take 1 tablet (10 mg total) by mouth daily. (Patient taking differently: Take 10 mg by mouth daily as needed for allergies or rhinitis. ) 30 tablet 11  . fluticasone (FLONASE) 50 MCG/ACT nasal spray SHAKE LIQUID AND USE 2 SPRAYS IN EACH NOSTRIL DAILY 16 g 0  . fluticasone (FLOVENT HFA) 44 MCG/ACT inhaler Inhale 2 puffs into the lungs 2 (two) times daily. (Patient not taking: Reported on 09/20/2019) 1 Inhaler 12  . furosemide (LASIX) 40 MG tablet Take 1 tablet (40 mg total) by mouth daily. (Patient not taking: Reported on 09/20/2019) 5 tablet 0  . levothyroxine (SYNTHROID) 50 MCG tablet Take 1 tablet (50 mcg total) by mouth daily. 90 tablet 0  . losartan (COZAAR) 25 MG tablet Take by mouth.    . metoprolol succinate (TOPROL-XL) 50 MG 24 hr tablet Take 1 tablet (50 mg total) by mouth daily. Take 1 by mouth daily (Patient taking differently: Take 50 mg by mouth daily. ) 90 tablet 1  . ondansetron (ZOFRAN-ODT) 8 MG disintegrating tablet Take 1 tablet (8 mg total) by mouth every 8 (eight) hours as needed for nausea. 30 tablet 0  . venlafaxine XR (EFFEXOR-XR) 150 MG 24 hr capsule TAKE ONE CAPSULE DAILY 90 capsule 2  . vitamin C (ASCORBIC ACID) 500 MG tablet Take 500 mg by mouth daily.     [name] warfarin (COUMADIN) 10 MG tablet Take 1 tablet daily or as directed by anticoagulation clinic (Patient taking differently: Take 10 mg by mouth at bedtime. ) 90 tablet 0  . zolpidem (AMBIEN) 10 MG tablet TAKE 1 TABLET BY MOUTH EVERY DAY AT BEDTIME AS  NEEDED (Patient taking differently: Take 10 mg by mouth at bedtime as needed for sleep. ) 90 tablet 1  . zonisamide (ZONEGRAN) 100 MG capsule Take 2 capsules (200 mg total) by mouth at bedtime. 180 capsule 1   No current facility-administered medications on file prior to visit.    Observations/Objective: Alert, NAD, appropriate mood and affect, resps normal, cn 2-12 intact, moves all 4s, no visible rash or swelling Lab Results  Component Value Date   WBC 7.4 02/14/2019   HGB 14.1 02/14/2019   HCT 42.7 02/14/2019   PLT 246 02/14/2019   GLUCOSE 152 (H) 02/06/2019   CHOL 209 (H) 12/13/2011   TRIG 195.0 (H) 12/13/2011   HDL 45.10 12/13/2011   LDLDIRECT 137.4 12/13/2011   LDLCALC 73 02/11/2008   ALT 19 02/06/2019   AST 17 02/06/2019   NA 137 02/06/2019   K 4.2 02/06/2019   CL  101 02/06/2019   CREATININE 1.01 (H) 02/06/2019   BUN 23 02/06/2019   CO2 23 02/06/2019   TSH 3.04 10/02/2012   INR 3.6 (A) 08/09/2018   HGBA1C  08/12/2007    5.2 (NOTE)   The ADA recommends the following therapeutic goal for glycemic   control related to Hgb A1C measurement:   Goal of Therapy:   < 7.0% Hgb A1C   Reference: American Diabetes Association: Clinical Practice   Recommendations 2008, Diabetes Care,  2008, 31:(Suppl 1).   Assessment and Plan: See notes  Follow Up Instructions: See notes   I discussed the assessment and treatment plan with the patient. The patient was provided an opportunity to ask questions and all were answered. The patient agreed with the plan and demonstrated an understanding of the instructions.   The patient was advised to call back or seek an in-person evaluation if the symptoms worsen or if the condition fails to improve as anticipated.  Cathlean Cower, MD

## 2019-12-07 ENCOUNTER — Encounter: Payer: Self-pay | Admitting: Internal Medicine

## 2019-12-07 NOTE — Assessment & Plan Note (Signed)
For urology referral 

## 2019-12-07 NOTE — Assessment & Plan Note (Signed)
stable overall by history and exam, recent data reviewed with pt, and pt to continue medical treatment as before,  to f/u any worsening symptoms or concerns  

## 2019-12-07 NOTE — Assessment & Plan Note (Signed)
Indication for chronic opioid: chronic diarrhea Medication and dose: tylenol #3 # pills per month: 120 Last UDS date: tbd Opioid Treatment Agreement signed (Y/N): yes Opioid Treatment Agreement last reviewed with patient:  Dec 06, 2019 [place] reviewed this encounter (include red flags):  yes

## 2019-12-07 NOTE — Patient Instructions (Signed)
Please take all new medication as prescribed ? ?You will be contacted regarding the referral for: urology ?

## 2019-12-07 NOTE — Assessment & Plan Note (Addendum)
Mild to mod, for antibx course,  to f/u any worsening symptoms or concerns  I spent 31 minutes in preparing to see the patient by review of recent labs, imaging and procedures, obtaining and reviewing separately obtained history, communicating with the patient and family or caregiver, ordering medications, tests or procedures, and documenting clinical information in the EHR including the differential Dx, treatment, and any further evaluation and other management of right ear pain, recurrent uti, hypothryoidism, gross hematuria, htn, pain management

## 2019-12-22 DIAGNOSIS — Z954 Presence of other heart-valve replacement: Secondary | ICD-10-CM | POA: Diagnosis not present

## 2019-12-22 DIAGNOSIS — I480 Paroxysmal atrial fibrillation: Secondary | ICD-10-CM | POA: Diagnosis not present

## 2019-12-22 DIAGNOSIS — Z7901 Long term (current) use of anticoagulants: Secondary | ICD-10-CM | POA: Diagnosis not present

## 2019-12-22 DIAGNOSIS — I422 Other hypertrophic cardiomyopathy: Secondary | ICD-10-CM | POA: Diagnosis not present

## 2019-12-24 ENCOUNTER — Encounter: Payer: Self-pay | Admitting: Internal Medicine

## 2019-12-24 DIAGNOSIS — R350 Frequency of micturition: Secondary | ICD-10-CM

## 2019-12-29 DIAGNOSIS — Z954 Presence of other heart-valve replacement: Secondary | ICD-10-CM | POA: Diagnosis not present

## 2019-12-29 DIAGNOSIS — I480 Paroxysmal atrial fibrillation: Secondary | ICD-10-CM | POA: Diagnosis not present

## 2019-12-30 ENCOUNTER — Telehealth: Payer: Self-pay | Admitting: Internal Medicine

## 2019-12-30 DIAGNOSIS — K580 Irritable bowel syndrome with diarrhea: Secondary | ICD-10-CM

## 2019-12-30 MED ORDER — ACETAMINOPHEN-CODEINE #3 300-30 MG PO TABS: 1.0000 | ORAL_TABLET | Freq: Four times a day (QID) | ORAL | 0 refills | Status: DC | PRN

## 2019-12-30 NOTE — Telephone Encounter (Signed)
1.Medication Requested:acetaminophen-codeine (TYLENOL #3) 300-30 MG tablet    2. Pharmacy (Name, Street, [facility]): [facility] [phone] - [place], [place] - [address]  3. On Med List: yes   4. Last Visit with PCP: 11.30.21  5. Next visit date with PCP: n/a    Agent: Please be advised that RX refills may take up to 3 business days. We ask that you follow-up with your pharmacy.

## 2019-12-30 NOTE — Telephone Encounter (Signed)
Sent to PCP ?

## 2020-01-15 ENCOUNTER — Encounter: Payer: Self-pay | Admitting: Internal Medicine

## 2020-01-15 ENCOUNTER — Telehealth (INDEPENDENT_AMBULATORY_CARE_PROVIDER_SITE_OTHER): Payer: Medicare Other | Admitting: Internal Medicine

## 2020-01-15 DIAGNOSIS — J309 Allergic rhinitis, unspecified: Secondary | ICD-10-CM

## 2020-01-15 DIAGNOSIS — B349 Viral infection, unspecified: Secondary | ICD-10-CM | POA: Diagnosis not present

## 2020-01-15 DIAGNOSIS — H9203 Otalgia, bilateral: Secondary | ICD-10-CM | POA: Diagnosis not present

## 2020-01-15 MED ORDER — ONDANSETRON HCL 4 MG PO TABS: 4.0000 mg | ORAL_TABLET | Freq: Three times a day (TID) | ORAL | 0 refills | Status: DC | PRN

## 2020-01-15 NOTE — Assessment & Plan Note (Signed)
Symptoms c/w viral illness, encouraged pt to be covid tested and let us know the results

## 2020-01-15 NOTE — Assessment & Plan Note (Signed)
D/w pt, highly doubtful otitis media, for tylenol prn

## 2020-01-15 NOTE — Patient Instructions (Signed)
Please continue all other medications as before, and continue the zyrtec  Please have the pharmacy call with any other refills you may need.  Please continue your efforts at being more active, low cholesterol diet, and weight control.  You are otherwise up to date with prevention measures today.  Please keep your appointments with your specialists as you may have planned

## 2020-01-15 NOTE — Assessment & Plan Note (Signed)
Stable, to continue zyrtec prn,  to f/u any worsening symptoms or concerns

## 2020-01-15 NOTE — Progress Notes (Signed)
Patient ID: [name], female   DOB: [birth year], 65 y.o.   MRN: [id number]  Virtual Visit via Video Note  I connected with Omer Jack on 01/15/20 at  2:40 PM EST by a video enabled telemedicine application and verified that I am speaking with the correct person using two identifiers.  Location of all participants today Patient: at home Provider: at office   I discussed the limitations of evaluation and management by telemedicine and the availability of in person appointments. The patient expressed understanding and agreed to proceed.  History of Present Illness: Here with c/o bilateral ear pain, this time in the context of 3-4 day ST, HA diarrhea, nasal congestion, and nausea. No cough or other chest symptoms.  Has not been tested for covid or exposed that she knows.  Pt denies chest pain, increased sob or doe, wheezing, orthopnea, PND, increased LE swelling, palpitations, dizziness or syncope.   Pt denies polydipsia, polyuria.  Has not been taking zyrtec recently excep once or twice and not really improved with this Past Medical History:  Diagnosis Date  . Abdominal pain, epigastric 10/25/2007  . ADD 01/20/2009  . ALLERGIC RHINITIS 10/15/2006  . ANGIOEDEMA 08/12/2008  . ANXIETY 10/15/2006  . ASTHMATIC BRONCHITIS, ACUTE 01/27/2008  . Cellulitis and abscess of leg, except foot 08/12/2007  . CELLULITIS, [place] 12/21/2008  . CHEST PAIN 06/14/2007  . Chronic anticoagulation 05/16/2016  . Chronic sinus infection 04/12/2010  . COLECTOMY, PARTIAL, WITH ANASTOMOSIS, HX OF 10/15/2006  . COLONIC POLYPS, HX OF 10/15/2006  . CYST, OVARIAN NEC/NOS 10/15/2006  . Dengue 02/22/2010  . DVT, HX OF 10/15/2006  . DYSAUTONOMIA 10/15/2006  . Dysfunction of eustachian tube 05/26/2009  . GI BLEEDING 04/04/2007  . Glossitis 03/06/2008  . HEADACHE, CHRONIC 04/04/2007  . Heart murmur   . Hematemesis 10/25/2007  . HEMORRHOIDS 04/04/2007  . HYPERLIPIDEMIA 12/12/2007  . HYPERTENSION 10/15/2006  . HYPOTHYROIDISM  03/06/2008  . IBS 04/04/2007  . MASTECTOMY, BILATERAL, HX OF 08/12/2009  . NECK MASS 07/01/2007  . NEOP, MALIGNANT, FEMALE BREAST NOS 10/15/2006  . 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 SYNCOPE 07/01/2007  . Wheezing 08/20/2008  . WOUND, OPEN, LEG, WITHOUT COMPLICATION [id number]   Past Surgical History:  Procedure Laterality Date  . ABDOMINAL HYSTERECTOMY    . bilateral mastectomy/reconstruction    . CESAREAN SECTION    . CHOLECYSTECTOMY    . COLONOSCOPY W/ BIOPSIES    . FLEXIBLE SIGMOIDOSCOPY    . LEFT AND RIGHT HEART CATHETERIZATION WITH CORONARY ANGIOGRAM N/A 10/07/2012   Procedure: LEFT AND RIGHT HEART CATHETERIZATION WITH CORONARY ANGIOGRAM;  Surgeon: Jolaine Artist, MD;  Location[facility];  Service: Cardiovascular;  Laterality: N/A;  . multiple ablations    . OOPHORECTOMY    . open heart surg    . spigellian hernia      reports that she has never smoked. She has never used smokeless tobacco. She reports that she does not drink alcohol and does not use drugs. family history includes Cancer in her sister; Heart disease in her mother; Pancreatic cancer in her father. Allergies  Allergen Reactions  . Fish Allergy Anaphylaxis and Swelling    Scaled fish, like flounder  . Penicillins Anaphylaxis and Hives    Did it involve swelling of the face/tongue/throat, SOB, or low BP? Yes Did it involve sudden or severe rash/hives, skin peeling, or any reaction on the inside of your mouth or nose? Unk Did you need to seek medical attention at a hospital or  doctor's office? No When did it last happen? "I was 65 years old" If all above answers are "NO", may proceed with cephalosporin use.   [name] [Levofloxacin In D5w] Nausea And Vomiting  . Latex Dermatitis and Rash    If worn for more than a day, this causes blisters    Current Outpatient Medications on File Prior to Visit  Medication Sig Dispense Refill  . acetaminophen-codeine (TYLENOL #3) 300-30 MG tablet Take 1 tablet by mouth  4 (four) times daily as needed. for pain 120 tablet 0  . benzonatate (TESSALON) 100 MG capsule Take 1-2 capsules (100-200 mg total) by mouth 3 (three) times daily as needed for cough. (Patient not taking: Reported on 09/20/2019) 60 capsule 0  . Black Elderberry (SAMBUCUS ELDERBERRY PO) Take 1 tablet by mouth daily. Dissolvable ELDERBERRY/VITAMIN C/ZINC fizzy tablet    . cefUROXime (CEFTIN) 250 MG tablet Take 1 tablet (250 mg total) by mouth 2 (two) times daily with a meal. 20 tablet 1  . cetirizine (ZYRTEC) 10 MG tablet Take 1 tablet (10 mg total) by mouth daily. (Patient taking differently: Take 10 mg by mouth daily as needed for allergies or rhinitis. ) 30 tablet 11  . fluticasone (FLONASE) 50 MCG/ACT nasal spray SHAKE LIQUID AND USE 2 SPRAYS IN EACH NOSTRIL DAILY 16 g 0  . fluticasone (FLOVENT HFA) 44 MCG/ACT inhaler Inhale 2 puffs into the lungs 2 (two) times daily. (Patient not taking: Reported on 09/20/2019) 1 Inhaler 12  . furosemide (LASIX) 40 MG tablet Take 1 tablet (40 mg total) by mouth daily. (Patient not taking: Reported on 09/20/2019) 5 tablet 0  . levothyroxine (SYNTHROID) 50 MCG tablet Take 1 tablet (50 mcg total) by mouth daily. 90 tablet 0  . losartan (COZAAR) 25 MG tablet Take by mouth.    . metoprolol succinate (TOPROL-XL) 50 MG 24 hr tablet Take 1 tablet (50 mg total) by mouth daily. Take 1 by mouth daily (Patient taking differently: Take 50 mg by mouth daily. ) 90 tablet 1  . ondansetron (ZOFRAN-ODT) 8 MG disintegrating tablet Take 1 tablet (8 mg total) by mouth every 8 (eight) hours as needed for nausea. 30 tablet 0  . venlafaxine XR (EFFEXOR-XR) 150 MG 24 hr capsule TAKE ONE CAPSULE DAILY 90 capsule 2  . vitamin C (ASCORBIC ACID) 500 MG tablet Take 500 mg by mouth daily.     [name] warfarin (COUMADIN) 10 MG tablet Take 1 tablet daily or as directed by anticoagulation clinic (Patient taking differently: Take 10 mg by mouth at bedtime. ) 90 tablet 0  . zolpidem (AMBIEN) 10 MG tablet TAKE  1 TABLET BY MOUTH EVERY DAY AT BEDTIME AS NEEDED (Patient taking differently: Take 10 mg by mouth at bedtime as needed for sleep. ) 90 tablet 1  . zonisamide (ZONEGRAN) 100 MG capsule Take 2 capsules (200 mg total) by mouth at bedtime. 180 capsule 1   No current facility-administered medications on file prior to visit.    Observations/Objective: Alert, NAD, appropriate mood and affect, resps normal, cn 2-12 intact, moves all 4s, no visible rash or swelling Lab Results  Component Value Date   WBC 7.4 02/14/2019   HGB 14.1 02/14/2019   HCT 42.7 02/14/2019   PLT 246 02/14/2019   GLUCOSE 152 (H) 02/06/2019   CHOL 209 (H) 12/13/2011   TRIG 195.0 (H) 12/13/2011   HDL 45.10 12/13/2011   LDLDIRECT 137.4 12/13/2011   LDLCALC 73 02/11/2008   ALT 19 02/06/2019   AST 17 02/06/2019  NA 137 02/06/2019   K 4.2 02/06/2019   CL 101 02/06/2019   CREATININE 1.01 (H) 02/06/2019   BUN 23 02/06/2019   CO2 23 02/06/2019   TSH 3.04 10/02/2012   INR 3.6 (A) 08/09/2018   HGBA1C  08/12/2007    5.2 (NOTE)   The ADA recommends the following therapeutic goal for glycemic   control related to Hgb A1C measurement:   Goal of Therapy:   < 7.0% Hgb A1C   Reference: American Diabetes Association: Clinical Practice   Recommendations 2008, Diabetes Care,  2008, 31:(Suppl 1).   Assessment and Plan: See notes  Follow Up Instructions: See notes   I discussed the assessment and treatment plan with the patient. The patient was provided an opportunity to ask questions and all were answered. The patient agreed with the plan and demonstrated an understanding of the instructions.   The patient was advised to call back or seek an in-person evaluation if the symptoms worsen or if the condition fails to improve as anticipated.  Billie Ruddy, MD

## 2020-01-24 DIAGNOSIS — J0100 Acute maxillary sinusitis, unspecified: Secondary | ICD-10-CM | POA: Diagnosis not present

## 2020-01-24 DIAGNOSIS — H66001 Acute suppurative otitis media without spontaneous rupture of ear drum, right ear: Secondary | ICD-10-CM | POA: Diagnosis not present

## 2020-01-27 DIAGNOSIS — N390 Urinary tract infection, site not specified: Secondary | ICD-10-CM | POA: Diagnosis not present

## 2020-01-27 DIAGNOSIS — R350 Frequency of micturition: Secondary | ICD-10-CM | POA: Diagnosis not present

## 2020-01-27 DIAGNOSIS — R300 Dysuria: Secondary | ICD-10-CM | POA: Diagnosis not present

## 2020-01-30 ENCOUNTER — Telehealth: Payer: Self-pay | Admitting: Internal Medicine

## 2020-01-30 DIAGNOSIS — K580 Irritable bowel syndrome with diarrhea: Secondary | ICD-10-CM

## 2020-01-30 NOTE — Telephone Encounter (Signed)
1.Medication Requested: acetaminophen-codeine (TYLENOL #3) 300-30 MG tablet    2. Pharmacy (Name, Street, [facility]): [facility] [place], [place] ([address]  3. On Med List: yes   4. Last Visit with PCP: 1.13.22  5. Next visit date with PCP: n/a    Agent: Please be advised that RX refills may take up to 3 business days. We ask that you follow-up with your pharmacy.

## 2020-02-02 MED ORDER — ACETAMINOPHEN-CODEINE #3 300-30 MG PO TABS: 1.0000 | ORAL_TABLET | Freq: Four times a day (QID) | ORAL | 0 refills | Status: DC | PRN

## 2020-02-02 NOTE — Telephone Encounter (Signed)
Patient is requesting a med refill for acetaminophen-codeine (TYLENOL #3) 300-30 MG tablet. [facility] [place] ([address]

## 2020-02-15 ENCOUNTER — Other Ambulatory Visit: Payer: Self-pay | Admitting: Family Medicine

## 2020-02-15 DIAGNOSIS — J189 Pneumonia, unspecified organism: Secondary | ICD-10-CM

## 2020-02-15 NOTE — Telephone Encounter (Signed)
Please refill as per office routine med refill policy (all routine meds refilled for 3 mo or monthly per pt preference up to one year from last visit, then month to month grace period for 3 mo, then further med refills will have to be denied)  

## 2020-02-18 DIAGNOSIS — N10 Acute tubulo-interstitial nephritis: Secondary | ICD-10-CM | POA: Diagnosis not present

## 2020-02-18 DIAGNOSIS — R42 Dizziness and giddiness: Secondary | ICD-10-CM | POA: Diagnosis not present

## 2020-02-18 DIAGNOSIS — R109 Unspecified abdominal pain: Secondary | ICD-10-CM | POA: Diagnosis not present

## 2020-02-27 ENCOUNTER — Other Ambulatory Visit: Payer: Self-pay

## 2020-02-27 ENCOUNTER — Encounter: Payer: Self-pay | Admitting: Internal Medicine

## 2020-02-27 ENCOUNTER — Ambulatory Visit (INDEPENDENT_AMBULATORY_CARE_PROVIDER_SITE_OTHER): Payer: Medicare Other | Admitting: Internal Medicine

## 2020-02-27 VITALS — BP 136/82 | HR 61

## 2020-02-27 DIAGNOSIS — H9203 Otalgia, bilateral: Secondary | ICD-10-CM

## 2020-02-27 DIAGNOSIS — I10 Essential (primary) hypertension: Secondary | ICD-10-CM

## 2020-02-27 DIAGNOSIS — K623 Rectal prolapse: Secondary | ICD-10-CM | POA: Diagnosis not present

## 2020-02-27 MED ORDER — CEFUROXIME AXETIL 250 MG PO TABS: 250.0000 mg | ORAL_TABLET | Freq: Two times a day (BID) | ORAL | 0 refills | Status: DC

## 2020-02-27 NOTE — Patient Instructions (Signed)
Please take all new medication as prescribed  - the antibiotic  Please continue all other medications as before, and refills have been done if requested.  Please have the pharmacy call with any other refills you may need.  Please keep your appointments with your specialists as you may have planned  You will be contacted regarding the referral for: Dr Eulah Citizen Helane Rima

## 2020-02-27 NOTE — Progress Notes (Signed)
Patient ID: NYEMAH WATTON, female   DOB: [birth year], 65 y.o.   MRN: [id number]         Chief Complaint:: yearly exam and Ear Pain and rectal prolapse associated with recurrent UTI       HPI:  [name] is a 65 y.o. female here for wellness exam; declines mammogram, dxa, colonoscopy, tdap due to other issue at this time                        Also c/o recurrent UTI tx locally near her home, but asked per local UC to not return as she has underlying condition with rectal prolapse that seems likley related.  Pt quite upset and wants to see colorectal surgeon today.  Denies worsening reflux, abd pain, dysphagia, n/v, bowel change or blood.  But does have 3 days bilateral severe ear pain and pressure, low grade temp.  Has hx of PNA episode jan 2022, also more remote hx of diverticulitis s/p partial colectomy.  Denies urinary symptoms such as dysuria, frequency, urgency, flank pain, hematuria or n/v, fever, chills.  Does remain on chronic coumadin without overt bleeding recent.  No other new complaints   Wt Readings from Last 3 Encounters:  02/06/19 218 lb 1.3 oz (98.9 kg)  07/05/18 218 lb 4.1 oz (99 kg)  09/06/17 215 lb 8 oz (97.8 kg)   BP Readings from Last 3 Encounters:  02/27/20 136/82  02/14/19 124/76  02/06/19 133/70   Immunization History  Administered Date(s) Administered  . H1N1 11/04/2007  . HiB (PRP-OMP) 09/16/2007  . Influenza Whole 11/20/2006, 11/04/2007  . Influenza, Seasonal, Injecte, Preservative Fre 01/30/2012  . Influenza-Unspecified 01/14/2015, 10/17/2019  . Janssen (J&J) SARS-COV-2 Vaccination 07/15/2019, 07/22/2019  . Pneumococcal Polysaccharide-23 02/11/2008  . Td 02/11/2008, 04/21/2008   There are no preventive care reminders to display for this patient.    Past Medical History:  Diagnosis Date  . Abdominal pain, epigastric 10/25/2007  . ADD 01/20/2009  . AKI (acute kidney injury) ([place]) 04/11/2016  . ALLERGIC RHINITIS 10/15/2006  . ANGIOEDEMA 08/12/2008  .  ANXIETY 10/15/2006  . ASTHMATIC BRONCHITIS, ACUTE 01/27/2008  . Cellulitis and abscess of leg, except foot 08/12/2007  . CELLULITIS, [place] 12/21/2008  . CHB (complete heart block) ([place]) 04/14/2016  . CHEST PAIN 06/14/2007  . Chronic anticoagulation 05/16/2016  . Chronic sinus infection 04/12/2010  . COLECTOMY, PARTIAL, WITH ANASTOMOSIS, HX OF 10/15/2006  . COLONIC POLYPS, HX OF 10/15/2006  . CYST, OVARIAN NEC/NOS 10/15/2006  . Dengue 02/22/2010  . DVT, HX OF 10/15/2006  . DYSAUTONOMIA 10/15/2006  . Dysfunction of eustachian tube 05/26/2009  . GI BLEEDING 04/04/2007  . Glossitis 03/06/2008  . HEADACHE, CHRONIC 04/04/2007  . Heart murmur   . Hematemesis 10/25/2007  . HEMORRHOIDS 04/04/2007  . HYPERLIPIDEMIA 12/12/2007  . HYPERTENSION 10/15/2006  . HYPOTHYROIDISM 03/06/2008  . IBS 04/04/2007  . MASTECTOMY, BILATERAL, HX OF 08/12/2009  . NECK MASS 07/01/2007  . NEOP, MALIGNANT, FEMALE BREAST NOS 10/15/2006  . 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 Presence of cardiac pacemaker 04/25/2016  . SYNCOPE 07/01/2007  . Wheezing 08/20/2008  . WOUND, OPEN, LEG, WITHOUT COMPLICATION 7/35/3299   Past Surgical History:  Procedure Laterality Date  . ABDOMINAL HYSTERECTOMY    . bilateral mastectomy/reconstruction    . CESAREAN SECTION    . CHOLECYSTECTOMY    . COLONOSCOPY W/ BIOPSIES    . FLEXIBLE SIGMOIDOSCOPY    . LEFT AND RIGHT HEART CATHETERIZATION WITH CORONARY ANGIOGRAM  N/A 10/07/2012   Procedure: LEFT AND RIGHT HEART CATHETERIZATION WITH CORONARY ANGIOGRAM;  Surgeon: Jolaine Artist, MD;  Location: [facility] CATH LAB;  Service: Cardiovascular;  Laterality: N/A;  . multiple ablations    . OOPHORECTOMY    . open heart surg    . spigellian hernia      reports that she has never smoked. She has never used smokeless tobacco. She reports that she does not drink alcohol and does not use drugs. family history includes Cancer in her sister; Heart disease in her mother; Pancreatic cancer in her father. Allergies   Allergen Reactions  . Fish Allergy Anaphylaxis and Swelling    Scaled fish, like flounder  . Penicillins Anaphylaxis, Hives and Rash    Did it involve swelling of the face/tongue/throat, SOB, or low BP? Yes Did it involve sudden or severe rash/hives, skin peeling, or any reaction on the inside of your mouth or nose? Unk Did you need to seek medical attention at a hospital or doctor's office? No When did it last happen? "I was 65 years old" If all above answers are "NO", may proceed with cephalosporin use.  Other reaction(s): Unknown Did it involve swelling of the face/tongue/throat, SOB, or low BP? Yes Did it involve sudden or severe rash/hives, skin peeling, or any reaction on the inside of your mouth or nose? Unk Did you need to seek medical attention at a hospital or doctor's office? No When did it last happen? "I was 65 years old" If all above answers are "NO", may proceed with cephalosporin use.  . Levofloxacin In D5w Nausea And Vomiting and Swelling    Causes throat swelling  . Nitroglycerin Other (See Comments)    Other reaction(s): Other (See Comments) Loss of consciousness Loss of consciousness Loss of consciousness  . Pneumovax [Pneumococcal Polysaccharide Vaccine] Other (See Comments)    Rash, arm swelling  . Sulfamethoxazole     Other reaction(s): GI bleeding Other reaction(s): Blood Disorder  . Latex Dermatitis and Rash    If worn for more than a day, this causes blisters    Current Outpatient Medications on File Prior to Visit  Medication Sig Dispense Refill  . acetaminophen-codeine (TYLENOL #3) 300-30 MG tablet Take 1 tablet by mouth 4 (four) times daily as needed. for pain 120 tablet 0  . benzonatate (TESSALON) 100 MG capsule Take 1-2 capsules (100-200 mg total) by mouth 3 (three) times daily as needed for cough. 60 capsule 0  . Black Elderberry (SAMBUCUS ELDERBERRY PO) Take 1 tablet by mouth daily. Dissolvable ELDERBERRY/VITAMIN C/ZINC fizzy tablet    . FLOVENT  HFA 44 MCG/ACT inhaler INHALE 2 PUFFS INTO THE LUNGS TWICE DAILY 10.6 g 0  . fluticasone (FLONASE) 50 MCG/ACT nasal spray SHAKE LIQUID AND USE 2 SPRAYS IN EACH NOSTRIL DAILY 16 g 0  . furosemide (LASIX) 40 MG tablet Take 1 tablet (40 mg total) by mouth daily. 5 tablet 0  . levothyroxine (SYNTHROID) 50 MCG tablet Take 1 tablet (50 mcg total) by mouth daily. 90 tablet 0  . losartan (COZAAR) 25 MG tablet Take by mouth.    . metoprolol succinate (TOPROL-XL) 50 MG 24 hr tablet Take 1 tablet (50 mg total) by mouth daily. Take 1 by mouth daily (Patient taking differently: Take 50 mg by mouth daily.) 90 tablet 1  . nitrofurantoin, macrocrystal-monohydrate, (MACROBID) 100 MG capsule Take by mouth.    . ondansetron (ZOFRAN) 4 MG tablet Take 1 tablet (4 mg total) by mouth every 8 (eight) hours  as needed for nausea or vomiting. 30 tablet 0  . ondansetron (ZOFRAN-ODT) 8 MG disintegrating tablet Take 1 tablet (8 mg total) by mouth every 8 (eight) hours as needed for nausea. 30 tablet 0  . venlafaxine XR (EFFEXOR-XR) 150 MG 24 hr capsule TAKE ONE CAPSULE DAILY 90 capsule 2  . vitamin C (ASCORBIC ACID) 500 MG tablet Take 500 mg by mouth daily.     [name] warfarin (COUMADIN) 10 MG tablet Take 1 tablet daily or as directed by anticoagulation clinic (Patient taking differently: Take 10 mg by mouth at bedtime.) 90 tablet 0  . zolpidem (AMBIEN) 10 MG tablet TAKE 1 TABLET BY MOUTH EVERY DAY AT BEDTIME AS NEEDED (Patient taking differently: Take 10 mg by mouth at bedtime as needed for sleep.) 90 tablet 1  . zonisamide (ZONEGRAN) 100 MG capsule Take 2 capsules (200 mg total) by mouth at bedtime. 180 capsule 1  . cetirizine (ZYRTEC) 10 MG tablet Take 1 tablet (10 mg total) by mouth daily. (Patient taking differently: Take 10 mg by mouth daily as needed for allergies or rhinitis. ) 30 tablet 11  . metoprolol tartrate (LOPRESSOR) 50 MG tablet Take by mouth.     No current facility-administered medications on file prior to visit.         ROS:  All others reviewed and negative.  Objective        PE:  BP 136/82   Pulse 61   SpO2 97%                 Constitutional: Pt appears in NAD               HENT: Head: NCAT.                Right Ear: External ear normal.                 Left Ear: External ear normal.  Bilat TMs erythem, bulging               Eyes: . Pupils are equal, round, and reactive to light. Conjunctivae and EOM are normal               Nose: without d/c or deformity               Neck: Neck supple. Gross normal ROM               Cardiovascular: Normal rate and regular rhythm.                 Pulmonary/Chest: Effort normal and breath sounds without rales or wheezing.                Abd:  Soft, NT, ND, + BS, no organomegaly; DRE deferred                Neurological: Pt is alert. At baseline orientation, motor grossly intact               Skin: Skin is warm. No rashes, no other new lesions, LE edema - trace bilat               Psychiatric: Pt behavior is normal without agitation   Micro: none  Cardiac tracings I have personally interpreted today:  none  Pertinent Radiological findings (summarize): none   Lab Results  Component Value Date   WBC 7.4 02/14/2019   HGB 14.1 02/14/2019   HCT 42.7 02/14/2019   PLT 246 02/14/2019  GLUCOSE 152 (H) 02/06/2019   CHOL 209 (H) 12/13/2011   TRIG 195.0 (H) 12/13/2011   HDL 45.10 12/13/2011   LDLDIRECT 137.4 12/13/2011   LDLCALC 73 02/11/2008   ALT 19 02/06/2019   AST 17 02/06/2019   NA 137 02/06/2019   K 4.2 02/06/2019   CL 101 02/06/2019   CREATININE 1.01 (H) 02/06/2019   BUN 23 02/06/2019   CO2 23 02/06/2019   TSH 3.04 10/02/2012   INR 3.6 (A) 08/09/2018   HGBA1C  08/12/2007    5.2 (NOTE)   The ADA recommends the following therapeutic goal for glycemic   control related to Hgb A1C measurement:   Goal of Therapy:   < 7.0% Hgb A1C   Reference: American Diabetes Association: Clinical Practice   Recommendations 2008, Diabetes Care,  2008, 31:(Suppl  1).   Assessment/Plan:  [name] is a 65 y.o. White or Caucasian [1] female with  has a past medical history of Abdominal pain, epigastric (10/25/2007), ADD (01/20/2009), AKI (acute kidney injury) ([place]) (04/11/2016), ALLERGIC RHINITIS (10/15/2006), ANGIOEDEMA (08/12/2008), ANXIETY (10/15/2006), ASTHMATIC BRONCHITIS, ACUTE (01/27/2008), Cellulitis and abscess of leg, except foot (08/12/2007), CELLULITIS, [place]ACE (12/21/2008), CHB (complete heart block) ([place]) (04/14/2016), CHEST PAIN (06/14/2007), Chronic anticoagulation (05/16/2016), Chronic sinus infection (04/12/2010), COLECTOMY, PARTIAL, WITH ANASTOMOSIS, HX OF (10/15/2006), COLONIC POLYPS, HX OF (10/15/2006), CYST, OVARIAN NEC/NOS (10/15/2006), Dengue (02/22/2010), DVT, HX OF (10/15/2006), DYSAUTONOMIA (10/15/2006), Dysfunction of eustachian tube (05/26/2009), GI BLEEDING (04/04/2007), Glossitis (03/06/2008), HEADACHE, CHRONIC (04/04/2007), Heart murmur, Hematemesis (10/25/2007), HEMORRHOIDS (04/04/2007), HYPERLIPIDEMIA (12/12/2007), HYPERTENSION (10/15/2006), HYPOTHYROIDISM (03/06/2008), IBS (04/04/2007), MASTECTOMY, BILATERAL, HX OF (08/12/2009), NECK MASS (07/01/2007), NEOP, MALIGNANT, FEMALE BREAST NOS (10/15/2006), NEOP, MALIGNANT, THYMUS (10/15/2006), Presence of cardiac pacemaker (04/25/2016), SYNCOPE (07/01/2007), Wheezing (08/20/2008), and WOUND, OPEN, LEG, WITHOUT COMPLICATION (4/40/3474).  Acute ear pain, bilateral Mild to mod, for antibx course,  to f/u any worsening symptoms or concerns  Rectal prolapse Worsening, likley assoc with recent recurring UTI, now needs general surgury/colorectal surgury referral  Essential hypertension BP Readings from Last 3 Encounters:  02/27/20 136/82  02/14/19 124/76  02/06/19 133/70   Stable, pt to continue medical treatment  - losartan, lopressor  Current Outpatient Medications (Endocrine & Metabolic):  .  levothyroxine (SYNTHROID) 50 MCG tablet, Take 1 tablet (50 mcg total) by mouth daily.  Current Outpatient  Medications (Cardiovascular):  .  furosemide (LASIX) 40 MG tablet, Take 1 tablet (40 mg total) by mouth daily. [name]  losartan (COZAAR) 25 MG tablet, Take by mouth. .  metoprolol succinate (TOPROL-XL) 50 MG 24 hr tablet, Take 1 tablet (50 mg total) by mouth daily. Take 1 by mouth daily (Patient taking differently: Take 50 mg by mouth daily.) .  metoprolol tartrate (LOPRESSOR) 50 MG tablet, Take by mouth.  Current Outpatient Medications (Respiratory):  .  benzonatate (TESSALON) 100 MG capsule, Take 1-2 capsules (100-200 mg total) by mouth 3 (three) times daily as needed for cough. [name]  FLOVENT HFA 44 MCG/ACT inhaler, INHALE 2 PUFFS INTO THE LUNGS TWICE DAILY .  fluticasone (FLONASE) 50 MCG/ACT nasal spray, SHAKE LIQUID AND USE 2 SPRAYS IN EACH NOSTRIL DAILY .  cetirizine (ZYRTEC) 10 MG tablet, Take 1 tablet (10 mg total) by mouth daily. (Patient taking differently: Take 10 mg by mouth daily as needed for allergies or rhinitis. )  Current Outpatient Medications (Analgesics):  .  acetaminophen-codeine (TYLENOL #3) 300-30 MG tablet, Take 1 tablet by mouth 4 (four) times daily as needed. for pain  Current Outpatient Medications (Hematological):  .  warfarin (COUMADIN) 10 MG  tablet, Take 1 tablet daily or as directed by anticoagulation clinic (Patient taking differently: Take 10 mg by mouth at bedtime.)  Current Outpatient Medications (Other):  [name]  Black Elderberry (SAMBUCUS ELDERBERRY PO), Take 1 tablet by mouth daily. Dissolvable ELDERBERRY/VITAMIN C/ZINC fizzy tablet .  cefUROXime (CEFTIN) 250 MG tablet, Take 1 tablet (250 mg total) by mouth 2 (two) times daily with a meal. .  nitrofurantoin, macrocrystal-monohydrate, (MACROBID) 100 MG capsule, Take by mouth. .  ondansetron (ZOFRAN) 4 MG tablet, Take 1 tablet (4 mg total) by mouth every 8 (eight) hours as needed for nausea or vomiting. .  ondansetron (ZOFRAN-ODT) 8 MG disintegrating tablet, Take 1 tablet (8 mg total) by mouth every 8 (eight) hours as  needed for nausea. [name]  venlafaxine XR (EFFEXOR-XR) 150 MG 24 hr capsule, TAKE ONE CAPSULE DAILY .  vitamin C (ASCORBIC ACID) 500 MG tablet, Take 500 mg by mouth daily.  [name]  zolpidem (AMBIEN) 10 MG tablet, TAKE 1 TABLET BY MOUTH EVERY DAY AT BEDTIME AS NEEDED (Patient taking differently: Take 10 mg by mouth at bedtime as needed for sleep.) .  zonisamide (ZONEGRAN) 100 MG capsule, Take 2 capsules (200 mg total) by mouth at bedtime.   Followup: Return if symptoms worsen or fail to improve.  Cathlean Cower, MD 02/28/2020 3:39 AM [facility]

## 2020-02-28 ENCOUNTER — Encounter: Payer: Self-pay | Admitting: Internal Medicine

## 2020-02-28 NOTE — Assessment & Plan Note (Signed)
Worsening, likley assoc with recent recurring UTI, now needs general surgury/colorectal surgury referral

## 2020-02-28 NOTE — Assessment & Plan Note (Signed)
Mild to mod, for antibx course,  to f/u any worsening symptoms or concerns 

## 2020-02-28 NOTE — Assessment & Plan Note (Signed)
BP Readings from Last 3 Encounters:  02/27/20 136/82  02/14/19 124/76  02/06/19 133/70   Stable, pt to continue medical treatment  - losartan, lopressor  Current Outpatient Medications (Endocrine & Metabolic):  .  levothyroxine (SYNTHROID) 50 MCG tablet, Take 1 tablet (50 mcg total) by mouth daily.  Current Outpatient Medications (Cardiovascular):  .  furosemide (LASIX) 40 MG tablet, Take 1 tablet (40 mg total) by mouth daily. [name]  losartan (COZAAR) 25 MG tablet, Take by mouth. .  metoprolol succinate (TOPROL-XL) 50 MG 24 hr tablet, Take 1 tablet (50 mg total) by mouth daily. Take 1 by mouth daily (Patient taking differently: Take 50 mg by mouth daily.) .  metoprolol tartrate (LOPRESSOR) 50 MG tablet, Take by mouth.  Current Outpatient Medications (Respiratory):  .  benzonatate (TESSALON) 100 MG capsule, Take 1-2 capsules (100-200 mg total) by mouth 3 (three) times daily as needed for cough. [name]  FLOVENT HFA 44 MCG/ACT inhaler, INHALE 2 PUFFS INTO THE LUNGS TWICE DAILY .  fluticasone (FLONASE) 50 MCG/ACT nasal spray, SHAKE LIQUID AND USE 2 SPRAYS IN EACH NOSTRIL DAILY .  cetirizine (ZYRTEC) 10 MG tablet, Take 1 tablet (10 mg total) by mouth daily. (Patient taking differently: Take 10 mg by mouth daily as needed for allergies or rhinitis. )  Current Outpatient Medications (Analgesics):  .  acetaminophen-codeine (TYLENOL #3) 300-30 MG tablet, Take 1 tablet by mouth 4 (four) times daily as needed. for pain  Current Outpatient Medications (Hematological):  .  warfarin (COUMADIN) 10 MG tablet, Take 1 tablet daily or as directed by anticoagulation clinic (Patient taking differently: Take 10 mg by mouth at bedtime.)  Current Outpatient Medications (Other):  [name]  Black Elderberry (SAMBUCUS ELDERBERRY PO), Take 1 tablet by mouth daily. Dissolvable ELDERBERRY/VITAMIN C/ZINC fizzy tablet .  cefUROXime (CEFTIN) 250 MG tablet, Take 1 tablet (250 mg total) by mouth 2 (two) times daily with a meal. .   nitrofurantoin, macrocrystal-monohydrate, (MACROBID) 100 MG capsule, Take by mouth. .  ondansetron (ZOFRAN) 4 MG tablet, Take 1 tablet (4 mg total) by mouth every 8 (eight) hours as needed for nausea or vomiting. .  ondansetron (ZOFRAN-ODT) 8 MG disintegrating tablet, Take 1 tablet (8 mg total) by mouth every 8 (eight) hours as needed for nausea. [name]  venlafaxine XR (EFFEXOR-XR) 150 MG 24 hr capsule, TAKE ONE CAPSULE DAILY .  vitamin C (ASCORBIC ACID) 500 MG tablet, Take 500 mg by mouth daily.  [name]  zolpidem (AMBIEN) 10 MG tablet, TAKE 1 TABLET BY MOUTH EVERY DAY AT BEDTIME AS NEEDED (Patient taking differently: Take 10 mg by mouth at bedtime as needed for sleep.) .  zonisamide (ZONEGRAN) 100 MG capsule, Take 2 capsules (200 mg total) by mouth at bedtime.

## 2020-03-05 ENCOUNTER — Telehealth: Payer: Self-pay | Admitting: Internal Medicine

## 2020-03-05 DIAGNOSIS — K580 Irritable bowel syndrome with diarrhea: Secondary | ICD-10-CM

## 2020-03-05 MED ORDER — ACETAMINOPHEN-CODEINE #3 300-30 MG PO TABS: 1.0000 | ORAL_TABLET | Freq: Four times a day (QID) | ORAL | 0 refills | Status: DC | PRN

## 2020-03-05 NOTE — Telephone Encounter (Signed)
   Patient requesting refill for acetaminophen-codeine (TYLENOL #3) 300-30 MG tablet  [place], [place] ([address]

## 2020-03-08 DIAGNOSIS — K623 Rectal prolapse: Secondary | ICD-10-CM | POA: Diagnosis not present

## 2020-03-09 ENCOUNTER — Telehealth: Payer: Self-pay | Admitting: Internal Medicine

## 2020-03-09 ENCOUNTER — Encounter: Payer: Self-pay | Admitting: Internal Medicine

## 2020-03-09 NOTE — Telephone Encounter (Signed)
Hi Dr. Carlean Purl, we have received an urgent referral for you from [facility] for pt to have a colonoscopy prior to rectopexy. I will sent records sent with referral for your review and advise on scheduling. Thank you.

## 2020-03-10 NOTE — Telephone Encounter (Signed)
Called and spoke to [name]. Scheduled for tomorrow 03-11-20 at 9:30am.

## 2020-03-10 NOTE — Telephone Encounter (Signed)
I could see her tomorrow.  She takes warfarin I need to see her in the office.  There are couple of spots you do not have 1 or 1130 or 350.  If not possible we would do it sometime next week.

## 2020-03-11 ENCOUNTER — Ambulatory Visit (INDEPENDENT_AMBULATORY_CARE_PROVIDER_SITE_OTHER): Payer: BC Managed Care – PPO | Admitting: Internal Medicine

## 2020-03-11 ENCOUNTER — Encounter: Payer: Self-pay | Admitting: Internal Medicine

## 2020-03-11 VITALS — BP 130/70 | HR 72 | Ht 67.5 in | Wt 210.4 lb

## 2020-03-11 DIAGNOSIS — Z7901 Long term (current) use of anticoagulants: Secondary | ICD-10-CM

## 2020-03-11 DIAGNOSIS — N390 Urinary tract infection, site not specified: Secondary | ICD-10-CM | POA: Diagnosis not present

## 2020-03-11 DIAGNOSIS — Z952 Presence of prosthetic heart valve: Secondary | ICD-10-CM

## 2020-03-11 DIAGNOSIS — I480 Paroxysmal atrial fibrillation: Secondary | ICD-10-CM | POA: Diagnosis not present

## 2020-03-11 DIAGNOSIS — K623 Rectal prolapse: Secondary | ICD-10-CM | POA: Diagnosis not present

## 2020-03-11 MED ORDER — CEFUROXIME AXETIL 250 MG PO TABS: 250.0000 mg | ORAL_TABLET | Freq: Every day | ORAL | 0 refills | Status: DC

## 2020-03-11 NOTE — Patient Instructions (Signed)
We have sent the following medications to your pharmacy for you to pick up at your convenience: ceftin  Dr Carlean Purl is going to consult with your [facility] team and we will be in touch about plans.   I appreciate the opportunity to care for you. Silvano Rusk, MD, [facility]

## 2020-03-11 NOTE — Progress Notes (Unsigned)
[name] 65 y.o. 1955-06-26 [id number] Referred by: Leighton Ruff, MD  Assessment & Plan:   Encounter Diagnoses  Name Primary?  . Rectal prolapse Yes  . Recurrent UTI   . Mitral valve replaced   . PAF (paroxysmal atrial fibrillation) ([place])   . Chronic anticoagulation     Colonoscopy prior to surgery is appropriate.  I have explained that to her.  She is a complicated patient at high risk of problems and anticoagulation needs to be held prior to her surgery and her colonoscopy.  Given that she has a mechanical mitral valve I suspect she will need hospitalization and heparin infusion rather than a Lovenox window though I will ask her cardiology team at [facility] about this.  She is certainly at higher risk of problems related to procedures and surgery, risk of stroke while off anticoagulation is a rare but real extra risk and we will need to carefully manage this.  I explained that we need to take time and plan this properly so it is safe and not rush anything, she seems quite fearful and in a bit of a hurry to have her surgery though after discussing this she and her husband seem to comprehend the need to carefully plan this and take her time for this chronic problem.  Usual typical risks of endoscopy and colonoscopy explained to the patient and she understands and agrees to proceed.   I am not sure about the recurrent infections and the cause etc.  I think it is reasonable to take a prophylactic Ceftin for the time being.  I have prescribed that.  Subsequent communication with Dr. Marcello Moores reveals that given the complicated nature of this situation Dr. Marcello Moores did recommend the patient consider surgery at [facility].  She does not think if she is having recurrent UTIs that prolapse repair i.e. rectopexy will solve that problem.  I will contact the patient and discuss this further.  I think it might be in her best interest to have an evaluation at [facility] given the complexities of her overall  medical condition.  I appreciate the opportunity to care for this patient. CC: Biagio Borg, MD Dr. Leighton Ruff Dr. Derinda Sis Dr. Sheryn Bison Hammitt, PA-C Subjective:   Chief Complaint: Rectal prolapse, needs colonoscopy  HPI [name] is a 65 year old white woman here with her husband, long history of irritable bowel syndrome not seen in a decade or so, who presents regarding preoperative evaluation of: Prior to rectal prolapse surgery.  She has a complicated medical history that includes mitral valve replacement, tricuspid valve repair, atrial fibrillation, cecal volvulus and partial colectomy, ovarian cancer,  pacemaker on chronic anticoagulation.  She has had problems with rectal prolapse for some time now and says this is associated with her getting septic.  When asked she has really not been hospitalized with sepsis but gets "near septic".  She was in the emergency department and [facility] and was told that 2 weeks ago.  What it sounds like is she gets frequent urinary tract infections or symptoms of that and she relates it to her rectal prolapse issues.  She feels like if she is not on antibiotics she will get recurrent infections and she is very fearful of becoming septic and quite ill if she is not treated with antibiotics.  Though having had chronic rectal prolapse symptoms for some time now she recently presented to Dr. Marcello Moores and was evaluated and surgery is planned but she needs cardiac evaluation and colonoscopy prior  to her rectal prolapse surgery.  Rectopexy is anticipated.  I am in possession of and reviewed the note from her visit with Dr. Marcello Moores on March 08, 2020.  Whenever the patient tries to urinate she has rectal prolapse.  Her last endoscopic evaluation of the colon was more than 10 years ago with flexible sigmoidoscopy and 2007.  She is currently completing a course of Ceftin 250 mg twice daily prescribed in the emergency department or in urgent care.  She also  thinks in addition to UTIs she can get things like ear infections and she is concerned this is coming from her rectal prolapse.  And [employer]   Allergies  Allergen Reactions  . Fish Allergy Anaphylaxis and Swelling    Scaled fish, like flounder  . Penicillins Anaphylaxis, Hives and Rash    Did it involve swelling of the face/tongue/throat, SOB, or low BP? Yes Did it involve sudden or severe rash/hives, skin peeling, or any reaction on the inside of your mouth or nose? Unk Did you need to seek medical attention at a hospital or doctor's office? No When did it last happen? "I was 65 years old" If all above answers are "NO", may proceed with cephalosporin use.  Other reaction(s): Unknown Did it involve swelling of the face/tongue/throat, SOB, or low BP? Yes Did it involve sudden or severe rash/hives, skin peeling, or any reaction on the inside of your mouth or nose? Unk Did you need to seek medical attention at a hospital or doctor's office? No When did it last happen? "I was 65 years old" If all above answers are "NO", may proceed with cephalosporin use.  . Levofloxacin In D5w Nausea And Vomiting and Swelling    Causes throat swelling  . Nitroglycerin Other (See Comments)    Other reaction(s): Other (See Comments) Loss of consciousness Loss of consciousness Loss of consciousness  . Pneumovax [Pneumococcal Polysaccharide Vaccine] Other (See Comments)    Rash, arm swelling  . Sulfamethoxazole     Other reaction(s): GI bleeding Other reaction(s): Blood Disorder  . Latex Dermatitis and Rash    If worn for more than a day, this causes blisters    Current Meds  Medication Sig  . acetaminophen-codeine (TYLENOL #3) 300-30 MG tablet Take 1 tablet by mouth 4 (four) times daily as needed. for pain  . benzonatate (TESSALON) 100 MG capsule Take 1-2 capsules (100-200 mg total) by mouth 3 (three) times daily as needed for cough.  [name] Black Elderberry (SAMBUCUS ELDERBERRY PO) Take 1 tablet by  mouth daily. Dissolvable ELDERBERRY/VITAMIN C/ZINC fizzy tablet  . cefUROXime (CEFTIN) 250 MG tablet Take 1 tablet (250 mg total) by mouth daily.  . cetirizine (ZYRTEC) 10 MG tablet Take 1 tablet (10 mg total) by mouth daily. (Patient taking differently: Take 10 mg by mouth daily as needed for allergies or rhinitis.)  . FLOVENT HFA 44 MCG/ACT inhaler INHALE 2 PUFFS INTO THE LUNGS TWICE DAILY  . fluticasone (FLONASE) 50 MCG/ACT nasal spray SHAKE LIQUID AND USE 2 SPRAYS IN EACH NOSTRIL DAILY  . furosemide (LASIX) 40 MG tablet Take 1 tablet (40 mg total) by mouth daily.  [name] levothyroxine (SYNTHROID) 50 MCG tablet Take 1 tablet (50 mcg total) by mouth daily.  [name] losartan (COZAAR) 25 MG tablet Take by mouth.  . metoprolol succinate (TOPROL-XL) 50 MG 24 hr tablet Take 1 tablet (50 mg total) by mouth daily. Take 1 by mouth daily (Patient taking differently: Take 50 mg by mouth daily.)  . metoprolol tartrate (LOPRESSOR)  50 MG tablet Take by mouth.  . ondansetron (ZOFRAN) 4 MG tablet Take 1 tablet (4 mg total) by mouth every 8 (eight) hours as needed for nausea or vomiting.  . ondansetron (ZOFRAN-ODT) 8 MG disintegrating tablet Take 1 tablet (8 mg total) by mouth every 8 (eight) hours as needed for nausea.  [name] venlafaxine XR (EFFEXOR-XR) 150 MG 24 hr capsule TAKE ONE CAPSULE DAILY  . vitamin C (ASCORBIC ACID) 500 MG tablet Take 500 mg by mouth daily.   [name] warfarin (COUMADIN) 10 MG tablet Take 1 tablet daily or as directed by anticoagulation clinic (Patient taking differently: Take 10 mg by mouth at bedtime.)  . zolpidem (AMBIEN) 10 MG tablet TAKE 1 TABLET BY MOUTH EVERY DAY AT BEDTIME AS NEEDED (Patient taking differently: Take 10 mg by mouth at bedtime as needed for sleep.)  . zonisamide (ZONEGRAN) 100 MG capsule Take 2 capsules (200 mg total) by mouth at bedtime.  . [DISCONTINUED] cefUROXime (CEFTIN) 250 MG tablet Take 1 tablet (250 mg total) by mouth 2 (two) times daily with a meal.   Past Medical History:   Diagnosis Date  . Abdominal pain, epigastric 10/25/2007  . ADD 01/20/2009  . AKI (acute kidney injury) ([place]) 04/11/2016  . ALLERGIC RHINITIS 10/15/2006  . ANGIOEDEMA 08/12/2008  . ANXIETY 10/15/2006  . ASTHMATIC BRONCHITIS, ACUTE 01/27/2008  . Bowel obstruction ([place])   . Breast cancer ([place])   . Cellulitis and abscess of leg, except foot 08/12/2007  . CELLULITIS, [place] 12/21/2008  . CHB (complete heart block) ([place]) 04/14/2016  . CHEST PAIN 06/14/2007  . Chronic anticoagulation 05/16/2016  . Chronic sinus infection 04/12/2010  . COLECTOMY, PARTIAL, WITH ANASTOMOSIS, HX OF 10/15/2006  . COLONIC POLYPS, HX OF 10/15/2006  . CYST, OVARIAN NEC/NOS 10/15/2006  . Dengue 02/22/2010  . DVT, HX OF 10/15/2006  . DYSAUTONOMIA 10/15/2006  . Dysfunction of eustachian tube 05/26/2009  . GI BLEEDING 04/04/2007  . Glossitis 03/06/2008  . HEADACHE, CHRONIC 04/04/2007  . Heart murmur   . Hematemesis 10/25/2007  . HEMORRHOIDS 04/04/2007  . HYPERLIPIDEMIA 12/12/2007  . HYPERTENSION 10/15/2006  . Hypertrophic cardiomyopathy ([place])   . HYPOTHYROIDISM 03/06/2008  . IBS 04/04/2007  . MASTECTOMY, BILATERAL, HX OF 08/12/2009  . NECK MASS 07/01/2007  . NEOP, MALIGNANT, FEMALE BREAST NOS 10/15/2006  . 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 Ovarian cancer ([place])   . Presence of cardiac pacemaker 04/25/2016  . Red blood cell antibody positive    K  . Seizures ([place])   . Subdural hematoma ([place])   . SYNCOPE 07/01/2007  . Wheezing 08/20/2008  . WOUND, OPEN, LEG, WITHOUT COMPLICATION 04/20/6220   Past Surgical History:  Procedure Laterality Date  . ABDOMINAL HYSTERECTOMY    . APPENDECTOMY    . BREAST RECONSTRUCTION    . CARDIAC ELECTROPHYSIOLOGY MAPPING AND ABLATION    . CESAREAN SECTION    . CHOLECYSTECTOMY    . COLON RESECTION  2005  . COLONOSCOPY W/ BIOPSIES    . FLEXIBLE SIGMOIDOSCOPY    . LEFT AND RIGHT HEART CATHETERIZATION WITH CORONARY ANGIOGRAM N/A 10/07/2012   Procedure: LEFT AND RIGHT HEART CATHETERIZATION WITH  CORONARY ANGIOGRAM;  Surgeon: Jolaine Artist, MD;  Location: [facility] CATH LAB;  Service: Cardiovascular;  Laterality: N/A;  . MASTECTOMY Bilateral   . MINIMALLY INVASIVE TRICUSPID VALVE REPAIR    . MITRAL VALVE REPAIR  2017  . MITRAL VALVE REPLACEMENT    . OOPHORECTOMY    . [place]     Social History  Social History Narrative   Married second marriage    Lives in the [place] area with her husband   Has 7 dogs   Retired   3 sons 1 daughter   Never smoker   No alcohol   1 or 2 caffeinated beverages daily    no drug use         family history includes Diabetes in her son; Heart disease in her mother; Melanoma in her father; Ovarian cancer in her sister; Pancreatic cancer in her father; Uterine cancer in her mother.   Review of Systems See HPI complains of dyspnea on exertion all other review of systems negative Objective:   Physical Exam BP 130/70 (BP Location: Left Arm, Patient Position: Sitting, Cuff Size: Normal)   Pulse 72   Ht 5' 7.5" (1.715 m) Comment: height measured without shoes  Wt 210 lb 6 oz (95.4 kg)   BMI 32.46 kg/m  Obese somewhat anxious white woman in no acute distress Eyes are anicteric Lungs are clear Heart sounds appear normal S1-S2 no rubs murmurs or gallops and there is no JVD The abdomen is obese soft nontender well-healed surgical scars are present.  No organomegaly or mass.\ Rectal exam is deferred. She is alert and oriented x3  Data reviewed is extensive and includes old [facility] gastroenterology notes dating back over a decade ago, cardiology notes, surgery notes, urgent care visits, labs in the EMR and care everywhere.

## 2020-03-16 ENCOUNTER — Other Ambulatory Visit: Payer: Self-pay | Admitting: Internal Medicine

## 2020-03-16 ENCOUNTER — Telehealth: Payer: Self-pay | Admitting: Internal Medicine

## 2020-03-16 DIAGNOSIS — J189 Pneumonia, unspecified organism: Secondary | ICD-10-CM

## 2020-03-16 NOTE — Telephone Encounter (Signed)
I left her a message.  I have had communication with Dr. Marcello Moores who had indicated she suggested [name] have her surgery in [place] at [facility] given the complexity of her situation.  I have not been able to get in touch with the PA that manages her anticoagulation yet.  She has a mitral valve replacement and will probably need to be hospitalized and placed on heparin for a colonoscopy.  It would be advisable to try to coordinate any of this with surgery if possible.  That may best be done at [facility] in my opinion, as well.  I will send her a message via MyChart as well and try to speak to her later today or this week

## 2020-03-26 NOTE — Telephone Encounter (Signed)
Left her a message to call me back. 

## 2020-03-26 NOTE — Telephone Encounter (Signed)
See the documentation and My Chart message  Please explain to her that I want her to see her cardiologist dr. Mina Marble and get clearance and guidance on how to manage her peri-procedure (colonoscopy) anti-coagulation and peri-operative (rectopexy for rectal prolapse) after communicating with Dr. Marcello Moores.  Dr. Marcello Moores and I both think would be best for her to have procedure and surgery at [facility] given her complex cardiac history.  I can explain more by phone after I return next week if needed but should go ahead and get an appointment with Dr. Mina Marble, regardless.

## 2020-03-26 NOTE — Telephone Encounter (Signed)
I spoke with [name] and she will get an appointment with Dr Mina Marble set up and she will read her [facility] message.

## 2020-04-05 ENCOUNTER — Other Ambulatory Visit: Payer: Self-pay | Admitting: Internal Medicine

## 2020-04-05 DIAGNOSIS — N390 Urinary tract infection, site not specified: Secondary | ICD-10-CM | POA: Diagnosis not present

## 2020-04-05 DIAGNOSIS — K623 Rectal prolapse: Secondary | ICD-10-CM | POA: Diagnosis not present

## 2020-04-05 DIAGNOSIS — K580 Irritable bowel syndrome with diarrhea: Secondary | ICD-10-CM

## 2020-04-05 DIAGNOSIS — F1190 Opioid use, unspecified, uncomplicated: Secondary | ICD-10-CM | POA: Diagnosis not present

## 2020-04-05 NOTE — Telephone Encounter (Signed)
Patient requesting refill for acetaminophen-codeine (TYLENOL #3) 300-30 MG tablet  [place], [place] ([address]

## 2020-04-06 MED ORDER — ACETAMINOPHEN-CODEINE #3 300-30 MG PO TABS: 1.0000 | ORAL_TABLET | Freq: Four times a day (QID) | ORAL | 0 refills | Status: DC | PRN

## 2020-04-06 NOTE — Telephone Encounter (Signed)
Acetaminophen-Codeine  Last Visit: 02/27/20 Next Visit: none Last Refill: 03/05/20 Please advise; PMP done

## 2020-04-07 DIAGNOSIS — R300 Dysuria: Secondary | ICD-10-CM | POA: Diagnosis not present

## 2020-04-07 DIAGNOSIS — N3000 Acute cystitis without hematuria: Secondary | ICD-10-CM | POA: Diagnosis not present

## 2020-04-16 DIAGNOSIS — N390 Urinary tract infection, site not specified: Secondary | ICD-10-CM | POA: Diagnosis not present

## 2020-05-01 DIAGNOSIS — R040 Epistaxis: Secondary | ICD-10-CM | POA: Diagnosis not present

## 2020-05-01 DIAGNOSIS — Z7901 Long term (current) use of anticoagulants: Secondary | ICD-10-CM | POA: Diagnosis not present

## 2020-05-02 DIAGNOSIS — R040 Epistaxis: Secondary | ICD-10-CM | POA: Diagnosis not present

## 2020-05-03 DIAGNOSIS — R040 Epistaxis: Secondary | ICD-10-CM | POA: Diagnosis not present

## 2020-05-03 DIAGNOSIS — J342 Deviated nasal septum: Secondary | ICD-10-CM | POA: Diagnosis not present

## 2020-05-06 ENCOUNTER — Telehealth: Payer: Self-pay | Admitting: Internal Medicine

## 2020-05-06 NOTE — Telephone Encounter (Signed)
We need to hold the tylenol #3 while taking the other narcotic, thanks

## 2020-05-06 NOTE — Telephone Encounter (Signed)
PMP done. Patient received Oxycodone-Acetaminophen 5-325 #5 w/0 refills from Dr. Eleanora Neighbor  on 05/02/20 and Hydrocodone-Acetaminophen 5-325 #15 w/0 refills from Dr. Adriana Reams on 05/03/20. Please advise. PMP printed and given to Dr. [name]

## 2020-05-06 NOTE — Telephone Encounter (Signed)
1.Medication Requested: acetaminophen-codeine (TYLENOL #3) 300-30 MG tablet    2. Pharmacy (Name, Street, [facility]): [facility], [place] ([address]  3. On Med List: yes   4. Last Visit with PCP: 02-27-20  5. Next visit date with PCP: n/a    Agent: Please be advised that RX refills may take up to 3 business days. We ask that you follow-up with your pharmacy.

## 2020-05-07 NOTE — Telephone Encounter (Signed)
Patient notified

## 2020-05-07 NOTE — Telephone Encounter (Signed)
Tried calling patient. No answer, left message on patient's VM to let her know that medication cannot be refilled at this time.

## 2020-05-14 ENCOUNTER — Other Ambulatory Visit: Payer: Self-pay | Admitting: Internal Medicine

## 2020-05-14 DIAGNOSIS — K580 Irritable bowel syndrome with diarrhea: Secondary | ICD-10-CM

## 2020-05-19 ENCOUNTER — Telehealth: Payer: Self-pay

## 2020-05-19 NOTE — Telephone Encounter (Signed)
Left voicemail for patient to call office back to schedule an annual wellness visit.

## 2020-05-24 DIAGNOSIS — N390 Urinary tract infection, site not specified: Secondary | ICD-10-CM | POA: Diagnosis not present

## 2020-05-24 DIAGNOSIS — R109 Unspecified abdominal pain: Secondary | ICD-10-CM | POA: Diagnosis not present

## 2020-05-27 ENCOUNTER — Encounter: Payer: Medicare Other | Admitting: Internal Medicine

## 2020-06-02 HISTORY — PX: COLON SURGERY: SHX602

## 2020-06-03 ENCOUNTER — Encounter: Payer: Self-pay | Admitting: Internal Medicine

## 2020-06-07 ENCOUNTER — Other Ambulatory Visit: Payer: Self-pay

## 2020-06-08 ENCOUNTER — Ambulatory Visit (INDEPENDENT_AMBULATORY_CARE_PROVIDER_SITE_OTHER): Payer: BC Managed Care – PPO | Admitting: Internal Medicine

## 2020-06-08 ENCOUNTER — Encounter: Payer: Self-pay | Admitting: Internal Medicine

## 2020-06-08 VITALS — BP 130/82 | HR 62 | Temp 98.6°F | Ht 68.0 in

## 2020-06-08 DIAGNOSIS — H9201 Otalgia, right ear: Secondary | ICD-10-CM

## 2020-06-08 DIAGNOSIS — Z01818 Encounter for other preprocedural examination: Secondary | ICD-10-CM | POA: Diagnosis not present

## 2020-06-08 DIAGNOSIS — I10 Essential (primary) hypertension: Secondary | ICD-10-CM

## 2020-06-08 DIAGNOSIS — F5101 Primary insomnia: Secondary | ICD-10-CM | POA: Diagnosis not present

## 2020-06-08 MED ORDER — CEFUROXIME AXETIL 250 MG PO TABS: 250.0000 mg | ORAL_TABLET | Freq: Two times a day (BID) | ORAL | 1 refills | Status: AC

## 2020-06-08 MED ORDER — ZOLPIDEM TARTRATE 10 MG PO TABS: ORAL_TABLET | ORAL | 1 refills | Status: DC

## 2020-06-08 NOTE — Progress Notes (Signed)
Patient ID: [name], female   DOB: January 20, 1955, 66 y.o.   MRN: [id number]         Chief Complaint:: yearly exam and insomnia, right ear pain, and preop exam       HPI:  [name] is a 65 y.o. female here for wellness exam; declinees covid booster, shingrix, mammogram, dxa, colonoscopy, tdap and pneumovax as she is preparing for high risk colorectal surgury                        Also is scheduled for colectomy per Dr Candise Bowens soon, is felt to be high risk due to CV and turned down by 2 other surgeons per pt but pt with chronic rectal prolapse like symptoms for which she cannot o/w continue; has recurrent right ear pain as several times before with feverish, sinus congestion, and persistent insomnia nightly unable to get to sleep and stay asleep.  Has PPM, Pt denies chest pain, increased sob or doe, wheezing, orthopnea, PND, increased LE swelling, palpitations, dizziness or syncope.   Pt denies polydipsia, polyuria, or new focal neuro s/s.   Pt denies fever, wt loss, night sweats, loss of appetite, or other constitutional symptoms  No other new complaints  Wt Readings from Last 3 Encounters:  03/11/20 210 lb 6 oz (95.4 kg)  02/06/19 218 lb 1.3 oz (98.9 kg)  07/05/18 218 lb 4.1 oz (99 kg)   BP Readings from Last 3 Encounters:  06/08/20 130/82  03/11/20 130/70  02/27/20 136/82   Immunization History  Administered Date(s) Administered  . H1N1 11/04/2007  . HiB (PRP-OMP) 09/16/2007  . Influenza Whole 11/20/2006, 11/04/2007  . Influenza, Seasonal, Injecte, Preservative Fre 01/30/2012  . Influenza-Unspecified 01/14/2015, 10/17/2019  . Janssen (J&J) SARS-COV-2 Vaccination 07/15/2019, 07/22/2019  . Pneumococcal Polysaccharide-23 02/11/2008  . Td 02/11/2008, 04/21/2008   There are no preventive care reminders to display for this patient.    Past Medical History:  Diagnosis Date  . Abdominal pain, epigastric 10/25/2007  . ADD 01/20/2009  . AKI (acute kidney injury) ([place])  04/11/2016  . ALLERGIC RHINITIS 10/15/2006  . ANGIOEDEMA 08/12/2008  . ANXIETY 10/15/2006  . ASTHMATIC BRONCHITIS, ACUTE 01/27/2008  . Bowel obstruction ([place])   . Breast cancer ([place])   . Cellulitis and abscess of leg, except foot 08/12/2007  . CELLULITIS, [employer] 12/21/2008  . CHB (complete heart block) ([place]) 04/14/2016  . CHEST PAIN 06/14/2007  . Chronic anticoagulation 05/16/2016  . Chronic sinus infection 04/12/2010  . COLECTOMY, PARTIAL, WITH ANASTOMOSIS, HX OF 10/15/2006  . COLONIC POLYPS, HX OF 10/15/2006  . CYST, OVARIAN NEC/NOS 10/15/2006  . Dengue 02/22/2010  . DVT, HX OF 10/15/2006  . DYSAUTONOMIA 10/15/2006  . Dysfunction of eustachian tube 05/26/2009  . GI BLEEDING 04/04/2007  . Glossitis 03/06/2008  . HEADACHE, CHRONIC 04/04/2007  . Heart murmur   . Hematemesis 10/25/2007  . HEMORRHOIDS 04/04/2007  . HYPERLIPIDEMIA 12/12/2007  . HYPERTENSION 10/15/2006  . Hypertrophic cardiomyopathy ([place])   . HYPOTHYROIDISM 03/06/2008  . IBS 04/04/2007  . MASTECTOMY, BILATERAL, HX OF 08/12/2009  . NECK MASS 07/01/2007  . NEOP, MALIGNANT, FEMALE BREAST NOS 10/15/2006  . 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 Ovarian cancer ([place])   . Presence of cardiac pacemaker 04/25/2016  . Red blood cell antibody positive    K  . Seizures ([place])   . Subdural hematoma ([place])   . SYNCOPE 07/01/2007  . Wheezing 08/20/2008  . WOUND, OPEN, LEG, WITHOUT COMPLICATION 6/94/8546  Past Surgical History:  Procedure Laterality Date  . ABDOMINAL HYSTERECTOMY    . APPENDECTOMY    . BREAST RECONSTRUCTION    . CARDIAC ELECTROPHYSIOLOGY MAPPING AND ABLATION    . CESAREAN SECTION    . CHOLECYSTECTOMY    . COLON RESECTION  2005  . COLONOSCOPY W/ BIOPSIES    . FLEXIBLE SIGMOIDOSCOPY    . LEFT AND RIGHT HEART CATHETERIZATION WITH CORONARY ANGIOGRAM N/A 10/07/2012   Procedure: LEFT AND RIGHT HEART CATHETERIZATION WITH CORONARY ANGIOGRAM;  Surgeon: Jolaine Artist, MD;  Location: [facility] CATH LAB;  Service: Cardiovascular;  Laterality:  N/A;  . MASTECTOMY Bilateral   . MINIMALLY INVASIVE TRICUSPID VALVE REPAIR    . MITRAL VALVE REPAIR  2017  . MITRAL VALVE REPLACEMENT    . OOPHORECTOMY    . SPIGELIAN HERNIA      reports that she has never smoked. She has never used smokeless tobacco. She reports previous alcohol use. She reports that she does not use drugs. family history includes Diabetes in her son; Heart disease in her mother; Melanoma in her father; Ovarian cancer in her sister; Pancreatic cancer in her father; Uterine cancer in her mother. Allergies  Allergen Reactions  . Fish Allergy Anaphylaxis and Swelling    Scaled fish, like flounder  . Penicillins Anaphylaxis, Hives and Rash    Did it involve swelling of the face/tongue/throat, SOB, or low BP? Yes Did it involve sudden or severe rash/hives, skin peeling, or any reaction on the inside of your mouth or nose? Unk Did you need to seek medical attention at a hospital or doctor's office? No When did it last happen? "I was 65 years old" If all above answers are "NO", may proceed with cephalosporin use.  Other reaction(s): Unknown Did it involve swelling of the face/tongue/throat, SOB, or low BP? Yes Did it involve sudden or severe rash/hives, skin peeling, or any reaction on the inside of your mouth or nose? Unk Did you need to seek medical attention at a hospital or doctor's office? No When did it last happen? "I was 65 years old" If all above answers are "NO", may proceed with cephalosporin use.  . Levofloxacin In D5w Nausea And Vomiting and Swelling    Causes throat swelling  . Nitroglycerin Other (See Comments)    Other reaction(s): Other (See Comments) Loss of consciousness Loss of consciousness Loss of consciousness  . Pneumovax [Pneumococcal Polysaccharide Vaccine] Other (See Comments)    Rash, arm swelling  . Sulfamethoxazole     Other reaction(s): GI bleeding Other reaction(s): Blood Disorder  . Latex Dermatitis and Rash    If worn for more than a  day, this causes blisters    Current Outpatient Medications on File Prior to Visit  Medication Sig Dispense Refill  . acetaminophen-codeine (TYLENOL #3) 300-30 MG tablet TAKE 1 TABLET BY MOUTH FOUR TIMES DAILY AS NEEDED FOR PAIN 120 tablet 0  . Black Elderberry (SAMBUCUS ELDERBERRY PO) Take 1 tablet by mouth daily. Dissolvable ELDERBERRY/VITAMIN C/ZINC fizzy tablet    . fluticasone (FLONASE) 50 MCG/ACT nasal spray SHAKE LIQUID AND USE 2 SPRAYS IN EACH NOSTRIL DAILY 16 g 0  . fluticasone (FLOVENT HFA) 44 MCG/ACT inhaler Inhale 2 puffs into the lungs 2 (two) times daily. Overdue for Annual appt w/labs must see provider for future refills 10.6 g 0  . levothyroxine (SYNTHROID) 50 MCG tablet Take 1 tablet (50 mcg total) by mouth daily. 90 tablet 0  . losartan (COZAAR) 25 MG tablet Take by mouth.    [name]  metoprolol succinate (TOPROL-XL) 50 MG 24 hr tablet Take 1 tablet (50 mg total) by mouth daily. Take 1 by mouth daily (Patient taking differently: Take 50 mg by mouth daily.) 90 tablet 1  . metoprolol tartrate (LOPRESSOR) 50 MG tablet Take by mouth.    . ondansetron (ZOFRAN) 4 MG tablet Take 1 tablet (4 mg total) by mouth every 8 (eight) hours as needed for nausea or vomiting. 30 tablet 0  . ondansetron (ZOFRAN-ODT) 8 MG disintegrating tablet Take 1 tablet (8 mg total) by mouth every 8 (eight) hours as needed for nausea. 30 tablet 0  . venlafaxine XR (EFFEXOR-XR) 150 MG 24 hr capsule TAKE ONE CAPSULE DAILY 90 capsule 2  . vitamin C (ASCORBIC ACID) 500 MG tablet Take 500 mg by mouth daily.     [name] warfarin (COUMADIN) 10 MG tablet Take 1 tablet daily or as directed by anticoagulation clinic (Patient taking differently: Take 10 mg by mouth at bedtime.) 90 tablet 0  . zonisamide (ZONEGRAN) 100 MG capsule Take 2 capsules (200 mg total) by mouth at bedtime. 180 capsule 1  . cetirizine (ZYRTEC) 10 MG tablet Take 1 tablet (10 mg total) by mouth daily. (Patient taking differently: Take 10 mg by mouth daily as needed  for allergies or rhinitis.) 30 tablet 11   No current facility-administered medications on file prior to visit.        ROS:  All others reviewed and negative.  Objective        PE:  BP 130/82 (BP Location: Right Arm, Patient Position: Sitting, Cuff Size: Large)   Pulse 62   Temp 98.6 F (37 C) (Oral)   Ht 5\' 8"  (1.727 m)   SpO2 92%   BMI 31.99 kg/m                 Constitutional: Pt appears in NAD               HENT: Head: NCAT.                Right Ear: External ear normal.                 Left Ear: External ear normal.                Eyes: . Pupils are equal, round, and reactive to light. Conjunctivae and EOM are normal               Nose: without d/c or deformity               Neck: Neck supple. Gross normal ROM               Cardiovascular: Normal rate and regular rhythm.                 Pulmonary/Chest: Effort normal and breath sounds without rales or wheezing.                Abd:  Soft, NT, ND, + BS, no organomegaly               Neurological: Pt is alert. At baseline orientation, motor grossly intact               Skin: Skin is warm. No rashes, no other new lesions, LE edema - none               Psychiatric: Pt behavior is normal without agitation   Micro: none  Cardiac tracings I have personally interpreted  today:  none  Pertinent Radiological findings (summarize): none   Lab Results  Component Value Date   WBC 7.4 02/14/2019   HGB 14.1 02/14/2019   HCT 42.7 02/14/2019   PLT 246 02/14/2019   GLUCOSE 152 (H) 02/06/2019   CHOL 209 (H) 12/13/2011   TRIG 195.0 (H) 12/13/2011   HDL 45.10 12/13/2011   LDLDIRECT 137.4 12/13/2011   LDLCALC 73 02/11/2008   ALT 19 02/06/2019   AST 17 02/06/2019   NA 137 02/06/2019   K 4.2 02/06/2019   CL 101 02/06/2019   CREATININE 1.01 (H) 02/06/2019   BUN 23 02/06/2019   CO2 23 02/06/2019   TSH 3.04 10/02/2012   INR 3.6 (A) 08/09/2018   HGBA1C  08/12/2007    5.2 (NOTE)   The ADA recommends the following therapeutic goal  for glycemic   control related to Hgb A1C measurement:   Goal of Therapy:   < 7.0% Hgb A1C   Reference: American Diabetes Association: Clinical Practice   Recommendations 2008, Diabetes Care,  2008, 31:(Suppl 1).   Assessment/Plan:  [name] is a 65 y.o. White or Caucasian [1] female with  has a past medical history of Abdominal pain, epigastric (10/25/2007), ADD (01/20/2009), AKI (acute kidney injury) ([place]) (04/11/2016), ALLERGIC RHINITIS (10/15/2006), ANGIOEDEMA (08/12/2008), ANXIETY (10/15/2006), ASTHMATIC BRONCHITIS, ACUTE (01/27/2008), Bowel obstruction ([place]), Breast cancer ([place]), Cellulitis and abscess of leg, except foot (08/12/2007), CELLULITIS, FACE (12/21/2008), CHB (complete heart block) ([place]) (04/14/2016), CHEST PAIN (06/14/2007), Chronic anticoagulation (05/16/2016), Chronic sinus infection (04/12/2010), COLECTOMY, PARTIAL, WITH ANASTOMOSIS, HX OF (10/15/2006), COLONIC POLYPS, HX OF (10/15/2006), CYST, OVARIAN NEC/NOS (10/15/2006), Dengue (02/22/2010), DVT, HX OF (10/15/2006), DYSAUTONOMIA (10/15/2006), Dysfunction of eustachian tube (05/26/2009), GI BLEEDING (04/04/2007), Glossitis (03/06/2008), HEADACHE, CHRONIC (04/04/2007), Heart murmur, Hematemesis (10/25/2007), HEMORRHOIDS (04/04/2007), HYPERLIPIDEMIA (12/12/2007), HYPERTENSION (10/15/2006), Hypertrophic cardiomyopathy ([place]), HYPOTHYROIDISM (03/06/2008), IBS (04/04/2007), MASTECTOMY, BILATERAL, HX OF (08/12/2009), NECK MASS (07/01/2007), NEOP, MALIGNANT, FEMALE BREAST NOS (10/15/2006), NEOP, MALIGNANT, THYMUS (10/15/2006), Ovarian cancer ([place]), Presence of cardiac pacemaker (04/25/2016), Red blood cell antibody positive, Seizures ([place]), Subdural hematoma ([place]), SYNCOPE (07/01/2007), Wheezing (08/20/2008), and WOUND, OPEN, LEG, WITHOUT COMPLICATION (5/63/8756).  Right ear pain Mild to mod, for antibx course,  to f/u any worsening symptoms or concerns  Insomnia Mild to mod, for ambien refill,  to f/u any worsening symptoms or concerns  Preop exam for internal  medicine Pt otherwise cleared for surgury as planned  Essential hypertension BP Readings from Last 3 Encounters:  06/08/20 130/82  03/11/20 130/70  02/27/20 136/82   Stable, pt to continue medical treatment losartan, toprol   Followup: Return if symptoms worsen or fail to improve.  Cathlean Cower, MD 06/09/2020 9:14 PM [facility]

## 2020-06-09 ENCOUNTER — Encounter: Payer: Self-pay | Admitting: Internal Medicine

## 2020-06-09 DIAGNOSIS — G4700 Insomnia, unspecified: Secondary | ICD-10-CM | POA: Insufficient documentation

## 2020-06-09 DIAGNOSIS — Z01818 Encounter for other preprocedural examination: Secondary | ICD-10-CM | POA: Insufficient documentation

## 2020-06-09 NOTE — Assessment & Plan Note (Signed)
BP Readings from Last 3 Encounters:  06/08/20 130/82  03/11/20 130/70  02/27/20 136/82   Stable, pt to continue medical treatment losartan, toprol

## 2020-06-09 NOTE — Assessment & Plan Note (Signed)
Mild to mod, for antibx course,  to f/u any worsening symptoms or concerns 

## 2020-06-09 NOTE — Patient Instructions (Signed)
Please take all new medication as prescribed 

## 2020-06-09 NOTE — Assessment & Plan Note (Signed)
Pt otherwise cleared for surgury as planned

## 2020-06-09 NOTE — Assessment & Plan Note (Signed)
Mild to mod, for ambien refill,  to f/u any worsening symptoms or concerns

## 2020-06-12 ENCOUNTER — Other Ambulatory Visit: Payer: Self-pay | Admitting: Internal Medicine

## 2020-06-14 ENCOUNTER — Other Ambulatory Visit: Payer: Self-pay | Admitting: Internal Medicine

## 2020-06-14 DIAGNOSIS — K580 Irritable bowel syndrome with diarrhea: Secondary | ICD-10-CM

## 2020-06-14 NOTE — Telephone Encounter (Signed)
1.Medication Requested: acetaminophen-codeine (TYLENOL #3) 300-30 MG tablet  2. Pharmacy (Name, Street, [facility]): [facility], [place] ([address]  3. On Med List: yes   4. Last Visit with PCP: 06-08-20  5. Next visit date with PCP: n/a    Agent: Please be advised that RX refills may take up to 3 business days. We ask that you follow-up with your pharmacy.

## 2020-06-15 ENCOUNTER — Other Ambulatory Visit: Payer: Self-pay | Admitting: Internal Medicine

## 2020-06-15 MED ORDER — VENLAFAXINE HCL ER 150 MG PO CP24: 150.0000 mg | ORAL_CAPSULE | Freq: Every day | ORAL | 3 refills | Status: DC

## 2020-06-17 ENCOUNTER — Other Ambulatory Visit: Payer: Self-pay | Admitting: Internal Medicine

## 2020-06-17 DIAGNOSIS — J189 Pneumonia, unspecified organism: Secondary | ICD-10-CM

## 2020-06-17 NOTE — Telephone Encounter (Signed)
Please refill as per office routine med refill policy (all routine meds refilled for 3 mo or monthly per pt preference up to one year from last visit, then month to month grace period for 3 mo, then further med refills will have to be denied)  

## 2020-06-25 DIAGNOSIS — Z9889 Other specified postprocedural states: Secondary | ICD-10-CM | POA: Diagnosis not present

## 2020-06-25 DIAGNOSIS — I10 Essential (primary) hypertension: Secondary | ICD-10-CM | POA: Diagnosis not present

## 2020-06-25 DIAGNOSIS — I480 Paroxysmal atrial fibrillation: Secondary | ICD-10-CM | POA: Diagnosis not present

## 2020-06-25 DIAGNOSIS — I422 Other hypertrophic cardiomyopathy: Secondary | ICD-10-CM | POA: Diagnosis not present

## 2020-06-29 DIAGNOSIS — Z8543 Personal history of malignant neoplasm of ovary: Secondary | ICD-10-CM | POA: Diagnosis not present

## 2020-06-29 DIAGNOSIS — I2510 Atherosclerotic heart disease of native coronary artery without angina pectoris: Secondary | ICD-10-CM | POA: Diagnosis not present

## 2020-06-29 DIAGNOSIS — Z87440 Personal history of urinary (tract) infections: Secondary | ICD-10-CM | POA: Diagnosis not present

## 2020-06-29 DIAGNOSIS — I509 Heart failure, unspecified: Secondary | ICD-10-CM | POA: Diagnosis not present

## 2020-06-29 DIAGNOSIS — Z853 Personal history of malignant neoplasm of breast: Secondary | ICD-10-CM | POA: Diagnosis not present

## 2020-06-29 DIAGNOSIS — F32A Depression, unspecified: Secondary | ICD-10-CM | POA: Diagnosis not present

## 2020-06-29 DIAGNOSIS — Z20822 Contact with and (suspected) exposure to covid-19: Secondary | ICD-10-CM | POA: Diagnosis not present

## 2020-06-29 DIAGNOSIS — I422 Other hypertrophic cardiomyopathy: Secondary | ICD-10-CM | POA: Diagnosis not present

## 2020-06-29 DIAGNOSIS — I110 Hypertensive heart disease with heart failure: Secondary | ICD-10-CM | POA: Diagnosis not present

## 2020-06-29 DIAGNOSIS — F419 Anxiety disorder, unspecified: Secondary | ICD-10-CM | POA: Diagnosis not present

## 2020-06-29 DIAGNOSIS — K623 Rectal prolapse: Secondary | ICD-10-CM | POA: Diagnosis not present

## 2020-06-29 DIAGNOSIS — E669 Obesity, unspecified: Secondary | ICD-10-CM | POA: Diagnosis not present

## 2020-06-29 DIAGNOSIS — Z8614 Personal history of Methicillin resistant Staphylococcus aureus infection: Secondary | ICD-10-CM | POA: Diagnosis not present

## 2020-06-29 DIAGNOSIS — Z6833 Body mass index (BMI) 33.0-33.9, adult: Secondary | ICD-10-CM | POA: Diagnosis not present

## 2020-06-29 DIAGNOSIS — E039 Hypothyroidism, unspecified: Secondary | ICD-10-CM | POA: Diagnosis not present

## 2020-06-29 DIAGNOSIS — I4891 Unspecified atrial fibrillation: Secondary | ICD-10-CM | POA: Diagnosis not present

## 2020-07-08 DIAGNOSIS — I422 Other hypertrophic cardiomyopathy: Secondary | ICD-10-CM | POA: Diagnosis not present

## 2020-07-08 DIAGNOSIS — Z7901 Long term (current) use of anticoagulants: Secondary | ICD-10-CM | POA: Diagnosis not present

## 2020-07-12 DIAGNOSIS — Z954 Presence of other heart-valve replacement: Secondary | ICD-10-CM | POA: Diagnosis not present

## 2020-07-13 ENCOUNTER — Encounter: Payer: Self-pay | Admitting: Internal Medicine

## 2020-07-13 DIAGNOSIS — K580 Irritable bowel syndrome with diarrhea: Secondary | ICD-10-CM

## 2020-07-14 ENCOUNTER — Other Ambulatory Visit: Payer: Self-pay | Admitting: Internal Medicine

## 2020-07-14 DIAGNOSIS — J189 Pneumonia, unspecified organism: Secondary | ICD-10-CM

## 2020-07-14 MED ORDER — ACETAMINOPHEN-CODEINE #3 300-30 MG PO TABS: 1.0000 | ORAL_TABLET | Freq: Four times a day (QID) | ORAL | 0 refills | Status: DC | PRN

## 2020-07-15 IMAGING — CT CT HEAD WITHOUT CONTRAST
4 series · 16 of 47 positions shown, 18 images · non-contrast
Comparison: MRI 07/03/2018.  Head CT 09/13/2016.

CLINICAL DATA: 62-year-old female found to have small volume
posterior fossa subdural hematoma on routine outpatient MRI
yesterday done for seizure like activity.

EXAM:
CT HEAD WITHOUT CONTRAST
TECHNIQUE: Contiguous axial images were obtained from the base of the skull
through the vertex without intravenous contrast.

[Series 2: head wo · axial · 0.46mm/px · z∈[-80,+50]mm · 7 of 36 slices shown, 9 images]
[im 5/36  brain]
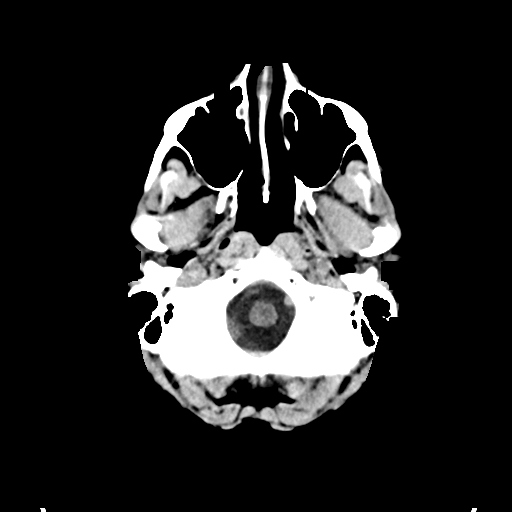
[im 5/36  bone]
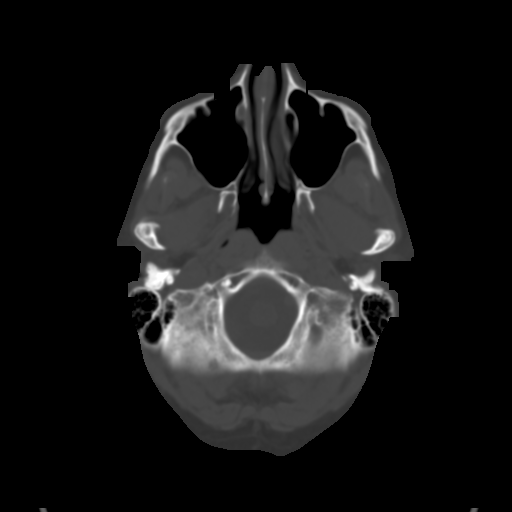
[im 9/36  brain]
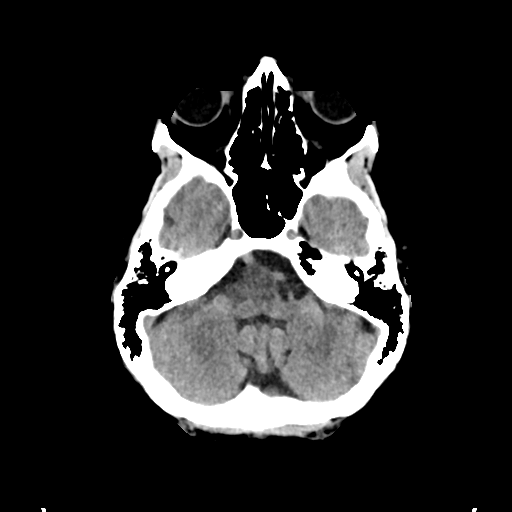
[im 14/36  brain]
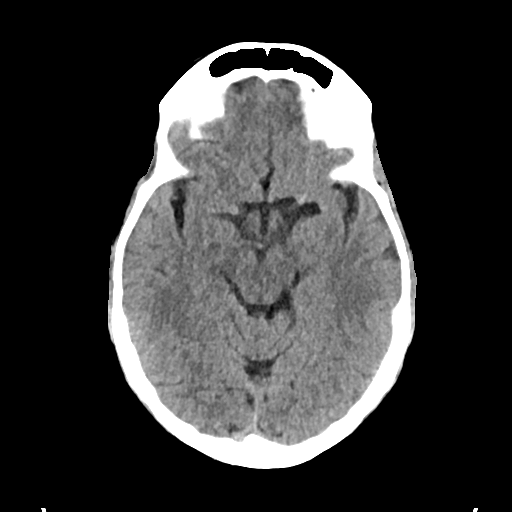
[im 18/36  brain]
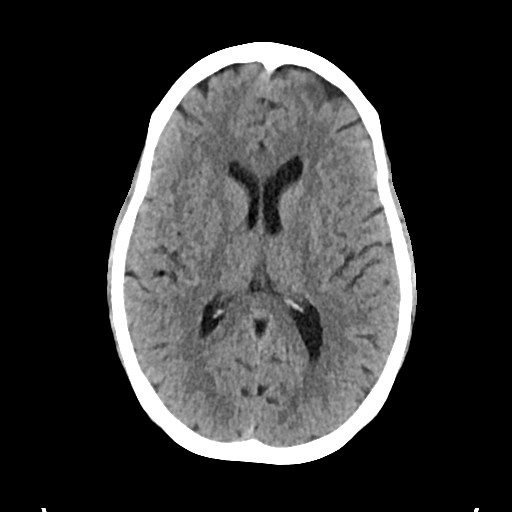
[im 22/36  brain]
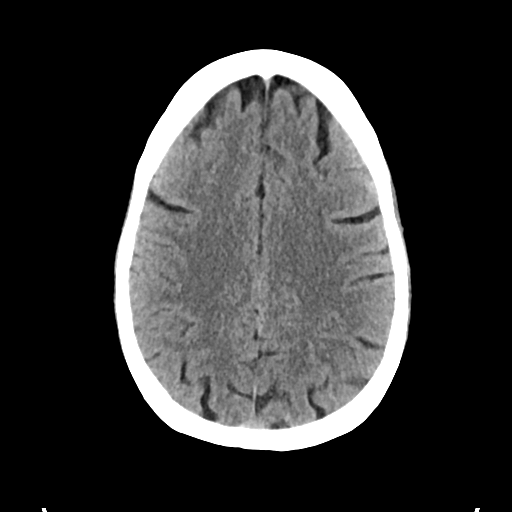
[im 22/36  bone]
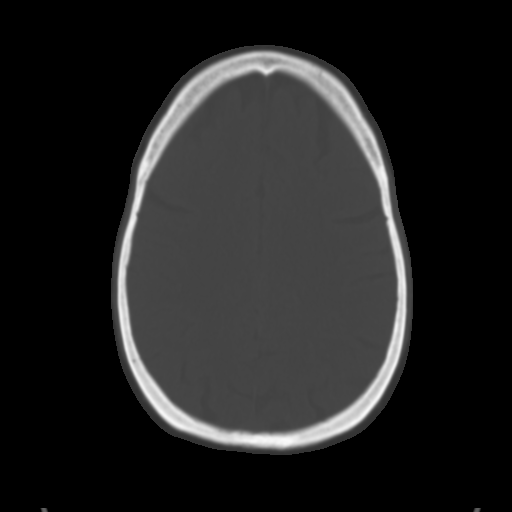
[im 27/36  brain]
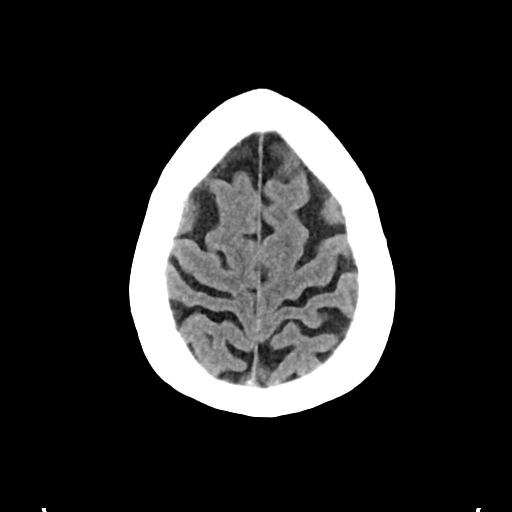
[im 31/36  brain]
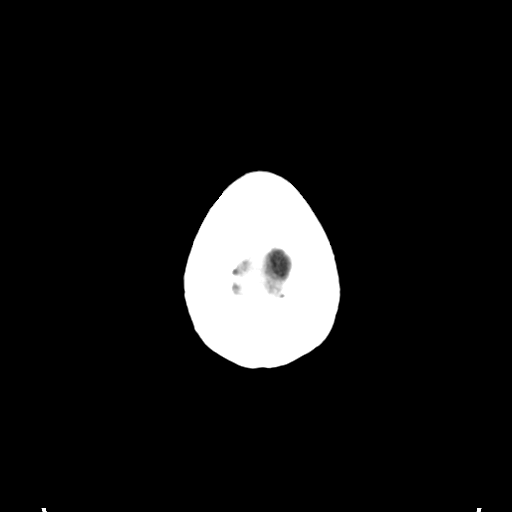

[Series 3: head bone · axial · 0.46mm/px · z∈[-84,-48]mm · 3 of 89 slices shown]
[im 9/89  bone]
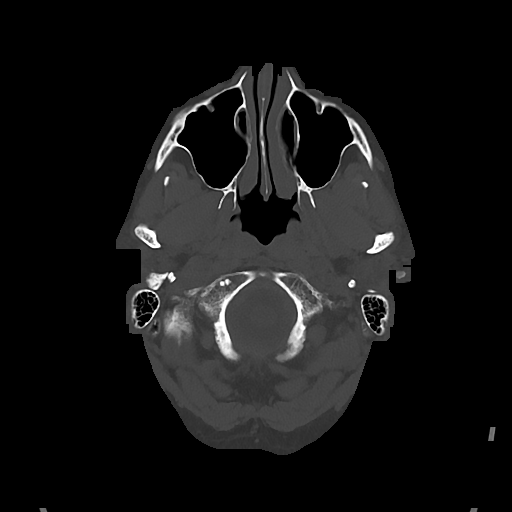
[im 18/89  bone]
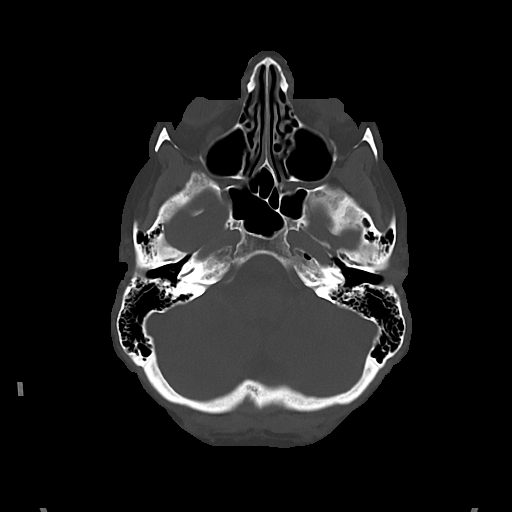
[im 27/89  bone]
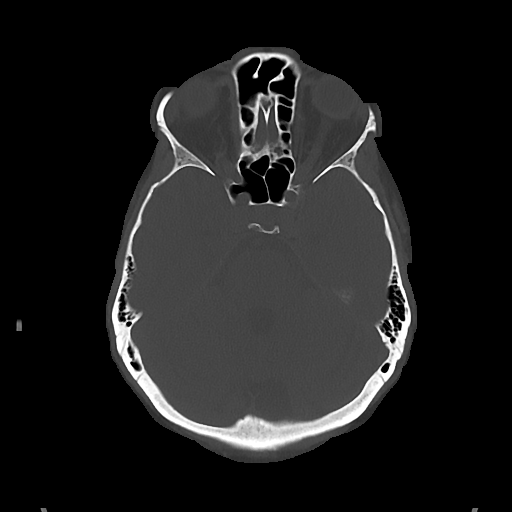

[Series 4: cor soft · coronal · 0.34mm/px · 3 of 79 slices shown]
[im 27/79  brain]
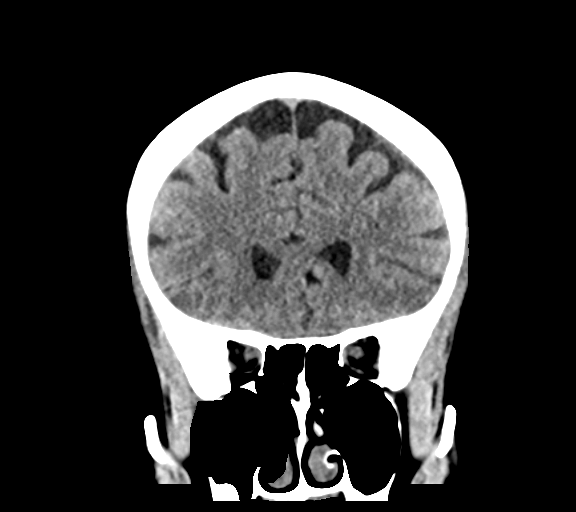
[im 35/79  brain]
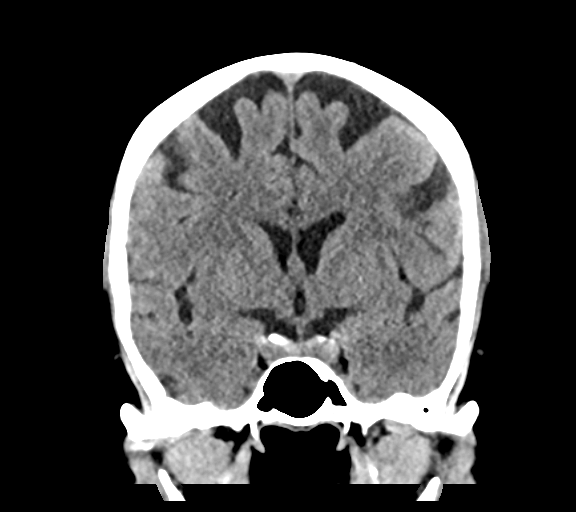
[im 44/79  brain]
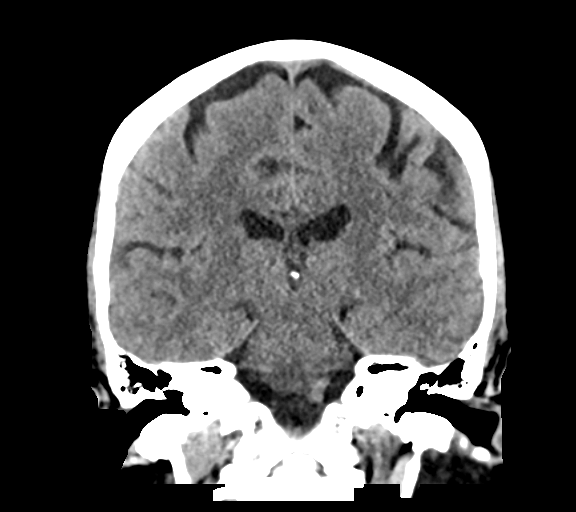

[Series 5: sag soft · sagittal · 0.34mm/px · 3 of 64 slices shown]
[im 22/64  brain]
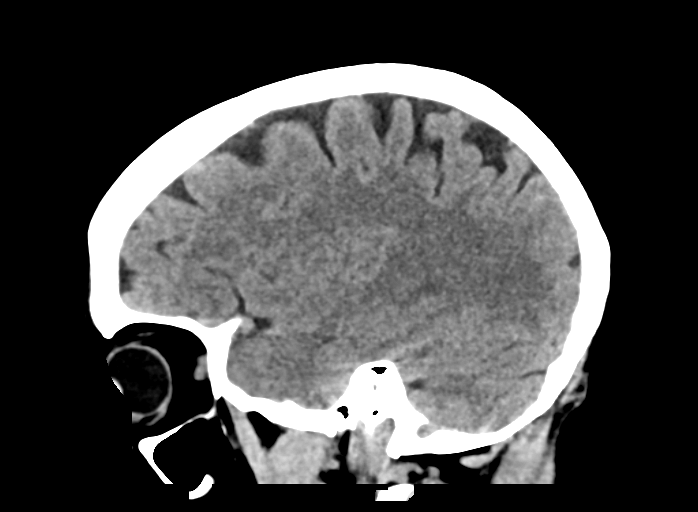
[im 32/64  brain]
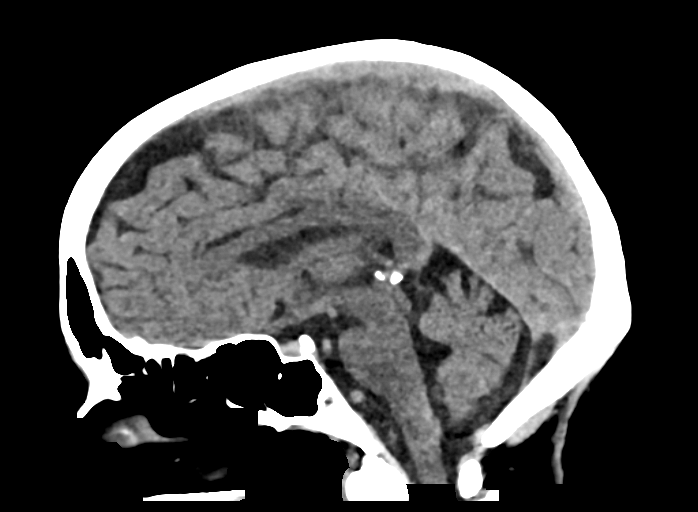
[im 43/64  brain]
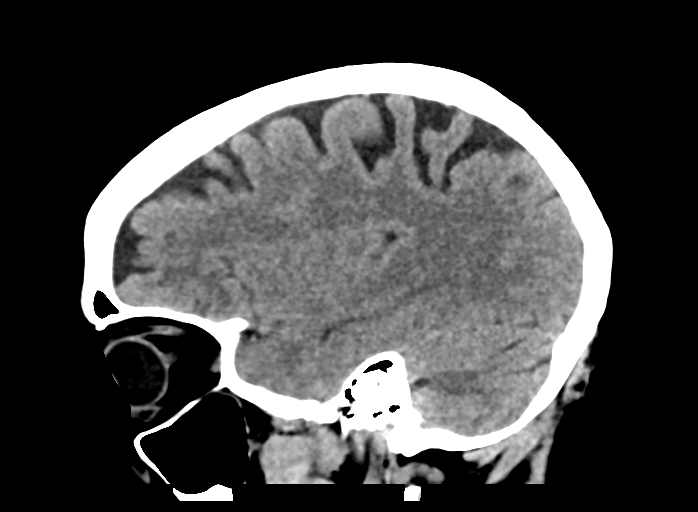

[16 of 47 positions shown; findings below may reference images not displayed]

FINDINGS: Brain: Subtle CT evidence of the small volume posterior fossa
hematoma seen by MRI yesterday (sagittal image 37). And as expected
this is new from the 6311 CT. The blood is intermediate density. No
associated posterior fossa mass effect.

No other intracranial hemorrhage identified. Gray-white matter
differentiation is within normal limits throughout the brain. No
ventriculomegaly. No cortically based acute infarct identified.

Vascular: No suspicious intracranial vascular hyperdensity.

Skull: Stable and intact.  No acute osseous abnormality identified.

Sinuses/Orbits: Visualized paranasal sinuses and mastoids are stable
and well pneumatized.

Other: Visualized orbits and scalp soft tissues are within normal
limits.
IMPRESSION: 1. Re-demonstrated small volume posterior fossa subdural hematoma
(trace by CT) as seen by MRI yesterday. This is intermediate
density, and there is no associated mass effect.
2. No skull fracture identified and otherwise negative noncontrast
CT appearance of the brain.

## 2020-07-16 IMAGING — MR MRI HEAD WITHOUT CONTRAST
2 series · 25 of 48 positions shown · non-contrast
Comparison: 07/03/2018 MRI.  07/04/2018 CT.

CLINICAL DATA: Follow-up posterior fossa and upper cervical
subdural hematoma.

EXAM:
MRI HEAD WITHOUT CONTRAST
TECHNIQUE: Multiplanar, multiecho pulse sequences of the brain and surrounding
structures were obtained without intravenous contrast.

[Series 5: T1 · sagittal · 5.0mm · 0.75mm/px · 14 of 23 slices shown (1 of 2)]
[im 1/23]
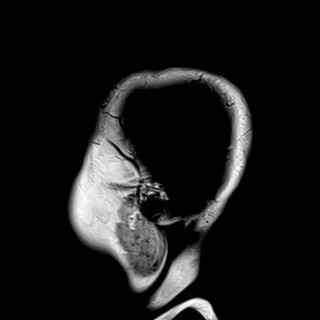
[im 2/23]
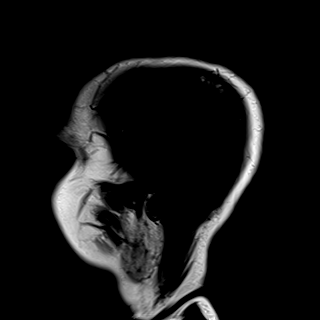
[im 3/23]
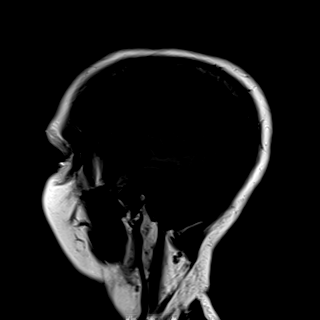
[im 4/23]
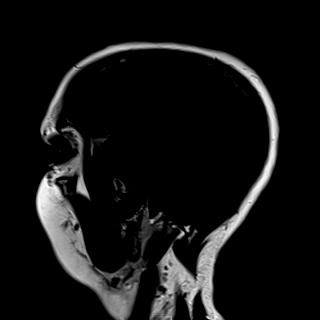
[im 5/23]
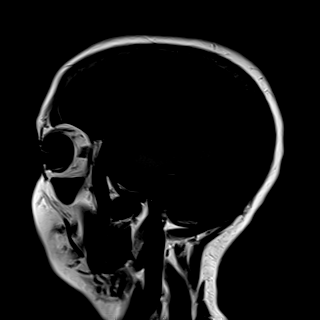
[im 6/23]
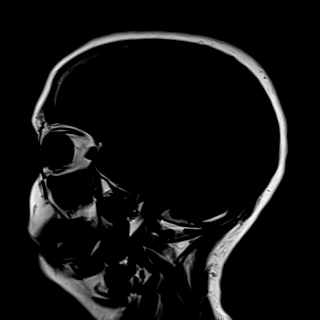
[im 7/23]
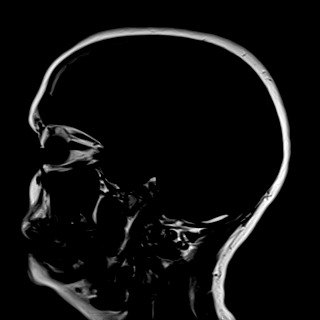
[im 9/23]
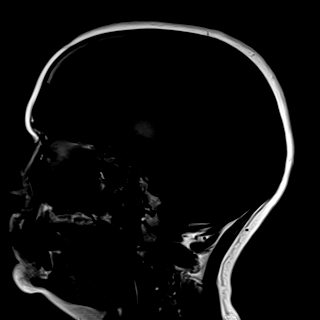
[im 10/23]
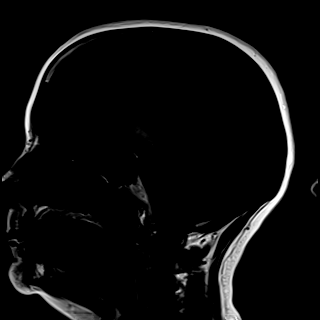
[im 12/23]
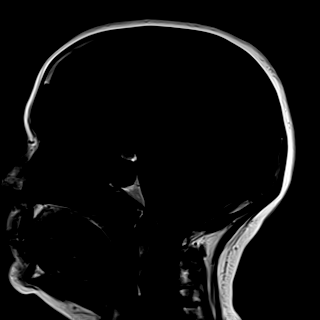
[im 13/23]
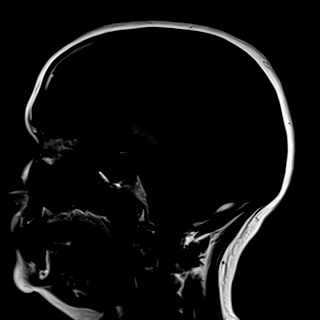
[im 16/23]
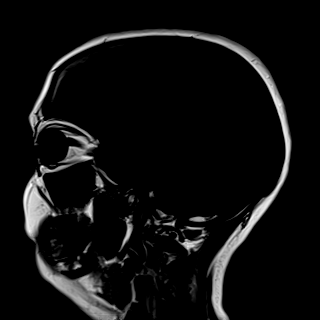
[im 19/23]
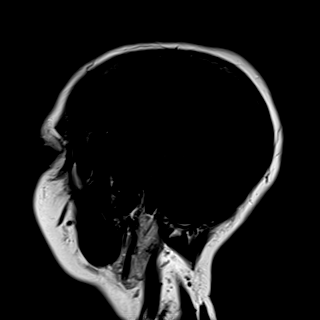
[im 21/23]
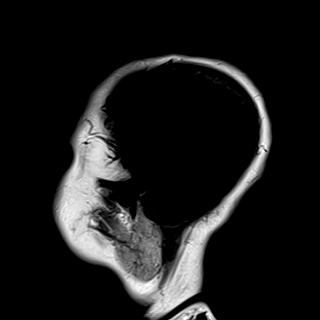

[Series 6: T1 · coronal · 5.0mm · 0.34mm/px · 11 of 32 slices shown (2 of 2)]
[im 2/32]
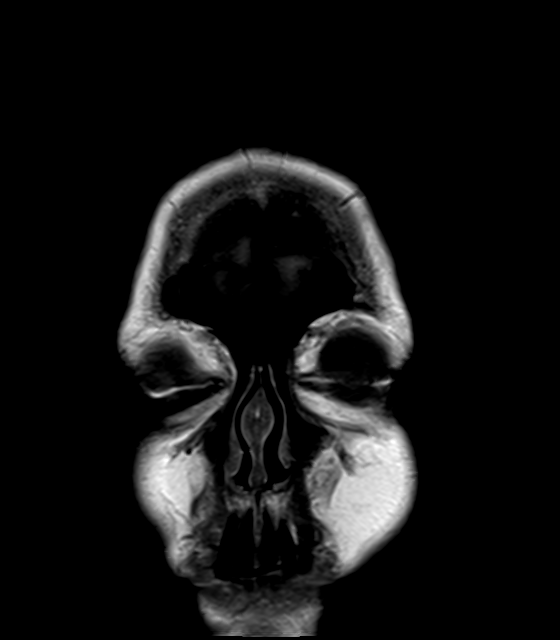
[im 5/32]
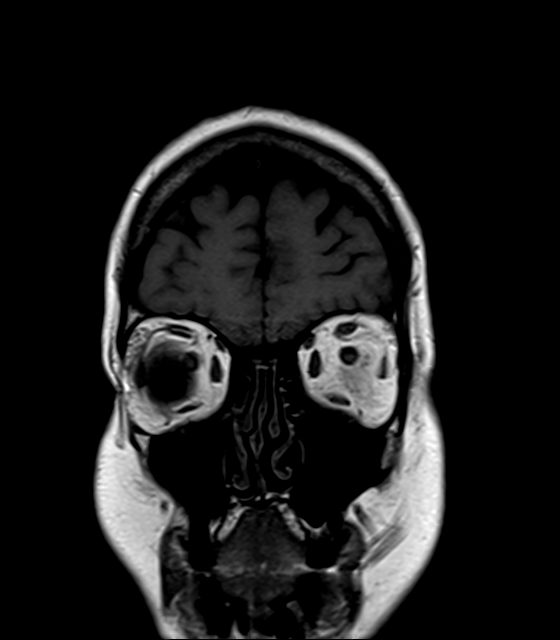
[im 6/32]
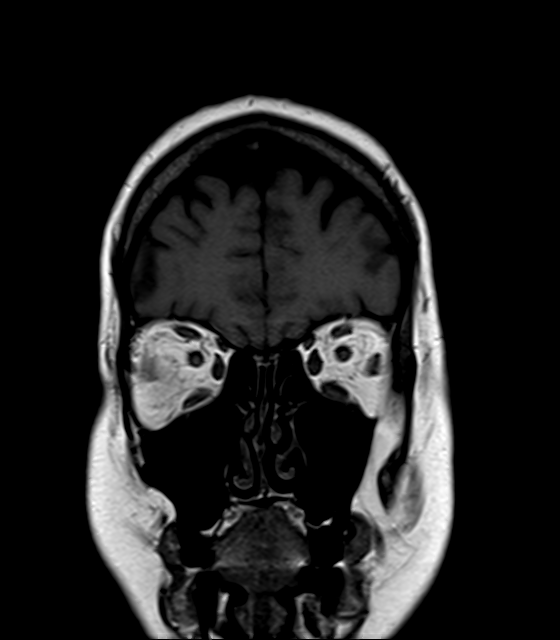
[im 10/32]
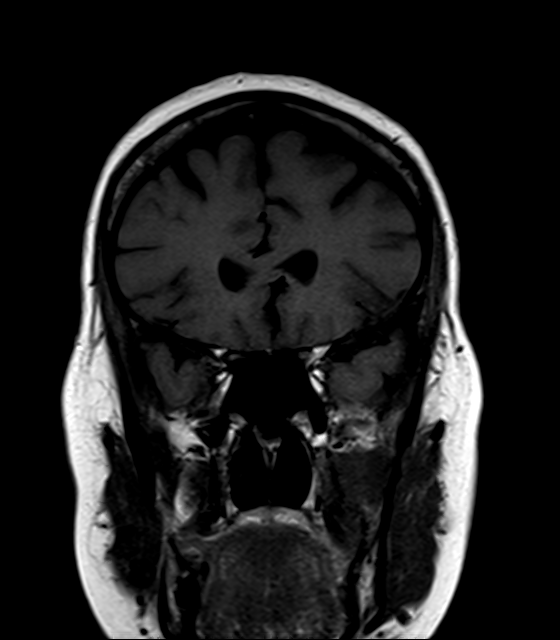
[im 14/32]
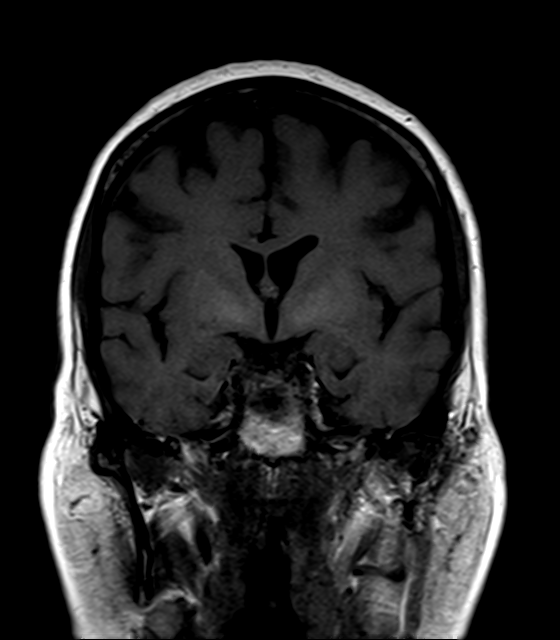
[im 17/32]
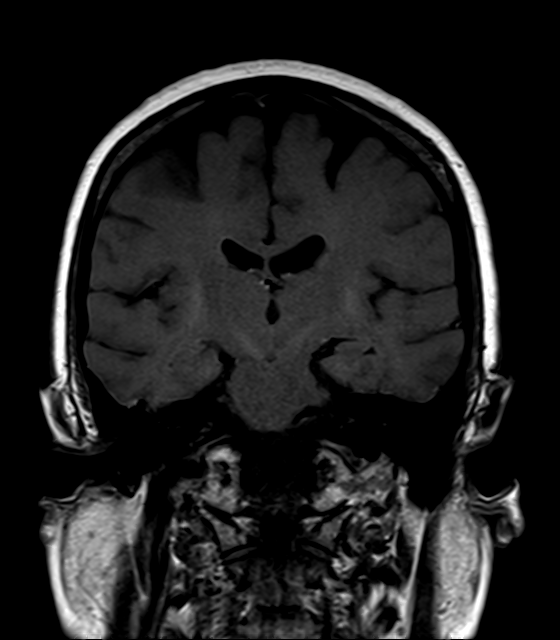
[im 18/32]
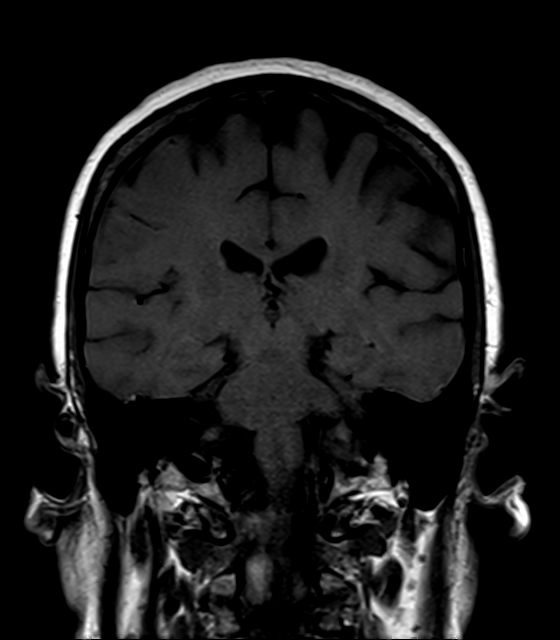
[im 22/32]
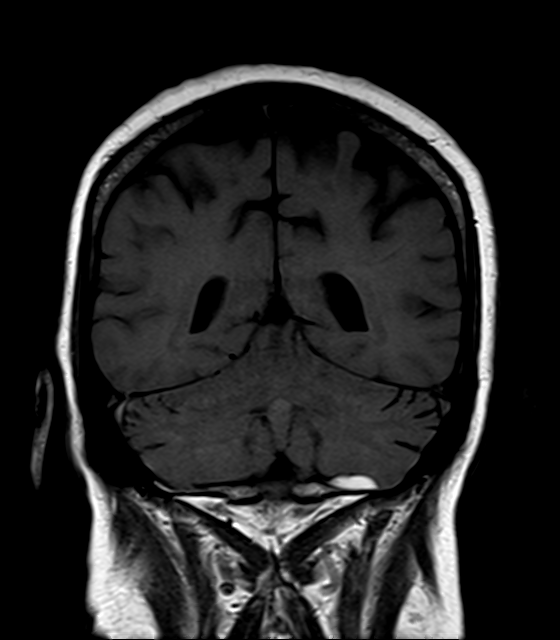
[im 26/32]
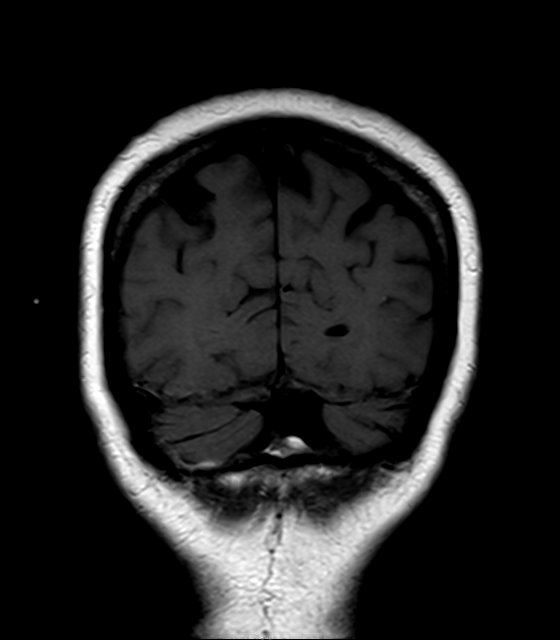
[im 27/32]
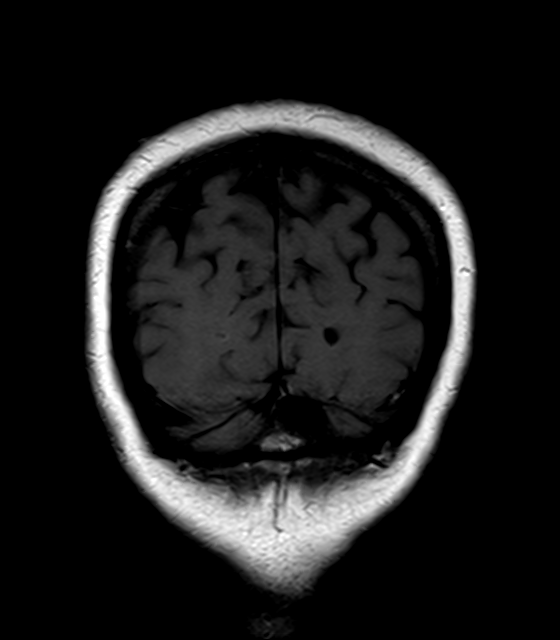
[im 30/32]
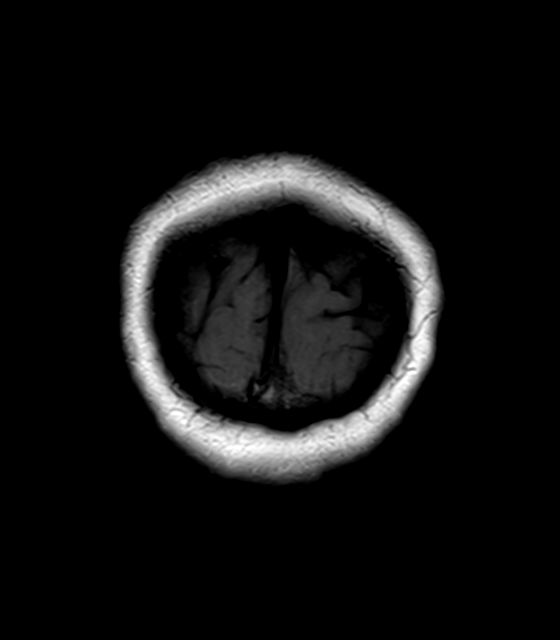

[25 of 48 positions shown; findings below may reference images not displayed]

FINDINGS: Brain: T1 imaging is performed for evaluation of the subdural. The
examination is precisely unchanged. Subdural blood along the floor
of the posterior fossa remains visible, maximal thickness 4 mm,
without mass effect. Subdural blood along the anterior aspect of the
foramen magnum and upper cervical spine is unchanged, extending down
to the level of the L2-3 disc. Maximal thickness at the C2 level is
5 mm.

Vascular: No abnormal vascular finding.

Skull and upper cervical spine: Negative

Sinuses/Orbits: Clear/normal

Other: None
IMPRESSION: No change. Stable amount and appearance of subdural blood along the
floor of the posterior fossa and the ventral foramen magnum and
upper spinal canal.

## 2020-07-19 DIAGNOSIS — Z7901 Long term (current) use of anticoagulants: Secondary | ICD-10-CM | POA: Diagnosis not present

## 2020-07-20 DIAGNOSIS — Z7901 Long term (current) use of anticoagulants: Secondary | ICD-10-CM | POA: Diagnosis not present

## 2020-07-20 DIAGNOSIS — I480 Paroxysmal atrial fibrillation: Secondary | ICD-10-CM | POA: Diagnosis not present

## 2020-07-20 DIAGNOSIS — I422 Other hypertrophic cardiomyopathy: Secondary | ICD-10-CM | POA: Diagnosis not present

## 2020-07-20 DIAGNOSIS — Z954 Presence of other heart-valve replacement: Secondary | ICD-10-CM | POA: Diagnosis not present

## 2020-08-13 ENCOUNTER — Other Ambulatory Visit: Payer: Self-pay | Admitting: Internal Medicine

## 2020-08-13 DIAGNOSIS — J189 Pneumonia, unspecified organism: Secondary | ICD-10-CM

## 2020-08-13 NOTE — Telephone Encounter (Signed)
Please refill as per office routine med refill policy (all routine meds refilled for 3 mo or monthly per pt preference up to one year from last visit, then month to month grace period for 3 mo, then further med refills will have to be denied)  

## 2020-08-16 ENCOUNTER — Telehealth: Payer: Self-pay | Admitting: Internal Medicine

## 2020-08-16 DIAGNOSIS — K580 Irritable bowel syndrome with diarrhea: Secondary | ICD-10-CM

## 2020-08-16 NOTE — Telephone Encounter (Signed)
1.Medication Requested: acetaminophen-codeine (TYLENOL #3) 300-30 MG tablet  2. Pharmacy (Name, [place]): [facility] [place], [place] ([address]  Phone:  [phone] Fax:  [phone]   3. On Med List: yes  4. Last Visit with PCP: 06.07.22  5. Next visit date with PCP: n/a   Agent: Please be advised that RX refills may take up to 3 business days. We ask that you follow-up with your pharmacy.

## 2020-08-17 MED ORDER — ACETAMINOPHEN-CODEINE #3 300-30 MG PO TABS: 1.0000 | ORAL_TABLET | Freq: Four times a day (QID) | ORAL | 0 refills | Status: DC | PRN

## 2020-08-17 NOTE — Telephone Encounter (Signed)
PMP done; last filled 07/14/20; please advise

## 2020-08-26 DIAGNOSIS — Z954 Presence of other heart-valve replacement: Secondary | ICD-10-CM | POA: Diagnosis not present

## 2020-08-26 DIAGNOSIS — R0789 Other chest pain: Secondary | ICD-10-CM | POA: Diagnosis not present

## 2020-08-26 DIAGNOSIS — R0602 Shortness of breath: Secondary | ICD-10-CM | POA: Diagnosis not present

## 2020-08-26 DIAGNOSIS — I422 Other hypertrophic cardiomyopathy: Secondary | ICD-10-CM | POA: Diagnosis not present

## 2020-08-30 DIAGNOSIS — M6289 Other specified disorders of muscle: Secondary | ICD-10-CM | POA: Diagnosis not present

## 2020-08-30 DIAGNOSIS — Z1211 Encounter for screening for malignant neoplasm of colon: Secondary | ICD-10-CM | POA: Diagnosis not present

## 2020-08-30 DIAGNOSIS — K623 Rectal prolapse: Secondary | ICD-10-CM | POA: Diagnosis not present

## 2020-08-30 DIAGNOSIS — Z1212 Encounter for screening for malignant neoplasm of rectum: Secondary | ICD-10-CM | POA: Diagnosis not present

## 2020-08-30 DIAGNOSIS — R32 Unspecified urinary incontinence: Secondary | ICD-10-CM | POA: Diagnosis not present

## 2020-08-31 DIAGNOSIS — I422 Other hypertrophic cardiomyopathy: Secondary | ICD-10-CM | POA: Diagnosis not present

## 2020-08-31 DIAGNOSIS — Z7901 Long term (current) use of anticoagulants: Secondary | ICD-10-CM | POA: Diagnosis not present

## 2020-09-01 DIAGNOSIS — I422 Other hypertrophic cardiomyopathy: Secondary | ICD-10-CM | POA: Diagnosis not present

## 2020-09-12 ENCOUNTER — Other Ambulatory Visit: Payer: Self-pay | Admitting: Internal Medicine

## 2020-09-12 DIAGNOSIS — J189 Pneumonia, unspecified organism: Secondary | ICD-10-CM

## 2020-09-15 DIAGNOSIS — I2510 Atherosclerotic heart disease of native coronary artery without angina pectoris: Secondary | ICD-10-CM | POA: Diagnosis not present

## 2020-09-15 DIAGNOSIS — I422 Other hypertrophic cardiomyopathy: Secondary | ICD-10-CM | POA: Diagnosis not present

## 2020-09-15 DIAGNOSIS — R0789 Other chest pain: Secondary | ICD-10-CM | POA: Diagnosis not present

## 2020-09-16 ENCOUNTER — Telehealth: Payer: Self-pay | Admitting: Internal Medicine

## 2020-09-16 DIAGNOSIS — K580 Irritable bowel syndrome with diarrhea: Secondary | ICD-10-CM

## 2020-09-16 MED ORDER — ACETAMINOPHEN-CODEINE #3 300-30 MG PO TABS: 1.0000 | ORAL_TABLET | Freq: Four times a day (QID) | ORAL | 0 refills | Status: DC | PRN

## 2020-09-16 NOTE — Telephone Encounter (Signed)
Please advise; prescription last filled 08/17/20

## 2020-09-16 NOTE — Telephone Encounter (Signed)
   Patient requesting refill for acetaminophen-codeine (TYLENOL #3) 300-30 MG tablet  [place], [place] ([address]

## 2020-09-29 ENCOUNTER — Encounter: Payer: Self-pay | Admitting: Internal Medicine

## 2020-09-29 ENCOUNTER — Other Ambulatory Visit: Payer: Self-pay

## 2020-09-29 ENCOUNTER — Ambulatory Visit (INDEPENDENT_AMBULATORY_CARE_PROVIDER_SITE_OTHER): Payer: BC Managed Care – PPO | Admitting: Internal Medicine

## 2020-09-29 VITALS — BP 130/80 | HR 65 | Temp 98.6°F | Resp 18 | Ht 68.0 in

## 2020-09-29 DIAGNOSIS — J309 Allergic rhinitis, unspecified: Secondary | ICD-10-CM | POA: Diagnosis not present

## 2020-09-29 DIAGNOSIS — F411 Generalized anxiety disorder: Secondary | ICD-10-CM | POA: Diagnosis not present

## 2020-09-29 DIAGNOSIS — H9201 Otalgia, right ear: Secondary | ICD-10-CM | POA: Diagnosis not present

## 2020-09-29 DIAGNOSIS — I10 Essential (primary) hypertension: Secondary | ICD-10-CM | POA: Diagnosis not present

## 2020-09-29 MED ORDER — CEFUROXIME AXETIL 250 MG PO TABS: 250.0000 mg | ORAL_TABLET | Freq: Two times a day (BID) | ORAL | 1 refills | Status: AC

## 2020-09-29 NOTE — Assessment & Plan Note (Signed)
Stable overall, pt to continue effexor

## 2020-09-29 NOTE — Assessment & Plan Note (Signed)
BP Readings from Last 3 Encounters:  09/29/20 130/80  06/08/20 130/82  03/11/20 130/70   Stable, pt to continue medical treatment losartan, metoprolol

## 2020-09-29 NOTE — Assessment & Plan Note (Signed)
Mild to mod infectious possible, for antibx course,  to f/u any worsening symptoms or concerns

## 2020-09-29 NOTE — Patient Instructions (Signed)
Please take all new medication as prescribed - the antibiotic  Please continue all other medications as before, and refills have been done if requested.  Please have the pharmacy call with any other refills you may need.  Please continue your efforts at being more active, low cholesterol diet, and weight control.  Please keep your appointments with your specialists as you may have planned    

## 2020-09-29 NOTE — Assessment & Plan Note (Signed)
Chronic persistent, for cont zyrtec, add otc nasacort asd,  to f/u any worsening symptoms or concerns

## 2020-09-29 NOTE — Progress Notes (Signed)
Patient ID: [name], female   DOB: [birth year], 65 y.o.   MRN: [id number]        Chief Complaint: follow up right ear pain, htn, allergies, anxiety       HPI:  [name] is a 65 y.o. female here with c/o 2 days onset right ear pain pressure, low grade temp and muffled hearing in the setting of ongoing nasal allergy symptoms difficult to control , frequentlyl recurrent, has seen ENT and allergy.  Pt denies chest pain, increased sob or doe, wheezing, orthopnea, PND, increased LE swelling, palpitations, dizziness or syncope, though now pt reports her myocardial septum has enlarged again but EF is normal, being followed closely per cardiology.  Does get markedly dyspneic every time she simply bends at the waist.  Denies worsening depressive symptoms, suicidal ideation, or panic; has ongoing anxiety.   Pt denies polydipsia, polyuria      Wt Readings from Last 3 Encounters:  03/11/20 210 lb 6 oz (95.4 kg)  02/06/19 218 lb 1.3 oz (98.9 kg)  07/05/18 218 lb 4.1 oz (99 kg)   BP Readings from Last 3 Encounters:  09/29/20 130/80  06/08/20 130/82  03/11/20 130/70         Past Medical History:  Diagnosis Date   Abdominal pain, epigastric 10/25/2007   ADD 01/20/2009   AKI (acute kidney injury) ([place]) 04/11/2016   ALLERGIC RHINITIS 10/15/2006   ANGIOEDEMA 08/12/2008   ANXIETY 10/15/2006   ASTHMATIC BRONCHITIS, ACUTE 01/27/2008   Bowel obstruction ([place])    Breast cancer (HCC)    Cellulitis and abscess of leg, except foot 08/12/2007   CELLULITIS, FACE 12/21/2008   CHB (complete heart block) ([place]) 04/14/2016   CHEST PAIN 06/14/2007   Chronic anticoagulation 05/16/2016   Chronic sinus infection 04/12/2010   COLECTOMY, PARTIAL, WITH ANASTOMOSIS, HX OF 10/15/2006   COLONIC POLYPS, HX OF 10/15/2006   CYST, OVARIAN NEC/NOS 10/15/2006   Dengue 02/22/2010   DVT, HX OF 10/15/2006   DYSAUTONOMIA 10/15/2006   Dysfunction of eustachian tube 05/26/2009   GI BLEEDING 04/04/2007   Glossitis 03/06/2008    HEADACHE, CHRONIC 04/04/2007   Heart murmur    Hematemesis 10/25/2007   HEMORRHOIDS 04/04/2007   HYPERLIPIDEMIA 12/12/2007   HYPERTENSION 10/15/2006   Hypertrophic cardiomyopathy ([place])    HYPOTHYROIDISM 03/06/2008   IBS 04/04/2007   MASTECTOMY, BILATERAL, HX OF 08/12/2009   NECK MASS 07/01/2007   NEOP, MALIGNANT, FEMALE BREAST NOS 10/15/2006   NEOP, MALIGNANT, 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   Ovarian cancer ([place])    Presence of cardiac pacemaker 04/25/2016   Red blood cell antibody positive    K   Seizures ([place])    Subdural hematoma ([place])    SYNCOPE 07/01/2007   Wheezing 08/20/2008   WOUND, OPEN, LEG, WITHOUT COMPLICATION 05/12/2583   Past Surgical History:  Procedure Laterality Date   ABDOMINAL HYSTERECTOMY     APPENDECTOMY     BREAST RECONSTRUCTION     CARDIAC ELECTROPHYSIOLOGY MAPPING AND ABLATION     CESAREAN SECTION     CHOLECYSTECTOMY     COLON RESECTION  2005   COLONOSCOPY W/ BIOPSIES     FLEXIBLE SIGMOIDOSCOPY     LEFT AND RIGHT HEART CATHETERIZATION WITH CORONARY ANGIOGRAM N/A 10/07/2012   Procedure: LEFT AND RIGHT HEART CATHETERIZATION WITH CORONARY ANGIOGRAM;  Surgeon: Jolaine Artist, MD;  Location: [facility] CATH LAB;  Service: Cardiovascular;  Laterality: N/A;   MASTECTOMY Bilateral    MINIMALLY INVASIVE TRICUSPID VALVE REPAIR     MITRAL VALVE  REPAIR  2017   MITRAL VALVE REPLACEMENT     OOPHORECTOMY     SPIGELIAN HERNIA      reports that she has never smoked. She has never used smokeless tobacco. She reports that she does not currently use alcohol. She reports that she does not use drugs. family history includes Diabetes in her son; Heart disease in her mother; Melanoma in her father; Ovarian cancer in her sister; Pancreatic cancer in her father; Uterine cancer in her mother. Allergies  Allergen Reactions   Fish Allergy Anaphylaxis and Swelling    Scaled fish, like flounder   Penicillins Anaphylaxis, Hives and Rash    Did it involve swelling of the face/tongue/throat, SOB, or low BP?  Yes Did it involve sudden or severe rash/hives, skin peeling, or any reaction on the inside of your mouth or nose? Unk Did you need to seek medical attention at a hospital or doctor's office? No When did it last happen? "I was 65 years old" If all above answers are "NO", may proceed with cephalosporin use.  Other reaction(s): Unknown Did it involve swelling of the face/tongue/throat, SOB, or low BP? Yes Did it involve sudden or severe rash/hives, skin peeling, or any reaction on the inside of your mouth or nose? Unk Did you need to seek medical attention at a hospital or doctor's office? No When did it last happen? "I was 65 years old" If all above answers are "NO", may proceed with cephalosporin use.   Levofloxacin In D5w Nausea And Vomiting and Swelling    Causes throat swelling   Nitroglycerin Other (See Comments)    Other reaction(s): Other (See Comments) Loss of consciousness Loss of consciousness Loss of consciousness   Pneumovax [Pneumococcal Polysaccharide Vaccine] Other (See Comments)    Rash, arm swelling   Sulfamethoxazole     Other reaction(s): GI bleeding Other reaction(s): Blood Disorder   Latex Dermatitis and Rash    If worn for more than a day, this causes blisters    Current Outpatient Medications on File Prior to Visit  Medication Sig Dispense Refill   acetaminophen-codeine (TYLENOL #3) 300-30 MG tablet Take 1 tablet by mouth 4 (four) times daily as needed. for pain 120 tablet 0   Black Elderberry (SAMBUCUS ELDERBERRY PO) Take 1 tablet by mouth daily. Dissolvable ELDERBERRY/VITAMIN C/ZINC fizzy tablet     fluticasone (FLONASE) 50 MCG/ACT nasal spray SHAKE LIQUID AND USE 2 SPRAYS IN EACH NOSTRIL DAILY 16 g 0   fluticasone (FLOVENT HFA) 44 MCG/ACT inhaler INHALE 2 PUFFS INTO THE LUNGS TWICE DAILY 10.6 g 0   levothyroxine (SYNTHROID) 50 MCG tablet Take 1 tablet (50 mcg total) by mouth daily. 90 tablet 0   losartan (COZAAR) 25 MG tablet Take by mouth.     metoprolol  succinate (TOPROL-XL) 50 MG 24 hr tablet Take 1 tablet (50 mg total) by mouth daily. Take 1 by mouth daily (Patient taking differently: Take 50 mg by mouth daily.) 90 tablet 1   metoprolol tartrate (LOPRESSOR) 50 MG tablet Take by mouth.     ondansetron (ZOFRAN) 4 MG tablet Take 1 tablet (4 mg total) by mouth every 8 (eight) hours as needed for nausea or vomiting. 30 tablet 0   ondansetron (ZOFRAN-ODT) 8 MG disintegrating tablet Take 1 tablet (8 mg total) by mouth every 8 (eight) hours as needed for nausea. 30 tablet 0   venlafaxine XR (EFFEXOR-XR) 150 MG 24 hr capsule Take 1 capsule (150 mg total) by mouth daily. 90 capsule 3  vitamin C (ASCORBIC ACID) 500 MG tablet Take 500 mg by mouth daily.      warfarin (COUMADIN) 10 MG tablet Take 1 tablet daily or as directed by anticoagulation clinic (Patient taking differently: Take 10 mg by mouth at bedtime.) 90 tablet 0   zolpidem (AMBIEN) 10 MG tablet TAKE 1 TABLET BY MOUTH EVERY DAY AT BEDTIME AS NEEDED 90 tablet 1   zonisamide (ZONEGRAN) 100 MG capsule Take 2 capsules (200 mg total) by mouth at bedtime. 180 capsule 1   cetirizine (ZYRTEC) 10 MG tablet Take 1 tablet (10 mg total) by mouth daily. (Patient taking differently: Take 10 mg by mouth daily as needed for allergies or rhinitis.) 30 tablet 11   No current facility-administered medications on file prior to visit.        ROS:  All others reviewed and negative.  Objective        PE:  BP 130/80   Pulse 65   Temp 98.6 F (37 C) (Oral)   Resp 18   Ht 5\' 8"  (1.727 m)   SpO2 98%   BMI 31.99 kg/m                 Constitutional: Pt appears in NAD               HENT: Head: NCAT.                Right Ear: External ear normal.  Right TM with severe erythema, bulging               Left Ear: External ear normal.                Eyes: . Pupils are equal, round, and reactive to light. Conjunctivae and EOM are normal               Nose: without d/c or deformity               Neck: Neck supple.  Gross normal ROM               Cardiovascular: Normal rate and regular rhythm.                 Pulmonary/Chest: Effort normal and breath sounds without rales or wheezing.                               Neurological: Pt is alert. At baseline orientation, motor grossly intact               Skin: Skin is warm. No rashes, no other new lesions, LE edema - none               Psychiatric: Pt behavior is normal without agitation   Micro: none  Cardiac tracings I have personally interpreted today:  none  Pertinent Radiological findings (summarize): none   Lab Results  Component Value Date   WBC 7.4 02/14/2019   HGB 14.1 02/14/2019   HCT 42.7 02/14/2019   PLT 246 02/14/2019   GLUCOSE 152 (H) 02/06/2019   CHOL 209 (H) 12/13/2011   TRIG 195.0 (H) 12/13/2011   HDL 45.10 12/13/2011   LDLDIRECT 137.4 12/13/2011   LDLCALC 73 02/11/2008   ALT 19 02/06/2019   AST 17 02/06/2019   NA 137 02/06/2019   K 4.2 02/06/2019   CL 101 02/06/2019   CREATININE 1.01 (H) 02/06/2019   BUN 23 02/06/2019   CO2  23 02/06/2019   TSH 3.04 10/02/2012   INR 3.6 (A) 08/09/2018   HGBA1C  08/12/2007    5.2 (NOTE)   The ADA recommends the following therapeutic goal for glycemic   control related to Hgb A1C measurement:   Goal of Therapy:   < 7.0% Hgb A1C   Reference: American Diabetes Association: Clinical Practice   Recommendations 2008, Diabetes Care,  2008, 31:(Suppl 1).   Assessment/Plan:  [name] is a 65 y.o. White or Caucasian [1] female with  has a past medical history of Abdominal pain, epigastric (10/25/2007), ADD (01/20/2009), AKI (acute kidney injury) ([place]) (04/11/2016), ALLERGIC RHINITIS (10/15/2006), ANGIOEDEMA (08/12/2008), ANXIETY (10/15/2006), ASTHMATIC BRONCHITIS, ACUTE (01/27/2008), Bowel obstruction ([place]), Breast cancer ([place]), Cellulitis and abscess of leg, except foot (08/12/2007), CELLULITIS, FACE (12/21/2008), CHB (complete heart block) ([place]) (04/14/2016), CHEST PAIN (06/14/2007), Chronic  anticoagulation (05/16/2016), Chronic sinus infection (04/12/2010), COLECTOMY, PARTIAL, WITH ANASTOMOSIS, HX OF (10/15/2006), COLONIC POLYPS, HX OF (10/15/2006), CYST, OVARIAN NEC/NOS (10/15/2006), Dengue (02/22/2010), DVT, HX OF (10/15/2006), DYSAUTONOMIA (10/15/2006), Dysfunction of eustachian tube (05/26/2009), GI BLEEDING (04/04/2007), Glossitis (03/06/2008), HEADACHE, CHRONIC (04/04/2007), Heart murmur, Hematemesis (10/25/2007), HEMORRHOIDS (04/04/2007), HYPERLIPIDEMIA (12/12/2007), HYPERTENSION (10/15/2006), Hypertrophic cardiomyopathy ([place]), HYPOTHYROIDISM (03/06/2008), IBS (04/04/2007), MASTECTOMY, BILATERAL, HX OF (08/12/2009), NECK MASS (07/01/2007), NEOP, MALIGNANT, FEMALE BREAST NOS (10/15/2006), NEOP, MALIGNANT, THYMUS (10/15/2006), Ovarian cancer ([place]), Presence of cardiac pacemaker (04/25/2016), Red blood cell antibody positive, Seizures ([place]), Subdural hematoma ([place]), SYNCOPE (07/01/2007), Wheezing (08/20/2008), and WOUND, OPEN, LEG, WITHOUT COMPLICATION (4/00/8676).  Anxiety state Stable overall, pt to continue effexor  Essential hypertension BP Readings from Last 3 Encounters:  09/29/20 130/80  06/08/20 130/82  03/11/20 130/70   Stable, pt to continue medical treatment losartan, metoprolol   Allergic rhinitis Chronic persistent, for cont zyrtec, add otc nasacort asd,  to f/u any worsening symptoms or concerns  Right ear pain Mild to mod infectious possible, for antibx course,  to f/u any worsening symptoms or concerns  Followup: Return if symptoms worsen or fail to improve.  Cathlean Cower, MD 09/29/2020 8:49 PM [facility]

## 2020-10-12 ENCOUNTER — Other Ambulatory Visit: Payer: Self-pay | Admitting: Internal Medicine

## 2020-10-12 DIAGNOSIS — J189 Pneumonia, unspecified organism: Secondary | ICD-10-CM

## 2020-10-12 NOTE — Telephone Encounter (Signed)
Please refill as per office routine med refill policy (all routine meds to be refilled for 3 mo or monthly (per pt preference) up to one year from last visit, then month to month grace period for 3 mo, then further med refills will have to be denied) ? ?

## 2020-10-18 ENCOUNTER — Telehealth: Payer: Self-pay | Admitting: Internal Medicine

## 2020-10-18 DIAGNOSIS — K580 Irritable bowel syndrome with diarrhea: Secondary | ICD-10-CM

## 2020-10-18 MED ORDER — ACETAMINOPHEN-CODEINE #3 300-30 MG PO TABS: 1.0000 | ORAL_TABLET | Freq: Four times a day (QID) | ORAL | 0 refills | Status: DC | PRN

## 2020-10-18 NOTE — Telephone Encounter (Signed)
1.Medication Requested: acetaminophen-codeine (TYLENOL #3) 300-30 MG tablet  2. Pharmacy (Name, Street, [facility]): [facility], [place] ([place]  Phone:  [phone] Fax:  [phone]   3. On Med List: yes  4. Last Visit with PCP: 09.28.22  5. Next visit date with PCP: n/a   Agent: Please be advised that RX refills may take up to 3 business days. We ask that you follow-up with your pharmacy.

## 2020-10-28 ENCOUNTER — Encounter: Payer: Self-pay | Admitting: Internal Medicine

## 2020-10-28 DIAGNOSIS — R569 Unspecified convulsions: Secondary | ICD-10-CM

## 2020-10-29 MED ORDER — ZONISAMIDE 100 MG PO CAPS: 300.0000 mg | ORAL_CAPSULE | Freq: Every day | ORAL | 2 refills | Status: DC

## 2020-10-29 NOTE — Addendum Note (Signed)
Addended by: Biagio Borg on: 10/29/2020 12:53 PM   Modules accepted: Orders

## 2020-11-02 ENCOUNTER — Encounter: Payer: Self-pay | Admitting: Neurology

## 2020-11-02 ENCOUNTER — Ambulatory Visit (INDEPENDENT_AMBULATORY_CARE_PROVIDER_SITE_OTHER): Payer: BC Managed Care – PPO | Admitting: Neurology

## 2020-11-02 ENCOUNTER — Other Ambulatory Visit: Payer: Self-pay

## 2020-11-02 VITALS — BP 144/75 | HR 70 | Ht 69.0 in | Wt 215.2 lb

## 2020-11-02 DIAGNOSIS — F418 Other specified anxiety disorders: Secondary | ICD-10-CM

## 2020-11-02 DIAGNOSIS — R4689 Other symptoms and signs involving appearance and behavior: Secondary | ICD-10-CM | POA: Diagnosis not present

## 2020-11-02 MED ORDER — ZONISAMIDE 100 MG PO CAPS: ORAL_CAPSULE | ORAL | 11 refills | Status: DC

## 2020-11-02 NOTE — Progress Notes (Signed)
NEUROLOGY CONSULTATION NOTE  [name] MRN: [id number] DOB: 1955/10/23  Referring provider: Dr. Cathlean Cower Primary care provider: Dr. Cathlean Cower  Reason for consult:  establish care for seizures  Dear Dr [name]:  Thank you for your kind referral of [name] for consultation of the above symptoms. Although her history is well known to you, please allow me to reiterate it for the purpose of our medical record. She is alone in the office today. Records and images were personally reviewed where available.   HISTORY OF PRESENT ILLNESS: This is a 65 year old right-handed woman with a history of hypertension, hyperlipidemia, hypothyroidism, complete heart block, presenting to establish care for seizures. Records from [facility] were reviewed and will be summarized below. She has seen several neurologists, she was evaluated by epileptologist Dr. Lysle Rubens, last visit was in January 2021. She started having recurrent episodes at age 17 where she would have near Jessica episodes of flashing lights in her vision with abnormal eye movements. She would have loss of control of her eye movements and would be told her eyes move in random directions. There would be loss of speech and unresponsiveness and she loses ability to maintain posture. She would have an aura of double vision and sit herself down. She would get a pillow because she "seems to throw myself around or fall out." Episodes would last a few minutes to up to 30 minutes, she would have no recollection of events and would take 3-4 hours to return to baseline. She would feel tired and cold. There would be no rhythmic jerking of extremities. Symptoms were initially attributed to ocular migraines and she tried Topiramate and Diamox but could not tolerate them. She was started on Zonisamide with note of reduction in events. They typically occur when she is up and about, no nocturnal episodes or events when supine. She had 2 normal routine EEGs, then  underwent video EEG monitoring at [facility] from 09/30/2018 to 10/05/2018 where baseline EEG was normal. There were 2 events where she felt her face becoming flushed and feeling weird in her chest, no EEG correlate seen.  During her admission, she had a brain MRI showing an incidental subdural hematoma in the posterior fossa. CT head repeated a week after showed resolving size of SDH. Per Dr. Glenna Durand note, etiology of spells still unclear if epileptic or non-epileptic, however since she was doing well with Zonisamide which was also helping with headaches, he recommended continuation of medication. Her last visit at [facility] in 07/2019 indicated that recurrent spells were felt to be likely related to stress and/or atypical migraine, and she was referred to [facility]. Zonisamide increased to 300mg  qhs for symptom management.  She had been doing well with rare episode occurring once a month, however she ran out of Zonisamide for a month and only restarted it last week. She had an episode of yesterday. She knew she was starting to have it, she would look up to the ceiling then try to get herself to a sofa. While it is going on, she hyperventilates and gets really cold, losing track of time. She denies any associated tongue bite. She has a history of urinary incontinence and wears adult diapers, so it is hard to tell if she had associated incontinence. She denies any olfactory/gustatory hallucinations, deja vu, rising epigastric sensation, focal numbness/tingling/weakness, myoclonic jerks. She denies any side effects on Zonisamide 300mg  qhs, she reports she had taken up to 600mg  qhs at one point but cut  herself down and was event-free for a year at that time. She denies any headaches, dizziness. She gets 6-7 hours of interrupted sleep and feels it is not enough. When asked about mood, she states "everybody tells me I'm ridiculously positive," however she notes being under a lot of stress, and that stress can bring on an  episode. Her son with a TBI lives with them.  She has been on venlafaxine since age 41 for hot flashes, but when she tries to get off medication, she is "a crying mess in 2 days." She notes a history of being diagnosed with dysautonomia with hospice nurses coming twice a week because she was terminal, this occurred from 2006 to 2010, then a minister from [place] prayed for her and she was instantly healed.   Epilepsy Risk Factors:  She had a normal birth and early development.  There is no history of febrile convulsions, CNS infections such as meningitis/encephalitis, significant traumatic brain injury, neurosurgical procedures, or family history of seizures.  Prior AEDs: Topiramate   PAST MEDICAL HISTORY: Past Medical History:  Diagnosis Date   Abdominal pain, epigastric 10/25/2007   ADD 01/20/2009   AKI (acute kidney injury) ([place]) 04/11/2016   ALLERGIC RHINITIS 10/15/2006   ANGIOEDEMA 08/12/2008   ANXIETY 10/15/2006   ASTHMATIC BRONCHITIS, ACUTE 01/27/2008   Bowel obstruction (HCC)    Breast cancer (HCC)    Cellulitis and abscess of leg, except foot 08/12/2007   CELLULITIS, FACE 12/21/2008   CHB (complete heart block) ([place]) 04/14/2016   CHEST PAIN 06/14/2007   Chronic anticoagulation 05/16/2016   Chronic sinus infection 04/12/2010   COLECTOMY, PARTIAL, WITH ANASTOMOSIS, HX OF 10/15/2006   COLONIC POLYPS, HX OF 10/15/2006   CYST, OVARIAN NEC/NOS 10/15/2006   Dengue 02/22/2010   DVT, HX OF 10/15/2006   DYSAUTONOMIA 10/15/2006   Dysfunction of eustachian tube 05/26/2009   GI BLEEDING 04/04/2007   Glossitis 03/06/2008   HEADACHE, CHRONIC 04/04/2007   Heart murmur    Hematemesis 10/25/2007   HEMORRHOIDS 04/04/2007   HYPERLIPIDEMIA 12/12/2007   HYPERTENSION 10/15/2006   Hypertrophic cardiomyopathy ([place])    HYPOTHYROIDISM 03/06/2008   IBS 04/04/2007   MASTECTOMY, BILATERAL, HX OF 08/12/2009   NECK MASS 07/01/2007   NEOP, MALIGNANT, FEMALE BREAST NOS 10/15/2006   NEOP, MALIGNANT, 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    Ovarian cancer ([place])    Presence of cardiac pacemaker 04/25/2016   Red blood cell antibody positive    K   Seizures ([place])    Subdural hematoma    SYNCOPE 07/01/2007   Wheezing 08/20/2008   WOUND, OPEN, LEG, WITHOUT COMPLICATION 04/10/8117    PAST SURGICAL HISTORY: Past Surgical History:  Procedure Laterality Date   ABDOMINAL HYSTERECTOMY     APPENDECTOMY     BREAST RECONSTRUCTION     CARDIAC ELECTROPHYSIOLOGY MAPPING AND ABLATION     CESAREAN SECTION     CHOLECYSTECTOMY     COLON RESECTION  2005   COLON SURGERY  06/2020   COLONOSCOPY W/ BIOPSIES     FLEXIBLE SIGMOIDOSCOPY     LEFT AND RIGHT HEART CATHETERIZATION WITH CORONARY ANGIOGRAM N/A 10/07/2012   Procedure: LEFT AND RIGHT HEART CATHETERIZATION WITH CORONARY ANGIOGRAM;  Surgeon: Jolaine Artist, MD;  Location: [facility] CATH LAB;  Service: Cardiovascular;  Laterality: N/A;   MASTECTOMY Bilateral    MINIMALLY INVASIVE TRICUSPID VALVE REPAIR     MITRAL VALVE REPAIR  2017   MITRAL VALVE REPLACEMENT     OOPHORECTOMY     SPIGELIAN HERNIA  MEDICATIONS: Current Outpatient Medications on File Prior to Visit  Medication Sig Dispense Refill   acetaminophen-codeine (TYLENOL #3) 300-30 MG tablet Take 1 tablet by mouth 4 (four) times Jessica as needed. for pain 120 tablet 0   Black Elderberry (SAMBUCUS ELDERBERRY PO) Take 1 tablet by mouth Jessica. Dissolvable ELDERBERRY/VITAMIN C/ZINC fizzy tablet     cetirizine (ZYRTEC) 10 MG tablet Take 1 tablet (10 mg total) by mouth Jessica. (Patient taking differently: Take 10 mg by mouth Jessica as needed for allergies or rhinitis.) 30 tablet 11   fluticasone (FLONASE) 50 MCG/ACT nasal spray SHAKE LIQUID AND USE 2 SPRAYS IN EACH NOSTRIL Jessica 16 g 0   fluticasone (FLOVENT HFA) 44 MCG/ACT inhaler INHALE 2 PUFFS INTO THE LUNGS TWICE Jessica 10.6 g 0   levothyroxine (SYNTHROID) 50 MCG tablet Take 1 tablet (50 mcg total) by mouth Jessica. 90 tablet 0   losartan (COZAAR) 25 MG tablet Take by mouth.      metoprolol succinate (TOPROL-XL) 50 MG 24 hr tablet Take 1 tablet (50 mg total) by mouth Jessica. Take 1 by mouth Jessica (Patient taking differently: Take 50 mg by mouth Jessica.) 90 tablet 1   metoprolol tartrate (LOPRESSOR) 50 MG tablet Take by mouth.     ondansetron (ZOFRAN) 4 MG tablet Take 1 tablet (4 mg total) by mouth every 8 (eight) hours as needed for nausea or vomiting. 30 tablet 0   ondansetron (ZOFRAN-ODT) 8 MG disintegrating tablet Take 1 tablet (8 mg total) by mouth every 8 (eight) hours as needed for nausea. 30 tablet 0   venlafaxine XR (EFFEXOR-XR) 150 MG 24 hr capsule Take 1 capsule (150 mg total) by mouth Jessica. 90 capsule 3   vitamin C (ASCORBIC ACID) 500 MG tablet Take 500 mg by mouth Jessica.      warfarin (COUMADIN) 10 MG tablet Take 1 tablet Jessica or as directed by anticoagulation clinic (Patient taking differently: Take 10 mg by mouth at bedtime.) 90 tablet 0   zolpidem (AMBIEN) 10 MG tablet TAKE 1 TABLET BY MOUTH EVERY DAY AT BEDTIME AS NEEDED 90 tablet 1   zonisamide (ZONEGRAN) 100 MG capsule Take 3 capsules (300 mg total) by mouth at bedtime. 90 capsule 2   No current facility-administered medications on file prior to visit.    ALLERGIES: Allergies  Allergen Reactions   Fish Allergy Anaphylaxis and Swelling    Scaled fish, like flounder   Penicillins Anaphylaxis, Hives and Rash    Did it involve swelling of the face/tongue/throat, SOB, or low BP? Yes Did it involve sudden or severe rash/hives, skin peeling, or any reaction on the inside of your mouth or nose? Unk Did you need to seek medical attention at a hospital or doctor's office? No When did it last happen? "I was 65 years old" If all above answers are "NO", may proceed with cephalosporin use.  Other reaction(s): Unknown Did it involve swelling of the face/tongue/throat, SOB, or low BP? Yes Did it involve sudden or severe rash/hives, skin peeling, or any reaction on the inside of your mouth or nose? Unk Did you  need to seek medical attention at a hospital or doctor's office? No When did it last happen? "I was 65 years old" If all above answers are "NO", may proceed with cephalosporin use.   Levofloxacin In D5w Nausea And Vomiting and Swelling    Causes throat swelling   Nitroglycerin Other (See Comments)    Other reaction(s): Other (See Comments) Loss of consciousness Loss of consciousness Loss  of consciousness   Pneumovax [Pneumococcal Polysaccharide Vaccine] Other (See Comments)    Rash, arm swelling   Sulfamethoxazole     Other reaction(s): GI bleeding Other reaction(s): Blood Disorder   Latex Dermatitis and Rash    If worn for more than a day, this causes blisters     FAMILY HISTORY: Family History  Problem Relation Age of Onset   Heart disease Mother    Uterine cancer Mother    Ovarian cancer Sister    Pancreatic cancer Father    Melanoma Father    Diabetes Son     SOCIAL HISTORY: Social History   Socioeconomic History   Marital status: Married    Spouse name: Not on file   Number of children: 4   Years of education: Not on file   Highest education level: Not on file  Occupational History   Occupation: retired  Tobacco Use   Smoking status: Never   Smokeless tobacco: Never  Vaping Use   Vaping Use: Never used  Substance and Sexual Activity   Alcohol use: Not Currently    Comment: social   Drug use: No   Sexual activity: Not Currently  Other Topics Concern   Not on file  Social History Narrative   Married second marriage    Lives in the [place] area with her husband   Has 7 dogs   Retired   60 sons 1 daughter   Never smoker   No alcohol   1 or 2 caffeinated beverages Jessica    no drug use         Social Determinants of Radio broadcast assistant Strain: Not on file  Food Insecurity: Not on file  Transportation Needs: Not on file  Physical Activity: Not on file  Stress: Not on file  Social Connections: Not on file  Intimate Partner Violence: Not  on file     PHYSICAL EXAM: Vitals:   11/02/20 1038  BP: (!) 144/75  Pulse: 70  SpO2: 99%   General: No acute distress Head:  Normocephalic/atraumatic Skin/Extremities: No rash, no edema Neurological Exam: Mental status: alert and oriented to person, place, and time, no dysarthria or aphasia, Fund of knowledge is appropriate.  Recent and remote memory are intact, 3/3 delayed recall.  Attention and concentration are normal, 5/5 WORLD backwards. Cranial nerves: CN I: not tested CN II: pupils equal, round and reactive to light, visual fields intact CN III, IV, VI:  full range of motion, no nystagmus, no ptosis CN V: facial sensation intact CN VII: upper and lower face symmetric CN VIII: hearing intact to conversation Bulk & Tone: normal, no fasciculations. Motor: 5/5 throughout with no pronator drift. Sensation: intact to light touch, cold, pin, vibration and joint position sense.  No extinction to double simultaneous stimulation.  Romberg test negative Deep Tendon Reflexes: +2 throughout Cerebellar: no incoordination on finger to nose testing Gait: narrow-based and steady, able to tandem walk adequately. Tremor: none   IMPRESSION: This is a 65 year old right-handed woman with a history of hypertension, hyperlipidemia, hypothyroidism, complete heart block, presenting to establish care for seizures. Review of records indicate she has had an extensive evaluation for these spells, it remains unclear if they are epileptic or non-epileptic/related to atypical migraine. She is on Zonisamide for symptom management, we discussed increasing dose to 400mg  qhs.  She is aware of [place] driving laws to stop driving after a seizure, until 6 months seizure-free. She endorses significant amount of stress at home which  triggers her episodes, and agrees to referral for psychotherapy. Follow-up in 3-4 months, call for any changes.    Thank you for allowing me to participate in the care of this patient.  Please do not hesitate to call for any questions or concerns.   Ellouise Newer, M.D.  CC: Dr. [name]

## 2020-11-02 NOTE — Patient Instructions (Signed)
Good to meet you!  Increase Zonisamide 100mg : Take 4 capsules every night  2. Referral will be sent for therapy  3. Follow-up in 3-4 months, call for any changes   Seizure Precautions: 1. If medication has been prescribed for you to prevent seizures, take it exactly as directed.  Do not stop taking the medicine without talking to your doctor first, even if you have not had a seizure in a long time.   2. Avoid activities in which a seizure would cause danger to yourself or to others.  Don't operate dangerous machinery, swim alone, or climb in high or dangerous places, such as on ladders, roofs, or girders.  Do not drive unless your doctor says you may.  3. If you have any warning that you may have a seizure, lay down in a safe place where you can't hurt yourself.    4.  No driving for 6 months from last seizure, as per [facility].   Please refer to the following link on the [place] website for more information: http://www.epilepsyfoundation.org/answerplace/Social/driving/drivingu.cfm   5.  Maintain good sleep hygiene. Avoid alcohol.  6.  Contact your doctor if you have any problems that may be related to the medicine you are taking.  7.  Call 911 and bring the patient back to the ED if:        A.  The seizure lasts longer than 5 minutes.       B.  The patient doesn't awaken shortly after the seizure  C.  The patient has new problems such as difficulty seeing, speaking or moving  D.  The patient was injured during the seizure  E.  The patient has a temperature over 102 F (39C)  F.  The patient vomited and now is having trouble breathing

## 2020-11-08 ENCOUNTER — Encounter: Payer: Self-pay | Admitting: Internal Medicine

## 2020-11-09 ENCOUNTER — Encounter: Payer: Self-pay | Admitting: Family Medicine

## 2020-11-09 ENCOUNTER — Telehealth (INDEPENDENT_AMBULATORY_CARE_PROVIDER_SITE_OTHER): Payer: BC Managed Care – PPO | Admitting: Family Medicine

## 2020-11-09 DIAGNOSIS — H9201 Otalgia, right ear: Secondary | ICD-10-CM

## 2020-11-09 MED ORDER — CEFDINIR 300 MG PO CAPS: 300.0000 mg | ORAL_CAPSULE | Freq: Two times a day (BID) | ORAL | 0 refills | Status: DC

## 2020-11-09 NOTE — Progress Notes (Signed)
Virtual Visit via Video Note  I connected with [name]  on 11/09/20 at  1:20 PM EST by a video enabled telemedicine application and verified that I am speaking with the correct person using two identifiers.  Location patient: home, [place] Location provider:work or home office Persons participating in the virtual visit: patient, provider  I discussed the limitations of evaluation and management by telemedicine and the availability of in person appointments. The patient expressed understanding and agreed to proceed.   HPI:  Acute telemedicine visit for ear pain: -Onset: the last 3-4days -reports a history of recurrent ear infections/resp infections -reports requires abx several times per year for this -Symptoms include:R ear pain for several days,  some sinus congestion and fever a few nights ago, felt hot last night -Denies:CP, SOB, NVD -Pertinent past medical history: recurrent ear infection, has seen ENT and they did ear tubes as an adult -Pertinent medication allergies:  Allergies  Allergen Reactions   Fish Allergy Anaphylaxis and Swelling    Scaled fish, like flounder   Penicillins Anaphylaxis, Hives and Rash    Did it involve swelling of the face/tongue/throat, SOB, or low BP? Yes Did it involve sudden or severe rash/hives, skin peeling, or any reaction on the inside of your mouth or nose? Unk Did you need to seek medical attention at a hospital or doctor's office? No When did it last happen? "I was 65 years old" If all above answers are "NO", may proceed with cephalosporin use.  Other reaction(s): Unknown Did it involve swelling of the face/tongue/throat, SOB, or low BP? Yes Did it involve sudden or severe rash/hives, skin peeling, or any reaction on the inside of your mouth or nose? Unk Did you need to seek medical attention at a hospital or doctor's office? No When did it last happen? "I was 65 years old" If all above answers are "NO", may proceed with cephalosporin use.    Levofloxacin In D5w Nausea And Vomiting and Swelling    Causes throat swelling   Nitroglycerin Other (See Comments)    Other reaction(s): Other (See Comments) Loss of consciousness Loss of consciousness Loss of consciousness   Pneumovax [Pneumococcal Polysaccharide Vaccine] Other (See Comments)    Rash, arm swelling   Sulfamethoxazole     Other reaction(s): GI bleeding Other reaction(s): Blood Disorder   Latex Dermatitis and Rash    If worn for more than a day, this causes blisters   -reports her allergy to penicillin was hives only - not anaphylaxis -reports tolerates cephalosporins, prefers these and wants to try omnicef  ROS: See pertinent positives and negatives per HPI.  Past Medical History:  Diagnosis Date   Abdominal pain, epigastric 10/25/2007   ADD 01/20/2009   AKI (acute kidney injury) ([place]) 04/11/2016   ALLERGIC RHINITIS 10/15/2006   ANGIOEDEMA 08/12/2008   ANXIETY 10/15/2006   ASTHMATIC BRONCHITIS, ACUTE 01/27/2008   Bowel obstruction (HCC)    Breast cancer (HCC)    Cellulitis and abscess of leg, except foot 08/12/2007   CELLULITIS, FACE 12/21/2008   CHB (complete heart block) ([place]) 04/14/2016   CHEST PAIN 06/14/2007   Chronic anticoagulation 05/16/2016   Chronic sinus infection 04/12/2010   COLECTOMY, PARTIAL, WITH ANASTOMOSIS, HX OF 10/15/2006   COLONIC POLYPS, HX OF 10/15/2006   CYST, OVARIAN NEC/NOS 10/15/2006   Dengue 02/22/2010   DVT, HX OF 10/15/2006   DYSAUTONOMIA 10/15/2006   Dysfunction of eustachian tube 05/26/2009   GI BLEEDING 04/04/2007   Glossitis 03/06/2008   HEADACHE, CHRONIC 04/04/2007  Heart murmur    Hematemesis 10/25/2007   HEMORRHOIDS 04/04/2007   HYPERLIPIDEMIA 12/12/2007   HYPERTENSION 10/15/2006   Hypertrophic cardiomyopathy ([place])    HYPOTHYROIDISM 03/06/2008   IBS 04/04/2007   MASTECTOMY, BILATERAL, HX OF 08/12/2009   NECK MASS 07/01/2007   NEOP, MALIGNANT, FEMALE BREAST NOS 10/15/2006   NEOP, MALIGNANT, 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   Ovarian cancer  ([place])    Presence of cardiac pacemaker 04/25/2016   Red blood cell antibody positive    K   Seizures ([place])    Subdural hematoma    SYNCOPE 07/01/2007   Wheezing 08/20/2008   WOUND, OPEN, LEG, WITHOUT COMPLICATION 03/25/4008    Past Surgical History:  Procedure Laterality Date   ABDOMINAL HYSTERECTOMY     APPENDECTOMY     BREAST RECONSTRUCTION     CARDIAC ELECTROPHYSIOLOGY MAPPING AND ABLATION     CESAREAN SECTION     CHOLECYSTECTOMY     COLON RESECTION  2005   COLON SURGERY  06/2020   COLONOSCOPY W/ BIOPSIES     FLEXIBLE SIGMOIDOSCOPY     LEFT AND RIGHT HEART CATHETERIZATION WITH CORONARY ANGIOGRAM N/A 10/07/2012   Procedure: LEFT AND RIGHT HEART CATHETERIZATION WITH CORONARY ANGIOGRAM;  Surgeon: Jolaine Artist, MD;  Location: [facility] CATH LAB;  Service: Cardiovascular;  Laterality: N/A;   MASTECTOMY Bilateral    MINIMALLY INVASIVE TRICUSPID VALVE REPAIR     MITRAL VALVE REPAIR  2017   MITRAL VALVE REPLACEMENT     OOPHORECTOMY     SPIGELIAN HERNIA       Current Outpatient Medications:    acetaminophen-codeine (TYLENOL #3) 300-30 MG tablet, Take 1 tablet by mouth 4 (four) times daily as needed. for pain, Disp: 120 tablet, Rfl: 0   cefdinir (OMNICEF) 300 MG capsule, Take 1 capsule (300 mg total) by mouth 2 (two) times daily., Disp: 14 capsule, Rfl: 0   fluticasone (FLONASE) 50 MCG/ACT nasal spray, SHAKE LIQUID AND USE 2 SPRAYS IN EACH NOSTRIL DAILY, Disp: 16 g, Rfl: 0   levothyroxine (SYNTHROID) 50 MCG tablet, Take 1 tablet (50 mcg total) by mouth daily., Disp: 90 tablet, Rfl: 0   metoprolol tartrate (LOPRESSOR) 50 MG tablet, Take by mouth., Disp: , Rfl:    ondansetron (ZOFRAN) 4 MG tablet, Take 1 tablet (4 mg total) by mouth every 8 (eight) hours as needed for nausea or vomiting., Disp: 30 tablet, Rfl: 0   ondansetron (ZOFRAN-ODT) 8 MG disintegrating tablet, Take 1 tablet (8 mg total) by mouth every 8 (eight) hours as needed for nausea., Disp: 30 tablet, Rfl: 0   venlafaxine XR  (EFFEXOR-XR) 150 MG 24 hr capsule, Take 1 capsule (150 mg total) by mouth daily., Disp: 90 capsule, Rfl: 3   vitamin C (ASCORBIC ACID) 500 MG tablet, Take 500 mg by mouth daily. , Disp: , Rfl:    warfarin (COUMADIN) 10 MG tablet, Take 1 tablet daily or as directed by anticoagulation clinic (Patient taking differently: Take 10 mg by mouth at bedtime.), Disp: 90 tablet, Rfl: 0   zolpidem (AMBIEN) 10 MG tablet, TAKE 1 TABLET BY MOUTH EVERY DAY AT BEDTIME AS NEEDED, Disp: 90 tablet, Rfl: 1   zonisamide (ZONEGRAN) 100 MG capsule, Take 4 capsules every night, Disp: 120 capsule, Rfl: 11  EXAM:  VITALS per patient if applicable:  GENERAL: alert, oriented, appears well and in no acute distress  HEENT: atraumatic, conjunttiva clear, no obvious abnormalities on inspection of external nose and ears  NECK: normal movements of the head and neck  LUNGS: on  inspection no signs of respiratory distress, breathing rate appears normal, no obvious gross SOB, gasping or wheezing  CV: no obvious cyanosis  MS: moves all visible extremities without noticeable abnormality  PSYCH/NEURO: pleasant and cooperative, no obvious depression or anxiety, speech and thought processing grossly intact  ASSESSMENT AND PLAN:  Discussed the following assessment and plan:  Right ear pain  -we discussed possible serious and likely etiologies, options for evaluation and workup, limitations of telemedicine visit vs in person visit, treatment, treatment risks and precautions. Pt is agreeable to treatment via telemedicine at this moment. Eports hx of recurrent ear infections and this feels like an ear infection to her. Reports severe infections if not treated. Reports has seen ent and had ear tubes as an adult. She is requesting a cephalosporin, wants to try omnicef empirically. Discussed risks and potential for cross reaction. Advised to seek prompt in person care with PCP or ENT if worsening, new symptoms arise, or if is not  improving with treatment. Discussed options for inperson care if PCP office not available. Did let this patient know that I only do telemedicine on Tuesdays and Thursdays for [place]. Advised to schedule follow up visit with PCP or [facility] if any further questions or concerns to avoid delays in care.   I discussed the assessment and treatment plan with the patient. The patient was provided an opportunity to ask questions and all were answered. The patient agreed with the plan and demonstrated an understanding of the instructions.     Lucretia Kern, DO

## 2020-11-09 NOTE — Patient Instructions (Signed)
-  I sent the medication(s) we discussed to your pharmacy: Meds ordered this encounter  Medications   cefdinir (OMNICEF) 300 MG capsule    Sig: Take 1 capsule (300 mg total) by mouth 2 (two) times daily.    Dispense:  14 capsule    Refill:  0     I hope you are feeling better soon!  Seek in person care promptly if your symptoms worsen, new concerns arise or you are not improving with treatment.  It was nice to meet you today. I help [facility] out with telemedicine visits on Tuesdays and Thursdays and am available for visits on those days. If you have any concerns or questions following this visit please schedule a follow up visit with your Primary Care doctor or seek care at a local urgent care clinic to avoid delays in care.   

## 2020-11-11 ENCOUNTER — Other Ambulatory Visit: Payer: Self-pay | Admitting: Internal Medicine

## 2020-11-11 DIAGNOSIS — J189 Pneumonia, unspecified organism: Secondary | ICD-10-CM

## 2020-11-18 ENCOUNTER — Ambulatory Visit (INDEPENDENT_AMBULATORY_CARE_PROVIDER_SITE_OTHER): Payer: BC Managed Care – PPO | Admitting: Internal Medicine

## 2020-11-18 ENCOUNTER — Other Ambulatory Visit: Payer: Self-pay

## 2020-11-18 ENCOUNTER — Encounter: Payer: Self-pay | Admitting: Internal Medicine

## 2020-11-18 VITALS — BP 122/70 | HR 68 | Ht 69.0 in

## 2020-11-18 DIAGNOSIS — R062 Wheezing: Secondary | ICD-10-CM | POA: Diagnosis not present

## 2020-11-18 DIAGNOSIS — K580 Irritable bowel syndrome with diarrhea: Secondary | ICD-10-CM | POA: Diagnosis not present

## 2020-11-18 DIAGNOSIS — R0689 Other abnormalities of breathing: Secondary | ICD-10-CM

## 2020-11-18 DIAGNOSIS — J069 Acute upper respiratory infection, unspecified: Secondary | ICD-10-CM

## 2020-11-18 MED ORDER — ACETAMINOPHEN-CODEINE #3 300-30 MG PO TABS: 1.0000 | ORAL_TABLET | Freq: Four times a day (QID) | ORAL | 0 refills | Status: DC | PRN

## 2020-11-18 MED ORDER — CEFDINIR 300 MG PO CAPS: 300.0000 mg | ORAL_CAPSULE | Freq: Two times a day (BID) | ORAL | 0 refills | Status: DC

## 2020-11-18 MED ORDER — ALBUTEROL SULFATE HFA 108 (90 BASE) MCG/ACT IN AERS: 2.0000 | INHALATION_SPRAY | Freq: Four times a day (QID) | RESPIRATORY_TRACT | 5 refills | Status: DC | PRN

## 2020-11-18 NOTE — Progress Notes (Signed)
Patient ID: [name], female   DOB: 05/15/1955, 65 y.o.   MRN: [id number]        Chief Complaint: follow up right ear pain, wheezing, ibs and few RLL rales today       HPI:  [name] is a 65 y.o. female here  Here with 2-3 days acute onset fever, facial pain, pressure, headache, general weakness and malaise, and greenish d/c, with mild ST and cough, but pt denies chest pain, wheezing, increased sob or doe, orthopnea, PND, increased LE swelling, palpitations, dizziness or syncope, except for onset expiratory wheezing last night and today.  Asks for refill tylenol #3 with chronic ibs with diarrhea.   Pt denies polydipsia, polyuria, or new focl neuro s/s.         Wt Readings from Last 3 Encounters:  11/02/20 215 lb 3.2 oz (97.6 kg)  03/11/20 210 lb 6 oz (95.4 kg)  02/06/19 218 lb 1.3 oz (98.9 kg)   BP Readings from Last 3 Encounters:  11/18/20 122/70  11/02/20 (!) 144/75  09/29/20 130/80         Past Medical History:  Diagnosis Date   Abdominal pain, epigastric 10/25/2007   ADD 01/20/2009   AKI (acute kidney injury) ([place]) 04/11/2016   ALLERGIC RHINITIS 10/15/2006   ANGIOEDEMA 08/12/2008   ANXIETY 10/15/2006   ASTHMATIC BRONCHITIS, ACUTE 01/27/2008   Bowel obstruction ([place])    Breast cancer (HCC)    Cellulitis and abscess of leg, except foot 08/12/2007   CELLULITIS, FACE 12/21/2008   CHB (complete heart block) ([place]) 04/14/2016   CHEST PAIN 06/14/2007   Chronic anticoagulation 05/16/2016   Chronic sinus infection 04/12/2010   COLECTOMY, PARTIAL, WITH ANASTOMOSIS, HX OF 10/15/2006   COLONIC POLYPS, HX OF 10/15/2006   CYST, OVARIAN NEC/NOS 10/15/2006   Dengue 02/22/2010   DVT, HX OF 10/15/2006   DYSAUTONOMIA 10/15/2006   Dysfunction of eustachian tube 05/26/2009   GI BLEEDING 04/04/2007   Glossitis 03/06/2008   HEADACHE, CHRONIC 04/04/2007   Heart murmur    Hematemesis 10/25/2007   HEMORRHOIDS 04/04/2007   HYPERLIPIDEMIA 12/12/2007   HYPERTENSION 10/15/2006   Hypertrophic  cardiomyopathy ([place])    HYPOTHYROIDISM 03/06/2008   IBS 04/04/2007   MASTECTOMY, BILATERAL, HX OF 08/12/2009   NECK MASS 07/01/2007   NEOP, MALIGNANT, FEMALE BREAST NOS 10/15/2006   NEOP, MALIGNANT, 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   Ovarian cancer ([place])    Presence of cardiac pacemaker 04/25/2016   Red blood cell antibody positive    K   Seizures ([place])    Subdural hematoma    SYNCOPE 07/01/2007   Wheezing 08/20/2008   WOUND, OPEN, LEG, WITHOUT COMPLICATION 3/90/3009   Past Surgical History:  Procedure Laterality Date   ABDOMINAL HYSTERECTOMY     APPENDECTOMY     BREAST RECONSTRUCTION     CARDIAC ELECTROPHYSIOLOGY MAPPING AND ABLATION     CESAREAN SECTION     CHOLECYSTECTOMY     COLON RESECTION  2005   COLON SURGERY  06/2020   COLONOSCOPY W/ BIOPSIES     FLEXIBLE SIGMOIDOSCOPY     LEFT AND RIGHT HEART CATHETERIZATION WITH CORONARY ANGIOGRAM N/A 10/07/2012   Procedure: LEFT AND RIGHT HEART CATHETERIZATION WITH CORONARY ANGIOGRAM;  Surgeon: Jolaine Artist, MD;  Location: [facility] CATH LAB;  Service: Cardiovascular;  Laterality: N/A;   MASTECTOMY Bilateral    MINIMALLY INVASIVE TRICUSPID VALVE REPAIR     MITRAL VALVE REPAIR  2017   MITRAL VALVE REPLACEMENT     OOPHORECTOMY  SPIGELIAN HERNIA      reports that she has never smoked. She has never used smokeless tobacco. She reports that she does not currently use alcohol. She reports that she does not use drugs. family history includes Diabetes in her son; Heart disease in her mother; Melanoma in her father; Ovarian cancer in her sister; Pancreatic cancer in her father; Uterine cancer in her mother. Allergies  Allergen Reactions   Fish Allergy Anaphylaxis and Swelling    Scaled fish, like flounder   Penicillins Anaphylaxis, Hives and Rash    Did it involve swelling of the face/tongue/throat, SOB, or low BP? Yes Did it involve sudden or severe rash/hives, skin peeling, or any reaction on the inside of your mouth or nose? Unk Did you need to seek  medical attention at a hospital or doctor's office? No When did it last happen? "I was 65 years old" If all above answers are "NO", may proceed with cephalosporin use.  Other reaction(s): Unknown Did it involve swelling of the face/tongue/throat, SOB, or low BP? Yes Did it involve sudden or severe rash/hives, skin peeling, or any reaction on the inside of your mouth or nose? Unk Did you need to seek medical attention at a hospital or doctor's office? No When did it last happen? "I was 65 years old" If all above answers are "NO", may proceed with cephalosporin use.   Levofloxacin In D5w Nausea And Vomiting and Swelling    Causes throat swelling   Nitroglycerin Other (See Comments)    Other reaction(s): Other (See Comments) Loss of consciousness Loss of consciousness Loss of consciousness   Pneumovax [Pneumococcal Polysaccharide Vaccine] Other (See Comments)    Rash, arm swelling   Sulfamethoxazole     Other reaction(s): GI bleeding Other reaction(s): Blood Disorder   Latex Dermatitis and Rash    If worn for more than a day, this causes blisters    Current Outpatient Medications on File Prior to Visit  Medication Sig Dispense Refill   fluticasone (FLONASE) 50 MCG/ACT nasal spray SHAKE LIQUID AND USE 2 SPRAYS IN EACH NOSTRIL DAILY 16 g 0   levothyroxine (SYNTHROID) 50 MCG tablet Take 1 tablet (50 mcg total) by mouth daily. 90 tablet 0   metoprolol tartrate (LOPRESSOR) 50 MG tablet Take by mouth.     ondansetron (ZOFRAN) 4 MG tablet Take 1 tablet (4 mg total) by mouth every 8 (eight) hours as needed for nausea or vomiting. 30 tablet 0   ondansetron (ZOFRAN-ODT) 8 MG disintegrating tablet Take 1 tablet (8 mg total) by mouth every 8 (eight) hours as needed for nausea. 30 tablet 0   venlafaxine XR (EFFEXOR-XR) 150 MG 24 hr capsule Take 1 capsule (150 mg total) by mouth daily. 90 capsule 3   vitamin C (ASCORBIC ACID) 500 MG tablet Take 500 mg by mouth daily.      warfarin (COUMADIN) 10 MG  tablet Take 1 tablet daily or as directed by anticoagulation clinic (Patient taking differently: Take 10 mg by mouth at bedtime.) 90 tablet 0   zolpidem (AMBIEN) 10 MG tablet TAKE 1 TABLET BY MOUTH EVERY DAY AT BEDTIME AS NEEDED 90 tablet 1   zonisamide (ZONEGRAN) 100 MG capsule Take 4 capsules every night 120 capsule 11   No current facility-administered medications on file prior to visit.        ROS:  All others reviewed and negative.  Objective        PE:  BP 122/70 (BP Location: Right Arm, Patient Position: Sitting,  Cuff Size: Large)   Pulse 68   Ht 5\' 9"  (1.753 m)   SpO2 100%   BMI 31.78 kg/m                 Constitutional: Pt appears in NAD               HENT: Head: NCAT.                Right Ear: External ear normal.                 Left Ear: External ear normal.                Eyes: . Pupils are equal, round, and reactive to light. Conjunctivae and EOM are normal; Bilat tm's with mild erythema.  Max sinus areas non tender.  Pharynx with mild erythema, no exudate               Nose: without d/c or deformity               Neck: Neck supple. Gross normal ROM               Cardiovascular: Normal rate and regular rhythm.                 Pulmonary/Chest: Effort normal and breath sounds with few RLL rales and mild expiratory wheezing.                Abd:  Soft, NT, ND, + BS, no organomegaly               Neurological: Pt is alert. At baseline orientation, motor grossly intact               Skin: Skin is warm. No rashes, no other new lesions, LE edema - none               Psychiatric: Pt behavior is normal without agitation   Micro: none  Cardiac tracings I have personally interpreted today:  none  Pertinent Radiological findings (summarize): none   Lab Results  Component Value Date   WBC 7.4 02/14/2019   HGB 14.1 02/14/2019   HCT 42.7 02/14/2019   PLT 246 02/14/2019   GLUCOSE 152 (H) 02/06/2019   CHOL 209 (H) 12/13/2011   TRIG 195.0 (H) 12/13/2011   HDL 45.10 12/13/2011    LDLDIRECT 137.4 12/13/2011   LDLCALC 73 02/11/2008   ALT 19 02/06/2019   AST 17 02/06/2019   NA 137 02/06/2019   K 4.2 02/06/2019   CL 101 02/06/2019   CREATININE 1.01 (H) 02/06/2019   BUN 23 02/06/2019   CO2 23 02/06/2019   TSH 3.04 10/02/2012   INR 3.6 (A) 08/09/2018   HGBA1C  08/12/2007    5.2 (NOTE)   The ADA recommends the following therapeutic goal for glycemic   control related to Hgb A1C measurement:   Goal of Therapy:   < 7.0% Hgb A1C   Reference: American Diabetes Association: Clinical Practice   Recommendations 2008, Diabetes Care,  2008, 31:(Suppl 1).   Assessment/Plan:  [name] is a 65 y.o. White or Caucasian [1] female with  has a past medical history of Abdominal pain, epigastric (10/25/2007), ADD (01/20/2009), AKI (acute kidney injury) ([place]) (04/11/2016), ALLERGIC RHINITIS (10/15/2006), ANGIOEDEMA (08/12/2008), ANXIETY (10/15/2006), ASTHMATIC BRONCHITIS, ACUTE (01/27/2008), Bowel obstruction ([place]), Breast cancer ([place]), Cellulitis and abscess of leg, except foot (08/12/2007), CELLULITIS, FACE (12/21/2008), CHB (complete heart block) ([place]) (04/14/2016), CHEST PAIN (  06/14/2007), Chronic anticoagulation (05/16/2016), Chronic sinus infection (04/12/2010), COLECTOMY, PARTIAL, WITH ANASTOMOSIS, HX OF (10/15/2006), COLONIC POLYPS, HX OF (10/15/2006), CYST, OVARIAN NEC/NOS (10/15/2006), Dengue (02/22/2010), DVT, HX OF (10/15/2006), DYSAUTONOMIA (10/15/2006), Dysfunction of eustachian tube (05/26/2009), GI BLEEDING (04/04/2007), Glossitis (03/06/2008), HEADACHE, CHRONIC (04/04/2007), Heart murmur, Hematemesis (10/25/2007), HEMORRHOIDS (04/04/2007), HYPERLIPIDEMIA (12/12/2007), HYPERTENSION (10/15/2006), Hypertrophic cardiomyopathy ([place]), HYPOTHYROIDISM (03/06/2008), IBS (04/04/2007), MASTECTOMY, BILATERAL, HX OF (08/12/2009), NECK MASS (07/01/2007), NEOP, MALIGNANT, FEMALE BREAST NOS (10/15/2006), NEOP, MALIGNANT, THYMUS (10/15/2006), Ovarian cancer ([place]), Presence of cardiac pacemaker (04/25/2016), Red blood  cell antibody positive, Seizures ([place]), Subdural hematoma, SYNCOPE (07/01/2007), Wheezing (08/20/2008), and WOUND, OPEN, LEG, WITHOUT COMPLICATION (8/64/8472).  IBS Overall stable, for tylneol #3 refill,  to f/u any worsening symptoms or concerns  URI (upper respiratory infection) Mild to mod, for antibx course,  to f/u any worsening symptoms or concerns  Wheezing Mild exp wheezing, for albuterol hfa prn,  to f/u any worsening symptoms or concerns  Abnormal breath sounds ? Etiology unclear, for cxr r/o pna  Followup: Return if symptoms worsen or fail to improve.  Cathlean Cower, MD 11/20/2020 9:20 PM [facility]

## 2020-11-18 NOTE — Patient Instructions (Signed)
Please take all new medication as prescribed - the antibiotic  Please take all new medication as prescribed - also the albuterol inhaler as needed  Please continue all other medications as before, and refills have been done if requested - the tylenol #3  Please have the pharmacy call with any other refills you may need.  Please continue your efforts at being more active, low cholesterol diet, and weight control.  Please keep your appointments with your specialists as you may have planned  Please go to the XRAY Dept at your local hosp with the written request done hardcopy today

## 2020-11-20 ENCOUNTER — Encounter: Payer: Self-pay | Admitting: Internal Medicine

## 2020-11-20 DIAGNOSIS — R0689 Other abnormalities of breathing: Secondary | ICD-10-CM | POA: Insufficient documentation

## 2020-11-20 DIAGNOSIS — J069 Acute upper respiratory infection, unspecified: Secondary | ICD-10-CM | POA: Insufficient documentation

## 2020-11-20 NOTE — Assessment & Plan Note (Signed)
Mild to mod, for antibx course,  to f/u any worsening symptoms or concerns 

## 2020-11-20 NOTE — Assessment & Plan Note (Signed)
?   Etiology unclear, for cxr r/o pna

## 2020-11-20 NOTE — Assessment & Plan Note (Signed)
Mild exp wheezing, for albuterol hfa prn,  to f/u any worsening symptoms or concerns

## 2020-11-20 NOTE — Assessment & Plan Note (Signed)
Overall stable, for tylneol #3 refill,  to f/u any worsening symptoms or concerns

## 2020-11-24 ENCOUNTER — Encounter: Payer: Self-pay | Admitting: Internal Medicine

## 2020-11-27 ENCOUNTER — Other Ambulatory Visit: Payer: Self-pay | Admitting: Internal Medicine

## 2020-12-02 ENCOUNTER — Other Ambulatory Visit: Payer: Self-pay | Admitting: Internal Medicine

## 2020-12-02 NOTE — Telephone Encounter (Signed)
Please refill as per office routine med refill policy (all routine meds to be refilled for 3 mo or monthly (per pt preference) up to one year from last visit, then month to month grace period for 3 mo, then further med refills will have to be denied) ? ?

## 2020-12-03 DIAGNOSIS — J189 Pneumonia, unspecified organism: Secondary | ICD-10-CM | POA: Diagnosis not present

## 2020-12-03 DIAGNOSIS — R062 Wheezing: Secondary | ICD-10-CM | POA: Diagnosis not present

## 2020-12-17 ENCOUNTER — Telehealth: Payer: Self-pay | Admitting: Internal Medicine

## 2020-12-17 DIAGNOSIS — K580 Irritable bowel syndrome with diarrhea: Secondary | ICD-10-CM

## 2020-12-17 MED ORDER — ACETAMINOPHEN-CODEINE #3 300-30 MG PO TABS: 1.0000 | ORAL_TABLET | Freq: Four times a day (QID) | ORAL | 0 refills | Status: DC | PRN

## 2020-12-17 NOTE — Telephone Encounter (Signed)
1.Medication Requested: Acetaminophen- codeine   2. Pharmacy (Name, Street, [place]): [facility] ([address]   3. On Med List: yes  4. Last Visit with PCP: 11.17.2022  5. Next visit date with PCP: NA   Agent: Please be advised that RX refills may take up to 3 business days. We ask that you follow-up with your pharmacy.

## 2021-01-07 ENCOUNTER — Telehealth: Payer: Self-pay | Admitting: Neurology

## 2021-01-07 NOTE — Telephone Encounter (Signed)
Pt c/o: seizure Missed medications?  No. Sleep deprived?  No. Alcohol intake?  No. Back to their usual baseline self?  Yes.  . If no, advise go to ER Increase in stress? Yes Notice ny triggers? NO .. looking down is a trigger  Current medications prescribed by Dr. Delice Lesch: zonisamide 100mg  4 capsules every night

## 2021-01-07 NOTE — Telephone Encounter (Signed)
[name] wants aquino to know she has been having more seizures and she and her family are concerned. Would like a call to discuss

## 2021-01-10 MED ORDER — ZONISAMIDE 100 MG PO CAPS: ORAL_CAPSULE | ORAL | 6 refills | Status: DC

## 2021-01-10 NOTE — Telephone Encounter (Signed)
Pt called she would like to try to increase Zonisamide to 5 tablets qhs as advised by Dr Delice Lesch

## 2021-01-10 NOTE — Telephone Encounter (Signed)
She was previously on a higher dose of Zonisamide, does she want to try increasing Zonisamide further to 5 caps qhs? If yes, pls send in updated Rx. Thanks

## 2021-01-18 ENCOUNTER — Telehealth: Payer: Self-pay

## 2021-01-18 DIAGNOSIS — K580 Irritable bowel syndrome with diarrhea: Secondary | ICD-10-CM

## 2021-01-18 MED ORDER — ACETAMINOPHEN-CODEINE #3 300-30 MG PO TABS: 1.0000 | ORAL_TABLET | Freq: Four times a day (QID) | ORAL | 0 refills | Status: DC | PRN

## 2021-01-18 NOTE — Telephone Encounter (Signed)
Pt calling in requesting a refill for: acetaminophen-codeine (TYLENOL #3) 300-30 MG tablet.  Pharmacy: [facility] [place], [place] [address]  LOV 11/18/20  CB [phone]

## 2021-02-03 ENCOUNTER — Encounter: Payer: Self-pay | Admitting: Neurology

## 2021-02-03 ENCOUNTER — Telehealth (INDEPENDENT_AMBULATORY_CARE_PROVIDER_SITE_OTHER): Payer: BC Managed Care – PPO | Admitting: Neurology

## 2021-02-03 VITALS — Ht 69.0 in | Wt 200.0 lb

## 2021-02-03 DIAGNOSIS — R4689 Other symptoms and signs involving appearance and behavior: Secondary | ICD-10-CM

## 2021-02-03 NOTE — Progress Notes (Signed)
Virtual Visit via Video Note The purpose of this virtual visit is to provide medical care while limiting exposure to the novel coronavirus.    Consent was obtained for video visit:  Yes.   Answered questions that patient had about telehealth interaction:  Yes.   I discussed the limitations, risks, security and privacy concerns of performing an evaluation and management service by telemedicine. I also discussed with the patient that there may be a patient responsible charge related to this service. The patient expressed understanding and agreed to proceed.  Pt location: Home Physician Location: office Name of referring provider:  Biagio Borg, MD I connected with [name] at patients initiation/request on 02/03/2021 at  4:00 PM EST by video enabled telemedicine application and verified that I am speaking with the correct person using two identifiers. Pt MRN:  [id number] Pt DOB:  1955/06/11 Video Participants:  [name]; [name] (spouse)   History of Present Illness:  The patient had a virtual video visit on 02/03/2021. She was last seen in the neurology clinic 3 months ago for a diagnosis of seizures. She started having near daily episodes at age 10 with flashing lights in her vision with abnormal eye movements, she would have loss of control of her eye movements and would be told her eyes move in random directions. There would be loss of speech and unresponsiveness and she loses ability to maintain posture. She had video EEG monitoring at [facility] in 2020, baseline EEG normal, 2 spells that were not her typical symptoms did not show any EEG changes. Epileptologist Dr. Lysle Rubens noted etiology of spells still unclear if epileptic or non-epileptic, however since she was doing well with Zonisamide which was also helping with headaches, he recommended continuation of medication. She reported an increase in seizures last month, Zonisamide increased to 500mg  qhs. She reports they continue to come  in little groups, occurring 3-4 times a week, then there would be a week without any episodes. She reports the episodes are the same, she would call out for help and tries to get down, she denies any falls because she tries to get to a chair as fast as she can. Her husband reports she would not respond during them, she repeats a word or phrase, gets cold, then gradually goes to sleep 15-30 minutes later feeling terrible. There is minor twitching, "not noticeable," she moves her head back and forth a lot. Last episode was 2 nights ago. No nocturnal episodes. She notes her left 4th and 5th digits are numb. She is worried she would not be able to do a mission trip to [place] for 2 weeks on 2/16 and needs a note for the airline.    History on Initial Assessment 11/02/2020: This is a 66 year old right-handed woman with a history of hypertension, hyperlipidemia, hypothyroidism, complete heart block, presenting to establish care for seizures. Records from [facility] were reviewed and will be summarized below. She has seen several neurologists, she was evaluated by epileptologist Dr. Lysle Rubens, last visit was in January 2021. She started having recurrent episodes at age 66 where she would have near daily episodes of flashing lights in her vision with abnormal eye movements. She would have loss of control of her eye movements and would be told her eyes move in random directions. There would be loss of speech and unresponsiveness and she loses ability to maintain posture. She would have an aura of double vision and sit herself down. She would get a  pillow because she "seems to throw myself around or fall out." Episodes would last a few minutes to up to 30 minutes, she would have no recollection of events and would take 3-4 hours to return to baseline. She would feel tired and cold. There would be no rhythmic jerking of extremities. Symptoms were initially attributed to ocular migraines and she tried Topiramate and Diamox but could not  tolerate them. She was started on Zonisamide with note of reduction in events. They typically occur when she is up and about, no nocturnal episodes or events when supine. She had 2 normal routine EEGs, then underwent video EEG monitoring at [facility] from 09/30/2018 to 10/05/2018 where baseline EEG was normal. There were 2 events where she felt her face becoming flushed and feeling weird in her chest, no EEG correlate seen.  During her admission, she had a brain MRI showing an incidental subdural hematoma in the posterior fossa. CT head repeated a week after showed resolving size of SDH. Per Dr. Glenna Durand note, etiology of spells still unclear if epileptic or non-epileptic, however since she was doing well with Zonisamide which was also helping with headaches, he recommended continuation of medication. Her last visit at [facility] in 07/2019 indicated that recurrent spells were felt to be likely related to stress and/or atypical migraine, and she was referred to [facility]. Zonisamide increased to 300mg  qhs for symptom management.  She had been doing well with rare episode occurring once a month, however she ran out of Zonisamide for a month and only restarted it last week. She had an episode of yesterday. She knew she was starting to have it, she would look up to the ceiling then try to get herself to a sofa. While it is going on, she hyperventilates and gets really cold, losing track of time. She denies any associated tongue bite. She has a history of urinary incontinence and wears adult diapers, so it is hard to tell if she had associated incontinence. She denies any olfactory/gustatory hallucinations, deja vu, rising epigastric sensation, focal numbness/tingling/weakness, myoclonic jerks. She denies any side effects on Zonisamide 300mg  qhs, she reports she had taken up to 600mg  qhs at one point but cut herself down and was event-free for a year at that time. She denies any headaches, dizziness. She gets 6-7 hours of  interrupted sleep and feels it is not enough. When asked about mood, she states "everybody tells me I'm ridiculously positive," however she notes being under a lot of stress, and that stress can bring on an episode. Her son with a TBI lives with them.  She has been on venlafaxine since age 9 for hot flashes, but when she tries to get off medication, she is "a crying mess in 2 days." She notes a history of being diagnosed with dysautonomia with hospice nurses coming twice a week because she was terminal, this occurred from 2006 to 2010, then a minister from [place] prayed for her and she was instantly healed.   Epilepsy Risk Factors:  She had a normal birth and early development.  There is no history of febrile convulsions, CNS infections such as meningitis/encephalitis, significant traumatic brain injury, neurosurgical procedures, or family history of seizures.  Prior AEDs: Topiramate  Current Outpatient Medications on File Prior to Visit  Medication Sig Dispense Refill   acetaminophen-codeine (TYLENOL #3) 300-30 MG tablet Take 1 tablet by mouth 4 (four) times daily as needed. for pain 120 tablet 0   albuterol (VENTOLIN HFA) 108 (90 Base) MCG/ACT inhaler  Inhale 2 puffs into the lungs every 6 (six) hours as needed for wheezing or shortness of breath. 8 g 5   levothyroxine (SYNTHROID) 50 MCG tablet TAKE 1 TABLET(50 MCG) BY MOUTH ONCE DAILY 90 tablet 0   metoprolol tartrate (LOPRESSOR) 50 MG tablet Take by mouth.     ondansetron (ZOFRAN) 4 MG tablet Take 1 tablet (4 mg total) by mouth every 8 (eight) hours as needed for nausea or vomiting. 30 tablet 0   ondansetron (ZOFRAN-ODT) 8 MG disintegrating tablet Take 1 tablet (8 mg total) by mouth every 8 (eight) hours as needed for nausea. 30 tablet 0   venlafaxine XR (EFFEXOR-XR) 150 MG 24 hr capsule Take 1 capsule (150 mg total) by mouth daily. 90 capsule 3   vitamin C (ASCORBIC ACID) 500 MG tablet Take 500 mg by mouth daily.      warfarin (COUMADIN) 10  MG tablet Take 1 tablet daily or as directed by anticoagulation clinic (Patient taking differently: Take 10 mg by mouth at bedtime.) 90 tablet 0   zolpidem (AMBIEN) 10 MG tablet TAKE 1 TABLET BY MOUTH EVERY DAY AT BEDTIME AS NEEDED 90 tablet 1   zonisamide (ZONEGRAN) 100 MG capsule Take 5 capsules every night 150 capsule 6   No current facility-administered medications on file prior to visit.     Observations/Objective:   Vitals:   02/03/21 1254  Weight: 200 lb (90.7 kg)  Height: 5\' 9"  (1.753 m)   GEN:  The patient appears stated age and is in NAD.  Neurological examination: Patient is awake, alert. No aphasia or dysarthria. Intact fluency and comprehension.Cranial nerves: Extraocular movements intact with no nystagmus. No facial asymmetry. Motor: moves all extremities symmetrically, at least anti-gravity x 4.   Assessment and Plan:   This is a 66 yo RH woman with a history of hypertension, hyperlipidemia, hypothyroidism, complete heart block, with recurrent episodes of unclear etiology, epileptic versus non-epileptic. Prior monitoring at [facility] in 2020 was normal, however typical events were not captured. There was some response to Zonisamide so this has been continued but she continues to report an increase in spells on Zonisamide 500mg  qhs. We discussed repeating vEEG monitoring to classify episodes to guide further management. Continue Zonisamide 500mg  qhs. She reports numbness in an ulnar distribution, advised to wear an elbow brace, if no improvement, we can do an EMG/NCV. She is aware of [place] driving laws to stop driving after an episode of loss of awareness until 6 months event-free. Follow-up after vEEG monitoring, call for any changes.    Follow Up Instructions:    -I discussed the assessment and treatment plan with the patient. The patient was provided an opportunity to ask questions and all were answered. The patient agreed with the plan and demonstrated an understanding of the  instructions.   The patient was advised to call back or seek an in-person evaluation if the symptoms worsen or if the condition fails to improve as anticipated.    Cameron Sprang, MD

## 2021-02-03 NOTE — Patient Instructions (Signed)
Good to see you.   Schedule inpatient video EEG monitoring study  2. Continue Zonisamide 100mg : Take 5 capsules every night  3. Start using an elbow brace to protect the nerve, if no improvement in symptoms, we can do a nerve test  4. Follow-up after video EEG, call for any changes   Seizure Precautions: 1. If medication has been prescribed for you to prevent seizures, take it exactly as directed.  Do not stop taking the medicine without talking to your doctor first, even if you have not had a seizure in a long time.   2. Avoid activities in which a seizure would cause danger to yourself or to others.  Don't operate dangerous machinery, swim alone, or climb in high or dangerous places, such as on ladders, roofs, or girders.  Do not drive unless your doctor says you may.  3. If you have any warning that you may have a seizure, lay down in a safe place where you can't hurt yourself.    4.  No driving for 6 months from last seizure, as per [facility].   Please refer to the following link on the [facility] website for more information: http://www.epilepsyfoundation.org/answerplace/Social/driving/drivingu.cfm   5.  Maintain good sleep hygiene. Avoid alcohol.  6.  Contact your doctor if you have any problems that may be related to the medicine you are taking.  7.  Call 911 and bring the patient back to the ED if:        A.  The seizure lasts longer than 5 minutes.       B.  The patient doesn't awaken shortly after the seizure  C.  The patient has new problems such as difficulty seeing, speaking or moving  D.  The patient was injured during the seizure  E.  The patient has a temperature over 102 F (39C)  F.  The patient vomited and now is having trouble breathing

## 2021-02-04 ENCOUNTER — Ambulatory Visit: Payer: BC Managed Care – PPO | Admitting: Neurology

## 2021-02-09 DIAGNOSIS — Z954 Presence of other heart-valve replacement: Secondary | ICD-10-CM | POA: Diagnosis not present

## 2021-02-09 DIAGNOSIS — I480 Paroxysmal atrial fibrillation: Secondary | ICD-10-CM | POA: Diagnosis not present

## 2021-02-09 DIAGNOSIS — Z7901 Long term (current) use of anticoagulants: Secondary | ICD-10-CM | POA: Diagnosis not present

## 2021-02-09 DIAGNOSIS — I422 Other hypertrophic cardiomyopathy: Secondary | ICD-10-CM | POA: Diagnosis not present

## 2021-02-18 MED ORDER — ACETAMINOPHEN-CODEINE #3 300-30 MG PO TABS: 1.0000 | ORAL_TABLET | Freq: Four times a day (QID) | ORAL | 0 refills | Status: DC | PRN

## 2021-02-18 NOTE — Telephone Encounter (Signed)
1.Medication Requested: acetaminophen-codeine (TYLENOL #3) 300-30 MG tablet  2. Pharmacy (Name, Street, [facility]): [address], [place] ([place]  Phone:  [phone] Fax:  [phone]   3. On Med List: yes  4. Last Visit with PCP: 11.17.22  5. Next visit date with PCP: n/a   Agent: Please be advised that RX refills may take up to 3 business days. We ask that you follow-up with your pharmacy.

## 2021-02-18 NOTE — Addendum Note (Signed)
Addended by: Biagio Borg on: 02/18/2021 01:15 PM   Modules accepted: Orders

## 2021-02-18 NOTE — Telephone Encounter (Signed)
Called patient to inform that her medication was sent to her pharmacy

## 2021-02-21 ENCOUNTER — Other Ambulatory Visit: Payer: Self-pay

## 2021-02-21 DIAGNOSIS — R55 Syncope and collapse: Secondary | ICD-10-CM

## 2021-02-21 DIAGNOSIS — R4689 Other symptoms and signs involving appearance and behavior: Secondary | ICD-10-CM

## 2021-03-18 ENCOUNTER — Telehealth: Payer: Self-pay | Admitting: Internal Medicine

## 2021-03-18 DIAGNOSIS — K580 Irritable bowel syndrome with diarrhea: Secondary | ICD-10-CM

## 2021-03-18 MED ORDER — ACETAMINOPHEN-CODEINE #3 300-30 MG PO TABS: 1.0000 | ORAL_TABLET | Freq: Four times a day (QID) | ORAL | 0 refills | Status: DC | PRN

## 2021-03-18 NOTE — Telephone Encounter (Signed)
1.Medication Requested: acetaminophen-codeine (TYLENOL #3) 300-30 MG tablet ? ?2. Pharmacy (Name, Street, [facility]): [facility] [place], [place] ([place] ? ?3. On Med List: y ? ?4. Last Visit with PCP: 11-18-2020 ? ?5. Next visit date with PCP: n/a ? ? ?Agent: Please be advised that RX refills may take up to 3 business days. We ask that you follow-up with your pharmacy.  ?

## 2021-03-18 NOTE — Telephone Encounter (Signed)
Refill sent to pharmacy.   

## 2021-04-05 ENCOUNTER — Ambulatory Visit (INDEPENDENT_AMBULATORY_CARE_PROVIDER_SITE_OTHER): Payer: BC Managed Care – PPO

## 2021-04-05 DIAGNOSIS — Z0000 Encounter for general adult medical examination without abnormal findings: Secondary | ICD-10-CM | POA: Diagnosis not present

## 2021-04-05 NOTE — Progress Notes (Signed)
?I connected with [name] today by telephone and verified that I am speaking with the correct person using two identifiers. ?Location patient: home ?Location provider: work ?Persons participating in the virtual visit: patient, provider. ?  ?I discussed the limitations, risks, security and privacy concerns of performing an evaluation and management service by telephone and the availability of in person appointments. I also discussed with the patient that there may be a patient responsible charge related to this service. The patient expressed understanding and verbally consented to this telephonic visit.  ?  ?Interactive audio and video telecommunications were attempted between this provider and patient, however failed, due to patient having technical difficulties OR patient did not have access to video capability.  We continued and completed visit with audio only. ? ?Some vital signs may be absent or patient reported.  ? ?Time Spent with patient on telephone encounter: 30 minutes ? ?Subjective:  ? [name] is a 66 y.o. female who presents for an Initial Medicare Annual Wellness Visit. ? ?Review of Systems    ? ?Cardiac Risk Factors include: advanced age (>35mn, >>48women);dyslipidemia;family history of premature cardiovascular disease;hypertension ? ?   ?Objective:  ?  ?There were no vitals filed for this visit. ?There is no height or weight on file to calculate BMI. ? ? ?  04/05/2021  ?  1:53 PM 02/03/2021  ? 12:55 PM 11/02/2020  ? 10:43 AM 07/04/2018  ?  3:44 PM  ?Advanced Directives  ?Does Patient Have a Medical Advance Directive? No No No No  ?Would patient like information on creating a medical advance directive? No - Patient declined   No - Patient declined  ? ? ?Current Medications (verified) ?Outpatient Encounter Medications as of 04/05/2021  ?Medication Sig  ? acetaminophen-codeine (TYLENOL #3) 300-30 MG tablet Take 1 tablet by mouth 4 (four) times daily as needed. for pain  ? albuterol (VENTOLIN HFA) 108  (90 Base) MCG/ACT inhaler Inhale 2 puffs into the lungs every 6 (six) hours as needed for wheezing or shortness of breath.  ? levothyroxine (SYNTHROID) 50 MCG tablet TAKE 1 TABLET(50 MCG) BY MOUTH ONCE DAILY  ? metoprolol tartrate (LOPRESSOR) 50 MG tablet Take by mouth.  ? ondansetron (ZOFRAN) 4 MG tablet Take 1 tablet (4 mg total) by mouth every 8 (eight) hours as needed for nausea or vomiting.  ? ondansetron (ZOFRAN-ODT) 8 MG disintegrating tablet Take 1 tablet (8 mg total) by mouth every 8 (eight) hours as needed for nausea.  ? venlafaxine XR (EFFEXOR-XR) 150 MG 24 hr capsule Take 1 capsule (150 mg total) by mouth daily.  ? vitamin C (ASCORBIC ACID) 500 MG tablet Take 500 mg by mouth daily.   ? warfarin (COUMADIN) 10 MG tablet Take 1 tablet daily or as directed by anticoagulation clinic (Patient taking differently: Take 10 mg by mouth at bedtime.)  ? zolpidem (AMBIEN) 10 MG tablet TAKE 1 TABLET BY MOUTH EVERY DAY AT BEDTIME AS NEEDED  ? zonisamide (ZONEGRAN) 100 MG capsule Take 5 capsules every night  ? ?No facility-administered encounter medications on file as of 04/05/2021.  ? ? ?Allergies (verified) ?Fish allergy, Penicillins, Levofloxacin in d5w, Nitroglycerin, Pneumovax [pneumococcal polysaccharide vaccine], Sulfamethoxazole, and Latex  ? ?History: ?Past Medical History:  ?Diagnosis Date  ? Abdominal pain, epigastric 10/25/2007  ? ADD 01/20/2009  ? AKI [place] kidney injury) [place] 04/11/2016  ? ALLERGIC RHINITIS 10/15/2006  ? ANGIOEDEMA 08/12/2008  ? ANXIETY 10/15/2006  ? ASTHMATIC BRONCHITIS, ACUTE 01/27/2008  ? Bowel obstruction ([place]   ?  Breast cancer ([place])   ? Cellulitis and abscess of leg, except foot 08/12/2007  ? CELLULITIS, [place] 12/21/2008  ? CHB (complete heart block) ([place]) 04/14/2016  ? CHEST PAIN 06/14/2007  ? Chronic anticoagulation 05/16/2016  ? Chronic sinus infection 04/12/2010  ? COLECTOMY, PARTIAL, WITH ANASTOMOSIS, HX OF 10/15/2006  ? COLONIC POLYPS, HX OF 10/15/2006  ? CYST, OVARIAN NEC/NOS 10/15/2006   ? Dengue 02/22/2010  ? DVT, HX OF 10/15/2006  ? DYSAUTONOMIA 10/15/2006  ? Dysfunction of eustachian tube 05/26/2009  ? GI BLEEDING 04/04/2007  ? Glossitis 03/06/2008  ? HEADACHE, CHRONIC 04/04/2007  ? Heart murmur   ? Hematemesis 10/25/2007  ? HEMORRHOIDS 04/04/2007  ? HYPERLIPIDEMIA 12/12/2007  ? HYPERTENSION 10/15/2006  ? Hypertrophic cardiomyopathy ([place])   ? HYPOTHYROIDISM 03/06/2008  ? IBS 04/04/2007  ? MASTECTOMY, BILATERAL, HX OF 08/12/2009  ? NECK MASS 07/01/2007  ? NEOP, MALIGNANT, FEMALE BREAST NOS 10/15/2006  ? NEOP, MALIGNANT, 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  ? Ovarian cancer ([place])   ? Presence of cardiac pacemaker 04/25/2016  ? Red blood cell antibody positive   ? K  ? Seizures ([place])   ? Subdural hematoma ([facility])   ? SYNCOPE 07/01/2007  ? Wheezing 08/20/2008  ? WOUND, OPEN, LEG, WITHOUT COMPLICATION 07/27/3662  ? ?Past Surgical History:  ?Procedure Laterality Date  ? ABDOMINAL HYSTERECTOMY    ? APPENDECTOMY    ? BREAST RECONSTRUCTION    ? CARDIAC ELECTROPHYSIOLOGY MAPPING AND ABLATION    ? CESAREAN SECTION    ? CHOLECYSTECTOMY    ? COLON RESECTION  2005  ? COLON SURGERY  06/2020  ? COLONOSCOPY W/ BIOPSIES    ? FLEXIBLE SIGMOIDOSCOPY    ? LEFT AND RIGHT HEART CATHETERIZATION WITH CORONARY ANGIOGRAM N/A 10/07/2012  ? Procedure: LEFT AND RIGHT HEART CATHETERIZATION WITH CORONARY ANGIOGRAM;  Surgeon: Jolaine Artist, MD;  [facility] C[facility] LAB;  Service: Cardiovascular;  Laterality: N/A;  ? MASTECTOMY Bilateral   ? MINIMALLY INVASIVE TRICUSPID VALVE REPAIR    ? MITRAL VALVE REPAIR  2017  ? MITRAL VALVE REPLACEMENT    ? OOPHORECTOMY    ? SPIGELIAN HERNIA    ? ?Family History  ?Problem Relation Age of Onset  ? Heart disease Mother   ? Uterine cancer Mother   ? Ovarian cancer Sister   ? Pancreatic cancer Father   ? Melanoma Father   ? Diabetes Son   ? ?Social History  ? ?Socioeconomic History  ? Marital status: Married  ?  Spouse name: Not on file  ? Number of children: 4  ? Years of education: Not on file  ? Highest education level: Not on  file  ?Occupational History  ? Occupation: retired  ?Tobacco Use  ? Smoking status: Never  ? Smokeless tobacco: Never  ?Vaping Use  ? Vaping Use: Never used  ?Substance and Sexual Activity  ? Alcohol use: Not Currently  ?  Comment: social  ? Drug use: No  ? Sexual activity: Not Currently  ?Other Topics Concern  ? Not on file  ?Social History Narrative  ? Married second marriage   ? Lives in the [place] area with her husband  ? Has 7 dogs  ? Retired  ? 3 sons 1 daughter  ? Never smoker  ? No alcohol  ? 1 or 2 caffeinated beverages daily   ? no drug use  ? Right handed   ?   ?   ? ?Social Determinants of Health  ? ?Financial Resource Strain: Low Risk   ? Difficulty  of Paying Living Expenses: Not hard at all  ?Food Insecurity: No Food Insecurity  ? Worried About Charity fundraiser in the Last Year: Never true  ? Ran Out of Food in the Last Year: Never true  ?Transportation Needs: No Transportation Needs  ? Lack of Transportation (Medical): No  ? Lack of Transportation (Non-Medical): No  ?Physical Activity: Sufficiently Active  ? Days of Exercise per Week: 7 days  ? Minutes of Exercise per Session: 60 min  ?Stress: No Stress Concern Present  ? Feeling of Stress : Not at all  ?Social Connections: Socially Integrated  ? Frequency of Communication with Friends and Family: More than three times a week  ? Frequency of Social Gatherings with Friends and Family: More than three times a week  ? Attends Religious Services: More than 4 times per year  ? Active Member of Clubs or Organizations: Yes  ? Attends Archivist Meetings: More than 4 times per year  ? Marital Status: Married  ? ? ?Tobacco Counseling ?Counseling given: Not Answered ? ? ?Clinical Intake: ? ?Pre-visit preparation completed: Yes ? ?Pain : No/denies pain ? ?  ? ?Nutritional Risks: None ?Diabetes: No ? ?How often do you need to have someone help you when you read instructions, pamphlets, or other written materials from your doctor or pharmacy?: 1 -  Never ?What is the last grade level you completed in school?: [employer]; [employer]; some graduate classes ? ?Diabetic? no ? ?Interpreter Needed?: No ? ?Information entered by :: Lisette Abu, LP

## 2021-04-05 NOTE — Patient Instructions (Signed)
[name] , ?Thank you for taking time to come for your Medicare Wellness Visit. I appreciate your ongoing commitment to your health goals. Please review the following plan we discussed and let me know if I can assist you in the future.  ? ?Screening recommendations/referrals: ?Colonoscopy: 06/21/2020 at [facility]; no repeat recommended (per patient) ?Mammogram: declined; due to double mastectomy ?Bone Density: never done ?Recommended yearly ophthalmology/optometry visit for glaucoma screening and checkup ?Recommended yearly dental visit for hygiene and checkup ? ?Vaccinations: ?Influenza vaccine: declined ?Pneumococcal vaccine: declined ?Tdap vaccine: declined ?Shingles vaccine: declined   ?Covid-19: 07/15/2019 ([place]) ? ?Advanced directives: Advance directive discussed with you today. Even though you declined this today please call our office should you change your mind and we can give you the proper paperwork for you to fill out. ? ?Conditions/risks identified: Yes ? ?Next appointment: Please schedule your next Medicare Wellness Visit with your Nurse Health Advisor in 1 year or 366 days by calling [phone]. ? ? ?Preventive Care 67 Years and Older, Female ?Preventive care refers to lifestyle choices and visits with your health care provider that can promote health and wellness. ?What does preventive care include? ?A yearly physical exam. This is also called an annual well check. ?Dental exams once or twice a year. ?Routine eye exams. Ask your health care provider how often you should have your eyes checked. ?Personal lifestyle choices, including: ?Daily care of your teeth and gums. ?Regular physical activity. ?Eating a healthy diet. ?Avoiding tobacco and drug use. ?Limiting alcohol use. ?Practicing safe sex. ?Taking low-dose aspirin every day. ?Taking vitamin and mineral supplements as recommended by your health care provider. ?What happens during an annual well check? ?The services and  screenings done by your health care provider during your annual well check will depend on your age, overall health, lifestyle risk factors, and family history of disease. ?Counseling  ?Your health care provider may ask you questions about your: ?Alcohol use. ?Tobacco use. ?Drug use. ?Emotional well-being. ?Home and relationship well-being. ?Sexual activity. ?Eating habits. ?History of falls. ?Memory and ability to understand (cognition). ?Work and work Statistician. ?Reproductive health. ?Screening  ?You may have the following tests or measurements: ?Height, weight, and BMI. ?Blood pressure. ?Lipid and cholesterol levels. These may be checked every 5 years, or more frequently if you are over 39 years old. ?Skin check. ?Lung cancer screening. You may have this screening every year starting at age 49 if you have a 30-pack-year history of smoking and currently smoke or have quit within the past 15 years. ?Fecal occult blood test (FOBT) of the stool. You may have this test every year starting at age 47. ?Flexible sigmoidoscopy or colonoscopy. You may have a sigmoidoscopy every 5 years or a colonoscopy every 10 years starting at age 69. ?Hepatitis C blood test. ?Hepatitis B blood test. ?Sexually transmitted disease (STD) testing. ?Diabetes screening. This is done by checking your blood sugar (glucose) after you have not eaten for a while (fasting). You may have this done every 1-3 years. ?Bone density scan. This is done to screen for osteoporosis. You may have this done starting at age 22. ?Mammogram. This may be done every 1-2 years. Talk to your health care provider about how often you should have regular mammograms. ?Talk with your health care provider about your test results, treatment options, and if necessary, the need for more tests. ?Vaccines  ?Your health care provider may recommend certain vaccines, such as: ?Influenza vaccine. This is recommended every year. ?Tetanus,  diphtheria, and acellular pertussis (Tdap,  Td) vaccine. You may need a Td booster every 10 years. ?Zoster vaccine. You may need this after age 42. ?Pneumococcal 13-valent conjugate (PCV13) vaccine. One dose is recommended after age 53. ?Pneumococcal polysaccharide (PPSV23) vaccine. One dose is recommended after age 15. ?Talk to your health care provider about which screenings and vaccines you need and how often you need them. ?This information is not intended to replace advice given to you by your health care provider. Make sure you discuss any questions you have with your health care provider. ?Document Released: 01/15/2015 Document Revised: 09/08/2015 Document Reviewed: 10/20/2014 ?Elsevier Interactive Patient Education ? 2017 [place]. ? ?Fall Prevention in the Home ?Falls can cause injuries. They can happen to people of all ages. There are many things you can do to make your home safe and to help prevent falls. ?What can I do on the outside of my home? ?Regularly fix the edges of walkways and driveways and fix any cracks. ?Remove anything that might make you trip as you walk through a door, such as a raised step or threshold. ?Trim any bushes or trees on the path to your home. ?Use bright outdoor lighting. ?Clear any walking paths of anything that might make someone trip, such as rocks or tools. ?Regularly check to see if handrails are loose or broken. Make sure that both sides of any steps have handrails. ?Any raised decks and porches should have guardrails on the edges. ?Have any leaves, snow, or ice cleared regularly. ?Use sand or salt on walking paths during winter. ?Clean up any spills in your garage right away. This includes oil or grease spills. ?What can I do in the bathroom? ?Use night lights. ?Install grab bars by the toilet and in the tub and shower. Do not use towel bars as grab bars. ?Use non-skid mats or decals in the tub or shower. ?If you need to sit down in the shower, use a plastic, non-slip stool. ?Keep the floor dry. Clean up any  water that spills on the floor as soon as it happens. ?Remove soap buildup in the tub or shower regularly. ?Attach bath mats securely with double-sided non-slip rug tape. ?Do not have throw rugs and other things on the floor that can make you trip. ?What can I do in the bedroom? ?Use night lights. ?Make sure that you have a light by your bed that is easy to reach. ?Do not use any sheets or blankets that are too big for your bed. They should not hang down onto the floor. ?Have a firm chair that has side arms. You can use this for support while you get dressed. ?Do not have throw rugs and other things on the floor that can make you trip. ?What can I do in the kitchen? ?Clean up any spills right away. ?Avoid walking on wet floors. ?Keep items that you use a lot in easy-to-reach places. ?If you need to reach something above you, use a strong step stool that has a grab bar. ?Keep electrical cords out of the way. ?Do not use floor polish or wax that makes floors slippery. If you must use wax, use non-skid floor wax. ?Do not have throw rugs and other things on the floor that can make you trip. ?What can I do with my stairs? ?Do not leave any items on the stairs. ?Make sure that there are handrails on both sides of the stairs and use them. Fix handrails that are broken or loose. Make  sure that handrails are as long as the stairways. ?Check any carpeting to make sure that it is firmly attached to the stairs. Fix any carpet that is loose or worn. ?Avoid having throw rugs at the top or bottom of the stairs. If you do have throw rugs, attach them to the floor with carpet tape. ?Make sure that you have a light switch at the top of the stairs and the bottom of the stairs. If you do not have them, ask someone to add them for you. ?What else can I do to help prevent falls? ?Wear shoes that: ?Do not have high heels. ?Have rubber bottoms. ?Are comfortable and fit you well. ?Are closed at the toe. Do not wear sandals. ?If you use a  stepladder: ?Make sure that it is fully opened. Do not climb a closed stepladder. ?Make sure that both sides of the stepladder are locked into place. ?Ask someone to hold it for you, if possible. ?Clearly mark a

## 2021-04-07 ENCOUNTER — Ambulatory Visit: Payer: BC Managed Care – PPO | Admitting: Internal Medicine

## 2021-04-11 ENCOUNTER — Ambulatory Visit: Payer: BC Managed Care – PPO | Admitting: Internal Medicine

## 2021-04-18 ENCOUNTER — Other Ambulatory Visit: Payer: Self-pay | Admitting: Internal Medicine

## 2021-04-18 DIAGNOSIS — K580 Irritable bowel syndrome with diarrhea: Secondary | ICD-10-CM

## 2021-04-22 ENCOUNTER — Other Ambulatory Visit: Payer: Self-pay | Admitting: Internal Medicine

## 2021-04-22 NOTE — Telephone Encounter (Signed)
Please refill as per office routine med refill policy (all routine meds to be refilled for 3 mo or monthly (per pt preference) up to one year from last visit, then month to month grace period for 3 mo, then further med refills will have to be denied) ? ?

## 2021-04-25 ENCOUNTER — Other Ambulatory Visit: Payer: Self-pay

## 2021-04-25 ENCOUNTER — Inpatient Hospital Stay (HOSPITAL_COMMUNITY)
Admission: RE | Admit: 2021-04-25 | Discharge: 2021-04-29 | DRG: 884 | Disposition: A | Discharge status: Home / Self Care | Payer: BC Managed Care – PPO | Attending: Neurology | Admitting: Neurology

## 2021-04-25 ENCOUNTER — Encounter (HOSPITAL_COMMUNITY): Payer: Self-pay | Admitting: Neurology

## 2021-04-25 ENCOUNTER — Inpatient Hospital Stay (HOSPITAL_COMMUNITY): Payer: BC Managed Care – PPO

## 2021-04-25 DIAGNOSIS — Z8673 Personal history of transient ischemic attack (TIA), and cerebral infarction without residual deficits: Secondary | ICD-10-CM

## 2021-04-25 DIAGNOSIS — Z853 Personal history of malignant neoplasm of breast: Secondary | ICD-10-CM

## 2021-04-25 DIAGNOSIS — Z90710 Acquired absence of both cervix and uterus: Secondary | ICD-10-CM

## 2021-04-25 DIAGNOSIS — Z881 Allergy status to other antibiotic agents status: Secondary | ICD-10-CM

## 2021-04-25 DIAGNOSIS — Z91013 Allergy to seafood: Secondary | ICD-10-CM

## 2021-04-25 DIAGNOSIS — Z8249 Family history of ischemic heart disease and other diseases of the circulatory system: Secondary | ICD-10-CM | POA: Diagnosis not present

## 2021-04-25 DIAGNOSIS — Z8543 Personal history of malignant neoplasm of ovary: Secondary | ICD-10-CM

## 2021-04-25 DIAGNOSIS — E039 Hypothyroidism, unspecified: Secondary | ICD-10-CM | POA: Diagnosis not present

## 2021-04-25 DIAGNOSIS — Z950 Presence of cardiac pacemaker: Secondary | ICD-10-CM

## 2021-04-25 DIAGNOSIS — Z91040 Latex allergy status: Secondary | ICD-10-CM

## 2021-04-25 DIAGNOSIS — I421 Obstructive hypertrophic cardiomyopathy: Secondary | ICD-10-CM | POA: Diagnosis not present

## 2021-04-25 DIAGNOSIS — Z833 Family history of diabetes mellitus: Secondary | ICD-10-CM

## 2021-04-25 DIAGNOSIS — Z9013 Acquired absence of bilateral breasts and nipples: Secondary | ICD-10-CM

## 2021-04-25 DIAGNOSIS — E785 Hyperlipidemia, unspecified: Secondary | ICD-10-CM | POA: Diagnosis present

## 2021-04-25 DIAGNOSIS — N951 Menopausal and female climacteric states: Secondary | ICD-10-CM | POA: Diagnosis present

## 2021-04-25 DIAGNOSIS — Z952 Presence of prosthetic heart valve: Secondary | ICD-10-CM | POA: Diagnosis not present

## 2021-04-25 DIAGNOSIS — I442 Atrioventricular block, complete: Secondary | ICD-10-CM | POA: Diagnosis present

## 2021-04-25 DIAGNOSIS — I10 Essential (primary) hypertension: Secondary | ICD-10-CM | POA: Diagnosis present

## 2021-04-25 DIAGNOSIS — R569 Unspecified convulsions: Principal | ICD-10-CM

## 2021-04-25 DIAGNOSIS — R404 Transient alteration of awareness: Secondary | ICD-10-CM | POA: Diagnosis not present

## 2021-04-25 DIAGNOSIS — I480 Paroxysmal atrial fibrillation: Secondary | ICD-10-CM | POA: Diagnosis not present

## 2021-04-25 DIAGNOSIS — Z86718 Personal history of other venous thrombosis and embolism: Secondary | ICD-10-CM

## 2021-04-25 DIAGNOSIS — Z9049 Acquired absence of other specified parts of digestive tract: Secondary | ICD-10-CM | POA: Diagnosis not present

## 2021-04-25 DIAGNOSIS — Z8041 Family history of malignant neoplasm of ovary: Secondary | ICD-10-CM

## 2021-04-25 DIAGNOSIS — I472 Ventricular tachycardia, unspecified: Secondary | ICD-10-CM | POA: Diagnosis not present

## 2021-04-25 DIAGNOSIS — F419 Anxiety disorder, unspecified: Secondary | ICD-10-CM | POA: Diagnosis present

## 2021-04-25 DIAGNOSIS — Z8049 Family history of malignant neoplasm of other genital organs: Secondary | ICD-10-CM

## 2021-04-25 DIAGNOSIS — R0789 Other chest pain: Secondary | ICD-10-CM

## 2021-04-25 DIAGNOSIS — Z7901 Long term (current) use of anticoagulants: Secondary | ICD-10-CM

## 2021-04-25 DIAGNOSIS — I459 Conduction disorder, unspecified: Secondary | ICD-10-CM | POA: Diagnosis present

## 2021-04-25 DIAGNOSIS — I340 Nonrheumatic mitral (valve) insufficiency: Secondary | ICD-10-CM | POA: Diagnosis not present

## 2021-04-25 DIAGNOSIS — Z808 Family history of malignant neoplasm of other organs or systems: Secondary | ICD-10-CM

## 2021-04-25 DIAGNOSIS — Z888 Allergy status to other drugs, medicaments and biological substances status: Secondary | ICD-10-CM

## 2021-04-25 DIAGNOSIS — Z954 Presence of other heart-valve replacement: Secondary | ICD-10-CM | POA: Diagnosis not present

## 2021-04-25 DIAGNOSIS — Z86010 Personal history of colonic polyps: Secondary | ICD-10-CM

## 2021-04-25 DIAGNOSIS — Z880 Allergy status to penicillin: Secondary | ICD-10-CM

## 2021-04-25 DIAGNOSIS — Z79890 Hormone replacement therapy (postmenopausal): Secondary | ICD-10-CM

## 2021-04-25 DIAGNOSIS — Z79899 Other long term (current) drug therapy: Secondary | ICD-10-CM

## 2021-04-25 DIAGNOSIS — Z800 Family history of malignant neoplasm of digestive organs: Secondary | ICD-10-CM

## 2021-04-25 DIAGNOSIS — I4729 Other ventricular tachycardia: Secondary | ICD-10-CM | POA: Diagnosis not present

## 2021-04-25 LAB — COMPREHENSIVE METABOLIC PANEL
ALT: 22 U/L (ref 0–44)
AST: 27 U/L (ref 15–41)
Albumin: 4.1 g/dL (ref 3.5–5.0)
Alkaline Phosphatase: 118 U/L (ref 38–126)
Anion gap: 7 (ref 5–15)
BUN: 14 mg/dL (ref 8–23)
CO2: 24 mmol/L (ref 22–32)
Calcium: 9 mg/dL (ref 8.9–10.3)
Chloride: 108 mmol/L (ref 98–111)
Creatinine, Ser: 0.98 mg/dL (ref 0.44–1.00)
GFR, Estimated: >60 mL/min (ref >60)
Glucose, Bld: 97 mg/dL (ref 70–99)
Potassium: 4 mmol/L (ref 3.5–5.1)
Sodium: 139 mmol/L (ref 135–145)
Total Bilirubin: 0.6 mg/dL (ref 0.3–1.2)
Total Protein: 6.7 g/dL (ref 6.5–8.1)

## 2021-04-25 LAB — CBC WITH DIFFERENTIAL/PLATELET
Abs Immature Granulocytes: 0.01 10*3/uL (ref 0.00–0.07)
Basophils Absolute: 0 10*3/uL (ref 0.0–0.1)
Basophils Relative: 1 %
Eosinophils Absolute: 0.3 10*3/uL (ref 0.0–0.5)
Eosinophils Relative: 5 %
HCT: 40.3 % (ref 36.0–46.0)
Hemoglobin: 13.4 g/dL (ref 12.0–15.0)
Immature Granulocytes: 0 %
Lymphocytes Relative: 21 %
Lymphs Abs: 1.2 10*3/uL (ref 0.7–4.0)
MCH: 28.2 pg (ref 26.0–34.0)
MCHC: 33.3 g/dL (ref 30.0–36.0)
MCV: 84.7 fL (ref 80.0–100.0)
Monocytes Absolute: 0.4 10*3/uL (ref 0.1–1.0)
Monocytes Relative: 6 %
Neutro Abs: 3.8 10*3/uL (ref 1.7–7.7)
Neutrophils Relative %: 67 %
Platelets: 238 10*3/uL (ref 150–400)
RBC: 4.76 MIL/uL (ref 3.87–5.11)
RDW: 14.1 % (ref 11.5–15.5)
WBC: 5.7 10*3/uL (ref 4.0–10.5)
nRBC: 0 % (ref 0.0–0.2)

## 2021-04-25 LAB — PHOSPHORUS: Phosphorus: 3.3 mg/dL (ref 2.5–4.6)

## 2021-04-25 LAB — PROTIME-INR
INR: 1.9 — ABNORMAL HIGH (ref 0.8–1.2)
Prothrombin Time: 21.6 seconds — ABNORMAL HIGH (ref 11.4–15.2)

## 2021-04-25 LAB — MAGNESIUM: Magnesium: 2 mg/dL (ref 1.7–2.4)

## 2021-04-25 LAB — RAPID URINE DRUG SCREEN, HOSP PERFORMED
Amphetamines: NOT DETECTED
Barbiturates: NOT DETECTED
Benzodiazepines: NOT DETECTED
Cocaine: NOT DETECTED
Opiates: POSITIVE — AB
Tetrahydrocannabinol: NOT DETECTED

## 2021-04-25 LAB — HEMOGLOBIN A1C
Hgb A1c MFr Bld: 5.1 % (ref 4.8–5.6)
Mean Plasma Glucose: 99.67 mg/dL

## 2021-04-25 LAB — GLUCOSE, CAPILLARY: Glucose-Capillary: 186 mg/dL — ABNORMAL HIGH (ref 70–99)

## 2021-04-25 MED ORDER — LORAZEPAM 2 MG/ML IJ SOLN: 2.0000 mg | INTRAMUSCULAR | Status: DC | PRN

## 2021-04-25 MED ORDER — WARFARIN SODIUM 5 MG PO TABS: 5.0000 mg | ORAL_TABLET | ORAL | Status: DC

## 2021-04-25 MED ORDER — WARFARIN - PHYSICIAN DOSING INPATIENT: Freq: Every day | Status: DC

## 2021-04-25 MED ORDER — WARFARIN SODIUM 5 MG PO TABS: 10.0000 mg | ORAL_TABLET | Freq: Every day | ORAL | Status: DC

## 2021-04-25 MED ORDER — VENLAFAXINE HCL ER 75 MG PO CP24
150.0000 mg | ORAL_CAPSULE | Freq: Every day | ORAL | Status: DC
  Administered 2021-04-26 – 2021-04-29 (×4): 150 mg via ORAL
  Filled 2021-04-25 (×4): qty 2

## 2021-04-25 MED ORDER — ACETAMINOPHEN 650 MG RE SUPP: 650.0000 mg | RECTAL | Status: DC | PRN

## 2021-04-25 MED ORDER — ACETAMINOPHEN 325 MG PO TABS
650.0000 mg | ORAL_TABLET | ORAL | Status: DC | PRN
  Administered 2021-04-26: 650 mg via ORAL
  Filled 2021-04-25: qty 2

## 2021-04-25 MED ORDER — SODIUM CHLORIDE 0.9% FLUSH
3.0000 mL | Freq: Two times a day (BID) | INTRAVENOUS | Status: DC
  Administered 2021-04-25 – 2021-04-28 (×8): 3 mL via INTRAVENOUS

## 2021-04-25 MED ORDER — WARFARIN SODIUM 5 MG PO TABS
5.0000 mg | ORAL_TABLET | ORAL | Status: DC
  Administered 2021-04-25: 5 mg via ORAL
  Filled 2021-04-25: qty 1

## 2021-04-25 MED ORDER — ENOXAPARIN SODIUM 40 MG/0.4ML IJ SOSY
40.0000 mg | PREFILLED_SYRINGE | Freq: Every day | INTRAMUSCULAR | Status: DC
  Filled 2021-04-25: qty 0.4

## 2021-04-25 MED ORDER — METOPROLOL TARTRATE 50 MG PO TABS: 50.0000 mg | ORAL_TABLET | Freq: Every day | ORAL | Status: DC

## 2021-04-25 MED ORDER — LEVOTHYROXINE SODIUM 50 MCG PO TABS
50.0000 ug | ORAL_TABLET | Freq: Every day | ORAL | Status: DC
  Administered 2021-04-26 – 2021-04-29 (×4): 50 ug via ORAL
  Filled 2021-04-25 (×4): qty 1

## 2021-04-25 MED ORDER — WARFARIN SODIUM 5 MG PO TABS: 10.0000 mg | ORAL_TABLET | ORAL | Status: DC

## 2021-04-25 MED ORDER — LABETALOL HCL 5 MG/ML IV SOLN
5.0000 mg | INTRAVENOUS | Status: DC | PRN
  Administered 2021-04-27 – 2021-04-28 (×2): 5 mg via INTRAVENOUS
  Filled 2021-04-25 (×3): qty 4

## 2021-04-25 MED ORDER — WARFARIN SODIUM 5 MG PO TABS
10.0000 mg | ORAL_TABLET | ORAL | Status: DC
  Administered 2021-04-26: 10 mg via ORAL
  Filled 2021-04-25: qty 2

## 2021-04-25 MED ORDER — METOPROLOL SUCCINATE ER 50 MG PO TB24
50.0000 mg | ORAL_TABLET | Freq: Every day | ORAL | Status: DC
  Administered 2021-04-26 – 2021-04-29 (×4): 50 mg via ORAL
  Filled 2021-04-25 (×4): qty 1

## 2021-04-25 NOTE — H&P (Addendum)
CC: seizure ? ?History is obtained from:  ? ?HPI: [name] is a 66 y.o. female with past medical history of hypertension, hyperlipidemia, hypothyroidism, complete heart block who is admitted to epilepsy monitoring unit for characterization of spells. ? ?History of spells: Patient states she started having the spells in 2018 after "open heart surgery". Describes episodes as vision disturbance with eyes moving in different directions (daughter once saw that one of her eye was looking up and other eye was down).  At the beginning of the episode she can understand people but cannot speak soon she is unable to understand.  If she is walking she will start walking like "C" instead of a straight line like "I", can turn either left or right.  Denies any focal weakness, headache.  States episodes can be triggered if she is looking down and performing a task like vacuuming.  Also reports episodes can worsen due to stress.  States the longest she has gone without an episode is 3 months but usually happen about once a week.  ? ?Current AEDs: Zonisamide '500mg'$  qhs ? ?Prior AEDs: Topamax, Diamox ? ?Epilepsy risk factors: Denies history of febrile seizures, meningitis/encephalitis, head injury with loss of consciousness, family history of epilepsy ? ?Routine EEG 07/05/2018: Normal ?EMU October 2020: Normal.  Two events were recorded without EEG change.  During first event patient was exercising with RN repeatedly bending over and felt her face was becoming flushed and had "weird" feeling in her chest.  During the second episode she was exercising with RN and felt like an event was starting. ? ?ROS: All other systems reviewed and negative except as noted in the HPI.  ? ?Past Medical History:  ?Diagnosis Date  ? Abdominal pain, epigastric 10/25/2007  ? ADD 01/20/2009  ? AKI (acute kidney injury) ([place]) 04/11/2016  ? ALLERGIC RHINITIS 10/15/2006  ? ANGIOEDEMA 08/12/2008  ? ANXIETY 10/15/2006  ? ASTHMATIC BRONCHITIS, ACUTE 01/27/2008  ?  Bowel obstruction ([place])   ? Breast cancer ([place])   ? Cellulitis and abscess of leg, except foot 08/12/2007  ? CELLULITIS, [place] 12/21/2008  ? CHB (complete heart block) ([place]) 04/14/2016  ? CHEST PAIN 06/14/2007  ? Chronic anticoagulation 05/16/2016  ? Chronic sinus infection 04/12/2010  ? COLECTOMY, PARTIAL, WITH ANASTOMOSIS, HX OF 10/15/2006  ? COLONIC POLYPS, HX OF 10/15/2006  ? CYST, OVARIAN NEC/NOS 10/15/2006  ? Dengue 02/22/2010  ? DVT, HX OF 10/15/2006  ? DYSAUTONOMIA 10/15/2006  ? Dysfunction of eustachian tube 05/26/2009  ? GI BLEEDING 04/04/2007  ? Glossitis 03/06/2008  ? HEADACHE, CHRONIC 04/04/2007  ? Heart murmur   ? Hematemesis 10/25/2007  ? HEMORRHOIDS 04/04/2007  ? HYPERLIPIDEMIA 12/12/2007  ? HYPERTENSION 10/15/2006  ? Hypertrophic cardiomyopathy ([place])   ? HYPOTHYROIDISM 03/06/2008  ? IBS 04/04/2007  ? MASTECTOMY, BILATERAL, HX OF 08/12/2009  ? NECK MASS 07/01/2007  ? NEOP, MALIGNANT, FEMALE BREAST NOS 10/15/2006  ? NEOP, MALIGNANT, 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  ? Ovarian cancer ([place])   ? Presence of cardiac pacemaker 04/25/2016  ? Red blood cell antibody positive   ? K  ? Seizures ([place])   ? Subdural hematoma ([facility])   ? SYNCOPE 07/01/2007  ? Wheezing 08/20/2008  ? WOUND, OPEN, LEG, WITHOUT COMPLICATION 2/56/3893  ? ?Family History  ?Problem Relation Age of Onset  ? Heart disease Mother   ? Uterine cancer Mother   ? Ovarian cancer Sister   ? Pancreatic cancer Father   ? Melanoma Father   ? Diabetes Son   ? ? ?Social  History:  reports that she has never smoked. She has never used smokeless tobacco. She reports that she does not currently use alcohol. She reports that she does not use drugs. ? ? ?Medications Prior to Admission  ?Medication Sig Dispense Refill Last Dose  ? acetaminophen-codeine (TYLENOL #3) 300-30 MG tablet TAKE 1 TABLET BY MOUTH FOUR TIMES DAILY AS NEEDED FOR PAIN 120 tablet 2   ? albuterol (VENTOLIN HFA) 108 (90 Base) MCG/ACT inhaler Inhale 2 puffs into the lungs every 6 (six) hours as needed for wheezing or shortness of  breath. 8 g 5   ? levothyroxine (SYNTHROID) 50 MCG tablet TAKE 1 TABLET(50 MCG) BY MOUTH EVERY DAY 90 tablet 0   ? metoprolol tartrate (LOPRESSOR) 50 MG tablet Take by mouth.     ? ondansetron (ZOFRAN) 4 MG tablet Take 1 tablet (4 mg total) by mouth every 8 (eight) hours as needed for nausea or vomiting. 30 tablet 0   ? ondansetron (ZOFRAN-ODT) 8 MG disintegrating tablet Take 1 tablet (8 mg total) by mouth every 8 (eight) hours as needed for nausea. 30 tablet 0   ? venlafaxine XR (EFFEXOR-XR) 150 MG 24 hr capsule Take 1 capsule (150 mg total) by mouth daily. 90 capsule 3   ? vitamin C (ASCORBIC ACID) 500 MG tablet Take 500 mg by mouth daily.      ? warfarin (COUMADIN) 10 MG tablet Take 1 tablet daily or as directed by anticoagulation clinic (Patient taking differently: Take 10 mg by mouth at bedtime.) 90 tablet 0   ? zolpidem (AMBIEN) 10 MG tablet TAKE 1 TABLET BY MOUTH EVERY DAY AT BEDTIME AS NEEDED 90 tablet 1   ? zonisamide (ZONEGRAN) 100 MG capsule Take 5 capsules every night 150 capsule 6   ?  ? ? ?Exam: ?Current vital signs: ?BP (!) 164/96 (BP Location: Left Arm)   Pulse 68   Temp 98.2 ?F (36.8 ?C) (Oral)   Resp 18   SpO2 99%  ?Vital signs in last 24 hours: ?Temp:  [98.2 ?F (36.8 ?C)] 98.2 ?F (36.8 ?C) (04/24 0900) ?Pulse Rate:  [68] 68 (04/24 0900) ?Resp:  [18] 18 (04/24 0900) ?BP: (164)/(96) 164/96 (04/24 0900) ?SpO2:  [99 %] 99 % (04/24 0900) ? ? ?Physical Exam  ?Constitutional: Appears well-developed and well-nourished.  ?Psych: Affect appropriate to situation ?Eyes: No scleral injection ?HENT: No OP obstrucion ?Head: Normocephalic.  ?Neuro: AOX3, CN 2-12 grossly intact, spontaneously moving all extremities ? ?I have reviewed labs in epic and the results pertinent to this consultation are: ?CBC: No results for input(s): WBC, NEUTROABS, HGB, HCT, MCV, PLT in the last 168 hours. ? ?Basic Metabolic Panel:  ?Lab Results  ?Component Value Date  ? NA 137 02/06/2019  ? K 4.2 02/06/2019  ? CO2 23 02/06/2019  ?  GLUCOSE 152 (H) 02/06/2019  ? BUN 23 02/06/2019  ? CREATININE 1.01 (H) 02/06/2019  ? CALCIUM 8.9 02/06/2019  ? GFRNONAA 59 (L) 02/06/2019  ? GFRAA 68 02/06/2019  ? ?Lipid Panel:  ?Lab Results  ?Component Value Date  ? [place] 73 02/11/2008  ? ?HgbA1c:  ?Lab Results  ?Component Value Date  ? HGBA1C  08/12/2007  ?  5.2 ?(NOTE)   The ADA recommends the following therapeutic goal for glycemic   control related to Hgb A1C measurement:   Goal of Therapy:   < 7.0% Hgb A1C   Reference: American Diabetes Association: Clinical Practice   Recommendations 2008, Diabetes Care,  ?2008, 31:(Suppl 1).  ? ?Urine Drug Screen:  No results found for: LABOPIA, COCAINSCRNUR, [place], [place], [place], [place]  ?Alcohol Level No results found for: ETH ? ? ?I have reviewed the images obtained: ? ?MR Brain wo contrast 08/01/2018: No change. Stable amount and appearance of subdural blood along the floor of the posterior fossa and the ventral foramen magnum and upper spinal canal. ? ?ASSESSMENT/PLAN: 66 year old female admitted to epilepsy monitoring unit for characterization of seizure-like episodes. ? ?Transient neurological deficits ?-Obtain video EEG monitoring for characterization of spells ?- Hold zonisamide tonight ?-Sleep deprivation tonight ?-As needed IV Ativan for clinical seizure-like activity ?-Seizure precautions ? ?Mechanical MVR with afib ?- Continue Coumadin as prescribed ? ?Post menopausal hot flashes ?-Continue Effexor as prescribed ? ?Hypothyroidism ?-Continue levothyroxine ? ?Hypertension ?-Continue metoprolol ? ? ?Zeb Comfort ?Epilepsy ?[facility] ? ? ? ? ?

## 2021-04-25 NOTE — Progress Notes (Signed)
?  Transition of Care (TOC) Screening Note ? ? ?Patient Details  ?Name: [name] ?Date of Birth: [birth year] ? ? ?Transition of Care (TOC) CM/SW Contact:    ?Pollie Friar, RN ?Phone Number: ?04/25/2021, 1:00 PM ? ? ? ?Transition of Care Department [facility]) has reviewed patient and no TOC needs have been identified at this time. We will continue to monitor patient advancement through interdisciplinary progression rounds. If new patient transition needs arise, please place a TOC consult. ?  ?

## 2021-04-25 NOTE — Progress Notes (Signed)
LTM EEG hooked up and running - no initial skin breakdown - push button tested - neuro notified. [facility] monitoring.  

## 2021-04-26 DIAGNOSIS — R569 Unspecified convulsions: Secondary | ICD-10-CM | POA: Diagnosis not present

## 2021-04-26 LAB — HIV ANTIBODY (ROUTINE TESTING W REFLEX): HIV Screen 4th Generation wRfx: NONREACTIVE

## 2021-04-26 MED ORDER — WARFARIN - PHARMACIST DOSING INPATIENT: Freq: Every day | Status: DC

## 2021-04-26 NOTE — Progress Notes (Signed)
Pt had a quiet night rest, no seizure like activity observed, pt reassured and v/s stable. [place], [place] ? ?

## 2021-04-26 NOTE — Progress Notes (Addendum)
Subjective: No acute events overnight ? ?ROS: negative except above ? ?Examination ? ?Vital signs in last 24 hours: ?Temp:  [98.1 ?F (36.7 ?C)-98.8 ?F (37.1 ?C)] 98.6 ?F (37 ?C) (04/25 0827) ?Pulse Rate:  [65-79] 68 (04/25 0742) ?Resp:  [16-18] 16 (04/25 0742) ?BP: (128-157)/(71-103) 149/89 (04/25 0827) ?SpO2:  [95 %-98 %] 96 % (04/25 0827) ? ?General: lying in bed, NAD ?Neuro:AOX3, CN 2-12 grossly intact, spontaneously moving all extremities ?  ?Basic Metabolic Panel: ?Recent Labs  ?Lab 04/25/21 ?1102  ?NA 139  ?K 4.0  ?CL 108  ?CO2 24  ?GLUCOSE 97  ?BUN 14  ?CREATININE 0.98  ?CALCIUM 9.0  ?MG 2.0  ?PHOS 3.3  ? ? ?CBC: ?Recent Labs  ?Lab 04/25/21 ?1102  ?WBC 5.7  ?NEUTROABS 3.8  ?HGB 13.4  ?HCT 40.3  ?MCV 84.7  ?PLT 238  ? ? ? ?Coagulation Studies: ?Recent Labs  ?  04/25/21 ?1102  ?LABPROT 21.6*  ?INR 1.9*  ? ? ?Imaging ?No new brain imaging overnight ? ?ASSESSMENT AND PLAN: 66 year old female admitted to epilepsy monitoring unit for characterization of seizure-like episodes. ?  ?Transient neurological deficits ?-Continue video EEG monitoring for characterization of spells ?- Hold zonisamide  ?-HV and photic stimulation ?-As needed IV Ativan for clinical seizure-like activity ?-Seizure precautions ?  ?Mechanical MVR with afib ?- Continue Coumadin as prescribed ?- Pharmacy consult ?  ?Post menopausal hot flashes ?-Continue Effexor as prescribed ?  ?Hypothyroidism ?-Continue levothyroxine ? ?Hypertension ?-Continue metoprolol ?  ? ?I have spent a total of  36  minutes with the patient reviewing hospital notes,  test results, labs and examining the patient as well as establishing an assessment and plan that was discussed personally with the patient.  > 50% of time was spent in direct patient care. ?  ?Zeb Comfort ?Epilepsy ?[facility] ?For questions after 5pm please refer to AMION to reach the Neurologist on call ? ?

## 2021-04-26 NOTE — Progress Notes (Signed)
EEG maintenance performed.  No skin breakdown observed at electrode sites P3, Pz, Pz, F4.  Reapplied EKG leads.   ?Performed activation procedures: photic stim, HV. ?

## 2021-04-26 NOTE — Progress Notes (Signed)
ANTICOAGULATION CONSULT NOTE - Initial Consult ? ?Pharmacy Consult for warfarin ?Indication: mechanical MVR with afib ? ?Allergies  ?Allergen Reactions  ? Fish Allergy Anaphylaxis and Swelling  ?  Scaled fish, like flounder  ? Penicillins Anaphylaxis, Hives and Rash  ? Levofloxacin In D5w Nausea And Vomiting and Swelling  ?  Causes throat swelling  ? Nitroglycerin Other (See Comments)  ?  Loss of consciousness ?  ? Pneumovax [Pneumococcal Polysaccharide Vaccine] Other (See Comments)  ?  Rash, arm swelling  ? Sulfamethoxazole   ?  Other reaction(s): GI bleeding ?Other reaction(s): Blood Disorder  ? Latex Dermatitis and Rash  ?  If worn for more than a day, this causes blisters ?  ? ? ?Patient Measurements: ?Height: '5\' 9"'$  (175.3 cm) ?Weight:  (declined) ?IBW/kg (Calculated) : 66.2 ? ?Vital Signs: ?Temp: 98.3 ?F (36.8 ?C) (04/25 1218) ?Temp Source: Oral (04/25 1218) ?BP: 137/82 (04/25 1218) ?Pulse Rate: 71 (04/25 1218) ? ?Labs: ?Recent Labs  ?  04/25/21 ?1102  ?HGB 13.4  ?HCT 40.3  ?PLT 238  ?LABPROT 21.6*  ?INR 1.9*  ?CREATININE 0.98  ? ? ?Estimated Creatinine Clearance: 67.7 mL/min (by C-G formula based on SCr of 0.98 mg/dL). ? ? ?Medical History: ?Past Medical History:  ?Diagnosis Date  ? Abdominal pain, epigastric 10/25/2007  ? ADD 01/20/2009  ? AKI (acute kidney injury) ([place]) 04/11/2016  ? ALLERGIC RHINITIS 10/15/2006  ? ANGIOEDEMA 08/12/2008  ? ANXIETY 10/15/2006  ? ASTHMATIC BRONCHITIS, ACUTE 01/27/2008  ? Bowel obstruction ([place])   ? Breast cancer ([place])   ? Cellulitis and abscess of leg, except foot 08/12/2007  ? CELLULITIS, [place] 12/21/2008  ? CHB (complete heart block) ([place]) 04/14/2016  ? CHEST PAIN 06/14/2007  ? Chronic anticoagulation 05/16/2016  ? Chronic sinus infection 04/12/2010  ? COLECTOMY, PARTIAL, WITH ANASTOMOSIS, HX OF 10/15/2006  ? COLONIC POLYPS, HX OF 10/15/2006  ? CYST, OVARIAN NEC/NOS 10/15/2006  ? Dengue 02/22/2010  ? DVT, HX OF 10/15/2006  ? DYSAUTONOMIA 10/15/2006  ? Dysfunction of eustachian tube  05/26/2009  ? GI BLEEDING 04/04/2007  ? Glossitis 03/06/2008  ? HEADACHE, CHRONIC 04/04/2007  ? Heart murmur   ? Hematemesis 10/25/2007  ? HEMORRHOIDS 04/04/2007  ? HYPERLIPIDEMIA 12/12/2007  ? HYPERTENSION 10/15/2006  ? Hypertrophic cardiomyopathy ([place])   ? HYPOTHYROIDISM 03/06/2008  ? IBS 04/04/2007  ? MASTECTOMY, BILATERAL, HX OF 08/12/2009  ? NECK MASS 07/01/2007  ? NEOP, MALIGNANT, FEMALE BREAST NOS 10/15/2006  ? NEOP, MALIGNANT, 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  ? Ovarian cancer ([place])   ? Presence of cardiac pacemaker 04/25/2016  ? Red blood cell antibody positive   ? K  ? Seizures ([place])   ? Subdural hematoma ([facility])   ? SYNCOPE 07/01/2007  ? Wheezing 08/20/2008  ? WOUND, OPEN, LEG, WITHOUT COMPLICATION 2/68/3419  ? ? ?Medications:  ?Medications Prior to Admission  ?Medication Sig Dispense Refill Last Dose  ? acetaminophen-codeine (TYLENOL #3) 300-30 MG tablet TAKE 1 TABLET BY MOUTH FOUR TIMES DAILY AS NEEDED FOR PAIN (Patient taking differently: Take 1 tablet by mouth 4 (four) times daily as needed for moderate pain.) 120 tablet 2 04/25/2021  ? albuterol (VENTOLIN HFA) 108 (90 Base) MCG/ACT inhaler Inhale 2 puffs into the lungs every 6 (six) hours as needed for wheezing or shortness of breath. 8 g 5 Past Month  ? levothyroxine (SYNTHROID) 50 MCG tablet TAKE 1 TABLET(50 MCG) BY MOUTH EVERY DAY (Patient taking differently: Take 50 mcg by mouth daily before breakfast.) 90 tablet 0 04/25/2021  ? metoprolol succinate (TOPROL-XL) 50  MG 24 hr tablet Take 50 mg by mouth daily.   04/25/2021 at 0700  ? Multiple Vitamins-Minerals (ZINC PO) Take 1 capsule by mouth daily.   04/24/2021  ? ondansetron (ZOFRAN-ODT) 8 MG disintegrating tablet Take 1 tablet (8 mg total) by mouth every 8 (eight) hours as needed for nausea. 30 tablet 0 Past Month  ? venlafaxine XR (EFFEXOR-XR) 150 MG 24 hr capsule Take 1 capsule (150 mg total) by mouth daily. 90 capsule 3 04/25/2021  ? vitamin C (ASCORBIC ACID) 500 MG tablet Take 500 mg by mouth daily.    04/24/2021  ? VITAMIN D PO  Take 1 tablet by mouth daily.   04/24/2021  ? warfarin (COUMADIN) 5 MG tablet Take 5-10 mg by mouth daily. Take '5mg'$  by mouth on SAT MON WED FRI in the evening, then take '10mg'$  on SUN TUE THU.   04/24/2021 at 2200  ? zonisamide (ZONEGRAN) 100 MG capsule Take 5 capsules every night (Patient taking differently: Take 500 mg by mouth at bedtime.) 150 capsule 6 04/24/2021  ? ondansetron (ZOFRAN) 4 MG tablet Take 1 tablet (4 mg total) by mouth every 8 (eight) hours as needed for nausea or vomiting. (Patient not taking: Reported on 04/25/2021) 30 tablet 0 Not Taking  ? warfarin (COUMADIN) 10 MG tablet Take 1 tablet daily or as directed by anticoagulation clinic (Patient not taking: Reported on 04/25/2021) 90 tablet 0 Not Taking  ? zolpidem (AMBIEN) 10 MG tablet TAKE 1 TABLET BY MOUTH EVERY DAY AT BEDTIME AS NEEDED (Patient not taking: Reported on 04/25/2021) 90 tablet 1 Not Taking  ? ? ?Assessment: ?Patient is a 66yo F admitted to the epilepsy monitoring unit for characterization of seizure spells.  She is on warfarin PTA for hx mechanical MVR, afib, and hx DVT.  Pharmacy has been consulted to manage warfarin dosing.  INR 4/24 was 1.9 and pt received home dose of '5mg'$  last evening. ? ?PTA regimen warfarin '10mg'$  TuThSun and '5mg'$  all other days.  Pt is due for higher dose this evening.  Will continue. ? ?Goal of Therapy:  ?INR 2.5-3.5 ?Monitor platelets by anticoagulation protocol: Yes ?  ?Plan:  ?Continue warfarin '10mg'$  TuThSun and '5mg'$  all other days as ordered. ?INR in AM. ? ?[employer], Pharm.D., BCPS ?Clinical Pharmacist ? ?**Pharmacist phone directory can be found on [email or web address] listed under [place]. ? ?04/26/2021 1:46 PM ? ? ? ?

## 2021-04-26 NOTE — Procedures (Signed)
Patient Name: [name]  ?MRN: [id number]  ?Epilepsy Attending: Lora Havens  ?Referring Physician/Provider: Lora Havens, MD ?Duration: 04/25/2021 6237 to 04/26/2021 6283 ? ?Patient history: 67 year old female admitted to epilepsy monitoring unit for characterization of seizure-like episodes.  EEG for characterization of spells ? ?Level of alertness: Awake, asleep ? ?AEDs during EEG study: None ? ?Technical aspects: This EEG study was done with scalp electrodes positioned according to the 10-20 International system of electrode placement. Electrical activity was acquired at a sampling rate of '500Hz'$  and reviewed with a high frequency filter of '70Hz'$  and a low frequency filter of '1Hz'$ . EEG data were recorded continuously and digitally stored.  ? ?Description: The posterior dominant rhythm consists of 9 Hz activity of moderate voltage (25-35 uV) seen predominantly in posterior head regions, symmetric and reactive to eye opening and eye closing. Sleep was characterized by vertex waves, sleep spindles (12 to 14 Hz), maximal frontocentral region. Hyperventilation and photic stimulation were not performed.    ? ?IMPRESSION: ?This study is within normal limits. No seizures or epileptiform discharges were seen throughout the recording. ? ?Lora Havens  ? ?

## 2021-04-27 DIAGNOSIS — Z950 Presence of cardiac pacemaker: Secondary | ICD-10-CM

## 2021-04-27 DIAGNOSIS — I421 Obstructive hypertrophic cardiomyopathy: Secondary | ICD-10-CM

## 2021-04-27 DIAGNOSIS — R569 Unspecified convulsions: Secondary | ICD-10-CM

## 2021-04-27 DIAGNOSIS — Z952 Presence of prosthetic heart valve: Secondary | ICD-10-CM

## 2021-04-27 DIAGNOSIS — I480 Paroxysmal atrial fibrillation: Secondary | ICD-10-CM

## 2021-04-27 DIAGNOSIS — R0789 Other chest pain: Secondary | ICD-10-CM

## 2021-04-27 DIAGNOSIS — I4729 Other ventricular tachycardia: Secondary | ICD-10-CM

## 2021-04-27 LAB — TROPONIN I (HIGH SENSITIVITY): Troponin I (High Sensitivity): 12 ng/L (ref <18)

## 2021-04-27 LAB — PROTIME-INR
INR: 2 — ABNORMAL HIGH (ref 0.8–1.2)
Prothrombin Time: 22.4 seconds — ABNORMAL HIGH (ref 11.4–15.2)

## 2021-04-27 MED ORDER — ACETAMINOPHEN-CODEINE #3 300-30 MG PO TABS
1.0000 | ORAL_TABLET | Freq: Four times a day (QID) | ORAL | Status: DC | PRN
Start: 2021-04-27 — End: 2021-04-29
  Administered 2021-04-27 – 2021-04-29 (×6): 1 via ORAL
  Filled 2021-04-27 (×6): qty 1

## 2021-04-27 MED ORDER — WARFARIN SODIUM 5 MG PO TABS
10.0000 mg | ORAL_TABLET | Freq: Once | ORAL | Status: AC
Start: 2021-04-27 — End: 2021-04-27
  Administered 2021-04-27: 10 mg via ORAL
  Filled 2021-04-27: qty 2

## 2021-04-27 NOTE — Procedures (Signed)
Patient Name: [name]  ?MRN: [id number]  ?Epilepsy Attending: Lora Havens  ?Referring Physician/Provider: Lora Havens, MD ?Duration: 04/26/2021 6213 to 04/27/2021 0865 ?  ?Patient history: 66 year old female admitted to epilepsy monitoring unit for characterization of seizure-like episodes.  EEG for characterization of spells ?  ?Level of alertness: Awake, asleep ?  ?AEDs during EEG study: None ?  ?Technical aspects: This EEG study was done with scalp electrodes positioned according to the 10-20 International system of electrode placement. Electrical activity was acquired at a sampling rate of '500Hz'$  and reviewed with a high frequency filter of '70Hz'$  and a low frequency filter of '1Hz'$ . EEG data were recorded continuously and digitally stored.  ?  ?Description: The posterior dominant rhythm consists of 9 Hz activity of moderate voltage (25-35 uV) seen predominantly in posterior head regions, symmetric and reactive to eye opening and eye closing. Sleep was characterized by vertex waves, sleep spindles (12 to 14 Hz), maximal frontocentral region. No eeg change was seen during hyperventilation. Photic driving was not seen during photic stimulation ?  ?IMPRESSION: ?This study is within normal limits. No seizures or epileptiform discharges were seen throughout the recording. ?  ?Lora Havens  ?

## 2021-04-27 NOTE — Progress Notes (Signed)
EMU maint complete - no skin breakdown [facility] monitored  ?

## 2021-04-27 NOTE — Consult Note (Signed)
?Cardiology Consultation:  ? ?Patient ID: [name] ?MRN: [id number]; DOB: 1955/03/06 ? ?Admit date: 04/25/2021 ?Date of Consult: 04/27/2021 ? ?PCP:  Biagio Borg, MD ?  ?[facility] Providers ?Cardiologist:  [facility] ? ?Patient Profile:  ? ?[name] is a 66 y.o. female with a history of  normal coronary arteries on coronary CTA in 09/2020 at [facility], HOCM s/p surgical myectomy in 04/2015 with redo surgery in 04/2016, mechanical mitral valve replacement in 04/2016, complete heart block s/p PPM, paroxysmal atrial fibrillation on Coumadin, SVT s/p ablation in the 1990s, chronic LBBB, subdural hematoma in 07/2018, hypertension, hyperlipidemia, hypothyroidism, prior breast cancer, prior ovarian cancer, seizure disorder, and anxiety who is being seen 04/27/2021 for the evaluation of chest pain at the request of Dr. Hortense Ramal. ? ?History of Present Illness:  ? ?[name] is a 66 year old female with the above history who is followed by [facility].  Patient has a history of hypertrophic obstructive cardiomyopathy s/p surgical myectomy in 02/2015 and then a redo septal myectomy with mechanical mitral valve replacement and pulmonary vein isolation in 04/2016.  She then underwent mechanical mitral valve replacement in 2018.  She also has a history of complete heart block following second cardiac surgery s/p [employer] PPM, SVT with remote ablation in the 1990s, and paroxysmal atrial fibrillation. She was last seen by her Cardiologist in 08/2020 at which time she reported back pain that was worse with physical activity reminded her of her prior anginal equivalent before her myectomy.  She also reported increased dyspnea on exertion.  Cardiac cath prior to myectomy showed normal coronaries.  Coronary CTA and Echo were ordered for further evaluation.  Echo showed normal LV function with moderate LVH and hypokinetic septal wall with no evidence of significant LVOT, normal RV function, trivial AI, trivial MR, trivial PR, mild TR.  CTA  showed coronary calcium score of 0 with no evidence of CAD. ? ?Since her second cardiac surgery in 2018, patient has had recurrent seizure-like activity. These episodes will start with tunnel/"kaleidoscope" vision and then her eyes reportedly turn in opposite directions (one up and one down).  Her family knows to lowered her to the ground at this point and she will start shaking and hyperventilating.  She also reports that when she is having a seizure, she cannot walk a straight line and that she walks in the shape of a "C." These episodes typically last for about 15 to 25 minutes.  She has been having multiple episodes a day the past couple weeks.  Follows with neurology for this was direct admitted on 04/25/2021 to the epilepsy monitoring unit for characterization of seizure like episodes.  So far EEGs have shown no seizures.  Cardiology was consulted today because she started to complain of chest pain.  EKG showed normal sinus rhythm with known LBBB. High-sensitivity troponin negative at 12. ? ?As high as evaluation, patient resting comfortably in no acute distress.  She denies any chest pain at this time.  Patient reports intermittent minute pinpoint left-sided chest pain that she states is very deep.  This has been going on for several months now and she thinks it is similar to the pain that she had back in 08/2020 when her cardiologist ordered a coronary CTA.  This pain occurs randomly.  It has occurred both at rest and with activity but she usually notices it when she is laying down on her back at night.  Improved some when she rolls over to her left side.  She does not have any reproducible pain on exam.  She also has chronic dyspnea on exertion which she thinks is getting worse.  However, she denies any shortness of breath at rest.  No orthopnea, PND, lower extremity edema.  No palpitations.  She notes some lightheadedness and near syncope prior to the seizure-like episodes but no outside of these  episodes. ? ?Chronic rep came to interrogate device while I was in the room.  Device is functioning normally.  Does have rare PVCs and occasional short runs of NSVT.  Last run of NSVT was this 6-second episode that occurred on 03/20/2021 pounds 11:30 AM.  Interestingly, patient does state that she had a seizure episode around this time. ? ?Past Medical History:  ?Diagnosis Date  ? Abdominal pain, epigastric 10/25/2007  ? ADD 01/20/2009  ? AKI (acute kidney injury) ([place]) 04/11/2016  ? ALLERGIC RHINITIS 10/15/2006  ? ANGIOEDEMA 08/12/2008  ? ANXIETY 10/15/2006  ? ASTHMATIC BRONCHITIS, ACUTE 01/27/2008  ? Bowel obstruction ([place])   ? Breast cancer ([place])   ? Cellulitis and abscess of leg, except foot 08/12/2007  ? CELLULITIS, [place] 12/21/2008  ? CHB (complete heart block) ([place]) 04/14/2016  ? CHEST PAIN 06/14/2007  ? Chronic anticoagulation 05/16/2016  ? Chronic sinus infection 04/12/2010  ? COLECTOMY, PARTIAL, WITH ANASTOMOSIS, HX OF 10/15/2006  ? COLONIC POLYPS, HX OF 10/15/2006  ? CYST, OVARIAN NEC/NOS 10/15/2006  ? Dengue 02/22/2010  ? DVT, HX OF 10/15/2006  ? DYSAUTONOMIA 10/15/2006  ? Dysfunction of eustachian tube 05/26/2009  ? GI BLEEDING 04/04/2007  ? Glossitis 03/06/2008  ? HEADACHE, CHRONIC 04/04/2007  ? Heart murmur   ? Hematemesis 10/25/2007  ? HEMORRHOIDS 04/04/2007  ? HYPERLIPIDEMIA 12/12/2007  ? HYPERTENSION 10/15/2006  ? Hypertrophic cardiomyopathy ([place])   ? HYPOTHYROIDISM 03/06/2008  ? IBS 04/04/2007  ? MASTECTOMY, BILATERAL, HX OF 08/12/2009  ? NECK MASS 07/01/2007  ? NEOP, MALIGNANT, FEMALE BREAST NOS 10/15/2006  ? NEOP, MALIGNANT, 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  ? Ovarian cancer ([place])   ? Presence of cardiac pacemaker 04/25/2016  ? Red blood cell antibody positive   ? K  ? Seizures ([place])   ? Subdural hematoma ([facility])   ? SYNCOPE 07/01/2007  ? Wheezing 08/20/2008  ? WOUND, OPEN, LEG, WITHOUT COMPLICATION 5/80/9983  ? ? ?Past Surgical History:  ?Procedure Laterality Date  ? ABDOMINAL HYSTERECTOMY    ? APPENDECTOMY    ? BREAST RECONSTRUCTION    ?  CARDIAC ELECTROPHYSIOLOGY MAPPING AND ABLATION    ? CESAREAN SECTION    ? CHOLECYSTECTOMY    ? COLON RESECTION  2005  ? COLON SURGERY  06/2020  ? COLONOSCOPY W/ BIOPSIES    ? FLEXIBLE SIGMOIDOSCOPY    ? LEFT AND RIGHT HEART CATHETERIZATION WITH CORONARY ANGIOGRAM N/A 10/07/2012  ? Procedure: LEFT AND RIGHT HEART CATHETERIZATION WITH CORONARY ANGIOGRAM;  Surgeon: Jolaine Artist, MD;  Location: [facility] CATH LAB;  Service: Cardiovascular;  Laterality: N/A;  ? MASTECTOMY Bilateral   ? MINIMALLY INVASIVE TRICUSPID VALVE REPAIR    ? MITRAL VALVE REPAIR  2017  ? MITRAL VALVE REPLACEMENT    ? OOPHORECTOMY    ? SPIGELIAN HERNIA    ?  ? ?Home Medications:  ?Prior to Admission medications   ?Medication Sig Start Date End Date Taking? Authorizing Provider  ?acetaminophen-codeine (TYLENOL #3) 300-30 MG tablet TAKE 1 TABLET BY MOUTH FOUR TIMES DAILY AS NEEDED FOR PAIN ?Patient taking differently: Take 1 tablet by mouth 4 (four) times daily as needed for moderate pain. 04/18/21  Yes John,  Hunt Oris, MD  ?albuterol (VENTOLIN HFA) 108 (90 Base) MCG/ACT inhaler Inhale 2 puffs into the lungs every 6 (six) hours as needed for wheezing or shortness of breath. 11/18/20  Yes Biagio Borg, MD  ?levothyroxine (SYNTHROID) 50 MCG tablet TAKE 1 TABLET(50 MCG) BY MOUTH EVERY DAY ?Patient taking differently: Take 50 mcg by mouth daily before breakfast. 04/22/21  Yes Biagio Borg, MD  ?metoprolol succinate (TOPROL-XL) 50 MG 24 hr tablet Take 50 mg by mouth daily. 04/15/21  Yes [provider]  ?Multiple Vitamins-Minerals (ZINC PO) Take 1 capsule by mouth daily.   Yes [provider]  ?ondansetron (ZOFRAN-ODT) 8 MG disintegrating tablet Take 1 tablet (8 mg total) by mouth every 8 (eight) hours as needed for nausea. 09/20/19  Yes Leamon Arnt, MD  ?venlafaxine XR (EFFEXOR-XR) 150 MG 24 hr capsule Take 1 capsule (150 mg total) by mouth daily. 06/15/20  Yes Biagio Borg, MD  ?vitamin C (ASCORBIC ACID) 500 MG tablet Take 500 mg by  mouth daily.    Yes [provider]  ?VITAMIN D PO Take 1 tablet by mouth daily.   Yes [provider]  ?warfarin (COUMADIN) 5 MG tablet Take 5-10 mg by mouth daily. Take '5mg'$  by mouth on SAT MON WED

## 2021-04-27 NOTE — Progress Notes (Signed)
ANTICOAGULATION CONSULT NOTE - Follow-Up Consult ? ?Pharmacy Consult for warfarin ?Indication: mechanical MVR with afib ? ?Allergies  ?Allergen Reactions  ? Fish Allergy Anaphylaxis and Swelling  ?  Scaled fish, like flounder  ? Penicillins Anaphylaxis, Hives and Rash  ? Levofloxacin In D5w Nausea And Vomiting and Swelling  ?  Causes throat swelling  ? Nitroglycerin Other (See Comments)  ?  Loss of consciousness ?  ? Pneumovax [Pneumococcal Polysaccharide Vaccine] Other (See Comments)  ?  Rash, arm swelling  ? Sulfamethoxazole   ?  Other reaction(s): GI bleeding ?Other reaction(s): Blood Disorder  ? Latex Dermatitis and Rash  ?  If worn for more than a day, this causes blisters ?  ? ? ?Patient Measurements: ?Height: '5\' 9"'$  (175.3 cm) ?Weight:  (declined) ?IBW/kg (Calculated) : 66.2 ? ?Vital Signs: ?Temp: (P) 98.2 ?F (36.8 ?C) (04/26 1211) ?Temp Source: (P) Oral (04/26 1211) ?BP: 153/87 (04/26 0816) ?Pulse Rate: (P) 72 (04/26 1211) ? ?Labs: ?Recent Labs  ?  04/25/21 ?1102 04/27/21 ?0203 04/27/21 ?1259  ?HGB 13.4  --   --   ?HCT 40.3  --   --   ?PLT 238  --   --   ?LABPROT 21.6* 22.4*  --   ?INR 1.9* 2.0*  --   ?CREATININE 0.98  --   --   ?TROPONINIHS  --   --  12  ? ? ? ?Estimated Creatinine Clearance: 67.7 mL/min (by C-G formula based on SCr of 0.98 mg/dL). ? ? ?Medical History: ?Past Medical History:  ?Diagnosis Date  ? Abdominal pain, epigastric 10/25/2007  ? ADD 01/20/2009  ? AKI (acute kidney injury) ([place]) 04/11/2016  ? ALLERGIC RHINITIS 10/15/2006  ? ANGIOEDEMA 08/12/2008  ? ANXIETY 10/15/2006  ? ASTHMATIC BRONCHITIS, ACUTE 01/27/2008  ? Bowel obstruction ([place])   ? Breast cancer ([place])   ? Cellulitis and abscess of leg, except foot 08/12/2007  ? CELLULITIS, [place] 12/21/2008  ? CHB (complete heart block) ([place]) 04/14/2016  ? CHEST PAIN 06/14/2007  ? Chronic anticoagulation 05/16/2016  ? Chronic sinus infection 04/12/2010  ? COLECTOMY, PARTIAL, WITH ANASTOMOSIS, HX OF 10/15/2006  ? COLONIC POLYPS, HX OF 10/15/2006  ? CYST,  OVARIAN NEC/NOS 10/15/2006  ? Dengue 02/22/2010  ? DVT, HX OF 10/15/2006  ? DYSAUTONOMIA 10/15/2006  ? Dysfunction of eustachian tube 05/26/2009  ? GI BLEEDING 04/04/2007  ? Glossitis 03/06/2008  ? HEADACHE, CHRONIC 04/04/2007  ? Heart murmur   ? Hematemesis 10/25/2007  ? HEMORRHOIDS 04/04/2007  ? HYPERLIPIDEMIA 12/12/2007  ? HYPERTENSION 10/15/2006  ? Hypertrophic cardiomyopathy ([place])   ? HYPOTHYROIDISM 03/06/2008  ? IBS 04/04/2007  ? MASTECTOMY, BILATERAL, HX OF 08/12/2009  ? NECK MASS 07/01/2007  ? NEOP, MALIGNANT, FEMALE BREAST NOS 10/15/2006  ? NEOP, MALIGNANT, 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  ? Ovarian cancer ([place])   ? Presence of cardiac pacemaker 04/25/2016  ? Red blood cell antibody positive   ? K  ? Seizures ([place])   ? Subdural hematoma ([facility])   ? SYNCOPE 07/01/2007  ? Wheezing 08/20/2008  ? WOUND, OPEN, LEG, WITHOUT COMPLICATION 07/02/1599  ? ? ?Medications:  ?Medications Prior to Admission  ?Medication Sig Dispense Refill Last Dose  ? acetaminophen-codeine (TYLENOL #3) 300-30 MG tablet TAKE 1 TABLET BY MOUTH FOUR TIMES DAILY AS NEEDED FOR PAIN (Patient taking differently: Take 1 tablet by mouth 4 (four) times daily as needed for moderate pain.) 120 tablet 2 04/25/2021  ? albuterol (VENTOLIN HFA) 108 (90 Base) MCG/ACT inhaler Inhale 2 puffs into the lungs every 6 (six) hours  as needed for wheezing or shortness of breath. 8 g 5 Past Month  ? levothyroxine (SYNTHROID) 50 MCG tablet TAKE 1 TABLET(50 MCG) BY MOUTH EVERY DAY (Patient taking differently: Take 50 mcg by mouth daily before breakfast.) 90 tablet 0 04/25/2021  ? metoprolol succinate (TOPROL-XL) 50 MG 24 hr tablet Take 50 mg by mouth daily.   04/25/2021 at 0700  ? Multiple Vitamins-Minerals (ZINC PO) Take 1 capsule by mouth daily.   04/24/2021  ? ondansetron (ZOFRAN-ODT) 8 MG disintegrating tablet Take 1 tablet (8 mg total) by mouth every 8 (eight) hours as needed for nausea. 30 tablet 0 Past Month  ? venlafaxine XR (EFFEXOR-XR) 150 MG 24 hr capsule Take 1 capsule (150 mg total) by mouth  daily. 90 capsule 3 04/25/2021  ? vitamin C (ASCORBIC ACID) 500 MG tablet Take 500 mg by mouth daily.    04/24/2021  ? VITAMIN D PO Take 1 tablet by mouth daily.   04/24/2021  ? warfarin (COUMADIN) 5 MG tablet Take 5-10 mg by mouth daily. Take '5mg'$  by mouth on SAT MON WED FRI in the evening, then take '10mg'$  on SUN TUE THU.   04/24/2021 at 2200  ? zonisamide (ZONEGRAN) 100 MG capsule Take 5 capsules every night (Patient taking differently: Take 500 mg by mouth at bedtime.) 150 capsule 6 04/24/2021  ? ondansetron (ZOFRAN) 4 MG tablet Take 1 tablet (4 mg total) by mouth every 8 (eight) hours as needed for nausea or vomiting. (Patient not taking: Reported on 04/25/2021) 30 tablet 0 Not Taking  ? warfarin (COUMADIN) 10 MG tablet Take 1 tablet daily or as directed by anticoagulation clinic (Patient not taking: Reported on 04/25/2021) 90 tablet 0 Not Taking  ? zolpidem (AMBIEN) 10 MG tablet TAKE 1 TABLET BY MOUTH EVERY DAY AT BEDTIME AS NEEDED (Patient not taking: Reported on 04/25/2021) 90 tablet 1 Not Taking  ? ? ?Assessment: ?Patient is a 66 yo F admitted to the epilepsy monitoring unit for characterization of seizure spells.  She is on warfarin PTA for hx mechanical MVR, afib, and hx DVT.  Pharmacy has been consulted to manage warfarin dosing.  INR 4/24 was 1.9. ?  ?PTA regimen warfarin '10mg'$  TuThSun and '5mg'$  all other days.   ? ?INR remains subtherapeutic at 2.  Will increase dose for this evening. ? ?Goal of Therapy:  ?INR 2.5-3.5 ?Monitor platelets by anticoagulation protocol: Yes ?  ?Plan:  ?Warfarin '10mg'$  x 1 tonight ?INR in AM. ? ?[employer], Pharm.D., BCPS ?Clinical Pharmacist ? ?**Pharmacist phone directory can be found on [email or web address] listed under [place]. ? ?04/27/2021 3:43 PM ? ? ? ?

## 2021-04-27 NOTE — Progress Notes (Addendum)
EEG checked, re glued and prep F3, C4 and A2. No skin breakdown noted however there was slight redness under F3 ?

## 2021-04-27 NOTE — Progress Notes (Signed)
Subjective: Patient reports mild pain in the left side of her chest.  States has been going on since yesterday. ? ?ROS: negative except above ? ?Examination ? ?Vital signs in last 24 hours: ?Temp:  [98.2 ?F (36.8 ?C)-98.9 ?F (37.2 ?C)] (P) 98.2 ?F (36.8 ?C) (04/26 1211) ?Pulse Rate:  [65-74] (P) 72 (04/26 1211) ?Resp:  [16-20] (P) 17 (04/26 1211) ?BP: (153-160)/(87-99) 153/87 (04/26 0816) ?SpO2:  [95 %-98 %] 96 % (04/26 1211) ? ?General: lying in bed, NAD ?Neuro:AOX3, CN 2-12 grossly intact, spontaneously moving all extremities ? ?Basic Metabolic Panel: ?Recent Labs  ?Lab 04/25/21 ?1102  ?NA 139  ?K 4.0  ?CL 108  ?CO2 24  ?GLUCOSE 97  ?BUN 14  ?CREATININE 0.98  ?CALCIUM 9.0  ?MG 2.0  ?PHOS 3.3  ? ? ?CBC: ?Recent Labs  ?Lab 04/25/21 ?1102  ?WBC 5.7  ?NEUTROABS 3.8  ?HGB 13.4  ?HCT 40.3  ?MCV 84.7  ?PLT 238  ? ? ? ?Coagulation Studies: ?Recent Labs  ?  04/25/21 ?1102 04/27/21 ?0203  ?LABPROT 21.6* 22.4*  ?INR 1.9* 2.0*  ? ? ?Imaging ?No new brain imaging overnight ?  ?ASSESSMENT AND PLAN: 66 year old female admitted to epilepsy monitoring unit for characterization of seizure-like episodes. ?  ?Transient neurological deficits ?-Continue video EEG monitoring for characterization of spells ?- Hold zonisamide  ?-As needed IV Ativan for clinical seizure-like activity ?-Seizure precautions ? ?Chest pain ?-Cardiology consult, EKG and troponin ?  ?Mechanical MVR with afib ?- Continue Coumadin as prescribed ?- Pharmacy consult ?  ?Post menopausal hot flashes ?-Continue Effexor as prescribed ?  ?Hypothyroidism ?-Continue levothyroxine ? ?Hypertension ?-Continue metoprolol ? ?Chronic pain ?- continue Tylenol 3 ?  ?  ?I have spent a total of  38 minutes with the patient reviewing hospital notes,  test results, labs and examining the patient as well as establishing an assessment and plan that was discussed personally with the patient.  > 50% of time was spent in direct patient care. ? ?Zeb Comfort ?Epilepsy ?[facility] ?For questions after 5pm please refer to AMION to reach the Neurologist on call ? ?

## 2021-04-28 ENCOUNTER — Inpatient Hospital Stay (HOSPITAL_COMMUNITY): Payer: BC Managed Care – PPO

## 2021-04-28 DIAGNOSIS — Z954 Presence of other heart-valve replacement: Secondary | ICD-10-CM | POA: Diagnosis not present

## 2021-04-28 DIAGNOSIS — I340 Nonrheumatic mitral (valve) insufficiency: Secondary | ICD-10-CM

## 2021-04-28 LAB — PROTIME-INR
INR: 2.3 — ABNORMAL HIGH (ref 0.8–1.2)
Prothrombin Time: 24.7 seconds — ABNORMAL HIGH (ref 11.4–15.2)

## 2021-04-28 LAB — ECHOCARDIOGRAM COMPLETE
AR max vel: 2.96 cm2
AV Area VTI: 3.07 cm2
AV Area mean vel: 2.92 cm2
AV Mean grad: 8 mmHg
AV Peak grad: 13.5 mmHg
Ao pk vel: 1.84 m/s
Area-P 1/2: 2.68 cm2
Height: 69 in
P 1/2 time: 570 msec
S' Lateral: 2.5 cm
Weight: 3199.32 oz

## 2021-04-28 MED ORDER — ZONISAMIDE 100 MG PO CAPS
500.0000 mg | ORAL_CAPSULE | Freq: Every day | ORAL | Status: DC
  Administered 2021-04-28: 500 mg via ORAL
  Filled 2021-04-28 (×2): qty 5

## 2021-04-28 MED ORDER — WARFARIN SODIUM 5 MG PO TABS
10.0000 mg | ORAL_TABLET | Freq: Once | ORAL | Status: AC
  Administered 2021-04-28: 10 mg via ORAL
  Filled 2021-04-28: qty 2

## 2021-04-28 MED ORDER — PERFLUTREN LIPID MICROSPHERE
1.0000 mL | INTRAVENOUS | Status: AC | PRN
  Administered 2021-04-28: 4 mL via INTRAVENOUS
  Filled 2021-04-28: qty 10

## 2021-04-28 NOTE — Progress Notes (Signed)
EEG maint complete.  ?

## 2021-04-28 NOTE — Procedures (Signed)
Patient Name: [name]  ?MRN: [id number]  ?Epilepsy Attending: Lora Havens  ?Referring Physician/Provider: Lora Havens, MD ?Duration: 04/27/2021 6294 to 04/28/2021 [id number] ?  ?Patient history: 66 year old female admitted to epilepsy monitoring unit for characterization of seizure-like episodes.  EEG for characterization of spells ?  ?Level of alertness: Awake, asleep ?  ?AEDs during EEG study: None ?  ?Technical aspects: This EEG study was done with scalp electrodes positioned according to the 10-20 International system of electrode placement. Electrical activity was acquired at a sampling rate of '500Hz'$  and reviewed with a high frequency filter of '70Hz'$  and a low frequency filter of '1Hz'$ . EEG data were recorded continuously and digitally stored.  ?  ?Description: The posterior dominant rhythm consists of 9 Hz activity of moderate voltage (25-35 uV) seen predominantly in posterior head regions, symmetric and reactive to eye opening and eye closing. Sleep was characterized by vertex waves, sleep spindles (12 to 14 Hz), maximal frontocentral region.  ?  ?IMPRESSION: ?This study is within normal limits. No seizures or epileptiform discharges were seen throughout the recording. ?  ?Lora Havens  ?

## 2021-04-28 NOTE — Progress Notes (Signed)
EMU maint complete - no skin breakdown under: FP1 Fp2 T4 ?Serviced T4  ?[facility] monitored ? ?

## 2021-04-28 NOTE — Progress Notes (Signed)
vLTM maintenance.  All imp below 10k   Patient requested A1 removal due to irritation.  Area pink no skin breakdown noted. ?

## 2021-04-28 NOTE — Progress Notes (Signed)
? ?Progress Note ? ?Patient Name: [name] ?Date of Encounter: 04/28/2021 ? ?[facility] Cardiologist: [facility]  ? ?Subjective  ? ?No acute overnight events. She is doing well this morning with no complaints. No recurrent chest pain. Sounds like Neurology is going to try to provoke a seizure today. ? ?Inpatient Medications  ?  ?Scheduled Meds: ? levothyroxine  50 mcg Oral Q0600  ? metoprolol succinate  50 mg Oral Daily  ? sodium chloride flush  3 mL Intravenous Q12H  ? venlafaxine XR  150 mg Oral Q breakfast  ? warfarin  10 mg Oral ONCE-1600  ? Warfarin - Pharmacist Dosing Inpatient   Does not apply [id number]  ? ?Continuous Infusions: ? ?PRN Meds: ?acetaminophen **OR** acetaminophen, acetaminophen-codeine, labetalol, LORazepam  ? ?Vital Signs  ?  ?Vitals:  ? 04/27/21 2015 04/27/21 2318 04/28/21 0315 04/28/21 0740  ?BP: (!) 152/90 (!) 149/79 137/87 (!) 148/84  ?Pulse: 68 68 72 70  ?Resp: '16 20 14 16  '$ ?Temp: 98.1 ?F (36.7 ?C) 98.6 ?F (37 ?C) 97.8 ?F (36.6 ?C) 98.2 ?F (36.8 ?C)  ?TempSrc: Oral Oral Oral Oral  ?SpO2: 94% 95% 93% 97%  ?Weight:      ?Height:      ? ?No intake or output data in the 24 hours ending 04/28/21 1035 ? ?  04/25/2021  ?  9:00 AM 02/03/2021  ? 12:54 PM  ?Last 3 Weights  ?Weight (lbs) 199 lb 15.3 oz 200 lb  ?Weight (kg) 90.7 kg 90.719 kg  ?   ? ?Telemetry  ?  ?Normal sinus rhythm with rates in the 60s to 70s. One 6 beat run of NSVT. - Personally Reviewed ? ?ECG  ?  ?No new ECG tracing today. - Personally Reviewed ? ?Physical Exam  ? ?GEN: No acute distress.   ?Neck: No JVD. ?Cardiac: RRR. II-III/VI systolic murmur. No gallops or rubs. ?Respiratory: Clear to auscultation bilaterally. ?GI: Soft, non-distended, and non-tender. ?MS: No lower extremity edema. No deformity. ?Skin: Warm and dry. ?Neuro:  No focal deficits. ?Psych: Normal affect. Responds appropriately. ? ?Labs  ?  ?High Sensitivity Troponin:   ?Recent Labs  ?Lab 04/27/21 ?1259  ?TROPONINIHS 12  ?   ?Chemistry ?Recent Labs  ?Lab  04/25/21 ?1102  ?NA 139  ?K 4.0  ?CL 108  ?CO2 24  ?GLUCOSE 97  ?BUN 14  ?CREATININE 0.98  ?CALCIUM 9.0  ?MG 2.0  ?PROT 6.7  ?ALBUMIN 4.1  ?AST 27  ?ALT 22  ?ALKPHOS 118  ?BILITOT 0.6  ?GFRNONAA >60  ?ANIONGAP 7  ?  ?Lipids No results for input(s): CHOL, TRIG, HDL, LABVLDL, LDLCALC, CHOLHDL in the last 168 hours.  ?Hematology ?Recent Labs  ?Lab 04/25/21 ?1102  ?WBC 5.7  ?RBC 4.76  ?HGB 13.4  ?HCT 40.3  ?MCV 84.7  ?MCH 28.2  ?MCHC 33.3  ?RDW 14.1  ?PLT 238  ? ?Thyroid No results for input(s): TSH, FREET4 in the last 168 hours.  ?BNPNo results for input(s): BNP, PROBNP in the last 168 hours.  ?DDimer No results for input(s): DDIMER in the last 168 hours.  ? ?Radiology  ?  ?No results found. ? ?Cardiac Studies  ? ?Echocardiogram 08/26/2020 ([facility]): ?Interpretation: ?NORMAL LEFT VENTRICULAR FUNCTION WITH MODERATE LVH  ?NORMAL LA PRESSURES WITH NORMAL DIASTOLIC FUNCTION  ?NORMAL RIGHT VENTRICULAR SYSTOLIC FUNCTION  ?VALVULAR REGURGITATION: TRIVIAL AR, TRIVIAL MR, TRIVIAL PR, MILD TR  ?PROSTHETIC VALVE(S): [facility] PROSTHETIC MV  ?_______________ ?  ?Coronary CTA 09/15/2020 ([facility]): ?Impression: ?1.  No atherosclerotic plaque or luminal  narrowing. Short segment of proximal RCA is not well evaluated due to streak artifact from adjacent pacemaker lead. CAD-RADS: 0  ?2.  Thickening of the basal anterior septum measuring up to 18 mm.  ?3.  Calcium score: 0  ?4.  CTFFR Analysis: CT FFR will not be performed for this study  ? ?Patient Profile  ?   ?66 y.o. female with a history of  normal coronary arteries on coronary CTA in 09/2020 at [facility], HOCM s/p surgical myectomy in 04/2015 with redo surgery in 04/2016, mechanical mitral valve replacement in 04/2016, complete heart block s/p PPM, paroxysmal atrial fibrillation on Coumadin, SVT s/p ablation in the 1990s, chronic LBBB, subdural hematoma in 07/2018, hypertension, hyperlipidemia, hypothyroidism, prior breast cancer, prior ovarian cancer, seizure disorder, and anxiety who is being seen  04/27/2021 for the evaluation of chest pain at the request of Dr. Hortense Ramal. ? ?Assessment & Plan  ?  ?Chest Pain ?Coronary CTA in 09/2020 showed coronary calcium score of 0 with no CAD. She reports intermittent pinpoint left sided chest pain that occurs randomly but mostly at night when laying flat. Can last for several hours at a time and has been occurring for the past several months. EKG shows chronic LBBB. High-sensitivity troponin negative. This does not sounds like cardiac chest pain to me. He denies any recurrent chest pain today. Repeat Echo pending but do not anticipate any additional inpatient work-up. Recommend she follow-up with her primary Cardiologist at [facility]. ?  ?HOCM ?Long history of HOCM. S/p surgical myectomy in 04/2015 and then redo septal myectomy with mechanical mitral valve replacement and pulmonary vein isolation in 04/2016.  Last Echo in 08/2020 showed normal LV function with moderate LVH and hypokinetic septal wall with no evidence of significant LVOT. Patient states her septum has been getting thicker and she states she was told she may need another surgery but she is trying to postpone this as long as possible. Repeat Echo pending as above. ?  ?Mechanical Mitral Valve in 2018 ?Will reassess on Echo. Continue Coumadin. Dosing per Pharmacy. ?  ?Paroxysmal Atrial Fibrillation ?Complete Heart Block s/p PPM ?Paroxysmal SVT s/p Ablation in 1990s ?Non-Sustained VT ?Maintaining sinus rhythm. Rates in the 48s to 37s. [employer] device rep interrogated device. Device is functioning normally and showed very rare PVCs as well as occasional short runs of NSVT but no concerning arrhythmias. Also noted to have short 6 beat run of NSVT on telemetry. Continue Toprol-XL '50mg'$  daily. Continue Coumadin per Pharmacy. If unable to provoke seizure here, may want to consider outpatient monitor to see if she is having any sustained ventricular arrhythmias associated with her seizure like episodes. This can be performed by  primary Cardiologist at [facility]. ?  ?Hypertension ?BP mildly elevated. ?- Continue Toprol-XL '50mg'$  daily. ?- Could consider adding Verapamil if additional BP control is needed given HOCM. ?  ?Otherwise, per primary team: ?- Seizure-like activity ?- Hyperlipidemia ?- Hypothyroidism ? ?For questions or updates, please contact [place] ?Please consult [email or web address] for contact info under  ? ?  ?   ?Signed, ?Darreld Mclean, PA-C  ?04/28/2021, 10:35 AM   ? ?

## 2021-04-28 NOTE — Progress Notes (Signed)
ANTICOAGULATION CONSULT NOTE - Follow-Up Consult ? ?Pharmacy Consult for warfarin ?Indication: mechanical MVR with afib ? ?Allergies  ?Allergen Reactions  ? Fish Allergy Anaphylaxis and Swelling  ?  Scaled fish, like flounder  ? Penicillins Anaphylaxis, Hives and Rash  ? Levofloxacin In D5w Nausea And Vomiting and Swelling  ?  Causes throat swelling  ? Nitroglycerin Other (See Comments)  ?  Loss of consciousness ?  ? Pneumovax [Pneumococcal Polysaccharide Vaccine] Other (See Comments)  ?  Rash, arm swelling  ? Sulfamethoxazole   ?  Other reaction(s): GI bleeding ?Other reaction(s): Blood Disorder  ? Latex Dermatitis and Rash  ?  If worn for more than a day, this causes blisters ?  ? ? ?Patient Measurements: ?Height: '5\' 9"'$  (175.3 cm) ?Weight:  (declined) ?IBW/kg (Calculated) : 66.2 ? ?Vital Signs: ?Temp: 98.2 ?F (36.8 ?C) (04/27 0740) ?Temp Source: Oral (04/27 0740) ?BP: 148/84 (04/27 0740) ?Pulse Rate: 70 (04/27 0740) ? ?Labs: ?Recent Labs  ?  04/25/21 ?1102 04/27/21 ?0203 04/27/21 ?1259 04/28/21 ?0258  ?HGB 13.4  --   --   --   ?HCT 40.3  --   --   --   ?PLT 238  --   --   --   ?LABPROT 21.6* 22.4*  --  24.7*  ?INR 1.9* 2.0*  --  2.3*  ?CREATININE 0.98  --   --   --   ?TROPONINIHS  --   --  12  --   ? ? ? ?Estimated Creatinine Clearance: 67.7 mL/min (by C-G formula based on SCr of 0.98 mg/dL). ? ? ?Assessment: ?Patient is a 66 yo F admitted to the epilepsy monitoring unit for characterization of seizure spells.  She is on warfarin PTA for hx mechanical MVR, afib, and hx DVT.  Pharmacy has been consulted to manage warfarin dosing.  INR 4/24 was 1.9. ?  ?PTA regimen warfarin '10mg'$  TuThSun and '5mg'$  all other days.   ? ?INR remains subtherapeutic at 2.3 but is trending up. ? ?Goal of Therapy:  ?INR 2.5-3.5 ?Monitor platelets by anticoagulation protocol: Yes ?  ?Plan:  ?Warfarin '10mg'$  PO tonight ?INR in AM ?Monitor for s/sx of bleeding ? ?Thank you for involving pharmacy in this patient's care. ? ?Renold Genta, PharmD,  BCPS ?Clinical Pharmacist ?Clinical phone for 04/28/2021 until 3p is [phone] ?04/28/2021 9:09 AM ? ?**Pharmacist phone directory can be found on [email or web address] listed under [place]** ? ? ? ?

## 2021-04-28 NOTE — Progress Notes (Signed)
Subjective: No acute events overnight.  States cardiology saw her yesterday and interrogated her pacemaker.  They reported she had an arrhythmia right when one of the events happened most recently. ? ?ROS: negative except above ? ?Examination ? ?Vital signs in last 24 hours: ?Temp:  [97.8 ?F (36.6 ?C)-98.6 ?F (37 ?C)] 98.2 ?F (36.8 ?C) (04/27 0740) ?Pulse Rate:  [68-84] 84 (04/27 1038) ?Resp:  [14-20] 16 (04/27 0740) ?BP: (116-152)/(78-90) 148/84 (04/27 0740) ?SpO2:  [93 %-97 %] 97 % (04/27 0740) ? ?General: lying in bed, NAD ?Neuro:AOX3, CN 2-12 grossly intact, spontaneously moving all extremities ? ?Basic Metabolic Panel: ?Recent Labs  ?Lab 04/25/21 ?1102  ?NA 139  ?K 4.0  ?CL 108  ?CO2 24  ?GLUCOSE 97  ?BUN 14  ?CREATININE 0.98  ?CALCIUM 9.0  ?MG 2.0  ?PHOS 3.3  ? ? ?CBC: ?Recent Labs  ?Lab 04/25/21 ?1102  ?WBC 5.7  ?NEUTROABS 3.8  ?HGB 13.4  ?HCT 40.3  ?MCV 84.7  ?PLT 238  ? ? ? ?Coagulation Studies: ?Recent Labs  ?  04/25/21 ?1102 04/27/21 ?0203 04/28/21 ?0258  ?LABPROT 21.6* 22.4* 24.7*  ?INR 1.9* 2.0* 2.3*  ? ? ?Imaging ?No new brain imaging overnight ?  ?ASSESSMENT AND PLAN: 66 year old female admitted to epilepsy monitoring unit for characterization of seizure-like episodes. ?  ?Transient neurological deficits ?-Continue video EEG monitoring for characterization of spells ?-We will try to trigger an episode by making patient look down and exercise (patient's husband suggested this kind of motion has made patient had an event in the past) ?-Recommended maintaining diary of these events so in future we could correlate if the events happen with arrhythmia ?-We will resume zonisamide today ?- As needed IV Ativan for clinical seizure-like activity ?-Seizure precautions ?  ?Chest pain ?- Appreciate cardiology recommendations ?- TTE pending ?  ?Mechanical MVR with afib ?- Continue Coumadin as prescribed ?-Appreciate pharmacy recommendations ?  ?Post menopausal hot flashes ?-Continue Effexor as prescribed ?   ?Hypothyroidism ?-Continue levothyroxine ? ?Hypertension ?-Continue metoprolol ?  ?Chronic pain ?- continue Tylenol 3 ?  ? ?I have spent a total of  36 minutes with the patient reviewing hospital notes,  test results, labs and examining the patient as well as establishing an assessment and plan that was discussed personally with the patient.  > 50% of time was spent in direct patient care. ? ? ?Zeb Comfort ?Epilepsy ?[facility] ?For questions after 5pm please refer to AMION to reach the Neurologist on call ? ?

## 2021-04-29 DIAGNOSIS — R404 Transient alteration of awareness: Principal | ICD-10-CM

## 2021-04-29 LAB — PROTIME-INR
INR: 2.5 — ABNORMAL HIGH (ref 0.8–1.2)
Prothrombin Time: 26.4 seconds — ABNORMAL HIGH (ref 11.4–15.2)

## 2021-04-29 NOTE — Procedures (Addendum)
Patient Name: [name]  ?MRN: [id number]  ?Epilepsy Attending: Lora Havens  ?Referring Physician/Provider: Lora Havens, MD ?Duration: 04/28/2021 8811 to 04/29/2021 0919 ?  ?Patient history: 66 year old female admitted to epilepsy monitoring unit for characterization of seizure-like episodes.  EEG for characterization of spells ?  ?Level of alertness: Awake, asleep ?  ?AEDs during EEG study: None ?  ?Technical aspects: This EEG study was done with scalp electrodes positioned according to the 10-20 International system of electrode placement. Electrical activity was acquired at a sampling rate of '500Hz'$  and reviewed with a high frequency filter of '70Hz'$  and a low frequency filter of '1Hz'$ . EEG data were recorded continuously and digitally stored.  ?  ?Description: The posterior dominant rhythm consists of 9 Hz activity of moderate voltage (25-35 uV) seen predominantly in posterior head regions, symmetric and reactive to eye opening and eye closing. Sleep was characterized by vertex waves, sleep spindles (12 to 14 Hz), maximal frontocentral region.  ?  ?IMPRESSION: ?This study is within normal limits. No seizures or epileptiform discharges were seen throughout the recording. ?  ?Lora Havens  ?

## 2021-04-29 NOTE — Progress Notes (Signed)
Patient ready for discharge to home; discharge instructions given and reviewed; no new Rx's; patient discharged out via wheelchair; accompanied home by her husband. ?

## 2021-04-29 NOTE — TOC Transition Note (Signed)
Transition of Care (TOC) - CM/SW Discharge Note ? ? ?Patient Details  ?Name: [name] ?MRN: [id number] ?Date of Birth: 1955-04-23 ? ?Transition of Care (TOC) CM/SW Contact:  ?Pollie Friar, RN ?Phone Number: ?04/29/2021, 10:03 AM ? ? ?Clinical Narrative:    ?Patient is discharging home with self care. No needs per TOC. ? ? ?Final next level of care: Home/Self Care ?Barriers to Discharge: No Barriers Identified ? ? ?Patient Goals and CMS Choice ?  ?  ?  ? ?Discharge Placement ?  ?           ?  ?  ?  ?  ? ?Discharge Plan and Services ?  ?  ?           ?  ?  ?  ?  ?  ?  ?  ?  ?  ?  ? ?Social Determinants of Health (SDOH) Interventions ?  ? ? ?Readmission Risk Interventions ?   ? View : No data to display.  ?  ?  ?  ? ? ? ? ? ?

## 2021-04-29 NOTE — Progress Notes (Signed)
LTM EEG discontinued - no skin breakdown at unhook.   

## 2021-04-29 NOTE — Discharge Summary (Signed)
Physician Discharge Summary  ?Patient ID: ?[name] ?MRN: [id number] ?DOB/AGE: 1955-04-12 66 y.o. ? ?Admit date: 04/25/2021 ?Discharge date: 04/29/2021 ? ?Admission Diagnoses: Seizure ? ?Discharge Diagnoses: Transient alteration of awareness ?Active Problems: ?  HCM  ?  Non-cardiac chest pain ?  H/O mitral valve replacement with mechanical valve ? ? ?Discharged Condition: stable ? ?Hospital Course: Ms. Dicenso was admitted to epilepsy monitoring unit from 04/25/2021 to 04/29/2021.  During this time, she underwent continuous video EEG monitoring.  Her home EEG (zonisamide) was held, photic stimulation, hyperventilation as well as sleep deprivation were performed for seizure provocation.  Patient also reported that sometimes her events are triggered when she is looking down and exercising.  This was attempted during EMU stay but did not provoke an event. Her EEG was within normal limits.  No typical events were recorded. ? ?During the hospitalization, patient reported intermittent chest pain and therefore cardiology was consulted.  Patient's pacemaker was interrogated and patient was noted to have nonsustained brief runs of VT.  The timing of most recent run of VT coincided with patient's most recent event.  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  Cardiology recommended follow-up with patient's primary cardiologist at [facility]. ? ?Discussed with patient about maintaining a diary of events so when pacemaker is interrogated next time, cardiology can assess if episodes correlate with arrhythmia.  Also recommend continuing zonisamide in the interim as well as continuing seizure precautions.  Follow-up with primary neurologist ? ?Consults: cardiology ? ?Significant Diagnostic Studies: ?Description: The posterior dominant rhythm consists of 9 Hz activity of moderate voltage (25-35 uV) seen predominantly in posterior head regions, symmetric and reactive to eye opening and eye  closing. Sleep was characterized by vertex waves, sleep spindles (12 to 14 Hz), maximal frontocentral region. No eeg change was seen during hyperventilation. Photic driving was not seen during photic stimulation ?  ?IMPRESSION: ?This study is within normal limits. No seizures or epileptiform discharges were seen throughout the recording. ? ? ?Treatments: Continue home medications ? ?Discharge Exam: ?Blood pressure 131/79, pulse 73, temperature 98.6 ?F (37 ?C), temperature source Oral, resp. rate 17, height '5\' 9"'$  (1.753 m), weight 90.7 kg, SpO2 92 %. ? ?General: lying in bed, NAD ?Neuro:AOX3, CN 2-12 grossly intact, spontaneously moving all extremities ? ?Disposition: Home ? ? ?Allergies as of 04/29/2021   ? ?   Reactions  ? Fish Allergy Anaphylaxis, Swelling  ? Scaled fish, like flounder  ? Penicillins Anaphylaxis, Hives, Rash  ? Levofloxacin In D5w Nausea And Vomiting, Swelling  ? Causes throat swelling  ? Nitroglycerin Other (See Comments)  ? Loss of consciousness  ? Pneumovax [pneumococcal Polysaccharide Vaccine] Other (See Comments)  ? Rash, arm swelling  ? Sulfamethoxazole   ? Other reaction(s): GI bleeding ?Other reaction(s): Blood Disorder  ? Latex Dermatitis, Rash  ? If worn for more than a day, this causes blisters  ? ?  ? ?  ?Medication List  ?  ? ?TAKE these medications   ? ?acetaminophen-codeine 300-30 MG tablet ?Commonly known as: TYLENOL #3 ?TAKE 1 TABLET BY MOUTH FOUR TIMES DAILY AS NEEDED FOR PAIN ?What changed:  ?reasons to take this ?additional instructions ?  ?albuterol 108 (90 Base) MCG/ACT inhaler ?Commonly known as: VENTOLIN HFA ?Inhale 2 puffs into the lungs every 6 (six) hours as needed for wheezing or shortness of breath. ?  ?levothyroxine 50 MCG tablet ?Commonly known as: SYNTHROID ?TAKE 1 TABLET(50 MCG) BY MOUTH EVERY DAY ?What changed:  See the new instructions. ?  ?metoprolol succinate 50 MG 24 hr tablet ?Commonly known as: TOPROL-XL ?Take 50 mg by mouth daily. ?  ?ondansetron 4 MG  tablet ?Commonly known as: Zofran ?Take 1 tablet (4 mg total) by mouth every 8 (eight) hours as needed for nausea or vomiting. ?  ?ondansetron 8 MG disintegrating tablet ?Commonly known as: ZOFRAN-ODT ?Take 1 tablet (8 mg total) by mouth every 8 (eight) hours as needed for nausea. ?  ?venlafaxine XR 150 MG 24 hr capsule ?Commonly known as: EFFEXOR-XR ?Take 1 capsule (150 mg total) by mouth daily. ?  ?vitamin C 500 MG tablet ?Commonly known as: ASCORBIC ACID ?Take 500 mg by mouth daily. ?  ?VITAMIN D PO ?Take 1 tablet by mouth daily. ?  ?warfarin 5 MG tablet ?Commonly known as: COUMADIN ?Take 5-10 mg by mouth daily. Take '5mg'$  by mouth on SAT MON WED FRI in the evening, then take '10mg'$  on SUN TUE THU. ?  ?warfarin 10 MG tablet ?Commonly known as: Coumadin ?Take 1 tablet daily or as directed by anticoagulation clinic ?  ?ZINC PO ?Take 1 capsule by mouth daily. ?  ?zolpidem 10 MG tablet ?Commonly known as: AMBIEN ?TAKE 1 TABLET BY MOUTH EVERY DAY AT BEDTIME AS NEEDED ?  ?zonisamide 100 MG capsule ?Commonly known as: ZONEGRAN ?Take 5 capsules every night ?What changed:  ?how much to take ?how to take this ?when to take this ?additional instructions ?  ? ?  ? ?I have spent a total of  36  minutes with the patient reviewing hospital notes,  test results, labs and examining the patient as well as establishing an assessment and plan that was discussed personally with the patient.  > 50% of time was spent in direct patient care. ?  ? ?Signed: ?Lora Havens ?04/29/2021, 8:44 AM ? ? ?

## 2021-04-29 NOTE — Discharge Instructions (Addendum)
You were admitted to epilepsy monitoring unit from 04/25/2021 to 04/29/2021.  During this period you underwent continuous video EEG monitoring, zonisamide was held, hyperventilation, photic stimulation and sleep deprivation were performed.  No typical events were recorded.  EEG was within normal limits. ? ?You reported chest pain and therefore cardiology was consulted.  Pacemaker was interrogated and you were noted to have short runs of VT.  Differentials for these episodes includes Stokes-Adams syndrome versus nonepileptic events due to stress. ? ?Recommend keeping a diary of events, continuing zonisamide, seizure precautions and follow-up with primary neurologist.  ?

## 2021-05-19 ENCOUNTER — Telehealth: Payer: Self-pay | Admitting: Internal Medicine

## 2021-05-19 DIAGNOSIS — K580 Irritable bowel syndrome with diarrhea: Secondary | ICD-10-CM

## 2021-05-19 MED ORDER — ACETAMINOPHEN-CODEINE 300-30 MG PO TABS: 1.0000 | ORAL_TABLET | Freq: Four times a day (QID) | ORAL | 0 refills | Status: DC | PRN

## 2021-05-19 NOTE — Telephone Encounter (Signed)
1.Medication Requested: acetaminophen-codeine (TYLENOL #3) 300-30 MG tablet 2. Pharmacy (Name, Street, [facility]): [facility] [address], [place] ([place] Phone:  [phone]  Fax:  [phone]     3. On Med List: yes   4. Last Visit with PCP:  5. Next visit date with PCP:   Agent: Please be advised that RX refills may take up to 3 business days. We ask that you follow-up with your pharmacy.

## 2021-05-23 DIAGNOSIS — I421 Obstructive hypertrophic cardiomyopathy: Secondary | ICD-10-CM | POA: Diagnosis not present

## 2021-05-23 DIAGNOSIS — Z950 Presence of cardiac pacemaker: Secondary | ICD-10-CM | POA: Diagnosis not present

## 2021-05-23 DIAGNOSIS — Z7901 Long term (current) use of anticoagulants: Secondary | ICD-10-CM | POA: Diagnosis not present

## 2021-05-23 DIAGNOSIS — R4020 Unspecified coma: Secondary | ICD-10-CM | POA: Diagnosis not present

## 2021-06-06 ENCOUNTER — Encounter: Payer: BC Managed Care – PPO | Admitting: Internal Medicine

## 2021-06-13 ENCOUNTER — Encounter: Payer: BC Managed Care – PPO | Admitting: Internal Medicine

## 2021-06-20 ENCOUNTER — Telehealth: Payer: Self-pay | Admitting: Internal Medicine

## 2021-06-20 MED ORDER — ACETAMINOPHEN-CODEINE 300-30 MG PO TABS: 1.0000 | ORAL_TABLET | Freq: Four times a day (QID) | ORAL | 0 refills | Status: DC | PRN

## 2021-06-20 NOTE — Telephone Encounter (Signed)
Done erx 

## 2021-06-20 NOTE — Telephone Encounter (Signed)
Pt spouse called in requesting a refill on acetaminophen-codeine (TYLENOL/CODEINE #3) 300-30 MG tablet.   Pharmacy [place], [place] [address]  LOV 11/18/20 ROV 06/22/21

## 2021-06-22 ENCOUNTER — Encounter: Payer: Self-pay | Admitting: Internal Medicine

## 2021-06-22 ENCOUNTER — Ambulatory Visit (INDEPENDENT_AMBULATORY_CARE_PROVIDER_SITE_OTHER): Payer: BC Managed Care – PPO | Admitting: Internal Medicine

## 2021-06-22 VITALS — BP 120/78 | HR 61 | Temp 98.0°F | Ht 69.0 in

## 2021-06-22 DIAGNOSIS — M8580 Other specified disorders of bone density and structure, unspecified site: Secondary | ICD-10-CM | POA: Diagnosis not present

## 2021-06-22 DIAGNOSIS — I10 Essential (primary) hypertension: Secondary | ICD-10-CM | POA: Diagnosis not present

## 2021-06-22 DIAGNOSIS — Z0001 Encounter for general adult medical examination with abnormal findings: Secondary | ICD-10-CM | POA: Diagnosis not present

## 2021-06-22 DIAGNOSIS — E559 Vitamin D deficiency, unspecified: Secondary | ICD-10-CM | POA: Diagnosis not present

## 2021-06-22 DIAGNOSIS — F411 Generalized anxiety disorder: Secondary | ICD-10-CM

## 2021-06-22 DIAGNOSIS — H9203 Otalgia, bilateral: Secondary | ICD-10-CM

## 2021-06-22 MED ORDER — CEFUROXIME AXETIL 250 MG PO TABS: 250.0000 mg | ORAL_TABLET | Freq: Two times a day (BID) | ORAL | 1 refills | Status: AC

## 2021-06-22 NOTE — Progress Notes (Unsigned)
Patient ID: [name], female   DOB: 02-07-1955, 66 y.o.   MRN: [id number]         Chief Complaint:: wellness exam and Annual Exam  ***       HPI:  [name] is a 66 y.o. female here for wellness exam                        Also has VT associated pseudosiezures, to f/u with [facility] cardiology soon after seeing PA recently, trying to cut back on caffeine.  Not driving as her heart episodes lead to unconsious at times.  Has left cataract and retinal tearing - has upcoming optho sept 5 to consider tx, Dr Talbert Forest.  No longer driving.  Not flying due to VT.  Stopped doing ministry trips.  Have had some emesis in mouth at times, no aspiration so far.     Wt Readings from Last 3 Encounters:  04/25/21 199 lb 15.3 oz (90.7 kg)  02/03/21 200 lb (90.7 kg)  11/02/20 215 lb 3.2 oz (97.6 kg)   BP Readings from Last 3 Encounters:  06/22/21 120/78  04/29/21 131/79  11/18/20 122/70   Immunization History  Administered Date(s) Administered   H1N1 11/04/2007   HiB (PRP-OMP) 09/16/2007   Influenza Whole 11/20/2006, 11/04/2007   Influenza, Seasonal, Injecte, Preservative Fre 01/30/2012   Influenza-Unspecified 01/14/2015, 10/17/2019   Janssen (J&J) SARS-COV-2 Vaccination 07/15/2019, 07/22/2019   Pneumococcal Polysaccharide-23 02/11/2008   Td 02/11/2008, 04/21/2008   Health Maintenance Due  Topic Date Due   Zoster Vaccines- Shingrix (1 of 2) Never done   MAMMOGRAM  Never done   Pneumonia Vaccine 54+ Years old (2 - PCV) 02/10/2009   COLONOSCOPY (Pts 45-52yr Insurance coverage will need to be confirmed)  11/06/2015   TETANUS/TDAP  04/22/2018   DEXA SCAN  Never done      Past Medical History:  Diagnosis Date   Abdominal pain, epigastric 10/25/2007   ADD 01/20/2009   AKI (acute kidney injury) ([place] 04/11/2016   ALLERGIC RHINITIS 10/15/2006   ANGIOEDEMA 08/12/2008   ANXIETY 10/15/2006   ASTHMATIC BRONCHITIS, ACUTE 01/27/2008   Bowel obstruction (HCC)    Breast cancer (HCC)    Cellulitis and  abscess of leg, except foot 08/12/2007   CELLULITIS, FACE 12/21/2008   CHB (complete heart block) [place] 04/14/2016   CHEST PAIN 06/14/2007   Chronic anticoagulation 05/16/2016   Chronic sinus infection 04/12/2010   COLECTOMY, PARTIAL, WITH ANASTOMOSIS, HX OF 10/15/2006   COLONIC POLYPS, HX OF 10/15/2006   CYST, OVARIAN NEC/NOS 10/15/2006   Dengue 02/22/2010   DVT, HX OF 10/15/2006   DYSAUTONOMIA 10/15/2006   Dysfunction of eustachian tube 05/26/2009   GI BLEEDING 04/04/2007   Glossitis 03/06/2008   HEADACHE, CHRONIC 04/04/2007   Heart murmur    Hematemesis 10/25/2007   HEMORRHOIDS 04/04/2007   HYPERLIPIDEMIA 12/12/2007   HYPERTENSION 10/15/2006   Hypertrophic cardiomyopathy ([place]    HYPOTHYROIDISM 03/06/2008   IBS 04/04/2007   MASTECTOMY, BILATERAL, HX OF 08/12/2009   NECK MASS 07/01/2007   NEOP, MALIGNANT, FEMALE BREAST NOS 10/15/2006   NEOP, MALIGNANT, 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   Ovarian cancer ([facility]    Presence of cardiac pacemaker 04/25/2016   Red blood cell antibody positive    K   Seizures ([facility]    Subdural hematoma ([place]    SYNCOPE 07/01/2007   Wheezing 08/20/2008   WOUND, OPEN, LEG, WITHOUT COMPLICATION 80/86/7619  Past Surgical History:  Procedure Laterality Date  ABDOMINAL HYSTERECTOMY     APPENDECTOMY     BREAST RECONSTRUCTION     CARDIAC ELECTROPHYSIOLOGY MAPPING AND ABLATION     CESAREAN SECTION     CHOLECYSTECTOMY     COLON RESECTION  2005   COLON SURGERY  06/2020   COLONOSCOPY W/ BIOPSIES     FLEXIBLE SIGMOIDOSCOPY     LEFT AND RIGHT HEART CATHETERIZATION WITH CORONARY ANGIOGRAM N/A 10/07/2012   Procedure: LEFT AND RIGHT HEART CATHETERIZATION WITH CORONARY ANGIOGRAM;  Surgeon: Jolaine Artist, MD;  Location: [facility] CATH LAB;  Service: Cardiovascular;  Laterality: N/A;   MASTECTOMY Bilateral    MINIMALLY INVASIVE TRICUSPID VALVE REPAIR     MITRAL VALVE REPAIR  2017   MITRAL VALVE REPLACEMENT     OOPHORECTOMY     SPIGELIAN HERNIA      reports that she has never smoked. She has  never used smokeless tobacco. She reports that she does not currently use alcohol. She reports that she does not use drugs. family history includes Diabetes in her son; Heart disease in her mother; Melanoma in her father; Ovarian cancer in her sister; Pancreatic cancer in her father; Uterine cancer in her mother. Allergies  Allergen Reactions   Fish Allergy Anaphylaxis and Swelling    Scaled fish, like flounder   Penicillins Anaphylaxis, Hives and Rash   Levofloxacin In D5w Nausea And Vomiting and Swelling    Causes throat swelling   Nitroglycerin Other (See Comments)    Loss of consciousness    Pneumovax [Pneumococcal Polysaccharide Vaccine] Other (See Comments)    Rash, arm swelling   Sulfamethoxazole     Other reaction(s): GI bleeding Other reaction(s): Blood Disorder   Latex Dermatitis and Rash    If worn for more than a day, this causes blisters    Current Outpatient Medications on File Prior to Visit  Medication Sig Dispense Refill   acetaminophen-codeine (TYLENOL/CODEINE #3) 300-30 MG tablet Take 1 tablet by mouth every 6 (six) hours as needed. 120 tablet 0   albuterol (VENTOLIN HFA) 108 (90 Base) MCG/ACT inhaler Inhale 2 puffs into the lungs every 6 (six) hours as needed for wheezing or shortness of breath. 8 g 5   levothyroxine (SYNTHROID) 50 MCG tablet TAKE 1 TABLET(50 MCG) BY MOUTH EVERY DAY (Patient taking differently: Take 50 mcg by mouth daily before breakfast.) 90 tablet 0   metoprolol succinate (TOPROL-XL) 50 MG 24 hr tablet Take 50 mg by mouth daily.     Multiple Vitamins-Minerals (ZINC PO) Take 1 capsule by mouth daily.     ondansetron (ZOFRAN) 4 MG tablet Take 1 tablet (4 mg total) by mouth every 8 (eight) hours as needed for nausea or vomiting. 30 tablet 0   ondansetron (ZOFRAN-ODT) 8 MG disintegrating tablet Take 1 tablet (8 mg total) by mouth every 8 (eight) hours as needed for nausea. 30 tablet 0   venlafaxine XR (EFFEXOR-XR) 150 MG 24 hr capsule Take 1 capsule  (150 mg total) by mouth daily. 90 capsule 3   vitamin C (ASCORBIC ACID) 500 MG tablet Take 500 mg by mouth daily.      VITAMIN D PO Take 1 tablet by mouth daily.     warfarin (COUMADIN) 5 MG tablet Take 5-10 mg by mouth daily. Take '5mg'$  by mouth on SAT MON WED FRI in the evening, then take '10mg'$  on SUN TUE THU.     zonisamide (ZONEGRAN) 100 MG capsule Take 5 capsules every night (Patient taking differently: Take 500 mg by mouth at  bedtime.) 150 capsule 6   warfarin (COUMADIN) 10 MG tablet Take 1 tablet daily or as directed by anticoagulation clinic (Patient not taking: Reported on 04/25/2021) 90 tablet 0   zolpidem (AMBIEN) 10 MG tablet TAKE 1 TABLET BY MOUTH EVERY DAY AT BEDTIME AS NEEDED (Patient not taking: Reported on 04/25/2021) 90 tablet 1   No current facility-administered medications on file prior to visit.        ROS:  All others reviewed and negative.  Objective        PE:  BP 120/78 (BP Location: Left Arm, Patient Position: Sitting, Cuff Size: Large)   Pulse 61   Temp 98 F (36.7 C) (Oral)   Ht '5\' 9"'$  (1.753 m)   SpO2 97%   BMI 29.53 kg/m                 Constitutional: Pt appears in NAD               HENT: Head: NCAT.                Right Ear: External ear normal.                 Left Ear: External ear normal.                Eyes: . Pupils are equal, round, and reactive to light. Conjunctivae and EOM are normal               Nose: without d/c or deformity               Neck: Neck supple. Gross normal ROM               Cardiovascular: Normal rate and regular rhythm.                 Pulmonary/Chest: Effort normal and breath sounds without rales or wheezing.                Abd:  Soft, NT, ND, + BS, no organomegaly               Neurological: Pt is alert. At baseline orientation, motor grossly intact               Skin: Skin is warm. No rashes, no other new lesions, LE edema - ***               Psychiatric: Pt behavior is normal without agitation   Micro: none  Cardiac  tracings I have personally interpreted today:  none  Pertinent Radiological findings (summarize): none   Lab Results  Component Value Date   WBC 5.7 04/25/2021   HGB 13.4 04/25/2021   HCT 40.3 04/25/2021   PLT 238 04/25/2021   GLUCOSE 97 04/25/2021   CHOL 209 (H) 12/13/2011   TRIG 195.0 (H) 12/13/2011   HDL 45.10 12/13/2011   LDLDIRECT 137.4 12/13/2011   LDLCALC 73 02/11/2008   ALT 22 04/25/2021   AST 27 04/25/2021   NA 139 04/25/2021   K 4.0 04/25/2021   CL 108 04/25/2021   CREATININE 0.98 04/25/2021   BUN 14 04/25/2021   CO2 24 04/25/2021   TSH 3.04 10/02/2012   INR 2.5 (H) 04/29/2021   HGBA1C 5.1 04/25/2021   Assessment/Plan:  [name] is a 66 y.o. White or Caucasian [1] female with  has a past medical history of Abdominal pain, epigastric (10/25/2007), ADD (01/20/2009), AKI (acute kidney injury) ([place]) (04/11/2016), ALLERGIC RHINITIS (10/15/2006),  ANGIOEDEMA (08/12/2008), ANXIETY (10/15/2006), ASTHMATIC BRONCHITIS, ACUTE (01/27/2008), Bowel obstruction ([place]), Breast cancer ([place]), Cellulitis and abscess of leg, except foot (08/12/2007), CELLULITIS, FACE (12/21/2008), CHB (complete heart block) ([place]) (04/14/2016), CHEST PAIN (06/14/2007), Chronic anticoagulation (05/16/2016), Chronic sinus infection (04/12/2010), COLECTOMY, PARTIAL, WITH ANASTOMOSIS, HX OF (10/15/2006), COLONIC POLYPS, HX OF (10/15/2006), CYST, OVARIAN NEC/NOS (10/15/2006), Dengue (02/22/2010), DVT, HX OF (10/15/2006), DYSAUTONOMIA (10/15/2006), Dysfunction of eustachian tube (05/26/2009), GI BLEEDING (04/04/2007), Glossitis (03/06/2008), HEADACHE, CHRONIC (04/04/2007), Heart murmur, Hematemesis (10/25/2007), HEMORRHOIDS (04/04/2007), HYPERLIPIDEMIA (12/12/2007), HYPERTENSION (10/15/2006), Hypertrophic cardiomyopathy ([place]), HYPOTHYROIDISM (03/06/2008), IBS (04/04/2007), MASTECTOMY, BILATERAL, HX OF (08/12/2009), NECK MASS (07/01/2007), NEOP, MALIGNANT, FEMALE BREAST NOS (10/15/2006), NEOP, MALIGNANT, THYMUS (10/15/2006), Ovarian cancer  ([place]), Presence of cardiac pacemaker (04/25/2016), Red blood cell antibody positive, Seizures ([place]), Subdural hematoma ([place]), SYNCOPE (07/01/2007), Wheezing (08/20/2008), and WOUND, OPEN, LEG, WITHOUT COMPLICATION (0/16/0109).  No problem-specific Assessment & Plan notes found for this encounter.  Followup: No follow-ups on file.  Cathlean Cower, MD 06/22/2021 11:36 AM [facility]

## 2021-06-22 NOTE — Patient Instructions (Signed)
Please schedule the bone density test before leaving today at the scheduling desk (where you check out)  Please take all new medication as prescribed - the antibiotic  Please continue all other medications as before, and refills have been done if requested.  Please have the pharmacy call with any other refills you may need.  Please continue your efforts at being more active, low cholesterol diet, and weight control.  You are otherwise up to date with prevention measures today.  Please keep your appointments with your specialists as you may have planned  Please make an Appointment to return in 6 months, or sooner if needed

## 2021-06-26 ENCOUNTER — Encounter: Payer: Self-pay | Admitting: Internal Medicine

## 2021-06-26 DIAGNOSIS — M8580 Other specified disorders of bone density and structure, unspecified site: Secondary | ICD-10-CM | POA: Insufficient documentation

## 2021-06-26 NOTE — Assessment & Plan Note (Signed)
Mild to mod, for antibx course ceftin 250 bid,  to f/u any worsening symptoms or concerns

## 2021-06-26 NOTE — Assessment & Plan Note (Signed)
Overall chronic stable, declines need for change in tx or referral

## 2021-06-27 ENCOUNTER — Other Ambulatory Visit: Payer: Self-pay | Admitting: Internal Medicine

## 2021-06-27 DIAGNOSIS — M810 Age-related osteoporosis without current pathological fracture: Secondary | ICD-10-CM

## 2021-06-29 ENCOUNTER — Inpatient Hospital Stay: Admission: RE | Admit: 2021-06-29 | Payer: BC Managed Care – PPO | Source: Ambulatory Visit

## 2021-07-09 ENCOUNTER — Other Ambulatory Visit: Payer: Self-pay | Admitting: Internal Medicine

## 2021-07-20 ENCOUNTER — Telehealth: Payer: Self-pay | Admitting: Internal Medicine

## 2021-07-20 ENCOUNTER — Inpatient Hospital Stay: Admission: RE | Admit: 2021-07-20 | Payer: BC Managed Care – PPO | Source: Ambulatory Visit

## 2021-07-20 NOTE — Telephone Encounter (Signed)
1.Medication Requested: acetaminophen-codeine (TYLENOL/CODEINE #3) 300-30 MG tablet 2. Pharmacy (Name, Street, [facility]): [facility], [place] ([place] Phone:  [phone]  Fax:  [phone]     3. On Med List: yes  4. Last Visit with PCP: 6.26.2023  5. Next visit date with PCP: n/a   Agent: Please be advised that RX refills may take up to 3 business days. We ask that you follow-up with your pharmacy.

## 2021-07-22 MED ORDER — ACETAMINOPHEN-CODEINE 300-30 MG PO TABS: 1.0000 | ORAL_TABLET | Freq: Four times a day (QID) | ORAL | 0 refills | Status: DC | PRN

## 2021-08-03 ENCOUNTER — Inpatient Hospital Stay: Admission: RE | Admit: 2021-08-03 | Payer: BC Managed Care – PPO | Source: Ambulatory Visit

## 2021-08-18 ENCOUNTER — Other Ambulatory Visit: Payer: Self-pay

## 2021-08-18 ENCOUNTER — Telehealth: Payer: Self-pay

## 2021-08-18 MED ORDER — LEVOTHYROXINE SODIUM 50 MCG PO TABS: 50.0000 ug | ORAL_TABLET | Freq: Every day | ORAL | 0 refills | Status: DC

## 2021-08-18 NOTE — Telephone Encounter (Signed)
Levothyroxine sent.

## 2021-08-19 ENCOUNTER — Telehealth: Payer: Self-pay | Admitting: Internal Medicine

## 2021-08-19 NOTE — Telephone Encounter (Signed)
Pt is requesting a refill of her acetaminophen-codeine (TYLENOL/CODEINE #3) 300-30 MG tablet RX, ans is requesting it be called in, not faxed.   Please call RX into [id number]   Phone:  [phone]  Fax:  [phone]

## 2021-08-31 ENCOUNTER — Ambulatory Visit (INDEPENDENT_AMBULATORY_CARE_PROVIDER_SITE_OTHER)
Admission: RE | Admit: 2021-08-31 | Discharge: 2021-08-31 | Disposition: A | Discharge status: Home / Self Care | Payer: BC Managed Care – PPO | Source: Ambulatory Visit | Attending: Internal Medicine | Admitting: Internal Medicine

## 2021-08-31 DIAGNOSIS — M810 Age-related osteoporosis without current pathological fracture: Secondary | ICD-10-CM

## 2021-08-31 MED ORDER — ACETAMINOPHEN-CODEINE 300-30 MG PO TABS
1.0000 | ORAL_TABLET | Freq: Four times a day (QID) | ORAL | 0 refills | Status: DC | PRN
Start: 2021-08-31 — End: 2021-09-21

## 2021-09-01 DIAGNOSIS — M810 Age-related osteoporosis without current pathological fracture: Secondary | ICD-10-CM | POA: Diagnosis not present

## 2021-09-07 ENCOUNTER — Other Ambulatory Visit: Payer: Self-pay

## 2021-09-07 MED ORDER — LEVOTHYROXINE SODIUM 50 MCG PO TABS
50.0000 ug | ORAL_TABLET | Freq: Every day | ORAL | 2 refills | Status: DC
Start: 2021-09-07 — End: 2022-08-01

## 2021-09-11 ENCOUNTER — Other Ambulatory Visit: Payer: Self-pay | Admitting: Neurology

## 2021-09-12 ENCOUNTER — Other Ambulatory Visit: Payer: Self-pay | Admitting: Neurology

## 2021-09-12 NOTE — Telephone Encounter (Signed)
Left message to be seen per Dr.[name]

## 2021-09-20 ENCOUNTER — Telehealth: Payer: Self-pay | Admitting: Internal Medicine

## 2021-09-20 NOTE — Telephone Encounter (Signed)
Patient needs her tylenol #3 rx refilled - Please send to [facility] [address] in [place], [place].

## 2021-09-21 MED ORDER — ACETAMINOPHEN-CODEINE 300-30 MG PO TABS: 1.0000 | ORAL_TABLET | Freq: Four times a day (QID) | ORAL | 0 refills | Status: DC | PRN

## 2021-09-30 ENCOUNTER — Encounter: Payer: Self-pay | Admitting: Internal Medicine

## 2021-09-30 ENCOUNTER — Ambulatory Visit (INDEPENDENT_AMBULATORY_CARE_PROVIDER_SITE_OTHER): Payer: BC Managed Care – PPO | Admitting: Internal Medicine

## 2021-09-30 DIAGNOSIS — R32 Unspecified urinary incontinence: Secondary | ICD-10-CM | POA: Diagnosis not present

## 2021-09-30 DIAGNOSIS — I10 Essential (primary) hypertension: Secondary | ICD-10-CM

## 2021-09-30 DIAGNOSIS — J069 Acute upper respiratory infection, unspecified: Secondary | ICD-10-CM | POA: Diagnosis not present

## 2021-09-30 DIAGNOSIS — F411 Generalized anxiety disorder: Secondary | ICD-10-CM | POA: Diagnosis not present

## 2021-09-30 MED ORDER — CEFUROXIME AXETIL 250 MG PO TABS: 250.0000 mg | ORAL_TABLET | Freq: Two times a day (BID) | ORAL | 1 refills | Status: AC

## 2021-09-30 NOTE — Assessment & Plan Note (Signed)
BP Readings from Last 3 Encounters:  09/30/21 (!) 142/86  06/22/21 120/78  04/29/21 131/79   Unocntrolled, likely reactive,, pt to continue medical treatment toprol xl 50 qd

## 2021-09-30 NOTE — Patient Instructions (Signed)
Please take all new medication as prescribed - the ceftin   You will be contacted regarding the referral for: [facility]  Please continue all other medications as before, and refills have been done if requested.  Please have the pharmacy call with any other refills you may need.  Please keep your appointments with your specialists as you may have planned

## 2021-09-30 NOTE — Assessment & Plan Note (Addendum)
Mild to mod, for antibx course -ceftin,  to f/u any worsening symptoms or concerns

## 2021-09-30 NOTE — Assessment & Plan Note (Signed)
Persistent since pelvic floor surgury, despite bladder tack per pt - for urology referral

## 2021-09-30 NOTE — Assessment & Plan Note (Signed)
Overall chronic stable, declines need for change in tx or referral

## 2021-09-30 NOTE — Progress Notes (Signed)
Patient ID: [name], female   DOB: 12-04-1955, 66 y.o.   MRN: [id number]        Chief Complaint: follow up right ear pain, urinary incontinence, htn, anxiety       HPI:  [name] is a 66 y.o. female here with c/o 3 days onset severe right ear pain and pressure associated with sinus pain and pressure very uncomfortable on descent in airplane coming home yesterday.  Feels warm, took tylenol this am, helped the fever, not the pain.  Pt denies chest pain, increased sob or doe, wheezing, orthopnea, PND, increased LE swelling, palpitations, dizziness or syncope.   Pt denies polydipsia, polyuria, or new focal neuro s/s. Denies worsening depressive symptoms, suicidal ideation, or panic; has ongoing anxiety, Denies urinary symptoms such as dysuria, frequency, urgency, flank pain, hematuria or n/v, but has had essnentially free flow urine incontinence, wearing depends every day       Wt Readings from Last 3 Encounters:  04/25/21 199 lb 15.3 oz (90.7 kg)  02/03/21 200 lb (90.7 kg)  11/02/20 215 lb 3.2 oz (97.6 kg)   BP Readings from Last 3 Encounters:  09/30/21 (!) 142/86  06/22/21 120/78  04/29/21 131/79         Past Medical History:  Diagnosis Date   Abdominal pain, epigastric 10/25/2007   ADD 01/20/2009   AKI (acute kidney injury) ([place]) 04/11/2016   ALLERGIC RHINITIS 10/15/2006   ANGIOEDEMA 08/12/2008   ANXIETY 10/15/2006   ASTHMATIC BRONCHITIS, ACUTE 01/27/2008   Bowel obstruction (HCC)    Breast cancer (HCC)    Cellulitis and abscess of leg, except foot 08/12/2007   CELLULITIS, FACE 12/21/2008   CHB (complete heart block) ([place]) 04/14/2016   CHEST PAIN 06/14/2007   Chronic anticoagulation 05/16/2016   Chronic sinus infection 04/12/2010   COLECTOMY, PARTIAL, WITH ANASTOMOSIS, HX OF 10/15/2006   COLONIC POLYPS, HX OF 10/15/2006   CYST, OVARIAN NEC/NOS 10/15/2006   Dengue 02/22/2010   DVT, HX OF 10/15/2006   DYSAUTONOMIA 10/15/2006   Dysfunction of eustachian tube 05/26/2009   GI  BLEEDING 04/04/2007   Glossitis 03/06/2008   HEADACHE, CHRONIC 04/04/2007   Heart murmur    Hematemesis 10/25/2007   HEMORRHOIDS 04/04/2007   HYPERLIPIDEMIA 12/12/2007   HYPERTENSION 10/15/2006   Hypertrophic cardiomyopathy ([place])    HYPOTHYROIDISM 03/06/2008   IBS 04/04/2007   MASTECTOMY, BILATERAL, HX OF 08/12/2009   NECK MASS 07/01/2007   NEOP, MALIGNANT, FEMALE BREAST NOS 10/15/2006   NEOP, MALIGNANT, 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   Ovarian cancer ([place])    Presence of cardiac pacemaker 04/25/2016   Red blood cell antibody positive    K   Seizures ([place])    Subdural hematoma ([place])    SYNCOPE 07/01/2007   Wheezing 08/20/2008   WOUND, OPEN, LEG, WITHOUT COMPLICATION 7/42/5956   Past Surgical History:  Procedure Laterality Date   ABDOMINAL HYSTERECTOMY     APPENDECTOMY     BREAST RECONSTRUCTION     CARDIAC ELECTROPHYSIOLOGY MAPPING AND ABLATION     CESAREAN SECTION     CHOLECYSTECTOMY     COLON RESECTION  2005   COLON SURGERY  06/2020   COLONOSCOPY W/ BIOPSIES     FLEXIBLE SIGMOIDOSCOPY     LEFT AND RIGHT HEART CATHETERIZATION WITH CORONARY ANGIOGRAM N/A 10/07/2012   Procedure: LEFT AND RIGHT HEART CATHETERIZATION WITH CORONARY ANGIOGRAM;  Surgeon: Jolaine Artist, MD;  Location: [facility] CATH LAB;  Service: Cardiovascular;  Laterality: N/A;   MASTECTOMY Bilateral    MINIMALLY INVASIVE  TRICUSPID VALVE REPAIR     MITRAL VALVE REPAIR  2017   MITRAL VALVE REPLACEMENT     OOPHORECTOMY     SPIGELIAN HERNIA      reports that she has never smoked. She has never used smokeless tobacco. She reports that she does not currently use alcohol. She reports that she does not use drugs. family history includes Diabetes in her son; Heart disease in her mother; Melanoma in her father; Ovarian cancer in her sister; Pancreatic cancer in her father; Uterine cancer in her mother. Allergies  Allergen Reactions   Fish Allergy Anaphylaxis and Swelling    Scaled fish, like flounder   Penicillins Anaphylaxis, Hives and Rash    Levofloxacin In D5w Nausea And Vomiting and Swelling    Causes throat swelling   Nitroglycerin Other (See Comments)    Loss of consciousness    Pneumovax [Pneumococcal Polysaccharide Vaccine] Other (See Comments)    Rash, arm swelling   Sulfamethoxazole     Other reaction(s): GI bleeding Other reaction(s): Blood Disorder   Latex Dermatitis and Rash    If worn for more than a day, this causes blisters    Current Outpatient Medications on File Prior to Visit  Medication Sig Dispense Refill   acetaminophen-codeine (TYLENOL/CODEINE #3) 300-30 MG tablet Take 1 tablet by mouth every 6 (six) hours as needed. 120 tablet 0   albuterol (VENTOLIN HFA) 108 (90 Base) MCG/ACT inhaler Inhale 2 puffs into the lungs every 6 (six) hours as needed for wheezing or shortness of breath. 8 g 5   ketorolac (ACULAR) 0.5 % ophthalmic solution Place 1 drop into the right eye 4 (four) times daily.     metoprolol succinate (TOPROL-XL) 50 MG 24 hr tablet Take 50 mg by mouth daily.     Multiple Vitamins-Minerals (ZINC PO) Take 1 capsule by mouth daily.     ondansetron (ZOFRAN) 4 MG tablet Take 1 tablet (4 mg total) by mouth every 8 (eight) hours as needed for nausea or vomiting. 30 tablet 0   ondansetron (ZOFRAN-ODT) 8 MG disintegrating tablet Take 1 tablet (8 mg total) by mouth every 8 (eight) hours as needed for nausea. 30 tablet 0   venlafaxine XR (EFFEXOR-XR) 150 MG 24 hr capsule TAKE ONE CAPSULE DAILY 90 capsule 3   vitamin C (ASCORBIC ACID) 500 MG tablet Take 500 mg by mouth daily.      VITAMIN D PO Take 1 tablet by mouth daily.     warfarin (COUMADIN) 10 MG tablet Take 1 tablet daily or as directed by anticoagulation clinic 90 tablet 0   warfarin (COUMADIN) 5 MG tablet Take 5-10 mg by mouth daily. Take '5mg'$  by mouth on SAT MON WED FRI in the evening, then take '10mg'$  on SUN TUE THU.     zolpidem (AMBIEN) 10 MG tablet TAKE 1 TABLET BY MOUTH EVERY DAY AT BEDTIME AS NEEDED 90 tablet 1   zonisamide (ZONEGRAN) 100  MG capsule TAKE 5 CAPSULES BY MOUTH EVERY NIGHT 150 capsule 1   levothyroxine (SYNTHROID) 50 MCG tablet Take 1 tablet (50 mcg total) by mouth daily. TAKE 1 TABLET(50 MCG) BY MOUTH EVERY DAY Strength: 50 mcg (Patient not taking: Reported on 09/30/2021) 90 tablet 2   moxifloxacin (VIGAMOX) 0.5 % ophthalmic solution Place 1 drop into the right eye 4 (four) times daily.     prednisoLONE acetate (PRED FORTE) 1 % ophthalmic suspension Place 1 drop into the right eye 4 (four) times daily.     No current facility-administered  medications on file prior to visit.        ROS:  All others reviewed and negative.  Objective        PE:  BP (!) 142/86 (BP Location: Left Arm, Patient Position: Sitting, Cuff Size: Large)   Pulse 62   Temp 98.2 F (36.8 C) (Oral)   SpO2 94%                 Constitutional: Pt appears in NAD               HENT: Head: NCAT.                Right Ear: External ear normal.                 Left Ear: External ear normal.  Bilat tm's with mild erythema.  Max sinus areas mild tender.  Pharynx with mild erythema, no exudate               Eyes: . Pupils are equal, round, and reactive to light. Conjunctivae and EOM are normal               Nose: without d/c or deformity               Neck: Neck supple. Gross normal ROM               Cardiovascular: Normal rate and regular rhythm.                 Pulmonary/Chest: Effort normal and breath sounds without rales or wheezing.                Abd:  Soft, NT, ND, + BS, no organomegaly               Neurological: Pt is alert. At baseline orientation, motor grossly intact               Skin: Skin is warm. No rashes, no other new lesions, LE edema - none               Psychiatric: Pt behavior is normal without agitation   Micro: none  Cardiac tracings I have personally interpreted today:  none  Pertinent Radiological findings (summarize): none   Lab Results  Component Value Date   WBC 5.7 04/25/2021   HGB 13.4 04/25/2021   HCT 40.3  04/25/2021   PLT 238 04/25/2021   GLUCOSE 97 04/25/2021   CHOL 209 (H) 12/13/2011   TRIG 195.0 (H) 12/13/2011   HDL 45.10 12/13/2011   LDLDIRECT 137.4 12/13/2011   LDLCALC 73 02/11/2008   ALT 22 04/25/2021   AST 27 04/25/2021   NA 139 04/25/2021   K 4.0 04/25/2021   CL 108 04/25/2021   CREATININE 0.98 04/25/2021   BUN 14 04/25/2021   CO2 24 04/25/2021   TSH 3.04 10/02/2012   INR 2.5 (H) 04/29/2021   HGBA1C 5.1 04/25/2021   Assessment/Plan:  [name] is a 66 y.o. White or Caucasian [1] female with  has a past medical history of Abdominal pain, epigastric (10/25/2007), ADD (01/20/2009), AKI (acute kidney injury) ([place]) (04/11/2016), ALLERGIC RHINITIS (10/15/2006), ANGIOEDEMA (08/12/2008), ANXIETY (10/15/2006), ASTHMATIC BRONCHITIS, ACUTE (01/27/2008), Bowel obstruction ([place]), Breast cancer ([place]), Cellulitis and abscess of leg, except foot (08/12/2007), CELLULITIS, FACE (12/21/2008), CHB (complete heart block) ([place]) (04/14/2016), CHEST PAIN (06/14/2007), Chronic anticoagulation (05/16/2016), Chronic sinus infection (04/12/2010), COLECTOMY, PARTIAL, WITH ANASTOMOSIS, HX OF (10/15/2006), COLONIC POLYPS, HX OF (10/15/2006), CYST, OVARIAN  NEC/NOS (10/15/2006), Dengue (02/22/2010), DVT, HX OF (10/15/2006), DYSAUTONOMIA (10/15/2006), Dysfunction of eustachian tube (05/26/2009), GI BLEEDING (04/04/2007), Glossitis (03/06/2008), HEADACHE, CHRONIC (04/04/2007), Heart murmur, Hematemesis (10/25/2007), HEMORRHOIDS (04/04/2007), HYPERLIPIDEMIA (12/12/2007), HYPERTENSION (10/15/2006), Hypertrophic cardiomyopathy ([place]), HYPOTHYROIDISM (03/06/2008), IBS (04/04/2007), MASTECTOMY, BILATERAL, HX OF (08/12/2009), NECK MASS (07/01/2007), NEOP, MALIGNANT, FEMALE BREAST NOS (10/15/2006), NEOP, MALIGNANT, THYMUS (10/15/2006), Ovarian cancer ([place]), Presence of cardiac pacemaker (04/25/2016), Red blood cell antibody positive, Seizures ([place]), Subdural hematoma ([place]), SYNCOPE (07/01/2007), Wheezing (08/20/2008), and WOUND, OPEN, LEG, WITHOUT  COMPLICATION (4/58/5929).  Acute upper respiratory infection Mild to mod, for antibx course -ceftin,  to f/u any worsening symptoms or concerns  Urinary incontinence Persistent since pelvic floor surgury, despite bladder tack per pt - for urology referral  Essential hypertension BP Readings from Last 3 Encounters:  09/30/21 (!) 142/86  06/22/21 120/78  04/29/21 131/79   Unocntrolled, likely reactive,, pt to continue medical treatment toprol xl 50 qd   Anxiety state Overall chronic stable, declines need for change in tx or referral  Followup: Return if symptoms worsen or fail to improve.  Cathlean Cower, MD 09/30/2021 12:52 PM [facility]

## 2021-10-13 DIAGNOSIS — I422 Other hypertrophic cardiomyopathy: Secondary | ICD-10-CM | POA: Diagnosis not present

## 2021-10-13 DIAGNOSIS — I480 Paroxysmal atrial fibrillation: Secondary | ICD-10-CM | POA: Diagnosis not present

## 2021-10-13 DIAGNOSIS — Z954 Presence of other heart-valve replacement: Secondary | ICD-10-CM | POA: Diagnosis not present

## 2021-10-27 ENCOUNTER — Telehealth: Payer: Self-pay | Admitting: Internal Medicine

## 2021-10-27 NOTE — Telephone Encounter (Signed)
Caller & Relationship to patient: SELF Call Back Number: [phone] Date of Last Office Visit: 09/30/21 Date of Next Office Visit: 12/22/21 Medication(s) to be Refilled: TYLENOL/CODINE Preferred Pharmacy: [name] states she has to call every month to request the refill so it will be filled in a timely manner.   ** Please notify patient to allow 48-72 hours to process** **Let patient know to contact pharmacy at the end of the day to make sure medication is ready. ** **If patient has not been seen in a year or longer, book an appointment **Advise to use MyChart for refill requests OR to contact their pharmacy

## 2021-11-01 MED ORDER — ACETAMINOPHEN-CODEINE 300-30 MG PO TABS
1.0000 | ORAL_TABLET | Freq: Four times a day (QID) | ORAL | 0 refills | Status: DC | PRN
Start: 2021-11-01 — End: 2021-12-01

## 2021-11-21 ENCOUNTER — Telehealth: Payer: Self-pay | Admitting: Anesthesiology

## 2021-11-21 NOTE — Telephone Encounter (Signed)
Pt called requesting a refill on her medication:  1. Which medications need refilled? Zonisamide 100 mg  2. Which pharmacy/location is medication to be sent to? [facility]   3. Do they need a 30 day or 90 day supply? 90 day supply

## 2021-11-22 ENCOUNTER — Other Ambulatory Visit: Payer: Self-pay | Admitting: Neurology

## 2021-11-22 ENCOUNTER — Other Ambulatory Visit: Payer: Self-pay

## 2021-11-22 DIAGNOSIS — R4689 Other symptoms and signs involving appearance and behavior: Secondary | ICD-10-CM

## 2021-11-22 DIAGNOSIS — R55 Syncope and collapse: Secondary | ICD-10-CM

## 2021-11-22 MED ORDER — ZONISAMIDE 100 MG PO CAPS: ORAL_CAPSULE | ORAL | 0 refills | Status: DC

## 2021-11-22 NOTE — Telephone Encounter (Signed)
Called patient and let her know we can send medication to get her to her scheduled appointment in 12/28/21 but would not be able to refill again without an appointment

## 2021-11-22 NOTE — Telephone Encounter (Signed)
Patient was scheduled for her follow up with Dr Delice Lesch 12/28/21.  Patient would like to speak to the nurse. Requesting a call back.

## 2021-11-29 ENCOUNTER — Telehealth: Payer: Self-pay | Admitting: Internal Medicine

## 2021-11-29 NOTE — Telephone Encounter (Signed)
Patient needs a refill on there tylenol with codeine - please send to [facility] in [place] [address]    Patient has an appointment on 12/20 2023

## 2021-12-01 MED ORDER — ACETAMINOPHEN-CODEINE 300-30 MG PO TABS
1.0000 | ORAL_TABLET | Freq: Four times a day (QID) | ORAL | 0 refills | Status: DC | PRN
Start: 2021-12-01 — End: 2021-12-01

## 2021-12-01 MED ORDER — ACETAMINOPHEN-CODEINE 300-30 MG PO TABS: 1.0000 | ORAL_TABLET | Freq: Four times a day (QID) | ORAL | 0 refills | Status: DC | PRN

## 2021-12-01 NOTE — Telephone Encounter (Signed)
Ok done erx 

## 2021-12-22 ENCOUNTER — Ambulatory Visit: Payer: BC Managed Care – PPO | Admitting: Internal Medicine

## 2021-12-28 ENCOUNTER — Encounter: Payer: Self-pay | Admitting: Neurology

## 2021-12-28 ENCOUNTER — Telehealth (INDEPENDENT_AMBULATORY_CARE_PROVIDER_SITE_OTHER): Payer: BC Managed Care – PPO | Admitting: Neurology

## 2021-12-28 DIAGNOSIS — R55 Syncope and collapse: Secondary | ICD-10-CM | POA: Diagnosis not present

## 2021-12-28 DIAGNOSIS — R4689 Other symptoms and signs involving appearance and behavior: Secondary | ICD-10-CM

## 2021-12-28 MED ORDER — ZONISAMIDE 100 MG PO CAPS: ORAL_CAPSULE | ORAL | 3 refills | Status: DC

## 2021-12-28 NOTE — Patient Instructions (Signed)
Good to see you.  Continue Zonisamide '400mg'$  every night  2. Referral for Cognitive Behavioral Therapy to hopefully help reduce episodes triggered by stress  3. Follow-up in 6 months, call for any changes    Seizure Precautions: 1. If medication has been prescribed for you to prevent seizures, take it exactly as directed.  Do not stop taking the medicine without talking to your doctor first, even if you have not had a seizure in a long time.   2. Avoid activities in which a seizure would cause danger to yourself or to others.  Don't operate dangerous machinery, swim alone, or climb in high or dangerous places, such as on ladders, roofs, or girders.  Do not drive unless your doctor says you may.  3. If you have any warning that you may have a seizure, lay down in a safe place where you can't hurt yourself.    4.  No driving for 6 months from last seizure, as per [facility].   Please refer to the following link on the [facility] website for more information: http://www.epilepsyfoundation.org/answerplace/Social/driving/drivingu.cfm   5.  Maintain good sleep hygiene. Avoid alcohol.  6.  Contact your doctor if you have any problems that may be related to the medicine you are taking.  7.  Call 911 and bring the patient back to the ED if:        A.  The seizure lasts longer than 5 minutes.       B.  The patient doesn't awaken shortly after the seizure  C.  The patient has new problems such as difficulty seeing, speaking or moving  D.  The patient was injured during the seizure  E.  The patient has a temperature over 102 F (39C)  F.  The patient vomited and now is having trouble breathing

## 2021-12-28 NOTE — Progress Notes (Signed)
Virtual Visit via Video Note The purpose of this virtual visit is to provide medical care while limiting exposure to the novel coronavirus.    Consent was obtained for video visit:  Yes.   Answered questions that patient had about telehealth interaction:  Yes.   I discussed the limitations, risks, security and privacy concerns of performing an evaluation and management service by telemedicine. I also discussed with the patient that there may be a patient responsible charge related to this service. The patient expressed understanding and agreed to proceed.  Pt location: Home Physician Location: office Name of referring provider:  Biagio Borg, MD I connected with [name] at patients initiation/request on 12/28/2021 at 11:30 AM EST by video enabled telemedicine application and verified that I am speaking with the correct person using two identifiers. Pt MRN:  [id number] Pt DOB:  1955-03-29 Video Participants:  [name]   History of Present Illness:  The patient had a virtual video visit on 12/28/2021. She was last seen in the neurology clinic 10 months ago for a diagnosis of seizures. She started having near daily episodes at age 15 with flashing lights in her vision with abnormal eye movements, she would have loss of control of her eye movements and would be told her eyes move in random directions. There would be loss of speech and unresponsiveness and she loses ability to maintain posture. She had video EEG monitoring at [facility] in 2020, baseline EEG normal, 2 spells that were not her typical symptoms did not show any EEG changes. Epileptologist Dr. Lysle Rubens noted etiology of spells still unclear if epileptic or non-epileptic, however since she was doing well with Zonisamide which was also helping with headaches, he recommended continuation of medication. On her visit in February, she reported continued seizures on Zonisamide '500mg'$  qhs. She was admitted for video EEG monitoring at [facility] from April 24-28, 2023 where Zonisamide was discontinued. She reported that events can be triggered by looking down and exercising, this was attempted but did not provoke an event. Typical events were not recorded. Her EEG was within normal limits. During her stay, she reported intermittent chest pain, pacemaker was interrogated and noted to have nonsustained brief runs of VT. The timing of most recent run of VT coincided with patient's most recent event. 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 It was noted that the number of episodes she has does not correlate with daily symptoms or amount of time she states she loses consciousness. She states that "they keep saying they think it's nerves."   She is happy to report that after her pacemaker was "tweaked," it was very,very helpful. She still has the seizures but instead of "being outward,they are inward." The "shake and back" is all inside, she can feel them going on but it is more internal, there is no visible shaking of extremities. Family "can see it in my eyes" but she has not fully lost consciousness since July 2023. She states "I have these seizures but it's more momentary," and they definitely happen when she is under stress, be it happy or being upset. She has them 5 days a week. When she cut down on Zonisamide to '200mg'$ , she was having more of them, and increased back to '400mg'$  qhs. No side effects. She reports "stress definitely brings them on."    History on Initial Assessment 11/02/2020: This is  a 66 year old right-handed woman with a history of hypertension, hyperlipidemia, hypothyroidism, complete heart block, presenting to establish care for seizures. Records from [facility] were reviewed and will be summarized below. She has seen several neurologists, she was evaluated by epileptologist Dr. Lysle Rubens, last visit was in January 2021. She started  having recurrent episodes at age 73 where she would have near daily episodes of flashing lights in her vision with abnormal eye movements. She would have loss of control of her eye movements and would be told her eyes move in random directions. There would be loss of speech and unresponsiveness and she loses ability to maintain posture. She would have an aura of double vision and sit herself down. She would get a pillow because she "seems to throw myself around or fall out." Episodes would last a few minutes to up to 30 minutes, she would have no recollection of events and would take 3-4 hours to return to baseline. She would feel tired and cold. There would be no rhythmic jerking of extremities. Symptoms were initially attributed to ocular migraines and she tried Topiramate and Diamox but could not tolerate them. She was started on Zonisamide with note of reduction in events. They typically occur when she is up and about, no nocturnal episodes or events when supine. She had 2 normal routine EEGs, then underwent video EEG monitoring at [facility] from 09/30/2018 to 10/05/2018 where baseline EEG was normal. There were 2 events where she felt her face becoming flushed and feeling weird in her chest, no EEG correlate seen.  During her admission, she had a brain MRI showing an incidental subdural hematoma in the posterior fossa. CT head repeated a week after showed resolving size of SDH. Per Dr. Glenna Durand note, etiology of spells still unclear if epileptic or non-epileptic, however since she was doing well with Zonisamide which was also helping with headaches, he recommended continuation of medication. Her last visit at [facility] in 07/2019 indicated that recurrent spells were felt to be likely related to stress and/or atypical migraine, and she was referred to [facility]. Zonisamide increased to '300mg'$  qhs for symptom management.  She had been doing well with rare episode occurring once a month, however she ran out of  Zonisamide for a month and only restarted it last week. She had an episode of yesterday. She knew she was starting to have it, she would look up to the ceiling then try to get herself to a sofa. While it is going on, she hyperventilates and gets really cold, losing track of time. She denies any associated tongue bite. She has a history of urinary incontinence and wears adult diapers, so it is hard to tell if she had associated incontinence. She denies any olfactory/gustatory hallucinations, deja vu, rising epigastric sensation, focal numbness/tingling/weakness, myoclonic jerks. She denies any side effects on Zonisamide '300mg'$  qhs, she reports she had taken up to '600mg'$  qhs at one point but cut herself down and was event-free for a year at that time. She denies any headaches, dizziness. She gets 6-7 hours of interrupted sleep and feels it is not enough. When asked about mood, she states "everybody tells me I'm ridiculously positive," however she notes being under a lot of stress, and that stress can bring on an episode. Her son with a TBI lives with them.  She has been on venlafaxine since age 66 for hot flashes, but when she tries to get off medication, she is "a crying mess in 2 days." She notes a  history of being diagnosed with dysautonomia with hospice nurses coming twice a week because she was terminal, this occurred from 2006 to 2010, then a minister from [place] prayed for her and she was instantly healed.   Epilepsy Risk Factors:  She had a normal birth and early development.  There is no history of febrile convulsions, CNS infections such as meningitis/encephalitis, significant traumatic brain injury, neurosurgical procedures, or family history of seizures.  Prior AEDs: Topiramate   Current Outpatient Medications on File Prior to Visit  Medication Sig Dispense Refill   acetaminophen-codeine (TYLENOL/CODEINE #3) 300-30 MG tablet Take 1 tablet by mouth every 6 (six) hours as needed. 120 tablet 0    albuterol (VENTOLIN HFA) 108 (90 Base) MCG/ACT inhaler Inhale 2 puffs into the lungs every 6 (six) hours as needed for wheezing or shortness of breath. 8 g 5   levothyroxine (SYNTHROID) 50 MCG tablet Take 1 tablet (50 mcg total) by mouth daily. TAKE 1 TABLET(50 MCG) BY MOUTH EVERY DAY Strength: 50 mcg 90 tablet 2   metoprolol succinate (TOPROL-XL) 50 MG 24 hr tablet Take 50 mg by mouth daily.     Multiple Vitamins-Minerals (ZINC PO) Take 1 capsule by mouth daily.     ondansetron (ZOFRAN) 4 MG tablet Take 1 tablet (4 mg total) by mouth every 8 (eight) hours as needed for nausea or vomiting. 30 tablet 0   ondansetron (ZOFRAN-ODT) 8 MG disintegrating tablet Take 1 tablet (8 mg total) by mouth every 8 (eight) hours as needed for nausea. 30 tablet 0   venlafaxine XR (EFFEXOR-XR) 150 MG 24 hr capsule TAKE ONE CAPSULE DAILY 90 capsule 3   vitamin C (ASCORBIC ACID) 500 MG tablet Take 500 mg by mouth daily.      VITAMIN D PO Take 1 tablet by mouth daily.     warfarin (COUMADIN) 10 MG tablet Take 1 tablet daily or as directed by anticoagulation clinic 90 tablet 0   warfarin (COUMADIN) 5 MG tablet Take 5-10 mg by mouth daily. Take '5mg'$  by mouth on SAT MON WED FRI in the evening, then take '10mg'$  on SUN TUE THU.     zolpidem (AMBIEN) 10 MG tablet TAKE 1 TABLET BY MOUTH EVERY DAY AT BEDTIME AS NEEDED 90 tablet 1   zonisamide (ZONEGRAN) 100 MG capsule TAKE 5 CAPSULES BY MOUTH EVERY NIGHT 200 capsule 0   No current facility-administered medications on file prior to visit.     Observations/Objective:   Vitals:   12/28/21 1021  Weight: 205 lb (93 kg)  Height: '5\' 9"'$  (1.753 m)   GEN:  The patient appears stated age and is in NAD.  Neurological examination: Patient is awake, alert. No aphasia or dysarthria. Intact fluency and comprehension. Cranial nerves: Extraocular movements intact. No facial asymmetry. Motor: moves all extremities symmetrically, at least anti-gravity x 4.    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 Prior monitoring at [facility] in 2020 was normal, recent monitoring at [facility] for 5 days was also normal, however typical events were not captured. She continues to report episodes occurring 5 days a week, and notes that "stress is definitely a trigger." With 2 EMU admissions with normal EEG, non-epileptic events are higher on the differential. With stress being a trigger, we discussed doing CBT to hopefully help cut down on episodes. She has seen her cardiologist  and notes an improvement with pacemaker changes. No loss of consciousness since July 2023. Continue Zonisamide '400mg'$  qhs, it was helping with headaches as well. She is aware of [place] driving laws to stop driving after an episode of loss of consciousness until 6 months event-free. Follow-up in 6 months, call for any changes.    Follow Up Instructions:   -I discussed the assessment and treatment plan with the patient. The patient was provided an opportunity to ask questions and all were answered. The patient agreed with the plan and demonstrated an understanding of the instructions.   The patient was advised to call back or seek an in-person evaluation if the symptoms worsen or if the condition fails to improve as anticipated.     Cameron Sprang, MD

## 2021-12-30 ENCOUNTER — Telehealth: Payer: Self-pay

## 2021-12-30 MED ORDER — ACETAMINOPHEN-CODEINE 300-30 MG PO TABS
1.0000 | ORAL_TABLET | Freq: Four times a day (QID) | ORAL | 0 refills | Status: DC | PRN
Start: 2021-12-30 — End: 2022-01-31

## 2021-12-30 NOTE — Telephone Encounter (Signed)
Pt called and stated she is needing a rx refill for her   acetaminophen-codeine (TYLENOL/CODEINE #3) 300-30 MG tablet

## 2021-12-30 NOTE — Telephone Encounter (Signed)
Done erx 

## 2022-01-14 ENCOUNTER — Other Ambulatory Visit: Payer: Self-pay | Admitting: Neurology

## 2022-01-14 DIAGNOSIS — R55 Syncope and collapse: Secondary | ICD-10-CM

## 2022-01-14 DIAGNOSIS — R4689 Other symptoms and signs involving appearance and behavior: Secondary | ICD-10-CM

## 2022-01-30 NOTE — Telephone Encounter (Signed)
Patient called and states she needs a refill on her acetaminophen-codeine (tylenol/codeine #3) 300-'30mg'$  tablet.

## 2022-01-31 MED ORDER — ACETAMINOPHEN-CODEINE 300-30 MG PO TABS: 1.0000 | ORAL_TABLET | Freq: Four times a day (QID) | ORAL | 0 refills | Status: DC | PRN

## 2022-01-31 NOTE — Telephone Encounter (Signed)
Done erx 

## 2022-01-31 NOTE — Addendum Note (Signed)
Addended by: Biagio Borg on: 01/31/2022 03:45 PM   Modules accepted: Orders

## 2022-02-27 DIAGNOSIS — I422 Other hypertrophic cardiomyopathy: Secondary | ICD-10-CM | POA: Diagnosis not present

## 2022-02-27 DIAGNOSIS — Z7901 Long term (current) use of anticoagulants: Secondary | ICD-10-CM | POA: Diagnosis not present

## 2022-02-27 DIAGNOSIS — Z5181 Encounter for therapeutic drug level monitoring: Secondary | ICD-10-CM | POA: Diagnosis not present

## 2022-02-28 DIAGNOSIS — I422 Other hypertrophic cardiomyopathy: Secondary | ICD-10-CM | POA: Diagnosis not present

## 2022-03-02 ENCOUNTER — Telehealth: Payer: Self-pay | Admitting: Internal Medicine

## 2022-03-02 NOTE — Telephone Encounter (Signed)
MEDICATION:acetaminophen-codeine (TYLENOL/CODEINE #3) 300-30 MG tablet   PHARMACY:[id number] - [place], [address]   Comments:   **Let patient know to contact pharmacy at the end of the day to make sure medication is ready. **  ** Please notify patient to allow 48-72 hours to process**  **Encourage patient to contact the pharmacy for refills or they can request refills through [facility]**

## 2022-03-03 MED ORDER — ACETAMINOPHEN-CODEINE 300-30 MG PO TABS
1.0000 | ORAL_TABLET | Freq: Four times a day (QID) | ORAL | 0 refills | Status: DC | PRN
Start: 2022-03-03 — End: 2022-04-04

## 2022-03-03 NOTE — Telephone Encounter (Signed)
Done erx 

## 2022-03-07 DIAGNOSIS — Z954 Presence of other heart-valve replacement: Secondary | ICD-10-CM | POA: Diagnosis not present

## 2022-03-08 DIAGNOSIS — I422 Other hypertrophic cardiomyopathy: Secondary | ICD-10-CM | POA: Diagnosis not present

## 2022-03-22 DIAGNOSIS — Z7901 Long term (current) use of anticoagulants: Secondary | ICD-10-CM | POA: Diagnosis not present

## 2022-03-22 DIAGNOSIS — I422 Other hypertrophic cardiomyopathy: Secondary | ICD-10-CM | POA: Diagnosis not present

## 2022-04-03 ENCOUNTER — Telehealth: Payer: Self-pay | Admitting: Internal Medicine

## 2022-04-03 NOTE — Telephone Encounter (Signed)
Prescription Request  04/03/2022  LOV: 09/30/2021  What is the name of the medication or equipment? acetaminophen-codeine (TYLENOL/CODEINE #3) 300-30 MG tablet   Have you contacted your pharmacy to request a refill? Yes   Which pharmacy would you like this sent to?  [facility] [place] ([facility] [place] [address] Phone: [phone] Fax: [phone]     Patient notified that their request is being sent to the clinical staff for review and that they should receive a response within 2 business days.   Please advise at Mobile [phone] (mobile)

## 2022-04-04 MED ORDER — ACETAMINOPHEN-CODEINE 300-30 MG PO TABS: 1.0000 | ORAL_TABLET | Freq: Four times a day (QID) | ORAL | 0 refills | Status: DC | PRN

## 2022-04-04 NOTE — Telephone Encounter (Signed)
Done erx 

## 2022-04-18 DIAGNOSIS — Z7901 Long term (current) use of anticoagulants: Secondary | ICD-10-CM | POA: Diagnosis not present

## 2022-04-19 DIAGNOSIS — I422 Other hypertrophic cardiomyopathy: Secondary | ICD-10-CM | POA: Diagnosis not present

## 2022-04-27 ENCOUNTER — Telehealth: Payer: Self-pay

## 2022-04-27 NOTE — Telephone Encounter (Signed)
Called patient to schedule Medicare Annual Wellness Visit (AWV). Left message for patient to call back and schedule Medicare Annual Wellness Visit (AWV).  Last date of AWV: 04/04/21  Please schedule an appointment at any time On Annual Wellness Visit Schedule.

## 2022-05-02 ENCOUNTER — Telehealth: Payer: Self-pay | Admitting: Internal Medicine

## 2022-05-02 MED ORDER — ACETAMINOPHEN-CODEINE 300-30 MG PO TABS: 1.0000 | ORAL_TABLET | Freq: Four times a day (QID) | ORAL | 0 refills | Status: DC | PRN

## 2022-05-02 NOTE — Telephone Encounter (Signed)
Done erx 

## 2022-05-02 NOTE — Telephone Encounter (Signed)
Prescription Request  05/02/2022  LOV: 09/30/2021  What is the name of the medication or equipment? Tylenol #3  Have you contacted your pharmacy to request a refill? Yes   Which pharmacy would you like this sent to?  [facility] [address] [place], [place] - [address] AT [facility] [address] [place] [address] Phone: [phone] Fax: [phone]     Patient notified that their request is being sent to the clinical staff for review and that they should receive a response within 2 business days.   Please advise at Mobile [phone] (mobile)

## 2022-06-22 ENCOUNTER — Other Ambulatory Visit: Payer: Self-pay | Admitting: Internal Medicine

## 2022-06-29 ENCOUNTER — Ambulatory Visit (INDEPENDENT_AMBULATORY_CARE_PROVIDER_SITE_OTHER): Payer: BC Managed Care – PPO | Admitting: Internal Medicine

## 2022-06-29 ENCOUNTER — Encounter: Payer: Self-pay | Admitting: Internal Medicine

## 2022-06-29 VITALS — BP 122/74 | HR 60 | Temp 98.1°F | Ht 69.0 in

## 2022-06-29 DIAGNOSIS — E785 Hyperlipidemia, unspecified: Secondary | ICD-10-CM

## 2022-06-29 DIAGNOSIS — E559 Vitamin D deficiency, unspecified: Secondary | ICD-10-CM | POA: Diagnosis not present

## 2022-06-29 DIAGNOSIS — E039 Hypothyroidism, unspecified: Secondary | ICD-10-CM

## 2022-06-29 DIAGNOSIS — R739 Hyperglycemia, unspecified: Secondary | ICD-10-CM | POA: Diagnosis not present

## 2022-06-29 DIAGNOSIS — I10 Essential (primary) hypertension: Secondary | ICD-10-CM

## 2022-06-29 DIAGNOSIS — H6990 Unspecified Eustachian tube disorder, unspecified ear: Secondary | ICD-10-CM | POA: Diagnosis not present

## 2022-06-29 DIAGNOSIS — H6693 Otitis media, unspecified, bilateral: Secondary | ICD-10-CM | POA: Diagnosis not present

## 2022-06-29 LAB — CBC WITH DIFFERENTIAL/PLATELET
Basophils Absolute: 0 10*3/uL (ref 0.0–0.1)
Basophils Relative: 0.7 % (ref 0.0–3.0)
Eosinophils Absolute: 0.2 10*3/uL (ref 0.0–0.7)
Eosinophils Relative: 2.9 % (ref 0.0–5.0)
HCT: 39.5 % (ref 36.0–46.0)
Hemoglobin: 13.3 g/dL (ref 12.0–15.0)
Lymphocytes Relative: 25.6 % (ref 12.0–46.0)
Lymphs Abs: 1.7 10*3/uL (ref 0.7–4.0)
MCHC: 33.6 g/dL (ref 30.0–36.0)
MCV: 86.6 fl (ref 78.0–100.0)
Monocytes Absolute: 0.5 10*3/uL (ref 0.1–1.0)
Monocytes Relative: 8 % (ref 3.0–12.0)
Neutro Abs: 4.1 10*3/uL (ref 1.4–7.7)
Neutrophils Relative %: 62.8 % (ref 43.0–77.0)
Platelets: 271 10*3/uL (ref 150.0–400.0)
RBC: 4.56 Mil/uL (ref 3.87–5.11)
RDW: 13.2 % (ref 11.5–15.5)
WBC: 6.5 10*3/uL (ref 4.0–10.5)

## 2022-06-29 LAB — LIPID PANEL
Cholesterol: 219 mg/dL — ABNORMAL HIGH (ref 0–200)
HDL: 41 mg/dL (ref >39.00)
NonHDL: 178.47
Total CHOL/HDL Ratio: 5
Triglycerides: 320 mg/dL — ABNORMAL HIGH (ref 0.0–149.0)
VLDL: 64 mg/dL — ABNORMAL HIGH (ref 0.0–40.0)

## 2022-06-29 LAB — BASIC METABOLIC PANEL
BUN: 15 mg/dL (ref 6–23)
CO2: 24 mEq/L (ref 19–32)
Calcium: 9.2 mg/dL (ref 8.4–10.5)
Chloride: 110 mEq/L (ref 96–112)
Creatinine, Ser: 0.92 mg/dL (ref 0.40–1.20)
GFR: 64.38 mL/min (ref >60.00)
Glucose, Bld: 92 mg/dL (ref 70–99)
Potassium: 3.8 mEq/L (ref 3.5–5.1)
Sodium: 141 mEq/L (ref 135–145)

## 2022-06-29 LAB — TSH: TSH: 3.82 u[IU]/mL (ref 0.35–5.50)

## 2022-06-29 LAB — LDL CHOLESTEROL, DIRECT: Direct LDL: 141 mg/dL

## 2022-06-29 LAB — VITAMIN D 25 HYDROXY (VIT D DEFICIENCY, FRACTURES): VITD: 24.44 ng/mL — ABNORMAL LOW (ref 30.00–100.00)

## 2022-06-29 LAB — HEPATIC FUNCTION PANEL
ALT: 20 U/L (ref 0–35)
AST: 20 U/L (ref 0–37)
Albumin: 4.2 g/dL (ref 3.5–5.2)
Alkaline Phosphatase: 116 U/L (ref 39–117)
Bilirubin, Direct: 0.1 mg/dL (ref 0.0–0.3)
Total Bilirubin: 0.4 mg/dL (ref 0.2–1.2)
Total Protein: 6.7 g/dL (ref 6.0–8.3)

## 2022-06-29 LAB — HEMOGLOBIN A1C: Hgb A1c MFr Bld: 5.3 % (ref 4.6–6.5)

## 2022-06-29 MED ORDER — ACETAMINOPHEN-CODEINE 300-30 MG PO TABS: 1.0000 | ORAL_TABLET | Freq: Four times a day (QID) | ORAL | 0 refills | Status: DC | PRN

## 2022-06-29 MED ORDER — CEFUROXIME AXETIL 250 MG PO TABS: 250.0000 mg | ORAL_TABLET | Freq: Two times a day (BID) | ORAL | 0 refills | Status: AC

## 2022-06-29 MED ORDER — VENLAFAXINE HCL ER 150 MG PO CP24: 150.0000 mg | ORAL_CAPSULE | Freq: Every day | ORAL | 3 refills | Status: DC

## 2022-06-29 NOTE — Patient Instructions (Signed)
Please take all new medication as prescribed - the antibiotic  Please continue all other medications as before, and refills have been done if requested - tylenol #3, and effexor  Please have the pharmacy call with any other refills you may need.  Please continue your efforts at being more active, low cholesterol diet, and weight control.  Please keep your appointments with your specialists as you may have planned  Please go to the LAB at the blood drawing area for the tests to be done  You will be contacted by phone if any changes need to be made immediately.  Otherwise, you will receive a letter about your results with an explanation, but please check with MyChart first.  Please remember to sign up for MyChart if you have not done so, as this will be important to you in the future with finding out test results, communicating by private email, and scheduling acute appointments online when needed.  Daisey Must with your septum surgury!  Please make an Appointment to return in 6 months, or sooner if needed

## 2022-06-29 NOTE — Progress Notes (Signed)
Patient ID: [name], female   DOB: 1955/10/11, 67 y.o.   MRN: [id number]        Chief Complaint: follow up bilateral ear pain, eustachian valve dysfxn, htn, HCM, low thyroid   ,      HPI:  [name] is a 66 y.o. female here with bilateral ear pain with feverish, and left ear popping and crackling x 3 days, mild to mod chronic, Pt denies chest pain, increased sob or doe, wheezing, orthopnea, PND, increased LE swelling, palpitations, dizziness or syncope.   Pt denies polydipsia, polyuria, or new focal neuro s/s.   Pt denies fever, wt loss, night sweats, loss of appetite, or other constitutional symptoms  Due for 3rd cardiac procedure for enlarged cardiac septum.  Denies hyper or hypo thyroid symptoms such as voice, skin or hair change.         Wt Readings from Last 3 Encounters:  12/28/21 205 lb (93 kg)  04/25/21 199 lb 15.3 oz (90.7 kg)  02/03/21 200 lb (90.7 kg)   BP Readings from Last 3 Encounters:  06/29/22 122/74  09/30/21 (!) 142/86  06/22/21 120/78         Past Medical History:  Diagnosis Date   Abdominal pain, epigastric 10/25/2007   ADD 01/20/2009   AKI (acute kidney injury) (HCC) 04/11/2016   ALLERGIC RHINITIS 10/15/2006   ANGIOEDEMA 08/12/2008   ANXIETY 10/15/2006   ASTHMATIC BRONCHITIS, ACUTE 01/27/2008   Bowel obstruction (HCC)    Breast cancer (HCC)    Cellulitis and abscess of leg, except foot 08/12/2007   CELLULITIS, FACE 12/21/2008   CHB (complete heart block) (HCC) 04/14/2016   CHEST PAIN 06/14/2007   Chronic anticoagulation 05/16/2016   Chronic sinus infection 04/12/2010   COLECTOMY, PARTIAL, WITH ANASTOMOSIS, HX OF 10/15/2006   COLONIC POLYPS, HX OF 10/15/2006   CYST, OVARIAN NEC/NOS 10/15/2006   Dengue 02/22/2010   DVT, HX OF 10/15/2006   DYSAUTONOMIA 10/15/2006   Dysfunction of eustachian tube 05/26/2009   GI BLEEDING 04/04/2007   Glossitis 03/06/2008   HEADACHE, CHRONIC 04/04/2007   Heart murmur    Hematemesis 10/25/2007   HEMORRHOIDS 04/04/2007    HYPERLIPIDEMIA 12/12/2007   HYPERTENSION 10/15/2006   Hypertrophic cardiomyopathy (HCC)    HYPOTHYROIDISM 03/06/2008   IBS 04/04/2007   MASTECTOMY, BILATERAL, HX OF 08/12/2009   NECK MASS 07/01/2007   NEOP, MALIGNANT, FEMALE BREAST NOS 10/15/2006   NEOP, MALIGNANT, 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   Ovarian cancer (HCC)    Presence of cardiac pacemaker 04/25/2016   Red blood cell antibody positive    K   Seizures (HCC)    Subdural hematoma (HCC)    SYNCOPE 07/01/2007   Wheezing 08/20/2008   WOUND, OPEN, LEG, WITHOUT COMPLICATION 08/16/2007   Past Surgical History:  Procedure Laterality Date   ABDOMINAL HYSTERECTOMY     APPENDECTOMY     BREAST RECONSTRUCTION     CARDIAC ELECTROPHYSIOLOGY MAPPING AND ABLATION     CESAREAN SECTION     CHOLECYSTECTOMY     COLON RESECTION  2005   COLON SURGERY  06/2020   COLONOSCOPY W/ BIOPSIES     FLEXIBLE SIGMOIDOSCOPY     LEFT AND RIGHT HEART CATHETERIZATION WITH CORONARY ANGIOGRAM N/A 10/07/2012   Procedure: LEFT AND RIGHT HEART CATHETERIZATION WITH CORONARY ANGIOGRAM;  Surgeon: Dolores Patty, MD;  Location: [facility] CATH LAB;  Service: Cardiovascular;  Laterality: N/A;   MASTECTOMY Bilateral    MINIMALLY INVASIVE TRICUSPID VALVE REPAIR     MITRAL VALVE REPAIR  2017  MITRAL VALVE REPLACEMENT     OOPHORECTOMY     SPIGELIAN HERNIA      reports that she has never smoked. She has never used smokeless tobacco. She reports that she does not currently use alcohol. She reports that she does not use drugs. family history includes Diabetes in her son; Heart disease in her mother; Melanoma in her father; Ovarian cancer in her sister; Pancreatic cancer in her father; Uterine cancer in her mother. Allergies  Allergen Reactions   Fish Allergy Anaphylaxis and Swelling    Scaled fish, like flounder   Penicillins Anaphylaxis, Hives and Rash   Levofloxacin In D5w Nausea And Vomiting and Swelling    Causes throat swelling   Nitroglycerin Other (See Comments)    Loss of  consciousness    Pneumovax [Pneumococcal Polysaccharide Vaccine] Other (See Comments)    Rash, arm swelling   Sulfamethoxazole     Other reaction(s): GI bleeding Other reaction(s): Blood Disorder   Latex Dermatitis and Rash    If worn for more than a day, this causes blisters    Current Outpatient Medications on File Prior to Visit  Medication Sig Dispense Refill   albuterol (VENTOLIN HFA) 108 (90 Base) MCG/ACT inhaler Inhale 2 puffs into the lungs every 6 (six) hours as needed for wheezing or shortness of breath. 8 g 5   levothyroxine (SYNTHROID) 50 MCG tablet Take 1 tablet (50 mcg total) by mouth daily. TAKE 1 TABLET(50 MCG) BY MOUTH EVERY DAY Strength: 50 mcg 90 tablet 2   metoprolol succinate (TOPROL-XL) 50 MG 24 hr tablet Take 50 mg by mouth daily.     Multiple Vitamins-Minerals (ZINC PO) Take 1 capsule by mouth daily.     ondansetron (ZOFRAN) 4 MG tablet Take 1 tablet (4 mg total) by mouth every 8 (eight) hours as needed for nausea or vomiting. 30 tablet 0   ondansetron (ZOFRAN-ODT) 8 MG disintegrating tablet Take 1 tablet (8 mg total) by mouth every 8 (eight) hours as needed for nausea. 30 tablet 0   vitamin C (ASCORBIC ACID) 500 MG tablet Take 500 mg by mouth daily.      VITAMIN D PO Take 1 tablet by mouth daily.     warfarin (COUMADIN) 10 MG tablet Take 1 tablet daily or as directed by anticoagulation clinic 90 tablet 0   warfarin (COUMADIN) 5 MG tablet Take 5-10 mg by mouth daily. Take 5mg  by mouth on SAT MON WED FRI in the evening, then take 10mg  on SUN TUE THU.     zolpidem (AMBIEN) 10 MG tablet TAKE 1 TABLET BY MOUTH EVERY DAY AT BEDTIME AS NEEDED 90 tablet 1   zonisamide (ZONEGRAN) 100 MG capsule Take 4 capsules every night 360 capsule 3   No current facility-administered medications on file prior to visit.        ROS:  All others reviewed and negative.  Objective        PE:  BP 122/74 (BP Location: Left Arm, Patient Position: Sitting, Cuff Size: Normal)   Pulse 60    Temp 98.1 F (36.7 C) (Oral)   Ht 5\' 9"  (1.753 m)   SpO2 98%   BMI 30.27 kg/m                 Constitutional: Pt appears in NAD               HENT: Head: NCAT.                Right Ear:  External ear normal.                 Left Ear: External ear normal.                Eyes: . Pupils are equal, round, and reactive to light. Conjunctivae and EOM are normal               Nose: without d/c or deformity               Neck: Neck supple. Gross normal ROM               Cardiovascular: Normal rate and regular rhythm.                 Pulmonary/Chest: Effort normal and breath sounds without rales or wheezing.                Abd:  Soft, NT, ND, + BS, no organomegaly               Neurological: Pt is alert. At baseline orientation, motor grossly intact               Skin: Skin is warm. No rashes, no other new lesions, LE edema - none               Psychiatric: Pt behavior is normal without agitation   Micro: none  Cardiac tracings I have personally interpreted today:  none  Pertinent Radiological findings (summarize): none   Lab Results  Component Value Date   WBC 6.5 06/29/2022   HGB 13.3 06/29/2022   HCT 39.5 06/29/2022   PLT 271.0 06/29/2022   GLUCOSE 92 06/29/2022   CHOL 219 (H) 06/29/2022   TRIG 320.0 (H) 06/29/2022   HDL 41.00 06/29/2022   LDLDIRECT 141.0 06/29/2022   LDLCALC 73 02/11/2008   ALT 20 06/29/2022   AST 20 06/29/2022   NA 141 06/29/2022   K 3.8 06/29/2022   CL 110 06/29/2022   CREATININE 0.92 06/29/2022   BUN 15 06/29/2022   CO2 24 06/29/2022   TSH 3.82 06/29/2022   INR 2.5 (H) 04/29/2021   HGBA1C 5.3 06/29/2022   Assessment/Plan:  [name] is a 67 y.o. White or Caucasian [1] female with  has a past medical history of Abdominal pain, epigastric (10/25/2007), ADD (01/20/2009), AKI (acute kidney injury) (HCC) (04/11/2016), ALLERGIC RHINITIS (10/15/2006), ANGIOEDEMA (08/12/2008), ANXIETY (10/15/2006), ASTHMATIC BRONCHITIS, ACUTE (01/27/2008), Bowel obstruction  (HCC), Breast cancer (HCC), Cellulitis and abscess of leg, except foot (08/12/2007), CELLULITIS, FACE (12/21/2008), CHB (complete heart block) (HCC) (04/14/2016), CHEST PAIN (06/14/2007), Chronic anticoagulation (05/16/2016), Chronic sinus infection (04/12/2010), COLECTOMY, PARTIAL, WITH ANASTOMOSIS, HX OF (10/15/2006), COLONIC POLYPS, HX OF (10/15/2006), CYST, OVARIAN NEC/NOS (10/15/2006), Dengue (02/22/2010), DVT, HX OF (10/15/2006), DYSAUTONOMIA (10/15/2006), Dysfunction of eustachian tube (05/26/2009), GI BLEEDING (04/04/2007), Glossitis (03/06/2008), HEADACHE, CHRONIC (04/04/2007), Heart murmur, Hematemesis (10/25/2007), HEMORRHOIDS (04/04/2007), HYPERLIPIDEMIA (12/12/2007), HYPERTENSION (10/15/2006), Hypertrophic cardiomyopathy (HCC), HYPOTHYROIDISM (03/06/2008), IBS (04/04/2007), MASTECTOMY, BILATERAL, HX OF (08/12/2009), NECK MASS (07/01/2007), NEOP, MALIGNANT, FEMALE BREAST NOS (10/15/2006), NEOP, MALIGNANT, THYMUS (10/15/2006), Ovarian cancer (HCC), Presence of cardiac pacemaker (04/25/2016), Red blood cell antibody positive, Seizures (HCC), Subdural hematoma (HCC), SYNCOPE (07/01/2007), Wheezing (08/20/2008), and WOUND, OPEN, LEG, WITHOUT COMPLICATION (08/16/2007).  Acute bacterial middle ear infection, bilateral Mild to mod, for antibx course ceftin 250 bid,,  to f/u any worsening symptoms or concerns  Vitamin D deficiency Last vitamin D Lab Results  Component Value Date   VD25OH 24.44 (L) 06/29/2022   Low  to start oral replacement   Dysfunction of eustachian tube Ok for mucinex bid prn  Hypothyroidism Lab Results  Component Value Date   TSH 3.82 06/29/2022   Stable, pt to continue levothyroxine 50 mcg qd  HLD (hyperlipidemia) Lab Results  Component Value Date   LDLCALC 73 02/11/2008   Stable, pt for lower chol diet   Essential hypertension BP Readings from Last 3 Encounters:  06/29/22 122/74  09/30/21 (!) 142/86  06/22/21 120/78   Stable, pt to continue medical treatment toprol xl 50  qd  Followup: Return in about 6 months (around 12/29/2022).  Oliver Barre, MD 07/01/2022 9:16 PM [facility] [place] [facility] - [facility] Internal Medicine

## 2022-06-30 ENCOUNTER — Telehealth: Payer: Self-pay | Admitting: Radiology

## 2022-06-30 NOTE — Telephone Encounter (Signed)
Left voice mail for patient to call back at [phone] to schedule Medicare Annual Wellness Visit    Last AWV:  04/05/2021   Please schedule Sequential/Initial AWV with [facility] K. CMA

## 2022-07-01 ENCOUNTER — Encounter: Payer: Self-pay | Admitting: Internal Medicine

## 2022-07-01 NOTE — Assessment & Plan Note (Signed)
Lab Results  Component Value Date   TSH 3.82 06/29/2022   Stable, pt to continue levothyroxine 50 mcg qd

## 2022-07-01 NOTE — Assessment & Plan Note (Signed)
Last vitamin D Lab Results  Component Value Date   VD25OH 24.44 (L) 06/29/2022   Low to start oral replacement

## 2022-07-01 NOTE — Assessment & Plan Note (Signed)
Ok for mucinex bid prn 

## 2022-07-01 NOTE — Assessment & Plan Note (Signed)
Lab Results  Component Value Date   LDLCALC 73 02/11/2008   Stable, pt for lower chol diet

## 2022-07-01 NOTE — Assessment & Plan Note (Signed)
Mild to mod, for antibx course ceftin 250 bid,,  to f/u any worsening symptoms or concerns

## 2022-07-01 NOTE — Assessment & Plan Note (Signed)
BP Readings from Last 3 Encounters:  06/29/22 122/74  09/30/21 (!) 142/86  06/22/21 120/78   Stable, pt to continue medical treatment toprol xl 50 qd

## 2022-07-10 ENCOUNTER — Ambulatory Visit: Payer: Medicare Other | Admitting: Neurology

## 2022-07-10 DIAGNOSIS — Z029 Encounter for administrative examinations, unspecified: Secondary | ICD-10-CM

## 2022-07-11 ENCOUNTER — Encounter: Payer: Self-pay | Admitting: Neurology

## 2022-07-17 DIAGNOSIS — I422 Other hypertrophic cardiomyopathy: Secondary | ICD-10-CM | POA: Diagnosis not present

## 2022-07-17 DIAGNOSIS — Z954 Presence of other heart-valve replacement: Secondary | ICD-10-CM | POA: Diagnosis not present

## 2022-07-17 DIAGNOSIS — I480 Paroxysmal atrial fibrillation: Secondary | ICD-10-CM | POA: Diagnosis not present

## 2022-07-17 DIAGNOSIS — I10 Essential (primary) hypertension: Secondary | ICD-10-CM | POA: Diagnosis not present

## 2022-07-27 DIAGNOSIS — I480 Paroxysmal atrial fibrillation: Secondary | ICD-10-CM | POA: Diagnosis not present

## 2022-07-27 DIAGNOSIS — I422 Other hypertrophic cardiomyopathy: Secondary | ICD-10-CM | POA: Diagnosis not present

## 2022-08-01 ENCOUNTER — Telehealth: Payer: Self-pay | Admitting: Internal Medicine

## 2022-08-01 ENCOUNTER — Other Ambulatory Visit: Payer: Self-pay

## 2022-08-01 MED ORDER — ACETAMINOPHEN-CODEINE 300-30 MG PO TABS: 1.0000 | ORAL_TABLET | Freq: Four times a day (QID) | ORAL | 0 refills | Status: DC | PRN

## 2022-08-01 MED ORDER — LEVOTHYROXINE SODIUM 50 MCG PO TABS: 50.0000 ug | ORAL_TABLET | Freq: Every day | ORAL | 3 refills | Status: DC

## 2022-08-01 MED ORDER — LEVOTHYROXINE SODIUM 50 MCG PO TABS: 50.0000 ug | ORAL_TABLET | Freq: Every day | ORAL | 2 refills | Status: DC

## 2022-08-01 NOTE — Telephone Encounter (Signed)
Done erx 

## 2022-08-01 NOTE — Telephone Encounter (Signed)
Prescription Request  08/01/2022  LOV: 06/29/2022  What is the name of the medication or equipment? Levothyroxine and tylenol #3  Have you contacted your pharmacy to request a refill? Yes   Which pharmacy would you like this sent to?  [facility] [address] [place], [place] - [address] AT [facility] [address] [place] [address] Phone: [phone] Fax: [phone]     Patient notified that their request is being sent to the clinical staff for review and that they should receive a response within 2 business days.   Please advise at Mobile [phone] (mobile)

## 2022-08-31 ENCOUNTER — Ambulatory Visit: Payer: Medicare Other | Admitting: Internal Medicine

## 2022-08-31 ENCOUNTER — Encounter: Payer: Self-pay | Admitting: Internal Medicine

## 2022-08-31 ENCOUNTER — Ambulatory Visit: Payer: Medicare Other | Admitting: Family Medicine

## 2022-08-31 VITALS — BP 122/80 | HR 63 | Temp 97.8°F | Ht 69.0 in

## 2022-08-31 DIAGNOSIS — R197 Diarrhea, unspecified: Secondary | ICD-10-CM

## 2022-08-31 DIAGNOSIS — E559 Vitamin D deficiency, unspecified: Secondary | ICD-10-CM

## 2022-08-31 DIAGNOSIS — H60512 Acute actinic otitis externa, left ear: Secondary | ICD-10-CM | POA: Diagnosis not present

## 2022-08-31 DIAGNOSIS — I10 Essential (primary) hypertension: Secondary | ICD-10-CM | POA: Diagnosis not present

## 2022-08-31 DIAGNOSIS — R0609 Other forms of dyspnea: Secondary | ICD-10-CM | POA: Diagnosis not present

## 2022-08-31 DIAGNOSIS — H608X2 Other otitis externa, left ear: Secondary | ICD-10-CM | POA: Insufficient documentation

## 2022-08-31 MED ORDER — ACETAMINOPHEN-CODEINE 300-30 MG PO TABS: 1.0000 | ORAL_TABLET | Freq: Four times a day (QID) | ORAL | 0 refills | Status: DC | PRN

## 2022-08-31 MED ORDER — CEFDINIR 300 MG PO CAPS: 300.0000 mg | ORAL_CAPSULE | Freq: Two times a day (BID) | ORAL | 0 refills | Status: DC

## 2022-08-31 NOTE — Progress Notes (Signed)
Patient ID: [name], female   DOB: Jul 16, 1955, 67 y.o.   MRN: [id number]        Chief Complaint: follow up left ear pain, chronic diarrhea, ? Of low oxygen yesterday with housework, htn       HPI:  [name] is a 67 y.o. female here with hx hypertrophic CM, PAF, s/p MVR metallic valve, CHF here after seen per [facility] [phone] with recommendation for 14 day holter, for some reason of which she is unaware.  Still has unexplained spells weakness.  Yesterday was vacuuming at home, felt weak and doe with this, with reported o2 sat low 80's.  Better later with rest.  Also with 3 days onset left ear pain and swelling, no falls , fever or drainage.         Wt Readings from Last 3 Encounters:  12/28/21 205 lb (93 kg)  04/25/21 199 lb 15.3 oz (90.7 kg)  02/03/21 200 lb (90.7 kg)   BP Readings from Last 3 Encounters:  08/31/22 122/80  06/29/22 122/74  09/30/21 (!) 142/86         Past Medical History:  Diagnosis Date   Abdominal pain, epigastric 10/25/2007   ADD 01/20/2009   AKI (acute kidney injury) (HCC) 04/11/2016   ALLERGIC RHINITIS 10/15/2006   ANGIOEDEMA 08/12/2008   ANXIETY 10/15/2006   ASTHMATIC BRONCHITIS, ACUTE 01/27/2008   Bowel obstruction (HCC)    Breast cancer (HCC)    Cellulitis and abscess of leg, except foot 08/12/2007   CELLULITIS, FACE 12/21/2008   CHB (complete heart block) (HCC) 04/14/2016   CHEST PAIN 06/14/2007   Chronic anticoagulation 05/16/2016   Chronic sinus infection 04/12/2010   COLECTOMY, PARTIAL, WITH ANASTOMOSIS, HX OF 10/15/2006   COLONIC POLYPS, HX OF 10/15/2006   CYST, OVARIAN NEC/NOS 10/15/2006   Dengue 02/22/2010   DVT, HX OF 10/15/2006   DYSAUTONOMIA 10/15/2006   Dysfunction of eustachian tube 05/26/2009   GI BLEEDING 04/04/2007   Glossitis 03/06/2008   HEADACHE, CHRONIC 04/04/2007   Heart murmur    Hematemesis 10/25/2007   HEMORRHOIDS 04/04/2007   HYPERLIPIDEMIA 12/12/2007   HYPERTENSION 10/15/2006   Hypertrophic cardiomyopathy (HCC)     HYPOTHYROIDISM 03/06/2008   IBS 04/04/2007   MASTECTOMY, BILATERAL, HX OF 08/12/2009   NECK MASS 07/01/2007   NEOP, MALIGNANT, FEMALE BREAST NOS 10/15/2006   NEOP, MALIGNANT, 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   Ovarian cancer (HCC)    Presence of cardiac pacemaker 04/25/2016   Red blood cell antibody positive    K   Seizures (HCC)    Subdural hematoma (HCC)    SYNCOPE 07/01/2007   Wheezing 08/20/2008   WOUND, OPEN, LEG, WITHOUT COMPLICATION 08/16/2007   Past Surgical History:  Procedure Laterality Date   ABDOMINAL HYSTERECTOMY     APPENDECTOMY     BREAST RECONSTRUCTION     CARDIAC ELECTROPHYSIOLOGY MAPPING AND ABLATION     CESAREAN SECTION     CHOLECYSTECTOMY     COLON RESECTION  2005   COLON SURGERY  06/2020   COLONOSCOPY W/ BIOPSIES     FLEXIBLE SIGMOIDOSCOPY     LEFT AND RIGHT HEART CATHETERIZATION WITH CORONARY ANGIOGRAM N/A 10/07/2012   Procedure: LEFT AND RIGHT HEART CATHETERIZATION WITH CORONARY ANGIOGRAM;  Surgeon: Dolores Patty, MD;  Location: [facility] CATH LAB;  Service: Cardiovascular;  Laterality: N/A;   MASTECTOMY Bilateral    MINIMALLY INVASIVE TRICUSPID VALVE REPAIR     MITRAL VALVE REPAIR  2017   MITRAL VALVE REPLACEMENT     OOPHORECTOMY  SPIGELIAN HERNIA      reports that she has never smoked. She has never used smokeless tobacco. She reports that she does not currently use alcohol. She reports that she does not use drugs. family history includes Diabetes in her son; Heart disease in her mother; Melanoma in her father; Ovarian cancer in her sister; Pancreatic cancer in her father; Uterine cancer in her mother. Allergies  Allergen Reactions   Fish Allergy Anaphylaxis and Swelling    Scaled fish, like flounder   Penicillins Anaphylaxis, Hives and Rash   Levofloxacin In D5w Nausea And Vomiting and Swelling    Causes throat swelling   Nitroglycerin Other (See Comments)    Loss of consciousness    Pneumovax [Pneumococcal Polysaccharide Vaccine] Other (See Comments)    Rash, arm  swelling   Sulfamethoxazole     Other reaction(s): GI bleeding Other reaction(s): Blood Disorder   Latex Dermatitis and Rash    If worn for more than a day, this causes blisters    Current Outpatient Medications on File Prior to Visit  Medication Sig Dispense Refill   albuterol (VENTOLIN HFA) 108 (90 Base) MCG/ACT inhaler Inhale 2 puffs into the lungs every 6 (six) hours as needed for wheezing or shortness of breath. 8 g 5   levothyroxine (SYNTHROID) 50 MCG tablet Take 1 tablet (50 mcg total) by mouth daily. TAKE 1 TABLET(50 MCG) BY MOUTH EVERY DAY Strength: 50 mcg 90 tablet 3   metoprolol succinate (TOPROL-XL) 50 MG 24 hr tablet Take 50 mg by mouth daily.     Multiple Vitamins-Minerals (ZINC PO) Take 1 capsule by mouth daily.     ondansetron (ZOFRAN) 4 MG tablet Take 1 tablet (4 mg total) by mouth every 8 (eight) hours as needed for nausea or vomiting. 30 tablet 0   ondansetron (ZOFRAN-ODT) 8 MG disintegrating tablet Take 1 tablet (8 mg total) by mouth every 8 (eight) hours as needed for nausea. 30 tablet 0   venlafaxine XR (EFFEXOR-XR) 150 MG 24 hr capsule Take 1 capsule (150 mg total) by mouth daily. 90 capsule 3   vitamin C (ASCORBIC ACID) 500 MG tablet Take 500 mg by mouth daily.      VITAMIN D PO Take 1 tablet by mouth daily.     warfarin (COUMADIN) 10 MG tablet Take 1 tablet daily or as directed by anticoagulation clinic 90 tablet 0   warfarin (COUMADIN) 5 MG tablet Take 5-10 mg by mouth daily. Take 5mg  by mouth on SAT MON WED FRI in the evening, then take 10mg  on SUN TUE THU.     zolpidem (AMBIEN) 10 MG tablet TAKE 1 TABLET BY MOUTH EVERY DAY AT BEDTIME AS NEEDED 90 tablet 1   zonisamide (ZONEGRAN) 100 MG capsule Take 4 capsules every night 360 capsule 3   No current facility-administered medications on file prior to visit.        ROS:  All others reviewed and negative.  Objective        PE:  BP 122/80 (BP Location: Right Arm, Patient Position: Sitting, Cuff Size: Normal)    Pulse 63   Temp 97.8 F (36.6 C) (Oral)   Ht 5\' 9"  (1.753 m)   SpO2 99%   BMI 30.27 kg/m                 Ambulatory o2 sat - 92% at 100 yds               Constitutional: Pt appears in NAD  HENT: Head: NCAT.                Right Ear: External ear normal.                 Left Ear: External ear normal. Left ext canal with 1-2+ red tender swelling              Eyes: . Pupils are equal, round, and reactive to light. Conjunctivae and EOM are normal               Nose: without d/c or deformity               Neck: Neck supple. Gross normal ROM               Cardiovascular: Normal rate and regular rhythm.                 Pulmonary/Chest: Effort normal and breath sounds without rales or wheezing.                Abd:  Soft, NT, ND, + BS, no organomegaly               Neurological: Pt is alert. At baseline orientation, motor grossly intact               Skin: Skin is warm. No rashes, no other new lesions, LE edema - non               Psychiatric: Pt behavior is normal without agitation   Micro: none  Cardiac tracings I have personally interpreted today:  none  Pertinent Radiological findings (summarize): none   Lab Results  Component Value Date   WBC 6.5 06/29/2022   HGB 13.3 06/29/2022   HCT 39.5 06/29/2022   PLT 271.0 06/29/2022   GLUCOSE 92 06/29/2022   CHOL 219 (H) 06/29/2022   TRIG 320.0 (H) 06/29/2022   HDL 41.00 06/29/2022   LDLDIRECT 141.0 06/29/2022   LDLCALC 73 02/11/2008   ALT 20 06/29/2022   AST 20 06/29/2022   NA 141 06/29/2022   K 3.8 06/29/2022   CL 110 06/29/2022   CREATININE 0.92 06/29/2022   BUN 15 06/29/2022   CO2 24 06/29/2022   TSH 3.82 06/29/2022   INR 2.5 (H) 04/29/2021   HGBA1C 5.3 06/29/2022   Assessment/Plan:  [name] is a 67 y.o. White or Caucasian [1] female with  has a past medical history of Abdominal pain, epigastric (10/25/2007), ADD (01/20/2009), AKI (acute kidney injury) (HCC) (04/11/2016), ALLERGIC RHINITIS (10/15/2006),  ANGIOEDEMA (08/12/2008), ANXIETY (10/15/2006), ASTHMATIC BRONCHITIS, ACUTE (01/27/2008), Bowel obstruction (HCC), Breast cancer (HCC), Cellulitis and abscess of leg, except foot (08/12/2007), CELLULITIS, FACE (12/21/2008), CHB (complete heart block) (HCC) (04/14/2016), CHEST PAIN (06/14/2007), Chronic anticoagulation (05/16/2016), Chronic sinus infection (04/12/2010), COLECTOMY, PARTIAL, WITH ANASTOMOSIS, HX OF (10/15/2006), COLONIC POLYPS, HX OF (10/15/2006), CYST, OVARIAN NEC/NOS (10/15/2006), Dengue (02/22/2010), DVT, HX OF (10/15/2006), DYSAUTONOMIA (10/15/2006), Dysfunction of eustachian tube (05/26/2009), GI BLEEDING (04/04/2007), Glossitis (03/06/2008), HEADACHE, CHRONIC (04/04/2007), Heart murmur, Hematemesis (10/25/2007), HEMORRHOIDS (04/04/2007), HYPERLIPIDEMIA (12/12/2007), HYPERTENSION (10/15/2006), Hypertrophic cardiomyopathy (HCC), HYPOTHYROIDISM (03/06/2008), IBS (04/04/2007), MASTECTOMY, BILATERAL, HX OF (08/12/2009), NECK MASS (07/01/2007), NEOP, MALIGNANT, FEMALE BREAST NOS (10/15/2006), NEOP, MALIGNANT, THYMUS (10/15/2006), Ovarian cancer (HCC), Presence of cardiac pacemaker (04/25/2016), Red blood cell antibody positive, Seizures (HCC), Subdural hematoma (HCC), SYNCOPE (07/01/2007), Wheezing (08/20/2008), and WOUND, OPEN, LEG, WITHOUT COMPLICATION (08/16/2007).  Actinic otitis externa of left ear Mild to mod, for antibx course - omnicef 300 bid,  to f/u  any worsening symptoms or concerns  Essential hypertension BP Readings from Last 3 Encounters:  08/31/22 122/80  06/29/22 122/74  09/30/21 (!) 142/86   Stable, pt to continue medical treatment toprol xl 50 qd   Diarrhea Chronic persistnet, for tylenol #3 prn refill  Dyspnea on exertion With self reported low o2 sats unable to reproduce today, cont to follow with home o2 sats  Vitamin D deficiency Last vitamin D Lab Results  Component Value Date   VD25OH 24.44 (L) 06/29/2022   Low, to start oral replacement   Followup: No follow-ups on  file.  Oliver Barre, MD 09/02/2022 7:56 PM [facility] [place] [facility] - [facility] Internal Medicine

## 2022-08-31 NOTE — Patient Instructions (Addendum)
Your lowest oxygen saturation here was 91%, so we can hold on the xray and oxygen for now  Please consider a "ring oximeter" (such as at [facility]) that works with your cell phone to record the oxygen levels during the day and night to further evaluate  Please take all new medication as prescribed -the omnicef  Please continue all other medications as before, and refills have been done if requested - T#3  Please have the pharmacy call with any other refills you may need.  Please continue your efforts at being more active, low cholesterol diet, and weight control  Please keep your appointments with your specialists as you may have planned - the 14 day holter monitor that was intended for you after your last Cardiology appt July 25  No further lab work needed today  Please make an Appointment to return in 6 months, or sooner if needed

## 2022-09-02 NOTE — Assessment & Plan Note (Signed)
With self reported low o2 sats unable to reproduce today, cont to follow with home o2 sats

## 2022-09-02 NOTE — Assessment & Plan Note (Signed)
Mild to mod, for antibx course - omnicef 300 bid,  to f/u any worsening symptoms or concerns

## 2022-09-02 NOTE — Assessment & Plan Note (Signed)
Last vitamin D Lab Results  Component Value Date   VD25OH 24.44 (L) 06/29/2022   Low, to start oral replacement

## 2022-09-02 NOTE — Assessment & Plan Note (Signed)
BP Readings from Last 3 Encounters:  08/31/22 122/80  06/29/22 122/74  09/30/21 (!) 142/86   Stable, pt to continue medical treatment toprol xl 50 qd

## 2022-09-02 NOTE — Assessment & Plan Note (Signed)
Chronic persistnet, for tylenol #3 prn refill

## 2022-09-05 ENCOUNTER — Telehealth: Payer: Self-pay | Admitting: Internal Medicine

## 2022-09-05 MED ORDER — CEFUROXIME AXETIL 250 MG PO TABS: 250.0000 mg | ORAL_TABLET | Freq: Two times a day (BID) | ORAL | 0 refills | Status: AC

## 2022-09-05 NOTE — Telephone Encounter (Signed)
Ok this is done 

## 2022-09-05 NOTE — Telephone Encounter (Signed)
Patient called states she feels prescription of cefdinir (OMNICEF) 300 MG capsule , is not working. States she is still running a fever and both ears still hurt and the glands under her ears are swollen. She asked if she can be prescribed something else, states she has taken ceftin before and it has worked. Can be sent to the [facility] pharmacy [phone].

## 2022-09-06 NOTE — Telephone Encounter (Signed)
Called and let Pt know

## 2022-09-25 ENCOUNTER — Encounter: Payer: Self-pay | Admitting: Internal Medicine

## 2022-09-25 MED ORDER — CEFUROXIME AXETIL 250 MG PO TABS: 250.0000 mg | ORAL_TABLET | Freq: Two times a day (BID) | ORAL | 0 refills | Status: AC

## 2022-09-27 ENCOUNTER — Other Ambulatory Visit: Payer: Self-pay | Admitting: Neurology

## 2022-09-27 DIAGNOSIS — R4689 Other symptoms and signs involving appearance and behavior: Secondary | ICD-10-CM

## 2022-09-27 DIAGNOSIS — R55 Syncope and collapse: Secondary | ICD-10-CM

## 2022-09-27 DIAGNOSIS — R062 Wheezing: Secondary | ICD-10-CM | POA: Diagnosis not present

## 2022-10-03 ENCOUNTER — Telehealth: Payer: Self-pay | Admitting: Internal Medicine

## 2022-10-03 MED ORDER — ACETAMINOPHEN-CODEINE 300-30 MG PO TABS: 1.0000 | ORAL_TABLET | Freq: Four times a day (QID) | ORAL | 0 refills | Status: DC | PRN

## 2022-10-03 NOTE — Telephone Encounter (Signed)
Done erx 

## 2022-10-03 NOTE — Telephone Encounter (Signed)
Prescription Request  10/03/2022  LOV: 08/31/2022  What is the name of the medication or equipment? acetaminophen-codeine (TYLENOL/CODEINE #3) 300-30 MG tablet   Have you contacted your pharmacy to request a refill? No   Which pharmacy would you like this sent to?  [facility] [address] [place], [place] - [address] AT [facility] [address] [place] [address] Phone: [phone] Fax: [phone]    Patient notified that their request is being sent to the clinical staff for review and that they should receive a response within 2 business days.   Please advise at Mobile [phone] (mobile)

## 2022-10-31 ENCOUNTER — Encounter: Payer: Self-pay | Admitting: Internal Medicine

## 2022-10-31 ENCOUNTER — Ambulatory Visit: Payer: Medicare Other | Admitting: Internal Medicine

## 2022-10-31 VITALS — BP 134/90 | HR 60 | Temp 97.9°F | Ht 69.0 in

## 2022-10-31 DIAGNOSIS — T7840XA Allergy, unspecified, initial encounter: Secondary | ICD-10-CM

## 2022-10-31 DIAGNOSIS — T783XXA Angioneurotic edema, initial encounter: Secondary | ICD-10-CM

## 2022-10-31 DIAGNOSIS — H9202 Otalgia, left ear: Secondary | ICD-10-CM | POA: Diagnosis not present

## 2022-10-31 DIAGNOSIS — I10 Essential (primary) hypertension: Secondary | ICD-10-CM

## 2022-10-31 DIAGNOSIS — E559 Vitamin D deficiency, unspecified: Secondary | ICD-10-CM

## 2022-10-31 MED ORDER — EPINEPHRINE 0.3 MG/0.3ML IJ SOAJ: 0.3000 mg | INTRAMUSCULAR | 2 refills | Status: DC | PRN

## 2022-10-31 MED ORDER — ALBUTEROL SULFATE HFA 108 (90 BASE) MCG/ACT IN AERS: 2.0000 | INHALATION_SPRAY | Freq: Four times a day (QID) | RESPIRATORY_TRACT | 11 refills | Status: DC | PRN

## 2022-10-31 MED ORDER — METHYLPREDNISOLONE ACETATE 80 MG/ML IJ SUSP
80.0000 mg | Freq: Once | INTRAMUSCULAR | Status: AC
Start: 2022-10-31 — End: 2022-10-31
  Administered 2022-10-31: 80 mg via INTRAMUSCULAR

## 2022-10-31 MED ORDER — PREDNISONE 10 MG PO TABS: ORAL_TABLET | ORAL | 0 refills | Status: DC

## 2022-10-31 MED ORDER — CEFUROXIME AXETIL 250 MG PO TABS: 250.0000 mg | ORAL_TABLET | Freq: Two times a day (BID) | ORAL | 0 refills | Status: AC

## 2022-10-31 NOTE — Patient Instructions (Addendum)
You had the steroid shot today  Please take all new medication as prescribed  - the prednisone, albuterol inhaler, and epipen, and Ceftin   You can still take Benadryl 25 - 50mg  every 6 hrs as needed for wheezing, rash or other unusual swelling  Please continue all other medications as before, and refills have been done if requested.  Please have the pharmacy call with any other refills you may need.  Please keep your appointments with your specialists as you may have planned  Please make an Appointment to return in 4 months, or sooner if needed

## 2022-11-02 ENCOUNTER — Other Ambulatory Visit: Payer: Self-pay | Admitting: Internal Medicine

## 2022-11-02 ENCOUNTER — Telehealth: Payer: Self-pay | Admitting: Internal Medicine

## 2022-11-02 NOTE — Telephone Encounter (Signed)
Prescription Request  11/02/2022  LOV: 10/31/2022  What is the name of the medication or equipment? acetaminophen-codeine (TYLENOL/CODEINE #3) 300-30 MG tablet   Have you contacted your pharmacy to request a refill? Yes   Which pharmacy would you like this sent to?  [facility] [address] [place], [place] - [address] AT [facility] [address] [place] [address] Phone: [phone] Fax: [phone]    Patient notified that their request is being sent to the clinical staff for review and that they should receive a response within 2 business days.   Please advise at Mobile [phone] (mobile)

## 2022-11-03 ENCOUNTER — Encounter: Payer: Self-pay | Admitting: Radiology

## 2022-11-03 MED ORDER — ACETAMINOPHEN-CODEINE 300-30 MG PO TABS: 1.0000 | ORAL_TABLET | Freq: Four times a day (QID) | ORAL | 0 refills | Status: DC | PRN

## 2022-11-03 NOTE — Telephone Encounter (Signed)
Prescription sent to pharmacy.

## 2022-11-03 NOTE — Telephone Encounter (Signed)
Patient was supposed to have gotten this sent when she was in here this week for her appointment but it did not get sent - please get another physician to send this as she is completely out of her medication.

## 2022-11-04 ENCOUNTER — Encounter: Payer: Self-pay | Admitting: Internal Medicine

## 2022-11-04 DIAGNOSIS — S065X9A Traumatic subdural hemorrhage with loss of consciousness of unspecified duration, initial encounter: Secondary | ICD-10-CM | POA: Insufficient documentation

## 2022-11-04 DIAGNOSIS — T783XXA Angioneurotic edema, initial encounter: Secondary | ICD-10-CM | POA: Insufficient documentation

## 2022-11-04 NOTE — Assessment & Plan Note (Signed)
Last vitamin D Lab Results  Component Value Date   VD25OH 24.44 (L) 06/29/2022   Low, to start oral replacement

## 2022-11-04 NOTE — Assessment & Plan Note (Signed)
BP Readings from Last 3 Encounters:  10/31/22 (!) 134/90  08/31/22 122/80  06/29/22 122/74   Uncontrolled, likely reactive, pt to continue medical treatment toprol xl 50 qd

## 2022-11-04 NOTE — Assessment & Plan Note (Signed)
With improved but persistent swelling, mild sob - for  prednisone taper, albuterol inhaler, epipen, benadryl prn,  to f/u any worsening symptoms or concerns

## 2022-11-04 NOTE — Assessment & Plan Note (Signed)
Mild to mod, can't r/o otitis media, for antibx course ceftin 250 bid,  to f/u any worsening symptoms or concerns

## 2022-11-06 ENCOUNTER — Ambulatory Visit (INDEPENDENT_AMBULATORY_CARE_PROVIDER_SITE_OTHER): Payer: Medicare Other

## 2022-11-06 VITALS — Ht 69.0 in | Wt 205.0 lb

## 2022-11-06 DIAGNOSIS — Z0000 Encounter for general adult medical examination without abnormal findings: Secondary | ICD-10-CM | POA: Diagnosis not present

## 2022-11-06 NOTE — Progress Notes (Signed)
Subjective:   [name] is a 67 y.o. female who presents for Medicare Annual (Subsequent) preventive examination.  Visit Complete: Virtual I connected with  [name] on 11/06/22 by a audio enabled telemedicine application and verified that I am speaking with the correct person using two identifiers.  Patient Location: Home  Provider Location: Home Office  I discussed the limitations of evaluation and management by telemedicine. The patient expressed understanding and agreed to proceed.  Vital Signs: Because this visit was a virtual/telehealth visit, some criteria may be missing or patient reported. Any vitals not documented were not able to be obtained and vitals that have been documented are patient reported.   Cardiac Risk Factors include: advanced age (>9men, >58 women);dyslipidemia;hypertension;Other (see comment), Risk factor comments: CHB, CHF, A-fib,     Objective:    Today's Vitals   11/06/22 1109  Weight: 205 lb (93 kg)  Height: 5\' 9"  (1.753 m)   Body mass index is 30.27 kg/m.     11/06/2022   11:20 AM 12/28/2021   10:21 AM 04/25/2021    5:17 PM 04/05/2021    1:53 PM 02/03/2021   12:55 PM 11/02/2020   10:43 AM 07/04/2018    3:44 PM  Advanced Directives  Does Patient Have a Medical Advance Directive? No No No No No No No  Would patient like information on creating a medical advance directive?   No - Patient declined No - Patient declined   No - Patient declined    Current Medications (verified) Outpatient Encounter Medications as of 11/06/2022  Medication Sig   acetaminophen-codeine (TYLENOL/CODEINE #3) 300-30 MG tablet Take 1 tablet by mouth every 6 (six) hours as needed.   albuterol (VENTOLIN HFA) 108 (90 Base) MCG/ACT inhaler Inhale 2 puffs into the lungs every 6 (six) hours as needed for wheezing or shortness of breath.   cefUROXime (CEFTIN) 250 MG tablet Take 1 tablet (250 mg total) by mouth 2 (two) times daily with a meal for 10 days.   EPINEPHrine  (EPIPEN 2-PAK) 0.3 mg/0.3 mL IJ SOAJ injection Inject 0.3 mg into the muscle as needed for anaphylaxis.   levothyroxine (SYNTHROID) 50 MCG tablet Take 1 tablet (50 mcg total) by mouth daily. TAKE 1 TABLET(50 MCG) BY MOUTH EVERY DAY Strength: 50 mcg   metoprolol succinate (TOPROL-XL) 50 MG 24 hr tablet Take 50 mg by mouth daily.   Multiple Vitamins-Minerals (ZINC PO) Take 1 capsule by mouth daily.   ondansetron (ZOFRAN) 4 MG tablet Take 1 tablet (4 mg total) by mouth every 8 (eight) hours as needed for nausea or vomiting.   ondansetron (ZOFRAN-ODT) 8 MG disintegrating tablet Take 1 tablet (8 mg total) by mouth every 8 (eight) hours as needed for nausea.   predniSONE (DELTASONE) 10 MG tablet 3 tabs by mouth per day for 3 days,2tabs per day for 3 days,1tab per day for 3 days   venlafaxine XR (EFFEXOR-XR) 150 MG 24 hr capsule Take 1 capsule (150 mg total) by mouth daily.   vitamin C (ASCORBIC ACID) 500 MG tablet Take 500 mg by mouth daily.    VITAMIN D PO Take 1 tablet by mouth daily.   warfarin (COUMADIN) 10 MG tablet Take 1 tablet daily or as directed by anticoagulation clinic   warfarin (COUMADIN) 5 MG tablet Take 5-10 mg by mouth daily. Take 5mg  by mouth on SAT MON WED FRI in the evening, then take 10mg  on SUN TUE THU.   zolpidem (AMBIEN) 10 MG tablet TAKE 1  TABLET BY MOUTH EVERY DAY AT BEDTIME AS NEEDED   zonisamide (ZONEGRAN) 100 MG capsule TAKE 4 CAPSULES BY MOUTH EVERY NIGHT   No facility-administered encounter medications on file as of 11/06/2022.    Allergies (verified) Fish allergy, Penicillins, Levofloxacin in d5w, Nitroglycerin, Pneumovax [pneumococcal polysaccharide vaccine], Sulfamethoxazole, and Latex   History: Past Medical History:  Diagnosis Date   Abdominal pain, epigastric 10/25/2007   ADD 01/20/2009   AKI (acute kidney injury) (HCC) 04/11/2016   ALLERGIC RHINITIS 10/15/2006   ANGIOEDEMA 08/12/2008   ANXIETY 10/15/2006   ASTHMATIC BRONCHITIS, ACUTE 01/27/2008   Bowel  obstruction (HCC)    Breast cancer (HCC)    Cellulitis and abscess of leg, except foot 08/12/2007   CELLULITIS, FACE 12/21/2008   CHB (complete heart block) (HCC) 04/14/2016   CHEST PAIN 06/14/2007   Chronic anticoagulation 05/16/2016   Chronic sinus infection 04/12/2010   COLECTOMY, PARTIAL, WITH ANASTOMOSIS, HX OF 10/15/2006   COLONIC POLYPS, HX OF 10/15/2006   CYST, OVARIAN NEC/NOS 10/15/2006   Dengue 02/22/2010   DVT, HX OF 10/15/2006   DYSAUTONOMIA 10/15/2006   Dysfunction of eustachian tube 05/26/2009   GI BLEEDING 04/04/2007   Glossitis 03/06/2008   HEADACHE, CHRONIC 04/04/2007   Heart murmur    Hematemesis 10/25/2007   HEMORRHOIDS 04/04/2007   HYPERLIPIDEMIA 12/12/2007   HYPERTENSION 10/15/2006   Hypertrophic cardiomyopathy (HCC)    HYPOTHYROIDISM 03/06/2008   IBS 04/04/2007   MASTECTOMY, BILATERAL, HX OF 08/12/2009   NECK MASS 07/01/2007   NEOP, MALIGNANT, FEMALE BREAST NOS 10/15/2006   NEOP, MALIGNANT, 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   Ovarian cancer (HCC)    Presence of cardiac pacemaker 04/25/2016   Red blood cell antibody positive    K   Seizures (HCC)    Subdural hematoma (HCC)    SYNCOPE 07/01/2007   Wheezing 08/20/2008   WOUND, OPEN, LEG, WITHOUT COMPLICATION 08/16/2007   Past Surgical History:  Procedure Laterality Date   ABDOMINAL HYSTERECTOMY     APPENDECTOMY     BREAST RECONSTRUCTION     CARDIAC ELECTROPHYSIOLOGY MAPPING AND ABLATION     CESAREAN SECTION     CHOLECYSTECTOMY     COLON RESECTION  2005   COLON SURGERY  06/2020   COLONOSCOPY W/ BIOPSIES     FLEXIBLE SIGMOIDOSCOPY     LEFT AND RIGHT HEART CATHETERIZATION WITH CORONARY ANGIOGRAM N/A 10/07/2012   Procedure: LEFT AND RIGHT HEART CATHETERIZATION WITH CORONARY ANGIOGRAM;  Surgeon: Dolores Patty, MD;  Location: [facility] CATH LAB;  Service: Cardiovascular;  Laterality: N/A;   MASTECTOMY Bilateral    MINIMALLY INVASIVE TRICUSPID VALVE REPAIR     MITRAL VALVE REPAIR  2017   MITRAL VALVE REPLACEMENT     OOPHORECTOMY      SPIGELIAN HERNIA     Family History  Problem Relation Age of Onset   Heart disease Mother    Uterine cancer Mother    Ovarian cancer Sister    Pancreatic cancer Father    Melanoma Father    Diabetes Son    Social History   Socioeconomic History   Marital status: Married    Spouse name: Lalena Salas   Number of children: 4   Years of education: Not on file   Highest education level: Bachelor's degree (e.g., BA, AB, BS)  Occupational History   Occupation: retired  Tobacco Use   Smoking status: Never   Smokeless tobacco: Never  Vaping Use   Vaping status: Never Used  Substance and Sexual Activity   Alcohol use: Not Currently  Comment: social   Drug use: No   Sexual activity: Yes    Partners: Male    Birth control/protection: Post-menopausal  Other Topics Concern   Not on file  Social History Narrative   Married second marriage    Lives in the [place] area with her husband and her son   Has 7 dogs   Retired   3 sons 1 daughter   Never smoker   No alcohol   1 or 2 caffeinated beverages daily    no drug use   Right handed          Social Determinants of Health   Financial Resource Strain: Low Risk  (11/06/2022)   Overall Financial Resource Strain (CARDIA)    Difficulty of Paying Living Expenses: Not hard at all  Food Insecurity: No Food Insecurity (11/06/2022)   Hunger Vital Sign    Worried About Running Out of Food in the Last Year: Never true    Ran Out of Food in the Last Year: Never true  Transportation Needs: No Transportation Needs (11/06/2022)   PRAPARE - Administrator, Civil Service (Medical): No    Lack of Transportation (Non-Medical): No  Physical Activity: Insufficiently Active (11/06/2022)   Exercise Vital Sign    Days of Exercise per Week: 7 days    Minutes of Exercise per Session: 20 min  Stress: Stress Concern Present (11/06/2022)   [employer] of Occupational Health - Occupational Stress Questionnaire    Feeling of Stress  : Very much  Social Connections: Socially Integrated (11/06/2022)   Social Connection and Isolation Panel [NHANES]    Frequency of Communication with Friends and Family: More than three times a week    Frequency of Social Gatherings with Friends and Family: More than three times a week    Attends Religious Services: More than 4 times per year    Active Member of [employer] or Organizations: Yes    Attends Engineer, structural: More than 4 times per year    Marital Status: Married    Tobacco Counseling Counseling given: Not Answered   Clinical Intake:  Pre-visit preparation completed: Yes  Pain : No/denies pain     BMI - recorded: 30.27 Nutritional Status: BMI > 30  Obese Nutritional Risks: None Diabetes: No  How often do you need to have someone help you when you read instructions, pamphlets, or other written materials from your doctor or pharmacy?: 1 - Never  Interpreter Needed?: No  Information entered by :: Avyonna Wagoner, RMA   Activities of Daily Living    11/06/2022   11:17 AM  In your present state of health, do you have any difficulty performing the following activities:  Hearing? 0  Vision? 0  Difficulty concentrating or making decisions? 0  Walking or climbing stairs? 0  Dressing or bathing? 0  Doing errands, shopping? 0  Comment husband drives her  Preparing Food and eating ? N  Using the Toilet? N  In the past six months, have you accidently leaked urine? Y  Comment due to surgery  Do you have problems with loss of bowel control? N  Managing your Medications? N  Managing your Finances? N  Housekeeping or managing your Housekeeping? N    Patient Care Team: Corwin Levins, MD as PCP - General Karel Jarvis Lesle Chris, MD as Consulting Physician (Neurology)  Indicate any recent Medical Services you may have received from other than [facility] providers in the past year (date may be approximate).  Assessment:   This is a routine wellness examination for  Tajanae.  Hearing/Vision screen Hearing Screening - Comments:: Denies hearing difficulties   Vision Screening - Comments:: Wears contact lenses   Goals Addressed             This Visit's Progress    Patient Stated   On track    My goal is to stay physically active.      Depression Screen    11/06/2022   11:25 AM 08/31/2022   10:30 AM 06/29/2022    1:52 PM 09/30/2021   11:39 AM 06/22/2021   11:48 AM 06/22/2021   11:14 AM 04/05/2021    1:57 PM  PHQ 2/9 Scores  PHQ - 2 Score 0 0 0  1 0 0  PHQ- 9 Score 2     2   Exception Documentation    Patient refusal       Fall Risk    11/06/2022   11:20 AM 08/31/2022   10:30 AM 06/29/2022    1:52 PM 12/28/2021   10:21 AM 09/30/2021   11:39 AM  Fall Risk   Falls in the past year? 0 0 0 0 0  Number falls in past yr: 0 0 0 0   Injury with Fall? 0 0 0 0   Risk for fall due to : No Fall Risks No Fall Risks No Fall Risks  No Fall Risks  Follow up Falls prevention discussed;Falls evaluation completed Falls evaluation completed Falls evaluation completed Falls evaluation completed Follow up appointment    MEDICARE RISK AT HOME: Medicare Risk at Home Any stairs in or around the home?: Yes If so, are there any without handrails?: Yes Home free of loose throw rugs in walkways, pet beds, electrical cords, etc?: No (has 9 dogs) Adequate lighting in your home to reduce risk of falls?: Yes Life alert?: No Use of a cane, walker or w/c?: No Grab bars in the bathroom?: Yes Shower chair or bench in shower?: Yes Elevated toilet seat or a handicapped toilet?: Yes  TIMED UP AND GO:  Was the test performed?  No    Cognitive Function:        11/06/2022   11:21 AM 04/05/2021    2:04 PM  6CIT Screen  What Year? 0 points 0 points  What month? 0 points 0 points  What time? 0 points 0 points  Count back from 20 0 points 0 points  Months in reverse 0 points 0 points  Repeat phrase 0 points 0 points  Total Score 0 points 0 points     Immunizations Immunization History  Administered Date(s) Administered   H1N1 11/04/2007   HIB (PRP-OMP) 09/16/2007   Influenza Whole 11/20/2006, 11/04/2007   Influenza, Seasonal, Injecte, Preservative Fre 01/30/2012   Influenza-Unspecified 01/14/2015, 10/17/2019   Janssen (J&J) SARS-COV-2 Vaccination 07/15/2019, 07/22/2019   Pneumococcal Polysaccharide-23 02/11/2008   Td 02/11/2008, 04/21/2008    TDAP status: Due, Education has been provided regarding the importance of this vaccine. Advised may receive this vaccine at local pharmacy or Health Dept. Aware to provide a copy of the vaccination record if obtained from local pharmacy or Health Dept. Verbalized acceptance and understanding.  Flu Vaccine status: Due, Education has been provided regarding the importance of this vaccine. Advised may receive this vaccine at local pharmacy or Health Dept. Aware to provide a copy of the vaccination record if obtained from local pharmacy or Health Dept. Verbalized acceptance and understanding.  Pneumococcal vaccine status: Up to date  Covid-19 vaccine status: Information provided on how to obtain vaccines.   Qualifies for Shingles Vaccine? Yes   Zostavax completed No   Shingrix Completed?: No.    Education has been provided regarding the importance of this vaccine. Patient has been advised to call insurance company to determine out of pocket expense if they have not yet received this vaccine. Advised may also receive vaccine at local pharmacy or Health Dept. Verbalized acceptance and understanding.  Screening Tests Health Maintenance  Topic Date Due   DTaP/Tdap/Td (3 - Tdap) 01/02/2023 (Originally 04/22/2018)   INFLUENZA VACCINE  04/02/2023 (Originally 08/03/2022)   Medicare Annual Wellness (AWV)  11/06/2023   Colonoscopy  06/29/2030   DEXA SCAN  Completed   Hepatitis C Screening  Completed   HPV VACCINES  Aged Out   Pneumonia Vaccine 65+ Years old  Discontinued   MAMMOGRAM  Discontinued    COVID-19 Vaccine  Discontinued   Zoster Vaccines- Shingrix  Discontinued    Health Maintenance  There are no preventive care reminders to display for this patient.   Colorectal cancer screening: No longer required.   Mammogram status: No longer required due to breast cancer.  Bone Density status: Completed 08/31/2021. Results reflect: Bone density results: OSTEOPENIA. Repeat every 2 years.  Lung Cancer Screening: (Low Dose CT Chest recommended if Age 49-80 years, 20 pack-year currently smoking OR have quit w/in 15years.) does not qualify.   Lung Cancer Screening Referral: N/A  Additional Screening:  Hepatitis C Screening: does qualify; Completed 04/10/2016  Vision Screening: Recommended annual ophthalmology exams for early detection of glaucoma and other disorders of the eye. Is the patient up to date with their annual eye exam?  No  Who is the provider or what is the name of the office in which the patient attends annual eye exams? Dr. Hyacinth Meeker If pt is not established with a provider, would they like to be referred to a provider to establish care? No .   Dental Screening: Recommended annual dental exams for proper oral hygiene   Community Resource Referral / Chronic Care Management: CRR required this visit?  No   CCM required this visit?  No     Plan:     I have personally reviewed and noted the following in the patient's chart:   Medical and social history Use of alcohol, tobacco or illicit drugs  Current medications and supplements including opioid prescriptions. Patient is not currently taking opioid prescriptions. Functional ability and status Nutritional status Physical activity Advanced directives List of other physicians Hospitalizations, surgeries, and ER visits in previous 12 months Vitals Screenings to include cognitive, depression, and falls Referrals and appointments  In addition, I have reviewed and discussed with patient certain preventive  protocols, quality metrics, and best practice recommendations. A written personalized care plan for preventive services as well as general preventive health recommendations were provided to patient.     Mairyn Lenahan L Muntaha Vermette, CMA   11/06/2022   After Visit Summary: (MyChart) Due to this being a telephonic visit, the after visit summary with patients personalized plan was offered to patient via MyChart   Nurse Notes: Patient is due for a Flu and Tetanus vaccines.  She stated that she will get these done soon.  Patient is up to date on all other health maintenance.  She had no other concerns to address today.

## 2022-11-06 NOTE — Patient Instructions (Signed)
Ms. Pizzini , Thank you for taking time to come for your Medicare Wellness Visit. I appreciate your ongoing commitment to your health goals. Please review the following plan we discussed and let me know if I can assist you in the future.   Referrals/Orders/Follow-Ups/Clinician Recommendations: You are due for a Flu vaccine and a Tetanus vaccine.  Keep up the good work.  This is a list of the screening recommended for you and due dates:  Health Maintenance  Topic Date Due   DTaP/Tdap/Td vaccine (3 - Tdap) 01/02/2023*   Flu Shot  04/02/2023*   Medicare Annual Wellness Visit  11/06/2023   Colon Cancer Screening  06/29/2030   DEXA scan (bone density measurement)  Completed   Hepatitis C Screening  Completed   HPV Vaccine  Aged Out   Pneumonia Vaccine  Discontinued   Mammogram  Discontinued   COVID-19 Vaccine  Discontinued   Zoster (Shingles) Vaccine  Discontinued  *Topic was postponed. The date shown is not the original due date.    Advanced directives: (Copy Requested) Please bring a copy of your health care power of attorney and living will to the office to be added to your chart at your convenience.  Next Medicare Annual Wellness Visit scheduled for next year: Yes

## 2022-11-29 ENCOUNTER — Ambulatory Visit (INDEPENDENT_AMBULATORY_CARE_PROVIDER_SITE_OTHER): Payer: Medicare Other | Admitting: Family Medicine

## 2022-11-29 ENCOUNTER — Ambulatory Visit (INDEPENDENT_AMBULATORY_CARE_PROVIDER_SITE_OTHER): Payer: Medicare Other

## 2022-11-29 ENCOUNTER — Encounter: Payer: Self-pay | Admitting: Family Medicine

## 2022-11-29 VITALS — BP 122/80 | HR 62 | Temp 97.6°F | Ht 69.0 in

## 2022-11-29 DIAGNOSIS — J069 Acute upper respiratory infection, unspecified: Secondary | ICD-10-CM | POA: Diagnosis not present

## 2022-11-29 DIAGNOSIS — R062 Wheezing: Secondary | ICD-10-CM

## 2022-11-29 DIAGNOSIS — R0602 Shortness of breath: Secondary | ICD-10-CM

## 2022-11-29 DIAGNOSIS — J209 Acute bronchitis, unspecified: Secondary | ICD-10-CM

## 2022-11-29 MED ORDER — ACETAMINOPHEN-CODEINE 300-30 MG PO TABS: 1.0000 | ORAL_TABLET | Freq: Four times a day (QID) | ORAL | 0 refills | Status: DC | PRN

## 2022-11-29 MED ORDER — METHYLPREDNISOLONE ACETATE 80 MG/ML IJ SUSP
80.0000 mg | Freq: Once | INTRAMUSCULAR | Status: AC
  Administered 2022-11-29: 80 mg via INTRAMUSCULAR

## 2022-11-29 MED ORDER — BREZTRI AEROSPHERE 160-9-4.8 MCG/ACT IN AERO: 2.0000 | INHALATION_SPRAY | Freq: Every day | RESPIRATORY_TRACT | Status: AC

## 2022-11-29 MED ORDER — ALBUTEROL SULFATE HFA 108 (90 BASE) MCG/ACT IN AERS: 2.0000 | INHALATION_SPRAY | Freq: Four times a day (QID) | RESPIRATORY_TRACT | 11 refills | Status: DC | PRN

## 2022-11-29 MED ORDER — DOXYCYCLINE HYCLATE 100 MG PO CAPS: 100.0000 mg | ORAL_CAPSULE | Freq: Two times a day (BID) | ORAL | 0 refills | Status: DC

## 2022-11-29 NOTE — Progress Notes (Signed)
Acute Office Visit  Subjective:     Patient ID: [name], female    DOB: [birth year], 67 y.o.   MRN: [id number]  Chief Complaint  Patient presents with   Wheezing    Going on for last 3 days, does have congestive heart failure   Cough   Shortness of Breath    Wheezing  Associated symptoms include coughing and shortness of breath.  Cough Associated symptoms include shortness of breath and wheezing.  Shortness of Breath Associated symptoms include wheezing.   67 year old female presents for evaluation of cough for the last month. Reports increase of shortness of breath over the last 3 to 4 days.  States that the cough is very productive, she has not examined sputum. Her husband is accompanying her today.  She sees cardiology with [facility], states she will be getting a heart monitor soon. Has history of heart failure, and is concerned that this is exacerbating her illness.  Denies any new leg swelling, increased urination, tachycardia, hand swelling, facial swelling. Has a prescription for albuterol, has not used inhaler, states that she needs a refill.  Denies known sick contacts. Denies nausea, vomiting, abdominal pain, diarrhea, rash, fever, other symptoms. Medical history as outlined below.    Review of Systems  Respiratory:  Positive for cough, shortness of breath and wheezing.         Objective:    BP 122/80 (BP Location: Left Arm, Patient Position: Sitting, Cuff Size: Large)   Pulse 62   Temp 97.6 F (36.4 C) (Temporal)   Ht 5\' 9"  (1.753 m)   SpO2 96%   BMI 30.27 kg/m    Physical Exam Vitals and nursing note reviewed.  HENT:     Head: Normocephalic and atraumatic.     Nose: No congestion.     Mouth/Throat:     Mouth: Mucous membranes are moist.     Pharynx: Oropharynx is clear. No pharyngeal swelling, oropharyngeal exudate or posterior oropharyngeal erythema.     Comments: Cobblestoning present Eyes:     Extraocular Movements: Extraocular  movements intact.  Cardiovascular:     Rate and Rhythm: Normal rate and regular rhythm.  Pulmonary:     Effort: Pulmonary effort is normal. No respiratory distress.     Breath sounds: Examination of the right-middle field reveals wheezing and rhonchi. Examination of the left-middle field reveals wheezing and rhonchi. Wheezing and rhonchi present.     Comments: Forceful productive cough on exam Musculoskeletal:     Cervical back: Normal range of motion and neck supple.  Lymphadenopathy:     Cervical: No cervical adenopathy.  Skin:    General: Skin is warm and dry.  Neurological:     General: No focal deficit present.     Mental Status: She is alert.  Psychiatric:        Mood and Affect: Mood normal.     No results found for any visits on 11/29/22.      Assessment & Plan:  1. Acute upper respiratory infection  - DG Chest 2 View - methylPREDNISolone acetate (DEPO-MEDROL) injection 80 mg - doxycycline (VIBRAMYCIN) 100 MG capsule; Take 1 capsule (100 mg total) by mouth 2 (two) times daily.  Dispense: 14 capsule; Refill: 0 - Xray negative for pneumonia - Discussed treatment with Dr Jonny Ruiz in the office, decided upon doxycycline, least interaction with warfarin, considered adding azithro, concern for bleeding risk given past GI bleed with sulfa abx  2. Acute bronchitis, unspecified organism  - DG Chest  2 View - albuterol (VENTOLIN HFA) 108 (90 Base) MCG/ACT inhaler; Inhale 2 puffs into the lungs every 6 (six) hours as needed for wheezing or shortness of breath.  Dispense: 8 g; Refill: 11 - methylPREDNISolone acetate (DEPO-MEDROL) injection 80 mg  3. SOB (shortness of breath)  - Budeson-Glycopyrrol-Formoterol (BREZTRI AEROSPHERE) 160-9-4.8 MCG/ACT AERO; Inhale 2 puffs into the lungs daily for 14 days. - albuterol (VENTOLIN HFA) 108 (90 Base) MCG/ACT inhaler; Inhale 2 puffs into the lungs every 6 (six) hours as needed for wheezing or shortness of breath.  Dispense: 8 g; Refill: 11 -  methylPREDNISolone acetate (DEPO-MEDROL) injection 80 mg  4. Wheezing  - albuterol (VENTOLIN HFA) 108 (90 Base) MCG/ACT inhaler; Inhale 2 puffs into the lungs every 6 (six) hours as needed for wheezing or shortness of breath.  Dispense: 8 g; Refill: 11 - methylPREDNISolone acetate (DEPO-MEDROL) injection 80 mg   Meds ordered this encounter  Medications   DISCONTD: acetaminophen-codeine (TYLENOL/CODEINE #3) 300-30 MG tablet    Sig: Take 1 tablet by mouth every 6 (six) hours as needed.    Dispense:  120 tablet    Refill:  0   Budeson-Glycopyrrol-Formoterol (BREZTRI AEROSPHERE) 160-9-4.8 MCG/ACT AERO    Sig: Inhale 2 puffs into the lungs daily for 14 days.   albuterol (VENTOLIN HFA) 108 (90 Base) MCG/ACT inhaler    Sig: Inhale 2 puffs into the lungs every 6 (six) hours as needed for wheezing or shortness of breath.    Dispense:  8 g    Refill:  11   methylPREDNISolone acetate (DEPO-MEDROL) injection 80 mg   acetaminophen-codeine (TYLENOL/CODEINE #3) 300-30 MG tablet    Sig: Take 1 tablet by mouth every 6 (six) hours as needed.    Dispense:  120 tablet    Refill:  0   doxycycline (VIBRAMYCIN) 100 MG capsule    Sig: Take 1 capsule (100 mg total) by mouth 2 (two) times daily.    Dispense:  14 capsule    Refill:  0    Return if symptoms worsen or fail to improve.  Moshe Cipro, FNP

## 2022-12-06 ENCOUNTER — Ambulatory Visit: Payer: Medicare Other | Admitting: Internal Medicine

## 2023-01-02 ENCOUNTER — Telehealth: Payer: Self-pay | Admitting: Internal Medicine

## 2023-01-02 MED ORDER — ACETAMINOPHEN-CODEINE 300-30 MG PO TABS: 1.0000 | ORAL_TABLET | Freq: Four times a day (QID) | ORAL | 0 refills | Status: DC | PRN

## 2023-01-02 NOTE — Telephone Encounter (Signed)
 Copied from CRM [id number]. Topic: Clinical - Medication Refill >> Jan 02, 2023 10:32 AM Graeme ORN wrote: Most Recent Primary Care Visit:  Provider: ALVIA KRABBE  Department: [facility]  Visit Type: OFFICE VISIT  Date: 11/29/2022  Medication: acetaminophen -codeine  (TYLENOL /CODEINE  #3) 300-30 MG tablet  Has the patient contacted their pharmacy? Yes - has not yet been received  (Agent: If no, request that the patient contact the pharmacy for the refill. If patient does not wish to contact the pharmacy document the reason why and proceed with request.) (Agent: If yes, when and what did the pharmacy advise?)  Is this the correct pharmacy for this prescription? Yes If no, delete pharmacy and type the correct one.  This is the patient's preferred pharmacy:  [facility] [address] [place], [place] - [address] AT [facility] [address] [place] [address] Phone: [phone] Fax: [phone]   Has the prescription been filled recently? No  Is the patient out of the medication? Yes - needs in order to eat   Has the patient been seen for an appointment in the last year OR does the patient have an upcoming appointment? Yes  Can we respond through MyChart? No  Agent: Please be advised that Rx refills may take up to 3 business days. We ask that you follow-up with your pharmacy.

## 2023-01-02 NOTE — Telephone Encounter (Signed)
 Done erx

## 2023-01-23 ENCOUNTER — Telehealth: Payer: Self-pay | Admitting: Neurology

## 2023-01-23 ENCOUNTER — Other Ambulatory Visit: Payer: Self-pay | Admitting: Neurology

## 2023-01-23 DIAGNOSIS — R55 Syncope and collapse: Secondary | ICD-10-CM

## 2023-01-23 DIAGNOSIS — R4689 Other symptoms and signs involving appearance and behavior: Secondary | ICD-10-CM

## 2023-01-23 MED ORDER — ZONISAMIDE 100 MG PO CAPS: ORAL_CAPSULE | ORAL | 2 refills | Status: DC

## 2023-01-23 NOTE — Telephone Encounter (Signed)
Pls let her know Rx has been sent until her f/u, thanks

## 2023-01-23 NOTE — Telephone Encounter (Signed)
Pt said she needs a refill on her zonisamide 100mg . She didn't realize she missed her appt in 07/2022. She was in hospital. She made an appt for [facility] first available which is August.  Pharmacy- [facility] in [place] on [address]

## 2023-01-24 ENCOUNTER — Ambulatory Visit: Payer: BC Managed Care – PPO | Admitting: Internal Medicine

## 2023-01-24 ENCOUNTER — Encounter: Payer: Self-pay | Admitting: Internal Medicine

## 2023-01-24 VITALS — BP 132/76 | HR 62 | Temp 98.0°F | Ht 69.0 in

## 2023-01-24 DIAGNOSIS — E785 Hyperlipidemia, unspecified: Secondary | ICD-10-CM | POA: Diagnosis not present

## 2023-01-24 DIAGNOSIS — E559 Vitamin D deficiency, unspecified: Secondary | ICD-10-CM

## 2023-01-24 DIAGNOSIS — I10 Essential (primary) hypertension: Secondary | ICD-10-CM | POA: Diagnosis not present

## 2023-01-24 DIAGNOSIS — H9203 Otalgia, bilateral: Secondary | ICD-10-CM

## 2023-01-24 MED ORDER — ACETAMINOPHEN-CODEINE 300-30 MG PO TABS: 1.0000 | ORAL_TABLET | Freq: Four times a day (QID) | ORAL | 0 refills | Status: DC | PRN

## 2023-01-24 MED ORDER — CEFUROXIME AXETIL 250 MG PO TABS: 250.0000 mg | ORAL_TABLET | Freq: Two times a day (BID) | ORAL | 1 refills | Status: DC

## 2023-01-24 NOTE — Telephone Encounter (Signed)
Pt called no answer left a voice mail Rx has been sent until her f/u

## 2023-01-24 NOTE — Progress Notes (Signed)
Patient ID: [name], female   DOB: 02/07/1955, 68 y.o.   MRN: [id number]        Chief Complaint: follow up bilat ear pain, htn, low vit d, hld       HPI:  [name] is a 68 y.o. female here overall doing ok but has 3 days onset bilat ear pain with feverish, slight ST and cough, similar to previous episodes,  Pt denies chest pain, increased sob or doe, wheezing, orthopnea, PND, increased LE swelling, palpitations, dizziness or syncope.   Pt denies polydipsia, polyuria, or new focal neuro s/s.    Pt denies fever, wt loss, night sweats, loss of appetite, or other constitutional symptoms         Wt Readings from Last 3 Encounters:  11/06/22 205 lb (93 kg)  12/28/21 205 lb (93 kg)  04/25/21 199 lb 15.3 oz (90.7 kg)   BP Readings from Last 3 Encounters:  01/24/23 132/76  11/29/22 122/80  10/31/22 (!) 134/90         Past Medical History:  Diagnosis Date   Abdominal pain, epigastric 10/25/2007   ADD 01/20/2009   AKI (acute kidney injury) (HCC) 04/11/2016   ALLERGIC RHINITIS 10/15/2006   ANGIOEDEMA 08/12/2008   ANXIETY 10/15/2006   ASTHMATIC BRONCHITIS, ACUTE 01/27/2008   Bowel obstruction (HCC)    Breast cancer (HCC)    Cellulitis and abscess of leg, except foot 08/12/2007   CELLULITIS, FACE 12/21/2008   CHB (complete heart block) (HCC) 04/14/2016   CHEST PAIN 06/14/2007   Chronic anticoagulation 05/16/2016   Chronic sinus infection 04/12/2010   COLECTOMY, PARTIAL, WITH ANASTOMOSIS, HX OF 10/15/2006   COLONIC POLYPS, HX OF 10/15/2006   CYST, OVARIAN NEC/NOS 10/15/2006   Dengue 02/22/2010   DVT, HX OF 10/15/2006   DYSAUTONOMIA 10/15/2006   Dysfunction of eustachian tube 05/26/2009   GI BLEEDING 04/04/2007   Glossitis 03/06/2008   HEADACHE, CHRONIC 04/04/2007   Heart murmur    Hematemesis 10/25/2007   HEMORRHOIDS 04/04/2007   HYPERLIPIDEMIA 12/12/2007   HYPERTENSION 10/15/2006   Hypertrophic cardiomyopathy (HCC)    HYPOTHYROIDISM 03/06/2008   IBS 04/04/2007   MASTECTOMY, BILATERAL, HX  OF 08/12/2009   NECK MASS 07/01/2007   NEOP, MALIGNANT, FEMALE BREAST NOS 10/15/2006   NEOP, MALIGNANT, 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   Ovarian cancer (HCC)    Presence of cardiac pacemaker 04/25/2016   Red blood cell antibody positive    K   Seizures (HCC)    Subdural hematoma (HCC)    SYNCOPE 07/01/2007   Wheezing 08/20/2008   WOUND, OPEN, LEG, WITHOUT COMPLICATION 08/16/2007   Past Surgical History:  Procedure Laterality Date   ABDOMINAL HYSTERECTOMY     APPENDECTOMY     BREAST RECONSTRUCTION     CARDIAC ELECTROPHYSIOLOGY MAPPING AND ABLATION     CESAREAN SECTION     CHOLECYSTECTOMY     COLON RESECTION  2005   COLON SURGERY  06/2020   COLONOSCOPY W/ BIOPSIES     FLEXIBLE SIGMOIDOSCOPY     LEFT AND RIGHT HEART CATHETERIZATION WITH CORONARY ANGIOGRAM N/A 10/07/2012   Procedure: LEFT AND RIGHT HEART CATHETERIZATION WITH CORONARY ANGIOGRAM;  Surgeon: Dolores Patty, MD;  Location: [facility] CATH LAB;  Service: Cardiovascular;  Laterality: N/A;   MASTECTOMY Bilateral    MINIMALLY INVASIVE TRICUSPID VALVE REPAIR     MITRAL VALVE REPAIR  2017   MITRAL VALVE REPLACEMENT     OOPHORECTOMY     SPIGELIAN HERNIA      reports that she  has never smoked. She has never used smokeless tobacco. She reports that she does not currently use alcohol. She reports that she does not use drugs. family history includes Diabetes in her son; Heart disease in her mother; Melanoma in her father; Ovarian cancer in her sister; Pancreatic cancer in her father; Uterine cancer in her mother. Allergies  Allergen Reactions   Fish Allergy Anaphylaxis and Swelling    Scaled fish, like flounder   Penicillins Anaphylaxis, Hives and Rash   Levofloxacin In D5w Nausea And Vomiting and Swelling    Causes throat swelling   Nitroglycerin Other (See Comments)    Loss of consciousness    Pneumovax [Pneumococcal Polysaccharide Vaccine] Other (See Comments)    Rash, arm swelling   Sulfamethoxazole     Other reaction(s): GI  bleeding Other reaction(s): Blood Disorder   Latex Dermatitis and Rash    If worn for more than a day, this causes blisters    Current Outpatient Medications on File Prior to Visit  Medication Sig Dispense Refill   albuterol (VENTOLIN HFA) 108 (90 Base) MCG/ACT inhaler Inhale 2 puffs into the lungs every 6 (six) hours as needed for wheezing or shortness of breath. 8 g 11   doxycycline (VIBRAMYCIN) 100 MG capsule Take 1 capsule (100 mg total) by mouth 2 (two) times daily. 14 capsule 0   EPINEPHrine (EPIPEN 2-PAK) 0.3 mg/0.3 mL IJ SOAJ injection Inject 0.3 mg into the muscle as needed for anaphylaxis. 1 each 2   levothyroxine (SYNTHROID) 50 MCG tablet Take 1 tablet (50 mcg total) by mouth daily. TAKE 1 TABLET(50 MCG) BY MOUTH EVERY DAY Strength: 50 mcg 90 tablet 3   metoprolol succinate (TOPROL-XL) 50 MG 24 hr tablet Take 50 mg by mouth daily.     Multiple Vitamins-Minerals (ZINC PO) Take 1 capsule by mouth daily.     ondansetron (ZOFRAN) 4 MG tablet Take 1 tablet (4 mg total) by mouth every 8 (eight) hours as needed for nausea or vomiting. 30 tablet 0   ondansetron (ZOFRAN-ODT) 8 MG disintegrating tablet Take 1 tablet (8 mg total) by mouth every 8 (eight) hours as needed for nausea. 30 tablet 0   venlafaxine XR (EFFEXOR-XR) 150 MG 24 hr capsule Take 1 capsule (150 mg total) by mouth daily. 90 capsule 3   vitamin C (ASCORBIC ACID) 500 MG tablet Take 500 mg by mouth daily.      VITAMIN D PO Take 1 tablet by mouth daily.     warfarin (COUMADIN) 10 MG tablet Take 1 tablet daily or as directed by anticoagulation clinic 90 tablet 0   warfarin (COUMADIN) 5 MG tablet Take 5-10 mg by mouth daily. Take 5mg  by mouth on SAT MON WED FRI in the evening, then take 10mg  on SUN TUE THU.     zolpidem (AMBIEN) 10 MG tablet TAKE 1 TABLET BY MOUTH EVERY DAY AT BEDTIME AS NEEDED 90 tablet 1   zonisamide (ZONEGRAN) 100 MG capsule TAKE 4 CAPSULES BY MOUTH EVERY NIGHT 360 capsule 2   No current facility-administered  medications on file prior to visit.        ROS:  All others reviewed and negative.  Objective        PE:  BP 132/76 (BP Location: Right Arm, Patient Position: Sitting, Cuff Size: Normal)   Pulse 62   Temp 98 F (36.7 C) (Oral)   Ht 5\' 9"  (1.753 m)   SpO2 98%   BMI 30.27 kg/m  Constitutional: Pt appears in NAD               HENT: Head: NCAT.                Right Ear: External ear normal.                 Left Ear: External ear normal. Bilat Tms red with effusion               Eyes: . Pupils are equal, round, and reactive to light. Conjunctivae and EOM are normal               Nose: without d/c or deformity               Neck: Neck supple. Gross normal ROM               Cardiovascular: Normal rate and regular rhythm.                 Pulmonary/Chest: Effort normal and breath sounds without rales or wheezing.                Abd:  Soft, NT, ND, + BS, no organomegaly               Neurological: Pt is alert. At baseline orientation, motor grossly intact               Skin: Skin is warm. No rashes, no other new lesions, LE edema - none               Psychiatric: Pt behavior is normal without agitation   Micro: none  Cardiac tracings I have personally interpreted today:  none  Pertinent Radiological findings (summarize): none   Lab Results  Component Value Date   WBC 6.5 06/29/2022   HGB 13.3 06/29/2022   HCT 39.5 06/29/2022   PLT 271.0 06/29/2022   GLUCOSE 92 06/29/2022   CHOL 219 (H) 06/29/2022   TRIG 320.0 (H) 06/29/2022   HDL 41.00 06/29/2022   LDLDIRECT 141.0 06/29/2022   LDLCALC 73 02/11/2008   ALT 20 06/29/2022   AST 20 06/29/2022   NA 141 06/29/2022   K 3.8 06/29/2022   CL 110 06/29/2022   CREATININE 0.92 06/29/2022   BUN 15 06/29/2022   CO2 24 06/29/2022   TSH 3.82 06/29/2022   INR 2.5 (H) 04/29/2021   HGBA1C 5.3 06/29/2022   Assessment/Plan:  [name] is a 68 y.o. White or Caucasian [1] female with  has a past medical history of  Abdominal pain, epigastric (10/25/2007), ADD (01/20/2009), AKI (acute kidney injury) (HCC) (04/11/2016), ALLERGIC RHINITIS (10/15/2006), ANGIOEDEMA (08/12/2008), ANXIETY (10/15/2006), ASTHMATIC BRONCHITIS, ACUTE (01/27/2008), Bowel obstruction (HCC), Breast cancer (HCC), Cellulitis and abscess of leg, except foot (08/12/2007), CELLULITIS, FACE (12/21/2008), CHB (complete heart block) (HCC) (04/14/2016), CHEST PAIN (06/14/2007), Chronic anticoagulation (05/16/2016), Chronic sinus infection (04/12/2010), COLECTOMY, PARTIAL, WITH ANASTOMOSIS, HX OF (10/15/2006), COLONIC POLYPS, HX OF (10/15/2006), CYST, OVARIAN NEC/NOS (10/15/2006), Dengue (02/22/2010), DVT, HX OF (10/15/2006), DYSAUTONOMIA (10/15/2006), Dysfunction of eustachian tube (05/26/2009), GI BLEEDING (04/04/2007), Glossitis (03/06/2008), HEADACHE, CHRONIC (04/04/2007), Heart murmur, Hematemesis (10/25/2007), HEMORRHOIDS (04/04/2007), HYPERLIPIDEMIA (12/12/2007), HYPERTENSION (10/15/2006), Hypertrophic cardiomyopathy (HCC), HYPOTHYROIDISM (03/06/2008), IBS (04/04/2007), MASTECTOMY, BILATERAL, HX OF (08/12/2009), NECK MASS (07/01/2007), NEOP, MALIGNANT, FEMALE BREAST NOS (10/15/2006), NEOP, MALIGNANT, THYMUS (10/15/2006), Ovarian cancer (HCC), Presence of cardiac pacemaker (04/25/2016), Red blood cell antibody positive, Seizures (HCC), Subdural hematoma (HCC), SYNCOPE (07/01/2007), Wheezing (08/20/2008), and WOUND, OPEN, LEG, WITHOUT COMPLICATION (08/16/2007).  Ear pain,  bilateral C/w infecitous - for ceftin 250 bid course,  to f/u any worsening symptoms or concerns  Essential hypertension BP Readings from Last 3 Encounters:  01/24/23 132/76  11/29/22 122/80  10/31/22 (!) 134/90   Stable, pt to continue medical treatment toprol xl 50 qqd   Vitamin D deficiency Last vitamin D Lab Results  Component Value Date   VD25OH 24.44 (L) 06/29/2022   Low to start oral replacement   HLD (hyperlipidemia) Lab Results  Component Value Date   LDLCALC 73 02/11/2008    Uncontrolled, goal ldl < 70, pt to continue low chol diet, declines further change today  Followup: Return in about 6 months (around 07/24/2023).  Oliver Barre, MD 01/27/2023 8:30 PM [facility]  Primary Care - [facility] Internal [facility]

## 2023-01-24 NOTE — Patient Instructions (Addendum)
Please take all new medication as prescribed - the antibiotic,  Please continue all other medications as before, and refills have been done if requested - the T#3  Please have the pharmacy call with any other refills you may need.  Please continue your efforts at being more active, low cholesterol diet, and weight control.  Please keep your appointments with your specialists as you may have planned - neurology aug 20  Please make an Appointment to return in 6 months, or sooner if needed

## 2023-01-27 ENCOUNTER — Encounter: Payer: Self-pay | Admitting: Internal Medicine

## 2023-01-27 NOTE — Assessment & Plan Note (Signed)
Last vitamin D Lab Results  Component Value Date   VD25OH 24.44 (L) 06/29/2022   Low to start oral replacement

## 2023-01-27 NOTE — Assessment & Plan Note (Signed)
BP Readings from Last 3 Encounters:  01/24/23 132/76  11/29/22 122/80  10/31/22 (!) 134/90   Stable, pt to continue medical treatment toprol xl 50 qqd

## 2023-01-27 NOTE — Assessment & Plan Note (Signed)
Lab Results  Component Value Date   LDLCALC 73 02/11/2008   Uncontrolled, goal ldl < 70, pt to continue low chol diet, declines further change today

## 2023-01-27 NOTE — Assessment & Plan Note (Signed)
C/w infecitous - for ceftin 250 bid course,  to f/u any worsening symptoms or concerns

## 2023-02-06 ENCOUNTER — Other Ambulatory Visit: Payer: Self-pay

## 2023-02-06 ENCOUNTER — Ambulatory Visit: Payer: Self-pay | Admitting: Internal Medicine

## 2023-02-06 MED ORDER — EPINEPHRINE 0.3 MG/0.3ML IJ SOAJ: 0.3000 mg | INTRAMUSCULAR | 2 refills | Status: AC | PRN

## 2023-02-06 NOTE — Telephone Encounter (Signed)
 Refill has been sent.

## 2023-02-06 NOTE — Telephone Encounter (Signed)
 Chief Complaint: Epi pen refill Symptoms: allergic reaction Frequency: now Pertinent Negatives: Patient denies n/a Disposition: [x] ED /[] Urgent Care (no appt availability in office) / [] Appointment(In office/virtual)/ []  [facility]/ [] Home Care/ [] Refused Recommended Disposition /[] [place] [facility]/ []  Follow-up with PCP Additional Notes: Patient's husband called in to request stat refill of patient's Epi pen. Patient experienced an allergic reaction yesterday and used the last epi pen, and husband states patient is currently experiencing an allergic reaction in which she is going to the emergency room (being taken by a friend) right now. Advised patient I will request stat refill of patient's epi pen over to PCP now.   Copied from CRM [id number]. Topic: Clinical - Red Word Triage >> Feb 06, 2023 10:19 AM Robinson H wrote: [facility] that prompted transfer to Nurse Triage: Patient is having another allergic reaction had one same time yesterday and had to use last EPINEPHrine  (EPIPEN  2-PAK) 0.3 mg/0.3 mL IJ SOAJ injection. Having skin rash, swelling, hives.    Reason for Disposition  [1] Caller requesting a prescription renewal (no refills left), no triage required, AND [2] triager able to renew prescription per department policy  Answer Assessment - Initial Assessment Questions 1. DRUG NAME: What medicine do you need to have refilled?     Epi Pen 2. REFILLS REMAINING: How many refills are remaining? (Note: The label on the medicine or pill bottle will show how many refills are remaining. If there are no refills remaining, then a renewal may be needed.)     2 3. EXPIRATION DATE: What is the expiration date? (Note: The label states when the prescription will expire, and thus can no longer be refilled.)     10/30/24 4. PRESCRIBING HCP: Who prescribed it? Reason: If prescribed by specialist, call should be referred to that group.     Dr. Norleen 5. SYMPTOMS: Do you have any  symptoms?     Patient currently experiencing allergic reaction is on the way to hospital with a friend  Protocols used: Medication Refill and Renewal Call-A-AH

## 2023-02-07 ENCOUNTER — Ambulatory Visit: Payer: Self-pay | Admitting: Internal Medicine

## 2023-02-07 ENCOUNTER — Encounter: Payer: Self-pay | Admitting: Internal Medicine

## 2023-02-07 NOTE — Telephone Encounter (Signed)
 Patient is having another allergic reaction(as documented in triage note) and declined ED.

## 2023-02-07 NOTE — Telephone Encounter (Signed)
 Information obtained from frank husband.   Chief Complaint: multiple allergic rxns Symptoms: scratchy/hoarse voice, swollen tongue, red and swollen face. Frequency: lunch today Pertinent Negatives: Patient denies difficulty breathing  Disposition: [x] ED /[] Urgent Care (no appt availability in office) / [] Appointment(In office/virtual)/ []  [place] [facility]/ [] Home Care/ [x] Refused Recommended Disposition /[] [place] [facility]/ []  Follow-up with PCP  Additional Notes: Pt has had multiple significant allergic rxns in the past week. Including today, pt was eating lunch and developed a swollen tongue, red and swollen face, and a hoarse voice. RN advised at this time that pt needs to be seen in the ED. Pt refuses, husband and pt state that d/t the pts complex med hx they want to see PCP and that they are aware that the advice is to seek ED treatment. Both patient and her husband confirm that they have reached out to PCP and updated him on the numerous recent rxns, and that the pcp would like them to get be seen at the clinic ASAP.  RN reiterated the importance of seeking ED treatment. Pt refused.  CAL called, [name] will relay the message to appropriate individuals.   Copied from CRM [id number]. Topic: Clinical - Red Word Triage >> Feb 07, 2023  2:50 PM [name] wrote: Red Word that prompted transfer to Nurse Triage: Patient representative called in regarding patient having another allergic reaction today- face swelling, red, uncomfortable. Does have set appointment for feb 14 but is wondering if anything else has opened Reason for Disposition  Started suddenly after taking a medicine or allergic food (e.g., nuts)  Answer Assessment - Initial Assessment Questions 1. ONSET: When did the swelling start? (e.g., minutes, hours, days)     Today at lunch 2. LOCATION: What part of the tongue is swollen?     Whole tongue 3. SEVERITY: How swollen is it?     I'm biting my tongue  accidentally 4. CAUSE: What do you think is causing the tongue swelling? (e.g., history of angioedema, allergies)   Hx of allergic reactions 6. OTHER SYMPTOMS: Do you have any other symptoms? (e.g., breathing difficulty, facial swelling)     Scratchy throat, facial swelling  Protocols used: Tongue Swelling-A-AH

## 2023-02-07 NOTE — Telephone Encounter (Signed)
 Pt is scheduled to be seen on Friday and see message  below.

## 2023-02-07 NOTE — Telephone Encounter (Signed)
 Ok for OV as per this request    thanks

## 2023-02-09 ENCOUNTER — Encounter: Payer: Self-pay | Admitting: Internal Medicine

## 2023-02-09 ENCOUNTER — Ambulatory Visit (INDEPENDENT_AMBULATORY_CARE_PROVIDER_SITE_OTHER): Payer: BC Managed Care – PPO | Admitting: Internal Medicine

## 2023-02-09 VITALS — BP 128/76 | HR 60 | Temp 98.2°F | Ht 69.0 in

## 2023-02-09 DIAGNOSIS — I10 Essential (primary) hypertension: Secondary | ICD-10-CM | POA: Diagnosis not present

## 2023-02-09 DIAGNOSIS — R062 Wheezing: Secondary | ICD-10-CM

## 2023-02-09 DIAGNOSIS — R0602 Shortness of breath: Secondary | ICD-10-CM

## 2023-02-09 DIAGNOSIS — T783XXD Angioneurotic edema, subsequent encounter: Secondary | ICD-10-CM

## 2023-02-09 DIAGNOSIS — E559 Vitamin D deficiency, unspecified: Secondary | ICD-10-CM | POA: Diagnosis not present

## 2023-02-09 DIAGNOSIS — J209 Acute bronchitis, unspecified: Secondary | ICD-10-CM

## 2023-02-09 DIAGNOSIS — H9202 Otalgia, left ear: Secondary | ICD-10-CM

## 2023-02-09 LAB — CBC WITH DIFFERENTIAL/PLATELET
Basophils Absolute: 0.1 10*3/uL (ref 0.0–0.1)
Basophils Relative: 0.7 % (ref 0.0–3.0)
Eosinophils Absolute: 0.5 10*3/uL (ref 0.0–0.7)
Eosinophils Relative: 4.9 % (ref 0.0–5.0)
HCT: 40.7 % (ref 36.0–46.0)
Hemoglobin: 13.9 g/dL (ref 12.0–15.0)
Lymphocytes Relative: 22.1 % (ref 12.0–46.0)
Lymphs Abs: 2.1 10*3/uL (ref 0.7–4.0)
MCHC: 34.2 g/dL (ref 30.0–36.0)
MCV: 89.3 fL (ref 78.0–100.0)
Monocytes Absolute: 0.6 10*3/uL (ref 0.1–1.0)
Monocytes Relative: 6.3 % (ref 3.0–12.0)
Neutro Abs: 6.2 10*3/uL (ref 1.4–7.7)
Neutrophils Relative %: 66 % (ref 43.0–77.0)
Platelets: 319 10*3/uL (ref 150.0–400.0)
RBC: 4.55 Mil/uL (ref 3.87–5.11)
RDW: 14.2 % (ref 11.5–15.5)
WBC: 9.3 10*3/uL (ref 4.0–10.5)

## 2023-02-09 LAB — BASIC METABOLIC PANEL
BUN: 17 mg/dL (ref 6–23)
CO2: 26 meq/L (ref 19–32)
Calcium: 8.9 mg/dL (ref 8.4–10.5)
Chloride: 107 meq/L (ref 96–112)
Creatinine, Ser: 0.97 mg/dL (ref 0.40–1.20)
GFR: 60.15 mL/min (ref >60.00)
Glucose, Bld: 105 mg/dL — ABNORMAL HIGH (ref 70–99)
Potassium: 3.7 meq/L (ref 3.5–5.1)
Sodium: 142 meq/L (ref 135–145)

## 2023-02-09 MED ORDER — ALBUTEROL SULFATE HFA 108 (90 BASE) MCG/ACT IN AERS
2.0000 | INHALATION_SPRAY | Freq: Four times a day (QID) | RESPIRATORY_TRACT | 11 refills | Status: AC | PRN
Start: 2023-02-09 — End: ?

## 2023-02-09 MED ORDER — PREDNISONE 10 MG PO TABS
ORAL_TABLET | ORAL | 0 refills | Status: DC
Start: 2023-02-09 — End: 2023-03-09

## 2023-02-09 NOTE — Progress Notes (Signed)
 The test results show that your current treatment is OK, as the tests are stable.  Please continue the same plan.  There is no other need for change of treatment or further evaluation based on these results, at this time.  thanks

## 2023-02-09 NOTE — Progress Notes (Signed)
 Patient ID: [name], female   DOB: 05-26-1955, 68 y.o.   MRN: [id number]        Chief Complaint: follow up angioedema       HPI:  [name] is a 68 y.o. female here with c/o recurring angioedema face and extremities since Monday without clear etiology having finished her recent antibx, not on ACE.  Had marked reaction feb 3 tx with epipen  at home with some anxiety and sob, and benadryl , but recurred next day seen in ED, then feb 5 with recurring swelling again managed with a handful of benadryl  25 mg with some sleepiness, then again yesterday with benadryl , but today seems to have calmed without having to use benadryl .  Pt denies chest pain, increased sob or doe, wheezing, orthopnea, PND, increased LE swelling, palpitations, dizziness or syncope.   Pt denies polydipsia, polyuria, or new focal neuro s/s.          Wt Readings from Last 3 Encounters:  11/06/22 205 lb (93 kg)  12/28/21 205 lb (93 kg)  04/25/21 199 lb 15.3 oz (90.7 kg)   BP Readings from Last 3 Encounters:  02/09/23 128/76  01/24/23 132/76  11/29/22 122/80         Past Medical History:  Diagnosis Date   Abdominal pain, epigastric 10/25/2007   ADD 01/20/2009   AKI (acute kidney injury) (HCC) 04/11/2016   ALLERGIC RHINITIS 10/15/2006   ANGIOEDEMA 08/12/2008   ANXIETY 10/15/2006   ASTHMATIC BRONCHITIS, ACUTE 01/27/2008   Bowel obstruction (HCC)    Breast cancer (HCC)    Cellulitis and abscess of leg, except foot 08/12/2007   CELLULITIS, FACE 12/21/2008   CHB (complete heart block) (HCC) 04/14/2016   CHEST PAIN 06/14/2007   Chronic anticoagulation 05/16/2016   Chronic sinus infection 04/12/2010   COLECTOMY, PARTIAL, WITH ANASTOMOSIS, HX OF 10/15/2006   COLONIC POLYPS, HX OF 10/15/2006   CYST, OVARIAN NEC/NOS 10/15/2006   Dengue 02/22/2010   DVT, HX OF 10/15/2006   DYSAUTONOMIA 10/15/2006   Dysfunction of eustachian tube 05/26/2009   GI BLEEDING 04/04/2007   Glossitis 03/06/2008   HEADACHE, CHRONIC 04/04/2007   Heart  murmur    Hematemesis 10/25/2007   HEMORRHOIDS 04/04/2007   HYPERLIPIDEMIA 12/12/2007   HYPERTENSION 10/15/2006   Hypertrophic cardiomyopathy (HCC)    HYPOTHYROIDISM 03/06/2008   IBS 04/04/2007   MASTECTOMY, BILATERAL, HX OF 08/12/2009   NECK MASS 07/01/2007   NEOP, MALIGNANT, FEMALE BREAST NOS 10/15/2006   NEOP, MALIGNANT, 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   Ovarian cancer (HCC)    Presence of cardiac pacemaker 04/25/2016   Red blood cell antibody positive    K   Seizures (HCC)    Subdural hematoma (HCC)    SYNCOPE 07/01/2007   Wheezing 08/20/2008   WOUND, OPEN, LEG, WITHOUT COMPLICATION 08/16/2007   Past Surgical History:  Procedure Laterality Date   ABDOMINAL HYSTERECTOMY     APPENDECTOMY     BREAST RECONSTRUCTION     CARDIAC ELECTROPHYSIOLOGY MAPPING AND ABLATION     CESAREAN SECTION     CHOLECYSTECTOMY     COLON RESECTION  2005   COLON SURGERY  06/2020   COLONOSCOPY W/ BIOPSIES     FLEXIBLE SIGMOIDOSCOPY     LEFT AND RIGHT HEART CATHETERIZATION WITH CORONARY ANGIOGRAM N/A 10/07/2012   Procedure: LEFT AND RIGHT HEART CATHETERIZATION WITH CORONARY ANGIOGRAM;  Surgeon: Toribio JONELLE Fuel, MD;  Location: [facility] CATH LAB;  Service: Cardiovascular;  Laterality: N/A;   MASTECTOMY Bilateral    MINIMALLY INVASIVE TRICUSPID VALVE  REPAIR     MITRAL VALVE REPAIR  2017   MITRAL VALVE REPLACEMENT     OOPHORECTOMY     SPIGELIAN HERNIA      reports that she has never smoked. She has never used smokeless tobacco. She reports that she does not currently use alcohol. She reports that she does not use drugs. family history includes Diabetes in her son; Heart disease in her mother; Melanoma in her father; Ovarian cancer in her sister; Pancreatic cancer in her father; Uterine cancer in her mother. Allergies  Allergen Reactions   Fish Allergy  Anaphylaxis and Swelling    Scaled fish, like flounder   Penicillins Anaphylaxis, Hives and Rash   Levofloxacin  In D5w Nausea And Vomiting and Swelling    Causes throat  swelling   Nitroglycerin  Other (See Comments)    Loss of consciousness    Pneumovax [Pneumococcal Polysaccharide Vaccine] Other (See Comments)    Rash, arm swelling   Sulfamethoxazole      Other reaction(s): GI bleeding Other reaction(s): Blood Disorder   Latex Dermatitis and Rash    If worn for more than a day, this causes blisters    Current Outpatient Medications on File Prior to Visit  Medication Sig Dispense Refill   acetaminophen -codeine  (TYLENOL /CODEINE  #3) 300-30 MG tablet Take 1 tablet by mouth every 6 (six) hours as needed. 120 tablet 0   EPINEPHrine  (EPIPEN  2-PAK) 0.3 mg/0.3 mL IJ SOAJ injection Inject 0.3 mg into the muscle as needed for anaphylaxis. 1 each 2   levothyroxine  (SYNTHROID ) 50 MCG tablet Take 1 tablet (50 mcg total) by mouth daily. TAKE 1 TABLET(50 MCG) BY MOUTH EVERY DAY Strength: 50 mcg 90 tablet 3   metoprolol  succinate (TOPROL -XL) 50 MG 24 hr tablet Take 50 mg by mouth daily.     Multiple Vitamins-Minerals (ZINC PO) Take 1 capsule by mouth daily.     ondansetron  (ZOFRAN ) 4 MG tablet Take 1 tablet (4 mg total) by mouth every 8 (eight) hours as needed for nausea or vomiting. 30 tablet 0   ondansetron  (ZOFRAN -ODT) 8 MG disintegrating tablet Take 1 tablet (8 mg total) by mouth every 8 (eight) hours as needed for nausea. 30 tablet 0   venlafaxine  XR (EFFEXOR -XR) 150 MG 24 hr capsule Take 1 capsule (150 mg total) by mouth daily. 90 capsule 3   vitamin C (ASCORBIC ACID) 500 MG tablet Take 500 mg by mouth daily.      VITAMIN D  PO Take 1 tablet by mouth daily.     warfarin (COUMADIN ) 10 MG tablet Take 1 tablet daily or as directed by anticoagulation clinic 90 tablet 0   warfarin (COUMADIN ) 5 MG tablet Take 5-10 mg by mouth daily. Take 5mg  by mouth on SAT MON WED FRI in the evening, then take 10mg  on SUN TUE THU.     zolpidem  (AMBIEN ) 10 MG tablet TAKE 1 TABLET BY MOUTH EVERY DAY AT BEDTIME AS NEEDED 90 tablet 1   zonisamide  (ZONEGRAN ) 100 MG capsule TAKE 4 CAPSULES BY  MOUTH EVERY NIGHT 360 capsule 2   No current facility-administered medications on file prior to visit.        ROS:  All others reviewed and negative.  Objective        PE:  BP 128/76 (BP Location: Right Arm, Patient Position: Sitting, Cuff Size: Normal)   Pulse 60   Temp 98.2 F (36.8 C) (Oral)   Ht 5' 9 (1.753 m)   SpO2 98%   BMI 30.27 kg/m  Constitutional: Pt appears in NAD               HENT: Head: NCAT.                Right Ear: External ear normal.                 Left Ear: External ear normal.                Eyes: . Pupils are equal, round, and reactive to light. Conjunctivae and EOM are normal               Nose: without d/c or deformity               Neck: Neck supple. Gross normal ROM               Cardiovascular: Normal rate and regular rhythm.                 Pulmonary/Chest: Effort normal and breath sounds without rales or wheezing.                Abd:  Soft, NT, ND, + BS, no organomegaly               Neurological: Pt is alert. At baseline orientation, motor grossly intact               Skin: Skin is warm. No rashes, no other new lesions, LE edema - none               Psychiatric: Pt behavior is normal without agitation   Micro: none  Cardiac tracings I have personally interpreted today:  none  Pertinent Radiological findings (summarize): none   Lab Results  Component Value Date   WBC 9.3 02/09/2023   HGB 13.9 02/09/2023   HCT 40.7 02/09/2023   PLT 319.0 02/09/2023   GLUCOSE 105 (H) 02/09/2023   CHOL 219 (H) 06/29/2022   TRIG 320.0 (H) 06/29/2022   HDL 41.00 06/29/2022   LDLDIRECT 141.0 06/29/2022   LDLCALC 73 02/11/2008   ALT 20 06/29/2022   AST 20 06/29/2022   NA 142 02/09/2023   K 3.7 02/09/2023   CL 107 02/09/2023   CREATININE 0.97 02/09/2023   BUN 17 02/09/2023   CO2 26 02/09/2023   TSH 3.82 06/29/2022   INR 2.5 (H) 04/29/2021   HGBA1C 5.3 06/29/2022   Assessment/Plan:  [name] is a 68 y.o. White or Caucasian [1]  female with  has a past medical history of Abdominal pain, epigastric (10/25/2007), ADD (01/20/2009), AKI (acute kidney injury) (HCC) (04/11/2016), ALLERGIC RHINITIS (10/15/2006), ANGIOEDEMA (08/12/2008), ANXIETY (10/15/2006), ASTHMATIC BRONCHITIS, ACUTE (01/27/2008), Bowel obstruction (HCC), Breast cancer (HCC), Cellulitis and abscess of leg, except foot (08/12/2007), CELLULITIS, FACE (12/21/2008), CHB (complete heart block) (HCC) (04/14/2016), CHEST PAIN (06/14/2007), Chronic anticoagulation (05/16/2016), Chronic sinus infection (04/12/2010), COLECTOMY, PARTIAL, WITH ANASTOMOSIS, HX OF (10/15/2006), COLONIC POLYPS, HX OF (10/15/2006), CYST, OVARIAN NEC/NOS (10/15/2006), Dengue (02/22/2010), DVT, HX OF (10/15/2006), DYSAUTONOMIA (10/15/2006), Dysfunction of eustachian tube (05/26/2009), GI BLEEDING (04/04/2007), Glossitis (03/06/2008), HEADACHE, CHRONIC (04/04/2007), Heart murmur, Hematemesis (10/25/2007), HEMORRHOIDS (04/04/2007), HYPERLIPIDEMIA (12/12/2007), HYPERTENSION (10/15/2006), Hypertrophic cardiomyopathy (HCC), HYPOTHYROIDISM (03/06/2008), IBS (04/04/2007), MASTECTOMY, BILATERAL, HX OF (08/12/2009), NECK MASS (07/01/2007), NEOP, MALIGNANT, FEMALE BREAST NOS (10/15/2006), NEOP, MALIGNANT, THYMUS (10/15/2006), Ovarian cancer (HCC), Presence of cardiac pacemaker (04/25/2016), Red blood cell antibody positive, Seizures (HCC), Subdural hematoma (HCC), SYNCOPE (07/01/2007), Wheezing (08/20/2008), and WOUND, OPEN, LEG, WITHOUT COMPLICATION (08/16/2007).  Angioedema Multiple episodes in the  past wk by report and seen in ED x `1, maybe somewhat improved overall today with frequent benadryl  use; today for prednisone  taper, depomedrol 80 mg IM, albuterol  hfa prn, allergy  panel labs, and refer allergy , also refill epipen  prn use  Essential hypertension BP Readings from Last 3 Encounters:  02/09/23 128/76  01/24/23 132/76  11/29/22 122/80   Stable, pt to continue medical treatment toprol  xl 50 qd   Left ear pain Resolved today  after recent ceftin   Vitamin D  deficiency Last vitamin D  Lab Results  Component Value Date   VD25OH 24.44 (L) 06/29/2022   Low, to start oral replacement  Followup: Return if symptoms worsen or fail to improve.  Lynwood Rush, MD 02/11/2023 5:34 PM [facility]

## 2023-02-09 NOTE — Patient Instructions (Signed)
 You had the steroid shot today  Please take all new medication as prescribed - the prednisone , and the albuterol  inhaler as needed in future  Please continue all other medications as before, and refills have been done if requested.  Please have the pharmacy call with any other refills you may need.  Please continue your efforts at being more active, low cholesterol diet, and weight control.  You are otherwise up to date with prevention measures today.  Please keep your appointments with your specialists as you may have planned  You will be contacted regarding the referral for: Allergy   Please go to the LAB at the blood drawing area for the tests to be done  You will be contacted by phone if any changes need to be made immediately.  Otherwise, you will receive a letter about your results with an explanation, but please check with MyChart first.

## 2023-02-11 ENCOUNTER — Encounter: Payer: Self-pay | Admitting: Internal Medicine

## 2023-02-11 NOTE — Assessment & Plan Note (Signed)
 BP Readings from Last 3 Encounters:  02/09/23 128/76  01/24/23 132/76  11/29/22 122/80   Stable, pt to continue medical treatment toprol  xl 50 qd

## 2023-02-11 NOTE — Assessment & Plan Note (Signed)
Last vitamin D Lab Results  Component Value Date   VD25OH 24.44 (L) 06/29/2022   Low, to start oral replacement

## 2023-02-11 NOTE — Assessment & Plan Note (Signed)
 Multiple episodes in the past wk by report and seen in ED x `1, maybe somewhat improved overall today with frequent benadryl  use; today for prednisone  taper, depomedrol 80 mg IM, albuterol  hfa prn, allergy  panel labs, and refer allergy , also refill epipen  prn use

## 2023-02-11 NOTE — Assessment & Plan Note (Signed)
 Resolved today after recent ceftin 

## 2023-02-13 LAB — RESPIRATORY ALLERGY PROFILE REGION II ~~LOC~~

## 2023-02-13 LAB — FOOD ALLERGY PROFILE
Allergen, Salmon, f41: <0.1 kU/L
Almonds: 0.15 kU/L — ABNORMAL HIGH
CLASS: 0
CLASS: 0
CLASS: 0
CLASS: 0
CLASS: 0
CLASS: 0
CLASS: 0
CLASS: 0
CLASS: 0
Cashew IgE: <0.1 kU/L
Class: 0
Class: 0
Class: 0
Egg White IgE: <0.1 kU/L
Fish Cod: <0.1 kU/L
Hazelnut: <0.1 kU/L
Milk IgE: <0.1 kU/L
Peanut IgE: 0.11 kU/L — ABNORMAL HIGH
Scallop IgE: <0.1 kU/L
Sesame Seed f10: <0.1 kU/L
Shrimp IgE: <0.1 kU/L
Soybean IgE: <0.1 kU/L
Tuna IgE: <0.1 kU/L
Walnut: <0.1 kU/L
Wheat IgE: 0.1 kU/L — ABNORMAL HIGH

## 2023-02-13 LAB — INTERPRETATION:

## 2023-02-16 ENCOUNTER — Ambulatory Visit: Payer: Medicare Other | Admitting: Internal Medicine

## 2023-02-20 ENCOUNTER — Ambulatory Visit: Payer: Medicare Other | Admitting: Allergy & Immunology

## 2023-02-27 ENCOUNTER — Ambulatory Visit: Payer: Medicare Other | Admitting: Internal Medicine

## 2023-03-02 ENCOUNTER — Other Ambulatory Visit: Payer: Self-pay | Admitting: Internal Medicine

## 2023-03-02 NOTE — Telephone Encounter (Signed)
 Copied from CRM [id number]. Topic: Clinical - Medication Refill >> Mar 02, 2023  9:40 AM [name] wrote: Most Recent Primary Care Visit:  Provider: Corwin Levins  Department: [facility]  Visit Type: ACUTE  Date: 02/09/2023  Medication: acetaminophen-codeine (TYLENOL/CODEINE #3) 300-30 MG tablet   Has the patient contacted their pharmacy? No (Agent: If no, request that the patient contact the pharmacy for the refill. If patient does not wish to contact the pharmacy document the reason why and proceed with request.) (Agent: If yes, when and what did the pharmacy advise?)  Is this the correct pharmacy for this prescription? Yes If no, delete pharmacy and type the correct one.  This is the patient's preferred pharmacy:  [facility]ORE [address] [place], [place] - [address] AT [facility] [address] [place] [address] Phone: [phone] Fax: [phone]  [facility] DRUG STORE [address] [place], [place] - [address] AT [facility] [address] [place] [address] Phone: [phone] Fax: [phone]  [facility] - [place], [place] - [address] [place] [address] Phone: [phone] Fax: [phone]   Has the prescription been filled recently? No  Is the patient out of the medication? Yes  Has the patient been seen for an appointment in the last year OR does the patient have an upcoming appointment? Yes  Can we respond through MyChart? Yes  Agent: Please be advised that Rx refills may take up to 3 business days. We ask that you follow-up with your pharmacy.

## 2023-03-03 ENCOUNTER — Other Ambulatory Visit: Payer: Self-pay | Admitting: Internal Medicine

## 2023-03-03 ENCOUNTER — Encounter: Payer: Self-pay | Admitting: Internal Medicine

## 2023-03-09 ENCOUNTER — Ambulatory Visit (INDEPENDENT_AMBULATORY_CARE_PROVIDER_SITE_OTHER): Payer: Medicare Other | Admitting: Internal Medicine

## 2023-03-09 ENCOUNTER — Other Ambulatory Visit: Payer: Self-pay

## 2023-03-09 ENCOUNTER — Encounter: Payer: Self-pay | Admitting: Internal Medicine

## 2023-03-09 VITALS — BP 130/84 | HR 69 | Temp 97.5°F | Resp 18 | Ht 69.0 in | Wt 224.8 lb

## 2023-03-09 DIAGNOSIS — T7802XD Anaphylactic reaction due to shellfish (crustaceans), subsequent encounter: Secondary | ICD-10-CM | POA: Diagnosis not present

## 2023-03-09 DIAGNOSIS — T7802XA Anaphylactic reaction due to shellfish (crustaceans), initial encounter: Secondary | ICD-10-CM

## 2023-03-09 DIAGNOSIS — L501 Idiopathic urticaria: Secondary | ICD-10-CM | POA: Diagnosis not present

## 2023-03-09 DIAGNOSIS — T7803XD Anaphylactic reaction due to other fish, subsequent encounter: Secondary | ICD-10-CM

## 2023-03-09 DIAGNOSIS — T7803XA Anaphylactic reaction due to other fish, initial encounter: Secondary | ICD-10-CM

## 2023-03-09 NOTE — Patient Instructions (Addendum)
 Urticaria/Angioedema (Hives/Swelling): - At this time etiology of hives and swelling is unknown. Hives can be caused by a variety of different triggers including illness/infection, pressure, vibrations, extremes of temperature to name a few however majority of the time there is no identifiable trigger.  -We will obtain labs to rule out inflammatory/autoimmune/alpha gal/mast cell causes. -Start Zyrtec 10mg  daily.   -If no improvement in 2-3 days, increase to Zyrtec 10mg  twice daily.   -If no improvement in 2-3 days, add Pepcid 20mg  twice daily and continue Zyrtec 10mg  twice daily.   Food Allergy:  - please strictly avoid shellfish and fish.  - for SKIN only reaction, okay to take Benadryl 25mg  capsules every 6 hours as needed - for SKIN + ANY additional symptoms, OR IF concern for LIFE THREATENING reaction = Epipen Autoinjector EpiPen 0.3 mg. - If using Epinephrine autoinjector, call 911

## 2023-03-09 NOTE — Progress Notes (Signed)
 NEW PATIENT  Date of Service/Encounter:  03/09/23  Consult requested by: Corwin Levins, MD   Subjective:   [name] (DOB: 1955-05-05) is a 68 y.o. female who presents to the clinic on 03/09/2023 with a chief complaint of Allergy Testing (Environmental: Cat dander/Food: Shellfish; flour; peanut; almond) and Eczema (??) .    History obtained from: chart review and patient.   Allergic Reactions: Reports in early February, had her daughter's wedding and ate a small amount of lobster mac and cheese; she has a known allergy to seafood.  Within minutes, had facial swelling and hives with some trouble swallowing.  She took 12 benadryls but still had symptoms the next day so used her Epipen and went to the ER. Noted to have hives on exam but otherwise hemodynamically stable with clear lungs. Given medrol and pepcid with refill of Epipen. Now continues to have random episodes of hives and swelling.  Last one was about a week ago.  Wonders if it is other foods or maybe even the dog foods that they are ingesting which has seafood in it and then coming into contact with her.  Taking benadryl PRN.   Also saw PCP that showed low positives to peanut, wheat, almond; never has had issues with this in the past.   Reviewed:  02/09/2023: seen by Dr Jonny Ruiz for angioedema/SOB/anxiety.  Given prednisone taper and medrol and Epipen.    02/06/2023: seen in ED for hives and swelling.  Possibly dysphagia sensation but hemodynamically stable, not tachycardic, normal lung/heart exam. Noted to have hives on exam.   11/15/2022: seen by [facility] for HOCM, HTN, paroxysmal Afib, s/p mitral valve replacement. On warfarin and toprolol.  02/09/2023: sIgE very low positive to peanut, wheat, almonds  Past Medical History: Past Medical History:  Diagnosis Date   Abdominal pain, epigastric 10/25/2007   ADD 01/20/2009   AKI (acute kidney injury) (HCC) 04/11/2016   ALLERGIC RHINITIS 10/15/2006   ANGIOEDEMA 08/12/2008    ANXIETY 10/15/2006   ASTHMATIC BRONCHITIS, ACUTE 01/27/2008   Bowel obstruction (HCC)    Breast cancer (HCC)    Cellulitis and abscess of leg, except foot 08/12/2007   CELLULITIS, FACE 12/21/2008   CHB (complete heart block) (HCC) 04/14/2016   CHEST PAIN 06/14/2007   Chronic anticoagulation 05/16/2016   Chronic sinus infection 04/12/2010   COLECTOMY, PARTIAL, WITH ANASTOMOSIS, HX OF 10/15/2006   COLONIC POLYPS, HX OF 10/15/2006   CYST, OVARIAN NEC/NOS 10/15/2006   Dengue 02/22/2010   DVT, HX OF 10/15/2006   DYSAUTONOMIA 10/15/2006   Dysfunction of eustachian tube 05/26/2009   GI BLEEDING 04/04/2007   Glossitis 03/06/2008   HEADACHE, CHRONIC 04/04/2007   Heart murmur    Hematemesis 10/25/2007   HEMORRHOIDS 04/04/2007   HYPERLIPIDEMIA 12/12/2007   HYPERTENSION 10/15/2006   Hypertrophic cardiomyopathy (HCC)    HYPOTHYROIDISM 03/06/2008   IBS 04/04/2007   MASTECTOMY, BILATERAL, HX OF 08/12/2009   NECK MASS 07/01/2007   NEOP, MALIGNANT, FEMALE BREAST NOS 10/15/2006   NEOP, MALIGNANT, 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   Ovarian cancer (HCC)    Presence of cardiac pacemaker 04/25/2016   Red blood cell antibody positive    K   Seizures (HCC)    Subdural hematoma (HCC)    SYNCOPE 07/01/2007   Wheezing 08/20/2008   WOUND, OPEN, LEG, WITHOUT COMPLICATION 08/16/2007   Past Surgical History: Past Surgical History:  Procedure Laterality Date   ABDOMINAL HYSTERECTOMY     APPENDECTOMY     BREAST RECONSTRUCTION  CARDIAC ELECTROPHYSIOLOGY MAPPING AND ABLATION     CESAREAN SECTION     CHOLECYSTECTOMY     COLON RESECTION  2005   COLON SURGERY  06/2020   COLONOSCOPY W/ BIOPSIES     FLEXIBLE SIGMOIDOSCOPY     LEFT AND RIGHT HEART CATHETERIZATION WITH CORONARY ANGIOGRAM N/A 10/07/2012   Procedure: LEFT AND RIGHT HEART CATHETERIZATION WITH CORONARY ANGIOGRAM;  Surgeon: Dolores Patty, MD;  Location: [facility] CATH LAB;  Service: Cardiovascular;  Laterality: N/A;   MASTECTOMY Bilateral    MINIMALLY INVASIVE TRICUSPID VALVE  REPAIR     MITRAL VALVE REPAIR  2017   MITRAL VALVE REPLACEMENT     OOPHORECTOMY     SPIGELIAN HERNIA      Family History: Family History  Problem Relation Age of Onset   Heart disease Mother    Uterine cancer Mother    Ovarian cancer Sister    Pancreatic cancer Father    Melanoma Father    Diabetes Son     Social History:  Pets: dog Tobacco use/exposure: none   Medication List:  Allergies as of 03/09/2023       Reactions   Fish Allergy Anaphylaxis, Swelling   Scaled fish, like flounder   Penicillins Anaphylaxis, Hives, Rash   Levofloxacin In D5w Nausea And Vomiting, Swelling   Causes throat swelling   Nitroglycerin Other (See Comments)   Loss of consciousness   Pneumovax [pneumococcal Polysaccharide Vaccine] Other (See Comments)   Rash, arm swelling   Sulfamethoxazole    Other reaction(s): GI bleeding Other reaction(s): Blood Disorder   Latex Dermatitis, Rash   If worn for more than a day, this causes blisters        Medication List        Accurate as of March 09, 2023  4:07 PM. If you have any questions, ask your nurse or doctor.          STOP taking these medications    predniSONE 10 MG tablet Commonly known as: DELTASONE Stopped by: Birder Robson       TAKE these medications    acetaminophen-codeine 300-30 MG tablet Commonly known as: TYLENOL #3 TAKE 1 TABLET BY MOUTH EVERY 6 HOURS AS NEEDED   albuterol 108 (90 Base) MCG/ACT inhaler Commonly known as: VENTOLIN HFA Inhale 2 puffs into the lungs every 6 (six) hours as needed for wheezing or shortness of breath.   ascorbic acid 500 MG tablet Commonly known as: VITAMIN C Take 500 mg by mouth daily.   EPINEPHrine 0.3 mg/0.3 mL Soaj injection Commonly known as: EpiPen 2-Pak Inject 0.3 mg into the muscle as needed for anaphylaxis.   furosemide 20 MG tablet Commonly known as: LASIX Take 20 mg by mouth daily.   levothyroxine 50 MCG tablet Commonly known as: SYNTHROID Take 1 tablet (50  mcg total) by mouth daily. TAKE 1 TABLET(50 MCG) BY MOUTH EVERY DAY Strength: 50 mcg   metoprolol succinate 50 MG 24 hr tablet Commonly known as: TOPROL-XL Take 50 mg by mouth daily.   ondansetron 4 MG tablet Commonly known as: Zofran Take 1 tablet (4 mg total) by mouth every 8 (eight) hours as needed for nausea or vomiting.   ondansetron 8 MG disintegrating tablet Commonly known as: ZOFRAN-ODT Take 1 tablet (8 mg total) by mouth every 8 (eight) hours as needed for nausea.   venlafaxine XR 150 MG 24 hr capsule Commonly known as: EFFEXOR-XR Take 1 capsule (150 mg total) by mouth daily.   VITAMIN D PO  Take 1 tablet by mouth daily.   warfarin 5 MG tablet Commonly known as: COUMADIN Take 5-10 mg by mouth daily. Take 5mg  by mouth on SAT MON WED FRI in the evening, then take 10mg  on SUN TUE THU.   warfarin 10 MG tablet Commonly known as: Coumadin Take 1 tablet daily or as directed by anticoagulation clinic   ZINC PO Take 1 capsule by mouth daily.   zolpidem 10 MG tablet Commonly known as: AMBIEN TAKE 1 TABLET BY MOUTH EVERY DAY AT BEDTIME AS NEEDED   zonisamide 100 MG capsule Commonly known as: ZONEGRAN TAKE 4 CAPSULES BY MOUTH EVERY NIGHT         REVIEW OF SYSTEMS: Pertinent positives and negatives discussed in HPI.   Objective:   Physical Exam: BP 130/84 (BP Location: Right Arm, Cuff Size: Large)   Pulse 69   Temp (!) 97.5 F (36.4 C) (Temporal)   Resp 18   Ht 5\' 9"  (1.753 m)   Wt 224 lb 12.8 oz (102 kg)   SpO2 96%   BMI 33.20 kg/m  Body mass index is 33.2 kg/m. GEN: alert, well developed HEENT: clear conjunctiva, MMM HEART: regular rate and rhythm LUNGS: clear to auscultation bilaterally, no coughing, unlabored respiration ABDOMEN: soft, non distended  SKIN: no rashes or lesions  Assessment:   1. Idiopathic urticaria   2. Anaphylactic shock due to shellfish, initial encounter   3. Anaphylactic shock due to fish, initial encounter      Plan/Recommendations:   Allergic Reactions - Discussed possibly this is idiopathic urticaria/angioedema rather than anaphylaxis. Did discuss this is generally not a food driven process.  - Low reactivity on blood testing for foods (almond, wheat, peanut) are likely irrelevant/false positives and not even truly positive (<0.35).  I am not convinced these are triggers; okay to consume these foods.   - At this time etiology of hives and swelling is unknown. Hives can be caused by a variety of different triggers including illness/infection, pressure, vibrations, extremes of temperature to name a few however majority of the time there is no identifiable trigger.  -We will obtain labs to rule out inflammatory/autoimmune/alpha gal/mast cell causes. -Start Zyrtec 10mg  daily.   -If no improvement in 2-3 days, increase to Zyrtec 10mg  twice daily.   -If no improvement in 2-3 days, add Pepcid 20mg  twice daily and continue Zyrtec 10mg  twice daily.   Food Allergy:  - please strictly avoid shellfish and fish.  - for SKIN only reaction, okay to take Benadryl 25mg  capsules every 6 hours as needed - for SKIN + ANY additional symptoms, OR IF concern for LIFE THREATENING reaction = Epipen Autoinjector EpiPen 0.3 mg. - If using Epinephrine autoinjector, call 911     Return in about 4 weeks (around 04/06/2023).  Alesia Morin, MD [facility]

## 2023-03-20 LAB — ALLERGEN PROFILE, FOOD-FISH
Allergen Mackerel IgE: <0.1 kU/L
Allergen Salmon IgE: <0.1 kU/L
Allergen Trout IgE: <0.1 kU/L
Allergen Walley Pike IgE: <0.1 kU/L
Codfish IgE: <0.1 kU/L
Halibut IgE: <0.1 kU/L
Tuna: <0.1 kU/L

## 2023-03-20 LAB — ALLERGEN PROFILE, SHELLFISH
Clam IgE: <0.1 kU/L
F023-IgE Crab: <0.1 kU/L
F080-IgE Lobster: <0.1 kU/L
F290-IgE Oyster: <0.1 kU/L
Scallop IgE: <0.1 kU/L
Shrimp IgE: <0.1 kU/L

## 2023-03-20 LAB — CHRONIC URTICARIA: cu index: 1.9 (ref <10)

## 2023-03-20 LAB — ALPHA-GAL PANEL
Allergen Lamb IgE: <0.1 kU/L
Beef IgE: <0.1 kU/L
IgE (Immunoglobulin E), Serum: 3 [IU]/mL — ABNORMAL LOW (ref 6–495)
O215-IgE Alpha-Gal: <0.1 kU/L
Pork IgE: <0.1 kU/L

## 2023-03-20 LAB — C1 ESTERASE INHIBITOR, FUNCTIONAL: C1INH Functional/C1INH Total MFr SerPl: 102 %{normal}

## 2023-03-20 LAB — TSH+FREE T4
Free T4: 0.76 ng/dL — ABNORMAL LOW (ref 0.82–1.77)
TSH: 5.44 u[IU]/mL — ABNORMAL HIGH (ref 0.450–4.500)

## 2023-03-20 LAB — TRYPTASE: Tryptase: 7.6 ug/L (ref 2.2–13.2)

## 2023-03-20 LAB — ANA W/REFLEX: Anti Nuclear Antibody (ANA): NEGATIVE

## 2023-03-21 ENCOUNTER — Telehealth: Payer: Self-pay

## 2023-03-21 NOTE — Telephone Encounter (Signed)
 Called patient---DOB verified---informed her of lab results. Verbalized understanding. No further questions or concerns.

## 2023-03-26 ENCOUNTER — Encounter: Payer: Self-pay | Admitting: Internal Medicine

## 2023-03-26 ENCOUNTER — Ambulatory Visit (INDEPENDENT_AMBULATORY_CARE_PROVIDER_SITE_OTHER): Admitting: Internal Medicine

## 2023-03-26 ENCOUNTER — Other Ambulatory Visit: Payer: Self-pay | Admitting: Internal Medicine

## 2023-03-26 VITALS — BP 126/80 | HR 60 | Temp 98.4°F | Ht 69.0 in

## 2023-03-26 DIAGNOSIS — E039 Hypothyroidism, unspecified: Secondary | ICD-10-CM | POA: Diagnosis not present

## 2023-03-26 DIAGNOSIS — I10 Essential (primary) hypertension: Secondary | ICD-10-CM | POA: Diagnosis not present

## 2023-03-26 DIAGNOSIS — E785 Hyperlipidemia, unspecified: Secondary | ICD-10-CM

## 2023-03-26 DIAGNOSIS — E559 Vitamin D deficiency, unspecified: Secondary | ICD-10-CM

## 2023-03-26 DIAGNOSIS — H9201 Otalgia, right ear: Secondary | ICD-10-CM | POA: Insufficient documentation

## 2023-03-26 MED ORDER — LEVOTHYROXINE SODIUM 75 MCG PO TABS: 75.0000 ug | ORAL_TABLET | Freq: Every day | ORAL | 3 refills | Status: DC

## 2023-03-26 MED ORDER — CEFUROXIME AXETIL 250 MG PO TABS
250.0000 mg | ORAL_TABLET | Freq: Two times a day (BID) | ORAL | 1 refills | Status: DC
Start: 2023-03-26 — End: 2023-08-01

## 2023-03-26 NOTE — Assessment & Plan Note (Signed)
 BP Readings from Last 3 Encounters:  03/26/23 126/80  03/09/23 130/84  02/09/23 128/76   Stable, pt to continue medical treatment toprol xl 50 qd

## 2023-03-26 NOTE — Assessment & Plan Note (Signed)
 Mild to mod, for antibx course ceftin bid,  to f/u any worsening symptoms or concerns

## 2023-03-26 NOTE — Assessment & Plan Note (Signed)
 Lab Results  Component Value Date   LDLCALC 73 02/11/2008   Uncontrolled,, pt for lower chol diet, declines statin

## 2023-03-26 NOTE — Assessment & Plan Note (Signed)
Last vitamin D Lab Results  Component Value Date   VD25OH 24.44 (L) 06/29/2022   Low, to start oral replacement

## 2023-03-26 NOTE — Patient Instructions (Signed)
 Please take all new medication as prescribed - the antibiotic  Ok to increase the levothyroxine to 75 mcg per day  Please continue all other medications as before, and refills have been done if requested.  Please have the pharmacy call with any other refills you may need.  Please continue your efforts at being more active, low cholesterol diet, and weight control.  Please keep your appointments with your specialists as you may have planned

## 2023-03-26 NOTE — Progress Notes (Signed)
 Patient ID: [name], female   DOB: February 07, 1955, 68 y.o.   MRN: [id number]        Chief Complaint: follow up recurring right ear pain, htn, low vit d, hld       HPI:  [name] is a 68 y.o. female here with c/o 3 days osnet right ear pain with clogged feeling.  Pt denies chest pain, increased sob or doe, wheezing, orthopnea, PND, increased LE swelling, palpitations, dizziness or syncope.    Pt denies polydipsia, polyuria, or new focal neuro s/s.         Wt Readings from Last 3 Encounters:  03/09/23 224 lb 12.8 oz (102 kg)  11/06/22 205 lb (93 kg)  12/28/21 205 lb (93 kg)   BP Readings from Last 3 Encounters:  03/26/23 126/80  03/09/23 130/84  02/09/23 128/76         Past Medical History:  Diagnosis Date   Abdominal pain, epigastric 10/25/2007   ADD 01/20/2009   AKI (acute kidney injury) (HCC) 04/11/2016   ALLERGIC RHINITIS 10/15/2006   ANGIOEDEMA 08/12/2008   ANXIETY 10/15/2006   ASTHMATIC BRONCHITIS, ACUTE 01/27/2008   Bowel obstruction (HCC)    Breast cancer (HCC)    Cellulitis and abscess of leg, except foot 08/12/2007   CELLULITIS, FACE 12/21/2008   CHB (complete heart block) (HCC) 04/14/2016   CHEST PAIN 06/14/2007   Chronic anticoagulation 05/16/2016   Chronic sinus infection 04/12/2010   COLECTOMY, PARTIAL, WITH ANASTOMOSIS, HX OF 10/15/2006   COLONIC POLYPS, HX OF 10/15/2006   CYST, OVARIAN NEC/NOS 10/15/2006   Dengue 02/22/2010   DVT, HX OF 10/15/2006   DYSAUTONOMIA 10/15/2006   Dysfunction of eustachian tube 05/26/2009   GI BLEEDING 04/04/2007   Glossitis 03/06/2008   HEADACHE, CHRONIC 04/04/2007   Heart murmur    Hematemesis 10/25/2007   HEMORRHOIDS 04/04/2007   HYPERLIPIDEMIA 12/12/2007   HYPERTENSION 10/15/2006   Hypertrophic cardiomyopathy (HCC)    HYPOTHYROIDISM 03/06/2008   IBS 04/04/2007   MASTECTOMY, BILATERAL, HX OF 08/12/2009   NECK MASS 07/01/2007   NEOP, MALIGNANT, FEMALE BREAST NOS 10/15/2006   NEOP, MALIGNANT, 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   Ovarian cancer (HCC)     Presence of cardiac pacemaker 04/25/2016   Red blood cell antibody positive    K   Seizures (HCC)    Subdural hematoma (HCC)    SYNCOPE 07/01/2007   Wheezing 08/20/2008   WOUND, OPEN, LEG, WITHOUT COMPLICATION 08/16/2007   Past Surgical History:  Procedure Laterality Date   ABDOMINAL HYSTERECTOMY     APPENDECTOMY     BREAST RECONSTRUCTION     CARDIAC ELECTROPHYSIOLOGY MAPPING AND ABLATION     CESAREAN SECTION     CHOLECYSTECTOMY     COLON RESECTION  2005   COLON SURGERY  06/2020   COLONOSCOPY W/ BIOPSIES     FLEXIBLE SIGMOIDOSCOPY     LEFT AND RIGHT HEART CATHETERIZATION WITH CORONARY ANGIOGRAM N/A 10/07/2012   Procedure: LEFT AND RIGHT HEART CATHETERIZATION WITH CORONARY ANGIOGRAM;  Surgeon: Dolores Patty, MD;  Location: [facility] CATH LAB;  Service: Cardiovascular;  Laterality: N/A;   MASTECTOMY Bilateral    MINIMALLY INVASIVE TRICUSPID VALVE REPAIR     MITRAL VALVE REPAIR  2017   MITRAL VALVE REPLACEMENT     OOPHORECTOMY     SPIGELIAN HERNIA      reports that she has never smoked. She has never been exposed to tobacco smoke. She has never used smokeless tobacco. She reports that she does not currently use alcohol.  She reports that she does not use drugs. family history includes Diabetes in her son; Heart disease in her mother; Melanoma in her father; Ovarian cancer in her sister; Pancreatic cancer in her father; Uterine cancer in her mother. Allergies  Allergen Reactions   Fish Allergy Anaphylaxis and Swelling    Scaled fish, like flounder   Penicillins Anaphylaxis, Hives and Rash   Levofloxacin In D5w Nausea And Vomiting and Swelling    Causes throat swelling   Nitroglycerin Other (See Comments)    Loss of consciousness    Pneumovax [Pneumococcal Polysaccharide Vaccine] Other (See Comments)    Rash, arm swelling   Sulfamethoxazole     Other reaction(s): GI bleeding Other reaction(s): Blood Disorder   Latex Dermatitis and Rash    If worn for more than a day, this  causes blisters    Current Outpatient Medications on File Prior to Visit  Medication Sig Dispense Refill   acetaminophen-codeine (TYLENOL #3) 300-30 MG tablet TAKE 1 TABLET BY MOUTH EVERY 6 HOURS AS NEEDED 120 tablet 0   albuterol (VENTOLIN HFA) 108 (90 Base) MCG/ACT inhaler Inhale 2 puffs into the lungs every 6 (six) hours as needed for wheezing or shortness of breath. 8 g 11   EPINEPHrine (EPIPEN 2-PAK) 0.3 mg/0.3 mL IJ SOAJ injection Inject 0.3 mg into the muscle as needed for anaphylaxis. 1 each 2   furosemide (LASIX) 20 MG tablet Take 20 mg by mouth daily.     metoprolol succinate (TOPROL-XL) 50 MG 24 hr tablet Take 50 mg by mouth daily.     Multiple Vitamins-Minerals (ZINC PO) Take 1 capsule by mouth daily.     ondansetron (ZOFRAN) 4 MG tablet Take 1 tablet (4 mg total) by mouth every 8 (eight) hours as needed for nausea or vomiting. 30 tablet 0   ondansetron (ZOFRAN-ODT) 8 MG disintegrating tablet Take 1 tablet (8 mg total) by mouth every 8 (eight) hours as needed for nausea. 30 tablet 0   venlafaxine XR (EFFEXOR-XR) 150 MG 24 hr capsule TAKE 1 CAPSULE(150 MG) BY MOUTH DAILY 90 capsule 3   vitamin C (ASCORBIC ACID) 500 MG tablet Take 500 mg by mouth daily.      VITAMIN D PO Take 1 tablet by mouth daily.     warfarin (COUMADIN) 10 MG tablet Take 1 tablet daily or as directed by anticoagulation clinic 90 tablet 0   warfarin (COUMADIN) 5 MG tablet Take 5-10 mg by mouth daily. Take 5mg  by mouth on SAT MON WED FRI in the evening, then take 10mg  on SUN TUE THU.     zolpidem (AMBIEN) 10 MG tablet TAKE 1 TABLET BY MOUTH EVERY DAY AT BEDTIME AS NEEDED 90 tablet 1   zonisamide (ZONEGRAN) 100 MG capsule TAKE 4 CAPSULES BY MOUTH EVERY NIGHT 360 capsule 2   No current facility-administered medications on file prior to visit.        ROS:  All others reviewed and negative.  Objective        PE:  BP 126/80 (BP Location: Right Arm, Patient Position: Sitting, Cuff Size: Normal)   Pulse 60   Temp  98.4 F (36.9 C) (Oral)   Ht 5\' 9"  (1.753 m)   SpO2 98%   BMI 33.20 kg/m                 Constitutional: Pt appears in NAD               HENT: Head: NCAT.  Right Ear: External ear normal.  Right TM moderate erythema, effusion               Left Ear: External ear normal.                Eyes: . Pupils are equal, round, and reactive to light. Conjunctivae and EOM are normal               Nose: without d/c or deformity               Neck: Neck supple. Gross normal ROM               Cardiovascular: Normal rate and regular rhythm.                 Pulmonary/Chest: Effort normal and breath sounds without rales or wheezing.                               Neurological: Pt is alert. At baseline orientation, motor grossly intact               Skin: Skin is warm. No rashes, no other new lesions, LE edema - none               Psychiatric: Pt behavior is normal without agitation   Micro: none  Cardiac tracings I have personally interpreted today:  none  Pertinent Radiological findings (summarize): none   Lab Results  Component Value Date   WBC 9.3 02/09/2023   HGB 13.9 02/09/2023   HCT 40.7 02/09/2023   PLT 319.0 02/09/2023   GLUCOSE 105 (H) 02/09/2023   CHOL 219 (H) 06/29/2022   TRIG 320.0 (H) 06/29/2022   HDL 41.00 06/29/2022   LDLDIRECT 141.0 06/29/2022   LDLCALC 73 02/11/2008   ALT 20 06/29/2022   AST 20 06/29/2022   NA 142 02/09/2023   K 3.7 02/09/2023   CL 107 02/09/2023   CREATININE 0.97 02/09/2023   BUN 17 02/09/2023   CO2 26 02/09/2023   TSH 5.440 (H) 03/09/2023   INR 2.5 (H) 04/29/2021   HGBA1C 5.3 06/29/2022   Assessment/Plan:  KAMALJIT HIZER is a 68 y.o. White or Caucasian [1] female with  has a past medical history of Abdominal pain, epigastric (10/25/2007), ADD (01/20/2009), AKI (acute kidney injury) (HCC) (04/11/2016), ALLERGIC RHINITIS (10/15/2006), ANGIOEDEMA (08/12/2008), ANXIETY (10/15/2006), ASTHMATIC BRONCHITIS, ACUTE (01/27/2008), Bowel obstruction  (HCC), Breast cancer (HCC), Cellulitis and abscess of leg, except foot (08/12/2007), CELLULITIS, FACE (12/21/2008), CHB (complete heart block) (HCC) (04/14/2016), CHEST PAIN (06/14/2007), Chronic anticoagulation (05/16/2016), Chronic sinus infection (04/12/2010), COLECTOMY, PARTIAL, WITH ANASTOMOSIS, HX OF (10/15/2006), COLONIC POLYPS, HX OF (10/15/2006), CYST, OVARIAN NEC/NOS (10/15/2006), Dengue (02/22/2010), DVT, HX OF (10/15/2006), DYSAUTONOMIA (10/15/2006), Dysfunction of eustachian tube (05/26/2009), GI BLEEDING (04/04/2007), Glossitis (03/06/2008), HEADACHE, CHRONIC (04/04/2007), Heart murmur, Hematemesis (10/25/2007), HEMORRHOIDS (04/04/2007), HYPERLIPIDEMIA (12/12/2007), HYPERTENSION (10/15/2006), Hypertrophic cardiomyopathy (HCC), HYPOTHYROIDISM (03/06/2008), IBS (04/04/2007), MASTECTOMY, BILATERAL, HX OF (08/12/2009), NECK MASS (07/01/2007), NEOP, MALIGNANT, FEMALE BREAST NOS (10/15/2006), NEOP, MALIGNANT, THYMUS (10/15/2006), Ovarian cancer (HCC), Presence of cardiac pacemaker (04/25/2016), Red blood cell antibody positive, Seizures (HCC), Subdural hematoma (HCC), SYNCOPE (07/01/2007), Wheezing (08/20/2008), and WOUND, OPEN, LEG, WITHOUT COMPLICATION (08/16/2007).  Vitamin D deficiency Last vitamin D Lab Results  Component Value Date   VD25OH 24.44 (L) 06/29/2022   Low, to start oral replacement   Right ear pain Mild to mod, for antibx course ceftin bid,  to f/u any worsening symptoms or  concerns  HLD (hyperlipidemia) Lab Results  Component Value Date   LDLCALC 73 02/11/2008   Uncontrolled,, pt for lower chol diet, declines statin   Essential hypertension BP Readings from Last 3 Encounters:  03/26/23 126/80  03/09/23 130/84  02/09/23 128/76   Stable, pt to continue medical treatment toprol xl 50 qd   Hypothyroidism Lab Results  Component Value Date   TSH 5.440 (H) 03/09/2023   uncontrolled, pt to increase levothryoxine to 75 mcg qd  Followup: Return if symptoms worsen or fail to  improve.  Oliver Barre, MD 03/26/2023 8:56 PM [facility]

## 2023-03-26 NOTE — Assessment & Plan Note (Signed)
 Lab Results  Component Value Date   TSH 5.440 (H) 03/09/2023   uncontrolled, pt to increase levothryoxine to 75 mcg qd

## 2023-04-02 ENCOUNTER — Other Ambulatory Visit: Payer: Self-pay | Admitting: Internal Medicine

## 2023-04-02 MED ORDER — ACETAMINOPHEN-CODEINE 300-30 MG PO TABS: 1.0000 | ORAL_TABLET | Freq: Four times a day (QID) | ORAL | 0 refills | Status: DC | PRN

## 2023-04-02 NOTE — Telephone Encounter (Signed)
 Copied from CRM [id number]. Topic: Clinical - Medication Refill >> Apr 02, 2023 11:54 AM Ferdie Ping wrote: Most Recent Primary Care Visit:  Provider: Corwin Levins  Department: [facility]  Visit Type: ACUTE  Date: 03/26/2023  Medication: acetaminophen-codeine (TYLENOL #3) 300-30 MG tablet   Has the patient contacted their pharmacy? No (Agent: If no, request that the patient contact the pharmacy for the refill. If patient does not wish to contact the pharmacy document the reason why and proceed with request.) (Agent: If yes, when and what did the pharmacy advise?)  Is this the correct pharmacy for this prescription? Yes If no, delete pharmacy and type the correct one.  This is the patient's preferred pharmacy:  [facility] [address] [place], [place] - [address] AT [facility] [address] [place] [address] Phone: [phone] Fax: [phone]   Has the prescription been filled recently? No  Is the patient out of the medication? Yes  Has the patient been seen for an appointment in the last year OR does the patient have an upcoming appointment? Yes  Can we respond through MyChart? Yes  Agent: Please be advised that Rx refills may take up to 3 business days. We ask that you follow-up with your pharmacy.

## 2023-04-06 ENCOUNTER — Encounter: Payer: Self-pay | Admitting: Internal Medicine

## 2023-04-06 ENCOUNTER — Ambulatory Visit: Admitting: Internal Medicine

## 2023-04-06 ENCOUNTER — Other Ambulatory Visit: Payer: Self-pay

## 2023-04-06 VITALS — BP 138/84 | HR 60 | Temp 98.0°F | Resp 16 | Ht 69.0 in | Wt 224.0 lb

## 2023-04-06 DIAGNOSIS — T7800XA Anaphylactic reaction due to unspecified food, initial encounter: Secondary | ICD-10-CM

## 2023-04-06 DIAGNOSIS — T7800XD Anaphylactic reaction due to unspecified food, subsequent encounter: Secondary | ICD-10-CM | POA: Diagnosis not present

## 2023-04-06 DIAGNOSIS — L501 Idiopathic urticaria: Secondary | ICD-10-CM

## 2023-04-06 MED ORDER — FAMOTIDINE 20 MG PO TABS: 20.0000 mg | ORAL_TABLET | Freq: Two times a day (BID) | ORAL | 5 refills | Status: DC

## 2023-04-06 MED ORDER — CETIRIZINE HCL 10 MG PO TABS: 10.0000 mg | ORAL_TABLET | Freq: Two times a day (BID) | ORAL | 5 refills | Status: DC

## 2023-04-06 NOTE — Patient Instructions (Addendum)
 Allergic Reactions - Discussed this may be idiopathic urticaria/angioedema rather than anaphylaxis.  - At this time etiology of hives and swelling is unknown. Hives can be caused by a variety of different triggers including illness/infection, pressure, vibrations, extremes of temperature to name a few however majority of the time there is no identifiable trigger.  -Start Pepcid 20mg  twice daily and continue Zyrtec 10mg  twice daily.  If no swelling or hives for 2 weeks, can try stopping Pepcid and continue Zyrtec 10mg  twice daily.  If still no swelling or hives for another 2 weeks, can decrease to Zyrtec 10mg  daily.      Food Allergy:  - please strictly avoid shellfish and fish.  - for SKIN only reaction, okay to take Benadryl 25mg  capsules every 6 hours as needed - for SKIN + ANY additional symptoms, OR IF concern for LIFE THREATENING reaction = Epipen Autoinjector EpiPen 0.3 mg. - If using Epinephrine autoinjector, call 911

## 2023-04-06 NOTE — Progress Notes (Signed)
 FOLLOW UP Date of Service/Encounter:  04/06/23   Subjective:  [name] (DOB: 31-Jan-1955) is a 68 y.o. female who [facility] for follow up for allergic reactions and food allergy.   History obtained from: chart review and patient. Last visit was with me on 03/09/2023 after having recurrent hives/facial swelling.  Discussed possibly this is idiopathic urticaria/angioedema rather than anaphylaxis.  Started on Zyrtec/Pepcid regimen. Also with hx of seafood allergy.    Since last visit, reports no further hives but has had some tongue swelling episodes where she feels like her tongue is "bubbly".    No trouble breathing or swallowing.  No new food ingestion.  Taking Zyrtec daily.  Also notes having episodes where she feels like she can't breath, seeing a "kaleidoscope effect" then hyperventilates and passes out.   Past Medical History: Past Medical History:  Diagnosis Date   Abdominal pain, epigastric 10/25/2007   ADD 01/20/2009   AKI (acute kidney injury) (HCC) 04/11/2016   ALLERGIC RHINITIS 10/15/2006   ANGIOEDEMA 08/12/2008   ANXIETY 10/15/2006   ASTHMATIC BRONCHITIS, ACUTE 01/27/2008   Bowel obstruction (HCC)    Breast cancer (HCC)    Cellulitis and abscess of leg, except foot 08/12/2007   CELLULITIS, FACE 12/21/2008   CHB (complete heart block) (HCC) 04/14/2016   CHEST PAIN 06/14/2007   Chronic anticoagulation 05/16/2016   Chronic sinus infection 04/12/2010   COLECTOMY, PARTIAL, WITH ANASTOMOSIS, HX OF 10/15/2006   COLONIC POLYPS, HX OF 10/15/2006   CYST, OVARIAN NEC/NOS 10/15/2006   Dengue 02/22/2010   DVT, HX OF 10/15/2006   DYSAUTONOMIA 10/15/2006   Dysfunction of eustachian tube 05/26/2009   GI BLEEDING 04/04/2007   Glossitis 03/06/2008   HEADACHE, CHRONIC 04/04/2007   Heart murmur    Hematemesis 10/25/2007   HEMORRHOIDS 04/04/2007   HYPERLIPIDEMIA 12/12/2007   HYPERTENSION 10/15/2006   Hypertrophic cardiomyopathy (HCC)     HYPOTHYROIDISM 03/06/2008   IBS 04/04/2007   MASTECTOMY, BILATERAL, HX OF 08/12/2009   NECK MASS 07/01/2007   NEOP, MALIGNANT, FEMALE BREAST NOS 10/15/2006   NEOP, MALIGNANT, 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   Ovarian cancer (HCC)    Presence of cardiac pacemaker 04/25/2016   Red blood cell antibody positive    K   Seizures (HCC)    Subdural hematoma (HCC)    SYNCOPE 07/01/2007   Wheezing 08/20/2008   WOUND, OPEN, LEG, WITHOUT COMPLICATION 08/16/2007    Objective:  BP 138/84 (BP Location: Right Arm, Patient Position: Sitting, Cuff Size: Normal)   Pulse 60   Temp 98 F (36.7 C) (Temporal)   Resp 16   Ht 5\' 9"  (1.753 m)   Wt 224 lb (101.6 kg)   SpO2 97%   BMI 33.08 kg/m  Body mass index is 33.08 kg/m. Physical Exam: GEN: alert, well developed HEENT: clear conjunctiva, nose with mild inferior turbinate hypertrophy, pink nasal mucosa, clear rhinorrhea, + cobblestoning HEART: regular rate and rhythm, no murmur LUNGS: clear to auscultation bilaterally, no coughing, unlabored respiration SKIN: no rashes or lesions  Assessment:   1. Idiopathic urticaria   2. Allergy with anaphylaxis due to food     Plan/Recommendations:  Allergic Reactions - Discussed based on history this is likely idiopathic urticaria/angioedema rather than anaphylaxis. Hives have resolved but now with possibly angioedema of tongue?Will uptitrate her anti histamines. I also wonder if anxiety is contributing.  - At this time etiology of hives and swelling is unknown. Hives can be caused by a variety of different  triggers including illness/infection, pressure, vibrations, extremes of temperature to name a few however majority of the time there is no identifiable trigger.  - 03/2023: negative alpha gal, ANA, CU index; normal tryptase, slightly elevated TSH, her thyroid med dose adjusted by PCP.  -Start Pepcid 20mg  twice daily and continue Zyrtec 10mg  twice daily.     Food Allergy:  - please strictly avoid shellfish and fish.  -  sIgE 03/2023: negative to fish and shellfish.  - for SKIN only reaction, okay to take Benadryl 25mg  capsules every 6 hours as needed - for SKIN + ANY additional symptoms, OR IF concern for LIFE THREATENING reaction = Epipen Autoinjector EpiPen 0.3 mg. - If using Epinephrine autoinjector, call 911     Other Reactions - Discuss with PCP regarding episodes of hyperventilation/vision changes/LOC.     Return in about 3 months (around 07/06/2023).  Alesia Morin, MD [facility]

## 2023-05-03 ENCOUNTER — Other Ambulatory Visit: Payer: Self-pay | Admitting: Internal Medicine

## 2023-05-03 ENCOUNTER — Telehealth: Payer: Self-pay

## 2023-05-03 MED ORDER — ACETAMINOPHEN-CODEINE 300-30 MG PO TABS: 1.0000 | ORAL_TABLET | Freq: Four times a day (QID) | ORAL | 0 refills | Status: DC | PRN

## 2023-05-03 NOTE — Telephone Encounter (Signed)
 Copied from CRM [id number]. Topic: Clinical - Medication Question >> May 03, 2023 10:40 AM Bambi Bonine D wrote: Reason for CRM: Patient would like someone to contact or fax the pharmacy to get the medication approved for the acetaminophen -codeine  (TYLENOL  #3) 300-30 MG tablet. Patient stated that every time she request a medication refill the pharmacy's states that's its an issue with the provider office. Patient had medication refill submitted today and wanted to know if the medication can get sent to the pharmacy today as well. Patient stated that she needs the medication to eat.

## 2023-05-03 NOTE — Addendum Note (Signed)
 Addended by: Roslyn Coombe on: 05/03/2023 03:14 PM   Modules accepted: Orders

## 2023-05-03 NOTE — Telephone Encounter (Unsigned)
 Copied from CRM [id number]. Topic: Clinical - Medication Refill >> May 03, 2023 10:42 AM Bambi Bonine D wrote: Most Recent Primary Care Visit:  Provider: Roslyn Coombe  Department: [facility]  Visit Type: ACUTE  Date: 03/26/2023  Medication: acetaminophen -codeine  (TYLENOL  #3) 300-30 MG tablet    Has the patient contacted their pharmacy? Yes (Agent: If no, request that the patient contact the pharmacy for the refill. If patient does not wish to contact the pharmacy document the reason why and proceed with request.) (Agent: If yes, when and what did the pharmacy advise?)  Is this the correct pharmacy for this prescription? Yes If no, delete pharmacy and type the correct one.  This is the patient's preferred pharmacy:  [facility] [address] [place], [place] - [address] AT [facility] [address] [place] [address] Phone: [phone] Fax: [phone]    Has the prescription been filled recently? No  Is the patient out of the medication? Yes  Has the patient been seen for an appointment in the last year OR does the patient have an upcoming appointment? Yes  Can we respond through MyChart? Yes  Agent: Please be advised that Rx refills may take up to 3 business days. We ask that you follow-up with your pharmacy.

## 2023-05-03 NOTE — Telephone Encounter (Signed)
 Ok done erx

## 2023-06-01 ENCOUNTER — Other Ambulatory Visit: Payer: Self-pay | Admitting: Internal Medicine

## 2023-06-01 MED ORDER — ACETAMINOPHEN-CODEINE 300-30 MG PO TABS: 1.0000 | ORAL_TABLET | Freq: Four times a day (QID) | ORAL | 0 refills | Status: DC | PRN

## 2023-06-01 NOTE — Telephone Encounter (Signed)
 Copied from CRM [id number]. Topic: Clinical - Medication Refill >> Jun 01, 2023 12:33 PM Kita Perish H wrote: Medication: acetaminophen -codeine  (TYLENOL  #3) 300-30 MG tablet  Has the patient contacted their pharmacy? Yes, told to reach out to provider (Agent: If no, request that the patient contact the pharmacy for the refill. If patient does not wish to contact the pharmacy document the reason why and proceed with request.) (Agent: If yes, when and what did the pharmacy advise?)  This is the patient's preferred pharmacy:  [facility] [address] [place], [place] - [address] AT [facility] [address] [place] [address] Phone: [phone] Fax: [phone]    Is this the correct pharmacy for this prescription? Yes If no, delete pharmacy and type the correct one.   Has the prescription been filled recently? No  Is the patient out of the medication? Yes  Has the patient been seen for an appointment in the last year OR does the patient have an upcoming appointment? Yes  Can we respond through MyChart? Yes  Agent: Please be advised that Rx refills may take up to 3 business days. We ask that you follow-up with your pharmacy.

## 2023-06-06 ENCOUNTER — Ambulatory Visit (INDEPENDENT_AMBULATORY_CARE_PROVIDER_SITE_OTHER)

## 2023-06-06 ENCOUNTER — Ambulatory Visit (INDEPENDENT_AMBULATORY_CARE_PROVIDER_SITE_OTHER): Admitting: Internal Medicine

## 2023-06-06 ENCOUNTER — Encounter: Payer: Self-pay | Admitting: Internal Medicine

## 2023-06-06 VITALS — BP 144/86 | HR 59 | Temp 97.9°F | Ht 69.0 in

## 2023-06-06 DIAGNOSIS — R062 Wheezing: Secondary | ICD-10-CM | POA: Diagnosis not present

## 2023-06-06 DIAGNOSIS — R051 Acute cough: Secondary | ICD-10-CM

## 2023-06-06 DIAGNOSIS — I10 Essential (primary) hypertension: Secondary | ICD-10-CM

## 2023-06-06 DIAGNOSIS — E559 Vitamin D deficiency, unspecified: Secondary | ICD-10-CM | POA: Diagnosis not present

## 2023-06-06 MED ORDER — PREDNISONE 10 MG PO TABS: ORAL_TABLET | ORAL | 0 refills | Status: DC

## 2023-06-06 MED ORDER — CEFDINIR 300 MG PO CAPS: 300.0000 mg | ORAL_CAPSULE | Freq: Two times a day (BID) | ORAL | 0 refills | Status: DC

## 2023-06-06 MED ORDER — HYDROCODONE BIT-HOMATROP MBR 5-1.5 MG/5ML PO SOLN: 5.0000 mL | Freq: Four times a day (QID) | ORAL | 0 refills | Status: AC | PRN

## 2023-06-06 NOTE — Assessment & Plan Note (Signed)
Last vitamin D Lab Results  Component Value Date   VD25OH 24.44 (L) 06/29/2022   Low, to start oral replacement

## 2023-06-06 NOTE — Patient Instructions (Signed)
 Please take all new medication as prescribed - the antibiotic, cough medicine, and prednisone   Please continue all other medications as before, and refills have been done if requested.  Please have the pharmacy call with any other refills you may need.  Please keep your appointments with your specialists as you may have planned  Please go to the XRAY Department in the first floor for the x-ray testing  You will be contacted by phone if any changes need to be made immediately.  Otherwise, you will receive a letter about your results with an explanation, but please check with MyChart first.

## 2023-06-06 NOTE — Assessment & Plan Note (Signed)
 Mild to mod, for prednisone  taper, also for CXR,  to f/u any worsening symptoms or concerns

## 2023-06-06 NOTE — Assessment & Plan Note (Signed)
 Mild to mod, c/w bornchitis vs pna, for cxr, for omnicef  300 bid, cough med prn,  to f/u any worsening symptoms or concerns

## 2023-06-06 NOTE — Assessment & Plan Note (Signed)
 BP Readings from Last 3 Encounters:  06/06/23 (!) 144/86  04/06/23 138/84  03/26/23 126/80   Mild uncontrolled, likely reactive, pt to continue medical treatment toprol  xl 50 every day as declines change

## 2023-06-06 NOTE — Progress Notes (Signed)
 Patient ID: [name], female   DOB: [birth year], 68 y.o.   MRN: [id number]        Chief Complaint: follow up cough, wheezing sob, htn       HPI:  [name] is a 68 y.o. female Here with acute onset mild to mod 2-3 days ST, HA, general weakness and malaise, with prod cough greenish sputum, but Pt denies chest pain, increased sob or doe, wheezing, orthopnea, PND, increased LE swelling, palpitations, dizziness or syncope except for wheezing sob since last pm.   Pt denies polydipsia, polyuria, or new focal neuro s/s.          Wt Readings from Last 3 Encounters:  04/06/23 224 lb (101.6 kg)  03/09/23 224 lb 12.8 oz (102 kg)  11/06/22 205 lb (93 kg)   BP Readings from Last 3 Encounters:  06/06/23 (!) 144/86  04/06/23 138/84  03/26/23 126/80         Past Medical History:  Diagnosis Date   Abdominal pain, epigastric 10/25/2007   ADD 01/20/2009   AKI (acute kidney injury) (HCC) 04/11/2016   ALLERGIC RHINITIS 10/15/2006   ANGIOEDEMA 08/12/2008   ANXIETY 10/15/2006   ASTHMATIC BRONCHITIS, ACUTE 01/27/2008   Bowel obstruction (HCC)    Breast cancer (HCC)    Cellulitis and abscess of leg, except foot 08/12/2007   CELLULITIS, FACE 12/21/2008   CHB (complete heart block) (HCC) 04/14/2016   CHEST PAIN 06/14/2007   Chronic anticoagulation 05/16/2016   Chronic sinus infection 04/12/2010   COLECTOMY, PARTIAL, WITH ANASTOMOSIS, HX OF 10/15/2006   COLONIC POLYPS, HX OF 10/15/2006   CYST, OVARIAN NEC/NOS 10/15/2006   Dengue 02/22/2010   DVT, HX OF 10/15/2006   DYSAUTONOMIA 10/15/2006   Dysfunction of eustachian tube 05/26/2009   GI BLEEDING 04/04/2007   Glossitis 03/06/2008   HEADACHE, CHRONIC 04/04/2007   Heart murmur    Hematemesis 10/25/2007   HEMORRHOIDS 04/04/2007   HYPERLIPIDEMIA 12/12/2007   HYPERTENSION 10/15/2006   Hypertrophic cardiomyopathy (HCC)    HYPOTHYROIDISM 03/06/2008   IBS 04/04/2007   MASTECTOMY, BILATERAL, HX OF 08/12/2009   NECK MASS 07/01/2007   NEOP, MALIGNANT, FEMALE BREAST  NOS 10/15/2006   NEOP, MALIGNANT, 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   Ovarian cancer (HCC)    Presence of cardiac pacemaker 04/25/2016   Red blood cell antibody positive    K   Seizures (HCC)    Subdural hematoma (HCC)    SYNCOPE 07/01/2007   Wheezing 08/20/2008   WOUND, OPEN, LEG, WITHOUT COMPLICATION 08/16/2007   Past Surgical History:  Procedure Laterality Date   ABDOMINAL HYSTERECTOMY     APPENDECTOMY     BREAST RECONSTRUCTION     CARDIAC ELECTROPHYSIOLOGY MAPPING AND ABLATION     CESAREAN SECTION     CHOLECYSTECTOMY     COLON RESECTION  2005   COLON SURGERY  06/2020   COLONOSCOPY W/ BIOPSIES     FLEXIBLE SIGMOIDOSCOPY     LEFT AND RIGHT HEART CATHETERIZATION WITH CORONARY ANGIOGRAM N/A 10/07/2012   Procedure: LEFT AND RIGHT HEART CATHETERIZATION WITH CORONARY ANGIOGRAM;  Surgeon: Mardell Shade, MD;  Location: [facility] CATH LAB;  Service: Cardiovascular;  Laterality: N/A;   MASTECTOMY Bilateral    MINIMALLY INVASIVE TRICUSPID VALVE REPAIR     MITRAL VALVE REPAIR  2017   MITRAL VALVE REPLACEMENT     OOPHORECTOMY     SPIGELIAN HERNIA      reports that she has never smoked. She has never been exposed to tobacco smoke. She has never used  smokeless tobacco. She reports that she does not currently use alcohol. She reports that she does not use drugs. family history includes Diabetes in her son; Heart disease in her mother; Melanoma in her father; Ovarian cancer in her sister; Pancreatic cancer in her father; Uterine cancer in her mother. Allergies  Allergen Reactions   Fish Allergy  Anaphylaxis and Swelling    Scaled fish, like flounder   Penicillins Anaphylaxis, Hives and Rash   Levofloxacin  In D5w Nausea And Vomiting and Swelling    Causes throat swelling   Nitroglycerin  Other (See Comments)    Loss of consciousness    Pneumovax [Pneumococcal Polysaccharide Vaccine] Other (See Comments)    Rash, arm swelling   Sulfamethoxazole      Other reaction(s): GI bleeding Other reaction(s): Blood  Disorder   Latex Dermatitis and Rash    If worn for more than a day, this causes blisters    Current Outpatient Medications on File Prior to Visit  Medication Sig Dispense Refill   acetaminophen -codeine  (TYLENOL  #3) 300-30 MG tablet Take 1 tablet by mouth every 6 (six) hours as needed. 120 tablet 0   albuterol  (VENTOLIN  HFA) 108 (90 Base) MCG/ACT inhaler Inhale 2 puffs into the lungs every 6 (six) hours as needed for wheezing or shortness of breath. 8 g 11   cefUROXime  (CEFTIN ) 250 MG tablet Take 1 tablet (250 mg total) by mouth 2 (two) times daily with a meal. 20 tablet 1   cetirizine  (ZYRTEC  ALLERGY ) 10 MG tablet Take 1 tablet (10 mg total) by mouth 2 (two) times daily. 60 tablet 5   EPINEPHrine  (EPIPEN  2-PAK) 0.3 mg/0.3 mL IJ SOAJ injection Inject 0.3 mg into the muscle as needed for anaphylaxis. 1 each 2   famotidine  (PEPCID ) 20 MG tablet Take 1 tablet (20 mg total) by mouth 2 (two) times daily. 60 tablet 5   furosemide  (LASIX ) 20 MG tablet Take 20 mg by mouth daily.     levothyroxine  (SYNTHROID ) 75 MCG tablet Take 1 tablet (75 mcg total) by mouth daily. 90 tablet 3   metoprolol  succinate (TOPROL -XL) 50 MG 24 hr tablet Take 50 mg by mouth daily.     Multiple Vitamins-Minerals (ZINC PO) Take 1 capsule by mouth daily.     ondansetron  (ZOFRAN ) 4 MG tablet Take 1 tablet (4 mg total) by mouth every 8 (eight) hours as needed for nausea or vomiting. 30 tablet 0   ondansetron  (ZOFRAN -ODT) 8 MG disintegrating tablet Take 1 tablet (8 mg total) by mouth every 8 (eight) hours as needed for nausea. 30 tablet 0   venlafaxine  XR (EFFEXOR -XR) 150 MG 24 hr capsule TAKE 1 CAPSULE(150 MG) BY MOUTH DAILY 90 capsule 3   vitamin C (ASCORBIC ACID) 500 MG tablet Take 500 mg by mouth daily.      VITAMIN D  PO Take 1 tablet by mouth daily.     warfarin (COUMADIN ) 10 MG tablet Take 1 tablet daily or as directed by anticoagulation clinic 90 tablet 0   warfarin (COUMADIN ) 5 MG tablet Take 5-10 mg by mouth daily. Take  5mg  by mouth on SAT MON WED FRI in the evening, then take 10mg  on SUN TUE THU.     zolpidem  (AMBIEN ) 10 MG tablet TAKE 1 TABLET BY MOUTH EVERY DAY AT BEDTIME AS NEEDED 90 tablet 1   zonisamide  (ZONEGRAN ) 100 MG capsule TAKE 4 CAPSULES BY MOUTH EVERY NIGHT 360 capsule 2   No current facility-administered medications on file prior to visit.  ROS:  All others reviewed and negative.  Objective        PE:  BP (!) 144/86   Pulse (!) 59   Temp 97.9 F (36.6 C) (Temporal)   Ht 5\' 9"  (1.753 m)   SpO2 95%   BMI 33.08 kg/m                 Constitutional: Pt appears mild ill               HENT: Head: NCAT.                Right Ear: External ear normal.                 Left Ear: External ear normal. Bilat tm's with mild erythema.  Max sinus areas non tender.  Pharynx with mild erythema, no exudate               Eyes: . Pupils are equal, round, and reactive to light. Conjunctivae and EOM are normal               Nose: without d/c or deformity               Neck: Neck supple. Gross normal ROM               Cardiovascular: Normal rate and regular rhythm.                 Pulmonary/Chest: Effort normal and breath sounds decreased without rales or wheezing.                               Neurological: Pt is alert. At baseline orientation, motor grossly intact               Skin: Skin is warm. No rashes, no other new lesions, LE edema - none               Psychiatric: Pt behavior is normal without agitation   Micro: none  Cardiac tracings I have personally interpreted today:  none  Pertinent Radiological findings (summarize): none   Lab Results  Component Value Date   WBC 9.3 02/09/2023   HGB 13.9 02/09/2023   HCT 40.7 02/09/2023   PLT 319.0 02/09/2023   GLUCOSE 105 (H) 02/09/2023   CHOL 219 (H) 06/29/2022   TRIG 320.0 (H) 06/29/2022   HDL 41.00 06/29/2022   LDLDIRECT 141.0 06/29/2022   LDLCALC 73 02/11/2008   ALT 20 06/29/2022   AST 20 06/29/2022   NA 142 02/09/2023   K 3.7  02/09/2023   CL 107 02/09/2023   CREATININE 0.97 02/09/2023   BUN 17 02/09/2023   CO2 26 02/09/2023   TSH 5.440 (H) 03/09/2023   INR 2.5 (H) 04/29/2021   HGBA1C 5.3 06/29/2022   Assessment/Plan:  [name] is a 68 y.o. White or Caucasian [1] female with  has a past medical history of Abdominal pain, epigastric (10/25/2007), ADD (01/20/2009), AKI (acute kidney injury) (HCC) (04/11/2016), ALLERGIC RHINITIS (10/15/2006), ANGIOEDEMA (08/12/2008), ANXIETY (10/15/2006), ASTHMATIC BRONCHITIS, ACUTE (01/27/2008), Bowel obstruction (HCC), Breast cancer (HCC), Cellulitis and abscess of leg, except foot (08/12/2007), CELLULITIS, FACE (12/21/2008), CHB (complete heart block) (HCC) (04/14/2016), CHEST PAIN (06/14/2007), Chronic anticoagulation (05/16/2016), Chronic sinus infection (04/12/2010), COLECTOMY, PARTIAL, WITH ANASTOMOSIS, HX OF (10/15/2006), COLONIC POLYPS, HX OF (10/15/2006), CYST, OVARIAN NEC/NOS (10/15/2006), Dengue (02/22/2010), DVT, HX OF (10/15/2006), DYSAUTONOMIA (10/15/2006), Dysfunction of eustachian tube (05/26/2009), GI  BLEEDING (04/04/2007), Glossitis (03/06/2008), HEADACHE, CHRONIC (04/04/2007), Heart murmur, Hematemesis (10/25/2007), HEMORRHOIDS (04/04/2007), HYPERLIPIDEMIA (12/12/2007), HYPERTENSION (10/15/2006), Hypertrophic cardiomyopathy (HCC), HYPOTHYROIDISM (03/06/2008), IBS (04/04/2007), MASTECTOMY, BILATERAL, HX OF (08/12/2009), NECK MASS (07/01/2007), NEOP, MALIGNANT, FEMALE BREAST NOS (10/15/2006), NEOP, MALIGNANT, THYMUS (10/15/2006), Ovarian cancer (HCC), Presence of cardiac pacemaker (04/25/2016), Red blood cell antibody positive, Seizures (HCC), Subdural hematoma (HCC), SYNCOPE (07/01/2007), Wheezing (08/20/2008), and WOUND, OPEN, LEG, WITHOUT COMPLICATION (08/16/2007).  Wheezing Mild to mod, for prednisone  taper, also for CXR,  to f/u any worsening symptoms or concerns  Cough Mild to mod, c/w bornchitis vs pna, for cxr, for omnicef  300 bid, cough med prn,  to f/u any worsening symptoms or  concerns  Essential hypertension BP Readings from Last 3 Encounters:  06/06/23 (!) 144/86  04/06/23 138/84  03/26/23 126/80   Mild uncontrolled, likely reactive, pt to continue medical treatment toprol  xl 50 every day as declines change   Vitamin D  deficiency Last vitamin D  Lab Results  Component Value Date   VD25OH 24.44 (L) 06/29/2022   Low, to start oral replacement  Followup: Return if symptoms worsen or fail to improve.  Rosalia Colonel, MD 06/06/2023 8:44 PM [facility]

## 2023-06-10 ENCOUNTER — Ambulatory Visit: Payer: Self-pay | Admitting: Internal Medicine

## 2023-06-15 ENCOUNTER — Ambulatory Visit: Payer: Self-pay

## 2023-06-15 NOTE — Telephone Encounter (Signed)
 FYI Only or Action Required?: FYI only for provider  Patient was last seen in primary care on 06/06/2023 by Roslyn Coombe, MD. Called Nurse Triage reporting Chest Pain. Symptoms began a week ago. Interventions attempted: Nothing. Symptoms are: gradually worsening.  Triage Disposition: Go to ED Now (Notify PCP)  Patient/caregiver understands and will follow disposition?: Yes  Copied from CRM [id number]. Topic: Clinical - Red Word Triage >> Jun 15, 2023  2:08 PM Kita Perish H wrote: Red Word that prompted transfer to Nurse Triage: Patient was in to see Dr. Autry Legions on 6/4, xray was done and showed no issues but patient is still having chest pains that's not getting any better.  Reason for Disposition  Pain also in shoulder(s) or arm(s) or jaw  (Exception: Pain is clearly made worse by movement.)  Answer Assessment - Initial Assessment Questions 1. LOCATION: Where does it hurt?       Across her chest 2. RADIATION: Does the pain go anywhere else? (e.g., into neck, jaw, arms, back)     Left Side, Shoulder blade  3. ONSET: When did the chest pain begin? (Minutes, hours or days)      A week ago  4. PATTERN: Does the pain come and go, or has it been constant since it started?  Does it get worse with exertion?      Constant, Relieved by sitting up straight  5. DURATION: How long does it last (e.g., seconds, minutes, hours)     Unsure, constant  6. SEVERITY: How bad is the pain?  (e.g., Scale 1-10; mild, moderate, or severe)    - MILD (1-3): doesn't interfere with normal activities     - MODERATE (4-7): interferes with normal activities or awakens from sleep    - SEVERE (8-10): excruciating pain, unable to do any normal activities       Moderate  7. CARDIAC RISK FACTORS: Do you have any history of heart problems or risk factors for heart disease? (e.g., angina, prior heart attack; diabetes, high blood pressure, high cholesterol, smoker, or strong family history of heart disease)      Open Heart Surgeries, Valve Replacements  8. PULMONARY RISK FACTORS: Do you have any history of lung disease?  (e.g., blood clots in lung, asthma, emphysema, birth control pills)      No  9. CAUSE: What do you think is causing the chest pain?     Unsure  10. OTHER SYMPTOMS: Do you have any other symptoms? (e.g., dizziness, nausea, vomiting, sweating, fever, difficulty breathing, cough)      Nausea, Dizziness, Sweating, Coughing  11. PREGNANCY: Is there any chance you are pregnant? When was your last menstrual period?       No and No  Protocols used: Chest Pain-A-AH

## 2023-07-02 ENCOUNTER — Other Ambulatory Visit: Payer: Self-pay | Admitting: Internal Medicine

## 2023-07-02 MED ORDER — ACETAMINOPHEN-CODEINE 300-30 MG PO TABS: 1.0000 | ORAL_TABLET | Freq: Four times a day (QID) | ORAL | 0 refills | Status: DC | PRN

## 2023-07-02 NOTE — Telephone Encounter (Signed)
 Copied from CRM [id number]. Topic: Clinical - Medication Refill >> Jul 02, 2023 12:57 PM Drema MATSU wrote: Medication: acetaminophen -codeine  (TYLENOL  #3) 300-30 MG tablet  Has the patient contacted their pharmacy? Yes (Agent: If no, request that the patient contact the pharmacy for the refill. If patient does not wish to contact the pharmacy document the reason why and proceed with request.) (Agent: If yes, when and what did the pharmacy advise?) tried to contact doctor   This is the patient's preferred pharmacy:   [facility] [address] [place], [place] - [address] AT [facility] [address] [name] [address] Phone: [phone] Fax: [phone]   Is this the correct pharmacy for this prescription? Yes If no, delete pharmacy and type the correct one.   Has the prescription been filled recently? Yes  Is the patient out of the medication? Yes ran out this morning  Has the patient been seen for an appointment in the last year OR does the patient have an upcoming appointment? Yes  Can we respond through MyChart? Yes  Agent: Please be advised that Rx refills may take up to 3 business days. We ask that you follow-up with your pharmacy.

## 2023-07-11 ENCOUNTER — Ambulatory Visit: Admitting: Family

## 2023-08-01 ENCOUNTER — Encounter: Payer: Self-pay | Admitting: Internal Medicine

## 2023-08-01 ENCOUNTER — Ambulatory Visit (INDEPENDENT_AMBULATORY_CARE_PROVIDER_SITE_OTHER): Admitting: Internal Medicine

## 2023-08-01 VITALS — BP 136/92 | HR 60 | Ht 69.0 in

## 2023-08-01 DIAGNOSIS — E785 Hyperlipidemia, unspecified: Secondary | ICD-10-CM

## 2023-08-01 DIAGNOSIS — J069 Acute upper respiratory infection, unspecified: Secondary | ICD-10-CM | POA: Diagnosis not present

## 2023-08-01 DIAGNOSIS — E559 Vitamin D deficiency, unspecified: Secondary | ICD-10-CM | POA: Diagnosis not present

## 2023-08-01 DIAGNOSIS — I10 Essential (primary) hypertension: Secondary | ICD-10-CM

## 2023-08-01 MED ORDER — HYDROCODONE BIT-HOMATROP MBR 5-1.5 MG/5ML PO SOLN: 5.0000 mL | Freq: Four times a day (QID) | ORAL | 0 refills | Status: AC | PRN

## 2023-08-01 MED ORDER — CEFUROXIME AXETIL 250 MG PO TABS: 250.0000 mg | ORAL_TABLET | Freq: Two times a day (BID) | ORAL | 1 refills | Status: DC

## 2023-08-01 NOTE — Assessment & Plan Note (Signed)
 Mild to mod, for antibx course  ceftin  250 bid, cough med prn,  to f/u any worsening symptoms or concerns

## 2023-08-01 NOTE — Assessment & Plan Note (Signed)
 BP Readings from Last 3 Encounters:  08/01/23 (!) 136/92  06/06/23 (!) 144/86  04/06/23 138/84   Mild uncontrolled, likely reactive,, pt to continue medical treatment toprol  xl 50 qd

## 2023-08-01 NOTE — Progress Notes (Signed)
 Patient ID: [name], female   DOB: 01-19-1955, 68 y.o.   MRN: [id number]        Chief Complaint: follow up URI symptoms, , hld, low vit d       HPI:  [name] is a 68 y.o. female  Here with 2-3 days acute onset fever, facial pain, pressure, headache, general weakness and malaise, and greenish d/c, with mild ST and cough, but pt denies chest pain, wheezing, increased sob or doe, orthopnea, PND, increased LE swelling, palpitations, dizziness or syncope.   Pt denies polydipsia, polyuria, or new focal neuro s/s.           Wt Readings from Last 3 Encounters:  04/06/23 224 lb (101.6 kg)  03/09/23 224 lb 12.8 oz (102 kg)  11/06/22 205 lb (93 kg)   BP Readings from Last 3 Encounters:  08/01/23 (!) 136/92  06/06/23 (!) 144/86  04/06/23 138/84         Past Medical History:  Diagnosis Date   Abdominal pain, epigastric 10/25/2007   ADD 01/20/2009   AKI (acute kidney injury) (HCC) 04/11/2016   ALLERGIC RHINITIS 10/15/2006   ANGIOEDEMA 08/12/2008   ANXIETY 10/15/2006   ASTHMATIC BRONCHITIS, ACUTE 01/27/2008   Bowel obstruction (HCC)    Breast cancer (HCC)    Cellulitis and abscess of leg, except foot 08/12/2007   CELLULITIS, FACE 12/21/2008   CHB (complete heart block) (HCC) 04/14/2016   CHEST PAIN 06/14/2007   Chronic anticoagulation 05/16/2016   Chronic sinus infection 04/12/2010   COLECTOMY, PARTIAL, WITH ANASTOMOSIS, HX OF 10/15/2006   COLONIC POLYPS, HX OF 10/15/2006   CYST, OVARIAN NEC/NOS 10/15/2006   Dengue 02/22/2010   DVT, HX OF 10/15/2006   DYSAUTONOMIA 10/15/2006   Dysfunction of eustachian tube 05/26/2009   GI BLEEDING 04/04/2007   Glossitis 03/06/2008   HEADACHE, CHRONIC 04/04/2007   Heart murmur    Hematemesis 10/25/2007   HEMORRHOIDS 04/04/2007   HYPERLIPIDEMIA 12/12/2007   HYPERTENSION 10/15/2006   Hypertrophic cardiomyopathy (HCC)    HYPOTHYROIDISM 03/06/2008   IBS 04/04/2007   MASTECTOMY, BILATERAL, HX OF 08/12/2009   NECK MASS 07/01/2007   NEOP, MALIGNANT, FEMALE BREAST  NOS 10/15/2006   NEOP, MALIGNANT, 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   Ovarian cancer (HCC)    Presence of cardiac pacemaker 04/25/2016   Red blood cell antibody positive    K   Seizures (HCC)    Subdural hematoma (HCC)    SYNCOPE 07/01/2007   Wheezing 08/20/2008   WOUND, OPEN, LEG, WITHOUT COMPLICATION 08/16/2007   Past Surgical History:  Procedure Laterality Date   ABDOMINAL HYSTERECTOMY     APPENDECTOMY     BREAST RECONSTRUCTION     CARDIAC ELECTROPHYSIOLOGY MAPPING AND ABLATION     CESAREAN SECTION     CHOLECYSTECTOMY     COLON RESECTION  2005   COLON SURGERY  06/2020   COLONOSCOPY W/ BIOPSIES     FLEXIBLE SIGMOIDOSCOPY     LEFT AND RIGHT HEART CATHETERIZATION WITH CORONARY ANGIOGRAM N/A 10/07/2012   Procedure: LEFT AND RIGHT HEART CATHETERIZATION WITH CORONARY ANGIOGRAM;  Surgeon: Toribio JONELLE Fuel, MD;  Location: [facility] CATH [facility];  Service: Cardiovascular;  Laterality: N/A;   MASTECTOMY Bilateral    MINIMALLY INVASIVE TRICUSPID VALVE REPAIR     MITRAL VALVE REPAIR  2017   MITRAL VALVE REPLACEMENT     OOPHORECTOMY     SPIGELIAN HERNIA      reports that she has never smoked. She has never been exposed to tobacco smoke. She has  never used smokeless tobacco. She reports that she does not currently use alcohol. She reports that she does not use drugs. family history includes Diabetes in her son; Heart disease in her mother; Melanoma in her father; Ovarian cancer in her sister; Pancreatic cancer in her father; Uterine cancer in her mother. Allergies  Allergen Reactions   Fish Allergy  Anaphylaxis and Swelling    Scaled fish, like flounder   Penicillins Anaphylaxis, Hives and Rash   Levofloxacin  In D5w Nausea And Vomiting and Swelling    Causes throat swelling   Nitroglycerin  Other (See Comments)    Loss of consciousness    Pneumovax [Pneumococcal Polysaccharide Vaccine] Other (See Comments)    Rash, arm swelling   Sulfamethoxazole      Other reaction(s): GI bleeding Other reaction(s): Blood  Disorder   Latex Dermatitis and Rash    If worn for more than a day, this causes blisters    Current Outpatient Medications on File Prior to Visit  Medication Sig Dispense Refill   acetaminophen -codeine  (TYLENOL  #3) 300-30 MG tablet Take 1 tablet by mouth every 6 (six) hours as needed. 120 tablet 0   albuterol  (VENTOLIN  HFA) 108 (90 Base) MCG/ACT inhaler Inhale 2 puffs into the lungs every 6 (six) hours as needed for wheezing or shortness of breath. 8 g 11   cefdinir  (OMNICEF ) 300 MG capsule Take 1 capsule (300 mg total) by mouth 2 (two) times daily. 20 capsule 0   cetirizine  (ZYRTEC  ALLERGY ) 10 MG tablet Take 1 tablet (10 mg total) by mouth 2 (two) times daily. 60 tablet 5   EPINEPHrine  (EPIPEN  2-PAK) 0.3 mg/0.3 mL IJ SOAJ injection Inject 0.3 mg into the muscle as needed for anaphylaxis. 1 each 2   famotidine  (PEPCID ) 20 MG tablet Take 1 tablet (20 mg total) by mouth 2 (two) times daily. 60 tablet 5   furosemide  (LASIX ) 20 MG tablet Take 20 mg by mouth daily.     levothyroxine  (SYNTHROID ) 75 MCG tablet Take 1 tablet (75 mcg total) by mouth daily. 90 tablet 3   metoprolol  succinate (TOPROL -XL) 50 MG 24 hr tablet Take 50 mg by mouth daily.     Multiple Vitamins-Minerals (ZINC PO) Take 1 capsule by mouth daily.     ondansetron  (ZOFRAN ) 4 MG tablet Take 1 tablet (4 mg total) by mouth every 8 (eight) hours as needed for nausea or vomiting. 30 tablet 0   ondansetron  (ZOFRAN -ODT) 8 MG disintegrating tablet Take 1 tablet (8 mg total) by mouth every 8 (eight) hours as needed for nausea. 30 tablet 0   predniSONE  (DELTASONE ) 10 MG tablet 3 tabs by mouth per day for 3 days,2tabs per day for 3 days,1tab per day for 3 days 18 tablet 0   venlafaxine  XR (EFFEXOR -XR) 150 MG 24 hr capsule TAKE 1 CAPSULE(150 MG) BY MOUTH DAILY 90 capsule 3   vitamin C (ASCORBIC ACID) 500 MG tablet Take 500 mg by mouth daily.      VITAMIN D  PO Take 1 tablet by mouth daily.     warfarin (COUMADIN ) 10 MG tablet Take 1 tablet  daily or as directed by anticoagulation clinic 90 tablet 0   warfarin (COUMADIN ) 5 MG tablet Take 5-10 mg by mouth daily. Take 5mg  by mouth on SAT MON WED FRI in the evening, then take 10mg  on SUN TUE THU.     zolpidem  (AMBIEN ) 10 MG tablet TAKE 1 TABLET BY MOUTH EVERY DAY AT BEDTIME AS NEEDED 90 tablet 1   zonisamide  (ZONEGRAN ) 100 MG capsule  TAKE 4 CAPSULES BY MOUTH EVERY NIGHT 360 capsule 2   No current facility-administered medications on file prior to visit.        ROS:  All others reviewed and negative.  Objective        PE:  BP (!) 136/92   Pulse 60   Ht 5' 9 (1.753 m)   SpO2 96%   BMI 33.08 kg/m                 Constitutional: Pt appears in NAD               HENT: Head: NCAT.                Right Ear: External ear normal.                 Left Ear: External ear normal. Bilat tm's with mild erythema.  Max sinus areas mild tender.  Pharynx with mild erythema, no exudate               Eyes: . Pupils are equal, round, and reactive to light. Conjunctivae and EOM are normal               Nose: without d/c or deformity               Neck: Neck supple. Gross normal ROM               Cardiovascular: Normal rate and regular rhythm.                 Pulmonary/Chest: Effort normal and breath sounds without rales or wheezing.                Abd:  Soft, NT, ND, + BS, no organomegaly               Neurological: Pt is alert. At baseline orientation, motor grossly intact               Skin: Skin is warm. No rashes, no other new lesions, LE edema - none               Psychiatric: Pt behavior is normal without agitation   Micro: none  Cardiac tracings I have personally interpreted today:  none  Pertinent Radiological findings (summarize): none   Lab Results  Component Value Date   WBC 9.3 02/09/2023   HGB 13.9 02/09/2023   HCT 40.7 02/09/2023   PLT 319.0 02/09/2023   GLUCOSE 105 (H) 02/09/2023   CHOL 219 (H) 06/29/2022   TRIG 320.0 (H) 06/29/2022   HDL 41.00 06/29/2022   LDLDIRECT  141.0 06/29/2022   LDLCALC 73 02/11/2008   ALT 20 06/29/2022   AST 20 06/29/2022   NA 142 02/09/2023   K 3.7 02/09/2023   CL 107 02/09/2023   CREATININE 0.97 02/09/2023   BUN 17 02/09/2023   CO2 26 02/09/2023   TSH 5.440 (H) 03/09/2023   INR 2.5 (H) 04/29/2021   HGBA1C 5.3 06/29/2022   Assessment/Plan:  MASIEL GENTZLER is a 68 y.o. White or Caucasian [1] female with  has a past medical history of Abdominal pain, epigastric (10/25/2007), ADD (01/20/2009), AKI (acute kidney injury) (HCC) (04/11/2016), ALLERGIC RHINITIS (10/15/2006), ANGIOEDEMA (08/12/2008), ANXIETY (10/15/2006), ASTHMATIC BRONCHITIS, ACUTE (01/27/2008), Bowel obstruction (HCC), Breast cancer (HCC), Cellulitis and abscess of leg, except foot (08/12/2007), CELLULITIS, FACE (12/21/2008), CHB (complete heart block) (HCC) (04/14/2016), CHEST PAIN (06/14/2007), Chronic anticoagulation (05/16/2016), Chronic sinus infection (04/12/2010), COLECTOMY, PARTIAL, WITH ANASTOMOSIS,  HX OF (10/15/2006), COLONIC POLYPS, HX OF (10/15/2006), CYST, OVARIAN NEC/NOS (10/15/2006), Dengue (02/22/2010), DVT, HX OF (10/15/2006), DYSAUTONOMIA (10/15/2006), Dysfunction of eustachian tube (05/26/2009), GI BLEEDING (04/04/2007), Glossitis (03/06/2008), HEADACHE, CHRONIC (04/04/2007), Heart murmur, Hematemesis (10/25/2007), HEMORRHOIDS (04/04/2007), HYPERLIPIDEMIA (12/12/2007), HYPERTENSION (10/15/2006), Hypertrophic cardiomyopathy (HCC), HYPOTHYROIDISM (03/06/2008), IBS (04/04/2007), MASTECTOMY, BILATERAL, HX OF (08/12/2009), NECK MASS (07/01/2007), NEOP, MALIGNANT, FEMALE BREAST NOS (10/15/2006), NEOP, MALIGNANT, THYMUS (10/15/2006), Ovarian cancer (HCC), Presence of cardiac pacemaker (04/25/2016), Red blood cell antibody positive, Seizures (HCC), Subdural hematoma (HCC), SYNCOPE (07/01/2007), Wheezing (08/20/2008), and WOUND, OPEN, LEG, WITHOUT COMPLICATION (08/16/2007).  Vitamin D  deficiency Last vitamin D  Lab Results  Component Value Date   VD25OH 24.44 (L) 06/29/2022   Low, to start  oral replacement   HLD (hyperlipidemia) Lab Results  Component Value Date   LDLCALC 73 02/11/2008   Stable, pt to continue low chol diet   Essential hypertension BP Readings from Last 3 Encounters:  08/01/23 (!) 136/92  06/06/23 (!) 144/86  04/06/23 138/84   Mild uncontrolled, likely reactive,, pt to continue medical treatment toprol  xl 50 qd   Acute upper respiratory infection Mild to mod, for antibx course  ceftin  250 bid, cough med prn,  to f/u any worsening symptoms or concerns  Followup: Return if symptoms worsen or fail to improve.  Lynwood Rush, MD 08/01/2023 7:47 PM [facility]

## 2023-08-01 NOTE — Assessment & Plan Note (Signed)
 Lab Results  Component Value Date   LDLCALC 73 02/11/2008   Stable, pt to continue low chol diet

## 2023-08-01 NOTE — Patient Instructions (Signed)
 Please take all new medication as prescribed - the antibiotic, and cough medicine as needed  Please continue all other medications as before, and refills have been done if requested.  Please have the pharmacy call with any other refills you may need.  Please keep your appointments with your specialists as you may have planned

## 2023-08-01 NOTE — Assessment & Plan Note (Signed)
Last vitamin D Lab Results  Component Value Date   VD25OH 24.44 (L) 06/29/2022   Low, to start oral replacement

## 2023-08-02 ENCOUNTER — Other Ambulatory Visit: Payer: Self-pay | Admitting: Internal Medicine

## 2023-08-02 NOTE — Telephone Encounter (Unsigned)
 Copied from CRM [id number]. Topic: Clinical - Medication Refill >> Aug 02, 2023  4:16 PM Paige D wrote: Medication:  acetaminophen -codeine  (TYLENOL  #3) 300-30 MG tablet (PT is out of medication)   Has the patient contacted their pharmacy? Yes (Agent: If no, request that the patient contact the pharmacy for the refill. If patient does not wish to contact the pharmacy document the reason why and proceed with request.) (Agent: If yes, when and what did the pharmacy advise?)  This is the patient's preferred pharmacy:  [facility] [address] [place], [place] - [address] AT [facility] [address] [name] [address] Phone: [phone] Fax: [phone]   Is this the correct pharmacy for this prescription? Yes If no, delete pharmacy and type the correct one.   Has the prescription been filled recently? No  Is the patient out of the medication? Yes  Has the patient been seen for an appointment in the last year OR does the patient have an upcoming appointment? Yes  Can we respond through MyChart? Yes  Agent: Please be advised that Rx refills may take up to 3 business days. We ask that you follow-up with your pharmacy.

## 2023-08-03 MED ORDER — ACETAMINOPHEN-CODEINE 300-30 MG PO TABS: 1.0000 | ORAL_TABLET | Freq: Four times a day (QID) | ORAL | 0 refills | Status: DC | PRN

## 2023-08-22 ENCOUNTER — Ambulatory Visit: Payer: Medicare Other | Admitting: Neurology

## 2023-09-04 ENCOUNTER — Telehealth: Payer: Self-pay

## 2023-09-04 ENCOUNTER — Other Ambulatory Visit: Payer: Self-pay | Admitting: Internal Medicine

## 2023-09-04 MED ORDER — ACETAMINOPHEN-CODEINE 300-30 MG PO TABS: 1.0000 | ORAL_TABLET | Freq: Four times a day (QID) | ORAL | 0 refills | Status: DC | PRN

## 2023-09-04 NOTE — Telephone Encounter (Signed)
 Copied from CRM #[id number]. Topic: Clinical - Medication Refill >> Sep 04, 2023 11:12 AM [name] wrote: Medication: acetaminophen -codeine  (TYLENOL  #3) 300-30 MG tablet  Has the patient contacted their pharmacy? No (Agent: If no, request that the patient contact the pharmacy for the refill. If patient does not wish to contact the pharmacy document the reason why and proceed with request.) (Agent: If yes, when and what did the pharmacy advise?)  This is the patient's preferred pharmacy:  [facility] [address] [place], [place] - [address] AT [facility] [address] [name] [place] [address] Phone: [phone] Fax: [phone]    Is this the correct pharmacy for this prescription? Yes If no, delete pharmacy and type the correct one.   Has the prescription been filled recently? No  Is the patient out of the medication? Yes  Has the patient been seen for an appointment in the last year OR does the patient have an upcoming appointment? Yes  Can we respond through MyChart? No  Agent: Please be advised that Rx refills may take up to 3 business days. We ask that you follow-up with your pharmacy.

## 2023-09-04 NOTE — Telephone Encounter (Signed)
 Copied from CRM #[id number]. Topic: Clinical - Medication Question >> Sep 04, 2023 11:14 AM [name] wrote: Reason for CRM: Patient would like some cough medicine prescribe because she has a bad cough

## 2023-09-06 MED ORDER — PROMETHAZINE-DM 6.25-15 MG/5ML PO SYRP: 5.0000 mL | ORAL_SOLUTION | Freq: Four times a day (QID) | ORAL | 0 refills | Status: AC | PRN

## 2023-09-06 NOTE — Telephone Encounter (Signed)
 Ok this is done

## 2023-09-06 NOTE — Addendum Note (Signed)
 Addended by: NORLEEN LYNWOOD ORN on: 09/06/2023 11:37 AM   Modules accepted: Orders

## 2023-09-07 NOTE — Telephone Encounter (Signed)
 Called and informed patient that prescription had been placed. They expressed thanks  and understanding

## 2023-09-11 ENCOUNTER — Ambulatory Visit (INDEPENDENT_AMBULATORY_CARE_PROVIDER_SITE_OTHER): Admitting: Internal Medicine

## 2023-09-11 ENCOUNTER — Encounter: Payer: Self-pay | Admitting: Internal Medicine

## 2023-09-11 ENCOUNTER — Other Ambulatory Visit: Payer: Self-pay

## 2023-09-11 VITALS — BP 130/80 | HR 62 | Temp 98.1°F | Wt 222.6 lb

## 2023-09-11 DIAGNOSIS — L501 Idiopathic urticaria: Secondary | ICD-10-CM

## 2023-09-11 DIAGNOSIS — T781XXD Other adverse food reactions, not elsewhere classified, subsequent encounter: Secondary | ICD-10-CM | POA: Diagnosis not present

## 2023-09-11 MED ORDER — FAMOTIDINE 20 MG PO TABS: 20.0000 mg | ORAL_TABLET | Freq: Two times a day (BID) | ORAL | 5 refills | Status: AC

## 2023-09-11 MED ORDER — CETIRIZINE HCL 10 MG PO TABS: 10.0000 mg | ORAL_TABLET | Freq: Two times a day (BID) | ORAL | 5 refills | Status: AC

## 2023-09-11 NOTE — Progress Notes (Signed)
 FOLLOW UP Date of Service/Encounter:  09/11/23   Subjective:  [name] (DOB: 10-25-1955) is a 68 y.o. female who [facility] for follow up for allergic reactions, thought to be idiopathic urticaria/angioedema and food reactions.   History obtained from: chart review and patient. Last seen 04/06/2023 and at the time, reported no further hives but possible angioedema.  Discussed possibly anxiety contributing.  Uptitarted Zyrtec  and Pepcid  to BID.  Also avoiding fish and shellfish but sIgE to those was negative.   Reports having recurrent anaphylaxis multiple times since last visit.  Only symptom is break out of red itchy hives.   Once it occurred after eating at a restaurant 30 minutes later, worried about seafood cross contamination.  Another time after hugging a puppy at friends house that eats salmon.  Several other times with unclear seafood exposure.  Taking Zyrtec  10mg  daily, stopped Pepcid .  Under a lot of stress- undergoing house renovation due to mold and son has seizure disorder.  Past Medical History: Past Medical History:  Diagnosis Date   Abdominal pain, epigastric 10/25/2007   ADD 01/20/2009   AKI (acute kidney injury) (HCC) 04/11/2016   ALLERGIC RHINITIS 10/15/2006   ANGIOEDEMA 08/12/2008   ANXIETY 10/15/2006   ASTHMATIC BRONCHITIS, ACUTE 01/27/2008   Bowel obstruction (HCC)    Breast cancer (HCC)    Cellulitis and abscess of leg, except foot 08/12/2007   CELLULITIS, FACE 12/21/2008   CHB (complete heart block) (HCC) 04/14/2016   CHEST PAIN 06/14/2007   Chronic anticoagulation 05/16/2016   Chronic sinus infection 04/12/2010   COLECTOMY, PARTIAL, WITH ANASTOMOSIS, HX OF 10/15/2006   COLONIC POLYPS, HX OF 10/15/2006   CYST, OVARIAN NEC/NOS 10/15/2006   Dengue 02/22/2010   DVT, HX OF 10/15/2006   DYSAUTONOMIA 10/15/2006   Dysfunction of eustachian tube 05/26/2009   GI BLEEDING 04/04/2007   Glossitis 03/06/2008   HEADACHE, CHRONIC  04/04/2007   Heart murmur    Hematemesis 10/25/2007   HEMORRHOIDS 04/04/2007   HYPERLIPIDEMIA 12/12/2007   HYPERTENSION 10/15/2006   Hypertrophic cardiomyopathy (HCC)    HYPOTHYROIDISM 03/06/2008   IBS 04/04/2007   MASTECTOMY, BILATERAL, HX OF 08/12/2009   NECK MASS 07/01/2007   NEOP, MALIGNANT, FEMALE BREAST NOS 10/15/2006   NEOP, MALIGNANT, 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   Ovarian cancer (HCC)    Presence of cardiac pacemaker 04/25/2016   Red blood cell antibody positive    K   Seizures (HCC)    Subdural hematoma (HCC)    SYNCOPE 07/01/2007   Wheezing 08/20/2008   WOUND, OPEN, LEG, WITHOUT COMPLICATION 08/16/2007    Objective:  BP 130/80 (BP Location: Left Arm, Patient Position: Sitting, Cuff Size: Large)   Pulse 62   Temp 98.1 F (36.7 C) (Temporal)   Wt 222 lb 9.6 oz (101 kg)   SpO2 94%   BMI 32.87 kg/m  Body mass index is 32.87 kg/m. Physical Exam: GEN: alert, well developed HEENT: clear conjunctiva, nose with slight clear rhinorrhea  HEART: regular rate and rhythm, no murmur LUNGS: clear to auscultation bilaterally, no coughing, unlabored respiration SKIN: no rashes or lesions  Assessment:   1. Idiopathic urticaria   2. Adverse food reaction, subsequent encounter     Plan/Recommendations:  Allergic Reactions Idiopathic Urticaria/Angioedema:  - Discussed based on history this is likely idiopathic urticaria/angioedema rather than anaphylaxis. I do think stress/anxiety are contributing.  We discussed this is not related to seafood as her testing in the past for seafood has been negative.   -  At this time etiology of hives and swelling is unknown. Hives can be caused by a variety of different triggers including illness/infection, pressure, vibrations, extremes of temperature to name a few however majority of the time there is no identifiable trigger.  - 03/2023: negative alpha gal, ANA, CU index; normal tryptase, slightly elevated TSH, her thyroid  med dose adjusted by PCP.  -Start Zyrtec   10mg  twice daily and Pepcid  20mg  twice daily. - Consider Xolair if hives remain uncontrolled.   Food Reactions:  - please strictly avoid shellfish and fish.  - sIgE 03/2023: negative to fish and shellfish.  - for SKIN only reaction, okay to take Benadryl  25mg  capsules every 6 hours as needed - for SKIN + ANY additional symptoms, OR IF concern for LIFE THREATENING reaction = Epipen  Autoinjector EpiPen  0.3 mg. - If using Epinephrine  autoinjector, call 911        Return in about 3 months (around 12/11/2023).  Arleta Blanch, MD [facility] 

## 2023-09-11 NOTE — Patient Instructions (Addendum)
 Idiopathic Urticaria/Angioedema:  - At this time etiology of hives and swelling is unknown. Hives can be caused by a variety of different triggers including illness/infection, stress, pressure, vibrations, extremes of temperature to name a few however majority of the time there is no identifiable trigger.  -Start Zyrtec  10mg  twice daily and Pepcid  20mg  twice daily. - Consider Xolair shot if hives remain uncontrolled.    Food Allergy :  - please strictly avoid shellfish and fish.  - for SKIN only reaction, okay to take Benadryl  25mg  capsules every 6 hours as needed - for SKIN + ANY additional symptoms, OR IF concern for LIFE THREATENING reaction = Epipen  Autoinjector EpiPen  0.3 mg. - If using Epinephrine  autoinjector, call 911

## 2023-09-26 ENCOUNTER — Other Ambulatory Visit: Payer: Self-pay | Admitting: Neurology

## 2023-09-26 DIAGNOSIS — R55 Syncope and collapse: Secondary | ICD-10-CM

## 2023-09-26 DIAGNOSIS — R4689 Other symptoms and signs involving appearance and behavior: Secondary | ICD-10-CM

## 2023-10-03 ENCOUNTER — Other Ambulatory Visit: Payer: Self-pay | Admitting: Internal Medicine

## 2023-10-03 MED ORDER — ACETAMINOPHEN-CODEINE 300-30 MG PO TABS: 1.0000 | ORAL_TABLET | Freq: Four times a day (QID) | ORAL | 0 refills | Status: DC | PRN

## 2023-10-03 NOTE — Telephone Encounter (Signed)
 Copied from CRM [id number]. Topic: Clinical - Medication Refill >> Oct 03, 2023  9:53 AM Franky GRADE wrote: Medication: acetaminophen -codeine  (TYLENOL  #3) 300-30 MG tablet [[id number]]  Has the patient contacted their pharmacy? Yes, asked patient to contact the provider's office.  (Agent: If no, request that the patient contact the pharmacy for the refill. If patient does not wish to contact the pharmacy document the reason why and proceed with request.) (Agent: If yes, when and what did the pharmacy advise?)  This is the patient's preferred pharmacy:  [facility] [address] [place], [place] - [address] AT [facility] [address] [name] [address] Phone: [phone] Fax: [phone]   Is this the correct pharmacy for this prescription? Yes If no, delete pharmacy and type the correct one.   Has the prescription been filled recently? No  Is the patient out of the medication? Yes, patient is unable to eat without this medication as she only has half of her colon.   Has the patient been seen for an appointment in the last year OR does the patient have an upcoming appointment? Yes  Can we respond through MyChart? Yes  Agent: Please be advised that Rx refills may take up to 3 business days. We ask that you follow-up with your pharmacy.

## 2023-11-05 ENCOUNTER — Ambulatory Visit: Payer: Self-pay

## 2023-11-05 ENCOUNTER — Other Ambulatory Visit: Payer: Self-pay | Admitting: Internal Medicine

## 2023-11-05 NOTE — Telephone Encounter (Signed)
 FYI Only or Action Required?: FYI only for provider: appointment scheduled on  .  Patient was last seen in primary care on 08/01/2023 by Norleen Lynwood ORN, MD.  Called Nurse Triage reporting Otalgia.  Symptoms began x 2 days .  Interventions attempted: OTC medications: Tylenol .  Symptoms are: unchanged.  Triage Disposition: See Physician Within 24 Hours  Patient/caregiver understands and will follow disposition?: Yes  **See note below**      Copied from CRM #[id number]. Topic: Clinical - Red Word Triage >> Nov 05, 2023 12:06 PM Rea C wrote: Red Word that prompted transfer to Nurse Triage: Ear infection right ear, fever comes and goes, swollen glands symptoms began over the weekend Reason for Disposition  Earache  (Exceptions: Brief ear pain of lasting less than 60 minutes, or earache occurring during air travel.)  Answer Assessment - Initial Assessment Questions 1. LOCATION: Which ear is involved?     Right ear   2. ONSET: When did the ear pain start?      X 2 days   3. SEVERITY: How bad is the pain?  (Scale 1-10; mild, moderate or severe)     7/10  4. URI SYMPTOMS: Do you have a runny nose or cough?     Runny nose   5. FEVER: Do you have a fever? If Yes, ask: What is your temperature, how was it measured, and when did it start?     Not currently  6. CAUSE: Have you been swimming recently?, How often do you use Q-TIPS?, Have you had any recent air travel or scuba diving?     No   7. OTHER SYMPTOMS: Do you have any other symptoms? (e.g., decreased hearing, dizziness, headache, stiff neck, vomiting)  Dizziness  x 2 days-mild    Patient called in with complaints of fever that comes and goes, swollen glands, and right ear pain. She is taking Tylenol  for the pain. Appt. Scheduled for 11/7 as she only wishes to see Dr. Norleen; no other provider. She was added to the wait list as well.  Protocols used: Rilla

## 2023-11-05 NOTE — Telephone Encounter (Unsigned)
 Copied from CRM #[id number]. Topic: Clinical - Medication Refill >> Nov 05, 2023  4:10 PM Lauren C wrote: Medication: acetaminophen -codeine  (TYLENOL  #3) 300-30 MG tablet  Has the patient contacted their pharmacy? Yes Needs new rx, controlled  This is the patient's preferred pharmacy:  [facility] [address] [place], [place] - [address] AT [facility] [address] [name] [address] Phone: [phone] Fax: [phone]  Is this the correct pharmacy for this prescription? Yes If no, delete pharmacy and type the correct one.   Has the prescription been filled recently? Yes  Is the patient out of the medication? Yes  Has the patient been seen for an appointment in the last year OR does the patient have an upcoming appointment? Yes  Can we respond through MyChart? Yes  Agent: Please be advised that Rx refills may take up to 3 business days. We ask that you follow-up with your pharmacy.

## 2023-11-06 ENCOUNTER — Telehealth: Payer: Self-pay

## 2023-11-06 MED ORDER — ACETAMINOPHEN-CODEINE 300-30 MG PO TABS: 1.0000 | ORAL_TABLET | Freq: Four times a day (QID) | ORAL | 0 refills | Status: DC | PRN

## 2023-11-06 NOTE — Addendum Note (Signed)
 Addended by: NORLEEN LYNWOOD ORN on: 11/06/2023 04:40 PM   Modules accepted: Orders

## 2023-11-06 NOTE — Telephone Encounter (Signed)
 Copied from CRM #[id number]. Topic: Clinical - Medication Question >> Nov 06, 2023  3:06 PM Aisha D wrote: Reason for CRM: Pt is calling to request that the refill for the acetaminophen -codeine  (TYLENOL  #3) 300-30 MG tablet gets approved today. Pt stated that she is unable to eat without taking the medication and would like a callback with an update.

## 2023-11-06 NOTE — Telephone Encounter (Signed)
 Ok this is done

## 2023-11-08 ENCOUNTER — Encounter: Payer: Self-pay | Admitting: Internal Medicine

## 2023-11-08 ENCOUNTER — Ambulatory Visit: Admitting: Internal Medicine

## 2023-11-08 VITALS — BP 126/78 | HR 64 | Temp 98.1°F | Ht 69.0 in

## 2023-11-08 DIAGNOSIS — I10 Essential (primary) hypertension: Secondary | ICD-10-CM | POA: Diagnosis not present

## 2023-11-08 DIAGNOSIS — H6691 Otitis media, unspecified, right ear: Secondary | ICD-10-CM

## 2023-11-08 DIAGNOSIS — J309 Allergic rhinitis, unspecified: Secondary | ICD-10-CM

## 2023-11-08 DIAGNOSIS — E559 Vitamin D deficiency, unspecified: Secondary | ICD-10-CM | POA: Diagnosis not present

## 2023-11-08 MED ORDER — CEFUROXIME AXETIL 250 MG PO TABS: 250.0000 mg | ORAL_TABLET | Freq: Two times a day (BID) | ORAL | 1 refills | Status: DC

## 2023-11-08 NOTE — Progress Notes (Signed)
 Patient ID: [name], female   DOB: December 15, 1955, 68 y.o.   MRN: [id number]        Chief Complaint: follow up right otitis media, low vit d, htn, allergies       HPI:  [name] is a 68 y.o. female here with 3 days onset right ear pain and pressure, without vertigo but with somewhat muffled hearing and feverish.  Has hx of recurrent infections, has seen ENT.  Does have several wks ongoing nasal allergy  symptoms with clearish congestion, itch and sneezing, without fever, pain, ST, cough, swelling or wheezing.  Pt denies chest pain, increased sob or doe, wheezing, orthopnea, PND, increased LE swelling, palpitations, dizziness or syncope.   Pt denies polydipsia, polyuria, or new focal neuro s/s.   Denies worsening depressive symptoms, suicidal ideation, or panic       Wt Readings from Last 3 Encounters:  09/11/23 222 lb 9.6 oz (101 kg)  04/06/23 224 lb (101.6 kg)  03/09/23 224 lb 12.8 oz (102 kg)   BP Readings from Last 3 Encounters:  11/08/23 126/78  09/11/23 130/80  08/01/23 (!) 136/92         Past Medical History:  Diagnosis Date   Abdominal pain, epigastric 10/25/2007   ADD 01/20/2009   AKI (acute kidney injury) 04/11/2016   ALLERGIC RHINITIS 10/15/2006   ANGIOEDEMA 08/12/2008   ANXIETY 10/15/2006   ASTHMATIC BRONCHITIS, ACUTE 01/27/2008   Bowel obstruction (HCC)    Breast cancer (HCC)    Cellulitis and abscess of leg, except foot 08/12/2007   CELLULITIS, FACE 12/21/2008   CHB (complete heart block) (HCC) 04/14/2016   CHEST PAIN 06/14/2007   Chronic anticoagulation 05/16/2016   Chronic sinus infection 04/12/2010   COLECTOMY, PARTIAL, WITH ANASTOMOSIS, HX OF 10/15/2006   COLONIC POLYPS, HX OF 10/15/2006   CYST, OVARIAN NEC/NOS 10/15/2006   Dengue 02/22/2010   DVT, HX OF 10/15/2006   DYSAUTONOMIA 10/15/2006   Dysfunction of eustachian tube 05/26/2009   GI BLEEDING 04/04/2007   Glossitis 03/06/2008   HEADACHE, CHRONIC 04/04/2007   Heart murmur    Hematemesis 10/25/2007    HEMORRHOIDS 04/04/2007   HYPERLIPIDEMIA 12/12/2007   HYPERTENSION 10/15/2006   Hypertrophic cardiomyopathy (HCC)    HYPOTHYROIDISM 03/06/2008   IBS 04/04/2007   MASTECTOMY, BILATERAL, HX OF 08/12/2009   NECK MASS 07/01/2007   NEOP, MALIGNANT, FEMALE BREAST NOS 10/15/2006   NEOP, MALIGNANT, 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   Ovarian cancer (HCC)    Presence of cardiac pacemaker 04/25/2016   Red blood cell antibody positive    K   Seizures (HCC)    Subdural hematoma (HCC)    SYNCOPE 07/01/2007   Wheezing 08/20/2008   WOUND, OPEN, LEG, WITHOUT COMPLICATION 08/16/2007   Past Surgical History:  Procedure Laterality Date   ABDOMINAL HYSTERECTOMY     APPENDECTOMY     BREAST RECONSTRUCTION     CARDIAC ELECTROPHYSIOLOGY MAPPING AND ABLATION     CESAREAN SECTION     CHOLECYSTECTOMY     COLON RESECTION  2005   COLON SURGERY  06/2020   COLONOSCOPY W/ BIOPSIES     FLEXIBLE SIGMOIDOSCOPY     LEFT AND RIGHT HEART CATHETERIZATION WITH CORONARY ANGIOGRAM N/A 10/07/2012   Procedure: LEFT AND RIGHT HEART CATHETERIZATION WITH CORONARY ANGIOGRAM;  Surgeon: Toribio JONELLE Fuel, MD;  Location: [facility] CATH LAB;  Service: Cardiovascular;  Laterality: N/A;   MASTECTOMY Bilateral    MINIMALLY INVASIVE TRICUSPID VALVE REPAIR     MITRAL VALVE REPAIR  2017   MITRAL  VALVE REPLACEMENT     OOPHORECTOMY     SPIGELIAN HERNIA      reports that she has never smoked. She has never been exposed to tobacco smoke. She has never used smokeless tobacco. She reports that she does not currently use alcohol. She reports that she does not use drugs. family history includes Diabetes in her son; Heart disease in her mother; Melanoma in her father; Ovarian cancer in her sister; Pancreatic cancer in her father; Uterine cancer in her mother. Allergies  Allergen Reactions   Fish Allergy  Anaphylaxis and Swelling    Scaled fish, like flounder   Penicillins Anaphylaxis, Hives and Rash   Levofloxacin  In D5w Nausea And Vomiting and Swelling    Causes  throat swelling   Nitroglycerin  Other (See Comments)    Loss of consciousness    Pneumovax [Pneumococcal Polysaccharide Vaccine] Other (See Comments)    Rash, arm swelling   Sulfamethoxazole      Other reaction(s): GI bleeding Other reaction(s): Blood Disorder   Latex Dermatitis and Rash    If worn for more than a day, this causes blisters    Current Outpatient Medications on File Prior to Visit  Medication Sig Dispense Refill   acetaminophen -codeine  (TYLENOL  #3) 300-30 MG tablet Take 1 tablet by mouth every 6 (six) hours as needed. 120 tablet 0   albuterol  (VENTOLIN  HFA) 108 (90 Base) MCG/ACT inhaler Inhale 2 puffs into the lungs every 6 (six) hours as needed for wheezing or shortness of breath. 8 g 11   cefdinir  (OMNICEF ) 300 MG capsule Take 1 capsule (300 mg total) by mouth 2 (two) times daily. 20 capsule 0   cetirizine  (ZYRTEC  ALLERGY ) 10 MG tablet Take 1 tablet (10 mg total) by mouth 2 (two) times daily. 60 tablet 5   enoxaparin  (LOVENOX ) 100 MG/ML injection SMARTSIG:1 Milliliter(s) SUB-Q Every 12 Hours     EPINEPHrine  (EPIPEN  2-PAK) 0.3 mg/0.3 mL IJ SOAJ injection Inject 0.3 mg into the muscle as needed for anaphylaxis. 1 each 2   famotidine  (PEPCID ) 20 MG tablet Take 1 tablet (20 mg total) by mouth 2 (two) times daily. 60 tablet 5   furosemide  (LASIX ) 20 MG tablet Take 20 mg by mouth daily.     JARDIANCE 10 MG TABS tablet Take 10 mg by mouth daily.     levothyroxine  (SYNTHROID ) 75 MCG tablet Take 1 tablet (75 mcg total) by mouth daily. 90 tablet 3   metoprolol  succinate (TOPROL -XL) 50 MG 24 hr tablet Take 50 mg by mouth daily.     Multiple Vitamins-Minerals (ZINC PO) Take 1 capsule by mouth daily.     ondansetron  (ZOFRAN ) 4 MG tablet Take 1 tablet (4 mg total) by mouth every 8 (eight) hours as needed for nausea or vomiting. 30 tablet 0   ondansetron  (ZOFRAN -ODT) 8 MG disintegrating tablet Take 1 tablet (8 mg total) by mouth every 8 (eight) hours as needed for nausea. 30 tablet 0    predniSONE  (DELTASONE ) 10 MG tablet 3 tabs by mouth per day for 3 days,2tabs per day for 3 days,1tab per day for 3 days 18 tablet 0   promethazine -dextromethorphan (PROMETHAZINE -DM) 6.25-15 MG/5ML syrup Take 5 mLs by mouth 4 (four) times daily as needed. 118 mL 0   venlafaxine  XR (EFFEXOR -XR) 150 MG 24 hr capsule TAKE 1 CAPSULE(150 MG) BY MOUTH DAILY 90 capsule 3   vitamin C (ASCORBIC ACID) 500 MG tablet Take 500 mg by mouth daily.      VITAMIN D  PO Take 1 tablet by mouth  daily.     warfarin (COUMADIN ) 10 MG tablet Take 1 tablet daily or as directed by anticoagulation clinic 90 tablet 0   warfarin (COUMADIN ) 5 MG tablet Take 5-10 mg by mouth daily. Take 5mg  by mouth on SAT MON WED FRI in the evening, then take 10mg  on SUN TUE THU.     zolpidem  (AMBIEN ) 10 MG tablet TAKE 1 TABLET BY MOUTH EVERY DAY AT BEDTIME AS NEEDED (Patient taking differently: Take 10 mg by mouth as needed. TAKE 1 TABLET BY MOUTH EVERY DAY AT BEDTIME AS NEEDED) 90 tablet 1   zonisamide  (ZONEGRAN ) 100 MG capsule TAKE 4 CAPSULES BY MOUTH EVERY NIGHT 360 capsule 1   No current facility-administered medications on file prior to visit.        ROS:  All others reviewed and negative.  Objective        PE:  BP 126/78 (BP Location: Left Arm, Patient Position: Sitting, Cuff Size: Normal)   Pulse 64   Temp 98.1 F (36.7 C) (Oral)   Ht 5' 9 (1.753 m)   SpO2 98%   BMI 32.87 kg/m                 Constitutional: Pt appears in NAD               HENT: Head: NCAT.                Right Ear: External ear normal.  Right TM with severe erythema, effusion and mild bulging               Left Ear: External ear normal.                Eyes: . Pupils are equal, round, and reactive to light. Conjunctivae and EOM are normal               Nose: without d/c or deformity               Neck: Neck supple. Gross normal ROM               Cardiovascular: Normal rate and regular rhythm.                 Pulmonary/Chest: Effort normal and breath sounds  without rales or wheezing.                              Neurological: Pt is alert. At baseline orientation, motor grossly intact               Skin: Skin is warm. No rashes, no other new lesions, LE edema - none               Psychiatric: Pt behavior is normal without agitation   Micro: none  Cardiac tracings I have personally interpreted today:  none  Pertinent Radiological findings (summarize): none   Lab Results  Component Value Date   WBC 9.3 02/09/2023   HGB 13.9 02/09/2023   HCT 40.7 02/09/2023   PLT 319.0 02/09/2023   GLUCOSE 105 (H) 02/09/2023   CHOL 219 (H) 06/29/2022   TRIG 320.0 (H) 06/29/2022   HDL 41.00 06/29/2022   LDLDIRECT 141.0 06/29/2022   LDLCALC 73 02/11/2008   ALT 20 06/29/2022   AST 20 06/29/2022   NA 142 02/09/2023   K 3.7 02/09/2023   CL 107 02/09/2023   CREATININE 0.97 02/09/2023   BUN 17 02/09/2023  CO2 26 02/09/2023   TSH 5.440 (H) 03/09/2023   INR 2.5 (H) 04/29/2021   HGBA1C 5.3 06/29/2022   Assessment/Plan:  [name] is a 68 y.o. White or Caucasian [1] female with  has a past medical history of Abdominal pain, epigastric (10/25/2007), ADD (01/20/2009), AKI (acute kidney injury) (04/11/2016), ALLERGIC RHINITIS (10/15/2006), ANGIOEDEMA (08/12/2008), ANXIETY (10/15/2006), ASTHMATIC BRONCHITIS, ACUTE (01/27/2008), Bowel obstruction (HCC), Breast cancer (HCC), Cellulitis and abscess of leg, except foot (08/12/2007), CELLULITIS, FACE (12/21/2008), CHB (complete heart block) (HCC) (04/14/2016), CHEST PAIN (06/14/2007), Chronic anticoagulation (05/16/2016), Chronic sinus infection (04/12/2010), COLECTOMY, PARTIAL, WITH ANASTOMOSIS, HX OF (10/15/2006), COLONIC POLYPS, HX OF (10/15/2006), CYST, OVARIAN NEC/NOS (10/15/2006), Dengue (02/22/2010), DVT, HX OF (10/15/2006), DYSAUTONOMIA (10/15/2006), Dysfunction of eustachian tube (05/26/2009), GI BLEEDING (04/04/2007), Glossitis (03/06/2008), HEADACHE, CHRONIC (04/04/2007), Heart murmur, Hematemesis (10/25/2007), HEMORRHOIDS  (04/04/2007), HYPERLIPIDEMIA (12/12/2007), HYPERTENSION (10/15/2006), Hypertrophic cardiomyopathy (HCC), HYPOTHYROIDISM (03/06/2008), IBS (04/04/2007), MASTECTOMY, BILATERAL, HX OF (08/12/2009), NECK MASS (07/01/2007), NEOP, MALIGNANT, FEMALE BREAST NOS (10/15/2006), NEOP, MALIGNANT, THYMUS (10/15/2006), Ovarian cancer (HCC), Presence of cardiac pacemaker (04/25/2016), Red blood cell antibody positive, Seizures (HCC), Subdural hematoma (HCC), SYNCOPE (07/01/2007), Wheezing (08/20/2008), and WOUND, OPEN, LEG, WITHOUT COMPLICATION (08/16/2007).  Vitamin D  deficiency Last vitamin D  Lab Results  Component Value Date   VD25OH 24.44 (L) 06/29/2022   Low, to start oral replacement   Essential hypertension BP Readings from Last 3 Encounters:  11/08/23 126/78  09/11/23 130/80  08/01/23 (!) 136/92   Stable, pt to continue medical treatment toprol  xl 50 qd   Allergic rhinitis Mild to mod, for zyrtec  10 every day prn restart, to f/u any worsening symptoms or concerns  Right otitis media Mod to severe, for antibx course ceftin  250 bid,  to f/u any worsening symptoms or concerns  Followup: Return if symptoms worsen or fail to improve.  Lynwood Rush, MD 11/11/2023 9:56 AM [facility] - [facility] Internal Medicine

## 2023-11-08 NOTE — Patient Instructions (Signed)
 Please take all new medication as prescribed - the antibiotic  Please continue all other medications as before, and refills have been done if requested.  Please have the pharmacy call with any other refills you may need.  Please keep your appointments with your specialists as you may have planned

## 2023-11-09 ENCOUNTER — Ambulatory Visit: Admitting: Internal Medicine

## 2023-11-11 ENCOUNTER — Encounter: Payer: Self-pay | Admitting: Internal Medicine

## 2023-11-11 DIAGNOSIS — H6691 Otitis media, unspecified, right ear: Secondary | ICD-10-CM | POA: Insufficient documentation

## 2023-11-11 NOTE — Assessment & Plan Note (Signed)
Mild to mod, for zyrtec 10 every day prn restart, to f/u any worsening symptoms or concerns

## 2023-11-11 NOTE — Assessment & Plan Note (Signed)
Last vitamin D Lab Results  Component Value Date   VD25OH 24.44 (L) 06/29/2022   Low, to start oral replacement

## 2023-11-11 NOTE — Assessment & Plan Note (Signed)
 BP Readings from Last 3 Encounters:  11/08/23 126/78  09/11/23 130/80  08/01/23 (!) 136/92   Stable, pt to continue medical treatment toprol  xl 50 qd

## 2023-11-11 NOTE — Assessment & Plan Note (Signed)
 Mod to severe, for antibx course ceftin  250 bid,  to f/u any worsening symptoms or concerns

## 2023-11-15 ENCOUNTER — Encounter: Payer: Self-pay | Admitting: Neurology

## 2023-11-16 ENCOUNTER — Ambulatory Visit

## 2023-11-16 ENCOUNTER — Ambulatory Visit: Admitting: Internal Medicine

## 2023-11-19 ENCOUNTER — Telehealth: Payer: Self-pay | Admitting: Neurology

## 2023-11-19 NOTE — Telephone Encounter (Signed)
 Pt called no answer we need to know the pharmacy she uses . We do not refill coumadin 

## 2023-11-19 NOTE — Telephone Encounter (Signed)
 Pt called in this afternoon  and she stated that the pharmacy told her that  she did not have not any more refills on the prescription called:zonisamide  (ZONEGRAN ) 100 MG capsule . Pt stated that the pharmacy told her that the Dr. Irl to send in a request for that prescription. Pt also needs a refill on the prescription called warfarin (COUMADIN ) 5 MG tablet . Thanks

## 2023-11-20 NOTE — Telephone Encounter (Signed)
 Pt left a message with the after hours service on 11-19-23 at 5:32 pm   Spoke with patient and she uses the [facility] [address] with [place]

## 2023-11-20 NOTE — Telephone Encounter (Signed)
 Pharmacy called they are refilling her zonisamide 

## 2023-11-20 NOTE — Telephone Encounter (Signed)
 Pt called no answer we need to know the pharmacy she uses . We do not refill coumadin 

## 2023-12-05 ENCOUNTER — Telehealth: Payer: Self-pay | Admitting: Internal Medicine

## 2023-12-05 ENCOUNTER — Other Ambulatory Visit: Payer: Self-pay | Admitting: Internal Medicine

## 2023-12-05 ENCOUNTER — Telehealth: Payer: Self-pay

## 2023-12-05 MED ORDER — ACETAMINOPHEN-CODEINE 300-30 MG PO TABS: 1.0000 | ORAL_TABLET | Freq: Four times a day (QID) | ORAL | 0 refills | Status: DC | PRN

## 2023-12-05 NOTE — Telephone Encounter (Signed)
 Copied from CRM #[id number]. Topic: Clinical - Medication Refill >> Dec 05, 2023 10:50 AM Antwanette L wrote: Medication: acetaminophen -codeine  (TYLENOL  #3) 300-30 MG tablet  Has the patient contacted their pharmacy? Yes   This is the patient's preferred pharmacy:  [facility] [address] [place], [place] - [address] AT [facility] [address] [name] [address] Phone: [phone] Fax: [phone]    Is this the correct pharmacy for this prescription? Yes   Has the prescription been filled recently? Yes. Last refill was on 11/06/23  Is the patient out of the medication? Yes.  Has the patient been seen for an appointment in the last year OR does the patient have an upcoming appointment? Yes. Last ov(acute) with Dr. Norleen was on 11/08/23 and next appt is 01/16/24  Can we respond through MyChart? No. Contact the patient by phone at [phone]  Agent: Please be advised that Rx refills may take up to 3 business days. We ask that you follow-up with your pharmacy. >> Dec 05, 2023 10:56 AM Antwanette L wrote: Patient has a half of a colon.Patient is out of acetaminophen -codeine  (Tylenol  #3) 300-30 mg tablets and states she cannot eat without the medication.

## 2023-12-05 NOTE — Telephone Encounter (Signed)
 Ok for refill?

## 2023-12-05 NOTE — Telephone Encounter (Unsigned)
 Copied from CRM #[id number]. Topic: Clinical - Medication Refill >> Dec 05, 2023 10:50 AM Antwanette L wrote: Medication: acetaminophen -codeine  (TYLENOL  #3) 300-30 MG tablet  Has the patient contacted their pharmacy? Yes   This is the patient's preferred pharmacy:  [facility] [address] [place], [place] - [address] AT [facility] [address] [name] [address] Phone: [phone] Fax: [phone]    Is this the correct pharmacy for this prescription? Yes   Has the prescription been filled recently? Yes. Last refill was on 11/06/23  Is the patient out of the medication? Yes.  Has the patient been seen for an appointment in the last year OR does the patient have an upcoming appointment? Yes. Last ov(acute) with Dr. Norleen was on 11/08/23 and next appt is 01/16/24  Can we respond through MyChart? No. Contact the patient by phone at [phone]  Agent: Please be advised that Rx refills may take up to 3 business days. We ask that you follow-up with your pharmacy.

## 2023-12-11 ENCOUNTER — Ambulatory Visit: Admitting: Internal Medicine

## 2024-01-04 ENCOUNTER — Other Ambulatory Visit: Payer: Self-pay | Admitting: Internal Medicine

## 2024-01-04 NOTE — Telephone Encounter (Unsigned)
 Copied from CRM [id number]. Topic: Clinical - Medication Refill >> Jan 04, 2024  1:28 PM Lauren C wrote: Medication: acetaminophen -codeine  (TYLENOL  #3) 300-30 MG tablet *Pt is completely out*  Has the patient contacted their pharmacy? No  This is the patient's preferred pharmacy:  [facility] [address] [place], [place] - [address] AT [facility] [address] [name] [address] Phone: [phone] Fax: [phone]  Is this the correct pharmacy for this prescription? Yes If no, delete pharmacy and type the correct one.   Has the prescription been filled recently? Yes  Is the patient out of the medication? Yes  Has the patient been seen for an appointment in the last year OR does the patient have an upcoming appointment? Yes  Can we respond through MyChart? Yes  Agent: Please be advised that Rx refills may take up to 3 business days. We ask that you follow-up with your pharmacy.

## 2024-01-07 ENCOUNTER — Ambulatory Visit: Payer: Self-pay | Admitting: Internal Medicine

## 2024-01-07 ENCOUNTER — Ambulatory Visit: Payer: Self-pay

## 2024-01-07 MED ORDER — ACETAMINOPHEN-CODEINE 300-30 MG PO TABS: 1.0000 | ORAL_TABLET | Freq: Four times a day (QID) | ORAL | 0 refills | Status: DC | PRN

## 2024-01-07 NOTE — Telephone Encounter (Signed)
 Ok this refill is done

## 2024-01-07 NOTE — Telephone Encounter (Signed)
 FYI Only or Action Required?: Action required by provider: medication refill request.See refill encounter dated 01/04/24  Patient was last seen in primary care on 11/08/2023 by Norleen Lynwood ORN, MD.  Called Nurse Triage reporting Medication Refill.  Symptoms began 01/04/24 ran out of med, symptoms started that evening.  Interventions attempted: Dietary changes-self stopped PO food intake until med refill and Other: requested refill on 01/04/24  Symptoms are: rapidly worsening.  Triage Disposition: See HCP Within 4 Hours (Or PCP Triage)  Patient/caregiver understands and will follow disposition?: No, wishes to speak with PCP Reason for Disposition  [1] SEVERE diarrhea (e.g., 7 or more times / day more than normal) AND [2] age > 60 years  Answer Assessment - Initial Assessment Questions Additional info: Patient called back upset that her medication she requested for refill on 01/04/24 has not been sent to pharmacy, she called this morning to inquire and was informed high priority message was being sent to pcp, calling now because medication is still not at pharmacy.  HPI:  Patient reports she is maintained on Tylenol  #3 to be able to eat without developing diarrhea, she states she has a short colon and for some reason Tylenol  #3 prevents diarrhea. She reports she has been out of medication since 01/04/24 and has had a terrible weekend, she had up to 15 bouts of diarrhea over this weekend, with abdominal cramping. She is now not eating until she received her refill.  This clinical research associate called call to see if medication can be refilled today, was informed refill is pending pcp approval.  Advised patient to check with pharmacy before end of day and call back if no medication is sent or if symptoms worsen.   1. DIARRHEA SEVERITY: How bad is the diarrhea? How many more stools have you had in the past 24 hours than normal?     Up to 15 times this weekend 2. ONSET: When did the diarrhea begin?       Friday 3. STOOL DESCRIPTION:  How loose or watery is the diarrhea? What is the stool color? Is there any blood or mucous in the stool?     loose 4. VOMITING: Are you also vomiting? If Yes, ask: How many times in the past 24 hours?      denies 5. ABDOMEN PAIN: Are you having any abdomen pain? If Yes, ask: What does it feel like? (e.g., crampy, dull, intermittent, constant)      Cramping  6. ABDOMEN PAIN SEVERITY: If present, ask: How bad is the pain?  (e.g., Scale 1-10; mild, moderate, or severe)     Cramping-can be severe  7. ORAL INTAKE: If vomiting, Have you been able to drink liquids? How much liquids have you had in the past 24 hours?     Not vomiting  8. HYDRATION: Any signs of dehydration? (e.g., dry mouth [not just dry lips], too weak to stand, dizziness, new weight loss) When did you last urinate?     Not eating until she has her medicine.  9. EXPOSURE: Have you traveled to a foreign country recently? Have you been exposed to anyone with diarrhea? Could you have eaten any food that was spoiled?      10. ANTIBIOTIC USE: Are you taking antibiotics now or have you taken antibiotics in the past 2 months?        11. OTHER SYMPTOMS: Do you have any other symptoms? (e.g., fever, blood in stool)       cramps 12. PREGNANCY: Is there any  chance you are pregnant? When was your last menstrual period?  Protocols used: [facility] Copied from CRM [id number]. Topic: Clinical - Red Word Triage >> Jan 07, 2024  9:00 AM Darshell M wrote: Red Word that prompted transfer to Nurse Triage: Excessive diarrhea, patient with resected colon.  Normally takes tylenol  3 with meals to eat but prescription not refilled. Patient cramping and having 15 bouts of diarrhea daily without medication. >> Jan 07, 2024  2:28 PM Thersia C wrote: Patient calling back in , stated she is having a lot of pain, cramps and diarrhea and still hasn't got her medication yet, has been waiting on  that to be sent over to the pharmacy so she can eat, patient stated she has been able to eat

## 2024-01-07 NOTE — Telephone Encounter (Signed)
 FYI Only or Action Required?: Action required by provider: medication refill request.   Patient was last seen in primary care on 11/08/2023 by Norleen Lynwood ORN, MD.  Called Nurse Triage reporting Diarrhea.    Triage Disposition: Call PCP When Office is Open  Patient/caregiver understands and will follow disposition?: Yes        Copied from CRM [id number]. Topic: Clinical - Red Word Triage >> Jan 07, 2024  9:00 AM Darshell M wrote: Red Word that prompted transfer to Nurse Triage: Excessive diarrhea, patient with resected colon.  Normally takes tylenol  3 with meals to eat but prescription not refilled. Patient cramping and having 15 bouts of diarrhea daily without medication. Reason for Disposition  [1] Caller requesting NON-URGENT health information AND [2] PCP's office is the best resource  Answer Assessment - Initial Assessment Questions 1. REASON FOR CALL: What is the main reason for your call? or How can I best help you?  Patient called in on Friday 1/2  for refill of Tylenol  3. The refill request was processed that day. She says without the medication she gets severe diarrhea. She has cramps for the discomfort. Patient stated dr. Norleen is aware of this which is why she's on the medication. She would like the refill to be processed asap for relief of symptoms.  Protocols used: Information Only Call - No Triage-A-AH

## 2024-01-08 ENCOUNTER — Ambulatory Visit: Admitting: Internal Medicine

## 2024-01-08 NOTE — Telephone Encounter (Signed)
Medication has been refilled yesterday.

## 2024-01-10 ENCOUNTER — Ambulatory Visit

## 2024-01-10 NOTE — Telephone Encounter (Unsigned)
 Copied from CRM #[id number]. Topic: Clinical - Refused Triage >> Jan 10, 2024  8:57 AM Olam RAMAN wrote: Patient/caller voiced complaints of pt has had a l;ow grade fever for about a month, thought it was a bug. Has red cheeks. Declined transfer to triage.

## 2024-01-16 ENCOUNTER — Ambulatory Visit: Admitting: Internal Medicine

## 2024-01-16 ENCOUNTER — Ambulatory Visit

## 2024-01-16 ENCOUNTER — Encounter: Payer: Self-pay | Admitting: Internal Medicine

## 2024-01-16 VITALS — BP 156/98 | HR 64 | Temp 97.8°F | Ht 69.0 in

## 2024-01-16 DIAGNOSIS — R21 Rash and other nonspecific skin eruption: Secondary | ICD-10-CM

## 2024-01-16 DIAGNOSIS — I10 Essential (primary) hypertension: Secondary | ICD-10-CM | POA: Diagnosis not present

## 2024-01-16 DIAGNOSIS — R739 Hyperglycemia, unspecified: Secondary | ICD-10-CM

## 2024-01-16 DIAGNOSIS — E785 Hyperlipidemia, unspecified: Secondary | ICD-10-CM

## 2024-01-16 DIAGNOSIS — E611 Iron deficiency: Secondary | ICD-10-CM

## 2024-01-16 DIAGNOSIS — Z952 Presence of prosthetic heart valve: Secondary | ICD-10-CM | POA: Diagnosis not present

## 2024-01-16 DIAGNOSIS — Z7901 Long term (current) use of anticoagulants: Secondary | ICD-10-CM | POA: Diagnosis not present

## 2024-01-16 DIAGNOSIS — R509 Fever, unspecified: Secondary | ICD-10-CM

## 2024-01-16 DIAGNOSIS — R310 Gross hematuria: Secondary | ICD-10-CM

## 2024-01-16 DIAGNOSIS — E538 Deficiency of other specified B group vitamins: Secondary | ICD-10-CM

## 2024-01-16 LAB — BASIC METABOLIC PANEL WITH GFR
BUN: 13 mg/dL (ref 6–23)
CO2: 27 meq/L (ref 19–32)
Calcium: 9.3 mg/dL (ref 8.4–10.5)
Chloride: 105 meq/L (ref 96–112)
Creatinine, Ser: 0.93 mg/dL (ref 0.40–1.20)
GFR: 62.86 mL/min
Glucose, Bld: 93 mg/dL (ref 70–99)
Potassium: 3.7 meq/L (ref 3.5–5.1)
Sodium: 138 meq/L (ref 135–145)

## 2024-01-16 LAB — FERRITIN: Ferritin: 20.8 ng/mL (ref 10.0–291.0)

## 2024-01-16 LAB — IBC PANEL
Iron: 107 ug/dL (ref 42–145)
Saturation Ratios: 23.2 % (ref 20.0–50.0)
TIBC: 462 ug/dL — ABNORMAL HIGH (ref 250.0–450.0)
Transferrin: 330 mg/dL (ref 212.0–360.0)

## 2024-01-16 LAB — CBC WITH DIFFERENTIAL/PLATELET
Basophils Absolute: 0 K/uL (ref 0.0–0.1)
Basophils Relative: 0.6 % (ref 0.0–3.0)
Eosinophils Absolute: 0.2 K/uL (ref 0.0–0.7)
Eosinophils Relative: 3.8 % (ref 0.0–5.0)
HCT: 40.8 % (ref 36.0–46.0)
Hemoglobin: 13.9 g/dL (ref 12.0–15.0)
Lymphocytes Relative: 29.8 % (ref 12.0–46.0)
Lymphs Abs: 1.7 K/uL (ref 0.7–4.0)
MCHC: 34 g/dL (ref 30.0–36.0)
MCV: 85.3 fl (ref 78.0–100.0)
Monocytes Absolute: 0.3 K/uL (ref 0.1–1.0)
Monocytes Relative: 5.9 % (ref 3.0–12.0)
Neutro Abs: 3.5 K/uL (ref 1.4–7.7)
Neutrophils Relative %: 59.9 % (ref 43.0–77.0)
Platelets: 243 K/uL (ref 150.0–400.0)
RBC: 4.78 Mil/uL (ref 3.87–5.11)
RDW: 14.5 % (ref 11.5–15.5)
WBC: 5.9 K/uL (ref 4.0–10.5)

## 2024-01-16 LAB — HEMOGLOBIN A1C: Hgb A1c MFr Bld: 5.5 % (ref 4.6–6.5)

## 2024-01-16 LAB — LIPID PANEL
Cholesterol: 237 mg/dL — ABNORMAL HIGH (ref 28–200)
HDL: 45.1 mg/dL
LDL Cholesterol: 144 mg/dL — ABNORMAL HIGH (ref 10–99)
NonHDL: 191.46
Total CHOL/HDL Ratio: 5
Triglycerides: 237 mg/dL — ABNORMAL HIGH (ref 10.0–149.0)
VLDL: 47.4 mg/dL — ABNORMAL HIGH (ref 0.0–40.0)

## 2024-01-16 LAB — HEPATIC FUNCTION PANEL
ALT: 16 U/L (ref 3–35)
AST: 18 U/L (ref 5–37)
Albumin: 4.5 g/dL (ref 3.5–5.2)
Alkaline Phosphatase: 121 U/L — ABNORMAL HIGH (ref 39–117)
Bilirubin, Direct: 0 mg/dL — ABNORMAL LOW (ref 0.1–0.3)
Total Bilirubin: 0.5 mg/dL (ref 0.2–1.2)
Total Protein: 7.1 g/dL (ref 6.0–8.3)

## 2024-01-16 LAB — MICROALBUMIN / CREATININE URINE RATIO
Creatinine,U: 73 mg/dL
Microalb Creat Ratio: UNDETERMINED mg/g (ref 0.0–30.0)
Microalb, Ur: <0.7 mg/dL

## 2024-01-16 LAB — PROTIME-INR
INR: 2.5 ratio — ABNORMAL HIGH (ref 0.8–1.0)
Prothrombin Time: 24.9 s — ABNORMAL HIGH (ref 9.6–13.1)

## 2024-01-16 LAB — TSH: TSH: 4.26 u[IU]/mL (ref 0.35–5.50)

## 2024-01-16 LAB — VITAMIN B12: Vitamin B-12: 193 pg/mL — ABNORMAL LOW (ref 211–911)

## 2024-01-16 MED ORDER — VENLAFAXINE HCL ER 150 MG PO CP24: 150.0000 mg | ORAL_CAPSULE | Freq: Every day | ORAL | 3 refills | Status: AC

## 2024-01-16 MED ORDER — ZOLPIDEM TARTRATE 10 MG PO TABS: ORAL_TABLET | ORAL | 1 refills | Status: AC

## 2024-01-16 MED ORDER — LEVOTHYROXINE SODIUM 75 MCG PO TABS: 75.0000 ug | ORAL_TABLET | Freq: Every day | ORAL | 3 refills | Status: AC

## 2024-01-16 MED ORDER — ACETAMINOPHEN-CODEINE 300-30 MG PO TABS: 1.0000 | ORAL_TABLET | Freq: Four times a day (QID) | ORAL | 0 refills | Status: AC | PRN

## 2024-01-16 NOTE — Assessment & Plan Note (Signed)
 Etiology unclear, for INR due to coumadin , also UA and Cx, and refer urology

## 2024-01-16 NOTE — Assessment & Plan Note (Signed)
 Etiology unclear, for ANA, sed rate with labs

## 2024-01-16 NOTE — Patient Instructions (Signed)
 Please continue all other medications as before, and refills have been done if requested.  Please have the pharmacy call with any other refills you may need.  Please continue your efforts at being more active, low cholesterol diet, and weight control.  You are otherwise up to date with prevention measures today.  Please keep your appointments with your specialists as you may have planned  You will be contacted regarding the referral for: urology  Please go to the XRAY Department in the first floor for the x-ray testing  Please go to the LAB at the blood drawing area for the tests to be done  You will be contacted by phone if any changes need to be made immediately.  Otherwise, you will receive a letter about your results with an explanation, but please check with MyChart first.

## 2024-01-16 NOTE — Assessment & Plan Note (Signed)
 Volume stable, cont current med tx, f/u cardiology [facility]

## 2024-01-16 NOTE — Assessment & Plan Note (Signed)
 Lab Results  Component Value Date   LDLCALC 73 02/11/2008   Uncontrolled, mild , declines statin, for lipid panel follow up

## 2024-01-16 NOTE — Progress Notes (Signed)
 Patient ID: [name], female   DOB: 05-19-1955, 69 y.o.   MRN: [id number]        Chief Complaint: follow up feverish, htn, facial rash, chronic anticoagulation, gross hematuria, chf s/p MVR       HPI:  [name] is a 69 y.o. female here after unfortunate trip and fall with left wrist fracture now casted, here with 2 wks onset feverish feeling and lower stamina, myalgias, flushing vs other facial rash persistent, Pt denies chest pain, increased sob or doe, wheezing, orthopnea, PND, increased LE swelling, palpitations, dizziness.  , Pt denies polydipsia, polyuria, or new focal neuro s/s.  Pt denies recent wt loss, night sweats, loss of appetite, or other constitutional symptoms.  Denies urinary symptoms such as dysuria, frequency, urgency, flank pain, but does have persistent gross hematuria on coumadin  not yet addressed with urology       Wt Readings from Last 3 Encounters:  09/11/23 222 lb 9.6 oz (101 kg)  04/06/23 224 lb (101.6 kg)  03/09/23 224 lb 12.8 oz (102 kg)   BP Readings from Last 3 Encounters:  01/16/24 (!) 156/98  11/08/23 126/78  09/11/23 130/80         Past Medical History:  Diagnosis Date   Abdominal pain, epigastric 10/25/2007   ADD 01/20/2009   AKI (acute kidney injury) 04/11/2016   ALLERGIC RHINITIS 10/15/2006   ANGIOEDEMA 08/12/2008   ANXIETY 10/15/2006   ASTHMATIC BRONCHITIS, ACUTE 01/27/2008   Bowel obstruction (HCC)    Breast cancer (HCC)    Cellulitis and abscess of leg, except foot 08/12/2007   CELLULITIS, FACE 12/21/2008   CHB (complete heart block) (HCC) 04/14/2016   CHEST PAIN 06/14/2007   Chronic anticoagulation 05/16/2016   Chronic sinus infection 04/12/2010   COLECTOMY, PARTIAL, WITH ANASTOMOSIS, HX OF 10/15/2006   COLONIC POLYPS, HX OF 10/15/2006   CYST, OVARIAN NEC/NOS 10/15/2006   Dengue 02/22/2010   DVT, HX OF 10/15/2006   DYSAUTONOMIA 10/15/2006   Dysfunction of eustachian tube 05/26/2009   GI BLEEDING 04/04/2007   Glossitis 03/06/2008    HEADACHE, CHRONIC 04/04/2007   Heart murmur    Hematemesis 10/25/2007   HEMORRHOIDS 04/04/2007   HYPERLIPIDEMIA 12/12/2007   HYPERTENSION 10/15/2006   Hypertrophic cardiomyopathy (HCC)    HYPOTHYROIDISM 03/06/2008   IBS 04/04/2007   MASTECTOMY, BILATERAL, HX OF 08/12/2009   NECK MASS 07/01/2007   NEOP, MALIGNANT, FEMALE BREAST NOS 10/15/2006   NEOP, MALIGNANT, 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   Ovarian cancer (HCC)    Presence of cardiac pacemaker 04/25/2016   Red blood cell antibody positive    K   Seizures (HCC)    Subdural hematoma (HCC)    SYNCOPE 07/01/2007   Wheezing 08/20/2008   WOUND, OPEN, LEG, WITHOUT COMPLICATION 08/16/2007   Past Surgical History:  Procedure Laterality Date   ABDOMINAL HYSTERECTOMY     APPENDECTOMY     BREAST RECONSTRUCTION     CARDIAC ELECTROPHYSIOLOGY MAPPING AND ABLATION     CESAREAN SECTION     CHOLECYSTECTOMY     COLON RESECTION  2005   COLON SURGERY  06/2020   COLONOSCOPY W/ BIOPSIES     FLEXIBLE SIGMOIDOSCOPY     LEFT AND RIGHT HEART CATHETERIZATION WITH CORONARY ANGIOGRAM N/A 10/07/2012   Procedure: LEFT AND RIGHT HEART CATHETERIZATION WITH CORONARY ANGIOGRAM;  Surgeon: Toribio JONELLE Fuel, MD;  Location: [facility] CATH LAB;  Service: Cardiovascular;  Laterality: N/A;   MASTECTOMY Bilateral    MINIMALLY INVASIVE TRICUSPID VALVE REPAIR     MITRAL  VALVE REPAIR  2017   MITRAL VALVE REPLACEMENT     OOPHORECTOMY     SPIGELIAN HERNIA      reports that she has never smoked. She has never been exposed to tobacco smoke. She has never used smokeless tobacco. She reports that she does not currently use alcohol. She reports that she does not use drugs. family history includes Diabetes in her son; Heart disease in her mother; Melanoma in her father; Ovarian cancer in her sister; Pancreatic cancer in her father; Uterine cancer in her mother. Allergies[1] Medications Ordered Prior to Encounter[2]      ROS:  All others reviewed and negative.  Objective        PE:  BP (!) 156/98    Pulse 64   Temp 97.8 F (36.6 C) (Temporal)   Ht 5' 9 (1.753 m)   SpO2 97%   BMI 32.87 kg/m                 Constitutional: Pt appears fatigued, mild ill               HENT: Head: NCAT.                Right Ear: External ear normal.                 Left Ear: External ear normal.                Eyes: . Pupils are equal, round, and reactive to light. Conjunctivae and EOM are normal               Nose: without d/c or deformity               Neck: Neck supple. Gross normal ROM               Cardiovascular: Normal rate and regular rhythm.                 Pulmonary/Chest: Effort normal and breath sounds without rales or wheezing.                Abd:  Soft, NT, ND, + BS, no organomegaly               Neurological: Pt is alert. At baseline orientation, motor grossly intact               Skin: Skin is warm. LE edema - none, bilateral facies with nontender erythema over the maxillary sinus areas               Psychiatric: Pt behavior is normal without agitation   Micro: none  Cardiac tracings I have personally interpreted today:  none  Pertinent Radiological findings (summarize): none   Lab Results  Component Value Date   WBC 9.3 02/09/2023   HGB 13.9 02/09/2023   HCT 40.7 02/09/2023   PLT 319.0 02/09/2023   GLUCOSE 105 (H) 02/09/2023   CHOL 219 (H) 06/29/2022   TRIG 320.0 (H) 06/29/2022   HDL 41.00 06/29/2022   LDLDIRECT 141.0 06/29/2022   LDLCALC 73 02/11/2008   ALT 20 06/29/2022   AST 20 06/29/2022   NA 142 02/09/2023   K 3.7 02/09/2023   CL 107 02/09/2023   CREATININE 0.97 02/09/2023   BUN 17 02/09/2023   CO2 26 02/09/2023   TSH 5.440 (H) 03/09/2023   INR 2.5 (H) 04/29/2021   HGBA1C 5.3 06/29/2022   Assessment/Plan:  [name] is a 69 y.o. White  or Caucasian [1] female with  has a past medical history of Abdominal pain, epigastric (10/25/2007), ADD (01/20/2009), AKI (acute kidney injury) (04/11/2016), ALLERGIC RHINITIS (10/15/2006), ANGIOEDEMA (08/12/2008), ANXIETY  (10/15/2006), ASTHMATIC BRONCHITIS, ACUTE (01/27/2008), Bowel obstruction (HCC), Breast cancer (HCC), Cellulitis and abscess of leg, except foot (08/12/2007), CELLULITIS, FACE (12/21/2008), CHB (complete heart block) (HCC) (04/14/2016), CHEST PAIN (06/14/2007), Chronic anticoagulation (05/16/2016), Chronic sinus infection (04/12/2010), COLECTOMY, PARTIAL, WITH ANASTOMOSIS, HX OF (10/15/2006), COLONIC POLYPS, HX OF (10/15/2006), CYST, OVARIAN NEC/NOS (10/15/2006), Dengue (02/22/2010), DVT, HX OF (10/15/2006), DYSAUTONOMIA (10/15/2006), Dysfunction of eustachian tube (05/26/2009), GI BLEEDING (04/04/2007), Glossitis (03/06/2008), HEADACHE, CHRONIC (04/04/2007), Heart murmur, Hematemesis (10/25/2007), HEMORRHOIDS (04/04/2007), HYPERLIPIDEMIA (12/12/2007), HYPERTENSION (10/15/2006), Hypertrophic cardiomyopathy (HCC), HYPOTHYROIDISM (03/06/2008), IBS (04/04/2007), MASTECTOMY, BILATERAL, HX OF (08/12/2009), NECK MASS (07/01/2007), NEOP, MALIGNANT, FEMALE BREAST NOS (10/15/2006), NEOP, MALIGNANT, THYMUS (10/15/2006), Ovarian cancer (HCC), Presence of cardiac pacemaker (04/25/2016), Red blood cell antibody positive, Seizures (HCC), Subdural hematoma (HCC), SYNCOPE (07/01/2007), Wheezing (08/20/2008), and WOUND, OPEN, LEG, WITHOUT COMPLICATION (08/16/2007).  H/O mitral valve replacement with mechanical valve Volume stable, cont current med tx, f/u cardiology [facility]  Anticoagulated on Coumadin  With gross hematuria - for INR with labs  HLD (hyperlipidemia) Lab Results  Component Value Date   LDLCALC 73 02/11/2008   Uncontrolled, mild , declines statin, for lipid panel follow up   Gross hematuria Etiology unclear, for INR due to coumadin , also UA and Cx, and refer urology  Facial rash Etiology unclear, for ANA, sed rate with labs  Essential hypertension BP Readings from Last 3 Encounters:  01/16/24 (!) 156/98  11/08/23 126/78  09/11/23 130/80   Mild elevated, likely situational, pt to continue medical treatment toprol  xl 50  qd   Feels feverish Pt indeed does appear mild ill, feeling feverish for 2 wk without overt source of infection it seems, for UA and cx, but also CXR today, and tx pending resuts  Followup: Return if symptoms worsen or fail to improve.  Lynwood Rush, MD 01/16/2024 8:12 PM [facility]     [1]  Allergies Allergen Reactions   Fish Allergy  Anaphylaxis and Swelling    Scaled fish, like flounder   Penicillins Anaphylaxis, Hives and Rash   Levofloxacin  In D5w Nausea And Vomiting and Swelling    Causes throat swelling   Nitroglycerin  Other (See Comments)    Loss of consciousness    Pneumovax [Pneumococcal Polysaccharide Vaccine] Other (See Comments)    Rash, arm swelling   Sulfamethoxazole      Other reaction(s): GI bleeding Other reaction(s): Blood Disorder   Latex Dermatitis and Rash    If worn for more than a day, this causes blisters   [2]  Current Outpatient Medications on File Prior to Visit  Medication Sig Dispense Refill   albuterol  (VENTOLIN  HFA) 108 (90 Base) MCG/ACT inhaler Inhale 2 puffs into the lungs every 6 (six) hours as needed for wheezing or shortness of breath. 8 g 11   cetirizine  (ZYRTEC  ALLERGY ) 10 MG tablet Take 1 tablet (10 mg total) by mouth 2 (two) times daily. 60 tablet 5   enoxaparin  (LOVENOX ) 100 MG/ML injection SMARTSIG:1 Milliliter(s) SUB-Q Every 12 Hours     EPINEPHrine  (EPIPEN  2-PAK) 0.3 mg/0.3 mL IJ SOAJ injection Inject 0.3 mg into the muscle as needed for anaphylaxis. 1 each 2   famotidine  (PEPCID ) 20 MG tablet Take 1 tablet (20 mg total) by mouth 2 (two) times daily. 60 tablet 5   furosemide  (LASIX ) 20 MG tablet Take 20  mg by mouth daily.     JARDIANCE 10 MG TABS tablet Take 10 mg by mouth daily.     metoprolol  succinate (TOPROL -XL) 50 MG 24 hr tablet Take 50 mg by mouth daily.     Multiple Vitamins-Minerals (ZINC PO) Take 1 capsule by mouth daily.     ondansetron  (ZOFRAN -ODT) 8  MG disintegrating tablet Take 1 tablet (8 mg total) by mouth every 8 (eight) hours as needed for nausea. 30 tablet 0   promethazine -dextromethorphan (PROMETHAZINE -DM) 6.25-15 MG/5ML syrup Take 5 mLs by mouth 4 (four) times daily as needed. 118 mL 0   vitamin C (ASCORBIC ACID) 500 MG tablet Take 500 mg by mouth daily.      VITAMIN D  PO Take 1 tablet by mouth daily.     warfarin (COUMADIN ) 10 MG tablet Take 1 tablet daily or as directed by anticoagulation clinic 90 tablet 0   warfarin (COUMADIN ) 5 MG tablet Take 5-10 mg by mouth daily. Take 5mg  by mouth on SAT MON WED FRI in the evening, then take 10mg  on SUN TUE THU.     zonisamide  (ZONEGRAN ) 100 MG capsule TAKE 4 CAPSULES BY MOUTH EVERY NIGHT 360 capsule 1   No current facility-administered medications on file prior to visit.

## 2024-01-16 NOTE — Assessment & Plan Note (Signed)
 BP Readings from Last 3 Encounters:  01/16/24 (!) 156/98  11/08/23 126/78  09/11/23 130/80   Mild elevated, likely situational, pt to continue medical treatment toprol  xl 50 qd

## 2024-01-16 NOTE — Assessment & Plan Note (Signed)
 Pt indeed does appear mild ill, feeling feverish for 2 wk without overt source of infection it seems, for UA and cx, but also CXR today, and tx pending resuts

## 2024-01-16 NOTE — Assessment & Plan Note (Signed)
 With gross hematuria - for INR with labs

## 2024-01-17 ENCOUNTER — Ambulatory Visit: Payer: Self-pay | Admitting: Internal Medicine

## 2024-01-17 LAB — URINALYSIS, ROUTINE W REFLEX MICROSCOPIC
Bilirubin Urine: NEGATIVE
Hgb urine dipstick: NEGATIVE
Ketones, ur: NEGATIVE
Leukocytes,Ua: NEGATIVE
Nitrite: NEGATIVE
RBC / HPF: NONE SEEN
Specific Gravity, Urine: 1.01 (ref 1.000–1.030)
Total Protein, Urine: NEGATIVE
Urine Glucose: NEGATIVE
Urobilinogen, UA: 0.2 (ref 0.0–1.0)
pH: 6 (ref 5.0–8.0)

## 2024-01-19 LAB — CYCLIC CITRUL PEPTIDE ANTIBODY, IGG: Cyclic Citrullin Peptide Ab: <16 U

## 2024-01-19 LAB — ANTI-NUCLEAR AB-TITER (ANA TITER)
ANA TITER: 1:40 {titer} — ABNORMAL HIGH
ANA Titer 1: 1:80 {titer} — ABNORMAL HIGH

## 2024-01-19 LAB — ANA: Anti Nuclear Antibody (ANA): POSITIVE — AB

## 2024-01-19 LAB — URINE CULTURE: Result:: NO GROWTH

## 2024-01-19 LAB — SEDIMENTATION RATE: Sed Rate: 6 mm/h (ref 0–30)

## 2024-01-19 LAB — RHEUMATOID FACTOR: Rheumatoid fact SerPl-aCnc: <10 [IU]/mL

## 2024-01-20 ENCOUNTER — Other Ambulatory Visit: Payer: Self-pay | Admitting: Internal Medicine

## 2024-01-20 DIAGNOSIS — R7689 Other specified abnormal immunological findings in serum: Secondary | ICD-10-CM

## 2024-01-29 ENCOUNTER — Ambulatory Visit: Admitting: Internal Medicine

## 2024-02-01 ENCOUNTER — Ambulatory Visit: Admitting: Internal Medicine

## 2024-03-13 ENCOUNTER — Ambulatory Visit: Admitting: Neurology
